# Patient Record
Sex: Male | Born: 1970
Health system: Southern US, Community
[De-identification: ages and names within clinical notes are randomized; demographics above are authoritative.]

## PROBLEM LIST (undated history)

## (undated) DIAGNOSIS — E119 Type 2 diabetes mellitus without complications: Secondary | ICD-10-CM

## (undated) DIAGNOSIS — I2510 Atherosclerotic heart disease of native coronary artery without angina pectoris: Secondary | ICD-10-CM

## (undated) DIAGNOSIS — F32A Depression, unspecified: Secondary | ICD-10-CM

## (undated) DIAGNOSIS — R945 Abnormal results of liver function studies: Secondary | ICD-10-CM

## (undated) DIAGNOSIS — Z8679 Personal history of other diseases of the circulatory system: Secondary | ICD-10-CM

## (undated) DIAGNOSIS — Z7901 Long term (current) use of anticoagulants: Secondary | ICD-10-CM

## (undated) DIAGNOSIS — I5020 Unspecified systolic (congestive) heart failure: Secondary | ICD-10-CM

## (undated) DIAGNOSIS — Z7982 Long term (current) use of aspirin: Secondary | ICD-10-CM

## (undated) DIAGNOSIS — I480 Paroxysmal atrial fibrillation: Secondary | ICD-10-CM

## (undated) DIAGNOSIS — D649 Anemia, unspecified: Secondary | ICD-10-CM

## (undated) DIAGNOSIS — Z87891 Personal history of nicotine dependence: Secondary | ICD-10-CM

## (undated) DIAGNOSIS — F419 Anxiety disorder, unspecified: Secondary | ICD-10-CM

## (undated) DIAGNOSIS — E0500 Thyrotoxicosis with diffuse goiter without thyrotoxic crisis or storm: Secondary | ICD-10-CM

## (undated) DIAGNOSIS — I447 Left bundle-branch block, unspecified: Secondary | ICD-10-CM

## (undated) DIAGNOSIS — K219 Gastro-esophageal reflux disease without esophagitis: Secondary | ICD-10-CM

## (undated) DIAGNOSIS — M2041 Other hammer toe(s) (acquired), right foot: Secondary | ICD-10-CM

## (undated) DIAGNOSIS — R0600 Dyspnea, unspecified: Secondary | ICD-10-CM

## (undated) DIAGNOSIS — G4700 Insomnia, unspecified: Secondary | ICD-10-CM

## (undated) DIAGNOSIS — E538 Deficiency of other specified B group vitamins: Secondary | ICD-10-CM

## (undated) DIAGNOSIS — R011 Cardiac murmur, unspecified: Secondary | ICD-10-CM

## (undated) DIAGNOSIS — I509 Heart failure, unspecified: Secondary | ICD-10-CM

## (undated) DIAGNOSIS — E079 Disorder of thyroid, unspecified: Secondary | ICD-10-CM

## (undated) DIAGNOSIS — E291 Testicular hypofunction: Secondary | ICD-10-CM

## (undated) DIAGNOSIS — Z79899 Other long term (current) drug therapy: Secondary | ICD-10-CM

## (undated) DIAGNOSIS — I700 Atherosclerosis of aorta: Secondary | ICD-10-CM

## (undated) DIAGNOSIS — J302 Other seasonal allergic rhinitis: Secondary | ICD-10-CM

## (undated) DIAGNOSIS — E039 Hypothyroidism, unspecified: Secondary | ICD-10-CM

## (undated) DIAGNOSIS — N1831 Chronic kidney disease, stage 3a: Secondary | ICD-10-CM

## (undated) DIAGNOSIS — I219 Acute myocardial infarction, unspecified: Secondary | ICD-10-CM

## (undated) DIAGNOSIS — T7840XA Allergy, unspecified, initial encounter: Secondary | ICD-10-CM

## (undated) DIAGNOSIS — M6740 Ganglion, unspecified site: Secondary | ICD-10-CM

## (undated) DIAGNOSIS — R570 Cardiogenic shock: Secondary | ICD-10-CM

## (undated) DIAGNOSIS — N529 Male erectile dysfunction, unspecified: Secondary | ICD-10-CM

## (undated) DIAGNOSIS — I493 Ventricular premature depolarization: Secondary | ICD-10-CM

## (undated) DIAGNOSIS — G4733 Obstructive sleep apnea (adult) (pediatric): Secondary | ICD-10-CM

## (undated) DIAGNOSIS — E785 Hyperlipidemia, unspecified: Secondary | ICD-10-CM

## (undated) DIAGNOSIS — I10 Essential (primary) hypertension: Secondary | ICD-10-CM

## (undated) DIAGNOSIS — I255 Ischemic cardiomyopathy: Secondary | ICD-10-CM

## (undated) DIAGNOSIS — I2109 ST elevation (STEMI) myocardial infarction involving other coronary artery of anterior wall: Secondary | ICD-10-CM

## (undated) DIAGNOSIS — G4730 Sleep apnea, unspecified: Secondary | ICD-10-CM

## (undated) DIAGNOSIS — I499 Cardiac arrhythmia, unspecified: Secondary | ICD-10-CM

## (undated) HISTORY — DX: Sleep apnea, unspecified: G47.30

## (undated) HISTORY — DX: Atherosclerotic heart disease of native coronary artery without angina pectoris: I25.10

## (undated) HISTORY — DX: Cardiac murmur, unspecified: R01.1

## (undated) HISTORY — DX: Gastro-esophageal reflux disease without esophagitis: K21.9

## (undated) HISTORY — DX: ST elevation (STEMI) myocardial infarction involving other coronary artery of anterior wall: I21.09

## (undated) HISTORY — DX: Allergy, unspecified, initial encounter: T78.40XA

## (undated) HISTORY — PX: CORONARY ANGIOPLASTY WITH STENT PLACEMENT: SHX49

## (undated) HISTORY — DX: Anxiety disorder, unspecified: F41.9

## (undated) HISTORY — PX: SKIN GRAFT: SHX250

## (undated) HISTORY — DX: Depression, unspecified: F32.A

## (undated) HISTORY — DX: Cardiac arrhythmia, unspecified: I49.9

## (undated) HISTORY — DX: Cardiogenic shock: R57.0

---

## 2001-12-25 DIAGNOSIS — I2119 ST elevation (STEMI) myocardial infarction involving other coronary artery of inferior wall: Secondary | ICD-10-CM

## 2001-12-25 HISTORY — DX: ST elevation (STEMI) myocardial infarction involving other coronary artery of inferior wall: I21.19

## 2012-03-15 ENCOUNTER — Emergency Department (HOSPITAL_COMMUNITY): Payer: BC Managed Care – PPO

## 2012-03-15 ENCOUNTER — Observation Stay (HOSPITAL_COMMUNITY)
Admission: EM | Admit: 2012-03-15 | Discharge: 2012-03-16 | Disposition: A | Discharge status: Home / Self Care | Payer: BC Managed Care – PPO | Attending: Internal Medicine | Admitting: Internal Medicine

## 2012-03-15 ENCOUNTER — Other Ambulatory Visit: Payer: Self-pay

## 2012-03-15 ENCOUNTER — Encounter (HOSPITAL_COMMUNITY): Payer: Self-pay | Admitting: Emergency Medicine

## 2012-03-15 DIAGNOSIS — F419 Anxiety disorder, unspecified: Secondary | ICD-10-CM | POA: Diagnosis present

## 2012-03-15 DIAGNOSIS — R0602 Shortness of breath: Secondary | ICD-10-CM | POA: Insufficient documentation

## 2012-03-15 DIAGNOSIS — I10 Essential (primary) hypertension: Secondary | ICD-10-CM | POA: Diagnosis present

## 2012-03-15 DIAGNOSIS — I472 Ventricular tachycardia, unspecified: Secondary | ICD-10-CM | POA: Diagnosis present

## 2012-03-15 DIAGNOSIS — R000 Tachycardia, unspecified: Secondary | ICD-10-CM

## 2012-03-15 DIAGNOSIS — I2510 Atherosclerotic heart disease of native coronary artery without angina pectoris: Secondary | ICD-10-CM | POA: Diagnosis present

## 2012-03-15 DIAGNOSIS — E0590 Thyrotoxicosis, unspecified without thyrotoxic crisis or storm: Secondary | ICD-10-CM | POA: Diagnosis present

## 2012-03-15 DIAGNOSIS — I252 Old myocardial infarction: Secondary | ICD-10-CM | POA: Insufficient documentation

## 2012-03-15 DIAGNOSIS — F411 Generalized anxiety disorder: Secondary | ICD-10-CM | POA: Insufficient documentation

## 2012-03-15 DIAGNOSIS — F39 Unspecified mood [affective] disorder: Secondary | ICD-10-CM | POA: Insufficient documentation

## 2012-03-15 DIAGNOSIS — I25119 Atherosclerotic heart disease of native coronary artery with unspecified angina pectoris: Secondary | ICD-10-CM | POA: Diagnosis present

## 2012-03-15 DIAGNOSIS — F29 Unspecified psychosis not due to a substance or known physiological condition: Secondary | ICD-10-CM | POA: Insufficient documentation

## 2012-03-15 HISTORY — DX: Tachycardia, unspecified: R00.0

## 2012-03-15 HISTORY — DX: Essential (primary) hypertension: I10

## 2012-03-15 HISTORY — DX: Acute myocardial infarction, unspecified: I21.9

## 2012-03-15 LAB — POCT I-STAT, CHEM 8
Creatinine, Ser: 0.8 mg/dL (ref 0.50–1.35)
Glucose, Bld: 162 mg/dL — ABNORMAL HIGH (ref 70–99)
HCT: 45 % (ref 39.0–52.0)
Hemoglobin: 15.3 g/dL (ref 13.0–17.0)
TCO2: 27 mmol/L (ref 0–100)

## 2012-03-15 LAB — CBC
Hemoglobin: 15.1 g/dL (ref 13.0–17.0)
MCH: 29.4 pg (ref 26.0–34.0)
MCHC: 35.4 g/dL (ref 30.0–36.0)
MCV: 82.9 fL (ref 78.0–100.0)
Platelets: 168 10*3/uL (ref 150–400)
RBC: 5.14 MIL/uL (ref 4.22–5.81)

## 2012-03-15 LAB — DIFFERENTIAL
Basophils Relative: 0 % (ref 0–1)
Eosinophils Absolute: 0.1 10*3/uL (ref 0.0–0.7)
Eosinophils Relative: 2 % (ref 0–5)
Lymphs Abs: 2.5 10*3/uL (ref 0.7–4.0)
Monocytes Relative: 10 % (ref 3–12)

## 2012-03-15 LAB — HEPATIC FUNCTION PANEL
Bilirubin, Direct: 0.1 mg/dL (ref 0.0–0.3)
Indirect Bilirubin: 0.3 mg/dL (ref 0.3–0.9)
Total Bilirubin: 0.4 mg/dL (ref 0.3–1.2)

## 2012-03-15 MED ORDER — SODIUM CHLORIDE 0.9 % IV SOLN: INTRAVENOUS | Status: DC

## 2012-03-15 MED ORDER — SODIUM CHLORIDE 0.9 % IV SOLN
Freq: Once | INTRAVENOUS | Status: AC
  Administered 2012-03-15: 20:00:00 via INTRAVENOUS

## 2012-03-15 MED ORDER — METOPROLOL TARTRATE 1 MG/ML IV SOLN
5.0000 mg | Freq: Once | INTRAVENOUS | Status: AC
  Administered 2012-03-16: 5 mg via INTRAVENOUS
  Filled 2012-03-15: qty 5

## 2012-03-15 NOTE — ED Notes (Signed)
Pt to xray with tech

## 2012-03-15 NOTE — ED Notes (Signed)
States onset of tremors worsening overtime with anxiety sweating and nausea. Seen Doctor abnormal thyroid levels sent to ED for evaluation.  Ax4.

## 2012-03-15 NOTE — ED Provider Notes (Signed)
6:33 PM   Patient had been having tremors, tachycardia, palpitations. He was initially seen by a primary doctor, who thought he was having anxiety. He was then seen at [facility] and had thyroid function studies done. This showed a very low TSH. Also, showed a significantly elevated free T3. Patient was apparently referred to an endocrinologist.  He has been having worsening anxiety, nausea, increased thyroid size.  Into new palpitations, shortness of breath. Was sent in for further evaluation.  Merritt Mccravy A. Patrica Duel, MD 03/15/12 3086

## 2012-03-15 NOTE — ED Provider Notes (Signed)
History     CSN: [id number]  Arrival date & time 03/15/12  1653   First MD Initiated Contact with Patient 03/15/12 1824      Chief Complaint  Patient presents with  . Tremors    (Consider location/radiation/quality/duration/timing/severity/associated sxs/prior treatment) The history is provided by the patient and the spouse.   Recent thyroid studies form PCM consistent with hyperthyroid. His symptoms for the past year and worse this month also consistent with same. Patient with tremors, SOB, palpitations, sweats, decreased sleep, and confusion all worse this month. Also with fullness to neck. Referred to Endocinology but they are not able to see him for 1-2 months and they referred him here.   Past Medical History  Diagnosis Date  . MI (myocardial infarction)   . Hypertension     History reviewed. No pertinent past surgical history.  History reviewed. No pertinent family history.  History  Substance Use Topics  . Smoking status: Never Smoker   . Smokeless tobacco: Never Used  . Alcohol Use: No      Review of Systems  Constitutional: Positive for fever, diaphoresis and appetite change.  HENT: Positive for trouble swallowing and neck pain. Negative for congestion, facial swelling and voice change.   Eyes: Negative for visual disturbance.  Respiratory: Positive for shortness of breath. Negative for cough.   Cardiovascular: Positive for palpitations. Negative for chest pain.  Gastrointestinal: Positive for abdominal pain. Negative for nausea and vomiting.  Genitourinary: Negative for dysuria and hematuria.  Musculoskeletal: Negative for back pain and joint swelling.  Skin: Negative for rash.  Neurological: Positive for tremors.  Hematological: Does not bruise/bleed easily.  Psychiatric/Behavioral: Positive for confusion and sleep disturbance.    Allergies  Review of patient's allergies indicates no known allergies.  Home Medications   No current outpatient  prescriptions on file.  BP 116/67  Pulse 96  Temp(Src) 97.8 F (36.6 C) (Oral)  Resp 20  Ht 6' (1.829 m)  Wt 200 lb (90.719 kg)  BMI 27.12 kg/m2  SpO2 96%  Physical Exam  Nursing note and vitals reviewed. Constitutional: He is oriented to person, place, and time. He appears well-developed and well-nourished.  HENT:  Head: Normocephalic and atraumatic.  Mouth/Throat: Oropharynx is clear and moist.       Thyroid fullness, no adenopathy  Eyes: Conjunctivae and EOM are normal. Pupils are equal, round, and reactive to light. Right eye exhibits no discharge.  Neck: Normal range of motion. Neck supple. No tracheal deviation present. Thyromegaly present.  Cardiovascular: Normal rate, regular rhythm, normal heart sounds and intact distal pulses.   No murmur heard.      tachy  Pulmonary/Chest: Effort normal and breath sounds normal. No stridor. No respiratory distress. He has no wheezes. He has no rales.  Abdominal: Soft. Bowel sounds are normal. There is no tenderness.  Musculoskeletal: Normal range of motion. He exhibits no edema.  Lymphadenopathy:    He has no cervical adenopathy.  Neurological: He is alert and oriented to person, place, and time. No cranial nerve deficit. He exhibits normal muscle tone. Coordination normal.       Tremor agitated  Skin: Skin is warm. No rash noted. He is not diaphoretic. No erythema.    ED Course  Procedures (including critical care time)  Labs Reviewed  T3, FREE - Abnormal; Notable for the following:    T3, Free >20.0 (*)    All other components within normal limits  T4, FREE - Abnormal; Notable for the following:  Free T4 5.34 (*)    All other components within normal limits  TSH - Abnormal; Notable for the following:    TSH <0.008 (*)    All other components within normal limits  DIFFERENTIAL - Abnormal; Notable for the following:    Neutrophils Relative 42 (*)    Lymphocytes Relative 47 (*)    All other components within normal limits   POCT I-STAT, CHEM 8 - Abnormal; Notable for the following:    Glucose, Bld 162 (*)    All other components within normal limits  HEPATIC FUNCTION PANEL - Abnormal; Notable for the following:    ALT 79 (*)    Alkaline Phosphatase 171 (*)    All other components within normal limits  BASIC METABOLIC PANEL - Abnormal; Notable for the following:    Glucose, Bld 107 (*)    All other components within normal limits  CBC - Abnormal; Notable for the following:    HCT 38.9 (*)    All other components within normal limits  CBC  CARDIAC PANEL(CRET KIN+CKTOT+MB+TROPI)  CARDIAC PANEL(CRET KIN+CKTOT+MB+TROPI)  CARDIAC PANEL(CRET KIN+CKTOT+MB+TROPI)   Dg Chest 2 View  03/15/2012  *RADIOLOGY REPORT*  Clinical Data: Tremors, weakness, history hypertension  CHEST - 2 VIEW  Comparison: None.  Findings: Normal heart size, mediastinal contours, and pulmonary vascularity. Lungs clear. Bones unremarkable. No pneumothorax.  IMPRESSION: No acute abnormalities.  Original Report Authenticated By: Lollie Marrow, M.D.   US Soft Tissue Head/neck  03/16/2012  *RADIOLOGY REPORT*  Clinical Data: Hyperthyroidism, evaluate for nodule  THYROID ULTRASOUND  Technique: Ultrasound examination of the thyroid gland and adjacent soft tissues was performed.  Comparison:  None.  Findings:  Right thyroid lobe:  Enlarged, measuring 5.9 x 2.9 x 2.9 cm. Hypervascular. Left thyroid lobe:  Measures 4.4 x 2.2 x 2.1 cm.  Hypervascular. Isthmus:  Measures 6 mm in thickness.  Focal nodules:  2.3 x 1.4 x 1.9 cm solid nodule in the right isthmus.  Two smaller subcentimeter nodules in the left isthmus.  Lymphadenopathy:  None visualized.  IMPRESSION: Enlarged, hypervascular thyroid gland.  2.3 cm solid nodule in the right isthmus.  Percutaneous sampling is suggested.  This recommendation follows the consensus statement:  Management of Thyroid Nodules Detected as [place]:  Society of Radiologists in Ultrasound Consensus Conference Statement.  Radiology  2005; 237:794- 800.  Original Report Authenticated By: Charline Bills, M.D.   Results for orders placed during the hospital encounter of 03/15/12  T3, FREE      Component Value Range   T3, Free >20.0 (*) 2.3 - 4.2 (pg/mL)  T4, FREE      Component Value Range   Free T4 5.34 (*) 0.80 - 1.80 (ng/dL)  TSH      Component Value Range   TSH <0.008 (*) 0.350 - 4.500 (uIU/mL)  CBC      Component Value Range   WBC 5.4  4.0 - 10.5 (K/uL)   RBC 5.14  4.22 - 5.81 (MIL/uL)   Hemoglobin 15.1  13.0 - 17.0 (g/dL)   HCT 16.1  09.6 - 04.5 (%)   MCV 82.9  78.0 - 100.0 (fL)   MCH 29.4  26.0 - 34.0 (pg)   MCHC 35.4  30.0 - 36.0 (g/dL)   RDW 40.9  81.1 - 91.4 (%)   Platelets 168  150 - 400 (K/uL)  DIFFERENTIAL      Component Value Range   Neutrophils Relative 42 (*) 43 - 77 (%)   Neutro Abs 2.2  1.7 -  7.7 (K/uL)   Lymphocytes Relative 47 (*) 12 - 46 (%)   Lymphs Abs 2.5  0.7 - 4.0 (K/uL)   Monocytes Relative 10  3 - 12 (%)   Monocytes Absolute 0.5  0.1 - 1.0 (K/uL)   Eosinophils Relative 2  0 - 5 (%)   Eosinophils Absolute 0.1  0.0 - 0.7 (K/uL)   Basophils Relative 0  0 - 1 (%)   Basophils Absolute 0.0  0.0 - 0.1 (K/uL)  POCT I-STAT, CHEM 8      Component Value Range   Sodium 141  135 - 145 (mEq/L)   Potassium 4.2  3.5 - 5.1 (mEq/L)   Chloride 103  96 - 112 (mEq/L)   BUN 15  6 - 23 (mg/dL)   Creatinine, Ser 1.61  0.50 - 1.35 (mg/dL)   Glucose, Bld 096 (*) 70 - 99 (mg/dL)   Calcium, Ion 0.45  4.09 - 1.32 (mmol/L)   TCO2 27  0 - 100 (mmol/L)   Hemoglobin 15.3  13.0 - 17.0 (g/dL)   HCT 81.1  91.4 - 78.2 (%)  HEPATIC FUNCTION PANEL      Component Value Range   Total Protein 6.7  6.0 - 8.3 (g/dL)   Albumin 3.5  3.5 - 5.2 (g/dL)   AST 36  0 - 37 (U/L)   ALT 79 (*) 0 - 53 (U/L)   Alkaline Phosphatase 171 (*) 39 - 117 (U/L)   Total Bilirubin 0.4  0.3 - 1.2 (mg/dL)   Bilirubin, Direct 0.1  0.0 - 0.3 (mg/dL)   Indirect Bilirubin 0.3  0.3 - 0.9 (mg/dL)  CARDIAC PANEL(CRET KIN+CKTOT+MB+TROPI)       Component Value Range   Total CK 55  7 - 232 (U/L)   CK, MB 2.0  0.3 - 4.0 (ng/mL)   Troponin I <0.30  <0.30 (ng/mL)   Relative Index RELATIVE INDEX IS INVALID  0.0 - 2.5   CARDIAC PANEL(CRET KIN+CKTOT+MB+TROPI)      Component Value Range   Total CK 49  7 - 232 (U/L)   CK, MB 1.9  0.3 - 4.0 (ng/mL)   Troponin I <0.30  <0.30 (ng/mL)   Relative Index RELATIVE INDEX IS INVALID  0.0 - 2.5   CARDIAC PANEL(CRET KIN+CKTOT+MB+TROPI)      Component Value Range   Total CK 51  7 - 232 (U/L)   CK, MB 1.9  0.3 - 4.0 (ng/mL)   Troponin I <0.30  <0.30 (ng/mL)   Relative Index RELATIVE INDEX IS INVALID  0.0 - 2.5   BASIC METABOLIC PANEL      Component Value Range   Sodium 139  135 - 145 (mEq/L)   Potassium 4.3  3.5 - 5.1 (mEq/L)   Chloride 104  96 - 112 (mEq/L)   CO2 26  19 - 32 (mEq/L)   Glucose, Bld 107 (*) 70 - 99 (mg/dL)   BUN 19  6 - 23 (mg/dL)   Creatinine, Ser 9.56  0.50 - 1.35 (mg/dL)   Calcium 9.5  8.4 - 21.3 (mg/dL)   GFR calc non Af Amer >90  >90 (mL/min)   GFR calc Af Amer >90  >90 (mL/min)  CBC      Component Value Range   WBC 5.8  4.0 - 10.5 (K/uL)   RBC 4.66  4.22 - 5.81 (MIL/uL)   Hemoglobin 13.6  13.0 - 17.0 (g/dL)   HCT 08.6 (*) 57.8 - 52.0 (%)   MCV 83.5  78.0 - 100.0 (fL)  MCH 29.2  26.0 - 34.0 (pg)   MCHC 35.0  30.0 - 36.0 (g/dL)   RDW 01.0  27.2 - 53.6 (%)   Platelets 165  150 - 400 (K/uL)    Date: 03/15/2012  Rate: 112  Rhythm: sinus tachycardia  QRS Axis: normal  Intervals: normal  ST/T Wave abnormalities: normal  Conduction Disutrbances:none  Narrative Interpretation:   Old EKG Reviewed: none available    1. Hyperthyroidism       MDM   Thyroid studies form 3/20 consistent with hyperthyroidism and by hx gradually developing for one year and worse this month. Currently not in tyroid storm but definitely showing signs of some toxicity. Not febrile, mild tachycardia, tremors, borderline hypertnesion. But he notes heart rates as high as 180 and  initial BP was 171/107. Not able to get into Endocrinology so was sent here. [facility] will admit. Since improving beta blocker not started in ED.         Shelda Jakes, MD 03/16/12 [phone]

## 2012-03-15 NOTE — ED Notes (Signed)
Pt reports palpitations on and off for the last several years. Two weeks ago while patient was exerting himself he felt his heart racing up to 170bpm. Seen by PMD and dx with anxiety. Pt with neck swelling and constant tremors since approx 2 weeks. Pt pale with generalized tremors in hands.

## 2012-03-15 NOTE — H&P (Signed)
History and Physical       Hospital Admission Note Date: 03/15/2012  Patient name: Bobby [name] Medical record number: [id number] Date of birth: 05-02-1971 Age: 41 y.o. Gender: male  PCP: [facility], Dr. Ludwig Clarks  Attending physician: Antonieta Pert, MD   Chief Complaint:  Tachycardia with heart rate in the 120s to 170s, shaking, for last 6 months worse today  HPI: 41 year old Caucasian male with history of CAD with stent placement, hypertension presented to [name] ED sent by his primary care physician's office. Patient states that he started having tremors and shakiness for last 6 months with anxiety, tachycardia, losing weight, he had been eating significantly. Initially his PCP thought he was having anxiety disorder hence was started on Lexapro and some anxiety panel which did not improve his symptoms. Patient states that he went to the gym where his heart rate would sometimes go up to 200. He has lost 10 pounds in the last 2 weeks. Patient stated that he was unable to function because of palpitations and tremors. His PCP checked his thyroid function test and the labs came back today, his TSH was less than 0.01, free T4 of 4.13, T3 22.69. Patient was told that he needed an endocrinologist referral asap but he was unable to get an appointment until April. Patient was then advised to go to the ED. In the ED, patient's heart rate was noted to be 125, BP of 171/107, hospitalist service was requested for admission.  Review of Systems:  Constitutional: Denies fever, chills, patient admits to having diaphoresis and fatigue.  HEENT: Denies photophobia, eye pain, redness, hearing loss, ear pain, congestion, sore throat, rhinorrhea, sneezing, mouth sores, trouble swallowing, neck pain, neck stiffness and tinnitus.   Respiratory: Denies SOB, DOE, cough, chest tightness,  and wheezing.   Cardiovascular: Denies chest pain and leg  swelling.  he endorses having palpitations Gastrointestinal: Denies nausea, vomiting, abdominal pain, diarrhea, constipation, blood in stool and abdominal distention.  patient states that he has been eating a lot, until he falls asleep, however still losing weight and has lost 10 pounds in the last 2 weeks. Genitourinary: Denies dysuria, urgency, frequency, hematuria, flank pain and difficulty urinating.  Musculoskeletal: Denies myalgias, back pain, joint swelling, arthralgias and gait problem.  Skin: Denies pallor, rash and wound.  Neurological: Denies dizziness, seizures, syncope, weakness, light-headedness, numbness and headaches.  appears tremulous Hematological: Denies adenopathy. Easy bruising, personal or family bleeding history  Psychiatric/Behavioral: Denies suicidal ideation, mood changes, confusion, but he admits to having nervousness and anxiety  Past Medical History: Past Medical History  Diagnosis Date  . MI (myocardial infarction)   . Hypertension    History reviewed. No pertinent past surgical history.  Medications: Prior to Admission medications   Medication Sig Start Date End Date Taking? Authorizing Provider  cetirizine (ZYRTEC) 10 MG tablet Take 10 mg by mouth daily.   Yes Historical Provider, MD  lisinopril (PRINIVIL,ZESTRIL) 10 MG tablet Take 10 mg by mouth daily.   Yes Historical Provider, MD  metoprolol succinate (TOPROL-XL) 100 MG 24 hr tablet Take 100 mg by mouth daily. Take with or immediately following a meal.   Yes Historical Provider, MD  montelukast (SINGULAIR) 10 MG tablet Take 10 mg by mouth at bedtime.   Yes Historical Provider, MD  pantoprazole (PROTONIX) 40 MG tablet Take 40 mg by mouth daily.   Yes Historical Provider, MD  saccharomyces boulardii (FLORASTOR) 250 MG capsule Take 250 mg by mouth 2 (two) times daily.  Yes Historical Provider, MD    Allergies:  No Known Allergies  Social History:  reports that he has never smoked. He does not have any  smokeless tobacco history on file. He reports that he does not drink alcohol or use illicit drugs.  Family History: No family history on file.  Physical Exam: Blood pressure 145/81, pulse 112, temperature 98.1 F (36.7 C), resp. rate 19, SpO2 100.00%. General: Alert, awake, oriented x3, in no acute distress. Anxious HEENT: anicteric sclera, pink conjunctiva, pupils equal and reactive to light and accomodation Neck: supple, no masses or lymphadenopathy, no goiter, no bruits  Heart: Tachycardia Regular rate and rhythm, without murmurs, rubs or gallops. Lungs: Clear to auscultation bilaterally, no wheezing, rales or rhonchi. Abdomen: Soft, nontender, nondistended, positive bowel sounds, no masses. Extremities: No clubbing, cyanosis or edema with positive pedal pulses, tremors. Neuro: Grossly intact, no focal neurological deficits, strength 5/5 upper and lower extremities bilaterally Psych: alert and oriented x 3, anxious  Skin: no rashes or lesions, warm and dry   LABS on Admission:  Basic Metabolic Panel:  Lab 03/15/12 1610  NA 141  K 4.2  CL 103  CO2 --  GLUCOSE 162*  BUN 15  CREATININE 0.80  CALCIUM --  MG --  PHOS --   Liver Function Tests:  Lab 03/15/12 2217  AST 36  ALT 79*  ALKPHOS 171*  BILITOT 0.4  PROT 6.7  ALBUMIN 3.5   CBC:  Lab 03/15/12 1834 03/15/12 1813  WBC -- 5.4  NEUTROABS -- 2.2  HGB 15.3 15.1  HCT 45.0 42.6  MCV -- 82.9  PLT -- 168    Radiological Exams on Admission: Dg Chest 2 View  03/15/2012  *RADIOLOGY REPORT*  Clinical Data: Tremors, weakness, history hypertension  CHEST - 2 VIEW  Comparison: None.  Findings: Normal heart size, mediastinal contours, and pulmonary vascularity. Lungs clear. Bones unremarkable. No pneumothorax.  IMPRESSION: No acute abnormalities.  Original Report Authenticated By: Lollie Marrow, M.D.    Assessment/Plan Present on Admission:  .Hyperthyroidism: - Clearly patient's symptoms are consistent with  hyperthyroidism, although he does not appear to be in thyroid storm.  - He needs thyroid ultrasound, given the the thyroid results are consistent with hyperthyroidism, I will start him on Tapazole until he is able to see an endocrinologist. He will need repeat TSH, free T4, T3 in 4-6 weeks   .Tachycardia: Likely secondary to hyperthyroidism  - I will start him on atenolol, give him one dose of IV metoprolol, 2-D echo to rule out hyperthyroidism induced cardiomyopathy   .CAD (coronary artery disease) - Continue lisinopril, beta blocker   .HTN (hypertension): Continue lisinopril and beta blocker   .Anxiety: I have placed him on low-dose Xanax until his symptoms resolve  DVT prophylaxis: SCDs  CODE STATUS: Full code  Further plan will depend as patient's clinical course evolves and further radiologic and laboratory data become available.   @Time  Spent on Admission: 1 hour Bobby [name] M.D. [facility] 03/15/2012, 11:28 PM

## 2012-03-16 ENCOUNTER — Encounter (HOSPITAL_COMMUNITY): Payer: Self-pay | Admitting: *Deleted

## 2012-03-16 ENCOUNTER — Observation Stay (HOSPITAL_COMMUNITY): Payer: BC Managed Care – PPO

## 2012-03-16 LAB — CARDIAC PANEL(CRET KIN+CKTOT+MB+TROPI)
CK, MB: 1.9 ng/mL (ref 0.3–4.0)
Relative Index: INVALID (ref 0.0–2.5)
Troponin I: <0.3 ng/mL (ref <0.30)
Troponin I: <0.3 ng/mL (ref <0.30)
Troponin I: <0.3 ng/mL (ref <0.30)

## 2012-03-16 LAB — BASIC METABOLIC PANEL
BUN: 19 mg/dL (ref 6–23)
Chloride: 104 mEq/L (ref 96–112)
GFR calc Af Amer: >90 mL/min (ref >90)
Glucose, Bld: 107 mg/dL — ABNORMAL HIGH (ref 70–99)
Potassium: 4.3 mEq/L (ref 3.5–5.1)

## 2012-03-16 LAB — T4, FREE: Free T4: 5.34 ng/dL — ABNORMAL HIGH (ref 0.80–1.80)

## 2012-03-16 LAB — CBC
HCT: 38.9 % — ABNORMAL LOW (ref 39.0–52.0)
Hemoglobin: 13.6 g/dL (ref 13.0–17.0)
MCHC: 35 g/dL (ref 30.0–36.0)

## 2012-03-16 LAB — T3, FREE: T3, Free: >20 pg/mL — ABNORMAL HIGH (ref 2.3–4.2)

## 2012-03-16 MED ORDER — ONDANSETRON HCL 4 MG/2ML IJ SOLN: 4.0000 mg | Freq: Four times a day (QID) | INTRAMUSCULAR | Status: DC | PRN

## 2012-03-16 MED ORDER — METHIMAZOLE 10 MG PO TABS: 10.0000 mg | ORAL_TABLET | Freq: Every day | ORAL | Status: AC

## 2012-03-16 MED ORDER — ALUM & MAG HYDROXIDE-SIMETH 200-200-20 MG/5ML PO SUSP: 30.0000 mL | Freq: Four times a day (QID) | ORAL | Status: DC | PRN

## 2012-03-16 MED ORDER — ACETAMINOPHEN 325 MG PO TABS: 650.0000 mg | ORAL_TABLET | Freq: Four times a day (QID) | ORAL | Status: DC | PRN

## 2012-03-16 MED ORDER — ATENOLOL 100 MG PO TABS
100.0000 mg | ORAL_TABLET | Freq: Every day | ORAL | Status: DC
  Administered 2012-03-16: 100 mg via ORAL
  Filled 2012-03-16: qty 1

## 2012-03-16 MED ORDER — SODIUM CHLORIDE 0.9 % IV SOLN
INTRAVENOUS | Status: DC
  Administered 2012-03-16: 02:00:00 via INTRAVENOUS

## 2012-03-16 MED ORDER — ONDANSETRON HCL 4 MG PO TABS: 4.0000 mg | ORAL_TABLET | Freq: Four times a day (QID) | ORAL | Status: DC | PRN

## 2012-03-16 MED ORDER — ALPRAZOLAM 0.5 MG PO TABS
0.5000 mg | ORAL_TABLET | Freq: Every morning | ORAL | Status: DC
  Administered 2012-03-16: 0.5 mg via ORAL
  Filled 2012-03-16: qty 1

## 2012-03-16 MED ORDER — MONTELUKAST SODIUM 10 MG PO TABS
10.0000 mg | ORAL_TABLET | Freq: Every day | ORAL | Status: DC
  Administered 2012-03-16: 10 mg via ORAL
  Filled 2012-03-16 (×2): qty 1

## 2012-03-16 MED ORDER — ALPRAZOLAM 0.5 MG PO TABS: 0.5000 mg | ORAL_TABLET | Freq: Three times a day (TID) | ORAL | Status: AC | PRN

## 2012-03-16 MED ORDER — METHIMAZOLE 10 MG PO TABS
10.0000 mg | ORAL_TABLET | Freq: Every day | ORAL | Status: DC
  Administered 2012-03-16: 10 mg via ORAL
  Filled 2012-03-16: qty 1

## 2012-03-16 MED ORDER — HYDROCODONE-ACETAMINOPHEN 5-325 MG PO TABS: 1.0000 | ORAL_TABLET | ORAL | Status: DC | PRN

## 2012-03-16 MED ORDER — ATENOLOL 100 MG PO TABS: 100.0000 mg | ORAL_TABLET | Freq: Every day | ORAL | Status: AC

## 2012-03-16 MED ORDER — HYDROMORPHONE HCL PF 1 MG/ML IJ SOLN: 1.0000 mg | INTRAMUSCULAR | Status: DC | PRN

## 2012-03-16 MED ORDER — LISINOPRIL 10 MG PO TABS
10.0000 mg | ORAL_TABLET | Freq: Every day | ORAL | Status: DC
  Administered 2012-03-16 (×2): 10 mg via ORAL
  Filled 2012-03-16 (×2): qty 1

## 2012-03-16 MED ORDER — ACETAMINOPHEN 650 MG RE SUPP: 650.0000 mg | Freq: Four times a day (QID) | RECTAL | Status: DC | PRN

## 2012-03-16 MED ORDER — PANTOPRAZOLE SODIUM 40 MG PO TBEC
40.0000 mg | DELAYED_RELEASE_TABLET | Freq: Every day | ORAL | Status: DC
  Administered 2012-03-16: 40 mg via ORAL
  Filled 2012-03-16: qty 1

## 2012-03-16 MED ORDER — LORATADINE 10 MG PO TABS
10.0000 mg | ORAL_TABLET | Freq: Every day | ORAL | Status: DC
  Administered 2012-03-16: 10 mg via ORAL
  Filled 2012-03-16: qty 1

## 2012-03-16 MED ORDER — SACCHAROMYCES BOULARDII 250 MG PO CAPS
250.0000 mg | ORAL_CAPSULE | Freq: Two times a day (BID) | ORAL | Status: DC
  Administered 2012-03-16 (×2): 250 mg via ORAL
  Filled 2012-03-16 (×3): qty 1

## 2012-03-16 NOTE — Progress Notes (Signed)
  Echocardiogram 2D Echocardiogram has been performed.  Burris Matherne, Real Cons 03/16/2012, 2:20 PM

## 2012-03-16 NOTE — Discharge Summary (Signed)
Patient ID: Bobby [name] MRN: [id number] DOB/AGE: 09/02/1971 41 y.o. Primary Care Physician:KAPLAN,KRISTEN, PA, PA-C Admit date: 03/15/2012 Discharge date: 03/18/2012    Discharge Diagnoses:   Principal Problem:  *Tachycardia Active Problems:  Hyperthyroidism  CAD (coronary artery disease)  HTN (hypertension)  Anxiety Thyroid nodule - patient is scheduled for nuclear medicine thyroid iodine scan on 03/25/12  Medication List  As of 03/18/2012  6:10 PM   START taking these medications         ALPRAZolam 0.5 MG tablet   Commonly known as: XANAX   Take 1 tablet (0.5 mg total) by mouth 3 (three) times daily as needed for sleep or anxiety.      atenolol 100 MG tablet   Commonly known as: TENORMIN   Take 1 tablet (100 mg total) by mouth daily.      methimazole 10 MG tablet   Commonly known as: TAPAZOLE   Take 1 tablet (10 mg total) by mouth daily.         CONTINUE taking these medications         cetirizine 10 MG tablet   Commonly known as: ZYRTEC      lisinopril 10 MG tablet   Commonly known as: PRINIVIL,ZESTRIL      montelukast 10 MG tablet   Commonly known as: SINGULAIR      pantoprazole 40 MG tablet   Commonly known as: PROTONIX      saccharomyces boulardii 250 MG capsule   Commonly known as: FLORASTOR         STOP taking these medications         metoprolol succinate 100 MG 24 hr tablet          Where to get your medications    These are the prescriptions that you need to pick up.   You may get these medications from any pharmacy.         ALPRAZolam 0.5 MG tablet   atenolol 100 MG tablet   methimazole 10 MG tablet            Discharged Condition: Slightly improved    Consults: None  Significant Diagnostic Studies: Dg Chest 2 View  03/15/2012  *RADIOLOGY REPORT*  Clinical Data: Tremors, weakness, history hypertension  CHEST - 2 VIEW  Comparison: None.  Findings: Normal heart size, mediastinal contours, and pulmonary vascularity. Lungs clear.  Bones unremarkable. No pneumothorax.  IMPRESSION: No acute abnormalities.  Original Report Authenticated By: Lollie Marrow, M.D.   US Soft Tissue Head/neck  03/16/2012  *RADIOLOGY REPORT*  Clinical Data: Hyperthyroidism, evaluate for nodule  THYROID ULTRASOUND  Technique: Ultrasound examination of the thyroid gland and adjacent soft tissues was performed.  Comparison:  None.  Findings:  Right thyroid lobe:  Enlarged, measuring 5.9 x 2.9 x 2.9 cm. Hypervascular. Left thyroid lobe:  Measures 4.4 x 2.2 x 2.1 cm.  Hypervascular. Isthmus:  Measures 6 mm in thickness.  Focal nodules:  2.3 x 1.4 x 1.9 cm solid nodule in the right isthmus.  Two smaller subcentimeter nodules in the left isthmus.  Lymphadenopathy:  None visualized.  IMPRESSION: Enlarged, hypervascular thyroid gland.  2.3 cm solid nodule in the right isthmus.  Percutaneous sampling is suggested.  This recommendation follows the consensus statement:  Management of Thyroid Nodules Detected as [place]:  Society of Radiologists in Ultrasound Consensus Conference Statement.  Radiology 2005; 237:794- 800.  Original Report Authenticated By: Charline Bills, M.D.  Results for BRADY, SCHILLER (MRN [id number]) as of 03/18/2012 18:11  Ref. Range  03/15/2012 18:13  TSH Latest Range: 0.350-4.500 uIU/mL <0.008 (L)  Free T4 Latest Range: 0.80-1.80 ng/dL 9.60 (H)  T3, Free Latest Range: 2.3-4.2 pg/mL >20.0 (H)    Lab Results: Results for REFOEL, PALLADINO (MRN [id number]) as of 03/18/2012 18:11  Ref. Range 03/16/2012 06:50  Sodium Latest Range: 135-145 mEq/L 139  Potassium Latest Range: 3.5-5.1 mEq/L 4.3  Chloride Latest Range: 96-112 mEq/L 104  CO2 Latest Range: 19-32 mEq/L 26  BUN Latest Range: 6-23 mg/dL 19  Creat Latest Range: 0.50-1.35 mg/dL 1.47  Calcium Latest Range: 8.4-10.5 mg/dL 9.5  GFR calc non Af Amer Latest Range: >90 mL/min >90  GFR calc Af Amer Latest Range: >90 mL/min >90  Glucose Latest Range: 70-99 mg/dL 829 (H)  CK, MB Latest Range: 0.3-4.0 ng/mL 1.9    CK Total Latest Range: 7-232 U/L 49  Troponin I Latest Range: <0.30 ng/mL <0.30  WBC Latest Range: 4.0-10.5 K/uL 5.8  RBC Latest Range: 4.22-5.81 MIL/uL 4.66  HGB Latest Range: 13.0-17.0 g/dL 56.2  HCT Latest Range: 39.0-52.0 % 38.9 (L)  MCV Latest Range: 78.0-100.0 fL 83.5  MCH Latest Range: 26.0-34.0 pg 29.2  MCHC Latest Range: 30.0-36.0 g/dL 13.0  RDW Latest Range: 11.5-15.5 % 13.2  Platelets Latest Range: 150-400 K/uL 165   Hospital Course:  41 year old gentleman gentleman, admitted from the emergency room with tremors and tachycardia. He was found to have an extremely low TSH, elevated free T4 level suggestive of newly diagnosed hyperthyroidism. A thyroid ultrasound indicated presence of a nodule. The patient was very anxious to leave and go home and we started treatment with beta blockers and anxiolytics. The patient also received methimazole on March 22.  Followup will be happening with Dr. Lucianne Muss, who recommended holding the methimazole until the patient can get a thyroid scan.   Discharge Exam: Blood pressure 116/67, pulse 96, temperature 97.8 F (36.6 C), temperature source Oral, resp. rate 20, height 6' (1.829 m), weight 90.719 kg (200 lb), SpO2 96.00%. Alert and oriented x3 Cvs: rrr Rs: ctab  Abdomen : soft ,nt  Disposition: home   Discharge Orders    Future Appointments: Provider: Department: Dept Phone: Center:   03/25/2012 1:00 PM [facility] [phone] [place]   03/26/2012 1:00 PM [facility] [phone] [place]     Future Orders Please Complete By Expires   Diet general      Increase activity slowly       Follow up   Dr. Reather Littler with [facility] Endocrinology    Signed: Lonia Blood 03/18/2012, 6:10 PM

## 2012-03-16 NOTE — Progress Notes (Signed)
[name] to be D/C'd Home per MD order.  Discussed with the patient and all questions fully answered.   Ermon, Sagan  Home Medication Instructions ZOX:[id number]   Printed on:03/16/12 1504  Medication Information                    lisinopril (PRINIVIL,ZESTRIL) 10 MG tablet Take 10 mg by mouth daily.           saccharomyces boulardii (FLORASTOR) 250 MG capsule Take 250 mg by mouth 2 (two) times daily.           pantoprazole (PROTONIX) 40 MG tablet Take 40 mg by mouth daily.           montelukast (SINGULAIR) 10 MG tablet Take 10 mg by mouth at bedtime.           cetirizine (ZYRTEC) 10 MG tablet Take 10 mg by mouth daily.           ALPRAZolam (XANAX) 0.5 MG tablet Take 1 tablet (0.5 mg total) by mouth 3 (three) times daily as needed for sleep or anxiety.           atenolol (TENORMIN) 100 MG tablet Take 1 tablet (100 mg total) by mouth daily.           methimazole (TAPAZOLE) 10 MG tablet Take 1 tablet (10 mg total) by mouth daily.             VVS, Skin clean, dry and intact without evidence of skin break down, no evidence of skin tears noted. IV catheter discontinued intact. Site without signs and symptoms of complications. Dressing and pressure applied.  An After Visit Summary was printed and given to the patient. Follow up appointments , new prescriptions and medication administration times given Patient escorted via WC, and D/C home via private auto.  Cindra Eves, RN 03/16/2012 3:04 PM

## 2012-03-18 ENCOUNTER — Other Ambulatory Visit (HOSPITAL_COMMUNITY): Payer: Self-pay | Admitting: Internal Medicine

## 2012-03-18 ENCOUNTER — Telehealth: Payer: Self-pay | Admitting: Internal Medicine

## 2012-03-18 DIAGNOSIS — E041 Nontoxic single thyroid nodule: Secondary | ICD-10-CM

## 2012-03-18 NOTE — Progress Notes (Signed)
Utilization Review Completed.Bobby [name] T3/25/2013   

## 2012-03-19 NOTE — Telephone Encounter (Signed)
Patient was called and informed about his nuclear medicine Thyroid scan on 03/25/12 i have also arranged follow up with Dr. Lucianne Muss

## 2012-03-25 ENCOUNTER — Ambulatory Visit (HOSPITAL_COMMUNITY): Payer: BC Managed Care – PPO

## 2012-03-26 ENCOUNTER — Other Ambulatory Visit (HOSPITAL_COMMUNITY): Payer: BC Managed Care – PPO

## 2012-04-15 ENCOUNTER — Other Ambulatory Visit: Payer: Self-pay | Admitting: Endocrinology

## 2012-04-15 DIAGNOSIS — E0590 Thyrotoxicosis, unspecified without thyrotoxic crisis or storm: Secondary | ICD-10-CM

## 2012-04-18 ENCOUNTER — Encounter (HOSPITAL_COMMUNITY)
Admission: RE | Admit: 2012-04-18 | Discharge: 2012-04-18 | Disposition: A | Discharge status: Home / Self Care | Payer: BC Managed Care – PPO | Source: Ambulatory Visit | Attending: Endocrinology | Admitting: Endocrinology

## 2012-04-18 DIAGNOSIS — E0590 Thyrotoxicosis, unspecified without thyrotoxic crisis or storm: Secondary | ICD-10-CM | POA: Insufficient documentation

## 2012-04-19 ENCOUNTER — Encounter (HOSPITAL_COMMUNITY)
Admission: RE | Admit: 2012-04-19 | Discharge: 2012-04-19 | Disposition: A | Discharge status: Home / Self Care | Payer: BC Managed Care – PPO | Source: Ambulatory Visit | Attending: Endocrinology | Admitting: Endocrinology

## 2012-04-19 ENCOUNTER — Other Ambulatory Visit: Payer: Self-pay | Admitting: Endocrinology

## 2012-04-19 ENCOUNTER — Ambulatory Visit (HOSPITAL_COMMUNITY)
Admission: RE | Admit: 2012-04-19 | Discharge: 2012-04-19 | Disposition: A | Discharge status: Home / Self Care | Payer: BC Managed Care – PPO | Source: Ambulatory Visit | Attending: Endocrinology | Admitting: Endocrinology

## 2012-04-19 DIAGNOSIS — E0590 Thyrotoxicosis, unspecified without thyrotoxic crisis or storm: Secondary | ICD-10-CM | POA: Insufficient documentation

## 2012-04-19 DIAGNOSIS — E0500 Thyrotoxicosis with diffuse goiter without thyrotoxic crisis or storm: Secondary | ICD-10-CM | POA: Insufficient documentation

## 2012-04-19 MED ORDER — SODIUM IODIDE I 131 CAPSULE
25.0000 | Freq: Once | INTRAVENOUS | Status: AC | PRN
  Administered 2012-04-19: 27.4 via ORAL

## 2012-04-19 MED ORDER — SODIUM PERTECHNETATE TC 99M INJECTION
10.0000 | Freq: Once | INTRAVENOUS | Status: AC | PRN
  Administered 2012-04-19: 10 via INTRAVENOUS

## 2012-04-19 MED ORDER — SODIUM IODIDE I 131 CAPSULE
11.0000 | Freq: Once | INTRAVENOUS | Status: AC | PRN
  Administered 2012-04-19: 11 via ORAL

## 2013-08-21 ENCOUNTER — Other Ambulatory Visit: Payer: Self-pay | Admitting: *Deleted

## 2013-08-21 ENCOUNTER — Other Ambulatory Visit (INDEPENDENT_AMBULATORY_CARE_PROVIDER_SITE_OTHER): Payer: BC Managed Care – PPO

## 2013-08-21 DIAGNOSIS — E0590 Thyrotoxicosis, unspecified without thyrotoxic crisis or storm: Secondary | ICD-10-CM

## 2013-08-21 LAB — T4, FREE: Free T4: 1.05 ng/dL (ref 0.60–1.60)

## 2013-08-21 LAB — TSH: TSH: 0.89 u[IU]/mL (ref 0.35–5.50)

## 2013-08-27 ENCOUNTER — Encounter: Payer: Self-pay | Admitting: Endocrinology

## 2013-08-27 ENCOUNTER — Ambulatory Visit (INDEPENDENT_AMBULATORY_CARE_PROVIDER_SITE_OTHER): Payer: BC Managed Care – PPO | Admitting: Endocrinology

## 2013-08-27 VITALS — BP 122/86 | HR 71 | Temp 98.7°F | Resp 12 | Ht 73.0 in | Wt 231.6 lb

## 2013-08-27 DIAGNOSIS — E039 Hypothyroidism, unspecified: Secondary | ICD-10-CM | POA: Insufficient documentation

## 2013-08-27 NOTE — Progress Notes (Signed)
Patient ID: Bobby [name], male   DOB: 12-08-1971, 42 y.o.   MRN: [id number]  Reason for Appointment:  Hypothyroidism, followup visit    History of Present Illness:   The hypothyroidism was first diagnosed  after treatment of his hyperthyroidism with I-131 a couple of years ago He had required adjustment of the doses previously but subsequently has been stable. He came in earlier for a followup visit because of symptoms of shakiness, increased sweating, some palpitations with exertion, fatigue for the last month or so.  The treatments that the patient has taken include brand-name Synthroid.                    Compliance with the medical regimen has been as prescribed with taking the tablet in the morning before breakfast.  He also recently saw his primary care physician who felt that he may have had sinus infection causing the sweating and nonspecific symptoms and is on amoxicillin Also he has had more stress recently  Appointment on 08/21/2013  Component Date Value Range Status  . Free T4 08/21/2013 1.05  0.60 - 1.60 ng/dL Final  . TSH 28/41/3244 0.89  0.35 - 5.50 uIU/mL Final      Medication List       This list is accurate as of: 08/27/13  4:53 PM.  Always use your most recent med list.               ALPRAZolam 1 MG tablet  Commonly known as:  XANAX  1 mg.     amoxicillin 875 MG tablet  Commonly known as:  AMOXIL  875 mg.     cetirizine 10 MG tablet  Commonly known as:  ZYRTEC  Take 10 mg by mouth daily.     CRESTOR 10 MG tablet  Generic drug:  rosuvastatin  10 mg.     fluticasone 50 MCG/ACT nasal spray  Commonly known as:  FLONASE     lisinopril 10 MG tablet  Commonly known as:  PRINIVIL,ZESTRIL  Take 10 mg by mouth daily.     metoprolol succinate 50 MG 24 hr tablet  Commonly known as:  TOPROL-XL  50 mg 2 (two) times daily.     montelukast 10 MG tablet  Commonly known as:  SINGULAIR  Take 10 mg by mouth at bedtime.     NEXIUM 40 MG capsule  Generic  drug:  esomeprazole  40 mg.     pantoprazole 40 MG tablet  Commonly known as:  PROTONIX  Take 40 mg by mouth daily.     saccharomyces boulardii 250 MG capsule  Commonly known as:  FLORASTOR  Take 250 mg by mouth 2 (two) times daily.     SYNTHROID 150 MCG tablet  Generic drug:  levothyroxine  150 mcg.        Past Medical History  Diagnosis Date  . MI (myocardial infarction)   . Hypertension     No past surgical history on file.  No family history on file.  Social History:  reports that he has never smoked. He has never used smokeless tobacco. He reports that he does not drink alcohol or use illicit drugs.  Allergies: No Known Allergies   Examination:   BP 122/86  Pulse 71  Temp(Src) 98.7 F (37.1 C)  Resp 12  Ht 6\' 1"  (1.854 m)  Wt 231 lb 9.6 oz (105.053 kg)  BMI 30.56 kg/m2  SpO2 95%   GENERAL APPEARANCE: Alert And looks well.  FACE: No puffiness of face or eye signs        NECK: no thyromegaly.          NEUROLOGIC EXAM: DTRs 2+ bilaterally at biceps.    Assessments   Hypothyroidism, post ablative with adequate supplementation on brand-name Synthroid 150 mcg Nonspecific fatigue, anxiety and possible sinusitis    Treatment:   Continue same dosage before breakfast daily. Avoid taking any calcium or iron supplements with the thyroid supplement.  Followup in 6 months    Velda Wendt 08/27/2013, 4:53 PM

## 2013-09-23 ENCOUNTER — Other Ambulatory Visit: Payer: BC Managed Care – PPO

## 2013-09-25 ENCOUNTER — Ambulatory Visit: Payer: BC Managed Care – PPO | Admitting: Endocrinology

## 2013-12-24 IMAGING — CR DG CHEST 2V
2 series · 2 of 2 positions shown · non-contrast
Comparison: None.

CLINICAL DATA: Tremors, weakness, history hypertension

CHEST - 2 VIEW

[w chest pa]
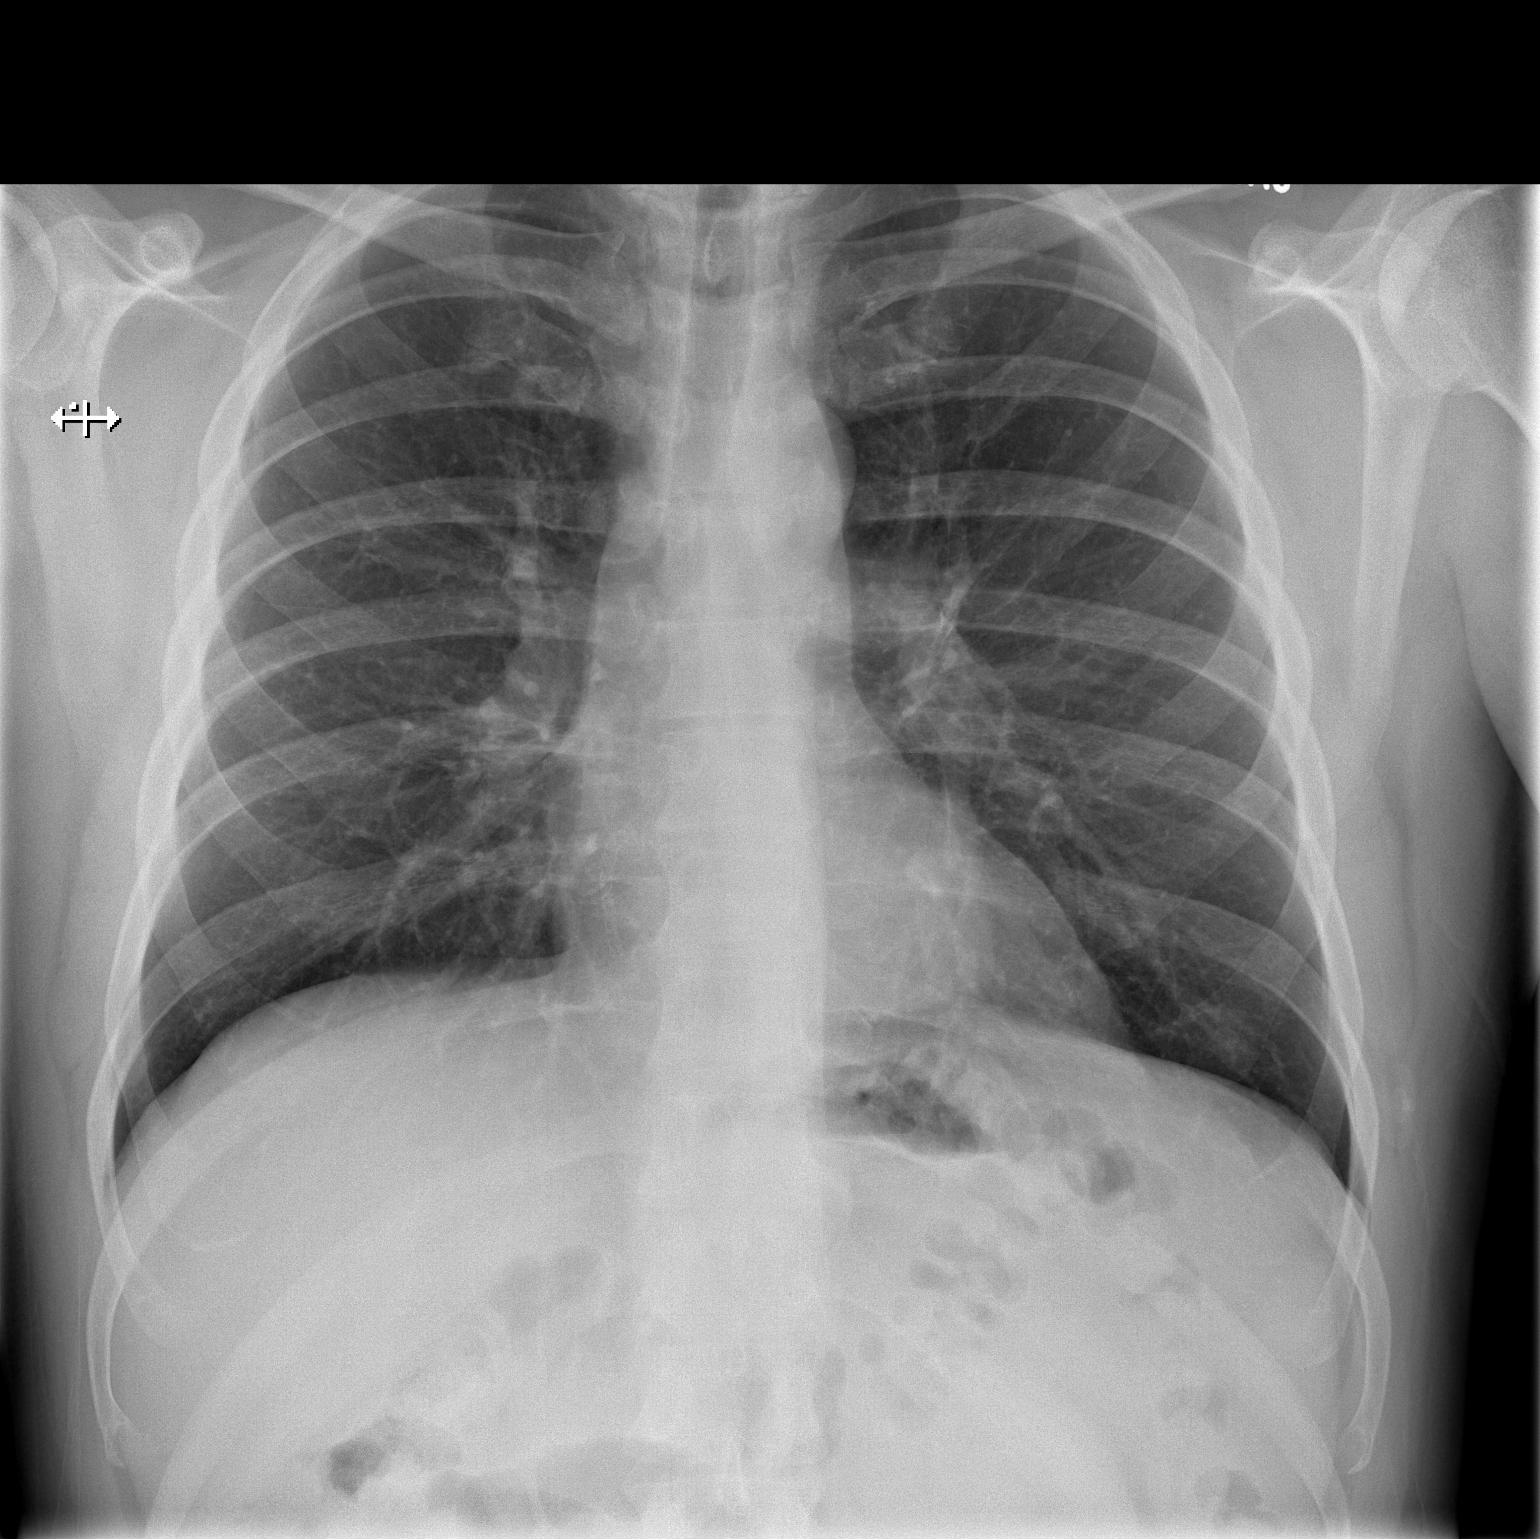

[w chest lat]
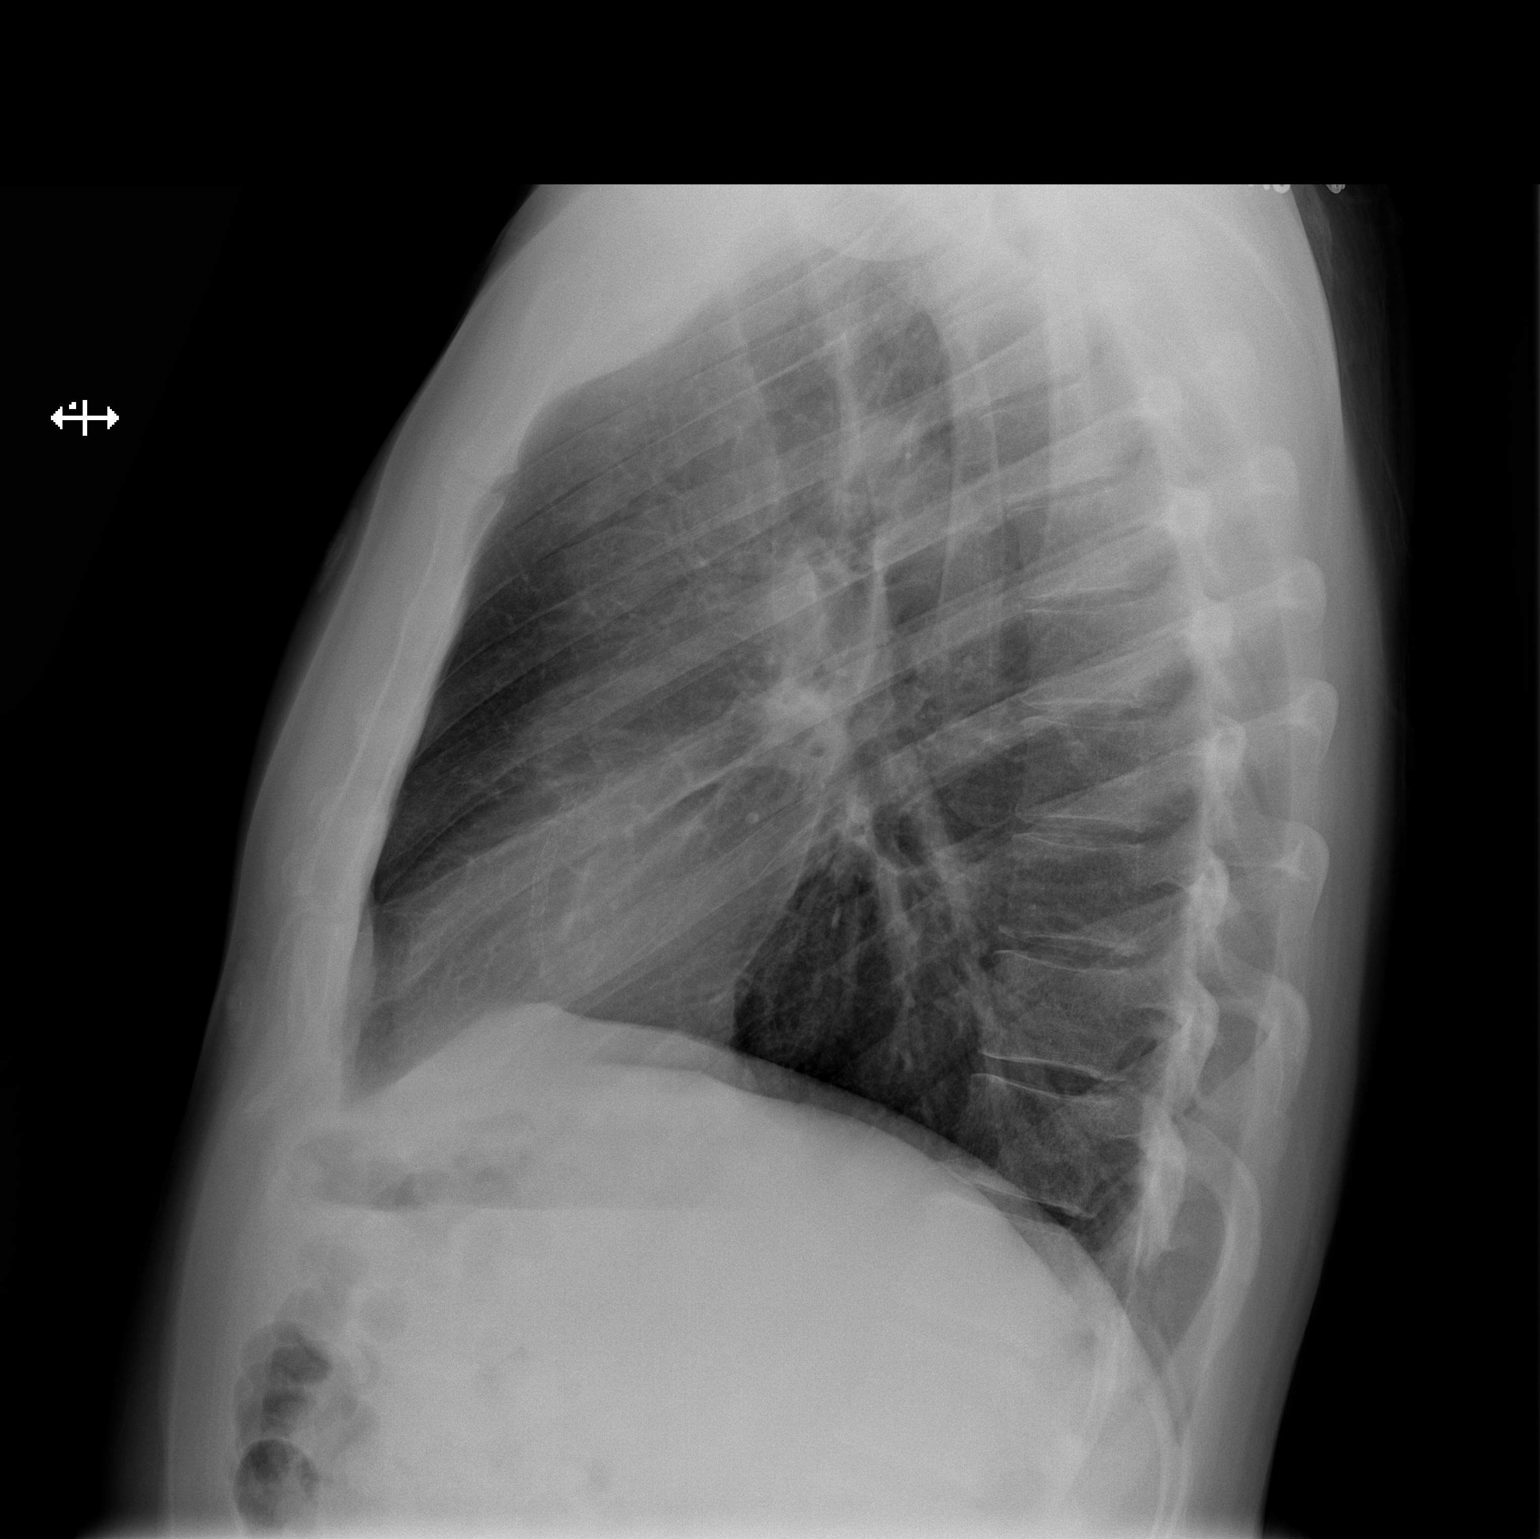

[2 of 2 positions shown; findings below may reference images not displayed]

FINDINGS: Normal heart size, mediastinal contours, and pulmonary vascularity.
Lungs clear.
Bones unremarkable.
No pneumothorax.
IMPRESSION: No acute abnormalities.

## 2013-12-25 IMAGING — US US SOFT TISSUE HEAD/NECK
1 series · 14 of 25 positions shown · non-contrast
Comparison: None.

CLINICAL DATA: Hyperthyroidism, evaluate for nodule

THYROID ULTRASOUND
TECHNIQUE: Ultrasound examination of the thyroid gland and adjacent
soft tissues was performed.

[Series 1: us soft tissue head/neck · 0.07mm/px · 14 of 42 slices shown]
[im 1/42]
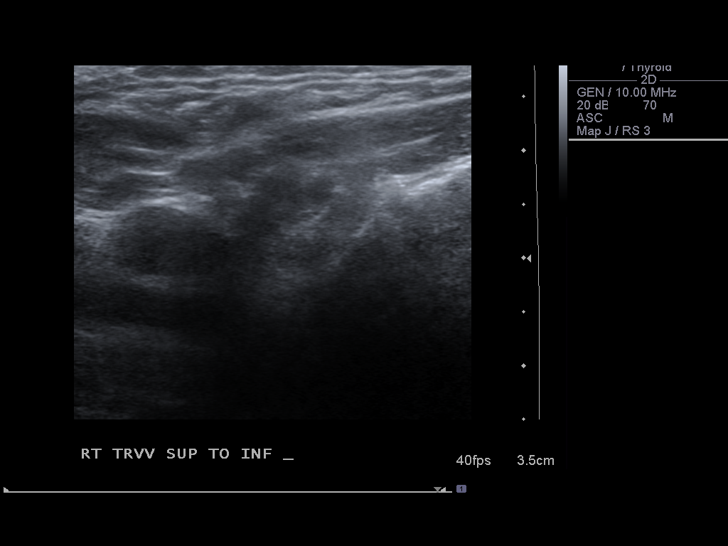
[im 4/42]
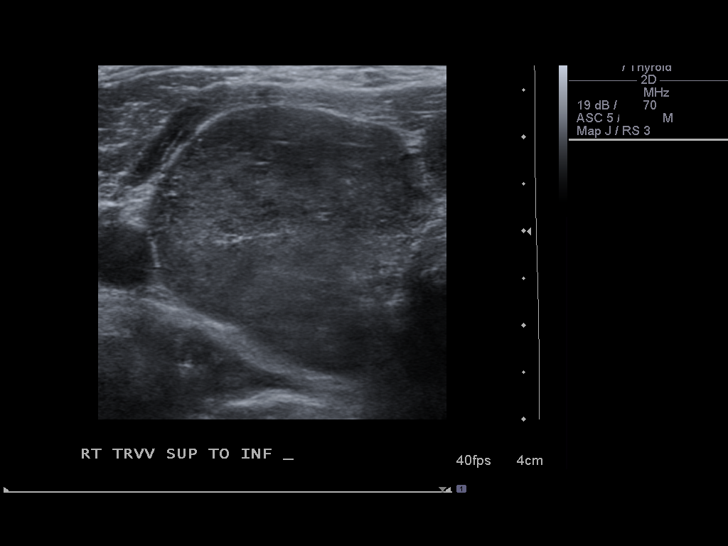
[im 7/42]
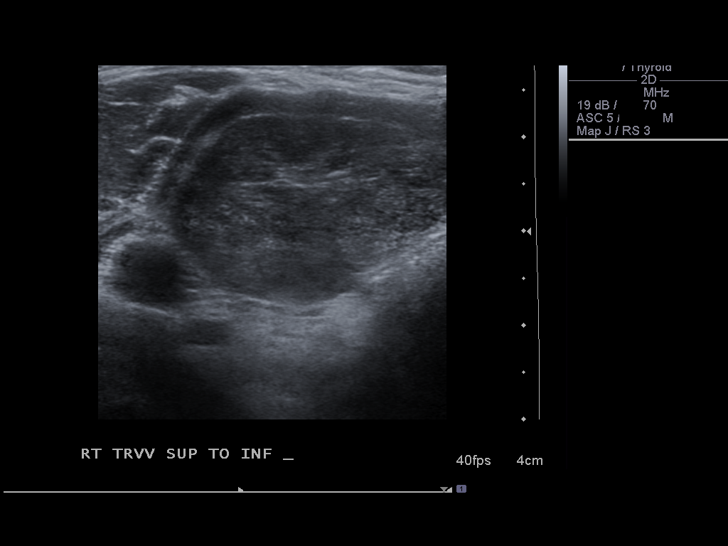
[im 11/42]
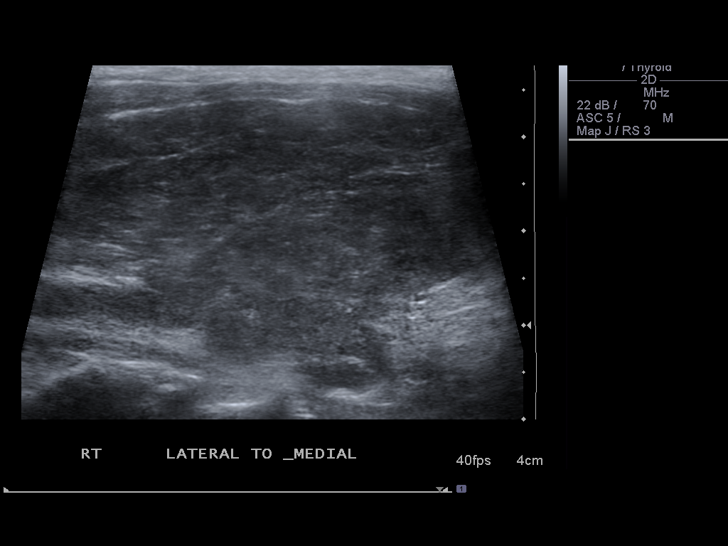
[im 14/42]
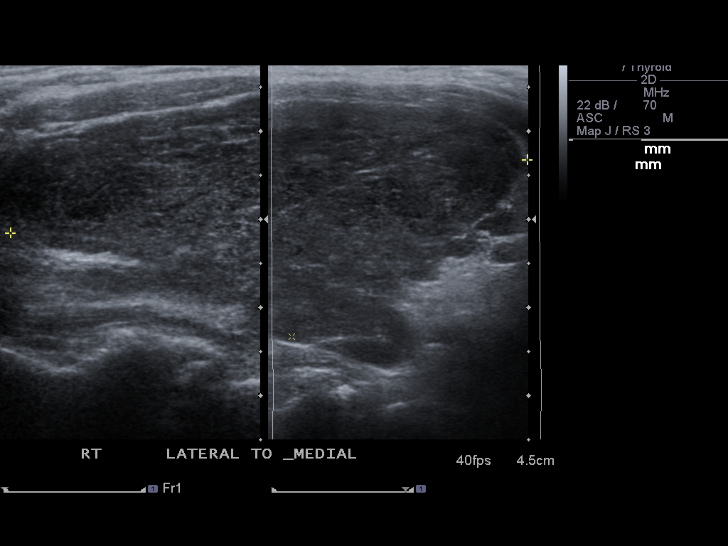
[im 16/42]
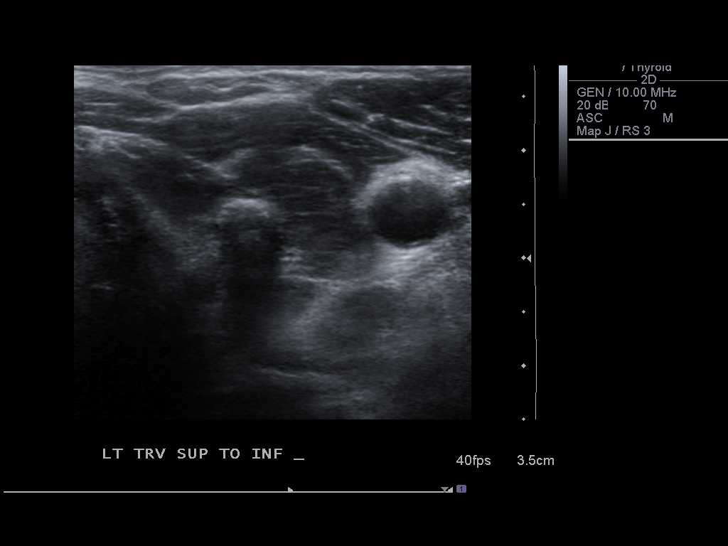
[im 19/42]
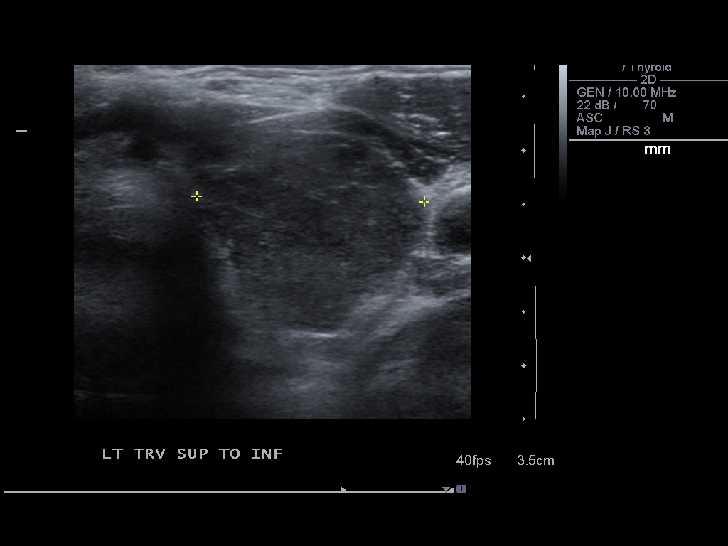
[im 23/42]
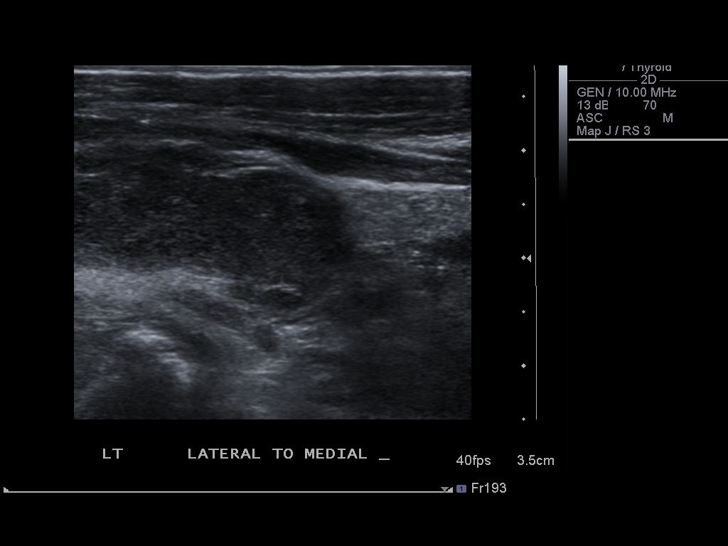
[im 26/42]
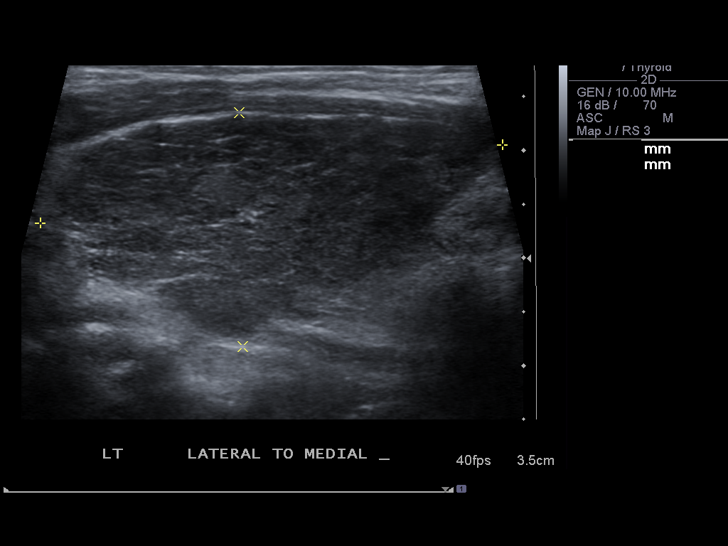
[im 28/42]
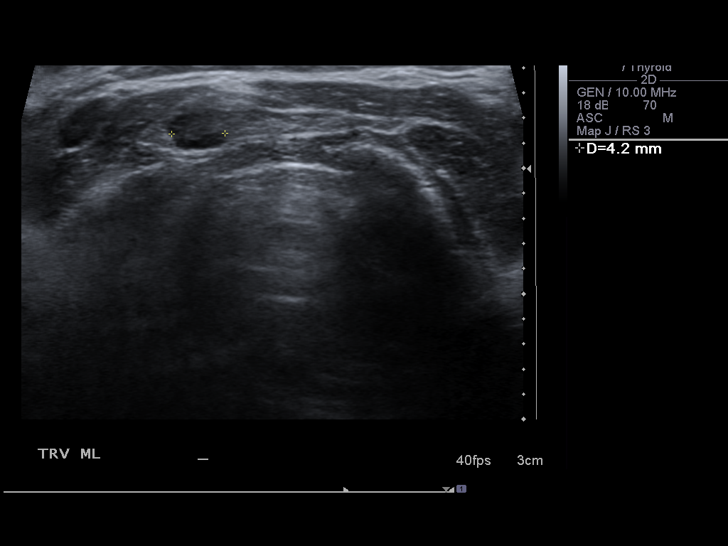
[im 31/42]
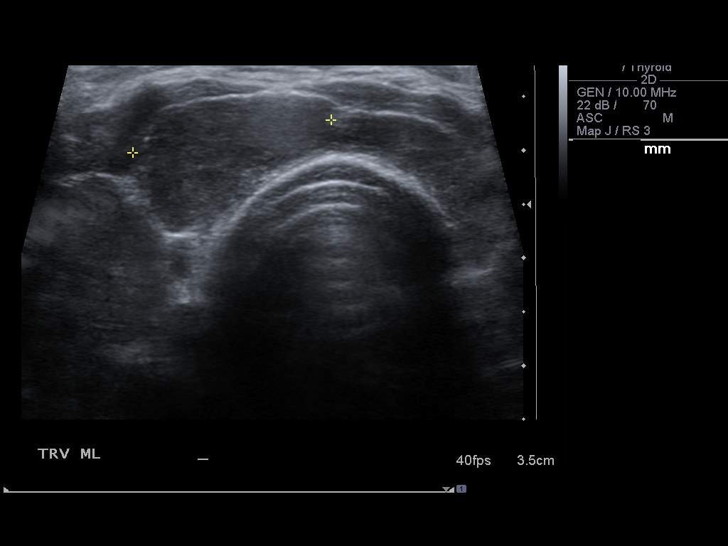
[im 35/42]
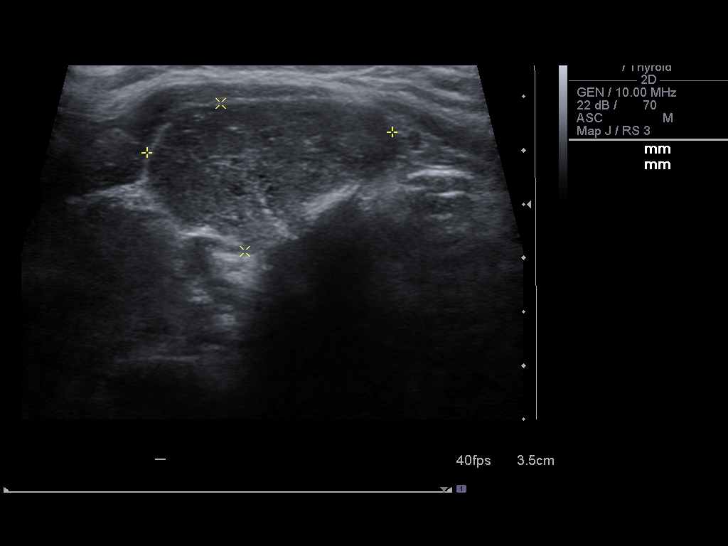
[im 38/42]
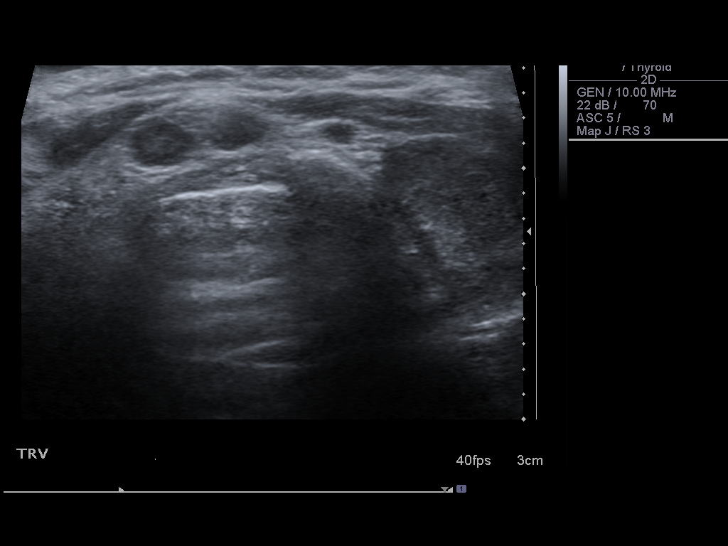
[im 42/42]
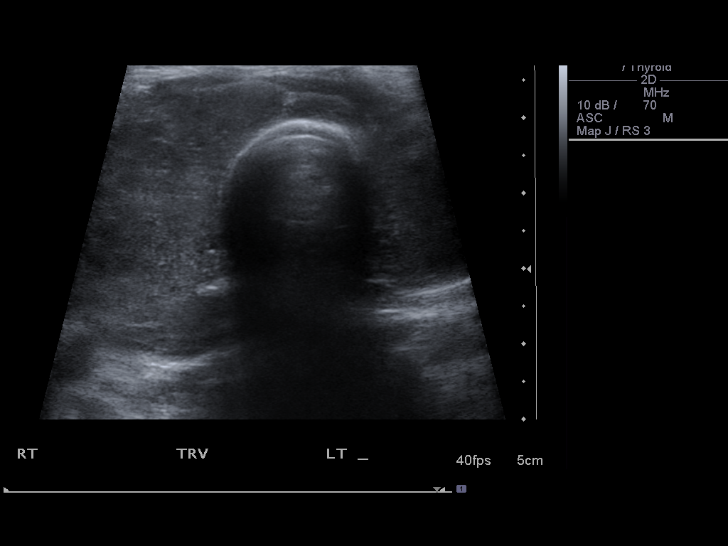

[14 of 25 positions shown; findings below may reference images not displayed]

FINDINGS: Right thyroid lobe:  Enlarged, measuring 5.9 x 2.9 x 2.9 cm.
Hypervascular.
Left thyroid lobe:  Measures 4.4 x 2.2 x 2.1 cm.  Hypervascular.
Isthmus:  Measures 6 mm in thickness.

Focal nodules:  2.3 x 1.4 x 1.9 cm solid nodule in the right
isthmus.  Two smaller subcentimeter nodules in the left isthmus.

Lymphadenopathy:  None visualized.
IMPRESSION: Enlarged, hypervascular thyroid gland.

2.3 cm solid nodule in the right isthmus.  Percutaneous sampling is
suggested.

This recommendation follows the consensus statement:  Management of
Thyroid Nodules Detected as US:  Society of Radiologists in
800.

## 2014-02-19 ENCOUNTER — Other Ambulatory Visit: Payer: BC Managed Care – PPO

## 2014-02-25 ENCOUNTER — Ambulatory Visit: Payer: BC Managed Care – PPO | Admitting: Endocrinology

## 2014-09-28 ENCOUNTER — Encounter: Payer: Self-pay | Admitting: Neurology

## 2014-09-28 ENCOUNTER — Encounter (INDEPENDENT_AMBULATORY_CARE_PROVIDER_SITE_OTHER): Payer: Self-pay

## 2014-09-28 ENCOUNTER — Ambulatory Visit (INDEPENDENT_AMBULATORY_CARE_PROVIDER_SITE_OTHER): Payer: BC Managed Care – PPO | Admitting: Neurology

## 2014-09-28 VITALS — BP 131/83 | HR 69 | Temp 98.2°F | Resp 14 | Ht 73.5 in | Wt 221.0 lb

## 2014-09-28 DIAGNOSIS — G478 Other sleep disorders: Secondary | ICD-10-CM

## 2014-09-28 DIAGNOSIS — G4719 Other hypersomnia: Secondary | ICD-10-CM

## 2014-09-28 DIAGNOSIS — I252 Old myocardial infarction: Secondary | ICD-10-CM

## 2014-09-28 DIAGNOSIS — G479 Sleep disorder, unspecified: Secondary | ICD-10-CM

## 2014-09-28 NOTE — Progress Notes (Signed)
Subjective:    Patient ID: Bobby [name] is a 43 y.o. male.  HPI    Star Age, MD, PhD [facility] [address], [address] [place], [place] [address]  Dear Ulice Dash,   I saw your patient, Bobby [name], upon your kind request in my neurologic clinic today for initial consultation of a sleep disturbance, in particular, concern for underlying obstructive sleep apnea. The patient is unaccompanied today. As you know, Bobby [name] is a 43 year old right-handed gentleman with an underlying medical history of hypothyroidism with Hx of Graves d/s, hyperlipidemia, allergic rhinitis, hypertension, reflux disease, obesity and CAD, s/p MI and 2 stents, who reports non-restorative sleep, and daytime somnolence. He reports difficulty with concentration and he has very restless sleep, tossing and turning, and sleep talking. He grinds his teeth. He has a FHx early cardiac. He has occasional nocturia. He has a bedtime of 9-10 PM, and while he goes to sleep quickly, he wakes multiple times at night. He has had night sweats. He has had hotflashes and feels shaky.  He drinks 2 sodas per day with caffeine. He drinks alcohol rarely. He quit smoking. He denies morning headaches.  He reports excessive daytime somnolence (EDS) and His Epworth Sleepiness Score (ESS) is 14/24 today. He has not fallen asleep while driving. The patient has not been taking a scheduled nap, but sometimes takes a nap, which may make him feel worse. He denies a sense of choking or strangling feeling. There is report of nighttime reflux, with rare nighttime cough experienced. The patient has not noted any RLS symptoms and is known to kick while asleep or before falling asleep. There is family history of RLS or OSA.  He is a restless sleeper and in the morning, the bed is quite disheveled.    He denies cataplexy, sleep paralysis, hypnagogic or hypnopompic hallucinations, or sleep attacks. He does report dream enactments, or  parasomnias, such as sleep talking or sleep walking. The patient has not had a sleep study or a home sleep test. He is not sure if he dreams.  His bedroom is usually dark and cool. There is a TV in the bedroom and usually it is not on at night.   His Past Medical History Is Significant For: Past Medical History  Diagnosis Date  . MI (myocardial infarction)   . Hypertension     His Past Surgical History Is Significant For: No past surgical history on file.  His Family History Is Significant For: Family History  Problem Relation Age of Onset  . Heart disease Father   . Heart disease Paternal Grandmother     His Social History Is Significant For: History   Social History  . Marital Status: Single    Spouse Name: N/A    Number of Children: N/A  . Years of Education: N/A   Social History Main Topics  . Smoking status: Former Research scientist (life sciences)  . Smokeless tobacco: Never Used     Comment: quit 2012  . Alcohol Use: Yes     Comment: occ  . Drug Use: No  . Sexual Activity: Yes   Other Topics Concern  . Not on file   Social History Narrative   Lives with fiance, working FT , disesel equip, right handed, 2 kids, college edu    His Allergies Are:  No Known Allergies:   His Current Medications Are:  Outpatient Encounter Prescriptions as of 09/28/2014  Medication Sig  . ALPRAZolam (XANAX) 1 MG tablet 1 mg.  Marland Kitchen  ANDROGEL PUMP 20.25 MG/ACT (1.62%) GEL   . aspirin 81 MG tablet Take 81 mg by mouth daily.  . cholecalciferol (VITAMIN D) 1000 UNITS tablet Take 2,000 Units by mouth daily.  . clopidogrel (PLAVIX) 75 MG tablet 75 mg.  . CRESTOR 10 MG tablet 20 mg.   . fluticasone (FLONASE) 50 MCG/ACT nasal spray   . lisinopril (PRINIVIL,ZESTRIL) 10 MG tablet Take 10 mg by mouth daily.  . metoprolol succinate (TOPROL-XL) 50 MG 24 hr tablet 50 mg daily.   . montelukast (SINGULAIR) 10 MG tablet Take 10 mg by mouth at bedtime.  . Multiple Vitamins-Minerals (CENTRUM SILVER PO) Take by mouth daily.   Marland Kitchen NEXIUM 40 MG capsule 40 mg.  . NITROSTAT 0.4 MG SL tablet   . PARoxetine (PAXIL) 20 MG tablet   . SYNTHROID 150 MCG tablet 137 mcg.   . [DISCONTINUED] amoxicillin (AMOXIL) 875 MG tablet 875 mg.  . [DISCONTINUED] cetirizine (ZYRTEC) 10 MG tablet Take 10 mg by mouth daily.  . [DISCONTINUED] saccharomyces boulardii (FLORASTOR) 250 MG capsule Take 250 mg by mouth 2 (two) times daily.  :  Review of Systems:  Out of a complete 14 point review of systems, all are reviewed and negative with the exception of these symptoms as listed below:   Review of Systems  Constitutional: Positive for fatigue.  HENT:       Tinnitus  Endocrine:       Feeling hot  Neurological:       Memory loss, weakness, slurred speech, sleepiness, tremor  Psychiatric/Behavioral:       Depression, anxiety, decresased energy, change in appetite, racing thoughts    Objective:  Neurologic Exam  Physical Exam Physical Examination:   Filed Vitals:   09/28/14 0935  BP: 131/83  Pulse: 69  Temp: 98.2 F (36.8 C)  Resp: 14    General Examination: The patient is a very pleasant 43 y.o. male in no acute distress. He appears well-developed and well-nourished and well groomed.   HEENT: Normocephalic, atraumatic, pupils are equal, round and reactive to light and accommodation. Funduscopic exam is normal with sharp disc margins noted. Extraocular tracking is good without limitation to gaze excursion or nystagmus noted. Normal smooth pursuit is noted. Hearing is grossly intact. Tympanic membranes are clear bilaterally. Face is symmetric with normal facial animation and normal facial sensation. Speech is clear with no dysarthria noted. There is no hypophonia. There is no lip, neck/head, jaw or voice tremor. Neck is supple with full range of passive and active motion. There are no carotid bruits on auscultation. Oropharynx exam reveals: mild mouth dryness, adequate dental hygiene and mild airway crowding, due to redundant soft  palate and wider uvula. Mallampati is class I. Tongue protrudes centrally and palate elevates symmetrically. Tonsils are absent. Neck size is 16 5/8 inches. He has a Mild overbite. Nasal inspection reveals no significant nasal mucosal bogginess or redness and no septal deviation.   Chest: Clear to auscultation without wheezing, rhonchi or crackles noted.  Heart: S1+S2+0, regular and normal without murmurs, rubs or gallops noted.   Abdomen: Soft, non-tender and non-distended with normal bowel sounds appreciated on auscultation.  Extremities: There is no pitting edema in the distal lower extremities bilaterally. Pedal pulses are intact.  Skin: Warm and dry without trophic changes noted. There are no varicose veins.  Musculoskeletal: exam reveals no obvious joint deformities, tenderness or joint swelling or erythema.   Neurologically:  Mental status: The patient is awake, alert and oriented in all 4 spheres.  His immediate and remote memory, attention, language skills and fund of knowledge are appropriate. There is no evidence of aphasia, agnosia, apraxia or anomia. Speech is clear with normal prosody and enunciation. Thought process is linear. Mood is normal and affect is normal.  Cranial nerves II - XII are as described above under HEENT exam. In addition: shoulder shrug is normal with equal shoulder height noted. Motor exam: Normal bulk, strength and tone is noted. There is no drift, tremor or rebound. Romberg is negative. Reflexes are 2+ throughout. Babinski: Toes are flexor bilaterally. Fine motor skills and coordination: intact with normal finger taps, normal hand movements, normal rapid alternating patting, normal foot taps and normal foot agility.  Cerebellar testing: No dysmetria or intention tremor on finger to nose testing. Heel to shin is unremarkable bilaterally. There is no truncal or gait ataxia.  Sensory exam: intact to light touch, pinprick, vibration, temperature sense in the upper  and lower extremities.  Gait, station and balance: He stands easily. No veering to one side is noted. No leaning to one side is noted. Posture is age-appropriate and stance is narrow based. Gait shows normal stride length and normal pace. No problems turning are noted. He turns en bloc. Tandem walk is unremarkable. Intact toe and heel stance is noted.               Assessment and Plan:   In summary, Bobby [name] is a very pleasant 43 y.o.-year old male with an underlying medical history of hypothyroidism with Hx of Graves d/s, hyperlipidemia, allergic rhinitis, hypertension, reflux disease, obesity and CAD, s/p MI and 2 stents, who reports non-restorative sleep, and daytime somnolence. His history and physical exam are indeed concerning for obstructive sleep apnea (OSA). I had a long chat with the patient about my findings and the diagnosis of OSA, its prognosis and treatment options. We talked about medical treatments, surgical interventions and non-pharmacological approaches. I explained in particular the risks and ramifications of untreated moderate to severe OSA, especially with respect to developing cardiovascular disease down the Road, including congestive heart failure, difficult to treat hypertension, cardiac arrhythmias, or stroke. Even type 2 diabetes has, in part, been linked to untreated OSA. Symptoms of untreated OSA include daytime sleepiness, memory problems, mood irritability and mood disorder such as depression and anxiety, lack of energy, as well as recurrent headaches, especially morning headaches. We talked about trying to maintain a healthy lifestyle in general, as well as the importance of weight control. I encouraged the patient to eat healthy, exercise daily and keep well hydrated, to keep a scheduled bedtime and wake time routine, to not skip any meals and eat healthy snacks in between meals. I advised the patient not to drive when feeling sleepy. I recommended the following at this  time: sleep study with potential positive airway pressure titration. (We will score hypopneas at 3% and split the sleep study into diagnostic and treatment portion, if the estimated. 2 hour AHI is >15/h).   I explained the sleep test procedure to the patient and also outlined possible surgical and non-surgical treatment options of OSA, including the use of a custom-made dental device (which would require a referral to a specialist dentist or oral surgeon), upper airway surgical options, such as pillar implants, radiofrequency surgery, tongue base surgery, and UPPP (which would involve a referral to an ENT surgeon). Rarely, jaw surgery such as mandibular advancement may be considered.  I also explained the CPAP treatment option to the patient, who indicated that  he would be willing to try CPAP if the need arises. I explained the importance of being compliant with PAP treatment, not only for insurance purposes but primarily to improve His symptoms, and for the patient's long term health benefit, including to reduce His cardiovascular risks. I answered all his questions today and the patient was in agreement. I would like to see him back after the sleep study is completed and encouraged him to call with any interim questions, concerns, problems or updates.   Thank you very much for allowing me to participate in the care of this nice patient. If I can be of any further assistance to you please do not hesitate to call me at [phone].  Sincerely,   Star Age, MD, PhD

## 2014-09-28 NOTE — Patient Instructions (Signed)

## 2014-11-03 ENCOUNTER — Ambulatory Visit (INDEPENDENT_AMBULATORY_CARE_PROVIDER_SITE_OTHER): Payer: BC Managed Care – PPO | Admitting: Neurology

## 2014-11-03 DIAGNOSIS — G4730 Sleep apnea, unspecified: Secondary | ICD-10-CM

## 2014-11-03 DIAGNOSIS — G479 Sleep disorder, unspecified: Secondary | ICD-10-CM

## 2014-11-03 DIAGNOSIS — G4733 Obstructive sleep apnea (adult) (pediatric): Secondary | ICD-10-CM

## 2014-11-03 DIAGNOSIS — G4761 Periodic limb movement disorder: Secondary | ICD-10-CM

## 2014-11-03 DIAGNOSIS — G4710 Hypersomnia, unspecified: Secondary | ICD-10-CM

## 2014-11-03 DIAGNOSIS — G4719 Other hypersomnia: Secondary | ICD-10-CM

## 2014-11-03 NOTE — Sleep Study (Signed)
Please see the scanned sleep study interpretation located in the Procedure tab within the Chart Review section. 

## 2014-11-13 ENCOUNTER — Telehealth: Payer: Self-pay | Admitting: Neurology

## 2014-11-13 DIAGNOSIS — G4733 Obstructive sleep apnea (adult) (pediatric): Secondary | ICD-10-CM

## 2014-11-13 NOTE — Telephone Encounter (Signed)
Please call and notify the patient that the recent sleep study did confirm the diagnosis of obstructive sleep apnea and that I recommend treatment for this in the form of CPAP. This will require a repeat sleep study for proper titration and mask fitting. Please explain to patient and arrange for a CPAP titration study. I have placed an order in the chart. Thanks, Fabrice Dyal, MD, PhD [facility] ([facility]  

## 2014-11-18 ENCOUNTER — Encounter: Payer: Self-pay | Admitting: Neurology

## 2014-11-18 NOTE — Telephone Encounter (Signed)
Pt returning phone call to obtain sleep study results.  He was advised of the finding of mild obstructive sleep apnea.  He is aware of Dr. Guadelupe Sabin recommendation to return for a CPAP titration to treat his osa and other sleep related complaints.  The patient will have new benefits at the beginning of the year and will contact our office to schedule his sleep study when he receives his new insurance plan information.  A copy of his sleep study has been sent to Dr. Kela Millin and the patient has requested to receive his report via mail.

## 2016-02-11 ENCOUNTER — Other Ambulatory Visit: Payer: Self-pay | Admitting: *Deleted

## 2016-02-11 ENCOUNTER — Other Ambulatory Visit (INDEPENDENT_AMBULATORY_CARE_PROVIDER_SITE_OTHER): Payer: Self-pay

## 2016-02-11 DIAGNOSIS — E039 Hypothyroidism, unspecified: Secondary | ICD-10-CM

## 2016-02-11 LAB — TSH: TSH: 1.16 u[IU]/mL (ref 0.35–4.50)

## 2016-02-11 LAB — T4, FREE: Free T4: 0.87 ng/dL (ref 0.60–1.60)

## 2016-02-17 ENCOUNTER — Encounter: Payer: Self-pay | Admitting: Endocrinology

## 2016-02-17 ENCOUNTER — Ambulatory Visit (INDEPENDENT_AMBULATORY_CARE_PROVIDER_SITE_OTHER): Payer: 59 | Admitting: Endocrinology

## 2016-02-17 VITALS — BP 120/78 | HR 61 | Wt 215.0 lb

## 2016-02-17 DIAGNOSIS — E890 Postprocedural hypothyroidism: Secondary | ICD-10-CM | POA: Diagnosis not present

## 2016-02-17 NOTE — Progress Notes (Signed)
Patient ID: Bobby [name], male   DOB: 1971-08-27, 45 y.o.   MRN: [id number]   Reason for Appointment:  Hypothyroidism, followup visit    History of Present Illness:   The hypothyroidism was first diagnosed  after treatment of his hyperthyroidism with I-131 in 2012 He had required adjustment of the doses periodically  He has been followed by his PCP as he did not want to pay a higher specialist co-pay  He came in for follow-up today because of periodic symptoms of anxiety, sweating episodes and fatigue.  However symptoms are somewhat similar to what he had previously when he had been euthyroid Over the last year or so he has had a reduction in his Synthroid dose and had been on 125 g but more recently since about 5/16 has been on 137 g, his TSH at that time was 0.29 He has lost weight since his last visit He continues to take Synthroid brand name               Compliance with the medical regimen has been as prescribed with taking the tablet in the morning before breakfast.   Wt Readings from Last 3 Encounters:  02/17/16 215 lb (97.523 kg)  09/28/14 221 lb (100.245 kg)  08/27/13 231 lb 9.6 oz (105.053 kg)      Lab Results  Component Value Date   TSH 1.16 02/11/2016   TSH 0.89 08/21/2013   TSH <0.008* 03/15/2012   FREET4 0.87 02/11/2016   FREET4 1.05 08/21/2013   FREET4 5.34* 03/15/2012        Medication List       This list is accurate as of: 02/17/16 11:59 PM.  Always use your most recent med list.               ALPRAZolam 1 MG tablet  Commonly known as:  XANAX  1 mg.     ANDROGEL PUMP 20.25 MG/ACT (1.62%) Gel  Generic drug:  Testosterone     aspirin 81 MG tablet  Take 81 mg by mouth daily.     atenolol 25 MG tablet  Commonly known as:  TENORMIN  Take by mouth daily.     CENTRUM SILVER PO  Take by mouth daily.     cholecalciferol 1000 units tablet  Commonly known as:  VITAMIN D  Take 2,000 Units by mouth daily.     clopidogrel 75 MG  tablet  Commonly known as:  PLAVIX  75 mg.     CRESTOR 10 MG tablet  Generic drug:  rosuvastatin  20 mg.     fluticasone 50 MCG/ACT nasal spray  Commonly known as:  FLONASE     levothyroxine 137 MCG tablet  Commonly known as:  SYNTHROID, LEVOTHROID  Take 137 mcg by mouth daily before breakfast.     lisinopril 10 MG tablet  Commonly known as:  PRINIVIL,ZESTRIL  Take 10 mg by mouth daily.     montelukast 10 MG tablet  Commonly known as:  SINGULAIR  Take 10 mg by mouth at bedtime.     NEXIUM 40 MG capsule  Generic drug:  esomeprazole  40 mg.     NITROSTAT 0.4 MG SL tablet  Generic drug:  nitroGLYCERIN     PARoxetine 20 MG tablet  Commonly known as:  PAXIL        Past Medical History  Diagnosis Date  . MI (myocardial infarction) ([place])   . Hypertension     No past surgical history on file.  Family History  Problem Relation Age of Onset  . Heart disease Father   . Heart disease Paternal Grandmother     Social History:  reports that he has quit smoking. He has never used smokeless tobacco. He reports that he drinks alcohol. He reports that he does not use illicit drugs.  Allergies: No Known Allergies   Examination:     BP 120/78 mmHg  Pulse 61  Wt 215 lb (97.523 kg)  SpO2 96%  He  looks well, somewhat anxious.    NECK: no thyromegaly.          NEUROLOGIC EXAM: DTRs 2+ bilaterally at biceps, no tremor.  Skin is not unusually warm or moist    Assessments   Hypothyroidism, post ablative with adequate supplementation on brand-name Synthroid 137 mcg   Continued problems with anxiety and possibly bipolar disorder   Treatment:   Continue same dosage before breakfast daily.  He needs to discuss his current symptoms with his PCP and explained to him that with his TSH being quite normal he does not need to change Synthroid dose  However if he continues to have persistent anxiety may consider reducing the dose back to 125 and keeping the TSH at upper  normal level. He prefers to follow up with his PCP  [facility] 02/18/2016, 8:06 AM

## 2017-07-13 ENCOUNTER — Inpatient Hospital Stay (HOSPITAL_COMMUNITY): Payer: 59

## 2017-07-13 ENCOUNTER — Encounter (HOSPITAL_COMMUNITY): Payer: Self-pay | Admitting: Emergency Medicine

## 2017-07-13 ENCOUNTER — Emergency Department (HOSPITAL_COMMUNITY): Payer: 59

## 2017-07-13 ENCOUNTER — Inpatient Hospital Stay (HOSPITAL_COMMUNITY)
Admission: EM | Admit: 2017-07-13 | Discharge: 2017-07-17 | DRG: 215 | Disposition: A | Discharge status: Home / Self Care | Payer: 59 | Attending: Cardiology | Admitting: Cardiology

## 2017-07-13 ENCOUNTER — Other Ambulatory Visit: Payer: Self-pay

## 2017-07-13 ENCOUNTER — Other Ambulatory Visit (HOSPITAL_COMMUNITY): Payer: 59

## 2017-07-13 ENCOUNTER — Encounter (HOSPITAL_COMMUNITY)
Admission: EM | Disposition: A | Discharge status: Home / Self Care | Payer: Self-pay | Source: Home / Self Care | Attending: Cardiology

## 2017-07-13 DIAGNOSIS — Z7902 Long term (current) use of antithrombotics/antiplatelets: Secondary | ICD-10-CM | POA: Diagnosis not present

## 2017-07-13 DIAGNOSIS — I2510 Atherosclerotic heart disease of native coronary artery without angina pectoris: Secondary | ICD-10-CM | POA: Diagnosis present

## 2017-07-13 DIAGNOSIS — I509 Heart failure, unspecified: Secondary | ICD-10-CM | POA: Diagnosis present

## 2017-07-13 DIAGNOSIS — Z79890 Hormone replacement therapy (postmenopausal): Secondary | ICD-10-CM | POA: Diagnosis not present

## 2017-07-13 DIAGNOSIS — Z955 Presence of coronary angioplasty implant and graft: Secondary | ICD-10-CM | POA: Diagnosis not present

## 2017-07-13 DIAGNOSIS — Z79899 Other long term (current) drug therapy: Secondary | ICD-10-CM | POA: Diagnosis not present

## 2017-07-13 DIAGNOSIS — D696 Thrombocytopenia, unspecified: Secondary | ICD-10-CM | POA: Diagnosis present

## 2017-07-13 DIAGNOSIS — R570 Cardiogenic shock: Secondary | ICD-10-CM

## 2017-07-13 DIAGNOSIS — R739 Hyperglycemia, unspecified: Secondary | ICD-10-CM | POA: Diagnosis present

## 2017-07-13 DIAGNOSIS — Z8249 Family history of ischemic heart disease and other diseases of the circulatory system: Secondary | ICD-10-CM

## 2017-07-13 DIAGNOSIS — F17210 Nicotine dependence, cigarettes, uncomplicated: Secondary | ICD-10-CM | POA: Diagnosis present

## 2017-07-13 DIAGNOSIS — I959 Hypotension, unspecified: Secondary | ICD-10-CM | POA: Diagnosis present

## 2017-07-13 DIAGNOSIS — E0500 Thyrotoxicosis with diffuse goiter without thyrotoxic crisis or storm: Secondary | ICD-10-CM | POA: Diagnosis present

## 2017-07-13 DIAGNOSIS — I110 Hypertensive heart disease with heart failure: Secondary | ICD-10-CM | POA: Diagnosis present

## 2017-07-13 DIAGNOSIS — I2102 ST elevation (STEMI) myocardial infarction involving left anterior descending coronary artery: Secondary | ICD-10-CM

## 2017-07-13 DIAGNOSIS — E785 Hyperlipidemia, unspecified: Secondary | ICD-10-CM | POA: Diagnosis present

## 2017-07-13 DIAGNOSIS — Z7982 Long term (current) use of aspirin: Secondary | ICD-10-CM

## 2017-07-13 DIAGNOSIS — I2109 ST elevation (STEMI) myocardial infarction involving other coronary artery of anterior wall: Principal | ICD-10-CM | POA: Diagnosis present

## 2017-07-13 DIAGNOSIS — I252 Old myocardial infarction: Secondary | ICD-10-CM

## 2017-07-13 DIAGNOSIS — R079 Chest pain, unspecified: Secondary | ICD-10-CM

## 2017-07-13 DIAGNOSIS — Z9861 Coronary angioplasty status: Secondary | ICD-10-CM

## 2017-07-13 HISTORY — DX: Cardiogenic shock: R57.0

## 2017-07-13 HISTORY — PX: VENTRICULAR ASSIST DEVICE INSERTION: CATH118273

## 2017-07-13 HISTORY — DX: Thyrotoxicosis with diffuse goiter without thyrotoxic crisis or storm: E05.00

## 2017-07-13 HISTORY — DX: Disorder of thyroid, unspecified: E07.9

## 2017-07-13 HISTORY — DX: ST elevation (STEMI) myocardial infarction involving other coronary artery of anterior wall: I21.09

## 2017-07-13 HISTORY — PX: LEFT HEART CATH AND CORONARY ANGIOGRAPHY: CATH118249

## 2017-07-13 HISTORY — PX: CORONARY/GRAFT ACUTE MI REVASCULARIZATION: CATH118305

## 2017-07-13 LAB — HIV ANTIBODY (ROUTINE TESTING W REFLEX): HIV Screen 4th Generation wRfx: NONREACTIVE

## 2017-07-13 LAB — CBC
HEMATOCRIT: 44.2 % (ref 39.0–52.0)
HEMOGLOBIN: 15.5 g/dL (ref 13.0–17.0)
MCH: 31.6 pg (ref 26.0–34.0)
MCHC: 35.1 g/dL (ref 30.0–36.0)
MCV: 90 fL (ref 78.0–100.0)
PLATELETS: 193 10*3/uL (ref 150–400)
RBC: 4.91 MIL/uL (ref 4.22–5.81)
RDW: 13.1 % (ref 11.5–15.5)
WBC: 14.1 10*3/uL — AB (ref 4.0–10.5)

## 2017-07-13 LAB — BASIC METABOLIC PANEL
ANION GAP: 8 (ref 5–15)
BUN: 15 mg/dL (ref 6–20)
CHLORIDE: 106 mmol/L (ref 101–111)
CO2: 23 mmol/L (ref 22–32)
Calcium: 8.8 mg/dL — ABNORMAL LOW (ref 8.9–10.3)
Creatinine, Ser: 1.42 mg/dL — ABNORMAL HIGH (ref 0.61–1.24)
GFR calc non Af Amer: 58 mL/min — ABNORMAL LOW (ref >60)
Glucose, Bld: 158 mg/dL — ABNORMAL HIGH (ref 65–99)
POTASSIUM: 3.8 mmol/L (ref 3.5–5.1)
SODIUM: 137 mmol/L (ref 135–145)

## 2017-07-13 LAB — TROPONIN I
Troponin I: >65 ng/mL (ref <0.03)
Troponin I: >65 ng/mL (ref <0.03)

## 2017-07-13 LAB — POCT ACTIVATED CLOTTING TIME
ACTIVATED CLOTTING TIME: 213 s
Activated Clotting Time: 252 seconds

## 2017-07-13 LAB — HEPARIN LEVEL (UNFRACTIONATED): HEPARIN UNFRACTIONATED: 0.68 [IU]/mL (ref 0.30–0.70)

## 2017-07-13 LAB — I-STAT TROPONIN, ED: Troponin i, poc: 0.01 ng/mL (ref 0.00–0.08)

## 2017-07-13 LAB — COOXEMETRY PANEL
Carboxyhemoglobin: 1.8 % — ABNORMAL HIGH (ref 0.5–1.5)
Methemoglobin: 0.7 % (ref 0.0–1.5)
O2 SAT: 65.8 %
Total hemoglobin: 16.5 g/dL — ABNORMAL HIGH (ref 12.0–16.0)

## 2017-07-13 LAB — MRSA PCR SCREENING: MRSA by PCR: NEGATIVE

## 2017-07-13 LAB — TSH: TSH: 1.36 u[IU]/mL (ref 0.350–4.500)

## 2017-07-13 SURGERY — LEFT HEART CATH AND CORONARY ANGIOGRAPHY
Anesthesia: LOCAL

## 2017-07-13 SURGERY — Surgical Case
Anesthesia: *Unknown

## 2017-07-13 MED ORDER — NOREPINEPHRINE BITARTRATE 1 MG/ML IV SOLN
INTRAVENOUS | Status: AC
  Filled 2017-07-13: qty 4

## 2017-07-13 MED ORDER — SODIUM CHLORIDE 0.9% FLUSH
10.0000 mL | INTRAVENOUS | Status: DC | PRN
  Administered 2017-07-13: 10 mL
  Filled 2017-07-13: qty 40

## 2017-07-13 MED ORDER — ONDANSETRON HCL 4 MG/2ML IJ SOLN
INTRAMUSCULAR | Status: AC
  Filled 2017-07-13: qty 2

## 2017-07-13 MED ORDER — FENTANYL CITRATE (PF) 100 MCG/2ML IJ SOLN
INTRAMUSCULAR | Status: DC | PRN
  Administered 2017-07-13: 25 ug via INTRAVENOUS
  Administered 2017-07-13: 50 ug via INTRAVENOUS

## 2017-07-13 MED ORDER — ASPIRIN EC 81 MG PO TBEC
81.0000 mg | DELAYED_RELEASE_TABLET | Freq: Every day | ORAL | Status: DC
  Administered 2017-07-14 – 2017-07-17 (×4): 81 mg via ORAL
  Filled 2017-07-13 (×4): qty 1

## 2017-07-13 MED ORDER — DIAZEPAM 5 MG PO TABS
5.0000 mg | ORAL_TABLET | Freq: Four times a day (QID) | ORAL | Status: DC | PRN
  Administered 2017-07-13 – 2017-07-15 (×4): 5 mg via ORAL
  Filled 2017-07-13 (×4): qty 1

## 2017-07-13 MED ORDER — TIROFIBAN (AGGRASTAT) 50MCG/ML FOR INTRACORONARY USE
INTRACORONARY | Status: DC | PRN
  Administered 2017-07-13: 500 ug via INTRACORONARY

## 2017-07-13 MED ORDER — FLUTICASONE PROPIONATE 50 MCG/ACT NA SUSP: 1.0000 | Freq: Every day | NASAL | Status: DC

## 2017-07-13 MED ORDER — HEPARIN SODIUM (PORCINE) 5000 UNIT/ML IJ SOLN
INTRAMUSCULAR | Status: AC
  Filled 2017-07-13: qty 1

## 2017-07-13 MED ORDER — LEVOTHYROXINE SODIUM 25 MCG PO TABS
137.0000 ug | ORAL_TABLET | Freq: Every day | ORAL | Status: DC
  Administered 2017-07-13 – 2017-07-17 (×5): 137 ug via ORAL
  Filled 2017-07-13 (×5): qty 1

## 2017-07-13 MED ORDER — SODIUM CHLORIDE 0.9% FLUSH
10.0000 mL | Freq: Two times a day (BID) | INTRAVENOUS | Status: DC
  Administered 2017-07-13: 20 mL
  Administered 2017-07-14 – 2017-07-16 (×4): 10 mL

## 2017-07-13 MED ORDER — VITAMIN D 1000 UNITS PO TABS
2000.0000 [IU] | ORAL_TABLET | Freq: Every day | ORAL | Status: DC
  Administered 2017-07-15 – 2017-07-16 (×2): 2000 [IU] via ORAL
  Filled 2017-07-13 (×3): qty 2

## 2017-07-13 MED ORDER — HEPARIN SODIUM (PORCINE) 1000 UNIT/ML IJ SOLN
3000.0000 [IU] | Freq: Once | INTRAMUSCULAR | Status: AC
  Administered 2017-07-13: 3000 [IU] via INTRAVENOUS
  Filled 2017-07-13: qty 3

## 2017-07-13 MED ORDER — PANTOPRAZOLE SODIUM 40 MG PO TBEC
40.0000 mg | DELAYED_RELEASE_TABLET | Freq: Every day | ORAL | Status: DC
  Administered 2017-07-13 – 2017-07-17 (×5): 40 mg via ORAL
  Filled 2017-07-13 (×6): qty 1

## 2017-07-13 MED ORDER — CARVEDILOL 3.125 MG PO TABS
3.1250 mg | ORAL_TABLET | Freq: Two times a day (BID) | ORAL | Status: DC
  Administered 2017-07-15 – 2017-07-17 (×4): 3.125 mg via ORAL
  Filled 2017-07-13 (×4): qty 1

## 2017-07-13 MED ORDER — DOPAMINE-DEXTROSE 3.2-5 MG/ML-% IV SOLN
INTRAVENOUS | Status: DC | PRN
  Administered 2017-07-13: 10 ug/kg/min via INTRAVENOUS

## 2017-07-13 MED ORDER — SODIUM CHLORIDE 0.9 % IV SOLN: INTRAVENOUS | Status: DC | PRN

## 2017-07-13 MED ORDER — ONDANSETRON HCL 4 MG/2ML IJ SOLN
INTRAMUSCULAR | Status: DC | PRN
  Administered 2017-07-13: 4 mg via INTRAVENOUS

## 2017-07-13 MED ORDER — TIROFIBAN (AGGRASTAT) BOLUS VIA INFUSION
INTRAVENOUS | Status: DC | PRN
  Administered 2017-07-13: 2437.5 ug via INTRAVENOUS

## 2017-07-13 MED ORDER — TIROFIBAN HCL IN NACL 5-0.9 MG/100ML-% IV SOLN
INTRAVENOUS | Status: AC
  Filled 2017-07-13: qty 100

## 2017-07-13 MED ORDER — TICAGRELOR 90 MG PO TABS
ORAL_TABLET | ORAL | Status: DC | PRN
  Administered 2017-07-13 (×2): 180 mg via ORAL

## 2017-07-13 MED ORDER — ONDANSETRON HCL 4 MG/2ML IJ SOLN: 4.0000 mg | Freq: Four times a day (QID) | INTRAMUSCULAR | Status: DC | PRN

## 2017-07-13 MED ORDER — SODIUM CHLORIDE 0.9 % WEIGHT BASED INFUSION: 1.0000 mL/kg/h | INTRAVENOUS | Status: AC

## 2017-07-13 MED ORDER — VERAPAMIL HCL 2.5 MG/ML IV SOLN
INTRAVENOUS | Status: AC
  Filled 2017-07-13: qty 2

## 2017-07-13 MED ORDER — ADULT MULTIVITAMIN W/MINERALS CH
1.0000 | ORAL_TABLET | Freq: Every day | ORAL | Status: DC
  Administered 2017-07-15 – 2017-07-16 (×2): 1 via ORAL
  Filled 2017-07-13 (×3): qty 1

## 2017-07-13 MED ORDER — FENTANYL CITRATE (PF) 100 MCG/2ML IJ SOLN
50.0000 ug | INTRAMUSCULAR | Status: DC
  Administered 2017-07-13 – 2017-07-15 (×4): 50 ug via INTRAVENOUS
  Filled 2017-07-13 (×10): qty 2

## 2017-07-13 MED ORDER — PAROXETINE HCL 20 MG PO TABS: 20.0000 mg | ORAL_TABLET | Freq: Every day | ORAL | Status: DC

## 2017-07-13 MED ORDER — HEPARIN (PORCINE) IN NACL 100-0.45 UNIT/ML-% IJ SOLN
800.0000 [IU]/h | INTRAMUSCULAR | Status: DC
  Administered 2017-07-13: 500 [IU]/h via INTRAVENOUS
  Administered 2017-07-14: 1000 [IU]/h via INTRAVENOUS
  Filled 2017-07-13 (×2): qty 250

## 2017-07-13 MED ORDER — TICAGRELOR 90 MG PO TABS
ORAL_TABLET | ORAL | Status: AC
  Filled 2017-07-13: qty 2

## 2017-07-13 MED ORDER — IOPAMIDOL (ISOVUE-370) INJECTION 76%
INTRAVENOUS | Status: AC
  Filled 2017-07-13: qty 100

## 2017-07-13 MED ORDER — IOPAMIDOL (ISOVUE-370) INJECTION 76%
INTRAVENOUS | Status: AC
Start: 2017-07-13 — End: 2017-07-13
  Filled 2017-07-13: qty 125

## 2017-07-13 MED ORDER — NITROGLYCERIN 0.4 MG SL SUBL: 0.4000 mg | SUBLINGUAL_TABLET | SUBLINGUAL | Status: DC | PRN

## 2017-07-13 MED ORDER — IOPAMIDOL (ISOVUE-370) INJECTION 76%
INTRAVENOUS | Status: DC | PRN
  Administered 2017-07-13: 125 mL via INTRAVENOUS

## 2017-07-13 MED ORDER — HEPARIN SODIUM (PORCINE) 1000 UNIT/ML IJ SOLN
INTRAMUSCULAR | Status: DC | PRN
  Administered 2017-07-13: 3000 [IU] via INTRAVENOUS
  Administered 2017-07-13: 5000 [IU] via INTRAVENOUS
  Administered 2017-07-13: 6000 [IU] via INTRAVENOUS
  Administered 2017-07-13: 4000 [IU] via INTRAVENOUS

## 2017-07-13 MED ORDER — TIROFIBAN HCL IN NACL 5-0.9 MG/100ML-% IV SOLN
INTRAVENOUS | Status: AC | PRN
  Administered 2017-07-13: 0.15 ug/kg/min via INTRAVENOUS

## 2017-07-13 MED ORDER — DOPAMINE-DEXTROSE 3.2-5 MG/ML-% IV SOLN
INTRAVENOUS | Status: AC
  Filled 2017-07-13: qty 250

## 2017-07-13 MED ORDER — FENTANYL CITRATE (PF) 100 MCG/2ML IJ SOLN
50.0000 ug | INTRAMUSCULAR | Status: DC | PRN
  Administered 2017-07-13 – 2017-07-16 (×17): 50 ug via INTRAVENOUS
  Filled 2017-07-13 (×13): qty 2

## 2017-07-13 MED ORDER — VERAPAMIL HCL 2.5 MG/ML IV SOLN
INTRA_ARTERIAL | Status: DC | PRN
  Administered 2017-07-13: 3 mL via INTRA_ARTERIAL

## 2017-07-13 MED ORDER — HEPARIN (PORCINE) IN NACL 2-0.9 UNIT/ML-% IJ SOLN
INTRAMUSCULAR | Status: AC | PRN
  Administered 2017-07-13: 1500 mL

## 2017-07-13 MED ORDER — HEPARIN (PORCINE) IN NACL 2-0.9 UNIT/ML-% IJ SOLN
INTRAMUSCULAR | Status: AC
  Filled 2017-07-13: qty 1500

## 2017-07-13 MED ORDER — FENTANYL CITRATE (PF) 100 MCG/2ML IJ SOLN
INTRAMUSCULAR | Status: AC
  Filled 2017-07-13: qty 2

## 2017-07-13 MED ORDER — HEPARIN SODIUM (PORCINE) 1000 UNIT/ML IJ SOLN
INTRAMUSCULAR | Status: AC
  Filled 2017-07-13: qty 1

## 2017-07-13 MED ORDER — ONDANSETRON HCL 4 MG/2ML IJ SOLN
4.0000 mg | Freq: Once | INTRAMUSCULAR | Status: AC
  Administered 2017-07-13: 4 mg via INTRAVENOUS

## 2017-07-13 MED ORDER — LIDOCAINE HCL (PF) 1 % IJ SOLN
INTRAMUSCULAR | Status: AC
  Filled 2017-07-13: qty 30

## 2017-07-13 MED ORDER — ROSUVASTATIN CALCIUM 10 MG PO TABS
40.0000 mg | ORAL_TABLET | Freq: Every day | ORAL | Status: DC
Start: 2017-07-13 — End: 2017-07-17
  Administered 2017-07-13 – 2017-07-16 (×4): 40 mg via ORAL
  Filled 2017-07-13 (×4): qty 2
  Filled 2017-07-13: qty 4
  Filled 2017-07-13: qty 2

## 2017-07-13 MED ORDER — ALPRAZOLAM 0.5 MG PO TABS: 1.0000 mg | ORAL_TABLET | Freq: Every evening | ORAL | Status: DC | PRN

## 2017-07-13 MED ORDER — CHLORHEXIDINE GLUCONATE CLOTH 2 % EX PADS
6.0000 | MEDICATED_PAD | Freq: Every day | CUTANEOUS | Status: DC
  Administered 2017-07-14 – 2017-07-17 (×4): 6 via TOPICAL

## 2017-07-13 MED ORDER — NOREPINEPHRINE BITARTRATE 1 MG/ML IV SOLN
INTRAVENOUS | Status: DC | PRN
  Administered 2017-07-13: 10 ug/kg/min via INTRAVENOUS

## 2017-07-13 MED ORDER — ACETAMINOPHEN 325 MG PO TABS
650.0000 mg | ORAL_TABLET | ORAL | Status: DC | PRN
  Administered 2017-07-16: 650 mg via ORAL
  Filled 2017-07-13: qty 2

## 2017-07-13 MED ORDER — SODIUM CHLORIDE 0.9% FLUSH: 3.0000 mL | INTRAVENOUS | Status: DC | PRN

## 2017-07-13 MED ORDER — HEPARIN SODIUM (PORCINE) 5000 UNIT/ML IJ SOLN
4000.0000 [IU] | Freq: Once | INTRAMUSCULAR | Status: AC
  Administered 2017-07-13: 4000 [IU] via INTRAVENOUS

## 2017-07-13 MED ORDER — TICAGRELOR 90 MG PO TABS
ORAL_TABLET | ORAL | Status: AC
Start: 2017-07-13 — End: 2017-07-13
  Filled 2017-07-13: qty 2

## 2017-07-13 MED ORDER — HEPARIN SODIUM (PORCINE) 5000 UNIT/ML IJ SOLN
50000.0000 [IU] | INTRAVENOUS | Status: DC
  Filled 2017-07-13 (×2): qty 10

## 2017-07-13 MED ORDER — NITROGLYCERIN 1 MG/10 ML FOR IR/CATH LAB
INTRA_ARTERIAL | Status: AC
  Filled 2017-07-13: qty 10

## 2017-07-13 MED ORDER — TICAGRELOR 90 MG PO TABS
90.0000 mg | ORAL_TABLET | Freq: Two times a day (BID) | ORAL | Status: DC
  Administered 2017-07-13 – 2017-07-17 (×8): 90 mg via ORAL
  Filled 2017-07-13 (×8): qty 1

## 2017-07-13 MED ORDER — SODIUM CHLORIDE 0.9% FLUSH
3.0000 mL | Freq: Two times a day (BID) | INTRAVENOUS | Status: DC
  Administered 2017-07-13 – 2017-07-16 (×4): 3 mL via INTRAVENOUS

## 2017-07-13 MED ORDER — LIDOCAINE HCL (PF) 1 % IJ SOLN
INTRAMUSCULAR | Status: DC | PRN
  Administered 2017-07-13: 15 mL
  Administered 2017-07-13: 2 mL

## 2017-07-13 MED ORDER — SODIUM CHLORIDE 0.9 % IV SOLN: 250.0000 mL | INTRAVENOUS | Status: DC | PRN

## 2017-07-13 SURGICAL SUPPLY — 22 items
BALLN EUPHORA RX 2.5X15 (BALLOONS) ×2
BALLOON EUPHORA RX 2.5X15 (BALLOONS) ×1 IMPLANT
CATH EXTRAC PRONTO 5.5F 138CM (CATHETERS) ×2 IMPLANT
CATH HEARTRAIL 6F IL3.5 (CATHETERS) ×2 IMPLANT
CATH INFINITI 5FR ANG PIGTAIL (CATHETERS) ×2 IMPLANT
DEVICE RAD COMP TR BAND LRG (VASCULAR PRODUCTS) ×2 IMPLANT
GLIDESHEATH SLEND A-KIT 6F 20G (SHEATH) ×2 IMPLANT
GUIDEWIRE INQWIRE 1.5J.035X260 (WIRE) ×1 IMPLANT
INQWIRE 1.5J .035X260CM (WIRE) ×2
KIT ENCORE 26 ADVANTAGE (KITS) ×2 IMPLANT
KIT HEART LEFT (KITS) ×2 IMPLANT
KIT MICROINTRODUCER STIFF 5F (SHEATH) ×2 IMPLANT
PACK CARDIAC CATHETERIZATION (CUSTOM PROCEDURE TRAY) ×2 IMPLANT
SET IMPELLA CP PUMP (CATHETERS) ×2 IMPLANT
SHEATH PINNACLE 8F 10CM (SHEATH) ×2 IMPLANT
STENT RESOLUTE ONYX 3.5X26 (Permanent Stent) ×2 IMPLANT
TRANSDUCER W/STOPCOCK (MISCELLANEOUS) ×2 IMPLANT
TUBING CIL FLEX 10 FLL-RA (TUBING) ×2 IMPLANT
WIRE COUGAR XT STRL 190CM (WIRE) ×2 IMPLANT
WIRE EMERALD 3MM-J .035X150CM (WIRE) ×2 IMPLANT
WIRE HI TORQ BMW 190CM (WIRE) ×2 IMPLANT
WIRE PT2 MS 185 (WIRE) ×2 IMPLANT

## 2017-07-13 NOTE — Progress Notes (Signed)
    Called in to assist by Dr. Einar Gip for Impella placement in this patient with large anterior wall MI and cardiogenic shock.  He was on Dopamine for maintenance of BP and hemodynamic support was required.    Femoral angio showed large common right femoral artery.  Impella sheath was placed and pigtail catheter was placed in the LV.  0.018 wire was advanced to the LV through the pigtail catheter.  The catheter was removed.  The Impella was placed and then the repositioning sheath was placed.  Groin was stable.    Patient will be transported to the unit and echo will be done for further positioning of the sheath.  Patient tolerated the procedure well.   Jettie Booze, MD

## 2017-07-13 NOTE — ED Notes (Signed)
Pt ready for cath lab

## 2017-07-13 NOTE — Progress Notes (Signed)
Per Dr. Nadyne Coombes, okay to place PIV in right arm.

## 2017-07-13 NOTE — H&P (Signed)
Bobby [name] is an 46 y.o. male.   Chief Complaint: Chest pain HPI: Bobby [name]  is a 46 y.o. male  With known coronary artery disease, has had angioplasty to occluded right coronary artery when he presented with MI in 2003 with implantation of 2 drug-eluting stents. He been doing well until about 3-4 months ago started having chest discomfort with radiation to his jaw with exertional activity, but did not think much about this. Had severe chest pain 4 days ago with radiation to the back associated with nausea and radiation to jaw that lasted for 30 minutes and again was ignored, this morning woke up with severe crushing chest discomfort and associated nausea, diaphoresis. Initial EKG had revealed presence of early repolarization without reciprocal ST changes, however on presentation to the emergency room ST changes was much more prominent and with his clinical presentation of chest pain, nausea, diaphoresis STEMI was activated and brought to the cardiac catheterization lab on emergent basis.  Past Medical History:  Diagnosis Date  . Graves disease   . Hypertension   . MI (myocardial infarction) ([place])   . Thyroid disease    thyroid irradiated per pt    Past Surgical History:  Procedure Laterality Date  . CORONARY ANGIOPLASTY WITH STENT PLACEMENT    . SKIN GRAFT      Family History  Problem Relation Age of Onset  . Heart disease Father   . Heart disease Paternal Grandmother    Social History:  reports that he has been smoking Cigarettes.  He has been smoking about 0.50 packs per day. He has never used smokeless tobacco. He reports that he drinks alcohol. He reports that he does not use drugs.  Allergies: No Known Allergies  Review of Systems - Negative except Nausea, chest pain and diaphoresis, denies dyspnea. No GI bleed, no TIA or claudication.    Blood pressure 101/81, pulse 75, temperature 97.9 F (36.6 C), temperature source Oral, resp. rate 16, SpO2 100 %. There is no height or  weight on file to calculate BMI.  General appearance: alert, cooperative, appears stated age and mild distress Eyes: negative findings: lids and lashes normal Neck: no adenopathy, no carotid bruit, no JVD, supple, symmetrical, trachea midline and thyroid not enlarged, symmetric, no tenderness/mass/nodules Neck: JVP - normal, carotids 2+= without bruits Resp: clear to auscultation bilaterally Chest wall: no tenderness Cardio: regular rate and rhythm, S1, S2 normal, no murmur, click, rub or gallop GI: soft, non-tender; bowel sounds normal; no masses,  no organomegaly Extremities: extremities normal, atraumatic, no cyanosis or edema Pulses: feeble peripheral pulses Skin: Skin color, texture, turgor normal. No rashes or lesions or diaphoretic Neurologic: Grossly normal  Results for orders placed or performed during the hospital encounter of 07/13/17 (from the past 48 hour(s))  Basic metabolic panel     Status: Abnormal   Collection Time: 07/13/17  2:19 AM  Result Value Ref Range   Sodium 137 135 - 145 mmol/L   Potassium 3.8 3.5 - 5.1 mmol/L   Chloride 106 101 - 111 mmol/L   CO2 23 22 - 32 mmol/L   Glucose, Bld 158 (H) 65 - 99 mg/dL   BUN 15 6 - 20 mg/dL   Creatinine, Ser 1.42 (H) 0.61 - 1.24 mg/dL   Calcium 8.8 (L) 8.9 - 10.3 mg/dL   GFR calc non Af Amer 58 (L) >60 mL/min   GFR calc Af Amer >60 >60 mL/min    Comment: (NOTE) The eGFR has been calculated using the CKD  EPI equation. This calculation has not been validated in all clinical situations. eGFR's persistently <60 mL/min signify possible Chronic Kidney Disease.    Anion gap 8 5 - 15  CBC     Status: Abnormal   Collection Time: 07/13/17  2:19 AM  Result Value Ref Range   WBC 14.1 (H) 4.0 - 10.5 K/uL   RBC 4.91 4.22 - 5.81 MIL/uL   Hemoglobin 15.5 13.0 - 17.0 g/dL   HCT 44.2 39.0 - 52.0 %   MCV 90.0 78.0 - 100.0 fL   MCH 31.6 26.0 - 34.0 pg   MCHC 35.1 30.0 - 36.0 g/dL   RDW 13.1 11.5 - 15.5 %   Platelets 193 150 - 400  K/uL  I-stat troponin, ED     Status: None   Collection Time: 07/13/17  2:21 AM  Result Value Ref Range   Troponin i, poc 0.01 0.00 - 0.08 ng/mL   Comment 3            Comment: Due to the release kinetics of cTnI, a negative result within the first hours of the onset of symptoms does not rule out myocardial infarction with certainty. If myocardial infarction is still suspected, repeat the test at appropriate intervals.     Labs:   Lab Results  Component Value Date   WBC 14.1 (H) 07/13/2017   HGB 15.5 07/13/2017   HCT 44.2 07/13/2017   MCV 90.0 07/13/2017   PLT 193 07/13/2017    Recent Labs Lab 07/13/17 0219  NA 137  K 3.8  CL 106  CO2 23  BUN 15  CREATININE 1.42*  CALCIUM 8.8*  GLUCOSE 158*    Lipid Panel  No results found for: CHOL, TRIG, HDL, CHOLHDL, VLDL, LDLCALC  BNP (last 3 results) No results for input(s): BNP in the last 8760 hours.  HEMOGLOBIN A1C No results found for: HGBA1C, MPG  Cardiac Panel (last 3 results) No results for input(s): CKTOTAL, CKMB, TROPONINI, RELINDX in the last 8760 hours.  Lab Results  Component Value Date   CKTOTAL 51 03/16/2012   CKMB 1.9 03/16/2012   TROPONINI <0.30 03/16/2012     TSH No results for input(s): TSH in the last 8760 hours.   Medications Prior to Admission  Medication Sig Dispense Refill  . ALPRAZolam (XANAX) 1 MG tablet 1 mg.    . ANDROGEL PUMP 20.25 MG/ACT (1.62%) GEL     . aspirin 81 MG tablet Take 81 mg by mouth daily.    Marland Kitchen atenolol (TENORMIN) 25 MG tablet Take by mouth daily.    . cholecalciferol (VITAMIN D) 1000 UNITS tablet Take 2,000 Units by mouth daily.    . clopidogrel (PLAVIX) 75 MG tablet 75 mg.    . CRESTOR 10 MG tablet 20 mg.     . fluticasone (FLONASE) 50 MCG/ACT nasal spray     . levothyroxine (SYNTHROID, LEVOTHROID) 137 MCG tablet Take 137 mcg by mouth daily before breakfast.    . lisinopril (PRINIVIL,ZESTRIL) 10 MG tablet Take 10 mg by mouth daily.    . montelukast (SINGULAIR) 10  MG tablet Take 10 mg by mouth at bedtime.    . Multiple Vitamins-Minerals (CENTRUM SILVER PO) Take by mouth daily.    Marland Kitchen NEXIUM 40 MG capsule 40 mg.    . NITROSTAT 0.4 MG SL tablet     . PARoxetine (PAXIL) 20 MG tablet         Current Facility-Administered Medications:  .  DOPamine (INTROPIN) 800 mg in dextrose 5 %  250 mL (3.2 mg/mL) infusion, , , Continuous PRN, Adrian Prows, MD, Stopped at 07/13/17 [phone] .  fentaNYL (SUBLIMAZE) injection, , , PRN, Adrian Prows, MD, 25 mcg at 07/13/17 0402 .  heparin 5000 UNIT/ML injection, , , ,  .  heparin infusion 2 units/mL in 0.9 % sodium chloride, , , Continuous PRN, Adrian Prows, MD, 1,500 mL at 07/13/17 0304 .  heparin injection, , , PRN, Adrian Prows, MD, 4,000 Units at 07/13/17 0408 .  iopamidol (ISOVUE-370) 76 % injection, , , PRN, Adrian Prows, MD, 125 mL at 07/13/17 0439 .  lidocaine (PF) (XYLOCAINE) 1 % injection, , , PRN, Adrian Prows, MD, 15 mL at 07/13/17 0318 .  norepinephrine (LEVOPHED) 4 mg in dextrose 5 % 250 mL (0.016 mg/mL) infusion, , , Continuous PRN, Adrian Prows, MD, Stopped at 07/13/17 0348 .  ondansetron (ZOFRAN) 4 MG/2ML injection, , , ,  .  ondansetron (ZOFRAN) injection, , , PRN, Adrian Prows, MD, 4 mg at 07/13/17 0409 .  Radial Cocktail (Verapamil 2.5 mg, NTG, Lidocaine), , , PRN, Adrian Prows, MD, 3 mL at 07/13/17 0302 .  ticagrelor (BRILINTA) tablet, , , PRN, Adrian Prows, MD, 180 mg at 07/13/17 0328 .  TIROFIBAN (AGGRASTAT) 50MCG/ML FOR INTRACORONARY USE, , , PRN, Adrian Prows, MD, 500 mcg at 07/13/17 0329 .  tirofiban (AGGRASTAT) bolus via infusion, , , PRN, Adrian Prows, MD, 2,437.5 mcg at 07/13/17 0321 .  tirofiban (AGGRASTAT) infusion 50 mcg/mL 100 mL, , , Continuous PRN, Adrian Prows, MD, Last Rate: 17.6 mL/hr at 07/13/17 0325, 0.15 mcg/kg/min at 07/13/17 0325  CARDIAC STUDIES:  EKG 07/11/2017: Normal sinus rhythm, ST elevation in V1 through V6, nonspecific ST changes in the inferior leads.  Assessment/Plan 1. Acute anterior and lateral  ST elevation myocardial infarction with cardiogenic shock 2. History of coronary artery disease and myocardial infarction in 2003 with history of 3.0 x 33 mm 2 Cypher stents to the right coronary artery 3. Hypertension 4. Hyperlipidemia  Recommendation: Patient in pre-cardiogenic shock with systolic blood pressure around 90 mmHg, ongoing severe chest discomfort, we'll proceed directly with cardiac catheterization.  Adrian Prows, MD 07/13/2017, 4:41 AM [facility]. [place] Pager: [phone] Office: [phone] If no answer: Cell:  [phone]

## 2017-07-13 NOTE — ED Notes (Signed)
Cardiology at bedside.

## 2017-07-13 NOTE — ED Triage Notes (Signed)
Pt arrives via EMS from home after awaking with CP, diaphoretic. Got up and took 1SL nitro and 324 MG aspirin. Hyperventilating on EMS arrival. Code stemi called in the field but canceled by cards prior to pt's arrival. Pt's cardiology Dr. Einar Gip. Pt received 10MG  morphine PTA by EMS.

## 2017-07-13 NOTE — Progress Notes (Signed)
Aggrastat bottle completed at this time. Aggrastat turned off per Dr. Nadyne Coombes.

## 2017-07-13 NOTE — ED Provider Notes (Signed)
[place] DEPT Provider Note   CSN: [id number] Arrival date & time: 07/13/17  0205  By signing my name below, I, [name], attest that this documentation has been prepared under the direction and in the presence of [name], MD. Electronically Signed: [name], Scribe. 07/13/17. 2:25 AM. History   Chief Complaint No chief complaint on file.  The history is provided by the patient. No language interpreter was used.  Chest Pain   This is a chronic problem. The current episode started less than 1 hour ago. The problem occurs constantly. The problem has been gradually worsening. The pain is associated with breathing. The pain is at a severity of 5/10. The pain is severe. The quality of the pain is described as sharp. Associated symptoms include diaphoresis and shortness of breath. He has tried nitroglycerin for the symptoms. The treatment provided mild relief. Risk factors include male gender.  His past medical history is significant for congenital heart disease and MI.    HPI Comments: Bobby [name] is a 46 y.o. male who presents to the Emergency Department complaining of constant, gradually worsening CP that began prior to arrival. He states he woke up due to CP, felt cold, clammy, and sweaty, and took 1 ntg and 324 ASA. His pain worsened, so he called EMS. Per EMS, his BP was 85/43 and was SOB. Pt has had stents placed with 3 other blockages in the past as well. He states his current pain feels different from his previous MI. A code STEMI was called but was canceled per cardiology. Past Medical History:  Diagnosis Date  . Hypertension   . MI (myocardial infarction)     Patient Active Problem List   Diagnosis Date Noted  . Unspecified hypothyroidism 08/27/2013  . Tachycardia 03/15/2012  . CAD (coronary artery disease) 03/15/2012  . HTN (hypertension) 03/15/2012  . Anxiety 03/15/2012    No past surgical history on file.     Home Medications    Prior to Admission  medications   Medication Sig Start Date End Date Taking? Authorizing Provider  ALPRAZolam Duanne Moron) 1 MG tablet 1 mg. 08/04/13   [provider]  ANDROGEL PUMP 20.25 MG/ACT (1.62%) GEL  09/24/14   [provider]  aspirin 81 MG tablet Take 81 mg by mouth daily.    [provider]  atenolol (TENORMIN) 25 MG tablet Take by mouth daily.    [provider]  cholecalciferol (VITAMIN D) 1000 UNITS tablet Take 2,000 Units by mouth daily.    [provider]  clopidogrel (PLAVIX) 75 MG tablet 75 mg. 09/17/14   [provider]  CRESTOR 10 MG tablet 20 mg.  08/05/13   [provider]  fluticasone Asencion Islam) 50 MCG/ACT nasal spray  08/22/13   [provider]  levothyroxine (SYNTHROID, LEVOTHROID) 137 MCG tablet Take 137 mcg by mouth daily before breakfast.    [provider]  lisinopril (PRINIVIL,ZESTRIL) 10 MG tablet Take 10 mg by mouth daily.    [provider]  montelukast (SINGULAIR) 10 MG tablet Take 10 mg by mouth at bedtime.    [provider]  Multiple Vitamins-Minerals (CENTRUM SILVER PO) Take by mouth daily.    [provider]  NEXIUM 40 MG capsule 40 mg. 08/04/13   [provider]  NITROSTAT 0.4 MG SL tablet  06/23/14   [provider]  PARoxetine (PAXIL) 20 MG tablet  09/16/14   [provider]    Family History Family History  Problem Relation Age  of Onset  . Heart disease Father   . Heart disease Paternal Grandmother     Social History Social History  Substance Use Topics  . Smoking status: Former Research scientist (life sciences)  . Smokeless tobacco: Never Used     Comment: quit 2012  . Alcohol use Yes     Comment: occ     Allergies   Patient has no known allergies.   Review of Systems Review of Systems  Constitutional: Positive for diaphoresis.  Respiratory: Positive for shortness of breath.   Cardiovascular: Positive for chest pain.  All other systems reviewed and are  negative.    Physical Exam Updated Vital Signs BP 101/81 (BP Location: Right Arm)   Pulse 75   Temp 97.9 F (36.6 C) (Oral)   Resp 16   SpO2 100%   Physical Exam  Constitutional: He is oriented to person, place, and time. He appears well-developed and well-nourished.  HENT:  Head: Normocephalic and atraumatic.  Eyes: EOM are normal.  Neck: Normal range of motion.  Cardiovascular: Normal rate, regular rhythm, normal heart sounds and intact distal pulses.   Pulmonary/Chest: Effort normal and breath sounds normal. No respiratory distress.  Abdominal: Soft. He exhibits no distension. There is no tenderness.  Musculoskeletal: Normal range of motion.  Neurological: He is alert and oriented to person, place, and time.  Skin: Skin is warm and dry.  Psychiatric: He has a normal mood and affect. Judgment normal.  Nursing note and vitals reviewed.    ED Treatments / Results  DIAGNOSTIC STUDIES: Oxygen Saturation is 100% on RA, normal by my interpretation.    COORDINATION OF CARE: 2:25 AM Discussed treatment plan with pt at bedside and pt agreed to plan.  Labs (all labs ordered are listed, but only abnormal results are displayed) Labs Reviewed - No data to display  EKG  EKG Interpretation None       Radiology No results found.  Procedures Procedures (including critical care time)  Medications Ordered in ED Medications - No data to display   Initial Impression / Assessment and Plan / ED Course  I have reviewed the triage vital signs and the nursing notes.  Pertinent labs & imaging results that were available during my care of the patient were reviewed by me and considered in my medical decision making (see chart for details).  Patient was transported here by EMS after waking up with severe substernal chest pain with associated nausea and diaphoresis. He has a past medical history significant for coronary artery disease and had stents placed in his early 67s.  A  code STEMI was initially called due to the patient's symptoms and concerning EKG, however was canceled shortly thereafter by Dr. Einar Gip after he had reviewed the EKG.  I evaluated the patient immediately upon arrival to the emergency department. He was continuing to experience significant discomfort and the EKG obtained in the emergency department showed what I believe to be an acute anterior wall MI. He had 2 mm of ST elevation in V2, V3, and V4 with possible T-wave depressions in the inferior leads.  I then reinitiated the code STEMI and spoke personally with Dr. Einar Gip. He is on his way to the hospital take the patient to the Cath Lab. He took 325 mg of aspirin at home and was given heparin here.  CRITICAL CARE Performed by: [name] Total critical care time: 35 minutes Critical care time was exclusive of separately billable procedures and treating other patients. Critical care was necessary to treat  or prevent imminent or life-threatening deterioration. Critical care was time spent personally by me on the following activities: development of treatment plan with patient and/or surrogate as well as nursing, discussions with consultants, evaluation of patient's response to treatment, examination of patient, obtaining history from patient or surrogate, ordering and performing treatments and interventions, ordering and review of laboratory studies, ordering and review of radiographic studies, pulse oximetry and re-evaluation of patient's condition.   Final Clinical Impressions(s) / ED Diagnoses   Final diagnoses:  None    New Prescriptions New Prescriptions   No medications on file  I personally performed the services described in this documentation, which was scribed in my presence. The recorded information has been reviewed and is accurate.       [name], MD 07/13/17 [phone]

## 2017-07-13 NOTE — Progress Notes (Signed)
Peripherally Inserted Central Catheter/Midline Placement  The IV Nurse has discussed with the patient and/or persons authorized to consent for the patient, the purpose of this procedure and the potential benefits and risks involved with this procedure.  The benefits include less needle sticks, lab draws from the catheter, and the patient may be discharged home with the catheter. Risks include, but not limited to, infection, bleeding, blood clot (thrombus formation), and puncture of an artery; nerve damage and irregular heartbeat and possibility to perform a PICC exchange if needed/ordered by physician.  Alternatives to this procedure were also discussed.  Bard Power PICC patient education guide, fact sheet on infection prevention and patient information card has been provided to patient /or left at bedside.    PICC/Midline Placement Documentation        Bobby [name] 07/13/2017, 12:16 PM

## 2017-07-13 NOTE — Progress Notes (Signed)
Orthopedic Tech Progress Note Patient Details:  Bobby [name] July 03, 1971 [id number]  Ortho Devices Type of Ortho Device: Knee Immobilizer   Bobby [name] 07/13/2017, 8:58 AM

## 2017-07-13 NOTE — Progress Notes (Signed)
Dr. Nadyne Coombes notified of troponin >65.

## 2017-07-13 NOTE — Progress Notes (Signed)
ANTICOAGULATION CONSULT NOTE - Follow Up Consult  Pharmacy Consult for Heparin  Indication: Impella pump in place, s/p cath  No Known Allergies   Vital Signs: Temp: 98 F (36.7 C) (07/20 0757) Temp Source: Oral (07/20 0757) BP: 102/76 (07/20 1130) Pulse Rate: 80 (07/20 1130)  Labs:  Recent Labs  07/13/17 0219 07/13/17 0740  HGB 15.5  --   HCT 44.2  --   PLT 193  --   CREATININE 1.42*  --   TROPONINI  --  >65.00*   Medical History: Past Medical History:  Diagnosis Date  . Graves disease   . Hypertension   . MI (myocardial infarction) ([place])   . Thyroid disease    thyroid irradiated per pt    Assessment: 46 y/o M Hx CAD - DES in RCA about 4yr ago - CODE STEMI DES to LAD and D1  now s/p cath with Impella in place.  ACTs have been low about 130 with purge only running at 14.40ml/hr = 750unit/hr of heparin.  Plan to being heparin drip and recheck ACTs.  CBC stable , no bleeding.  Will also check HL to ensure no supratherapeutic  Goal of Therapy:  ACT 160-180  Monitor platelets by anticoagulation protocol: Yes   Plan:  -Systemic heparin 500 uts/hr  -RN will check ACT q1h until in target range for 3 hours, then check q4h -RN will adjust heparin rate as needed Check HL in 6hr and then daily  Bonnita Nasuti Pharm.D. CPP, BCPS Clinical Pharmacist [phone] 07/13/2017 2:19 PM

## 2017-07-13 NOTE — Progress Notes (Signed)
  Echocardiogram 2D Echocardiogram has been performed.  Bobby [name] 07/13/2017, 5:36 AM

## 2017-07-13 NOTE — Plan of Care (Signed)
Problem: Activity: Goal: Ability to tolerate increased activity will improve Outcome: Not Progressing Due to Impella device and immobility of right leg.   Problem: Pain Managment: Goal: General experience of comfort will improve Outcome: Progressing Current pain medication regimen controlling pain.

## 2017-07-13 NOTE — ED Notes (Signed)
No answer in cath lab, plan to go to cath lab per Dr. Einar Gip.

## 2017-07-13 NOTE — ED Notes (Signed)
Escorted to cath lab by this RN, bedside report given to cath lab team.

## 2017-07-13 NOTE — Progress Notes (Signed)
ANTICOAGULATION CONSULT NOTE - Initial Consult  Pharmacy Consult for Heparin  Indication: Impella pump in place, s/p cath  No Known Allergies   Vital Signs: Temp: 97.9 F (36.6 C) (07/20 0216) Temp Source: Oral (07/20 0216) BP: 96/66 (07/20 0517) Pulse Rate: 96 (07/20 0517)  Labs:  Recent Labs  07/13/17 0219  HGB 15.5  HCT 44.2  PLT 193  CREATININE 1.42*   Medical History: Past Medical History:  Diagnosis Date  . Graves disease   . Hypertension   . MI (myocardial infarction) ([place])   . Thyroid disease    thyroid irradiated per pt    Assessment: 46 y/o M CODE STEMI now s/p cath with Impella in place  Goal of Therapy:  ACT 160-180 Monitor platelets by anticoagulation protocol: Yes   Plan:  -Systemic heparin as needed per Impella protocol to keep ACT at goal range -RN will check ACT q1h until in target range for 3 hours, then check q4h -RN will adjust heparin rate as needed  Narda Bonds 07/13/2017,6:34 AM

## 2017-07-14 ENCOUNTER — Inpatient Hospital Stay (HOSPITAL_COMMUNITY): Payer: 59

## 2017-07-14 DIAGNOSIS — R570 Cardiogenic shock: Secondary | ICD-10-CM

## 2017-07-14 LAB — BASIC METABOLIC PANEL
ANION GAP: 5 (ref 5–15)
BUN: 13 mg/dL (ref 6–20)
CO2: 25 mmol/L (ref 22–32)
Calcium: 8.3 mg/dL — ABNORMAL LOW (ref 8.9–10.3)
Chloride: 109 mmol/L (ref 101–111)
Creatinine, Ser: 1.21 mg/dL (ref 0.61–1.24)
Glucose, Bld: 144 mg/dL — ABNORMAL HIGH (ref 65–99)
POTASSIUM: 3.9 mmol/L (ref 3.5–5.1)
SODIUM: 139 mmol/L (ref 135–145)

## 2017-07-14 LAB — CBC
HEMATOCRIT: 36.7 % — AB (ref 39.0–52.0)
HEMOGLOBIN: 12.4 g/dL — AB (ref 13.0–17.0)
MCH: 30.4 pg (ref 26.0–34.0)
MCHC: 33.8 g/dL (ref 30.0–36.0)
MCV: 90 fL (ref 78.0–100.0)
Platelets: 133 10*3/uL — ABNORMAL LOW (ref 150–400)
RBC: 4.08 MIL/uL — AB (ref 4.22–5.81)
RDW: 13.4 % (ref 11.5–15.5)
WBC: 10.7 10*3/uL — AB (ref 4.0–10.5)

## 2017-07-14 LAB — POCT ACTIVATED CLOTTING TIME
ACTIVATED CLOTTING TIME: 131 s
ACTIVATED CLOTTING TIME: 131 s
ACTIVATED CLOTTING TIME: 136 s
ACTIVATED CLOTTING TIME: 142 s
ACTIVATED CLOTTING TIME: 147 s
ACTIVATED CLOTTING TIME: 169 s
ACTIVATED CLOTTING TIME: 169 s
ACTIVATED CLOTTING TIME: 180 s
ACTIVATED CLOTTING TIME: 186 s
Activated Clotting Time: 136 seconds
Activated Clotting Time: 131 seconds
Activated Clotting Time: 142 seconds
Activated Clotting Time: 153 seconds
Activated Clotting Time: 164 seconds
Activated Clotting Time: 191 seconds
Activated Clotting Time: 197 seconds

## 2017-07-14 LAB — LIPID PANEL
CHOL/HDL RATIO: 3.5 ratio
CHOLESTEROL: 130 mg/dL (ref 0–200)
HDL: 37 mg/dL — AB (ref >40)
LDL Cholesterol: 67 mg/dL (ref 0–99)
Triglycerides: 129 mg/dL (ref <150)
VLDL: 26 mg/dL (ref 0–40)

## 2017-07-14 LAB — ECHOCARDIOGRAM LIMITED

## 2017-07-14 LAB — HEMOGLOBIN A1C
Hgb A1c MFr Bld: 6 % — ABNORMAL HIGH (ref 4.8–5.6)
MEAN PLASMA GLUCOSE: 126 mg/dL

## 2017-07-14 LAB — ECHOCARDIOGRAM COMPLETE
Height: 73 in
Weight: 3569.69 oz

## 2017-07-14 LAB — HEPARIN LEVEL (UNFRACTIONATED): Heparin Unfractionated: 0.93 IU/mL — ABNORMAL HIGH (ref 0.30–0.70)

## 2017-07-14 NOTE — Progress Notes (Addendum)
ANTICOAGULATION CONSULT NOTE - Follow Up Consult  Pharmacy Consult for Heparin  Indication: Impella pump in place, s/p cath  No Known Allergies   Vital Signs: Temp: 98.3 F (36.8 C) (07/21 0800) Temp Source: Oral (07/21 0800) BP: 100/73 (07/21 0800) Pulse Rate: 87 (07/21 0800)  Labs:  Recent Labs  07/13/17 0219 07/13/17 0740 07/13/17 1442 07/13/17 2035 07/14/17 0357 07/14/17 0358  HGB 15.5  --   --   --  12.4*  --   HCT 44.2  --   --   --  36.7*  --   PLT 193  --   --   --  133*  --   HEPARINUNFRC  --   --   --  0.68  --  0.93*  CREATININE 1.42*  --   --   --  1.21  --   TROPONINI  --  >65.00* >65.00* >65.00*  --   --    Medical History: Past Medical History:  Diagnosis Date  . Graves disease   . Hypertension   . MI (myocardial infarction) ([place])   . Thyroid disease    thyroid irradiated per pt    Assessment: 46 y/o M Hx CAD - DES in RCA about 4yr ago - CODE STEMI DES to LAD and D1  now s/p cath with Impella in place. Most recent ACT above goal range at 191.  Heparin level drawn this morning = 0.93.  Slightly above normal therapeutic range, but ACTs at goal.  Per RN has slight hematoma at Impella site.  Patient receiving heparin at total rate of 2080 units/hr (Impella purge + systemic heparin).  Goal of Therapy:  ACT 160-180  Monitor platelets by anticoagulation protocol: Yes   Plan:  -Decrease systemic heparin to 1200 units/hr.  -RN will check ACT q1h until in target range for 3 hours, then check q4h -RN will adjust heparin rate as needed  Uvaldo Rising, BCPS  Clinical Pharmacist Pager [phone]  07/14/2017 8:29 AM

## 2017-07-14 NOTE — Plan of Care (Signed)
Problem: Cardiac: Goal: Ability to achieve and maintain adequate cardiopulmonary perfusion will improve Outcome: Progressing Pt is tolerating lower Powers on the Impella machine.   Problem: Pain Managment: Goal: General experience of comfort will improve Outcome: Progressing Pt not experiencing any chest pain throughout the day. Patient experiencing right femoral pain from Impella placement, but the pain is controlled with PRN and scheduled pain medication.

## 2017-07-14 NOTE — Progress Notes (Signed)
Dr. Harrell Gave made aware of troponins elevated at greater than 65 post PCI and Impella placment. Pt has had no change in status. No new orders given.

## 2017-07-14 NOTE — Progress Notes (Signed)
Subjective:  Feels tired but otherwise no specific complaints. No further chest pain. No dyspnea, has mild back pain.  Objective:  Vital Signs in the last 24 hours: Temp:  [98.3 F (36.8 C)-99.3 F (37.4 C)] 98.7 F (37.1 C) (07/21 1213) Pulse Rate:  [80-105] 98 (07/21 1400) Resp:  [12-28] 21 (07/21 1400) BP: (85-115)/(59-91) 98/60 (07/21 1400) SpO2:  [92 %-99 %] 96 % (07/21 1400) Weight:  [101.2 kg (223 lb 1.7 oz)] 101.2 kg (223 lb 1.7 oz) (07/21 0400)  Intake/Output from previous day: 07/20 0701 - 07/21 0700 In: 2462 [P.O.:1320; I.V.:790.2] Out: 4395 [Urine:4395]  Physical Exam:   General appearance: alert, cooperative, appears stated age and no distress Eyes: negative findings: lids and lashes normal Neck: no adenopathy, no carotid bruit, no JVD, supple, symmetrical, trachea midline and thyroid not enlarged, symmetric, no tenderness/mass/nodules Neck: JVP - normal, carotids 2+= without bruits Resp: clear to auscultation bilaterally Chest wall: no tenderness Cardio: regular rate and rhythm, S1, S2 normal, no murmur, click, rub or gallop GI: soft, non-tender; bowel sounds normal; no masses,  no organomegaly Extremities: extremities normal, atraumatic, no cyanosis or edema and Right groin without hematoma. Bilateral lower Rosiland Oz is small with palpable pulses.  Lab Results: BMP  Recent Labs  07/13/17 0219 07/14/17 0357  NA 137 139  K 3.8 3.9  CL 106 109  CO2 23 25  GLUCOSE 158* 144*  BUN 15 13  CREATININE 1.42* 1.21  CALCIUM 8.8* 8.3*  GFRNONAA 58* >60  GFRAA >60 >60    CBC  Recent Labs Lab 07/14/17 0357  WBC 10.7*  RBC 4.08*  HGB 12.4*  HCT 36.7*  PLT 133*  MCV 90.0  MCH 30.4  MCHC 33.8  RDW 13.4    HEMOGLOBIN A1C Lab Results  Component Value Date   HGBA1C 6.0 (H) 07/13/2017   MPG 126 07/13/2017    Cardiac Panel (last 3 results)  Recent Labs  07/13/17 0740 07/13/17 1442 07/13/17 2035  TROPONINI >65.00* >65.00* >65.00*    BNP (last  3 results) No results for input(s): PROBNP in the last 8760 hours.  TSH  Recent Labs  07/13/17 0740  TSH 1.360   Imaging: Imaging results have been reviewed  Cardiac Studies:  EKG 07/14/2017: Normal sinus rhythm with rate of 90 bpm, leftward abnormality, poor progression, cannot exclude anterior infarct old. No evidence of ischemia.  ECHO: 07/14/17:  Left ventricle: The cavity size was normal. Wall thickness was  increased in a pattern of mild LVH. Systolic function was  moderately to severely reduced. The estimated ejection fraction  was in the range of 30% to 35%. Akinesis of the  mid-apicalanteroseptal, anterolateral, and apical myocardium.  Doppler parameters are consistent with abnormal left ventricular  relaxation (grade 1 diastolic dysfunction).  Assessment/Plan:  1. Acute anterolateral myocardial infarction, CAD native vessel:  Coronary angiogram 07/13/2017: Elevated LVEDP at 24 mmHg, systemic pressures 80/60 mmHg, patient in cardiogenic shock. Left main normal, LAD flush occluded. Ramus intermediate mid 70-80% % stenosis, mild disease in the circumflex. RCA occluded at the prior angioplasty site. Contralateral collaterals from the LAD.   Large LAD and diagonal, successful angioplasty and stenting following thrombectomy with implantation of 3.5 x 26 mm resolute Onyx DES deployed at 14 atmospheric pressure. Diagonal 1 ostial stenosis balloon angioplasty performed with 2.5 mm balloon at 10 atmospheric pressure. Stenosis reduced to 0%, mid flow improved from 0-3. 2. Cardiogenic shock needing Impella circulatory assist device day #2 3. Hypertension, presently hypotensive 4. Hyperlipidemia 5. Hyperglycemia 6.  Thrombocytopenia, will continue to follow closely.  Recommendation: Patient is hemodynamically stable with assist device, blood pressure still borderline and would like to continue support for additional 24 hours, will try to slowly weaned the patient. Right groin is stable, right  foot is warm, palpable pulses. EKG does not reveal any acute changes. I suspect his LV function will improve. Presently not on ACE inhibitor or beta blocker due to hypotension.    Adrian Prows, M.D. 07/14/2017, 3:31 PM [facility] Pager: [phone] Office: [phone] If no answer: [phone]

## 2017-07-14 NOTE — Progress Notes (Signed)
Spoke with Dr Einar Gip on the phone this AM. Md wants impella weaned down by 1 every hr until P4 is reached. Will cont to monitor and assess patient

## 2017-07-14 NOTE — Progress Notes (Signed)
Echocardiogram complete  

## 2017-07-15 LAB — CBC
HEMATOCRIT: 36.4 % — AB (ref 39.0–52.0)
HEMOGLOBIN: 12.4 g/dL — AB (ref 13.0–17.0)
MCH: 30.2 pg (ref 26.0–34.0)
MCHC: 34.1 g/dL (ref 30.0–36.0)
MCV: 88.6 fL (ref 78.0–100.0)
Platelets: 127 10*3/uL — ABNORMAL LOW (ref 150–400)
RBC: 4.11 MIL/uL — ABNORMAL LOW (ref 4.22–5.81)
RDW: 13.2 % (ref 11.5–15.5)
WBC: 11.3 10*3/uL — ABNORMAL HIGH (ref 4.0–10.5)

## 2017-07-15 LAB — POCT ACTIVATED CLOTTING TIME
ACTIVATED CLOTTING TIME: 158 s
ACTIVATED CLOTTING TIME: 164 s
ACTIVATED CLOTTING TIME: 169 s
ACTIVATED CLOTTING TIME: 175 s
Activated Clotting Time: 153 seconds
Activated Clotting Time: 153 seconds
Activated Clotting Time: 153 seconds
Activated Clotting Time: 164 seconds
Activated Clotting Time: 164 seconds
Activated Clotting Time: 169 seconds
Activated Clotting Time: 180 seconds

## 2017-07-15 LAB — BASIC METABOLIC PANEL
ANION GAP: 6 (ref 5–15)
Anion gap: 8 (ref 5–15)
BUN: 9 mg/dL (ref 6–20)
BUN: 12 mg/dL (ref 6–20)
CHLORIDE: 108 mmol/L (ref 101–111)
CHLORIDE: 106 mmol/L (ref 101–111)
CO2: 23 mmol/L (ref 22–32)
CO2: 23 mmol/L (ref 22–32)
CREATININE: 1.11 mg/dL (ref 0.61–1.24)
Calcium: 8.5 mg/dL — ABNORMAL LOW (ref 8.9–10.3)
Calcium: 8.6 mg/dL — ABNORMAL LOW (ref 8.9–10.3)
Creatinine, Ser: 1.04 mg/dL (ref 0.61–1.24)
GFR calc Af Amer: >60 mL/min (ref >60)
GFR calc non Af Amer: >60 mL/min (ref >60)
GFR calc non Af Amer: >60 mL/min (ref >60)
GLUCOSE: 129 mg/dL — AB (ref 65–99)
Glucose, Bld: 136 mg/dL — ABNORMAL HIGH (ref 65–99)
POTASSIUM: 3.9 mmol/L (ref 3.5–5.1)
Potassium: 3.8 mmol/L (ref 3.5–5.1)
SODIUM: 139 mmol/L (ref 135–145)
Sodium: 135 mmol/L (ref 135–145)

## 2017-07-15 LAB — TROPONIN I

## 2017-07-15 LAB — HEPARIN LEVEL (UNFRACTIONATED): Heparin Unfractionated: 0.73 IU/mL — ABNORMAL HIGH (ref 0.30–0.70)

## 2017-07-15 MED ORDER — ATROPINE SULFATE 1 MG/10ML IJ SOSY
PREFILLED_SYRINGE | INTRAMUSCULAR | Status: AC
  Filled 2017-07-15: qty 10

## 2017-07-15 NOTE — Progress Notes (Signed)
Impella support discontinued and catheter and sheath removed.  Pressure held x 30 minutes.  Vitals stable throughout sheath removal.  Good distal 2+ pulses in right DP.  Site looks good level 0 with no hematoma.  Slight bruising around site.  Dressed with 4x4 and tegaderm.  Instructions for bedrest given.  Bedrest to start at 1000.  RN will continue to monitor site.

## 2017-07-15 NOTE — Progress Notes (Signed)
Subjective:  Feels better this morning. No further chest pain. No dyspnea, has mild back pain.  Objective:  Vital Signs in the last 24 hours: Temp:  [98.4 F (36.9 C)-99 F (37.2 C)] 98.9 F (37.2 C) (07/22 0600) Pulse Rate:  [84-114] 91 (07/22 0950) Resp:  [10-28] 15 (07/22 0950) BP: (90-118)/(54-84) 98/62 (07/22 0950) SpO2:  [93 %-100 %] 93 % (07/22 0950) Weight:  [99.9 kg (220 lb 3.8 oz)] 99.9 kg (220 lb 3.8 oz) (07/22 0500)  Intake/Output from previous day: 07/21 0701 - 07/22 0700 In: 775.7 [I.V.:362.6] Out: 4610 [Urine:4610]  Physical Exam:   General appearance: alert, cooperative, appears stated age and no distress Eyes: negative findings: lids and lashes normal Neck: no adenopathy, no carotid bruit, no JVD, supple, symmetrical, trachea midline and thyroid not enlarged, symmetric, no tenderness/mass/nodules Neck: JVP - normal, carotids 2+= without bruits Resp: clear to auscultation bilaterally Chest wall: no tenderness Cardio: regular rate and rhythm, S1, S2 normal, no murmur, click, rub or gallop GI: soft, non-tender; bowel sounds normal; no masses,  no organomegaly Extremities: extremities normal, atraumatic, no cyanosis or edema and Right groin without hematoma. Bilateral lower  palpable pulses.  Lab Results: BMP  Recent Labs  07/13/17 0219 07/14/17 0357 07/15/17 0359  NA 137 139 139  K 3.8 3.9 3.9  CL 106 109 108  CO2 23 25 23   GLUCOSE 158* 144* 129*  BUN 15 13 9   CREATININE 1.42* 1.21 1.04  CALCIUM 8.8* 8.3* 8.5*  GFRNONAA 58* >60 >60  GFRAA >60 >60 >60    CBC  Recent Labs Lab 07/14/17 0357  WBC 10.7*  RBC 4.08*  HGB 12.4*  HCT 36.7*  PLT 133*  MCV 90.0  MCH 30.4  MCHC 33.8  RDW 13.4    HEMOGLOBIN A1C Lab Results  Component Value Date   HGBA1C 6.0 (H) 07/13/2017   MPG 126 07/13/2017    Cardiac Panel (last 3 results)  Recent Labs  07/13/17 1442 07/13/17 2035 07/15/17 0359  TROPONINI >65.00* >65.00* >65.00*    BNP (last 3  results) No results for input(s): PROBNP in the last 8760 hours.  TSH  Recent Labs  07/13/17 0740  TSH 1.360   Imaging: Imaging results have been reviewed  Cardiac Studies:  EKG 07/14/2017: Normal sinus rhythm with rate of 90 bpm, leftward abnormality, poor progression, cannot exclude anterior infarct old. No evidence of ischemia.  ECHO: 07/14/17:  Left ventricle: The cavity size was normal. Wall thickness was  increased in a pattern of mild LVH. Systolic function was  moderately to severely reduced. The estimated ejection fraction  was in the range of 30% to 35%. Akinesis of the  mid-apicalanteroseptal, anterolateral, and apical myocardium.  Doppler parameters are consistent with abnormal left ventricular  relaxation (grade 1 diastolic dysfunction).  Assessment/Plan:  1. Acute anterolateral myocardial infarction, CAD native vessel:  Coronary angiogram 07/13/2017: Elevated LVEDP at 24 mmHg, systemic pressures 80/60 mmHg, patient in cardiogenic shock. Left main normal, LAD flush occluded. Ramus intermediate mid 70-80% % stenosis, mild disease in the circumflex. RCA occluded at the prior angioplasty site. Contralateral collaterals from the LAD.   Large LAD and diagonal, successful angioplasty and stenting following thrombectomy with implantation of 3.5 x 26 mm resolute Onyx DES deployed at 14 atmospheric pressure. Diagonal 1 ostial stenosis balloon angioplasty performed with 2.5 mm balloon at 10 atmospheric pressure. Stenosis reduced to 0%, mid flow improved from 0-3. 2. Cardiogenic shock needing Impella circulatory assist device day #2 3. Hypertension, presently hypotensive  4. Hyperlipidemia 5. Hyperglycemia 6. Thrombocytopenia, stable, will continue to follow closely.  Recommendation: Patient is hemodynamically stable, will discontinue Impella support. Right groin no hematoma. labs are stable. EKG does not reveal any acute changes. I suspect his LV function will improve. Presently not on  ACE inhibitor or beta blocker due to hypotension. Will start Coreg tomorrow.  Addendum: Impella was discontinued and pulled under sterile precautions. Manual pressure held. Bedrest for 8 hours followed by head up today and up in chair tomorrow.  Adrian Prows, M.D. 07/15/2017, 9:53 AM [facility] Pager: [phone] Office: [phone] If no answer: [phone]

## 2017-07-15 NOTE — Progress Notes (Signed)
ANTICOAGULATION CONSULT NOTE - Follow Up Consult  Pharmacy Consult for Heparin  Indication: Impella pump in place, s/p cath  No Known Allergies   Vital Signs: Temp: 98.9 F (37.2 C) (07/22 0600) Temp Source: Oral (07/22 0600) BP: 98/62 (07/22 0950) Pulse Rate: 91 (07/22 0950)  Labs:  Recent Labs  07/13/17 0219  07/13/17 1442 07/13/17 2035 07/14/17 0357 07/14/17 0358 07/15/17 0359  HGB 15.5  --   --   --  12.4*  --   --   HCT 44.2  --   --   --  36.7*  --   --   PLT 193  --   --   --  133*  --   --   HEPARINUNFRC  --   --   --  0.68  --  0.93* 0.73*  CREATININE 1.42*  --   --   --  1.21  --  1.04  TROPONINI  --   < > >65.00* >65.00*  --   --  >65.00*  < > = values in this interval not displayed. Medical History: Past Medical History:  Diagnosis Date  . Graves disease   . Hypertension   . MI (myocardial infarction) ([place])   . Thyroid disease    thyroid irradiated per pt    Assessment: 46 y/o M Hx CAD - DES in RCA about 66yr ago - CODE STEMI DES to LAD and D1  now s/p cath with Impella in place. Most recent ACT above goal range at 191.  Heparin level drawn this morning = 0.73.  Slightly above normal therapeutic range, but ACTs at goal.  Per RN has slight hematoma at Impella site.  Patient receiving heparin at total rate of 1730 units/hr (Impella purge + systemic heparin).  Goal of Therapy:  ACT 160-180  Monitor platelets by anticoagulation protocol: Yes   Plan:  -Heparin turned off in anticipation of Impella removal - removed ~ 0930 AM -Will d/c heparin orders and labs.   Uvaldo Rising, BCPS  Clinical Pharmacist Pager [phone]  07/15/2017 10:14 AM

## 2017-07-15 NOTE — Progress Notes (Signed)
Per Dr. Einar Gip, if morning labs are satisfactory and UOP remains good, heparin can be turned of in the morning.

## 2017-07-15 NOTE — Progress Notes (Signed)
Bedrest for 8 hours before patient is able to sit up, Dr. Einar Gip would like patient to stay in bed today and up to chair 07/16/17.  Rowe Pavy, RN

## 2017-07-16 LAB — BASIC METABOLIC PANEL
Anion gap: 6 (ref 5–15)
BUN: 11 mg/dL (ref 6–20)
CHLORIDE: 107 mmol/L (ref 101–111)
CO2: 24 mmol/L (ref 22–32)
CREATININE: 1.17 mg/dL (ref 0.61–1.24)
Calcium: 8.6 mg/dL — ABNORMAL LOW (ref 8.9–10.3)
GFR calc Af Amer: >60 mL/min (ref >60)
GLUCOSE: 128 mg/dL — AB (ref 65–99)
Potassium: 4 mmol/L (ref 3.5–5.1)
SODIUM: 137 mmol/L (ref 135–145)

## 2017-07-16 LAB — CBC
HCT: 34.7 % — ABNORMAL LOW (ref 39.0–52.0)
Hemoglobin: 12.5 g/dL — ABNORMAL LOW (ref 13.0–17.0)
MCH: 32 pg (ref 26.0–34.0)
MCHC: 36 g/dL (ref 30.0–36.0)
MCV: 88.7 fL (ref 78.0–100.0)
PLATELETS: 117 10*3/uL — AB (ref 150–400)
RBC: 3.91 MIL/uL — ABNORMAL LOW (ref 4.22–5.81)
RDW: 13.3 % (ref 11.5–15.5)
WBC: 10 10*3/uL (ref 4.0–10.5)

## 2017-07-16 LAB — HEMOGLOBIN FREE, PLASMA: HGB PLASMA: 82.5 mg/dL — AB (ref 0.0–4.9)

## 2017-07-16 MED FILL — Tirofiban HCl in NaCl 0.9% IV Soln 5 MG/100ML (Base Equiv): INTRAVENOUS | Qty: 100 | Status: AC

## 2017-07-16 NOTE — Progress Notes (Signed)
CARDIAC REHAB PHASE I   PRE:  Rate/Rhythm: 116 ST    BP: sitting 118/74, recheck after standing 117/76    SaO2:   MODE:  Ambulation: 500 ft   POST:  Rate/Rhythm: 128 ST    BP: 119/87  Pt lightheaded with standing. Rechecked BP but stable. HR elevated up to 128 ST. Pt just feels tired in general and slight tightness in throat. Ed completed with pt and parents. He has been under considerable stress (recent breakup, trying to move out). He is determined to be done with smoking, gave him resources. Reinforced Brilinta and other meds. He needs to see CM. Will refer to [facility], he is very interested. Will f/u tomorrow. Encouraged more walking. [place], ACSM 07/16/2017 3:20 PM

## 2017-07-16 NOTE — Progress Notes (Signed)
Subjective:  Patient states that he feels better. No chest pain or dyspnea. Resting in bed comfortably, but did tolerate sitting in the chair.   Objective:  Vital Signs in the last 24 hours: Temp:  [98.6 F (37 C)-98.9 F (37.2 C)] 98.7 F (37.1 C) (07/23 0700) Pulse Rate:  [80-114] 107 (07/23 0700) Resp:  [10-28] 21 (07/23 0700) BP: (85-133)/(52-81) 108/75 (07/23 0700) SpO2:  [93 %-100 %] 96 % (07/23 0700) Weight:  [99.4 kg (219 lb 2.2 oz)] 99.4 kg (219 lb 2.2 oz) (07/23 0400)  Intake/Output from previous day: 07/22 0701 - 07/23 0700 In: 985.9 [P.O.:960; I.V.:11.3] Out: 2845 [Urine:2845]  Physical Exam:  General appearance: alert, cooperative, appears stated age and no distress Eyes: negative findings: lids and lashes normal Neck: no adenopathy, no carotid bruit, no JVD, supple, symmetrical, trachea midline and thyroid not enlarged, symmetric, no tenderness/mass/nodules Neck: JVP - normal, carotids 2+= without bruits Resp: clear to auscultation bilaterally Chest wall: no tenderness Cardio: regular rate and rhythm, S1, S2 normal, no murmur, click, rub or gallop GI: soft, non-tender; bowel sounds normal; no masses,  no organomegaly Extremities: extremities normal, atraumatic, no cyanosis or edema.  Right groin without hematoma, ecchymosis present, mild tenderness. Bilateral lower extremities with palpable pulses.   Lab Results: BMP  Recent Labs  07/15/17 0359 07/15/17 2315 07/16/17 0355  NA 139 135 137  K 3.9 3.8 4.0  CL 108 106 107  CO2 23 23 24   GLUCOSE 129* 136* 128*  BUN 9 12 11   CREATININE 1.04 1.11 1.17  CALCIUM 8.5* 8.6* 8.6*  GFRNONAA >60 >60 >60  GFRAA >60 >60 >60    CBC  Recent Labs Lab 07/16/17 0355  WBC 10.0  RBC 3.91*  HGB 12.5*  HCT 34.7*  PLT 117*  MCV 88.7  MCH 32.0  MCHC 36.0  RDW 13.3    HEMOGLOBIN A1C Lab Results  Component Value Date   HGBA1C 6.0 (H) 07/13/2017   MPG 126 07/13/2017    Cardiac Panel (last 3  results)  Recent Labs  07/13/17 1442 07/13/17 2035 07/15/17 0359  TROPONINI >65.00* >65.00* >65.00*    BNP (last 3 results) No results for input(s): PROBNP in the last 8760 hours.  TSH  Recent Labs  07/13/17 0740  TSH 1.360    CHOLESTEROL  Recent Labs  07/14/17 0356  CHOL 130    Hepatic Function Panel No results for input(s): PROT, ALBUMIN, AST, ALT, ALKPHOS, BILITOT, BILIDIR, IBILI in the last 8760 hours.  Imaging: Imagining results have been reviewed.   Cardiac Studies:  EKG 07/14/2017: Normal sinus rhythm with rate of 90 bpm, leftward abnormality, poor progression, cannot exclude anterior infarct old. No evidence of ischemia.  ECHO: 07/14/17:  Left ventricle: The cavity size was normal. Wall thickness wasincreased in a pattern of mild LVH. Systolic function wasmoderately to severely reduced. The estimated ejection fractionwas in the range of 30% to 35%. Akinesis of themid-apicalanteroseptal, anterolateral, and apical myocardium.Doppler parameters are consistent with abnormal left ventricularrelaxation (grade 1 diastolic dysfunction).   Assessment/Plan:  1. Acute anterolateral myocardial infarction, CAD native vessel:  Coronary angiogram 07/13/2017: Elevated LVEDP at 24 mmHg, systemic pressures 80/60 mmHg, patient in cardiogenic shock. Left main normal, LAD flush occluded. Ramus intermediate mid 70-80% % stenosis, mild disease in the circumflex. RCA occluded at the prior angioplasty site. Contralateral collaterals from the LAD.   Large LAD and diagonal, successful angioplasty and stenting following thrombectomy with implantation of 3.5 x 26 mm resolute Onyx DES deployed at 14  atmospheric pressure. Diagonal 1 ostial stenosis balloon angioplasty performed with 2.5 mm balloon at 10 atmospheric pressure. Stenosis reduced to 0%, mid flow improved from 0-3. 2. Cardiogenic shock needing Impella circulatory assist device discontinued on 7/22 3. Hypertension, was  hypotensive but improving 4. Hyperlipidemia 5. Hyperglycemia 6. Thrombocytopenia, improving, will continue to follow closely.  Recommendation: Patient is presently doing well, resting in bed without acute distress and has been up to chair without difficulty. Impella discontinued 7/22. Patient remains hemodynamically stable. Lung sounds clear. Right groin no hematoma. Labs are stable.  I suspect his LV function will improve. Presently not on ACE inhibitor or beta blocker due to hypotension. Hypotension is improving, will start Coreg today.       Miquel Dunn, FNP-C. 07/16/2017, 8:07 AM [facility] Pager: [phone] Office: [phone] If no answer: [phone]

## 2017-07-16 NOTE — Progress Notes (Signed)
6. S/W SABRENE @ [facility] # [phone]    1.BRILINTA 90 MG BID    COVER- YES  CO-PAY- $60.00  TIER- 3 DRUG  PRIOR APPROVAL- NO   PHARMACY : [facility]  AND [facility]

## 2017-07-16 NOTE — Care Management Note (Signed)
Case Management Note  Patient Details  Name: Zafir Schauer MRN: [id number] Date of Birth: 11-10-1971  Subjective/Objective:  From home alone, pta indep,  S/p successful angioplasty and stenting following thrombectomy,will be on brilinta, NCM gave patient $5 co pay card, [facility] will [facility] to [facility] [place] to get [facility] and they so have it in stock.                    Action/Plan: NCM  Will follow for dc needs.  Expected Discharge Date:                  Expected Discharge Plan:  Home/Self Care  In-House Referral:     Discharge planning Services  CM Consult  Post Acute Care Choice:    Choice offered to:     DME Arranged:    DME Agency:     HH Arranged:    [place] [place] Agency:     Status of Service:  Completed, signed off  If discussed at [employer] of Stay Meetings, dates discussed:    Additional Comments:  Zenon Mayo, RN 07/16/2017, 7:58 PM

## 2017-07-17 LAB — BASIC METABOLIC PANEL
ANION GAP: 9 (ref 5–15)
BUN: 15 mg/dL (ref 6–20)
CALCIUM: 9.2 mg/dL (ref 8.9–10.3)
CO2: 23 mmol/L (ref 22–32)
Chloride: 105 mmol/L (ref 101–111)
Creatinine, Ser: 1.22 mg/dL (ref 0.61–1.24)
GFR calc Af Amer: >60 mL/min (ref >60)
GLUCOSE: 131 mg/dL — AB (ref 65–99)
Potassium: 4.1 mmol/L (ref 3.5–5.1)
SODIUM: 137 mmol/L (ref 135–145)

## 2017-07-17 MED ORDER — CARVEDILOL 3.125 MG PO TABS
3.1250 mg | ORAL_TABLET | Freq: Three times a day (TID) | ORAL | 0 refills | Status: DC
Start: 2017-07-17 — End: 2017-08-21

## 2017-07-17 MED ORDER — TICAGRELOR 90 MG PO TABS: 90.0000 mg | ORAL_TABLET | Freq: Two times a day (BID) | ORAL | 0 refills | Status: DC

## 2017-07-17 MED ORDER — ROSUVASTATIN CALCIUM 20 MG PO TABS: 20.0000 mg | ORAL_TABLET | Freq: Every day | ORAL | 0 refills | Status: DC

## 2017-07-17 MED ORDER — PANTOPRAZOLE SODIUM 40 MG PO TBEC: 40.0000 mg | DELAYED_RELEASE_TABLET | Freq: Every day | ORAL | 3 refills | Status: DC

## 2017-07-17 MED ORDER — NITROGLYCERIN 0.4 MG SL SUBL: 0.4000 mg | SUBLINGUAL_TABLET | SUBLINGUAL | 3 refills | Status: DC | PRN

## 2017-07-17 NOTE — Discharge Summary (Signed)
Physician Discharge Summary  Patient ID: Bobby [name] MRN: [id number] DOB/AGE: [birth year] 46 y.o.  Admit date: 07/13/2017 Discharge date: 07/17/2017  Primary Discharge Diagnosis 1. Acute anterolateral myocardial infarction, CAD native vessel with cardiogenic shock   Secondary Discharge Diagnosis 2. Cardiogenic shock needing Impella circulatory assist device discontinued on 7/22 3. Hypertension, was hypotensive but improving 4. Hyperlipidemia 5. Hyperglycemia  Significant Diagnostic Studies:  Coronary angiogram 07/13/2017: Elevated LVEDP at 24 mmHg, systemic pressures 80/60 mmHg, patient in cardiogenic shock. Left main normal, LAD flush occluded. Ramus intermediate mid 70-80% % stenosis, mild disease in the circumflex. RCA occluded at the prior angioplasty site. Contralateral collaterals from the LAD.  Large LAD and diagonal, successful angioplasty and stenting following thrombectomy with implantation of 3.5 x 26 mm resolute Onyx DES deployed at 14 atmospheric pressure. Diagonal 1 ostial stenosis balloon angioplasty performed with 2.5 mm balloon at 10 atmospheric pressure. Stenosis reduced to 0%, mid flow improved from 0-3.  Echocardiogram 07/14/2017: - Left ventricle: The cavity size was normal. Wall thickness was    increased in a pattern of mild LVH. Systolic function was  moderately to severely reduced. The estimated ejection fraction  was in the range of 30% to 35%. Akinesis of the  mid-apicalanteroseptal, anterolateral, and apical myocardium.  Doppler parameters are consistent with abnormal left ventricular  relaxation (grade 1 diastolic dysfunction).  EKG 07/14/2017: Normal sinus rhythm at rate of 90 bpm, normal axis, septal infarct old.  Low-voltage complexes.  Hospital Course: Patient was admitted on 07/13/2017 with severe ongoing chest discomfort, EKG revealing hyperacute T waves and ST elevation in anterolateral leads without reciprocal ST depressions, with clinical diagnosis of  anterolateral MI, patient being hypotensive, was taken emergently to the cardiac catheterization lab and underwent successful revascularization to the LAD.  Because of persistent hypotension, ventricular assist device, Impella was inserted and left in place for approximately 36-48 hours and was successfully removed once patient's hemodynamics improved.  Patient also received inotropic support immediately following PCI.  After the ICU stay, he is transferred to telemetry 3rd day where he was able to ambulate.  On walking he did experience mild throat discomfort which was felt to be probably angina pectoris however he did not have any further chest pain.  He has residual ramus intermediate 70-80% stenosis which probably needs relook once patient is stable probably in 3-4 weeks.  Due to low blood pressure, patient was not started on ace inhibitors however very low dose of carvedilol was started.  He remained in sinus tachycardia probably related to low blood pressure and significant LV systolic dysfunction.  He had no arrhythmias on telemetry hence LifeVest was not considered.  I expect a good amount of anterolateral wall function to improve.  He will be seen in office back within the next one week to 10 days.   Recommendations on discharge: Patient advised not to do any heavy exertional activities, not to return to work for at least 4 weeks until cleared by me.  He was started on aspirin along with Brilinta and low-dose carvedilol.  Once blood pressure improves, will consider addition of ace inhibitors.  He will need cardiac rehabilitation.  Discharge Exam: Blood pressure 103/74, pulse 99, temperature 99.2 F (37.3 C), temperature source Oral, resp. rate 16, height 6\' 1"  (1.854 m), weight 97.5 kg (215 lb), SpO2 99 %.   General appearance: alert, cooperative, appears stated age and no distress Resp: clear to auscultation bilaterally Cardio: regular rate and rhythm, S1, S2 normal, no murmur, click, rub or  gallop GI: soft, non-tender; bowel sounds normal; no masses,  no organomegaly Extremities: extremities normal, atraumatic, no cyanosis or edema Pulses: 2+ and symmetric Neurologic: Grossly normal Labs:   Lab Results  Component Value Date   WBC 10.0 07/16/2017   HGB 12.5 (L) 07/16/2017   HCT 34.7 (L) 07/16/2017   MCV 88.7 07/16/2017   PLT 117 (L) 07/16/2017    Recent Labs Lab 07/17/17 0424  NA 137  K 4.1  CL 105  CO2 23  BUN 15  CREATININE 1.22  CALCIUM 9.2  GLUCOSE 131*    Lipid Panel     Component Value Date/Time   CHOL 130 07/14/2017 0356   TRIG 129 07/14/2017 0356   HDL 37 (L) 07/14/2017 0356   CHOLHDL 3.5 07/14/2017 0356   VLDL 26 07/14/2017 0356   LDLCALC 67 07/14/2017 0356    BNP (last 3 results) No results for input(s): BNP in the last 8760 hours.  HEMOGLOBIN A1C Lab Results  Component Value Date   HGBA1C 6.0 (H) 07/13/2017   MPG 126 07/13/2017    Cardiac Panel (last 3 results)  Recent Labs  07/13/17 1442 07/13/17 2035 07/15/17 0359  TROPONINI >65.00* >65.00* >65.00*    Lab Results  Component Value Date   CKTOTAL 51 03/16/2012   CKMB 1.9 03/16/2012   TROPONINI >65.00 (HH) 07/15/2017     TSH  Recent Labs  07/13/17 0740  TSH 1.360   Radiology: Dg Chest Port 1 View  Result Date: 07/13/2017 CLINICAL DATA:  Chest pain EXAM: PORTABLE CHEST 1 VIEW COMPARISON:  07/13/2017 FINDINGS: Cardiac shadow is mildly enlarged but accentuated by the portable technique. An Impella catheter is now seen in satisfactory position. The lungs are clear bilaterally. No bony abnormality is noted. IMPRESSION: Ventricular assist device in satisfactory position. No acute abnormality is noted. Electronically Signed   By: Inez Catalina M.D.   On: 07/13/2017 07:13   Dg Chest Portable 1 View  Result Date: 07/13/2017 CLINICAL DATA:  Chest pain and diaphoretic. EXAM: PORTABLE CHEST 1 VIEW COMPARISON:  CXR 03/15/2012 FINDINGS: The heart size and mediastinal contours are  within normal limits. Stable mild hilar prominence believed to be vascular in etiology since 2013. Both lungs are clear. The visualized skeletal structures are unremarkable. IMPRESSION: No active disease. Electronically Signed   By: Ashley Royalty M.D.   On: 07/13/2017 02:39      FOLLOW UP PLANS AND APPOINTMENTS Discharge Instructions    Amb Referral to Cardiac Rehabilitation    Complete by:  As directed    Diagnosis:   STEMI PTCA Coronary Stents       Allergies as of 07/17/2017   No Known Allergies     Medication List    STOP taking these medications   atenolol 25 MG tablet Commonly known as:  TENORMIN   lisinopril 10 MG tablet Commonly known as:  PRINIVIL,ZESTRIL   NEXIUM 40 MG capsule Generic drug:  esomeprazole Replaced by:  pantoprazole 40 MG tablet     TAKE these medications   aspirin 81 MG tablet Take 81 mg by mouth daily.   carvedilol 3.125 MG tablet Commonly known as:  COREG Take 1 tablet (3.125 mg total) by mouth 3 (three) times daily.   cholecalciferol 1000 units tablet Commonly known as:  VITAMIN D Take 2,000 Units by mouth daily.   levothyroxine 137 MCG tablet Commonly known as:  SYNTHROID, LEVOTHROID Take 137 mcg by mouth daily before breakfast.   NITROSTAT 0.4 MG SL tablet Generic drug:  nitroGLYCERIN Place 0.4 mg under the tongue every 5 (five) minutes as needed. What changed:  Another medication with the same name was added. Make sure you understand how and when to take each.   nitroGLYCERIN 0.4 MG SL tablet Commonly known as:  NITROSTAT Place 1 tablet (0.4 mg total) under the tongue every 5 (five) minutes as needed for chest pain. What changed:  You were already taking a medication with the same name, and this prescription was added. Make sure you understand how and when to take each.   pantoprazole 40 MG tablet Commonly known as:  PROTONIX Take 1 tablet (40 mg total) by mouth daily before lunch. Replaces:  NEXIUM 40 MG capsule    rosuvastatin 20 MG tablet Commonly known as:  CRESTOR Take 1 tablet (20 mg total) by mouth daily. What changed:  medication strength  how much to take   ticagrelor 90 MG Tabs tablet Commonly known as:  BRILINTA Take 1 tablet (90 mg total) by mouth 2 (two) times daily.      Follow-up Information    Adrian Prows, MD Follow up on 07/23/2017.   Specialty:  Cardiology Why:  3:15 pm appointment. Bring all medications Contact information: [address] [place] [id number] [phone]            Adrian Prows, MD 07/17/2017, 9:56 AM  Pager: [phone] Office: [phone] If no answer: [phone]

## 2017-07-17 NOTE — Plan of Care (Signed)
Problem: Pain Managment: Goal: General experience of comfort will improve Outcome: Progressing Patient has minimal complaints of pain. 1-2 / 10 in R groin area, relieved with Tylenol.

## 2017-07-17 NOTE — Progress Notes (Signed)
CARDIAC REHAB PHASE I   PRE:  Rate/Rhythm: 111 ST  BP:  Supine:   Sitting: 118/78  Standing:    SaO2:   MODE:  Ambulation: 700 ft   POST:  Rate/Rhythm: 128 ST  BP:  Supine:   Sitting: 111/76  Standing:    SaO2: 100%RA 1002-1022 Pt walked 700 ft with steady gait. His heart rate to 128 without palpitations. He did c/o jaw tightness 2 on scale that immediately went away with sitting. Notified Dr Einar Gip of jaw discomfort and that pt needs a new NTG prescription. Discussed with pt that A1c of 6 and the need to watch carbs. He stated that he had been having sugars a little high for 3 to 4 years.  No questions re ed done yesterday.   Graylon Good, RN BSN  07/17/2017 10:20 AM

## 2017-07-17 NOTE — Discharge Instructions (Signed)
Cath Site Care Refer to this sheet in the next few weeks. These instructions provide you with information about caring for yourself after your procedure. Your health care provider may also give you more specific instructions. Your treatment has been planned according to current medical practices, but problems sometimes occur. Call your health care provider if you have any problems or questions after your procedure. What can I expect after the procedure? After your procedure, it is typical to have the following:  Bruising at the site that usually fades within 1-2 weeks.  Blood collecting in the tissue (hematoma) that may be painful to the touch. It should usually decrease in size and tenderness within 1-2 weeks.  Follow these instructions at home:  Take medicines only as directed by your health care provider.  You may shower 24-48 hours after the procedure or as directed by your health care provider. Remove the bandage (dressing) and gently wash the site with plain soap and water. Pat the area dry with a clean towel. Do not rub the site, because this may cause bleeding.  Do not take baths, swim, or use a hot tub until your health care provider approves.  Check your insertion site every day for redness, swelling, or drainage.  Do not apply powder or lotion to the site.  Do not push or pull heavy objects for 24 hours or as directed by your health care provider.  Do not lift over 10 lb (4.5 kg) for 5 days after your procedure or as directed by your health care provider.  Ask your health care provider when it is okay to: ? Return to work or school. ? Resume usual physical activities or sports. ? Resume sexual activity.  Do not drive home if you are discharged the same day as the procedure. Have someone else drive you.  You may drive 24 hours after the procedure unless otherwise instructed by your health care provider.  Do not operate machinery or power tools for 24 hours after the  procedure.  If your procedure was done as an outpatient procedure, which means that you went home the same day as your procedure, a responsible adult should be with you for the first 24 hours after you arrive home.  Keep all follow-up visits as directed by your health care provider. This is important. Contact a health care provider if:  You have a fever.  You have chills.  You have increased bleeding from the site. Hold pressure on the site. Get help right away if:  You have unusual pain at the radial site.  You have redness, warmth, or swelling at the radial site.  You have drainage (other than a small amount of blood on the dressing) from the site.  The site is bleeding, and the bleeding does not stop after 30 minutes of holding steady pressure on the site.  Your arm/leg becomes pale, cool, tingly, or numb. This information is not intended to replace advice given to you by your health care provider. Make sure you discuss any questions you have with your health care provider. Document Released: 01/13/2011 Document Revised: 05/18/2016 Document Reviewed: 06/29/2014 Elsevier Interactive Patient Education  2018 [employer].

## 2017-07-19 ENCOUNTER — Telehealth (HOSPITAL_COMMUNITY): Payer: Self-pay

## 2017-07-19 NOTE — Telephone Encounter (Signed)
Patient insurance is active and benefits verified. Patient insurance is [employer] - $30.00 co-payment, deductible $5000/$23.26 has been met, out of pocket $6300/$565.25 has been met, no co-insurance, no pre-authorization and no limit on visit. Passport/reference [phone].

## 2017-08-21 ENCOUNTER — Encounter (HOSPITAL_COMMUNITY)
Admission: RE | Disposition: A | Discharge status: Home / Self Care | Payer: Self-pay | Source: Ambulatory Visit | Attending: Cardiology

## 2017-08-21 ENCOUNTER — Ambulatory Visit (HOSPITAL_COMMUNITY)
Admission: RE | Admit: 2017-08-21 | Discharge: 2017-08-21 | Disposition: A | Discharge status: Home / Self Care | Payer: 59 | Source: Ambulatory Visit | Attending: Cardiology | Admitting: Cardiology

## 2017-08-21 DIAGNOSIS — I2510 Atherosclerotic heart disease of native coronary artery without angina pectoris: Secondary | ICD-10-CM | POA: Diagnosis present

## 2017-08-21 DIAGNOSIS — Z79899 Other long term (current) drug therapy: Secondary | ICD-10-CM | POA: Diagnosis not present

## 2017-08-21 DIAGNOSIS — Z955 Presence of coronary angioplasty implant and graft: Secondary | ICD-10-CM | POA: Insufficient documentation

## 2017-08-21 DIAGNOSIS — I10 Essential (primary) hypertension: Secondary | ICD-10-CM | POA: Insufficient documentation

## 2017-08-21 DIAGNOSIS — I252 Old myocardial infarction: Secondary | ICD-10-CM | POA: Insufficient documentation

## 2017-08-21 DIAGNOSIS — I2582 Chronic total occlusion of coronary artery: Secondary | ICD-10-CM | POA: Diagnosis not present

## 2017-08-21 DIAGNOSIS — Z8249 Family history of ischemic heart disease and other diseases of the circulatory system: Secondary | ICD-10-CM | POA: Diagnosis not present

## 2017-08-21 DIAGNOSIS — Z87891 Personal history of nicotine dependence: Secondary | ICD-10-CM | POA: Insufficient documentation

## 2017-08-21 DIAGNOSIS — E782 Mixed hyperlipidemia: Secondary | ICD-10-CM | POA: Diagnosis not present

## 2017-08-21 DIAGNOSIS — Z7982 Long term (current) use of aspirin: Secondary | ICD-10-CM | POA: Diagnosis not present

## 2017-08-21 DIAGNOSIS — I255 Ischemic cardiomyopathy: Secondary | ICD-10-CM | POA: Insufficient documentation

## 2017-08-21 DIAGNOSIS — I25119 Atherosclerotic heart disease of native coronary artery with unspecified angina pectoris: Secondary | ICD-10-CM | POA: Diagnosis present

## 2017-08-21 HISTORY — PX: LEFT HEART CATH AND CORONARY ANGIOGRAPHY: CATH118249

## 2017-08-21 SURGERY — LEFT HEART CATH AND CORONARY ANGIOGRAPHY
Anesthesia: LOCAL

## 2017-08-21 MED ORDER — IOPAMIDOL (ISOVUE-370) INJECTION 76%
INTRAVENOUS | Status: DC | PRN
  Administered 2017-08-21: 30 mL via INTRA_ARTERIAL

## 2017-08-21 MED ORDER — CARVEDILOL 3.125 MG PO TABS: 3.1250 mg | ORAL_TABLET | Freq: Two times a day (BID) | ORAL | 0 refills | Status: DC

## 2017-08-21 MED ORDER — ATROPINE SULFATE 1 MG/10ML IJ SOSY
PREFILLED_SYRINGE | INTRAMUSCULAR | Status: AC
  Filled 2017-08-21: qty 10

## 2017-08-21 MED ORDER — ASPIRIN 81 MG PO CHEW: 81.0000 mg | CHEWABLE_TABLET | ORAL | Status: DC

## 2017-08-21 MED ORDER — SODIUM CHLORIDE 0.9 % IV SOLN: 250.0000 mL | INTRAVENOUS | Status: DC | PRN

## 2017-08-21 MED ORDER — FENTANYL CITRATE (PF) 100 MCG/2ML IJ SOLN
INTRAMUSCULAR | Status: AC
  Filled 2017-08-21: qty 2

## 2017-08-21 MED ORDER — NITROGLYCERIN 1 MG/10 ML FOR IR/CATH LAB
INTRA_ARTERIAL | Status: AC
  Filled 2017-08-21: qty 10

## 2017-08-21 MED ORDER — VERAPAMIL HCL 2.5 MG/ML IV SOLN
INTRAVENOUS | Status: AC
  Filled 2017-08-21: qty 2

## 2017-08-21 MED ORDER — HEPARIN (PORCINE) IN NACL 2-0.9 UNIT/ML-% IJ SOLN
INTRAMUSCULAR | Status: AC
  Filled 2017-08-21: qty 1000

## 2017-08-21 MED ORDER — IOPAMIDOL (ISOVUE-370) INJECTION 76%
INTRAVENOUS | Status: AC
  Filled 2017-08-21: qty 125

## 2017-08-21 MED ORDER — ONDANSETRON HCL 4 MG/2ML IJ SOLN: 4.0000 mg | Freq: Four times a day (QID) | INTRAMUSCULAR | Status: DC | PRN

## 2017-08-21 MED ORDER — LIDOCAINE HCL 2 % IJ SOLN
INTRAMUSCULAR | Status: AC
  Filled 2017-08-21: qty 10

## 2017-08-21 MED ORDER — SODIUM CHLORIDE 0.9 % WEIGHT BASED INFUSION
3.0000 mL/kg/h | INTRAVENOUS | Status: AC
  Administered 2017-08-21: 3 mL/kg/h via INTRAVENOUS

## 2017-08-21 MED ORDER — DOPAMINE-DEXTROSE 3.2-5 MG/ML-% IV SOLN
INTRAVENOUS | Status: DC | PRN
  Administered 2017-08-21: 5 ug/kg/min via INTRAVENOUS

## 2017-08-21 MED ORDER — HEPARIN (PORCINE) IN NACL 2-0.9 UNIT/ML-% IJ SOLN
INTRAMUSCULAR | Status: AC | PRN
  Administered 2017-08-21: 1000 mL

## 2017-08-21 MED ORDER — HEPARIN SODIUM (PORCINE) 1000 UNIT/ML IJ SOLN
INTRAMUSCULAR | Status: AC
  Filled 2017-08-21: qty 1

## 2017-08-21 MED ORDER — SODIUM CHLORIDE 0.9% FLUSH: 3.0000 mL | Freq: Two times a day (BID) | INTRAVENOUS | Status: DC

## 2017-08-21 MED ORDER — HEPARIN SODIUM (PORCINE) 1000 UNIT/ML IJ SOLN
INTRAMUSCULAR | Status: DC | PRN
  Administered 2017-08-21: 5000 [IU] via INTRAVENOUS

## 2017-08-21 MED ORDER — ATROPINE SULFATE 1 MG/10ML IJ SOSY
PREFILLED_SYRINGE | INTRAMUSCULAR | Status: DC | PRN
  Administered 2017-08-21: 0.5 mg via INTRAVENOUS

## 2017-08-21 MED ORDER — LIDOCAINE HCL (PF) 1 % IJ SOLN
INTRAMUSCULAR | Status: DC | PRN
  Administered 2017-08-21: 3 mL via INTRA_ARTERIAL

## 2017-08-21 MED ORDER — LIDOCAINE HCL (PF) 1 % IJ SOLN
INTRAMUSCULAR | Status: DC | PRN
  Administered 2017-08-21: 2 mL via INTRADERMAL

## 2017-08-21 MED ORDER — ACETAMINOPHEN 325 MG PO TABS: 650.0000 mg | ORAL_TABLET | ORAL | Status: DC | PRN

## 2017-08-21 MED ORDER — SODIUM CHLORIDE 0.9 % WEIGHT BASED INFUSION: 1.0000 mL/kg/h | INTRAVENOUS | Status: DC

## 2017-08-21 MED ORDER — MIDAZOLAM HCL 2 MG/2ML IJ SOLN
INTRAMUSCULAR | Status: AC
  Filled 2017-08-21: qty 2

## 2017-08-21 MED ORDER — SODIUM CHLORIDE 0.9% FLUSH: 3.0000 mL | INTRAVENOUS | Status: DC | PRN

## 2017-08-21 MED ORDER — FENTANYL CITRATE (PF) 100 MCG/2ML IJ SOLN
INTRAMUSCULAR | Status: DC | PRN
  Administered 2017-08-21: 25 ug via INTRAVENOUS

## 2017-08-21 MED ORDER — SODIUM CHLORIDE 0.9 % IV SOLN
250.0000 mL | INTRAVENOUS | Status: DC | PRN
  Administered 2017-08-21 (×2): 100 mL via INTRAVENOUS

## 2017-08-21 MED ORDER — MIDAZOLAM HCL 2 MG/2ML IJ SOLN
INTRAMUSCULAR | Status: DC | PRN
  Administered 2017-08-21: 1 mg via INTRAVENOUS

## 2017-08-21 SURGICAL SUPPLY — 11 items
CATH EXPO 5F FL3.5 (CATHETERS) ×4 IMPLANT
CATH INFINITI 5FR ANG PIGTAIL (CATHETERS) ×2 IMPLANT
DEVICE RAD COMP TR BAND LRG (VASCULAR PRODUCTS) ×2 IMPLANT
GLIDESHEATH SLEND A-KIT 6F 20G (SHEATH) ×2 IMPLANT
GUIDEWIRE INQWIRE 1.5J.035X260 (WIRE) ×1 IMPLANT
INQWIRE 1.5J .035X260CM (WIRE) ×2
KIT HEART LEFT (KITS) ×2 IMPLANT
PACK CARDIAC CATHETERIZATION (CUSTOM PROCEDURE TRAY) ×2 IMPLANT
SYR MEDRAD MARK V 150ML (SYRINGE) ×2 IMPLANT
TRANSDUCER W/STOPCOCK (MISCELLANEOUS) ×2 IMPLANT
TUBING CIL FLEX 10 FLL-RA (TUBING) ×2 IMPLANT

## 2017-08-21 NOTE — Interval H&P Note (Signed)
History and Physical Interval Note:  08/21/2017 12:40 PM  Bobby [name]  has presented today for surgery, with the diagnosis of cad  The various methods of treatment have been discussed with the patient and family. After consideration of risks, benefits and other options for treatment, the patient has consented to  Procedure(s): LEFT HEART CATH AND CORONARY ANGIOGRAPHY (N/A) as a surgical intervention .  The patient's history has been reviewed, patient examined, no change in status, stable for surgery.  I have reviewed the patient's chart and labs.  Questions were answered to the patient's satisfaction.    Patient Information:   1-2V CAD, no prox LAD A (7) Indication: 20; Score 7 1180 Patient Information:   1-2V-CAD with DS 50-60%  With No FFR, No IVUS I (3) Indication: 21; Score 3 1183 Patient Information:   1-2V-CAD with DS 50-60%  With FFR<=0.8, and/or IVUS signficant reduction in CSA A (7) Indication: 22; Score 7 1184 Patient Information:   1-2V-CAD with DS 50-60%  With FFR>0.8, IVUS not significant I (2) Indication: 23; Score 2 1185 Patient Information:   3V-CAD without LMCA  With Abnormal LV systolic function A (9) Indication: 48; Score 9 1186 Patient Information:   LMCA-CAD A (9) Indication: 49; Score 9 1191 Patient Information:   2V-CAD with prox LAD  PCI A (7) Indication: 62; Score 7 1182 Patient Information:   2V-CAD with prox LAD  CABG A (8) Indication: 62; Score 8 1181 Patient Information:   3V-CAD without LMCA  With Low CAD burden(i.e., 3 focal stenoses, low SYNTAX score)  PCI A (7) Indication: 63; Score 7 1188 Patient Information:   3V-CAD without LMCA  With Low CAD burden(i.e., 3 focal stenoses, low SYNTAX score)  CABG A (9) Indication: 63; Score 9 1187 Patient Information:   3V-CAD without LMCA  E06c - Intermediate-high CAD burden (i.e., multiple diffuse lesions, presence of CTO, or high SYNTAX score)  PCI U  (4) Indication: 64; Score 4 1190 Patient Information:   3V-CAD without LMCA  E06c - Intermediate-high CAD burden (i.e., multiple diffuse lesions, presence of CTO, or high SYNTAX score)  CABG A (9) Indication: 64; Score 9 1189 Patient Information:   LMCA-CAD  With Isolated LMCA stenosis  PCI U (6) Indication: 65; Score 6 1193 Patient Information:   LMCA-CAD  With Isolated LMCA stenosis  CABG A (9) Indication: 65; Score 9 1192 Patient Information:   LMCA-CAD  Additional CAD, low CAD burden (i.e., 1- to 2-vessel additional involvement, low SYNTAX score)  PCI U (5) Indication: 66; Score 5 1195 Patient Information:   LMCA-CAD  Additional CAD, low CAD burden (i.e., 1- to 2-vessel additional involvement, low SYNTAX score)  CABG A (9) Indication: 66; Score 9 1194 Patient Information:   LMCA-CAD  Additional CAD, intermediate-high CAD burden (i.e., 3-vessel involvement, presence of CTO, or high SYNTAX score)  PCI I (3) Indication: 67; Score 3 1197 Patient Information:   LMCA-CAD  Additional CAD, intermediate-high CAD burden (i.e., 3-vessel involvement, presence of CTO, or high SYNTAX score)  CABG A (9) Indication: 67; Score 9       Rema Lievanos J Josselyne Onofrio
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
## 2017-08-21 NOTE — Discharge Instructions (Signed)

## 2017-08-22 ENCOUNTER — Encounter (HOSPITAL_COMMUNITY): Payer: Self-pay | Admitting: Cardiology

## 2017-08-31 ENCOUNTER — Telehealth (HOSPITAL_COMMUNITY): Payer: Self-pay

## 2017-08-31 NOTE — Telephone Encounter (Signed)
I called patient to discuss scheduling for cardiac rehab. Patient declined cardiac rehab due to financial. Referral closed.

## 2017-09-03 ENCOUNTER — Other Ambulatory Visit: Payer: Self-pay | Admitting: *Deleted

## 2017-09-10 ENCOUNTER — Encounter: Payer: Self-pay | Admitting: *Deleted

## 2017-09-10 ENCOUNTER — Encounter: Payer: 59 | Attending: Cardiology | Admitting: *Deleted

## 2017-09-10 VITALS — Ht 73.25 in | Wt 218.2 lb

## 2017-09-10 DIAGNOSIS — Z955 Presence of coronary angioplasty implant and graft: Secondary | ICD-10-CM | POA: Insufficient documentation

## 2017-09-10 DIAGNOSIS — I2102 ST elevation (STEMI) myocardial infarction involving left anterior descending coronary artery: Secondary | ICD-10-CM

## 2017-09-10 DIAGNOSIS — I213 ST elevation (STEMI) myocardial infarction of unspecified site: Secondary | ICD-10-CM | POA: Diagnosis present

## 2017-09-10 DIAGNOSIS — Z48812 Encounter for surgical aftercare following surgery on the circulatory system: Secondary | ICD-10-CM | POA: Insufficient documentation

## 2017-09-10 NOTE — Progress Notes (Signed)
Daily Session Note  Patient Details  Name: Bobby [name] MRN: [id number] Date of Birth: 11-13-1971 Referring Provider:    Encounter Date: 09/10/2017  Check In:     Session Check In - 09/10/17 1253      Check-In   Location [facility]   Staff Present Gerlene Burdock, RN, BSN;Susanne Bice, RN, BSN, CCRP;Jessica Luan Pulling, MA, ACSM RCEP, Exercise Physiologist   Supervising physician immediately available to respond to emergencies See telemetry face sheet for immediately available ER MD   Medication changes reported     No   Fall or balance concerns reported    No   Tobacco Cessation No Change   Warm-up and Cool-down Performed as group-led instruction   Resistance Training Performed Yes   VAD Patient? No     Pain Assessment   Currently in Pain? No/denies         History  Smoking Status  . Former Smoker  . Packs/day: 0.50  . Types: Cigarettes  . Quit date: 07/13/2017  Smokeless Tobacco  . Never Used    Comment: quit 2012    Goals Met:  Proper associated with RPD/PD & O2 Sat Exercise tolerated well No report of cardiac concerns or symptoms Strength training completed today  Goals Unmet:  Not Applicable  Comments:     Dr. Emily Filbert is Medical Director for [facility] and [facility].

## 2017-09-10 NOTE — Patient Instructions (Signed)
Patient Instructions  Patient Details  Name: Bobby [name] MRN: [id number] Date of Birth: May 06, 1971 Referring Provider:  Adrian Prows, MD  Below are the personal goals you chose as well as exercise and nutrition goals. Our goal is to help you keep on track towards obtaining and maintaining your goals. We will be discussing your progress on these goals with you throughout the program.  Initial Exercise Prescription:     Initial Exercise Prescription - 09/10/17 1400      Date of Initial Exercise RX and Referring Provider   Date 09/10/17   Referring Provider Adrian Prows MD     Treadmill   MPH 3   Grade 2.5   Minutes 15   METs 4.33     Recumbant Elliptical   Level 3   RPM 50   Minutes 15   METs 3.5     Elliptical   Level 2   Speed 4.3   Minutes 15     Prescription Details   Frequency (times per week) 3   Duration Progress to 45 minutes of aerobic exercise without signs/symptoms of physical distress     Intensity   THRR 40-80% of Max Heartrate 123-156   Ratings of Perceived Exertion 11-13   Perceived Dyspnea 0-4     Progression   Progression Continue to progress workloads to maintain intensity without signs/symptoms of physical distress.     Resistance Training   Training Prescription Yes   Weight 5 lbs      Exercise Goals: Frequency: Be able to perform aerobic exercise three times per week working toward 3-5 days per week.  Intensity: Work with a perceived exertion of 11 (fairly light) - 15 (hard) as tolerated. Follow your new exercise prescription and watch for changes in prescription as you progress with the program. Changes will be reviewed with you when they are made.  Duration: You should be able to do 30 minutes of continuous aerobic exercise in addition to a 5 minute warm-up and a 5 minute cool-down routine.  Nutrition Goals: Your personal nutrition goals will be established when you do your nutrition analysis with the dietician.  The following are  nutrition guidelines to follow: Cholesterol < 200mg /day Sodium < 1500mg /day Fiber: Men under 50 yrs - 38 grams per day  Personal Goals:     Personal Goals and Risk Factors at Admission - 09/10/17 1257      Core Components/Risk Factors/Patient Goals on Admission    Weight Management Yes;Weight Loss   Intervention Weight Management: Develop a combined nutrition and exercise program designed to reach desired caloric intake, while maintaining appropriate intake of nutrient and fiber, sodium and fats, and appropriate energy expenditure required for the weight goal.;Weight Management: Provide education and appropriate resources to help participant work on and attain dietary goals.;Weight Management/Obesity: Establish reasonable short term and long term weight goals.;Obesity: Provide education and appropriate resources to help participant work on and attain dietary goals.   Admit Weight 218 lb 3.2 oz (99 kg)   Goal Weight: Short Term 213 lb (96.6 kg)   Goal Weight: Long Term 200 lb (90.7 kg)   Expected Outcomes Short Term: Continue to assess and modify interventions until short term weight is achieved;Weight Loss: Understanding of general recommendations for a balanced deficit meal plan, which promotes 1-2 lb weight loss per week and includes a negative energy balance of [phone] kcal/d;Understanding recommendations for meals to include 15-35% energy as protein, 25-35% energy from fat, 35-60% energy from carbohydrates, less than 200mg  of dietary  cholesterol, 20-35 gm of total fiber daily;Understanding of distribution of calorie intake throughout the day with the consumption of 4-5 meals/snacks;Long Term: Adherence to nutrition and physical activity/exercise program aimed toward attainment of established weight goal   Hypertension Yes   Intervention Provide education on lifestyle modifcations including regular physical activity/exercise, weight management, moderate sodium restriction and increased  consumption of fresh fruit, vegetables, and low fat dairy, alcohol moderation, and smoking cessation.;Monitor prescription use compliance.   Expected Outcomes Short Term: Continued assessment and intervention until BP is < 140/62mm HG in hypertensive participants. < 130/64mm HG in hypertensive participants with diabetes, heart failure or chronic kidney disease.;Long Term: Maintenance of blood pressure at goal levels.   Lipids Yes   Intervention Provide education and support for participant on nutrition & aerobic/resistive exercise along with prescribed medications to achieve LDL 70mg , HDL >40mg .   Expected Outcomes Short Term: Participant states understanding of desired cholesterol values and is compliant with medications prescribed. Participant is following exercise prescription and nutrition guidelines.;Long Term: Cholesterol controlled with medications as prescribed, with individualized exercise RX and with personalized nutrition plan. Value goals: LDL < 70mg , HDL > 40 mg.   Stress Yes   Intervention Offer individual and/or small group education and counseling on adjustment to heart disease, stress management and health-related lifestyle change. Teach and support self-help strategies.;Refer participants experiencing significant psychosocial distress to appropriate mental health specialists for further evaluation and treatment. When possible, include family members and significant others in education/counseling sessions.   Expected Outcomes Short Term: Participant demonstrates changes in health-related behavior, relaxation and other stress management skills, ability to obtain effective social support, and compliance with psychotropic medications if prescribed.;Long Term: Emotional wellbeing is indicated by absence of clinically significant psychosocial distress or social isolation.      Tobacco Use Initial Evaluation: History  Smoking Status  . Former Smoker  . Packs/day: 0.50  . Types: Cigarettes   . Quit date: 07/13/2017  Smokeless Tobacco  . Never Used    Comment: quit 2012    Exercise Goals and Review:     Exercise Goals    Row Name 09/10/17 1453             Exercise Goals   Increase Physical Activity Yes       Intervention Provide advice, education, support and counseling about physical activity/exercise needs.;Develop an individualized exercise prescription for aerobic and resistive training based on initial evaluation findings, risk stratification, comorbidities and participant's personal goals.       Expected Outcomes Achievement of increased cardiorespiratory fitness and enhanced flexibility, muscular endurance and strength shown through measurements of functional capacity and personal statement of participant.       Increase Strength and Stamina Yes       Intervention Provide advice, education, support and counseling about physical activity/exercise needs.;Develop an individualized exercise prescription for aerobic and resistive training based on initial evaluation findings, risk stratification, comorbidities and participant's personal goals.       Expected Outcomes Achievement of increased cardiorespiratory fitness and enhanced flexibility, muscular endurance and strength shown through measurements of functional capacity and personal statement of participant.       Able to understand and use rate of perceived exertion (RPE) scale Yes       Intervention Provide education and explanation on how to use RPE scale       Expected Outcomes Short Term: Able to use RPE daily in rehab to express subjective intensity level;Long Term:  Able to use RPE to guide intensity  level when exercising independently       Knowledge and understanding of Target Heart Rate Range (THRR) Yes       Intervention Provide education and explanation of THRR including how the numbers were predicted and where they are located for reference       Expected Outcomes Short Term: Able to state/look up THRR;Long  Term: Able to use THRR to govern intensity when exercising independently;Short Term: Able to use daily as guideline for intensity in rehab       Able to check pulse independently Yes       Intervention Provide education and demonstration on how to check pulse in carotid and radial arteries.;Review the importance of being able to check your own pulse for safety during independent exercise       Expected Outcomes Short Term: Able to explain why pulse checking is important during independent exercise;Long Term: Able to check pulse independently and accurately       Understanding of Exercise Prescription Yes       Intervention Provide education, explanation, and written materials on patient's individual exercise prescription       Expected Outcomes Long Term: Able to explain home exercise prescription to exercise independently;Short Term: Able to explain program exercise prescription          Copy of goals given to participant.

## 2017-09-10 NOTE — Progress Notes (Signed)
Cardiac Individual Treatment Plan  Patient Details  Name: Bobby [name] MRN: [id number] Date of Birth: 25-Mar-1971 Referring Provider:     Cardiac Rehab from 09/10/2017 in [facility] Cardiac and Pulmonary Rehab  Referring Provider  Adrian Prows MD       Initial Encounter Date:    Cardiac Rehab from 09/10/2017 in [facility] Cardiac and Pulmonary Rehab  Date  09/10/17  Referring Provider  Adrian Prows MD      Visit Diagnosis: ST elevation myocardial infarction involving left anterior descending (LAD) coronary artery ([place])  Patient's Home Medications on Admission:  Current Outpatient Prescriptions:  .  aspirin 81 MG tablet, Take 81 mg by mouth daily., Disp: , Rfl:  .  carvedilol (COREG) 3.125 MG tablet, Take 1 tablet (3.125 mg total) by mouth 2 (two) times daily with a meal., Disp: 90 tablet, Rfl: 0 .  Cholecalciferol (VITAMIN D) 2000 units CAPS, Take 2,000 Units by mouth 3 (three) times a week., Disp: , Rfl:  .  levothyroxine (SYNTHROID, LEVOTHROID) 137 MCG tablet, Take 137 mcg by mouth daily before breakfast., Disp: , Rfl:  .  Multiple Vitamin (MULTIVITAMIN WITH MINERALS) TABS tablet, Take 1 tablet by mouth daily., Disp: , Rfl:  .  nitroGLYCERIN (NITROSTAT) 0.4 MG SL tablet, Place 1 tablet (0.4 mg total) under the tongue every 5 (five) minutes as needed for chest pain., Disp: 25 tablet, Rfl: 3 .  rosuvastatin (CRESTOR) 20 MG tablet, Take 1 tablet (20 mg total) by mouth daily. (Patient taking differently: Take 20 mg by mouth every evening. ), Disp: 90 tablet, Rfl: 0 .  sacubitril-valsartan (ENTRESTO) 24-26 MG, Take 1 tablet by mouth 2 (two) times daily., Disp: , Rfl:  .  ticagrelor (BRILINTA) 90 MG TABS tablet, Take 1 tablet (90 mg total) by mouth 2 (two) times daily., Disp: 60 tablet, Rfl: 0  Past Medical History: Past Medical History:  Diagnosis Date  . Graves disease   . Hypertension   . MI (myocardial infarction) ([place])   . Thyroid disease    thyroid irradiated per pt    Tobacco Use: History   Smoking Status  . Former Smoker  . Packs/day: 0.50  . Types: Cigarettes  . Quit date: 07/13/2017  Smokeless Tobacco  . Never Used    Comment: quit 2012    Labs: Recent Review Flowsheet Data    Labs for ITP Cardiac and Pulmonary Rehab Latest Ref Rng & Units 03/15/2012 07/13/2017 07/14/2017   Cholestrol 0 - 200 mg/dL - - 130   LDLCALC 0 - 99 mg/dL - - 67   HDL >40 mg/dL - - 37(L)   Trlycerides <150 mg/dL - - 129   Hemoglobin A1c 4.8 - 5.6 % - 6.0(H) -   TCO2 0 - 100 mmol/L 27 - -   O2SAT % - 65.8 -       Exercise Target Goals: Date: 09/10/17  Exercise Program Goal: Individual exercise prescription set with THRR, safety & activity barriers. Participant demonstrates ability to understand and report RPE using BORG scale, to self-measure pulse accurately, and to acknowledge the importance of the exercise prescription.  Exercise Prescription Goal: Starting with aerobic activity 30 plus minutes a day, 3 days per week for initial exercise prescription. Provide home exercise prescription and guidelines that participant acknowledges understanding prior to discharge.  Activity Barriers & Risk Stratification:     Activity Barriers & Cardiac Risk Stratification - 09/10/17 1255      Activity Barriers & Cardiac Risk Stratification   Activity Barriers Chest Pain/Angina  Hx of throat tightness   Cardiac Risk Stratification High      6 Minute Walk:     6 Minute Walk    Row Name 09/10/17 1445         6 Minute Walk   Phase Initial     Distance 1333 feet     Walk Time 6 minutes     # of Rest Breaks 0     MPH 2.52     METS 4.32     RPE 7     VO2 Peak 15.11     Symptoms No     Resting HR 89 bpm     Resting BP 146/74     Resting Oxygen Saturation  99 %     Exercise Oxygen Saturation  during 6 min walk 98 %     Max Ex. HR 115 bpm     Max Ex. BP 124/70     2 Minute Post BP 116/62        Oxygen Initial Assessment:     Oxygen Initial Assessment - 09/10/17 1255       Initial 6 min Walk   Oxygen Used None      Oxygen Re-Evaluation:   Oxygen Discharge (Final Oxygen Re-Evaluation):   Initial Exercise Prescription:     Initial Exercise Prescription - 09/10/17 1400      Date of Initial Exercise RX and Referring Provider   Date 09/10/17   Referring Provider Adrian Prows MD     Treadmill   MPH 3   Grade 2.5   Minutes 15   METs 4.33     Recumbant Elliptical   Level 3   RPM 50   Minutes 15   METs 3.5     Elliptical   Level 2   Speed 4.3   Minutes 15     Prescription Details   Frequency (times per week) 3   Duration Progress to 45 minutes of aerobic exercise without signs/symptoms of physical distress     Intensity   THRR 40-80% of Max Heartrate 123-156   Ratings of Perceived Exertion 11-13   Perceived Dyspnea 0-4     Progression   Progression Continue to progress workloads to maintain intensity without signs/symptoms of physical distress.     Resistance Training   Training Prescription Yes   Weight 5 lbs      Perform Capillary Blood Glucose checks as needed.  Exercise Prescription Changes:      Exercise Prescription Changes    Row Name 09/10/17 1400             Response to Exercise   Blood Pressure (Admit) 146/74       Blood Pressure (Exercise) 124/70       Blood Pressure (Exit) 116/62       Heart Rate (Admit) 89 bpm       Heart Rate (Exercise) 115 bpm       Heart Rate (Exit) 83 bpm       Oxygen Saturation (Admit) 99 %       Oxygen Saturation (Exercise) 98 %       Rating of Perceived Exertion (Exercise) 7       Symptoms none       Comments walk test results       Duration Progress to 45 minutes of aerobic exercise without signs/symptoms of physical distress       Intensity THRR unchanged          Exercise Comments:  Exercise Goals and Review:      Exercise Goals    Row Name 09/10/17 1453             Exercise Goals   Increase Physical Activity Yes       Intervention Provide advice,  education, support and counseling about physical activity/exercise needs.;Develop an individualized exercise prescription for aerobic and resistive training based on initial evaluation findings, risk stratification, comorbidities and participant's personal goals.       Expected Outcomes Achievement of increased cardiorespiratory fitness and enhanced flexibility, muscular endurance and strength shown through measurements of functional capacity and personal statement of participant.       Increase Strength and Stamina Yes       Intervention Provide advice, education, support and counseling about physical activity/exercise needs.;Develop an individualized exercise prescription for aerobic and resistive training based on initial evaluation findings, risk stratification, comorbidities and participant's personal goals.       Expected Outcomes Achievement of increased cardiorespiratory fitness and enhanced flexibility, muscular endurance and strength shown through measurements of functional capacity and personal statement of participant.       Able to understand and use rate of perceived exertion (RPE) scale Yes       Intervention Provide education and explanation on how to use RPE scale       Expected Outcomes Short Term: Able to use RPE daily in rehab to express subjective intensity level;Long Term:  Able to use RPE to guide intensity level when exercising independently       Knowledge and understanding of Target Heart Rate Range (THRR) Yes       Intervention Provide education and explanation of THRR including how the numbers were predicted and where they are located for reference       Expected Outcomes Short Term: Able to state/look up THRR;Long Term: Able to use THRR to govern intensity when exercising independently;Short Term: Able to use daily as guideline for intensity in rehab       Able to check pulse independently Yes       Intervention Provide education and demonstration on how to check pulse in  carotid and radial arteries.;Review the importance of being able to check your own pulse for safety during independent exercise       Expected Outcomes Short Term: Able to explain why pulse checking is important during independent exercise;Long Term: Able to check pulse independently and accurately       Understanding of Exercise Prescription Yes       Intervention Provide education, explanation, and written materials on patient's individual exercise prescription       Expected Outcomes Long Term: Able to explain home exercise prescription to exercise independently;Short Term: Able to explain program exercise prescription          Exercise Goals Re-Evaluation :   Discharge Exercise Prescription (Final Exercise Prescription Changes):     Exercise Prescription Changes - 09/10/17 1400      Response to Exercise   Blood Pressure (Admit) 146/74   Blood Pressure (Exercise) 124/70   Blood Pressure (Exit) 116/62   Heart Rate (Admit) 89 bpm   Heart Rate (Exercise) 115 bpm   Heart Rate (Exit) 83 bpm   Oxygen Saturation (Admit) 99 %   Oxygen Saturation (Exercise) 98 %   Rating of Perceived Exertion (Exercise) 7   Symptoms none   Comments walk test results   Duration Progress to 45 minutes of aerobic exercise without signs/symptoms of physical distress   Intensity THRR  unchanged      Nutrition:  Target Goals: Understanding of nutrition guidelines, daily intake of sodium 1500mg , cholesterol 200mg , calories 30% from fat and 7% or less from saturated fats, daily to have 5 or more servings of fruits and vegetables.  Biometrics:     Pre Biometrics - 09/10/17 1454      Pre Biometrics   Height 6' 1.25" (1.861 m)   Weight 218 lb 3.2 oz (99 kg)   Waist Circumference 40 inches   Hip Circumference 39.75 inches   Waist to Hip Ratio 1.01 %   BMI (Calculated) 28.58   Single Leg Stand 30 seconds       Nutrition Therapy Plan and Nutrition Goals:     Nutrition Therapy & Goals - 09/10/17  1303      Nutrition Therapy   RD appointment defered Yes     Personal Nutrition Goals   Comments Bobby [name] says he follows a 1500mg  Low Sodium diet.       Nutrition Discharge: Rate Your Plate Scores:     Nutrition Assessments - 09/10/17 1247      MEDFICTS Scores   Pre Score 32      Nutrition Goals Re-Evaluation:   Nutrition Goals Discharge (Final Nutrition Goals Re-Evaluation):   Psychosocial: Target Goals: Acknowledge presence or absence of significant depression and/or stress, maximize coping skills, provide positive support system. Participant is able to verbalize types and ability to use techniques and skills needed for reducing stress and depression.   Initial Review & Psychosocial Screening:     Initial Psych Review & Screening - 09/10/17 1259      Initial Review   Current issues with History of Depression;Current Anxiety/Panic;Current Stress Concerns   Source of Stress Concerns Family   Comments Bobby [name] said he did not have plans to hurt himself. His PHQ9 score was 14.     Family Dynamics   Good Support System? Yes     Barriers   Psychosocial barriers to participate in program The patient should benefit from training in stress management and relaxation.     Screening Interventions   Interventions Encouraged to exercise;Program counselor consult;Yes   Expected Outcomes Short Term goal: Utilizing psychosocial counselor, staff and physician to assist with identification of specific Stressors or current issues interfering with healing process. Setting desired goal for each stressor or current issue identified.;Long Term Goal: Stressors or current issues are controlled or eliminated.;Short Term goal: Identification and review with participant of any Quality of Life or Depression concerns found by scoring the questionnaire.;Long Term goal: The participant improves quality of Life and PHQ9 Scores as seen by post scores and/or verbalization of changes      Quality of Life  Scores:      Quality of Life - 09/10/17 1248      Quality of Life Scores   Health/Function Pre 12 %   Socioeconomic Pre 19.57 %   Psych/Spiritual Pre 10.14 %   Family Pre 24 %   GLOBAL Pre 14.94 %      PHQ-9: Recent Review Flowsheet Data    Depression screen [facility] 2/9 09/10/2017 09/10/2017   Decreased Interest - 2   Down, Depressed, Hopeless - 2   PHQ - 2 Score - 4   Altered sleeping - 2   Tired, decreased energy - 3   Change in appetite - 0   Feeling bad or failure about yourself  - 2   Trouble concentrating - 2   Moving slowly or fidgety/restless - 0  Suicidal thoughts (No Data)  1   PHQ-9 Score - 14   Difficult doing work/chores - Somewhat difficult     Interpretation of Total Score  Total Score Depression Severity:  1-4 = Minimal depression, 5-9 = Mild depression, 10-14 = Moderate depression, 15-19 = Moderately severe depression, 20-27 = Severe depression   Psychosocial Evaluation and Intervention:   Psychosocial Re-Evaluation:   Psychosocial Discharge (Final Psychosocial Re-Evaluation):   Vocational Rehabilitation: Provide vocational rehab assistance to qualifying candidates.   Vocational Rehab Evaluation & Intervention:     Vocational Rehab - 09/10/17 1249      Initial Vocational Rehab Evaluation & Intervention   Assessment shows need for Vocational Rehabilitation No      Education: Education Goals: Education classes will be provided on a variety of topics geared toward better understanding of heart health and risk factor modification. Participant will state understanding/return demonstration of topics presented as noted by education test scores.  Learning Barriers/Preferences:     Learning Barriers/Preferences - 09/10/17 1252      Learning Barriers/Preferences   Learning Barriers None   Learning Preferences Individual Instruction      Education Topics: General Nutrition Guidelines/Fats and Fiber: -Group instruction provided by verbal,  written material, models and posters to present the general guidelines for heart healthy nutrition. Gives an explanation and review of dietary fats and fiber.   Controlling Sodium/Reading Food Labels: -Group verbal and written material supporting the discussion of sodium use in heart healthy nutrition. Review and explanation with models, verbal and written materials for utilization of the food label.   Exercise Physiology & Risk Factors: - Group verbal and written instruction with models to review the exercise physiology of the cardiovascular system and associated critical values. Details cardiovascular disease risk factors and the goals associated with each risk factor.   Aerobic Exercise & Resistance Training: - Gives group verbal and written discussion on the health impact of inactivity. On the components of aerobic and resistive training programs and the benefits of this training and how to safely progress through these programs.   Flexibility, Balance, General Exercise Guidelines: - Provides group verbal and written instruction on the benefits of flexibility and balance training programs. Provides general exercise guidelines with specific guidelines to those with heart or lung disease. Demonstration and skill practice provided.   Stress Management: - Provides group verbal and written instruction about the health risks of elevated stress, cause of high stress, and healthy ways to reduce stress.   Depression: - Provides group verbal and written instruction on the correlation between heart/lung disease and depressed mood, treatment options, and the stigmas associated with seeking treatment.   Anatomy & Physiology of the Heart: - Group verbal and written instruction and models provide basic cardiac anatomy and physiology, with the coronary electrical and arterial systems. Review of: AMI, Angina, Valve disease, Heart Failure, Cardiac Arrhythmia, Pacemakers, and the ICD.   Cardiac  Procedures: - Group verbal and written instruction to review commonly prescribed medications for heart disease. Reviews the medication, class of the drug, and side effects. Includes the steps to properly store meds and maintain the prescription regimen. (beta blockers and nitrates)   Cardiac Medications I: - Group verbal and written instruction to review commonly prescribed medications for heart disease. Reviews the medication, class of the drug, and side effects. Includes the steps to properly store meds and maintain the prescription regimen.   Cardiac Medications II: -Group verbal and written instruction to review commonly prescribed medications for heart disease. Reviews  the medication, class of the drug, and side effects. (all other drug classes)    Go Sex-Intimacy & Heart Disease, Get SMART - Goal Setting: - Group verbal and written instruction through game format to discuss heart disease and the return to sexual intimacy. Provides group verbal and written material to discuss and apply goal setting through the application of the S.M.A.R.T. Method.   Other Matters of the Heart: - Provides group verbal, written materials and models to describe Heart Failure, Angina, Valve Disease, Peripheral Artery Disease, and Diabetes in the realm of heart disease. Includes description of the disease process and treatment options available to the cardiac patient.   Exercise & Equipment Safety: - Individual verbal instruction and demonstration of equipment use and safety with use of the equipment.   Cardiac Rehab from 09/10/2017 in [facility] Cardiac and Pulmonary Rehab  Date  09/10/17  Educator  C. EnterkinRN  Instruction Review Code  3- Needs Reinforcement      Infection Prevention: - Provides verbal and written material to individual with discussion of infection control including proper hand washing and proper equipment cleaning during exercise session.   Cardiac Rehab from 09/10/2017 in [facility] Cardiac  and Pulmonary Rehab  Date  09/10/17  Educator  C. EnterkinRN  Instruction Review Code  1- Verbalizes Understanding      Falls Prevention: - Provides verbal and written material to individual with discussion of falls prevention and safety.   Cardiac Rehab from 09/10/2017 in [facility] Cardiac and Pulmonary Rehab  Date  09/10/17  Educator  C. [place]  Instruction Review Code  1- Verbalizes Understanding      Diabetes: - Individual verbal and written instruction to review signs/symptoms of diabetes, desired ranges of glucose level fasting, after meals and with exercise. Acknowledge that pre and post exercise glucose checks will be done for 3 sessions at entry of program.   Other: -Provides group and verbal instruction on various topics (see comments)    Knowledge Questionnaire Score:     Knowledge Questionnaire Score - 09/10/17 1249      Knowledge Questionnaire Score   Pre Score 27      Core Components/Risk Factors/Patient Goals at Admission:     Personal Goals and Risk Factors at Admission - 09/10/17 1257      Core Components/Risk Factors/Patient Goals on Admission    Weight Management Yes;Weight Loss   Intervention Weight Management: Develop a combined nutrition and exercise program designed to reach desired caloric intake, while maintaining appropriate intake of nutrient and fiber, sodium and fats, and appropriate energy expenditure required for the weight goal.;Weight Management: Provide education and appropriate resources to help participant work on and attain dietary goals.;Weight Management/Obesity: Establish reasonable short term and long term weight goals.;Obesity: Provide education and appropriate resources to help participant work on and attain dietary goals.   Admit Weight 218 lb 3.2 oz (99 kg)   Goal Weight: Short Term 213 lb (96.6 kg)   Goal Weight: Long Term 200 lb (90.7 kg)   Expected Outcomes Short Term: Continue to assess and modify interventions until short  term weight is achieved;Weight Loss: Understanding of general recommendations for a balanced deficit meal plan, which promotes 1-2 lb weight loss per week and includes a negative energy balance of [phone] kcal/d;Understanding recommendations for meals to include 15-35% energy as protein, 25-35% energy from fat, 35-60% energy from carbohydrates, less than 200mg  of dietary cholesterol, 20-35 gm of total fiber daily;Understanding of distribution of calorie intake throughout the day with the consumption of  4-5 meals/snacks;Long Term: Adherence to nutrition and physical activity/exercise program aimed toward attainment of established weight goal   Hypertension Yes   Intervention Provide education on lifestyle modifcations including regular physical activity/exercise, weight management, moderate sodium restriction and increased consumption of fresh fruit, vegetables, and low fat dairy, alcohol moderation, and smoking cessation.;Monitor prescription use compliance.   Expected Outcomes Short Term: Continued assessment and intervention until BP is < 140/61mm HG in hypertensive participants. < 130/6mm HG in hypertensive participants with diabetes, heart failure or chronic kidney disease.;Long Term: Maintenance of blood pressure at goal levels.   Lipids Yes   Intervention Provide education and support for participant on nutrition & aerobic/resistive exercise along with prescribed medications to achieve LDL 70mg , HDL >40mg .   Expected Outcomes Short Term: Participant states understanding of desired cholesterol values and is compliant with medications prescribed. Participant is following exercise prescription and nutrition guidelines.;Long Term: Cholesterol controlled with medications as prescribed, with individualized exercise RX and with personalized nutrition plan. Value goals: LDL < 70mg , HDL > 40 mg.   Stress Yes   Intervention Offer individual and/or small group education and counseling on adjustment to heart  disease, stress management and health-related lifestyle change. Teach and support self-help strategies.;Refer participants experiencing significant psychosocial distress to appropriate mental health specialists for further evaluation and treatment. When possible, include family members and significant others in education/counseling sessions.   Expected Outcomes Short Term: Participant demonstrates changes in health-related behavior, relaxation and other stress management skills, ability to obtain effective social support, and compliance with psychotropic medications if prescribed.;Long Term: Emotional wellbeing is indicated by absence of clinically significant psychosocial distress or social isolation.      Core Components/Risk Factors/Patient Goals Review:    Core Components/Risk Factors/Patient Goals at Discharge (Final Review):    ITP Comments:     ITP Comments    Row Name 09/10/17 1254 09/10/17 1330         ITP Comments ITP Created during Cardiac Rehab Medical Review after Cardiac Rehab informed consent was signed.  1.smokes for stress but I am having no problems giving it up after my last heart attack. I talked with him about counseling and he said I haven't done that in the past "why tell other people my problems".  He checked Current Anxiety/Panic on our [place]. Totat PHQ9 14. Age 46.           Comments:

## 2017-09-19 ENCOUNTER — Encounter: Payer: 59 | Admitting: *Deleted

## 2017-09-19 DIAGNOSIS — I2102 ST elevation (STEMI) myocardial infarction involving left anterior descending coronary artery: Secondary | ICD-10-CM

## 2017-09-19 DIAGNOSIS — Z48812 Encounter for surgical aftercare following surgery on the circulatory system: Secondary | ICD-10-CM | POA: Diagnosis not present

## 2017-09-19 NOTE — Progress Notes (Signed)
Daily Session Note  Patient Details  Name: Bobby [name] MRN: [id number] Date of Birth: June 28, 1971 Referring Provider:     Cardiac Rehab from 09/10/2017 in [facility] Cardiac and Pulmonary Rehab  Referring Provider  Adrian Prows MD      Encounter Date: 09/19/2017  Check In:     Session Check In - 09/19/17 1701      Check-In   Location [facility]-Cardiac & Pulmonary Rehab   Staff Present Renita Papa, RN BSN;Carroll Enterkin, RN, Vickki Hearing, BA, ACSM CEP, Exercise Physiologist   Supervising physician immediately available to respond to emergencies See telemetry face sheet for immediately available ER MD   Medication changes reported     No   Fall or balance concerns reported    No   Warm-up and Cool-down Performed on first and last piece of equipment   Resistance Training Performed Yes   VAD Patient? No     Pain Assessment   Currently in Pain? No/denies         History  Smoking Status  . Former Smoker  . Packs/day: 0.50  . Types: Cigarettes  . Quit date: 07/13/2017  Smokeless Tobacco  . Never Used    Comment: quit 2012    Goals Met:  Proper associated with RPD/PD & O2 Sat Independence with exercise equipment Exercise tolerated well No report of cardiac concerns or symptoms Strength training completed today  Goals Unmet:  Not Applicable  Comments: First full day of exercise!  Patient was oriented to gym and equipment including functions, settings, policies, and procedures.  Patient's individual exercise prescription and treatment plan were reviewed.  All starting workloads were established based on the results of the 6 minute walk test done at initial orientation visit.  The plan for exercise progression was also introduced and progression will be customized based on patient's performance and goals.    Dr. Emily Filbert is Medical Director for [place] and [facility].

## 2017-09-20 ENCOUNTER — Encounter: Payer: 59 | Admitting: *Deleted

## 2017-09-20 DIAGNOSIS — I2102 ST elevation (STEMI) myocardial infarction involving left anterior descending coronary artery: Secondary | ICD-10-CM

## 2017-09-20 DIAGNOSIS — Z48812 Encounter for surgical aftercare following surgery on the circulatory system: Secondary | ICD-10-CM | POA: Diagnosis not present

## 2017-09-20 NOTE — Progress Notes (Signed)
Daily Session Note  Patient Details  Name: Delsin Copen MRN: [id number] Date of Birth: 02/05/1971 Referring Provider:     Cardiac Rehab from 09/10/2017 in [facility] Cardiac and Pulmonary Rehab  Referring Provider  Adrian Prows MD      Encounter Date: 09/20/2017  Check In:     Session Check In - 09/20/17 1726      Check-In   Location [facility]-Cardiac & Pulmonary Rehab   Staff Present Earlean Shawl, BS, ACSM CEP, Exercise Physiologist;Joseph Tessie Fass RCP,RRT,BSRT;Carroll Enterkin, RN, BSN   Supervising physician immediately available to respond to emergencies See telemetry face sheet for immediately available ER MD   Medication changes reported     No   Fall or balance concerns reported    No   Warm-up and Cool-down Performed on first and last piece of equipment   Resistance Training Performed Yes   VAD Patient? No     Pain Assessment   Currently in Pain? No/denies   Multiple Pain Sites No         History  Smoking Status  . Former Smoker  . Packs/day: 0.50  . Types: Cigarettes  . Quit date: 07/13/2017  Smokeless Tobacco  . Never Used    Comment: quit 2012    Goals Met:  Independence with exercise equipment Exercise tolerated well No report of cardiac concerns or symptoms Strength training completed today  Goals Unmet:  Not Applicable  Comments: Pt able to follow exercise prescription today without complaint.  Will continue to monitor for progression.    Dr. Emily Filbert is Medical Director for [place] and [facility].

## 2017-09-24 ENCOUNTER — Encounter: Payer: 59 | Attending: Cardiology

## 2017-09-24 DIAGNOSIS — I213 ST elevation (STEMI) myocardial infarction of unspecified site: Secondary | ICD-10-CM | POA: Insufficient documentation

## 2017-09-24 DIAGNOSIS — Z48812 Encounter for surgical aftercare following surgery on the circulatory system: Secondary | ICD-10-CM | POA: Insufficient documentation

## 2017-09-24 DIAGNOSIS — Z955 Presence of coronary angioplasty implant and graft: Secondary | ICD-10-CM | POA: Insufficient documentation

## 2017-09-26 DIAGNOSIS — I213 ST elevation (STEMI) myocardial infarction of unspecified site: Secondary | ICD-10-CM | POA: Diagnosis present

## 2017-09-26 DIAGNOSIS — Z955 Presence of coronary angioplasty implant and graft: Secondary | ICD-10-CM | POA: Diagnosis present

## 2017-09-26 DIAGNOSIS — I2102 ST elevation (STEMI) myocardial infarction involving left anterior descending coronary artery: Secondary | ICD-10-CM

## 2017-09-26 DIAGNOSIS — Z48812 Encounter for surgical aftercare following surgery on the circulatory system: Secondary | ICD-10-CM | POA: Diagnosis not present

## 2017-09-26 NOTE — Progress Notes (Signed)
Daily Session Note  Patient Details  Name: Vansh Reckart MRN: [id number] Date of Birth: [birth year] Referring Provider:     Cardiac Rehab from 09/10/2017 in [facility] Cardiac and Pulmonary Rehab  Referring Provider  Adrian Prows MD      Encounter Date: 09/26/2017  Check In:     Session Check In - 09/26/17 1706      Check-In   Location [facility]-Cardiac & Pulmonary Rehab   Staff Present Renita Papa, RN Vickki Hearing, BA, ACSM CEP, Exercise Physiologist;Carroll Enterkin, RN, BSN   Supervising physician immediately available to respond to emergencies See telemetry face sheet for immediately available ER MD   Medication changes reported     No   Fall or balance concerns reported    No   Warm-up and Cool-down Performed on first and last piece of equipment   Resistance Training Performed Yes   VAD Patient? No     Pain Assessment   Currently in Pain? No/denies         History  Smoking Status  . Former Smoker  . Packs/day: 0.50  . Types: Cigarettes  . Quit date: 07/13/2017  Smokeless Tobacco  . Never Used    Comment: quit 2012    Goals Met:  Independence with exercise equipment Exercise tolerated well No report of cardiac concerns or symptoms Strength training completed today  Goals Unmet:  Not Applicable  Comments: Pt able to follow exercise prescription today without complaint.  Will continue to monitor for progression.    Dr. Emily Filbert is Medical Director for [place] and [facility].

## 2017-09-27 DIAGNOSIS — Z48812 Encounter for surgical aftercare following surgery on the circulatory system: Secondary | ICD-10-CM | POA: Diagnosis not present

## 2017-09-27 DIAGNOSIS — I2102 ST elevation (STEMI) myocardial infarction involving left anterior descending coronary artery: Secondary | ICD-10-CM

## 2017-09-27 NOTE — Progress Notes (Signed)
Daily Session Note  Patient Details  Name: Bobby [name] MRN: [id number] Date of Birth: Jul 04, 1971 Referring Provider:     Cardiac Rehab from 09/10/2017 in [facility] Cardiac and Pulmonary Rehab  Referring Provider  Adrian Prows MD      Encounter Date: 09/27/2017  Check In:     Session Check In - 09/27/17 1628      Check-In   Location [facility]-Cardiac & Pulmonary Rehab   Staff Present Gerlene Burdock, RN, Moises Blood, BS, ACSM CEP, Exercise Physiologist;Cheria Sadiq Flavia Shipper   Supervising physician immediately available to respond to emergencies See telemetry face sheet for immediately available ER MD   Medication changes reported     No   Fall or balance concerns reported    No   Warm-up and Cool-down Performed on first and last piece of equipment   Resistance Training Performed Yes   VAD Patient? No     Pain Assessment   Currently in Pain? No/denies         History  Smoking Status  . Former Smoker  . Packs/day: 0.50  . Types: Cigarettes  . Quit date: 07/13/2017  Smokeless Tobacco  . Never Used    Comment: quit 2012    Goals Met:  Independence with exercise equipment Exercise tolerated well Strength training completed today  Goals Unmet:  Not Applicable  Comments: Pt able to follow exercise prescription today without complaint.  Will continue to monitor for progression.   Dr. Emily Filbert is Medical Director for [place] and [facility].

## 2017-10-03 ENCOUNTER — Encounter: Payer: Self-pay | Admitting: *Deleted

## 2017-10-03 DIAGNOSIS — I2102 ST elevation (STEMI) myocardial infarction involving left anterior descending coronary artery: Secondary | ICD-10-CM

## 2017-10-03 NOTE — Progress Notes (Signed)
Cardiac Individual Treatment Plan  Patient Details  Name: Bobby [name] MRN: [id number] Date of Birth: September 29, 1971 Referring Provider:     Cardiac Rehab from 09/10/2017 in [facility] Cardiac and Pulmonary Rehab  Referring Provider  Adrian Prows MD      Initial Encounter Date:    Cardiac Rehab from 09/10/2017 in [facility] Cardiac and Pulmonary Rehab  Date  09/10/17  Referring Provider  Adrian Prows MD      Visit Diagnosis: No diagnosis found.  Patient's Home Medications on Admission:  Current Outpatient Prescriptions:  .  aspirin 81 MG tablet, Take 81 mg by mouth daily., Disp: , Rfl:  .  carvedilol (COREG) 3.125 MG tablet, Take 1 tablet (3.125 mg total) by mouth 2 (two) times daily with a meal., Disp: 90 tablet, Rfl: 0 .  Cholecalciferol (VITAMIN D) 2000 units CAPS, Take 2,000 Units by mouth 3 (three) times a week., Disp: , Rfl:  .  levothyroxine (SYNTHROID, LEVOTHROID) 137 MCG tablet, Take 137 mcg by mouth daily before breakfast., Disp: , Rfl:  .  Multiple Vitamin (MULTIVITAMIN WITH MINERALS) TABS tablet, Take 1 tablet by mouth daily., Disp: , Rfl:  .  nitroGLYCERIN (NITROSTAT) 0.4 MG SL tablet, Place 1 tablet (0.4 mg total) under the tongue every 5 (five) minutes as needed for chest pain., Disp: 25 tablet, Rfl: 3 .  rosuvastatin (CRESTOR) 20 MG tablet, Take 1 tablet (20 mg total) by mouth daily. (Patient taking differently: Take 20 mg by mouth every evening. ), Disp: 90 tablet, Rfl: 0 .  sacubitril-valsartan (ENTRESTO) 24-26 MG, Take 1 tablet by mouth 2 (two) times daily., Disp: , Rfl:  .  ticagrelor (BRILINTA) 90 MG TABS tablet, Take 1 tablet (90 mg total) by mouth 2 (two) times daily., Disp: 60 tablet, Rfl: 0  Past Medical History: Past Medical History:  Diagnosis Date  . Graves disease   . Hypertension   . MI (myocardial infarction) ([place])   . Thyroid disease    thyroid irradiated per pt    Tobacco Use: History  Smoking Status  . Former Smoker  . Packs/day: 0.50  . Types: Cigarettes   . Quit date: 07/13/2017  Smokeless Tobacco  . Never Used    Comment: quit 2012    Labs: Recent Review Flowsheet Data    Labs for ITP Cardiac and Pulmonary Rehab Latest Ref Rng & Units 03/15/2012 07/13/2017 07/14/2017   Cholestrol 0 - 200 mg/dL - - 130   LDLCALC 0 - 99 mg/dL - - 67   HDL >40 mg/dL - - 37(L)   Trlycerides <150 mg/dL - - 129   Hemoglobin A1c 4.8 - 5.6 % - 6.0(H) -   TCO2 0 - 100 mmol/L 27 - -   O2SAT % - 65.8 -       Exercise Target Goals:    Exercise Program Goal: Individual exercise prescription set with THRR, safety & activity barriers. Participant demonstrates ability to understand and report RPE using BORG scale, to self-measure pulse accurately, and to acknowledge the importance of the exercise prescription.  Exercise Prescription Goal: Starting with aerobic activity 30 plus minutes a day, 3 days per week for initial exercise prescription. Provide home exercise prescription and guidelines that participant acknowledges understanding prior to discharge.  Activity Barriers & Risk Stratification:     Activity Barriers & Cardiac Risk Stratification - 09/10/17 1255      Activity Barriers & Cardiac Risk Stratification   Activity Barriers Chest Pain/Angina  Hx of throat tightness   Cardiac Risk Stratification  High      6 Minute Walk:     6 Minute Walk    Row Name 09/10/17 1445         6 Minute Walk   Phase Initial     Distance 1333 feet     Walk Time 6 minutes     # of Rest Breaks 0     MPH 2.52     METS 4.32     RPE 7     VO2 Peak 15.11     Symptoms No     Resting HR 89 bpm     Resting BP 146/74     Resting Oxygen Saturation  99 %     Exercise Oxygen Saturation  during 6 min walk 98 %     Max Ex. HR 115 bpm     Max Ex. BP 124/70     2 Minute Post BP 116/62        Oxygen Initial Assessment:     Oxygen Initial Assessment - 09/10/17 1255      Initial 6 min Walk   Oxygen Used None      Oxygen Re-Evaluation:   Oxygen Discharge  (Final Oxygen Re-Evaluation):   Initial Exercise Prescription:     Initial Exercise Prescription - 09/10/17 1400      Date of Initial Exercise RX and Referring Provider   Date 09/10/17   Referring Provider Adrian Prows MD     Treadmill   MPH 3   Grade 2.5   Minutes 15   METs 4.33     Recumbant Elliptical   Level 3   RPM 50   Minutes 15   METs 3.5     Elliptical   Level 2   Speed 4.3   Minutes 15     Prescription Details   Frequency (times per week) 3   Duration Progress to 45 minutes of aerobic exercise without signs/symptoms of physical distress     Intensity   THRR 40-80% of Max Heartrate 123-156   Ratings of Perceived Exertion 11-13   Perceived Dyspnea 0-4     Progression   Progression Continue to progress workloads to maintain intensity without signs/symptoms of physical distress.     Resistance Training   Training Prescription Yes   Weight 5 lbs      Perform Capillary Blood Glucose checks as needed.  Exercise Prescription Changes:     Exercise Prescription Changes    Row Name 09/10/17 1400 09/28/17 0900           Response to Exercise   Blood Pressure (Admit) 146/74 112/82      Blood Pressure (Exercise) 124/70 142/66      Blood Pressure (Exit) 116/62 120/76      Heart Rate (Admit) 89 bpm 93 bpm      Heart Rate (Exercise) 115 bpm 150 bpm      Heart Rate (Exit) 83 bpm 122 bpm      Oxygen Saturation (Admit) 99 %  -      Oxygen Saturation (Exercise) 98 %  -      Rating of Perceived Exertion (Exercise) 7 13      Symptoms none none      Comments walk test results  -      Duration Progress to 45 minutes of aerobic exercise without signs/symptoms of physical distress Progress to 45 minutes of aerobic exercise without signs/symptoms of physical distress      Intensity THRR unchanged THRR unchanged  Resistance Training   Training Prescription  - Yes      Weight  - 5 lb      Reps  - 10-15        Interval Training   Interval Training  - No         Treadmill   MPH  - 3      Grade  - 2.5      Minutes  - 15      METs  - 4.33        Recumbant Elliptical   Level  - 3      RPM  - 50      Minutes  - 15      METs  - 4        Elliptical   Level  - 2      Speed  - 4.3      Minutes  - 15         Exercise Comments:     Exercise Comments    Row Name 09/19/17 1703           Exercise Comments First full day of exercise!  Patient was oriented to gym and equipment including functions, settings, policies, and procedures.  Patient's individual exercise prescription and treatment plan were reviewed.  All starting workloads were established based on the results of the 6 minute walk test done at initial orientation visit.  The plan for exercise progression was also introduced and progression will be customized based on patient's performance and goals.          Exercise Goals and Review:     Exercise Goals    Row Name 09/10/17 1453             Exercise Goals   Increase Physical Activity Yes       Intervention Provide advice, education, support and counseling about physical activity/exercise needs.;Develop an individualized exercise prescription for aerobic and resistive training based on initial evaluation findings, risk stratification, comorbidities and participant's personal goals.       Expected Outcomes Achievement of increased cardiorespiratory fitness and enhanced flexibility, muscular endurance and strength shown through measurements of functional capacity and personal statement of participant.       Increase Strength and Stamina Yes       Intervention Provide advice, education, support and counseling about physical activity/exercise needs.;Develop an individualized exercise prescription for aerobic and resistive training based on initial evaluation findings, risk stratification, comorbidities and participant's personal goals.       Expected Outcomes Achievement of increased cardiorespiratory fitness and enhanced  flexibility, muscular endurance and strength shown through measurements of functional capacity and personal statement of participant.       Able to understand and use rate of perceived exertion (RPE) scale Yes       Intervention Provide education and explanation on how to use RPE scale       Expected Outcomes Short Term: Able to use RPE daily in rehab to express subjective intensity level;Long Term:  Able to use RPE to guide intensity level when exercising independently       Knowledge and understanding of Target Heart Rate Range (THRR) Yes       Intervention Provide education and explanation of THRR including how the numbers were predicted and where they are located for reference       Expected Outcomes Short Term: Able to state/look up THRR;Long Term: Able to use THRR to govern intensity when exercising independently;Short Term:  Able to use daily as guideline for intensity in rehab       Able to check pulse independently Yes       Intervention Provide education and demonstration on how to check pulse in carotid and radial arteries.;Review the importance of being able to check your own pulse for safety during independent exercise       Expected Outcomes Short Term: Able to explain why pulse checking is important during independent exercise;Long Term: Able to check pulse independently and accurately       Understanding of Exercise Prescription Yes       Intervention Provide education, explanation, and written materials on patient's individual exercise prescription       Expected Outcomes Long Term: Able to explain home exercise prescription to exercise independently;Short Term: Able to explain program exercise prescription          Exercise Goals Re-Evaluation :     Exercise Goals Re-Evaluation    Row Name 09/28/17 0951             Exercise Goal Re-Evaluation   Exercise Goals Review Increase Physical Activity;Increase Strength and Stamina;Able to understand and use rate of perceived  exertion (RPE) scale       Comments Thomson is tolerating exercise well.  Staff will contiue to monitor for progress.       Expected Outcomes Short - Ephram will attend HT regularly.  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          Discharge Exercise Prescription (Final Exercise Prescription Changes):     Exercise Prescription Changes - 09/28/17 0900      Response to Exercise   Blood Pressure (Admit) 112/82   Blood Pressure (Exercise) 142/66   Blood Pressure (Exit) 120/76   Heart Rate (Admit) 93 bpm   Heart Rate (Exercise) 150 bpm   Heart Rate (Exit) 122 bpm   Rating of Perceived Exertion (Exercise) 13   Symptoms none   Duration Progress to 45 minutes of aerobic exercise without signs/symptoms of physical distress   Intensity THRR unchanged     Resistance Training   Training Prescription Yes   Weight 5 lb   Reps 10-15     Interval Training   Interval Training No     Treadmill   MPH 3   Grade 2.5   Minutes 15   METs 4.33     Recumbant Elliptical   Level 3   RPM 50   Minutes 15   METs 4     Elliptical   Level 2   Speed 4.3   Minutes 15      Nutrition:  Target Goals: Understanding of nutrition guidelines, daily intake of sodium <1571m, cholesterol <2029m calories 30% from fat and 7% or less from saturated fats, daily to have 5 or more servings of fruits and vegetables.  Biometrics:     Pre Biometrics - 09/10/17 1454      Pre Biometrics   Height 6' 1.25" (1.861 m)   Weight 218 lb 3.2 oz (99 kg)   Waist Circumference 40 inches   Hip Circumference 39.75 inches   Waist to Hip Ratio 1.01 %   BMI (Calculated) 28.58   Single Leg Stand 30 seconds       Nutrition Therapy Plan and Nutrition Goals:     Nutrition Therapy & Goals - 09/10/17 1303      Nutrition Therapy   RD appointment defered Yes     Personal Nutrition Goals   Comments  Mase says he follows a 1567m Low Sodium diet.       Nutrition Discharge: Rate Your Plate Scores:      Nutrition Assessments - 09/10/17 1247      MEDFICTS Scores   Pre Score 32      Nutrition Goals Re-Evaluation:   Nutrition Goals Discharge (Final Nutrition Goals Re-Evaluation):   Psychosocial: Target Goals: Acknowledge presence or absence of significant depression and/or stress, maximize coping skills, provide positive support system. Participant is able to verbalize types and ability to use techniques and skills needed for reducing stress and depression.   Initial Review & Psychosocial Screening:     Initial Psych Review & Screening - 09/10/17 1259      Initial Review   Current issues with History of Depression;Current Anxiety/Panic;Current Stress Concerns   Source of Stress Concerns Family   Comments [email or web address] he did not have plans to hurt himself. His PHQ9 score was 14.     Family Dynamics   Good Support System? Yes     Barriers   Psychosocial barriers to participate in program The patient should benefit from training in stress management and relaxation.     Screening Interventions   Interventions Encouraged to exercise;Program counselor consult;Yes   Expected Outcomes Short Term goal: Utilizing psychosocial counselor, staff and physician to assist with identification of specific Stressors or current issues interfering with healing process. Setting desired goal for each stressor or current issue identified.;Long Term Goal: Stressors or current issues are controlled or eliminated.;Short Term goal: Identification and review with participant of any Quality of Life or Depression concerns found by scoring the questionnaire.;Long Term goal: The participant improves quality of Life and PHQ9 Scores as seen by post scores and/or verbalization of changes      Quality of Life Scores:      Quality of Life - 09/10/17 1248      Quality of Life Scores   Health/Function Pre 12 %   Socioeconomic Pre 19.57 %   Psych/Spiritual Pre 10.14 %   Family Pre 24 %   GLOBAL Pre 14.94 %       PHQ-9: Recent Review Flowsheet Data    [facility] 09/10/2017 09/10/2017   Decreased Interest - 2   Down, Depressed, Hopeless - 2   PHQ - 2 Score - 4   Altered sleeping - 2   Tired, decreased energy - 3   Change in appetite - 0   Feeling bad or failure about yourself  - 2   Trouble concentrating - 2   Moving slowly or fidgety/restless - 0   Suicidal thoughts (No Data)  1   PHQ-9 Score - 14   Difficult doing work/chores - Somewhat difficult     Interpretation of Total Score  Total Score Depression Severity:  1-4 = Minimal depression, 5-9 = Mild depression, 10-14 = Moderate depression, 15-19 = Moderately severe depression, 20-27 = Severe depression   Psychosocial Evaluation and Intervention:     Psychosocial Evaluation - 09/19/17 1711      Psychosocial Evaluation & Interventions   Interventions Encouraged to exercise with the program and follow exercise prescription;Relaxation education;Stress management education   Comments Counselor met with Mr. MMarinostoday (Hilliard Clark for initial psychosocial evaluation.  He is a 46year old who had his 2nd heart attack this past July and had a stent inserted.  He has a limited support system with his family living in [place] but he lives with a significant other that is a positive  person for him at this time.  Damein has had Graves disease for 6-8 years and reports along with his heart condition it all impacts his energy levels and he reports sleeping too much.  He states his appetite is stable.  Dencil admits to a history of depression and anxiety and has taken various medications for this over time.  However he is no longer on any of these.  He has "given up" drinking and smoking as well and is on a strict diet due to his health; so he declares all of his "comforts" are gone.  Finbar states his mood is stable at this time although his PHQ-9 scores indicate he has moderate depression with a score of "14."  He has returned to work; and has given up  his negative coping strategies.  He is looking for ways to increase his support system and mentioned going back to church in the near future.  Aarib has goals to increase his energy; stamina and strength while in this program.  He hopes to "be better mentally and physically" upon completion.  Counselor will follow with Hilliard Clark re: his mood/ sleep and depressive symptoms over the next few weeks.  Counselor mentioned him possibly seeing a therapist or talking to his Dr. about medication - which he was not in favor of at this time.     Expected Outcomes Judea will benefit from consistent exercise to achieve his stated goals.  The educational and psychoeducational components will be helpful in [place] learning how to manage his disease and learn positive coping strategies.     Continue Psychosocial Services  Follow up required by counselor      Psychosocial Re-Evaluation:   Psychosocial Discharge (Final Psychosocial Re-Evaluation):   Vocational Rehabilitation: Provide vocational rehab assistance to qualifying candidates.   Vocational Rehab Evaluation & Intervention:     Vocational Rehab - 09/10/17 1249      Initial Vocational Rehab Evaluation & Intervention   Assessment shows need for Vocational Rehabilitation No      Education: Education Goals: Education classes will be provided on a variety of topics geared toward better understanding of heart health and risk factor modification. Participant will state understanding/return demonstration of topics presented as noted by education test scores.  Learning Barriers/Preferences:     Learning Barriers/Preferences - 09/10/17 1252      Learning Barriers/Preferences   Learning Barriers None   Learning Preferences Individual Instruction      Education Topics: General Nutrition Guidelines/Fats and Fiber: -Group instruction provided by verbal, written material, models and posters to present the general guidelines for heart healthy nutrition. Gives  an explanation and review of dietary fats and fiber.   Controlling Sodium/Reading Food Labels: -Group verbal and written material supporting the discussion of sodium use in heart healthy nutrition. Review and explanation with models, verbal and written materials for utilization of the food label.   Exercise Physiology & Risk Factors: - Group verbal and written instruction with models to review the exercise physiology of the cardiovascular system and associated critical values. Details cardiovascular disease risk factors and the goals associated with each risk factor.   Aerobic Exercise & Resistance Training: - Gives group verbal and written discussion on the health impact of inactivity. On the components of aerobic and resistive training programs and the benefits of this training and how to safely progress through these programs.   Flexibility, Balance, General Exercise Guidelines: - Provides group verbal and written instruction on the benefits of flexibility and balance training programs.  Provides general exercise guidelines with specific guidelines to those with heart or lung disease. Demonstration and skill practice provided.   Stress Management: - Provides group verbal and written instruction about the health risks of elevated stress, cause of high stress, and healthy ways to reduce stress.   Depression: - Provides group verbal and written instruction on the correlation between heart/lung disease and depressed mood, treatment options, and the stigmas associated with seeking treatment.   Anatomy & Physiology of the Heart: - Group verbal and written instruction and models provide basic cardiac anatomy and physiology, with the coronary electrical and arterial systems. Review of: AMI, Angina, Valve disease, Heart Failure, Cardiac Arrhythmia, Pacemakers, and the ICD.   Cardiac Procedures: - Group verbal and written instruction to review commonly prescribed medications for heart  disease. Reviews the medication, class of the drug, and side effects. Includes the steps to properly store meds and maintain the prescription regimen. (beta blockers and nitrates)   Cardiac Medications I: - Group verbal and written instruction to review commonly prescribed medications for heart disease. Reviews the medication, class of the drug, and side effects. Includes the steps to properly store meds and maintain the prescription regimen.   Cardiac Medications II: -Group verbal and written instruction to review commonly prescribed medications for heart disease. Reviews the medication, class of the drug, and side effects. (all other drug classes)    Go Sex-Intimacy & Heart Disease, Get SMART - Goal Setting: - Group verbal and written instruction through game format to discuss heart disease and the return to sexual intimacy. Provides group verbal and written material to discuss and apply goal setting through the application of the S.M.A.R.T. Method.   Other Matters of the Heart: - Provides group verbal, written materials and models to describe Heart Failure, Angina, Valve Disease, Peripheral Artery Disease, and Diabetes in the realm of heart disease. Includes description of the disease process and treatment options available to the cardiac patient.   Exercise & Equipment Safety: - Individual verbal instruction and demonstration of equipment use and safety with use of the equipment.   Cardiac Rehab from 09/26/2017 in [facility] Cardiac and Pulmonary Rehab  Date  09/10/17  Educator  C. EnterkinRN  Instruction Review Code  3- Needs Reinforcement      Infection Prevention: - Provides verbal and written material to individual with discussion of infection control including proper hand washing and proper equipment cleaning during exercise session.   Cardiac Rehab from 09/26/2017 in [facility] Cardiac and Pulmonary Rehab  Date  09/10/17  Educator  C. EnterkinRN  Instruction Review Code  1- Verbalizes  Understanding      Falls Prevention: - Provides verbal and written material to individual with discussion of falls prevention and safety.   Cardiac Rehab from 09/26/2017 in [facility] Cardiac and Pulmonary Rehab  Date  09/10/17  Educator  C. [place]  Instruction Review Code  1- Verbalizes Understanding      Diabetes: - Individual verbal and written instruction to review signs/symptoms of diabetes, desired ranges of glucose level fasting, after meals and with exercise. Acknowledge that pre and post exercise glucose checks will be done for 3 sessions at entry of program.   Other: -Provides group and verbal instruction on various topics (see comments)    Knowledge Questionnaire Score:     Knowledge Questionnaire Score - 09/10/17 1249      Knowledge Questionnaire Score   Pre Score 27      Core Components/Risk Factors/Patient Goals at Admission:  Personal Goals and Risk Factors at Admission - 09/10/17 1257      Core Components/Risk Factors/Patient Goals on Admission    Weight Management Yes;Weight Loss   Intervention Weight Management: Develop a combined nutrition and exercise program designed to reach desired caloric intake, while maintaining appropriate intake of nutrient and fiber, sodium and fats, and appropriate energy expenditure required for the weight goal.;Weight Management: Provide education and appropriate resources to help participant work on and attain dietary goals.;Weight Management/Obesity: Establish reasonable short term and long term weight goals.;Obesity: Provide education and appropriate resources to help participant work on and attain dietary goals.   Admit Weight 218 lb 3.2 oz (99 kg)   Goal Weight: Short Term 213 lb (96.6 kg)   Goal Weight: Long Term 200 lb (90.7 kg)   Expected Outcomes Short Term: Continue to assess and modify interventions until short term weight is achieved;Weight Loss: Understanding of general recommendations for a balanced deficit meal  plan, which promotes 1-2 lb weight loss per week and includes a negative energy balance of [id number] kcal/d;Understanding recommendations for meals to include 15-35% energy as protein, 25-35% energy from fat, 35-60% energy from carbohydrates, less than 264m of dietary cholesterol, 20-35 gm of total fiber daily;Understanding of distribution of calorie intake throughout the day with the consumption of 4-5 meals/snacks;Long Term: Adherence to nutrition and physical activity/exercise program aimed toward attainment of established weight goal   Hypertension Yes   Intervention Provide education on lifestyle modifcations including regular physical activity/exercise, weight management, moderate sodium restriction and increased consumption of fresh fruit, vegetables, and low fat dairy, alcohol moderation, and smoking cessation.;Monitor prescription use compliance.   Expected Outcomes Short Term: Continued assessment and intervention until BP is < 140/920mHG in hypertensive participants. < 130/8069mG in hypertensive participants with diabetes, heart failure or chronic kidney disease.;Long Term: Maintenance of blood pressure at goal levels.   Lipids Yes   Intervention Provide education and support for participant on nutrition & aerobic/resistive exercise along with prescribed medications to achieve LDL <49m69mDL >40mg37mExpected Outcomes Short Term: Participant states understanding of desired cholesterol values and is compliant with medications prescribed. Participant is following exercise prescription and nutrition guidelines.;Long Term: Cholesterol controlled with medications as prescribed, with individualized exercise RX and with personalized nutrition plan. Value goals: LDL < 49mg,6m > 40 mg.   Stress Yes   Intervention Offer individual and/or small group education and counseling on adjustment to heart disease, stress management and health-related lifestyle change. Teach and support self-help  strategies.;Refer participants experiencing significant psychosocial distress to appropriate mental health specialists for further evaluation and treatment. When possible, include family members and significant others in education/counseling sessions.   Expected Outcomes Short Term: Participant demonstrates changes in health-related behavior, relaxation and other stress management skills, ability to obtain effective social support, and compliance with psychotropic medications if prescribed.;Long Term: Emotional wellbeing is indicated by absence of clinically significant psychosocial distress or social isolation.      Core Components/Risk Factors/Patient Goals Review:    Core Components/Risk Factors/Patient Goals at Discharge (Final Review):    ITP Comments:     ITP Comments    Row Name 09/10/17 1254 09/10/17 1330 10/03/17 0815 10/03/17 0819     ITP Comments ITP Created during Cardiac Rehab Medical Review after Cardiac Rehab informed consent was signed.  1.smokes for stress but I am having no problems giving it up after my last heart attack. I talked with him about counseling and he said I haven't done  that in the past "why tell other people my problems".  He checked Current Anxiety/Panic on our [place]. Totat PHQ9 14. Age 1.   - 30 day review. Continue with ITP unless directed changes per Medical Director Review.        Comments:

## 2017-10-03 NOTE — Progress Notes (Signed)
Erroneous encounter -has not met with the dietician before.

## 2017-10-04 DIAGNOSIS — I2102 ST elevation (STEMI) myocardial infarction involving left anterior descending coronary artery: Secondary | ICD-10-CM

## 2017-10-04 DIAGNOSIS — Z48812 Encounter for surgical aftercare following surgery on the circulatory system: Secondary | ICD-10-CM | POA: Diagnosis not present

## 2017-10-04 NOTE — Progress Notes (Signed)
Daily Session Note  Patient Details  Name: Bobby [name] MRN: [id number] Date of Birth: March 06, 1971 Referring Provider:     Cardiac Rehab from 09/10/2017 in [facility] Cardiac and Pulmonary Rehab  Referring Provider  Adrian Prows MD      Encounter Date: 10/04/2017  Check In:     Session Check In - 10/04/17 1627      Check-In   Location [facility]-Cardiac & Pulmonary Rehab   Staff Present Gerlene Burdock, RN, Vickki Hearing, BA, ACSM CEP, Exercise Physiologist;Amariyana Heacox Flavia Shipper   Supervising physician immediately available to respond to emergencies See telemetry face sheet for immediately available ER MD   Medication changes reported     No   Fall or balance concerns reported    No   Warm-up and Cool-down Performed on first and last piece of equipment   Resistance Training Performed Yes   VAD Patient? No     Pain Assessment   Currently in Pain? No/denies   Multiple Pain Sites No         History  Smoking Status  . Former Smoker  . Packs/day: 0.50  . Types: Cigarettes  . Quit date: 07/13/2017  Smokeless Tobacco  . Never Used    Comment: quit 2012    Goals Met:  Independence with exercise equipment Exercise tolerated well No report of cardiac concerns or symptoms Strength training completed today  Goals Unmet:  Not Applicable  Comments: Pt able to follow exercise prescription today without complaint.  Will continue to monitor for progression.   Dr. Emily Filbert is Medical Director for [place] and [facility].

## 2017-10-08 ENCOUNTER — Encounter: Payer: 59 | Admitting: *Deleted

## 2017-10-08 DIAGNOSIS — I2102 ST elevation (STEMI) myocardial infarction involving left anterior descending coronary artery: Secondary | ICD-10-CM

## 2017-10-08 DIAGNOSIS — Z48812 Encounter for surgical aftercare following surgery on the circulatory system: Secondary | ICD-10-CM | POA: Diagnosis not present

## 2017-10-08 NOTE — Progress Notes (Signed)
Daily Session Note  Patient Details  Name: Angela Platner MRN: [id number] Date of Birth: [birth year] Referring Provider:     Cardiac Rehab from 09/10/2017 in [facility] Cardiac and Pulmonary Rehab  Referring Provider  Adrian Prows MD      Encounter Date: 10/08/2017  Check In:     Session Check In - 10/08/17 1638      Check-In   Location [facility]-Cardiac & Pulmonary Rehab   Staff Present Nada Maclachlan, BA, ACSM CEP, Exercise Physiologist;Seanpatrick Maisano Amedeo Plenty, BS, ACSM CEP, Exercise Physiologist;Meredith Sherryll Burger, RN BSN   Supervising physician immediately available to respond to emergencies See telemetry face sheet for immediately available ER MD   Medication changes reported     No   Fall or balance concerns reported    No   Warm-up and Cool-down Performed on first and last piece of equipment   Resistance Training Performed Yes   VAD Patient? No     Pain Assessment   Currently in Pain? No/denies   Multiple Pain Sites No         History  Smoking Status  . Former Smoker  . Packs/day: 0.50  . Types: Cigarettes  . Quit date: 07/13/2017  Smokeless Tobacco  . Never Used    Comment: quit 2012    Goals Met:  Independence with exercise equipment Exercise tolerated well No report of cardiac concerns or symptoms Strength training completed today  Goals Unmet:  Not Applicable  Comments: Pt able to follow exercise prescription today without complaint.  Will continue to monitor for progression.    Dr. Emily Filbert is Medical Director for [place] and [facility].

## 2017-10-10 DIAGNOSIS — I2102 ST elevation (STEMI) myocardial infarction involving left anterior descending coronary artery: Secondary | ICD-10-CM

## 2017-10-10 DIAGNOSIS — Z48812 Encounter for surgical aftercare following surgery on the circulatory system: Secondary | ICD-10-CM | POA: Diagnosis not present

## 2017-10-10 NOTE — Progress Notes (Signed)
Daily Session Note  Patient Details  Name: Bobby [name] MRN: [id number] Date of Birth: [birth year] Referring Provider:     Cardiac Rehab from 09/10/2017 in [facility] Cardiac and Pulmonary Rehab  Referring Provider  Adrian Prows MD      Encounter Date: 10/10/2017  Check In:      History  Smoking Status  . Former Smoker  . Packs/day: 0.50  . Types: Cigarettes  . Quit date: 07/13/2017  Smokeless Tobacco  . Never Used    Comment: quit 2012    Goals Met:  Independence with exercise equipment Exercise tolerated well No report of cardiac concerns or symptoms Strength training completed today  Goals Unmet:  Not Applicable  Comments: Pt able to follow exercise prescription today without complaint.  Will continue to monitor for progression.    Dr. Emily Filbert is Medical Director for [place] and [facility].

## 2017-10-11 DIAGNOSIS — Z48812 Encounter for surgical aftercare following surgery on the circulatory system: Secondary | ICD-10-CM | POA: Diagnosis not present

## 2017-10-11 DIAGNOSIS — I2102 ST elevation (STEMI) myocardial infarction involving left anterior descending coronary artery: Secondary | ICD-10-CM

## 2017-10-11 NOTE — Progress Notes (Deleted)
Daily Session Note  Patient Details  Name: Bobby [name] MRN: [id number] Date of Birth: 1971/09/14 Referring Provider:     Cardiac Rehab from 09/10/2017 in [facility] Cardiac and Pulmonary Rehab  Referring Provider  Adrian Prows MD      Encounter Date: 10/11/2017  Check In:     Session Check In - 10/11/17 1636      Check-In   Location [facility]-Cardiac & Pulmonary Rehab   Staff Present Gerlene Burdock, RN, Moises Blood, BS, ACSM CEP, Exercise Physiologist;Gusta Marksberry Flavia Shipper   Supervising physician immediately available to respond to emergencies See telemetry face sheet for immediately available ER MD   Medication changes reported     No   Fall or balance concerns reported    No   Warm-up and Cool-down Performed on first and last piece of equipment   Resistance Training Performed Yes   VAD Patient? No     Pain Assessment   Currently in Pain? No/denies   Multiple Pain Sites No           Exercise Prescription Changes - 10/11/17 1600      Home Exercise Plan   Plans to continue exercise at Home (comment)  walk, bike (mountain bike)   Frequency Add 2 additional days to program exercise sessions.   Initial Home Exercises Provided 10/11/17      History  Smoking Status  . Former Smoker  . Packs/day: 0.50  . Types: Cigarettes  . Quit date: 07/13/2017  Smokeless Tobacco  . Never Used    Comment: quit 2012    Goals Met:  Independence with exercise equipment Exercise tolerated well No report of cardiac concerns or symptoms Strength training completed today  Goals Unmet:  Not Applicable  Comments: Pt able to follow exercise prescription today without complaint.  Will continue to monitor for progression.   Dr. Emily Filbert is Medical Director for [place] and [facility].

## 2017-10-11 NOTE — Progress Notes (Signed)
Daily Session Note  Patient Details  Name: Bobby [name] MRN: [id number] Date of Birth: 1971/01/30 Referring Provider:     Cardiac Rehab from 09/10/2017 in [facility] Cardiac and Pulmonary Rehab  Referring Provider  Adrian Prows MD      Encounter Date: 10/11/2017  Check In:        Exercise Prescription Changes - 10/11/17 1600      Home Exercise Plan   Plans to continue exercise at Home (comment)  walk, bike (mountain bike)   Frequency Add 2 additional days to program exercise sessions.   Initial Home Exercises Provided 10/11/17      History  Smoking Status  . Former Smoker  . Packs/day: 0.50  . Types: Cigarettes  . Quit date: 07/13/2017  Smokeless Tobacco  . Never Used    Comment: quit 2012    Goals Met:  Independence with exercise equipment Exercise tolerated well No report of cardiac concerns or symptoms Strength training completed today  Goals Unmet:  Not Applicable  Comments: Reviewed home exercise with pt today.  Pt plans to walk and bike for exercise.  Reviewed THR, pulse, RPE, sign and symptoms, NTG use, and when to call 911 or MD.  Also discussed weather considerations and indoor options.  Pt voiced understanding.   Dr. Emily Filbert is Medical Director for [place] and [facility].

## 2017-10-15 DIAGNOSIS — I2102 ST elevation (STEMI) myocardial infarction involving left anterior descending coronary artery: Secondary | ICD-10-CM

## 2017-10-15 DIAGNOSIS — Z48812 Encounter for surgical aftercare following surgery on the circulatory system: Secondary | ICD-10-CM | POA: Diagnosis not present

## 2017-10-15 NOTE — Progress Notes (Signed)
Daily Session Note  Patient Details  Name: Bobby [name] MRN: [id number] Date of Birth: [birth year] Referring Provider:     Cardiac Rehab from 09/10/2017 in [facility] Cardiac and Pulmonary Rehab  Referring Provider  Adrian Prows MD      Encounter Date: 10/15/2017  Check In:     Session Check In - 10/15/17 1640      Check-In   Location [facility]-Cardiac & Pulmonary Rehab   Staff Present Nada Maclachlan, BA, ACSM CEP, Exercise Physiologist;Kelly Amedeo Plenty, BS, ACSM CEP, Exercise Physiologist;Meredith Sherryll Burger, RN BSN   Supervising physician immediately available to respond to emergencies See telemetry face sheet for immediately available ER MD   Medication changes reported     No   Fall or balance concerns reported    No   Warm-up and Cool-down Performed on first and last piece of equipment   Resistance Training Performed Yes   VAD Patient? No     Pain Assessment   Currently in Pain? No/denies         History  Smoking Status  . Former Smoker  . Packs/day: 0.50  . Types: Cigarettes  . Quit date: 07/13/2017  Smokeless Tobacco  . Never Used    Comment: quit 2012    Goals Met:  Independence with exercise equipment Exercise tolerated well No report of cardiac concerns or symptoms Strength training completed today  Goals Unmet:  Not Applicable  Comments: Pt able to follow exercise prescription today without complaint.  Will continue to monitor for progression.    Dr. Emily Filbert is Medical Director for [place] and [facility].

## 2017-10-17 ENCOUNTER — Encounter: Payer: 59 | Admitting: *Deleted

## 2017-10-17 DIAGNOSIS — I2102 ST elevation (STEMI) myocardial infarction involving left anterior descending coronary artery: Secondary | ICD-10-CM

## 2017-10-17 DIAGNOSIS — Z48812 Encounter for surgical aftercare following surgery on the circulatory system: Secondary | ICD-10-CM | POA: Diagnosis not present

## 2017-10-17 NOTE — Progress Notes (Signed)
Daily Session Note  Patient Details  Name: Bobby [name] MRN: [id number] Date of Birth: 06-18-1971 Referring Provider:     Cardiac Rehab from 09/10/2017 in [facility] Cardiac and Pulmonary Rehab  Referring Provider  Adrian Prows MD      Encounter Date: 10/17/2017  Check In:     Session Check In - 10/17/17 1633      Check-In   Location [facility]-Cardiac & Pulmonary Rehab   Staff Present Renita Papa, RN Vickki Hearing, BA, ACSM CEP, Exercise Physiologist;Carroll Enterkin, RN, BSN   Supervising physician immediately available to respond to emergencies See telemetry face sheet for immediately available ER MD   Medication changes reported     No   Fall or balance concerns reported    No   Warm-up and Cool-down Performed on first and last piece of equipment   Resistance Training Performed Yes   VAD Patient? No     Pain Assessment   Currently in Pain? No/denies         History  Smoking Status  . Former Smoker  . Packs/day: 0.50  . Types: Cigarettes  . Quit date: 07/13/2017  Smokeless Tobacco  . Never Used    Comment: quit 2012    Goals Met:  Independence with exercise equipment Exercise tolerated well No report of cardiac concerns or symptoms Strength training completed today  Goals Unmet:  Not Applicable  Comments: Pt able to follow exercise prescription today without complaint.  Will continue to monitor for progression.    Dr. Emily Filbert is Medical Director for [place] and [facility].

## 2017-10-18 ENCOUNTER — Encounter: Payer: 59 | Admitting: *Deleted

## 2017-10-18 DIAGNOSIS — I2102 ST elevation (STEMI) myocardial infarction involving left anterior descending coronary artery: Secondary | ICD-10-CM

## 2017-10-18 DIAGNOSIS — Z48812 Encounter for surgical aftercare following surgery on the circulatory system: Secondary | ICD-10-CM | POA: Diagnosis not present

## 2017-10-18 NOTE — Progress Notes (Signed)
Daily Session Note  Patient Details  Name: Bobby [name] MRN: [id number] Date of Birth: Aug 11, 1971 Referring Provider:     Cardiac Rehab from 09/10/2017 in [facility] Cardiac and Pulmonary Rehab  Referring Provider  Adrian Prows MD      Encounter Date: 10/18/2017  Check In:     Session Check In - 10/18/17 1655      Check-In   Location [facility]-Cardiac & Pulmonary Rehab   Staff Present Earlean Shawl, BS, ACSM CEP, Exercise Physiologist;Carroll Enterkin, RN, BSN;Joseph [employer] physician immediately available to respond to emergencies See telemetry face sheet for immediately available ER MD   Medication changes reported     No   Fall or balance concerns reported    No   Warm-up and Cool-down Performed on first and last piece of equipment   Resistance Training Performed Yes   VAD Patient? No     Pain Assessment   Currently in Pain? No/denies   Multiple Pain Sites No         History  Smoking Status  . Former Smoker  . Packs/day: 0.50  . Types: Cigarettes  . Quit date: 07/13/2017  Smokeless Tobacco  . Never Used    Comment: quit 2012    Goals Met:  Independence with exercise equipment Exercise tolerated well No report of cardiac concerns or symptoms Strength training completed today  Goals Unmet:  Not Applicable  Comments: Pt able to follow exercise prescription today without complaint.  Will continue to monitor for progression.    Dr. Emily Filbert is Medical Director for [place] and [facility].

## 2017-10-22 ENCOUNTER — Encounter: Payer: 59 | Admitting: *Deleted

## 2017-10-22 DIAGNOSIS — I2102 ST elevation (STEMI) myocardial infarction involving left anterior descending coronary artery: Secondary | ICD-10-CM

## 2017-10-22 DIAGNOSIS — Z48812 Encounter for surgical aftercare following surgery on the circulatory system: Secondary | ICD-10-CM | POA: Diagnosis not present

## 2017-10-22 NOTE — Progress Notes (Signed)
Daily Session Note  Patient Details  Name: [name] MRN: [id number] Date of Birth: 1971/10/31 Referring Provider:     Cardiac Rehab from 09/10/2017 in [facility] Cardiac and Pulmonary Rehab  Referring Provider  Adrian Prows MD      Encounter Date: 10/22/2017  Check In:     Session Check In - 10/22/17 1714      Check-In   Location [facility]-Cardiac & Pulmonary Rehab   Staff Present Nada Maclachlan, BA, ACSM CEP, Exercise Physiologist;Harmonii Karle Amedeo Plenty, BS, ACSM CEP, Exercise Physiologist;Meredith Sherryll Burger, RN BSN   Supervising physician immediately available to respond to emergencies See telemetry face sheet for immediately available ER MD   Medication changes reported     No   Fall or balance concerns reported    No   Warm-up and Cool-down Performed on first and last piece of equipment   Resistance Training Performed Yes   VAD Patient? No     Pain Assessment   Currently in Pain? No/denies   Multiple Pain Sites No         History  Smoking Status  . Former Smoker  . Packs/day: 0.50  . Types: Cigarettes  . Quit date: 07/13/2017  Smokeless Tobacco  . Never Used    Comment: quit 2012    Goals Met:  Independence with exercise equipment Exercise tolerated well No report of cardiac concerns or symptoms Strength training completed today  Goals Unmet:  Not Applicable  Comments: Pt able to follow exercise prescription today without complaint.  Will continue to monitor for progression.    Dr. Emily Filbert is Medical Director for [place] and [facility].

## 2017-10-25 ENCOUNTER — Encounter: Payer: 59 | Attending: Cardiology

## 2017-10-25 DIAGNOSIS — Z48812 Encounter for surgical aftercare following surgery on the circulatory system: Secondary | ICD-10-CM | POA: Diagnosis not present

## 2017-10-25 DIAGNOSIS — Z955 Presence of coronary angioplasty implant and graft: Secondary | ICD-10-CM | POA: Diagnosis present

## 2017-10-25 DIAGNOSIS — I2102 ST elevation (STEMI) myocardial infarction involving left anterior descending coronary artery: Secondary | ICD-10-CM

## 2017-10-25 DIAGNOSIS — I213 ST elevation (STEMI) myocardial infarction of unspecified site: Secondary | ICD-10-CM | POA: Insufficient documentation

## 2017-10-25 NOTE — Progress Notes (Signed)
Daily Session Note  Patient Details  Name: Bobby [name] MRN: [id number] Date of Birth: 1971/12/08 Referring Provider:     Cardiac Rehab from 09/10/2017 in [facility] Cardiac and Pulmonary Rehab  Referring Provider  Adrian Prows MD      Encounter Date: 10/25/2017  Check In:     Session Check In - 10/25/17 1613      Check-In   Location [facility]-Cardiac & Pulmonary Rehab   Staff Present Gerlene Burdock, RN, Moises Blood, BS, ACSM CEP, Exercise Physiologist;Chelbie Jarnagin Flavia Shipper   Supervising physician immediately available to respond to emergencies See telemetry face sheet for immediately available ER MD   Medication changes reported     No   Fall or balance concerns reported    No   Warm-up and Cool-down Performed on first and last piece of equipment   Resistance Training Performed Yes   VAD Patient? No     Pain Assessment   Currently in Pain? No/denies         History  Smoking Status  . Former Smoker  . Packs/day: 0.50  . Types: Cigarettes  . Quit date: 07/13/2017  Smokeless Tobacco  . Never Used    Comment: quit 2012    Goals Met:  Independence with exercise equipment Exercise tolerated well No report of cardiac concerns or symptoms Strength training completed today  Goals Unmet:  Not Applicable  Comments: Pt able to follow exercise prescription today without complaint.  Will continue to monitor for progression.   Dr. Emily Filbert is Medical Director for [place] and [facility].

## 2017-10-29 ENCOUNTER — Telehealth: Payer: Self-pay

## 2017-10-29 NOTE — Telephone Encounter (Signed)
Bobby [name] will not be at heart track today.

## 2017-10-30 ENCOUNTER — Other Ambulatory Visit (HOSPITAL_COMMUNITY): Payer: Self-pay | Admitting: Cardiology

## 2017-10-30 DIAGNOSIS — I2510 Atherosclerotic heart disease of native coronary artery without angina pectoris: Secondary | ICD-10-CM

## 2017-10-31 ENCOUNTER — Encounter: Payer: Self-pay | Admitting: *Deleted

## 2017-10-31 ENCOUNTER — Encounter: Payer: 59 | Admitting: *Deleted

## 2017-10-31 DIAGNOSIS — I2102 ST elevation (STEMI) myocardial infarction involving left anterior descending coronary artery: Secondary | ICD-10-CM

## 2017-10-31 DIAGNOSIS — Z48812 Encounter for surgical aftercare following surgery on the circulatory system: Secondary | ICD-10-CM | POA: Diagnosis not present

## 2017-10-31 NOTE — Progress Notes (Signed)
Cardiac Individual Treatment Plan  Patient Details  Name: Bobby [name] MRN: [id number] Date of Birth: 03/27/1971 Referring Provider:     Cardiac Rehab from 09/10/2017 in [facility] Cardiac and Pulmonary Rehab  Referring Provider  Adrian Prows MD      Initial Encounter Date:    Cardiac Rehab from 09/10/2017 in [facility] Cardiac and Pulmonary Rehab  Date  09/10/17  Referring Provider  Adrian Prows MD      Visit Diagnosis: ST elevation myocardial infarction involving left anterior descending (LAD) coronary artery ([place])  Patient's Home Medications on Admission:  Current Outpatient Medications:  .  aspirin 81 MG tablet, Take 81 mg by mouth daily., Disp: , Rfl:  .  carvedilol (COREG) 3.125 MG tablet, Take 1 tablet (3.125 mg total) by mouth 2 (two) times daily with a meal., Disp: 90 tablet, Rfl: 0 .  Cholecalciferol (VITAMIN D) 2000 units CAPS, Take 2,000 Units by mouth 3 (three) times a week., Disp: , Rfl:  .  levothyroxine (SYNTHROID, LEVOTHROID) 137 MCG tablet, Take 137 mcg by mouth daily before breakfast., Disp: , Rfl:  .  Multiple Vitamin (MULTIVITAMIN WITH MINERALS) TABS tablet, Take 1 tablet by mouth daily., Disp: , Rfl:  .  nitroGLYCERIN (NITROSTAT) 0.4 MG SL tablet, Place 1 tablet (0.4 mg total) under the tongue every 5 (five) minutes as needed for chest pain., Disp: 25 tablet, Rfl: 3 .  rosuvastatin (CRESTOR) 20 MG tablet, Take 1 tablet (20 mg total) by mouth daily. (Patient taking differently: Take 20 mg by mouth every evening. ), Disp: 90 tablet, Rfl: 0 .  sacubitril-valsartan (ENTRESTO) 24-26 MG, Take 1 tablet by mouth 2 (two) times daily., Disp: , Rfl:  .  ticagrelor (BRILINTA) 90 MG TABS tablet, Take 1 tablet (90 mg total) by mouth 2 (two) times daily., Disp: 60 tablet, Rfl: 0  Past Medical History: Past Medical History:  Diagnosis Date  . Graves disease   . Hypertension   . MI (myocardial infarction) ([place])   . Thyroid disease    thyroid irradiated per pt    Tobacco Use: Social  History   Tobacco Use  Smoking Status Former Smoker  . Packs/day: 0.50  . Types: Cigarettes  . Last attempt to quit: 07/13/2017  . Years since quitting: 0.3  Smokeless Tobacco Never Used  Tobacco Comment   quit 2012    Labs: Recent Review Flowsheet Data    Labs for ITP Cardiac and Pulmonary Rehab Latest Ref Rng & Units 03/15/2012 07/13/2017 07/14/2017   Cholestrol 0 - 200 mg/dL - - 130   LDLCALC 0 - 99 mg/dL - - 67   HDL >40 mg/dL - - 37(L)   Trlycerides <150 mg/dL - - 129   Hemoglobin A1c 4.8 - 5.6 % - 6.0(H) -   TCO2 0 - 100 mmol/L 27 - -   O2SAT % - 65.8 -       Exercise Target Goals:    Exercise Program Goal: Individual exercise prescription set with THRR, safety & activity barriers. Participant demonstrates ability to understand and report RPE using BORG scale, to self-measure pulse accurately, and to acknowledge the importance of the exercise prescription.  Exercise Prescription Goal: Starting with aerobic activity 30 plus minutes a day, 3 days per week for initial exercise prescription. Provide home exercise prescription and guidelines that participant acknowledges understanding prior to discharge.  Activity Barriers & Risk Stratification: Activity Barriers & Cardiac Risk Stratification - 09/10/17 1255      Activity Barriers & Cardiac Risk Stratification  Activity Barriers  Chest Pain/Angina Hx of throat tightness   Hx of throat tightness   Cardiac Risk Stratification  High       6 Minute Walk: 6 Minute Walk    Row Name 09/10/17 1445         6 Minute Walk   Phase  Initial     Distance  1333 feet     Walk Time  6 minutes     # of Rest Breaks  0     MPH  2.52     METS  4.32     RPE  7     VO2 Peak  15.11     Symptoms  No     Resting HR  89 bpm     Resting BP  146/74     Resting Oxygen Saturation   99 %     Exercise Oxygen Saturation  during 6 min walk  98 %     Max Ex. HR  115 bpm     Max Ex. BP  124/70     2 Minute Post BP  116/62         Oxygen Initial Assessment: Oxygen Initial Assessment - 10/05/17 0925      Home Oxygen   Home Oxygen Device  None    Home Exercise Oxygen Prescription  None    Home at Rest Exercise Oxygen Prescription  None      Initial 6 min Walk   Oxygen Used  None      Program Oxygen Prescription   Program Oxygen Prescription  None       Oxygen Re-Evaluation:   Oxygen Discharge (Final Oxygen Re-Evaluation):   Initial Exercise Prescription: Initial Exercise Prescription - 09/10/17 1400      Date of Initial Exercise RX and Referring Provider   Date  09/10/17    Referring Provider  Adrian Prows MD      Treadmill   MPH  3    Grade  2.5    Minutes  15    METs  4.33      Recumbant Elliptical   Level  3    RPM  50    Minutes  15    METs  3.5      Elliptical   Level  2    Speed  4.3    Minutes  15      Prescription Details   Frequency (times per week)  3    Duration  Progress to 45 minutes of aerobic exercise without signs/symptoms of physical distress      Intensity   THRR 40-80% of Max Heartrate  123-156    Ratings of Perceived Exertion  11-13    Perceived Dyspnea  0-4      Progression   Progression  Continue to progress workloads to maintain intensity without signs/symptoms of physical distress.      Resistance Training   Training Prescription  Yes    Weight  5 lbs       Perform Capillary Blood Glucose checks as needed.  Exercise Prescription Changes: Exercise Prescription Changes    Row Name 09/10/17 1400 09/28/17 0900 10/11/17 1600 10/12/17 1200 10/24/17 1200     Response to Exercise   Blood Pressure (Admit)  146/74  112/82  -  112/64  98/68   Blood Pressure (Exercise)  124/70  142/66  -  140/68  162/70   Blood Pressure (Exit)  116/62  120/76  -  96/56  110/68  Heart Rate (Admit)  89 bpm  93 bpm  -  79 bpm  76 bpm   Heart Rate (Exercise)  115 bpm  150 bpm  -  150 bpm  151 bpm   Heart Rate (Exit)  83 bpm  122 bpm  -  101 bpm  91 bpm   Oxygen Saturation  (Admit)  99 %  -  -  -  -   Oxygen Saturation (Exercise)  98 %  -  -  -  -   Rating of Perceived Exertion (Exercise)  7  13  -  14  14   Symptoms  none  none  -  none  none   Comments  walk test results  -  -  -  -   Duration  Progress to 45 minutes of aerobic exercise without signs/symptoms of physical distress  Progress to 45 minutes of aerobic exercise without signs/symptoms of physical distress  -  Continue with 45 min of aerobic exercise without signs/symptoms of physical distress.  Continue with 45 min of aerobic exercise without signs/symptoms of physical distress.   Intensity  THRR unchanged  THRR unchanged  -  THRR unchanged  THRR unchanged     Progression   Progression  -  -  -  Continue to progress workloads to maintain intensity without signs/symptoms of physical distress.  Continue to progress workloads to maintain intensity without signs/symptoms of physical distress.   Average METs  -  -  -  5.88  7.3     Resistance Training   Training Prescription  -  Yes  -  Yes  Yes   Weight  -  5 lb  -  5 lb  5 lb   Reps  -  10-15  -  10-15  10-15     Interval Training   Interval Training  -  No  -  -  No     Treadmill   MPH  -  3  -  3  -   Grade  -  2.5  -  2.5  -   Minutes  -  15  -  15  -   METs  -  4.33  -  4.54  -     Recumbant Elliptical   Level  -  3  -  3  5   RPM  -  50  -  50  50   Minutes  -  15  -  15  15   METs  -  4  -  7.2  7.3     Elliptical   Level  -  2  -  2  2   Speed  -  4.3  -  4.3  4.3   Minutes  -  15  -  15  15     Home Exercise Plan   Plans to continue exercise at  -  -  Home (comment) walk, bike (mountain bike)  -  Home (comment) walk, bike (mountain bike)   Frequency  -  -  Add 2 additional days to program exercise sessions.  -  Add 2 additional days to program exercise sessions.   Initial Home Exercises Provided  -  -  10/11/17  -  10/11/17      Exercise Comments: Exercise Comments    Row Name 09/19/17 1703 10/11/17 1628          Exercise Comments  First full day of  exercise!  Patient was oriented to gym and equipment including functions, settings, policies, and procedures.  Patient's individual exercise prescription and treatment plan were reviewed.  All starting workloads were established based on the results of the 6 minute walk test done at initial orientation visit.  The plan for exercise progression was also introduced and progression will be customized based on patient's performance and goals.   Reviewed home exercise with pt today.  Pt plans to walk and bike for exercise.  Reviewed THR, pulse, RPE, sign and symptoms, NTG use, and when to call 911 or MD.  Also discussed weather considerations and indoor options.  Pt voiced understanding.         Exercise Goals and Review: Exercise Goals    Row Name 09/10/17 1453             Exercise Goals   Increase Physical Activity  Yes       Intervention  Provide advice, education, support and counseling about physical activity/exercise needs.;Develop an individualized exercise prescription for aerobic and resistive training based on initial evaluation findings, risk stratification, comorbidities and participant's personal goals.       Expected Outcomes  Achievement of increased cardiorespiratory fitness and enhanced flexibility, muscular endurance and strength shown through measurements of functional capacity and personal statement of participant.       Increase Strength and Stamina  Yes       Intervention  Provide advice, education, support and counseling about physical activity/exercise needs.;Develop an individualized exercise prescription for aerobic and resistive training based on initial evaluation findings, risk stratification, comorbidities and participant's personal goals.       Expected Outcomes  Achievement of increased cardiorespiratory fitness and enhanced flexibility, muscular endurance and strength shown through measurements of functional capacity and personal  statement of participant.       Able to understand and use rate of perceived exertion (RPE) scale  Yes       Intervention  Provide education and explanation on how to use RPE scale       Expected Outcomes  Short Term: Able to use RPE daily in rehab to express subjective intensity level;Long Term:  Able to use RPE to guide intensity level when exercising independently       Knowledge and understanding of Target Heart Rate Range (THRR)  Yes       Intervention  Provide education and explanation of THRR including how the numbers were predicted and where they are located for reference       Expected Outcomes  Short Term: Able to state/look up THRR;Long Term: Able to use THRR to govern intensity when exercising independently;Short Term: Able to use daily as guideline for intensity in rehab       Able to check pulse independently  Yes       Intervention  Provide education and demonstration on how to check pulse in carotid and radial arteries.;Review the importance of being able to check your own pulse for safety during independent exercise       Expected Outcomes  Short Term: Able to explain why pulse checking is important during independent exercise;Long Term: Able to check pulse independently and accurately       Understanding of Exercise Prescription  Yes       Intervention  Provide education, explanation, and written materials on patient's individual exercise prescription       Expected Outcomes  Long Term: Able to explain home exercise prescription to exercise independently;Short Term: Able to explain program  exercise prescription          Exercise Goals Re-Evaluation : Exercise Goals Re-Evaluation    Row Name 09/28/17 0951 10/24/17 1249           Exercise Goal Re-Evaluation   Exercise Goals Review  Increase Physical Activity;Increase Strength and Stamina;Able to understand and use rate of perceived exertion (RPE) scale  Increase Physical Activity;Increase Strength and Stamina      Comments   Schylar is tolerating exercise well.  Staff will contiue to monitor for progress.  Hughes continues to progress well with exercise.      Expected Outcomes  Short - Yosmar will attend HT regularly.  Long - Filiberto will develop healhier lifestyle habits.  Short - add interval training to program.  Long - Maintain fitness gains         Discharge Exercise Prescription (Final Exercise Prescription Changes): Exercise Prescription Changes - 10/24/17 1200      Response to Exercise   Blood Pressure (Admit)  98/68    Blood Pressure (Exercise)  162/70    Blood Pressure (Exit)  110/68    Heart Rate (Admit)  76 bpm    Heart Rate (Exercise)  151 bpm    Heart Rate (Exit)  91 bpm    Rating of Perceived Exertion (Exercise)  14    Symptoms  none    Duration  Continue with 45 min of aerobic exercise without signs/symptoms of physical distress.    Intensity  THRR unchanged      Progression   Progression  Continue to progress workloads to maintain intensity without signs/symptoms of physical distress.    Average METs  7.3      Resistance Training   Training Prescription  Yes    Weight  5 lb    Reps  10-15      Interval Training   Interval Training  No      Recumbant Elliptical   Level  5    RPM  50    Minutes  15    METs  7.3      Elliptical   Level  2    Speed  4.3    Minutes  15      Home Exercise Plan   Plans to continue exercise at  Home (comment) walk, bike (mountain bike)   walk, bike (mountain bike)   Frequency  Add 2 additional days to program exercise sessions.    Initial Home Exercises Provided  10/11/17       Nutrition:  Target Goals: Understanding of nutrition guidelines, daily intake of sodium <1530m, cholesterol <206m calories 30% from fat and 7% or less from saturated fats, daily to have 5 or more servings of fruits and vegetables.  Biometrics: Pre Biometrics - 09/10/17 1454      Pre Biometrics   Height  6' 1.25" (1.861 m)    Weight  218 lb 3.2 oz (99 kg)    Waist  Circumference  40 inches    Hip Circumference  39.75 inches    Waist to Hip Ratio  1.01 %    BMI (Calculated)  28.58    Single Leg Stand  30 seconds        Nutrition Therapy Plan and Nutrition Goals: Nutrition Therapy & Goals - 10/11/17 1629      Nutrition Therapy   RD appointment defered  Yes does not want to meet with RD   does not want to meet with RD      Nutrition  Discharge: Rate Your Plate Scores: Nutrition Assessments - 09/10/17 1247      MEDFICTS Scores   Pre Score  32       Nutrition Goals Re-Evaluation:   Nutrition Goals Discharge (Final Nutrition Goals Re-Evaluation):   Psychosocial: Target Goals: Acknowledge presence or absence of significant depression and/or stress, maximize coping skills, provide positive support system. Participant is able to verbalize types and ability to use techniques and skills needed for reducing stress and depression.   Initial Review & Psychosocial Screening: Initial Psych Review & Screening - 09/10/17 1259      Initial Review   Current issues with  History of Depression;Current Anxiety/Panic;Current Stress Concerns    Source of Stress Concerns  Family    Comments  Keaton said he did not have plans to hurt himself. His PHQ9 score was 14.      Family Dynamics   Good Support System?  Yes      Barriers   Psychosocial barriers to participate in program  The patient should benefit from training in stress management and relaxation.      Screening Interventions   Interventions  Encouraged to exercise;Program counselor consult;Yes    Expected Outcomes  Short Term goal: Utilizing psychosocial counselor, staff and physician to assist with identification of specific Stressors or current issues interfering with healing process. Setting desired goal for each stressor or current issue identified.;Long Term Goal: Stressors or current issues are controlled or eliminated.;Short Term goal: Identification and review with participant of any Quality  of Life or Depression concerns found by scoring the questionnaire.;Long Term goal: The participant improves quality of Life and PHQ9 Scores as seen by post scores and/or verbalization of changes       Quality of Life Scores:  Quality of Life - 09/10/17 1248      Quality of Life Scores   Health/Function Pre  12 %    Socioeconomic Pre  19.57 %    Psych/Spiritual Pre  10.14 %    Family Pre  24 %    GLOBAL Pre  14.94 %       PHQ-9: Recent Review Flowsheet Data    Depression screen [facility] 2/9 10/15/2017 09/10/2017 09/10/2017   Decreased Interest 1  - 2   Down, Depressed, Hopeless 0 - 2   PHQ - 2 Score 1 - 4   Altered sleeping 0 - 2   Tired, decreased energy 1 - 3   Change in appetite 0 - 0   Feeling bad or failure about yourself  0 - 2   Trouble concentrating 1 - 2   Moving slowly or fidgety/restless 0 - 0   Suicidal thoughts 0 (No Data)  1   PHQ-9 Score 3 - 14   Difficult doing work/chores - - Somewhat difficult     Interpretation of Total Score  Total Score Depression Severity:  1-4 = Minimal depression, 5-9 = Mild depression, 10-14 = Moderate depression, 15-19 = Moderately severe depression, 20-27 = Severe depression   Psychosocial Evaluation and Intervention: Psychosocial Evaluation - 09/19/17 1711      Psychosocial Evaluation & Interventions   Interventions  Encouraged to exercise with the program and follow exercise prescription;Relaxation education;Stress management education    Comments  Counselor met with Mr. Star today Hilliard Clark) for initial psychosocial evaluation.  He is a 46 year old who had his 2nd heart attack this past July and had a stent inserted.  He has a limited support system with his family living in [place], but  he lives with a significant other that is a positive person for him at this time.  Izaya has had Graves disease for 6-8 years and reports along with his heart condition it all impacts his energy levels and he reports sleeping too much.  He states his appetite  is stable.  Stewart admits to a history of depression and anxiety and has taken various medications for this over time.  However he is no longer on any of these.  He has "given up" drinking and smoking as well and is on a strict diet due to his health; so he declares all of his "comforts" are gone.  Lazlo states his mood is stable at this time although his PHQ-9 scores indicate he has moderate depression with a score of "14."  He has returned to work; and has given up his negative coping strategies.  He is looking for ways to increase his support system and mentioned going back to church in the near future.  Vahan has goals to increase his energy; stamina and strength while in this program.  He hopes to "be better mentally and physically" upon completion.  Counselor will follow with Hilliard Clark re: his mood/ sleep and depressive symptoms over the next few weeks.  Counselor mentioned him possibly seeing a therapist or talking to his Dr. about medication - which he was not in favor of at this time.      Expected Outcomes  Mare will benefit from consistent exercise to achieve his stated goals.  The educational and psychoeducational components will be helpful in [place] learning how to manage his disease and learn positive coping strategies.      Continue Psychosocial Services   Follow up required by counselor       Psychosocial Re-Evaluation: Psychosocial Re-Evaluation    [place] Name 10/11/17 1631             Psychosocial Re-Evaluation   Comments  Avante still has stress at work.  He states exercise has helped some.  Staff reviewed deep breathing to assisst with stress management.       Expected Outcomes  Short - Yasuo will continue to exercise and will add breathing techniques to manage stress.  Long - Brack will be able to manage stress with exercise and breathing techniques.          Psychosocial Discharge (Final Psychosocial Re-Evaluation): Psychosocial Re-Evaluation - 10/11/17 1631      Psychosocial Re-Evaluation    Comments  Bastion still has stress at work.  He states exercise has helped some.  Staff reviewed deep breathing to assisst with stress management.    Expected Outcomes  Short - Kaidan will continue to exercise and will add breathing techniques to manage stress.  Long - Kiree will be able to manage stress with exercise and breathing techniques.       Vocational Rehabilitation: Provide vocational rehab assistance to qualifying candidates.   Vocational Rehab Evaluation & Intervention: Vocational Rehab - 09/10/17 1249      Initial Vocational Rehab Evaluation & Intervention   Assessment shows need for Vocational Rehabilitation  No       Education: Education Goals: Education classes will be provided on a variety of topics geared toward better understanding of heart health and risk factor modification. Participant will state understanding/return demonstration of topics presented as noted by education test scores.  Learning Barriers/Preferences: Learning Barriers/Preferences - 09/10/17 1252      Learning Barriers/Preferences   Learning Barriers  None    Learning Preferences  Individual Instruction       Education Topics: General Nutrition Guidelines/Fats and Fiber: -Group instruction provided by verbal, written material, models and posters to present the general guidelines for heart healthy nutrition. Gives an explanation and review of dietary fats and fiber.   Cardiac Rehab from 10/22/2017 in [facility] Cardiac and Pulmonary Rehab  Date  10/08/17  Educator  PI  Instruction Review Code  1- Verbalizes Understanding      Controlling Sodium/Reading Food Labels: -Group verbal and written material supporting the discussion of sodium use in heart healthy nutrition. Review and explanation with models, verbal and written materials for utilization of the food label.   Cardiac Rehab from 10/22/2017 in [facility] Cardiac and Pulmonary Rehab  Date  10/15/17  Educator  PI  Instruction Review Code  1-  Verbalizes Understanding      Exercise Physiology & Risk Factors: - Group verbal and written instruction with models to review the exercise physiology of the cardiovascular system and associated critical values. Details cardiovascular disease risk factors and the goals associated with each risk factor.   Cardiac Rehab from 10/22/2017 in [facility] Cardiac and Pulmonary Rehab  Date  10/22/17  Educator  [facility]  Instruction Review Code  1- Verbalizes Understanding      Aerobic Exercise & Resistance Training: - Gives group verbal and written discussion on the health impact of inactivity. On the components of aerobic and resistive training programs and the benefits of this training and how to safely progress through these programs.   Flexibility, Balance, General Exercise Guidelines: - Provides group verbal and written instruction on the benefits of flexibility and balance training programs. Provides general exercise guidelines with specific guidelines to those with heart or lung disease. Demonstration and skill practice provided.   Stress Management: - Provides group verbal and written instruction about the health risks of elevated stress, cause of high stress, and healthy ways to reduce stress.   Depression: - Provides group verbal and written instruction on the correlation between heart/lung disease and depressed mood, treatment options, and the stigmas associated with seeking treatment.   Anatomy & Physiology of the Heart: - Group verbal and written instruction and models provide basic cardiac anatomy and physiology, with the coronary electrical and arterial systems. Review of: AMI, Angina, Valve disease, Heart Failure, Cardiac Arrhythmia, Pacemakers, and the ICD.   Cardiac Procedures: - Group verbal and written instruction to review commonly prescribed medications for heart disease. Reviews the medication, class of the drug, and side effects. Includes the steps to properly store meds and  maintain the prescription regimen. (beta blockers and nitrates)   Cardiac Medications I: - Group verbal and written instruction to review commonly prescribed medications for heart disease. Reviews the medication, class of the drug, and side effects. Includes the steps to properly store meds and maintain the prescription regimen.   Cardiac Medications II: -Group verbal and written instruction to review commonly prescribed medications for heart disease. Reviews the medication, class of the drug, and side effects. (all other drug classes)    Go Sex-Intimacy & Heart Disease, Get SMART - Goal Setting: - Group verbal and written instruction through game format to discuss heart disease and the return to sexual intimacy. Provides group verbal and written material to discuss and apply goal setting through the application of the S.M.A.R.T. Method.   Other Matters of the Heart: - Provides group verbal, written materials and models to describe Heart Failure, Angina, Valve Disease, Peripheral Artery Disease, and Diabetes in the realm of heart  disease. Includes description of the disease process and treatment options available to the cardiac patient.   Exercise & Equipment Safety: - Individual verbal instruction and demonstration of equipment use and safety with use of the equipment.   Cardiac Rehab from 10/22/2017 in [facility] Cardiac and Pulmonary Rehab  Date  09/10/17  Educator  C. EnterkinRN  Instruction Review Code  3- Needs Reinforcement      Infection Prevention: - Provides verbal and written material to individual with discussion of infection control including proper hand washing and proper equipment cleaning during exercise session.   Cardiac Rehab from 10/22/2017 in [facility] Cardiac and Pulmonary Rehab  Date  09/10/17  Educator  C. EnterkinRN  Instruction Review Code  1- Verbalizes Understanding      Falls Prevention: - Provides verbal and written material to individual with discussion of  falls prevention and safety.   Cardiac Rehab from 10/22/2017 in [facility] Cardiac and Pulmonary Rehab  Date  09/10/17  Educator  C. [place] [facility]  Instruction Review Code  1- Verbalizes Understanding      Diabetes: - Individual verbal and written instruction to review signs/symptoms of diabetes, desired ranges of glucose level fasting, after meals and with exercise. Acknowledge that pre and post exercise glucose checks will be done for 3 sessions at entry of program.   Other: -Provides group and verbal instruction on various topics (see comments)    Knowledge Questionnaire Score: Knowledge Questionnaire Score - 09/10/17 1249      Knowledge Questionnaire Score   Pre Score  27       Core Components/Risk Factors/Patient Goals at Admission: Personal Goals and Risk Factors at Admission - 09/10/17 1257      Core Components/Risk Factors/Patient Goals on Admission    Weight Management  Yes;Weight Loss    Intervention  Weight Management: Develop a combined nutrition and exercise program designed to reach desired caloric intake, while maintaining appropriate intake of nutrient and fiber, sodium and fats, and appropriate energy expenditure required for the weight goal.;Weight Management: Provide education and appropriate resources to help participant work on and attain dietary goals.;Weight Management/Obesity: Establish reasonable short term and long term weight goals.;Obesity: Provide education and appropriate resources to help participant work on and attain dietary goals.    Admit Weight  218 lb 3.2 oz (99 kg)    Goal Weight: Short Term  213 lb (96.6 kg)    Goal Weight: Long Term  200 lb (90.7 kg)    Expected Outcomes  Short Term: Continue to assess and modify interventions until short term weight is achieved;Weight Loss: Understanding of general recommendations for a balanced deficit meal plan, which promotes 1-2 lb weight loss per week and includes a negative energy balance of [phone]  kcal/d;Understanding recommendations for meals to include 15-35% energy as protein, 25-35% energy from fat, 35-60% energy from carbohydrates, less than 237m of dietary cholesterol, 20-35 gm of total fiber daily;Understanding of distribution of calorie intake throughout the day with the consumption of 4-5 meals/snacks;Long Term: Adherence to nutrition and physical activity/exercise program aimed toward attainment of established weight goal    Hypertension  Yes    Intervention  Provide education on lifestyle modifcations including regular physical activity/exercise, weight management, moderate sodium restriction and increased consumption of fresh fruit, vegetables, and low fat dairy, alcohol moderation, and smoking cessation.;Monitor prescription use compliance.    Expected Outcomes  Short Term: Continued assessment and intervention until BP is < 140/934mHG in hypertensive participants. < 130/8015mG in hypertensive participants with diabetes, heart  failure or chronic kidney disease.;Long Term: Maintenance of blood pressure at goal levels.    Lipids  Yes    Intervention  Provide education and support for participant on nutrition & aerobic/resistive exercise along with prescribed medications to achieve LDL <62m, HDL >42m    Expected Outcomes  Short Term: Participant states understanding of desired cholesterol values and is compliant with medications prescribed. Participant is following exercise prescription and nutrition guidelines.;Long Term: Cholesterol controlled with medications as prescribed, with individualized exercise RX and with personalized nutrition plan. Value goals: LDL < 7055mHDL > 40 mg.    Stress  Yes    Intervention  Offer individual and/or small group education and counseling on adjustment to heart disease, stress management and health-related lifestyle change. Teach and support self-help strategies.;Refer participants experiencing significant psychosocial distress to appropriate mental  health specialists for further evaluation and treatment. When possible, include family members and significant others in education/counseling sessions.    Expected Outcomes  Short Term: Participant demonstrates changes in health-related behavior, relaxation and other stress management skills, ability to obtain effective social support, and compliance with psychotropic medications if prescribed.;Long Term: Emotional wellbeing is indicated by absence of clinically significant psychosocial distress or social isolation.       Core Components/Risk Factors/Patient Goals Review:  Goals and Risk Factor Review    Row Name 10/11/17 1629             Core Components/Risk Factors/Patient Goals Review   Personal Goals Review  Stress       Review  SeaAllieates his stress is still up at work.  He is using exrecise to help.  Staff reviewed realxation breathing as another tool to help.       Expected Outcomes  Short - SeaDaironll continue to exercise and will add deep breathing to help with stress.  Long - SeaMakotoll manage his stress with exercise and breathing techniques.          Core Components/Risk Factors/Patient Goals at Discharge (Final Review):  Goals and Risk Factor Review - 10/11/17 1629      Core Components/Risk Factors/Patient Goals Review   Personal Goals Review  Stress    Review  SeaRyleates his stress is still up at work.  He is using exrecise to help.  Staff reviewed realxation breathing as another tool to help.    Expected Outcomes  Short - SeaCalistroll continue to exercise and will add deep breathing to help with stress.  Long - SeaBartowll manage his stress with exercise and breathing techniques.       ITP Comments: ITP Comments    Row Name 09/10/17 1254 09/10/17 1330 10/03/17 0815 10/03/17 0819 10/31/17 0607   ITP Comments  ITP Created during Cardiac Rehab Medical Review after Cardiac Rehab informed consent was signed.   1.smokes for stress but I am having no problems giving it up after my  last heart attack. I talked with him about counseling and he said I haven't done that in the past "why tell other people my problems".  He checked Current Anxiety/Panic on our [place] PHQ9 14. Age [age]  -  30 day review. Continue with ITP unless directed changes per Medical Director Review.   30 day review. Continue with ITP unless directed changes per Medical Director review.       Comments:

## 2017-10-31 NOTE — Progress Notes (Signed)
Daily Session Note  Patient Details  Name: Bobby [name] MRN: [id number] Date of Birth: [birth year] Referring Provider:     Cardiac Rehab from 09/10/2017 in [facility] Cardiac and Pulmonary Rehab  Referring Provider  Adrian Prows MD      Encounter Date: 10/31/2017  Check In: Session Check In - 10/31/17 1716      Check-In   Location  [facility]-Cardiac & Pulmonary Rehab    Staff Present  Renita Papa, RN Vickki Hearing, BA, ACSM CEP, Exercise Physiologist;Carroll Enterkin, RN, BSN    Supervising physician immediately available to respond to emergencies  See telemetry face sheet for immediately available ER MD    Medication changes reported      No    Fall or balance concerns reported     No    Warm-up and Cool-down  Performed on first and last piece of equipment    Resistance Training Performed  Yes      Pain Assessment   Currently in Pain?  No/denies    Multiple Pain Sites  No          Social History   Tobacco Use  Smoking Status Former Smoker  . Packs/day: 0.50  . Types: Cigarettes  . Last attempt to quit: 07/13/2017  . Years since quitting: 0.3  Smokeless Tobacco Never Used  Tobacco Comment   quit 2012    Goals Met:  Independence with exercise equipment Exercise tolerated well Strength training completed today  Goals Unmet:  Not Applicable  Comments: Felt dizzy and lightheaded during resistance work.  Rested and had water and felt better.   Dr. Emily Filbert is Medical Director for [place] and [facility].

## 2017-11-01 DIAGNOSIS — Z48812 Encounter for surgical aftercare following surgery on the circulatory system: Secondary | ICD-10-CM | POA: Diagnosis not present

## 2017-11-01 DIAGNOSIS — I2102 ST elevation (STEMI) myocardial infarction involving left anterior descending coronary artery: Secondary | ICD-10-CM

## 2017-11-01 NOTE — Progress Notes (Signed)
Daily Session Note  Patient Details  Name: Bobby [name] MRN: [id number] Date of Birth: 07-31-1971 Referring Provider:     Cardiac Rehab from 09/10/2017 in [facility] Cardiac and Pulmonary Rehab  Referring Provider  Adrian Prows MD      Encounter Date: 11/01/2017  Check In: Session Check In - 11/01/17 1625      Check-In   Location  [facility]-Cardiac & Pulmonary Rehab    Staff Present  Justin Mend RCP,RRT,BSRT;Carroll Enterkin, RN, BSN    Supervising physician immediately available to respond to emergencies  See telemetry face sheet for immediately available ER MD    Medication changes reported      No    Fall or balance concerns reported     No    Warm-up and Cool-down  Performed on first and last piece of equipment    Resistance Training Performed  Yes    VAD Patient?  No      Pain Assessment   Currently in Pain?  No/denies          Social History   Tobacco Use  Smoking Status Former Smoker  . Packs/day: 0.50  . Types: Cigarettes  . Last attempt to quit: 07/13/2017  . Years since quitting: 0.3  Smokeless Tobacco Never Used  Tobacco Comment   quit 2012    Goals Met:  Independence with exercise equipment Exercise tolerated well No report of cardiac concerns or symptoms Strength training completed today  Goals Unmet:  Not Applicable  Comments: Pt able to follow exercise prescription today without complaint.  Will continue to monitor for progression.   Dr. Emily Filbert is Medical Director for [place] and [facility].

## 2017-11-05 ENCOUNTER — Encounter: Payer: 59 | Admitting: *Deleted

## 2017-11-05 DIAGNOSIS — Z48812 Encounter for surgical aftercare following surgery on the circulatory system: Secondary | ICD-10-CM | POA: Diagnosis not present

## 2017-11-05 DIAGNOSIS — I2102 ST elevation (STEMI) myocardial infarction involving left anterior descending coronary artery: Secondary | ICD-10-CM

## 2017-11-05 NOTE — Progress Notes (Signed)
Daily Session Note  Patient Details  Name: Bobby [name] MRN: [id number] Date of Birth: 10/12/1971 Referring Provider:     Cardiac Rehab from 09/10/2017 in [facility] Cardiac and Pulmonary Rehab  Referring Provider  Ganji, Jay MD      Encounter Date: 11/05/2017  Check In: Session Check In - 11/05/17 1714      Check-In   Location  [facility]-Cardiac & Pulmonary Rehab    Staff Present  Meredith Craven, RN BSN; , BS, ACSM CEP, Exercise Physiologist;Amanda Sommer, BA, ACSM CEP, Exercise Physiologist    Supervising physician immediately available to respond to emergencies  See telemetry face sheet for immediately available ER MD    Medication changes reported      No    Fall or balance concerns reported     No    Warm-up and Cool-down  Performed on first and last piece of equipment    Resistance Training Performed  Yes    VAD Patient?  No      Pain Assessment   Currently in Pain?  No/denies    Multiple Pain Sites  No          Social History   Tobacco Use  Smoking Status Former Smoker  . Packs/day: 0.50  . Types: Cigarettes  . Last attempt to quit: 07/13/2017  . Years since quitting: 0.3  Smokeless Tobacco Never Used  Tobacco Comment   quit 2012    Goals Met:  Independence with exercise equipment Exercise tolerated well No report of cardiac concerns or symptoms Strength training completed today  Goals Unmet:  Not Applicable  Comments: Pt able to follow exercise prescription today without complaint.  Will continue to monitor for progression.    Dr. Mark Desrosiers is Medical Director for [facility] and [facility]. 

## 2017-11-07 ENCOUNTER — Ambulatory Visit (HOSPITAL_COMMUNITY)
Admission: RE | Admit: 2017-11-07 | Discharge: 2017-11-07 | Disposition: A | Discharge status: Home / Self Care | Payer: 59 | Source: Ambulatory Visit | Attending: Cardiology | Admitting: Cardiology

## 2017-11-07 DIAGNOSIS — I081 Rheumatic disorders of both mitral and tricuspid valves: Secondary | ICD-10-CM | POA: Diagnosis not present

## 2017-11-07 DIAGNOSIS — I255 Ischemic cardiomyopathy: Secondary | ICD-10-CM | POA: Diagnosis not present

## 2017-11-07 DIAGNOSIS — I2510 Atherosclerotic heart disease of native coronary artery without angina pectoris: Secondary | ICD-10-CM

## 2017-11-07 LAB — POCT I-STAT CREATININE: Creatinine, Ser: 1.1 mg/dL (ref 0.61–1.24)

## 2017-11-07 MED ORDER — REGADENOSON 0.4 MG/5ML IV SOLN
INTRAVENOUS | Status: AC
  Filled 2017-11-07: qty 5

## 2017-11-07 MED ORDER — GADOBENATE DIMEGLUMINE 529 MG/ML IV SOLN: 40.0000 mL | Freq: Once | INTRAVENOUS | Status: DC

## 2017-11-07 MED ORDER — GADOBENATE DIMEGLUMINE 529 MG/ML IV SOLN
32.0000 mL | Freq: Once | INTRAVENOUS | Status: AC | PRN
  Administered 2017-11-07: 32 mL via INTRAVENOUS

## 2017-11-08 ENCOUNTER — Encounter: Payer: 59 | Admitting: *Deleted

## 2017-11-08 DIAGNOSIS — I2102 ST elevation (STEMI) myocardial infarction involving left anterior descending coronary artery: Secondary | ICD-10-CM

## 2017-11-08 DIAGNOSIS — Z48812 Encounter for surgical aftercare following surgery on the circulatory system: Secondary | ICD-10-CM | POA: Diagnosis not present

## 2017-11-08 NOTE — Progress Notes (Signed)
Daily Session Note  Patient Details  Name: Bobby [name] MRN: [id number] Date of Birth: [birth year] Referring Provider:     Cardiac Rehab from 09/10/2017 in [facility] Cardiac and Pulmonary Rehab  Referring Provider  Adrian Prows MD      Encounter Date: 11/08/2017  Check In: Session Check In - 11/08/17 1715      Check-In   Location  [facility]-Cardiac & Pulmonary Rehab    Staff Present  Earlean Shawl, BS, ACSM CEP, Exercise Physiologist;Carroll Enterkin, RN, BSN;Other    Supervising physician immediately available to respond to emergencies  See telemetry face sheet for immediately available ER MD    Medication changes reported      No    Fall or balance concerns reported     No    Warm-up and Cool-down  Performed on first and last piece of equipment    Resistance Training Performed  Yes    VAD Patient?  No      Pain Assessment   Currently in Pain?  No/denies    Multiple Pain Sites  No          Social History   Tobacco Use  Smoking Status Former Smoker  . Packs/day: 0.50  . Types: Cigarettes  . Last attempt to quit: 07/13/2017  . Years since quitting: 0.3  Smokeless Tobacco Never Used  Tobacco Comment   quit 2012    Goals Met:  Independence with exercise equipment Exercise tolerated well No report of cardiac concerns or symptoms Strength training completed today  Goals Unmet:  Not Applicable  Comments: Pt able to follow exercise prescription today without complaint.  Will continue to monitor for progression.    Dr. Emily Filbert is Medical Director for [place] and [facility].

## 2017-11-12 DIAGNOSIS — I2102 ST elevation (STEMI) myocardial infarction involving left anterior descending coronary artery: Secondary | ICD-10-CM

## 2017-11-12 DIAGNOSIS — Z48812 Encounter for surgical aftercare following surgery on the circulatory system: Secondary | ICD-10-CM | POA: Diagnosis not present

## 2017-11-12 NOTE — Progress Notes (Signed)
Daily Session Note  Patient Details  Name: Domnique Vantine MRN: [id number] Date of Birth: 1971-12-13 Referring Provider:     Cardiac Rehab from 09/10/2017 in [facility] Cardiac and Pulmonary Rehab  Referring Provider  Adrian Prows MD      Encounter Date: 11/12/2017  Check In: Session Check In - 11/12/17 1732      Check-In   Location  [facility]-Cardiac & Pulmonary Rehab    Staff Present  Renita Papa, RN Moises Blood, BS, ACSM CEP, Exercise Physiologist;Amanda Oletta Darter, [place], ACSM CEP, Exercise Physiologist    Supervising physician immediately available to respond to emergencies  See telemetry face sheet for immediately available ER MD    Medication changes reported      No    Fall or balance concerns reported     No    Warm-up and Cool-down  Performed on first and last piece of equipment    Resistance Training Performed  Yes    VAD Patient?  No      Pain Assessment   Currently in Pain?  No/denies          Social History   Tobacco Use  Smoking Status Former Smoker  . Packs/day: 0.50  . Types: Cigarettes  . Last attempt to quit: 07/13/2017  . Years since quitting: 0.3  Smokeless Tobacco Never Used  Tobacco Comment   quit 2012    Goals Met:  Independence with exercise equipment Exercise tolerated well No report of cardiac concerns or symptoms Strength training completed today  Goals Unmet:  Not Applicable  Comments: Pt able to follow exercise prescription today without complaint.  Will continue to monitor for progression.    Dr. Emily Filbert is Medical Director for [place] and [facility].

## 2017-11-14 ENCOUNTER — Telehealth: Payer: Self-pay | Admitting: *Deleted

## 2017-11-14 NOTE — Telephone Encounter (Signed)
Called to tell us that he will not be attending class today 11/21. He should be back on Monday.

## 2017-11-23 ENCOUNTER — Telehealth: Payer: Self-pay

## 2017-11-23 NOTE — Progress Notes (Signed)
Cardiac Individual Treatment Plan  Patient Details  Name: Bobby [name] MRN: [id number] Date of Birth: [birth year] Referring Provider:     Cardiac Rehab from 09/10/2017 in [facility] Cardiac and Pulmonary Rehab  Referring Provider  Adrian Prows MD      Initial Encounter Date:    Cardiac Rehab from 09/10/2017 in [facility] Cardiac and Pulmonary Rehab  Date  09/10/17  Referring Provider  Adrian Prows MD      Visit Diagnosis: No diagnosis found.  Patient's Home Medications on Admission:  Current Outpatient Medications:  .  aspirin 81 MG tablet, Take 81 mg by mouth daily., Disp: , Rfl:  .  carvedilol (COREG) 3.125 MG tablet, Take 1 tablet (3.125 mg total) by mouth 2 (two) times daily with a meal., Disp: 90 tablet, Rfl: 0 .  Cholecalciferol (VITAMIN D) 2000 units CAPS, Take 2,000 Units by mouth 3 (three) times a week., Disp: , Rfl:  .  levothyroxine (SYNTHROID, LEVOTHROID) 137 MCG tablet, Take 137 mcg by mouth daily before breakfast., Disp: , Rfl:  .  Multiple Vitamin (MULTIVITAMIN WITH MINERALS) TABS tablet, Take 1 tablet by mouth daily., Disp: , Rfl:  .  nitroGLYCERIN (NITROSTAT) 0.4 MG SL tablet, Place 1 tablet (0.4 mg total) under the tongue every 5 (five) minutes as needed for chest pain., Disp: 25 tablet, Rfl: 3 .  rosuvastatin (CRESTOR) 20 MG tablet, Take 1 tablet (20 mg total) by mouth daily. (Patient taking differently: Take 20 mg by mouth every evening. ), Disp: 90 tablet, Rfl: 0 .  sacubitril-valsartan (ENTRESTO) 24-26 MG, Take 1 tablet by mouth 2 (two) times daily., Disp: , Rfl:  .  ticagrelor (BRILINTA) 90 MG TABS tablet, Take 1 tablet (90 mg total) by mouth 2 (two) times daily., Disp: 60 tablet, Rfl: 0  Past Medical History: Past Medical History:  Diagnosis Date  . Graves disease   . Hypertension   . MI (myocardial infarction) ([place])   . Thyroid disease    thyroid irradiated per pt    Tobacco Use: Social History   Tobacco Use  Smoking Status Former Smoker  . Packs/day: 0.50  .  Types: Cigarettes  . Last attempt to quit: 07/13/2017  . Years since quitting: 0.3  Smokeless Tobacco Never Used  Tobacco Comment   quit 2012    Labs: Recent Review Flowsheet Data    Labs for ITP Cardiac and Pulmonary Rehab Latest Ref Rng & Units 03/15/2012 07/13/2017 07/14/2017   Cholestrol 0 - 200 mg/dL - - 130   LDLCALC 0 - 99 mg/dL - - 67   HDL >40 mg/dL - - 37(L)   Trlycerides <150 mg/dL - - 129   Hemoglobin A1c 4.8 - 5.6 % - 6.0(H) -   TCO2 0 - 100 mmol/L 27 - -   O2SAT % - 65.8 -       Exercise Target Goals:    Exercise Program Goal: Individual exercise prescription set with THRR, safety & activity barriers. Participant demonstrates ability to understand and report RPE using BORG scale, to self-measure pulse accurately, and to acknowledge the importance of the exercise prescription.  Exercise Prescription Goal: Starting with aerobic activity 30 plus minutes a day, 3 days per week for initial exercise prescription. Provide home exercise prescription and guidelines that participant acknowledges understanding prior to discharge.  Activity Barriers & Risk Stratification: Activity Barriers & Cardiac Risk Stratification - 09/10/17 1255      Activity Barriers & Cardiac Risk Stratification   Activity Barriers  Chest Pain/Angina Hx of throat  tightness    Cardiac Risk Stratification  High       6 Minute Walk: 6 Minute Walk    Row Name 09/10/17 1445         6 Minute Walk   Phase  Initial     Distance  1333 feet     Walk Time  6 minutes     # of Rest Breaks  0     MPH  2.52     METS  4.32     RPE  7     VO2 Peak  15.11     Symptoms  No     Resting HR  89 bpm     Resting BP  146/74     Resting Oxygen Saturation   99 %     Exercise Oxygen Saturation  during 6 min walk  98 %     Max Ex. HR  115 bpm     Max Ex. BP  124/70     2 Minute Post BP  116/62        Oxygen Initial Assessment: Oxygen Initial Assessment - 10/05/17 0925      Home Oxygen   Home Oxygen  Device  None    Home Exercise Oxygen Prescription  None    Home at Rest Exercise Oxygen Prescription  None      Initial 6 min Walk   Oxygen Used  None      Program Oxygen Prescription   Program Oxygen Prescription  None       Oxygen Re-Evaluation:   Oxygen Discharge (Final Oxygen Re-Evaluation):   Initial Exercise Prescription: Initial Exercise Prescription - 09/10/17 1400      Date of Initial Exercise RX and Referring Provider   Date  09/10/17    Referring Provider  Adrian Prows MD      Treadmill   MPH  3    Grade  2.5    Minutes  15    METs  4.33      Recumbant Elliptical   Level  3    RPM  50    Minutes  15    METs  3.5      Elliptical   Level  2    Speed  4.3    Minutes  15      Prescription Details   Frequency (times per week)  3    Duration  Progress to 45 minutes of aerobic exercise without signs/symptoms of physical distress      Intensity   THRR 40-80% of Max Heartrate  123-156    Ratings of Perceived Exertion  11-13    Perceived Dyspnea  0-4      Progression   Progression  Continue to progress workloads to maintain intensity without signs/symptoms of physical distress.      Resistance Training   Training Prescription  Yes    Weight  5 lbs       Perform Capillary Blood Glucose checks as needed.  Exercise Prescription Changes: Exercise Prescription Changes    Row Name 09/10/17 1400 09/28/17 0900 10/11/17 1600 10/12/17 1200 10/24/17 1200     Response to Exercise   Blood Pressure (Admit)  146/74  112/82  -  112/64  98/68   Blood Pressure (Exercise)  124/70  142/66  -  140/68  162/70   Blood Pressure (Exit)  116/62  120/76  -  96/56  110/68   Heart Rate (Admit)  89 bpm  93 bpm  -  79 bpm  76 bpm   Heart Rate (Exercise)  115 bpm  150 bpm  -  150 bpm  151 bpm   Heart Rate (Exit)  83 bpm  122 bpm  -  101 bpm  91 bpm   Oxygen Saturation (Admit)  99 %  -  -  -  -   Oxygen Saturation (Exercise)  98 %  -  -  -  -   Rating of Perceived Exertion  (Exercise)  7  13  -  14  14   Symptoms  none  none  -  none  none   Comments  walk test results  -  -  -  -   Duration  Progress to 45 minutes of aerobic exercise without signs/symptoms of physical distress  Progress to 45 minutes of aerobic exercise without signs/symptoms of physical distress  -  Continue with 45 min of aerobic exercise without signs/symptoms of physical distress.  Continue with 45 min of aerobic exercise without signs/symptoms of physical distress.   Intensity  THRR unchanged  THRR unchanged  -  THRR unchanged  THRR unchanged     Progression   Progression  -  -  -  Continue to progress workloads to maintain intensity without signs/symptoms of physical distress.  Continue to progress workloads to maintain intensity without signs/symptoms of physical distress.   Average METs  -  -  -  5.88  7.3     Resistance Training   Training Prescription  -  Yes  -  Yes  Yes   Weight  -  5 lb  -  5 lb  5 lb   Reps  -  10-15  -  10-15  10-15     Interval Training   Interval Training  -  No  -  -  No     Treadmill   MPH  -  3  -  3  -   Grade  -  2.5  -  2.5  -   Minutes  -  15  -  15  -   METs  -  4.33  -  4.54  -     Recumbant Elliptical   Level  -  3  -  3  5   RPM  -  50  -  50  50   Minutes  -  15  -  15  15   METs  -  4  -  7.2  7.3     Elliptical   Level  -  2  -  2  2   Speed  -  4.3  -  4.3  4.3   Minutes  -  15  -  15  15     Home Exercise Plan   Plans to continue exercise at  -  -  Home (comment) walk, bike (mountain bike)  -  Home (comment) walk, bike (mountain bike)   Frequency  -  -  Add 2 additional days to program exercise sessions.  -  Add 2 additional days to program exercise sessions.   Initial Home Exercises Provided  -  -  10/11/17  -  10/11/17   Row Name 11/07/17 1300             Response to Exercise   Blood Pressure (Admit)  94/62       Blood Pressure (Exercise)  160/82       Blood  Pressure (Exit)  98/66       Heart Rate (Admit)  83 bpm         Heart Rate (Exercise)  153 bpm       Heart Rate (Exit)  88 bpm       Rating of Perceived Exertion (Exercise)  15       Symptoms  none       Duration  Continue with 45 min of aerobic exercise without signs/symptoms of physical distress.       Intensity  THRR unchanged         Progression   Progression  Continue to progress workloads to maintain intensity without signs/symptoms of physical distress.         Resistance Training   Training Prescription  Yes       Weight  10 lbs       Reps  10-15         Interval Training   Interval Training  Yes       Equipment  Elliptical;REL-XR         Treadmill   MPH  3.3       Grade  2.5       Minutes  15       METs  4.66         Elliptical   Level  2       Speed  4.3       Minutes  15         Home Exercise Plan   Plans to continue exercise at  Home (comment) walk, bike (mountain bike)       Frequency  Add 2 additional days to program exercise sessions.       Initial Home Exercises Provided  10/11/17          Exercise Comments: Exercise Comments    Row Name 09/19/17 1703 10/11/17 1628 10/31/17 1718       Exercise Comments  First full day of exercise!  Patient was oriented to gym and equipment including functions, settings, policies, and procedures.  Patient's individual exercise prescription and treatment plan were reviewed.  All starting workloads were established based on the results of the 6 minute walk test done at initial orientation visit.  The plan for exercise progression was also introduced and progression will be customized based on patient's performance and goals.   Reviewed home exercise with pt today.  Pt plans to walk and bike for exercise.  Reviewed THR, pulse, RPE, sign and symptoms, NTG use, and when to call 911 or MD.  Also discussed weather considerations and indoor options.  Pt voiced understanding.  Felt dizzy and lightheaded during resistance work.  Rested and had water and felt better        Exercise Goals and  Review: Exercise Goals    Row Name 09/10/17 1453             Exercise Goals   Increase Physical Activity  Yes       Intervention  Provide advice, education, support and counseling about physical activity/exercise needs.;Develop an individualized exercise prescription for aerobic and resistive training based on initial evaluation findings, risk stratification, comorbidities and participant's personal goals.       Expected Outcomes  Achievement of increased cardiorespiratory fitness and enhanced flexibility, muscular endurance and strength shown through measurements of functional capacity and personal statement of participant.       Increase Strength and Stamina  Yes  Intervention  Provide advice, education, support and counseling about physical activity/exercise needs.;Develop an individualized exercise prescription for aerobic and resistive training based on initial evaluation findings, risk stratification, comorbidities and participant's personal goals.       Expected Outcomes  Achievement of increased cardiorespiratory fitness and enhanced flexibility, muscular endurance and strength shown through measurements of functional capacity and personal statement of participant.       Able to understand and use rate of perceived exertion (RPE) scale  Yes       Intervention  Provide education and explanation on how to use RPE scale       Expected Outcomes  Short Term: Able to use RPE daily in rehab to express subjective intensity level;Long Term:  Able to use RPE to guide intensity level when exercising independently       Knowledge and understanding of Target Heart Rate Range (THRR)  Yes       Intervention  Provide education and explanation of THRR including how the numbers were predicted and where they are located for reference       Expected Outcomes  Short Term: Able to state/look up THRR;Long Term: Able to use THRR to govern intensity when exercising independently;Short Term: Able to use daily  as guideline for intensity in rehab       Able to check pulse independently  Yes       Intervention  Provide education and demonstration on how to check pulse in carotid and radial arteries.;Review the importance of being able to check your own pulse for safety during independent exercise       Expected Outcomes  Short Term: Able to explain why pulse checking is important during independent exercise;Long Term: Able to check pulse independently and accurately       Understanding of Exercise Prescription  Yes       Intervention  Provide education, explanation, and written materials on patient's individual exercise prescription       Expected Outcomes  Long Term: Able to explain home exercise prescription to exercise independently;Short Term: Able to explain program exercise prescription          Exercise Goals Re-Evaluation : Exercise Goals Re-Evaluation    Row Name 09/28/17 0951 10/24/17 1249 11/07/17 1306         Exercise Goal Re-Evaluation   Exercise Goals Review  Increase Physical Activity;Increase Strength and Stamina;Able to understand and use rate of perceived exertion (RPE) scale  Increase Physical Activity;Increase Strength and Stamina  Increase Physical Activity;Increase Strength and Stamina;Able to understand and use rate of perceived exertion (RPE) scale;Knowledge and understanding of Target Heart Rate Range (THRR)     Comments  Bobby [name] is tolerating exercise well.  Staff will contiue to monitor for progress.  Bobby [name] continues to progress well with exercise.  Bobby [name] states he is tolerating interval training well.  Staff reviewed the importance of cool down with the higher intensity work.     Expected Outcomes  Short - Bobby [name] will attend HT regularly.  Long - Bobby [name] will develop healhier lifestyle habits.  Short - add interval training to program.  Long - Maintain fitness gains  Short - Bobby [name] will continue HIIT.  Long - Bobby [name] will improve overall fitness level.        Discharge Exercise  Prescription (Final Exercise Prescription Changes): Exercise Prescription Changes - 11/07/17 1300      Response to Exercise   Blood Pressure (Admit)  94/62    Blood Pressure (Exercise)  160/82  Blood Pressure (Exit)  98/66    Heart Rate (Admit)  83 bpm    Heart Rate (Exercise)  153 bpm    Heart Rate (Exit)  88 bpm    Rating of Perceived Exertion (Exercise)  15    Symptoms  none    Duration  Continue with 45 min of aerobic exercise without signs/symptoms of physical distress.    Intensity  THRR unchanged      Progression   Progression  Continue to progress workloads to maintain intensity without signs/symptoms of physical distress.      Resistance Training   Training Prescription  Yes    Weight  10 lbs    Reps  10-15      Interval Training   Interval Training  Yes    Equipment  Elliptical;REL-XR      Treadmill   MPH  3.3    Grade  2.5    Minutes  15    METs  4.66      Elliptical   Level  2    Speed  4.3    Minutes  15      Home Exercise Plan   Plans to continue exercise at  Home (comment) walk, bike (mountain bike)    Frequency  Add 2 additional days to program exercise sessions.    Initial Home Exercises Provided  10/11/17       Nutrition:  Target Goals: Understanding of nutrition guidelines, daily intake of sodium <1552m, cholesterol <2064m calories 30% from fat and 7% or less from saturated fats, daily to have 5 or more servings of fruits and vegetables.  Biometrics: Pre Biometrics - 09/10/17 1454      Pre Biometrics   Height  6' 1.25" (1.861 m)    Weight  218 lb 3.2 oz (99 kg)    Waist Circumference  40 inches    Hip Circumference  39.75 inches    Waist to Hip Ratio  1.01 %    BMI (Calculated)  28.58    Single Leg Stand  30 seconds        Nutrition Therapy Plan and Nutrition Goals: Nutrition Therapy & Goals - 11/08/17 1708      Nutrition Therapy   RD appointment defered  Yes       Nutrition Discharge: Rate Your Plate Scores: Nutrition  Assessments - 09/10/17 1247      MEDFICTS Scores   Pre Score  32       Nutrition Goals Re-Evaluation:   Nutrition Goals Discharge (Final Nutrition Goals Re-Evaluation):   Psychosocial: Target Goals: Acknowledge presence or absence of significant depression and/or stress, maximize coping skills, provide positive support system. Participant is able to verbalize types and ability to use techniques and skills needed for reducing stress and depression.   Initial Review & Psychosocial Screening: Initial Psych Review & Screening - 09/10/17 1259      Initial Review   Current issues with  History of Depression;Current Anxiety/Panic;Current Stress Concerns    Source of Stress Concerns  Family    Comments  Bobby [name] he did not have plans to hurt himself. His PHQ9 score was 14.      Family Dynamics   Good Support System?  Yes      Barriers   Psychosocial barriers to participate in program  The patient should benefit from training in stress management and relaxation.      Screening Interventions   Interventions  Encouraged to exercise;Program counselor consult;Yes    Expected Outcomes  Short Term goal: Utilizing psychosocial counselor, staff and physician to assist with identification of specific Stressors or current issues interfering with healing process. Setting desired goal for each stressor or current issue identified.;Long Term Goal: Stressors or current issues are controlled or eliminated.;Short Term goal: Identification and review with participant of any Quality of Life or Depression concerns found by scoring the questionnaire.;Long Term goal: The participant improves quality of Life and PHQ9 Scores as seen by post scores and/or verbalization of changes       Quality of Life Scores:  Quality of Life - 09/10/17 1248      Quality of Life Scores   Health/Function Pre  12 %    Socioeconomic Pre  19.57 %    Psych/Spiritual Pre  10.14 %    Family Pre  24 %    GLOBAL Pre  14.94 %         PHQ-9: Recent Review Flowsheet Data    Depression screen [facility] 2/9 10/15/2017 09/10/2017 09/10/2017   Decreased Interest 1  - 2   Down, Depressed, Hopeless 0 - 2   PHQ - 2 Score 1 - 4   Altered sleeping 0 - 2   Tired, decreased energy 1 - 3   Change in appetite 0 - 0   Feeling bad or failure about yourself  0 - 2   Trouble concentrating 1 - 2   Moving slowly or fidgety/restless 0 - 0   Suicidal thoughts 0 (No Data)  1   PHQ-9 Score 3 - 14   Difficult doing work/chores - - Somewhat difficult     Interpretation of Total Score  Total Score Depression Severity:  1-4 = Minimal depression, 5-9 = Mild depression, 10-14 = Moderate depression, 15-19 = Moderately severe depression, 20-27 = Severe depression   Psychosocial Evaluation and Intervention: Psychosocial Evaluation - 09/19/17 1711      Psychosocial Evaluation & Interventions   Interventions  Encouraged to exercise with the program and follow exercise prescription;Relaxation education;Stress management education    Comments  Counselor met with Bobby [name] today Bobby [name]) for initial psychosocial evaluation.  He is a 46 year old who had his 2nd heart attack this past July and had a stent inserted.  He has a limited support system with his family living in [place], but he lives with a significant other that is a positive person for him at this time.  Bobby [name] has had Graves disease for 6-8 years and reports along with his heart condition it all impacts his energy levels and he reports sleeping too much.  He states his appetite is stable.  Bobby [name] admits to a history of depression and anxiety and has taken various medications for this over time.  However he is no longer on any of these.  He has "given up" drinking and smoking as well and is on a strict diet due to his health; so he declares all of his "comforts" are gone.  Bobby [name] states his mood is stable at this time although his PHQ-9 scores indicate he has moderate depression with a score of "14."  He has  returned to work; and has given up his negative coping strategies.  He is looking for ways to increase his support system and mentioned going back to church in the near future.  Javoris has goals to increase his energy; stamina and strength while in this program.  He hopes to "be better mentally and physically" upon completion.  Counselor will follow with Bobby [name] re: his mood/ sleep  and depressive symptoms over the next few weeks.  Counselor mentioned him possibly seeing a therapist or talking to his Dr. about medication - which he was not in favor of at this time.      Expected Outcomes  Bobby [name] will benefit from consistent exercise to achieve his stated goals.  The educational and psychoeducational components will be helpful in [name] learning how to manage his disease and learn positive coping strategies.      Continue Psychosocial Services   Follow up required by counselor       Psychosocial Re-Evaluation: Psychosocial Re-Evaluation    [place] Name 10/11/17 0254 11/05/17 1716           Psychosocial Re-Evaluation   Comments  Bobby [name] still has stress at work.  He states exercise has helped some.  Staff reviewed deep breathing to assisst with stress management.  Counselor Follow up with Bobby [name] today reporting experiencing a great deal of progress while in this program - primarily emotionally and mentally.  He reports his mood; energy levels; sleep; outlook on life and feeling better about himself have all improved since he began working out consistently.  He continues to have stress in his life but is exercising and practicing deep breathing techniques to cope with this in a more positive way.  Counselor commended Pharmacologist for all of his hard work and progress made with his positive commitment to consistent exercise.        Expected Outcomes  Short - Bobby [name] will continue to exercise and will add breathing techniques to manage stress.  Long - Bobby [name] will be able to manage stress with exercise and breathing techniques.  -       Interventions  -  Stress management education         Psychosocial Discharge (Final Psychosocial Re-Evaluation): Psychosocial Re-Evaluation - 11/05/17 1716      Psychosocial Re-Evaluation   Comments  Counselor Follow up with Bobby [name] today reporting experiencing a great deal of progress while in this program - primarily emotionally and mentally.  He reports his mood; energy levels; sleep; outlook on life and feeling better about himself have all improved since he began working out consistently.  He continues to have stress in his life but is exercising and practicing deep breathing techniques to cope with this in a more positive way.  Counselor commended Pharmacologist for all of his hard work and progress made with his positive commitment to consistent exercise.      Interventions  Stress management education       Vocational Rehabilitation: Provide vocational rehab assistance to qualifying candidates.   Vocational Rehab Evaluation & Intervention: Vocational Rehab - 09/10/17 1249      Initial Vocational Rehab Evaluation & Intervention   Assessment shows need for Vocational Rehabilitation  No       Education: Education Goals: Education classes will be provided on a variety of topics geared toward better understanding of heart health and risk factor modification. Participant will state understanding/return demonstration of topics presented as noted by education test scores.  Learning Barriers/Preferences: Learning Barriers/Preferences - 09/10/17 1252      Learning Barriers/Preferences   Learning Barriers  None    Learning Preferences  Individual Instruction       Education Topics: General Nutrition Guidelines/Fats and Fiber: -Group instruction provided by verbal, written material, models and posters to present the general guidelines for heart healthy nutrition. Gives an explanation and review of dietary fats and fiber.   Cardiac Rehab from 11/12/2017 in [facility]  Cardiac and Pulmonary Rehab  Date   10/08/17  Educator  PI  Instruction Review Code  1- Verbalizes Understanding      Controlling Sodium/Reading Food Labels: -Group verbal and written material supporting the discussion of sodium use in heart healthy nutrition. Review and explanation with models, verbal and written materials for utilization of the food label.   Cardiac Rehab from 11/12/2017 in [facility] Cardiac and Pulmonary Rehab  Date  10/15/17  Educator  PI  Instruction Review Code  1- Verbalizes Understanding      Exercise Physiology & Risk Factors: - Group verbal and written instruction with models to review the exercise physiology of the cardiovascular system and associated critical values. Details cardiovascular disease risk factors and the goals associated with each risk factor.   Cardiac Rehab from 11/12/2017 in [facility] Cardiac and Pulmonary Rehab  Date  10/22/17  Educator  [facility]  Instruction Review Code  1- Verbalizes Understanding      Aerobic Exercise & Resistance Training: - Gives group verbal and written discussion on the health impact of inactivity. On the components of aerobic and resistive training programs and the benefits of this training and how to safely progress through these programs.   Flexibility, Balance, General Exercise Guidelines: - Provides group verbal and written instruction on the benefits of flexibility and balance training programs. Provides general exercise guidelines with specific guidelines to those with heart or lung disease. Demonstration and skill practice provided.   Stress Management: - Provides group verbal and written instruction about the health risks of elevated stress, cause of high stress, and healthy ways to reduce stress.   Depression: - Provides group verbal and written instruction on the correlation between heart/lung disease and depressed mood, treatment options, and the stigmas associated with seeking treatment.   Anatomy & Physiology of the Heart: - Group verbal and  written instruction and models provide basic cardiac anatomy and physiology, with the coronary electrical and arterial systems. Review of: AMI, Angina, Valve disease, Heart Failure, Cardiac Arrhythmia, Pacemakers, and the ICD.   Cardiac Rehab from 11/12/2017 in [facility] Cardiac and Pulmonary Rehab  Date  11/05/17  Educator  [facility]  Instruction Review Code  1- Verbalizes Understanding      Cardiac Procedures: - Group verbal and written instruction to review commonly prescribed medications for heart disease. Reviews the medication, class of the drug, and side effects. Includes the steps to properly store meds and maintain the prescription regimen. (beta blockers and nitrates)   Cardiac Rehab from 11/12/2017 in [facility] Cardiac and Pulmonary Rehab  Date  11/12/17  Educator  [facility]  Instruction Review Code  1- Verbalizes Understanding      Cardiac Medications I: - Group verbal and written instruction to review commonly prescribed medications for heart disease. Reviews the medication, class of the drug, and side effects. Includes the steps to properly store meds and maintain the prescription regimen.   Cardiac Medications II: -Group verbal and written instruction to review commonly prescribed medications for heart disease. Reviews the medication, class of the drug, and side effects. (all other drug classes)    Go Sex-Intimacy & Heart Disease, Get SMART - Goal Setting: - Group verbal and written instruction through game format to discuss heart disease and the return to sexual intimacy. Provides group verbal and written material to discuss and apply goal setting through the application of the S.M.A.R.T. Method.   Cardiac Rehab from 11/12/2017 in [facility] Cardiac and Pulmonary Rehab  Date  11/12/17  Educator  [facility]  Instruction  Review Code  1- Verbalizes Understanding      Other Matters of the Heart: - Provides group verbal, written materials and models to describe Heart Failure, Angina, Valve Disease,  Peripheral Artery Disease, and Diabetes in the realm of heart disease. Includes description of the disease process and treatment options available to the cardiac patient.   Cardiac Rehab from 11/12/2017 in [facility] Cardiac and Pulmonary Rehab  Date  11/05/17  Educator  [facility]  Instruction Review Code  1- Verbalizes Understanding      Exercise & Equipment Safety: - Individual verbal instruction and demonstration of equipment use and safety with use of the equipment.   Cardiac Rehab from 11/12/2017 in [facility] Cardiac and Pulmonary Rehab  Date  09/10/17  Educator  C. EnterkinRN  Instruction Review Code  3- Needs Reinforcement      Infection Prevention: - Provides verbal and written material to individual with discussion of infection control including proper hand washing and proper equipment cleaning during exercise session.   Cardiac Rehab from 11/12/2017 in [facility] Cardiac and Pulmonary Rehab  Date  09/10/17  Educator  C. EnterkinRN  Instruction Review Code  1- Verbalizes Understanding      Falls Prevention: - Provides verbal and written material to individual with discussion of falls prevention and safety.   Cardiac Rehab from 11/12/2017 in [facility] Cardiac and Pulmonary Rehab  Date  09/10/17  Educator  [facility] [place]  Instruction Review Code  1- Verbalizes Understanding      Diabetes: - Individual verbal and written instruction to review signs/symptoms of diabetes, desired ranges of glucose level fasting, after meals and with exercise. Acknowledge that pre and post exercise glucose checks will be done for 3 sessions at entry of program.   Other: -Provides group and verbal instruction on various topics (see comments)   Cardiac Rehab from 11/12/2017 in [facility] Cardiac and Pulmonary Rehab  Date  10/31/17 [Risk Factors and Know Your Numbers]  Educator  [facility]  Instruction Review Code  1- Verbalizes Understanding       Knowledge Questionnaire Score: Knowledge Questionnaire Score - 09/10/17 1249        Knowledge Questionnaire Score   Pre Score  27       Core Components/Risk Factors/Patient Goals at Admission: Personal Goals and Risk Factors at Admission - 09/10/17 1257      Core Components/Risk Factors/Patient Goals on Admission    Weight Management  Yes;Weight Loss    Intervention  Weight Management: Develop a combined nutrition and exercise program designed to reach desired caloric intake, while maintaining appropriate intake of nutrient and fiber, sodium and fats, and appropriate energy expenditure required for the weight goal.;Weight Management: Provide education and appropriate resources to help participant work on and attain dietary goals.;Weight Management/Obesity: Establish reasonable short term and long term weight goals.;Obesity: Provide education and appropriate resources to help participant work on and attain dietary goals.    Admit Weight  218 lb 3.2 oz (99 kg)    Goal Weight: Short Term  213 lb (96.6 kg)    Goal Weight: Long Term  200 lb (90.7 kg)    Expected Outcomes  Short Term: Continue to assess and modify interventions until short term weight is achieved;Weight Loss: Understanding of general recommendations for a balanced deficit meal plan, which promotes 1-2 lb weight loss per week and includes a negative energy balance of [phone] kcal/d;Understanding recommendations for meals to include 15-35% energy as protein, 25-35% energy from fat, 35-60% energy from carbohydrates, less than 271m of dietary  cholesterol, 20-35 gm of total fiber daily;Understanding of distribution of calorie intake throughout the day with the consumption of 4-5 meals/snacks;Long Term: Adherence to nutrition and physical activity/exercise program aimed toward attainment of established weight goal    Hypertension  Yes    Intervention  Provide education on lifestyle modifcations including regular physical activity/exercise, weight management, moderate sodium restriction and increased consumption of fresh  fruit, vegetables, and low fat dairy, alcohol moderation, and smoking cessation.;Monitor prescription use compliance.    Expected Outcomes  Short Term: Continued assessment and intervention until BP is < 140/39m HG in hypertensive participants. < 130/85mHG in hypertensive participants with diabetes, heart failure or chronic kidney disease.;Long Term: Maintenance of blood pressure at goal levels.    Lipids  Yes    Intervention  Provide education and support for participant on nutrition & aerobic/resistive exercise along with prescribed medications to achieve LDL <7093mHDL >68m19m  Expected Outcomes  Short Term: Participant states understanding of desired cholesterol values and is compliant with medications prescribed. Participant is following exercise prescription and nutrition guidelines.;Long Term: Cholesterol controlled with medications as prescribed, with individualized exercise RX and with personalized nutrition plan. Value goals: LDL < 70mg15mL > 40 mg.    Stress  Yes    Intervention  Offer individual and/or small group education and counseling on adjustment to heart disease, stress management and health-related lifestyle change. Teach and support self-help strategies.;Refer participants experiencing significant psychosocial distress to appropriate mental health specialists for further evaluation and treatment. When possible, include family members and significant others in education/counseling sessions.    Expected Outcomes  Short Term: Participant demonstrates changes in health-related behavior, relaxation and other stress management skills, ability to obtain effective social support, and compliance with psychotropic medications if prescribed.;Long Term: Emotional wellbeing is indicated by absence of clinically significant psychosocial distress or social isolation.       Core Components/Risk Factors/Patient Goals Review:  Goals and Risk Factor Review    Row Name 10/11/17 1629 11/08/17 1706             Core Components/Risk Factors/Patient Goals Review   Personal Goals Review  Stress  Stress      Review  Bobby [name] his stress is still up at work.  He is using exrecise to help.  Staff reviewed realxation breathing as another tool to help.  Bobby [name] stress MRI yesterday and said the injection was uncomfortable. Bobby [name] since he has started Cardiac REhab his throat pain (his angina equilivant) has gone from an average of 8/10 to 3/10 and some days does not have it when he exercises.       Expected Outcomes  Short - Meilech Jacquelyn continue to exercise and will add deep breathing to help with stress.  Long - Esteven Candon manage his stress with exercise and breathing techniques.  Stress management with exercise and talking with our mental health counselor and taking an antidepressant.          Core Components/Risk Factors/Patient Goals at Discharge (Final Review):  Goals and Risk Factor Review - 11/08/17 1706      Core Components/Risk Factors/Patient Goals Review   Personal Goals Review  Stress    Review  Martyn Akulhis stress MRI yesterday and said the injection was uncomfortable. Dandrea Lenard since he has started Cardiac REhab his throat pain (his angina equilivant) has gone from an average of 8/10 to 3/10 and some days does not have it when he exercises.  Expected Outcomes  Stress management with exercise and talking with our mental health counselor and taking an antidepressant.        ITP Comments: ITP Comments    Row Name 09/10/17 1254 09/10/17 1330 10/03/17 0815 10/03/17 0819 10/31/17 0607   ITP Comments  ITP Created during Cardiac Rehab Medical Review after Cardiac Rehab informed consent was signed.   1.smokes for stress but I am having no problems giving it up after my last heart attack. I talked with him about counseling and he said I haven't done that in the past "why tell other people my problems".  He checked Current Anxiety/Panic on our [place]. Totat PHQ9 14. Age 46.     -  30 day review. Continue with ITP unless directed changes per Medical Director Review.   30 day review. Continue with ITP unless directed changes per Medical Director review.       Comments: discharge ITP

## 2017-11-23 NOTE — Progress Notes (Signed)
Discharge Progress Report  Patient Details  Name: Bobby [name] MRN: [id number] Date of Birth: 1971-01-02 Referring Provider:     Cardiac Rehab from 09/10/2017 in [facility] Cardiac and Pulmonary Rehab  Referring Provider  Adrian Prows MD       Number of Visits: 27  Reason for Discharge:  Early Exit:  Insurance  Smoking History:  Social History   Tobacco Use  Smoking Status Former Smoker  . Packs/day: 0.50  . Types: Cigarettes  . Last attempt to quit: 07/13/2017  . Years since quitting: 0.3  Smokeless Tobacco Never Used  Tobacco Comment   quit 2012    Diagnosis:  No diagnosis found.  ADL UCSD:   Initial Exercise Prescription: Initial Exercise Prescription - 09/10/17 1400      Date of Initial Exercise RX and Referring Provider   Date  09/10/17    Referring Provider  Adrian Prows MD      Treadmill   MPH  3    Grade  2.5    Minutes  15    METs  4.33      Recumbant Elliptical   Level  3    RPM  50    Minutes  15    METs  3.5      Elliptical   Level  2    Speed  4.3    Minutes  15      Prescription Details   Frequency (times per week)  3    Duration  Progress to 45 minutes of aerobic exercise without signs/symptoms of physical distress      Intensity   THRR 40-80% of Max Heartrate  123-156    Ratings of Perceived Exertion  11-13    Perceived Dyspnea  0-4      Progression   Progression  Continue to progress workloads to maintain intensity without signs/symptoms of physical distress.      Resistance Training   Training Prescription  Yes    Weight  5 lbs       Discharge Exercise Prescription (Final Exercise Prescription Changes): Exercise Prescription Changes - 11/07/17 1300      Response to Exercise   Blood Pressure (Admit)  94/62    Blood Pressure (Exercise)  160/82    Blood Pressure (Exit)  98/66    Heart Rate (Admit)  83 bpm    Heart Rate (Exercise)  153 bpm    Heart Rate (Exit)  88 bpm    Rating of Perceived Exertion (Exercise)  15    Symptoms   none    Duration  Continue with 45 min of aerobic exercise without signs/symptoms of physical distress.    Intensity  THRR unchanged      Progression   Progression  Continue to progress workloads to maintain intensity without signs/symptoms of physical distress.      Resistance Training   Training Prescription  Yes    Weight  10 lbs    Reps  10-15      Interval Training   Interval Training  Yes    Equipment  Elliptical;REL-XR      Treadmill   MPH  3.3    Grade  2.5    Minutes  15    METs  4.66      Elliptical   Level  2    Speed  4.3    Minutes  15      Home Exercise Plan   Plans to continue exercise at  Home (comment) walk, bike (mountain  bike)    Frequency  Add 2 additional days to program exercise sessions.    Initial Home Exercises Provided  10/11/17       Functional Capacity: 6 Minute Walk    Row Name 09/10/17 1445         6 Minute Walk   Phase  Initial     Distance  1333 feet     Walk Time  6 minutes     # of Rest Breaks  0     MPH  2.52     METS  4.32     RPE  7     VO2 Peak  15.11     Symptoms  No     Resting HR  89 bpm     Resting BP  146/74     Resting Oxygen Saturation   99 %     Exercise Oxygen Saturation  during 6 min walk  98 %     Max Ex. HR  115 bpm     Max Ex. BP  124/70     2 Minute Post BP  116/62        Psychological, QOL, Others - Outcomes: PHQ 2/9: Depression screen [facility] 2/9 10/15/2017 09/10/2017 09/10/2017  Decreased Interest 1 - 2  Down, Depressed, Hopeless 0 - 2  PHQ - 2 Score 1 - 4  Altered sleeping 0 - 2  Tired, decreased energy 1 - 3  Change in appetite 0 - 0  Feeling bad or failure about yourself  0 - 2  Trouble concentrating 1 - 2  Moving slowly or fidgety/restless 0 - 0  Suicidal thoughts 0 (No Data) 1  PHQ-9 Score 3 - 14  Difficult doing work/chores - - Somewhat difficult    Quality of Life: Quality of Life - 09/10/17 1248      Quality of Life Scores   Health/Function Pre  12 %    Socioeconomic Pre  19.57 %     Psych/Spiritual Pre  10.14 %    Family Pre  24 %    GLOBAL Pre  14.94 %       Personal Goals: Goals established at orientation with interventions provided to work toward goal. Personal Goals and Risk Factors at Admission - 09/10/17 1257      Core Components/Risk Factors/Patient Goals on Admission    Weight Management  Yes;Weight Loss    Intervention  Weight Management: Develop a combined nutrition and exercise program designed to reach desired caloric intake, while maintaining appropriate intake of nutrient and fiber, sodium and fats, and appropriate energy expenditure required for the weight goal.;Weight Management: Provide education and appropriate resources to help participant work on and attain dietary goals.;Weight Management/Obesity: Establish reasonable short term and long term weight goals.;Obesity: Provide education and appropriate resources to help participant work on and attain dietary goals.    Admit Weight  218 lb 3.2 oz (99 kg)    Goal Weight: Short Term  213 lb (96.6 kg)    Goal Weight: Long Term  200 lb (90.7 kg)    Expected Outcomes  Short Term: Continue to assess and modify interventions until short term weight is achieved;Weight Loss: Understanding of general recommendations for a balanced deficit meal plan, which promotes 1-2 lb weight loss per week and includes a negative energy balance of [phone] kcal/d;Understanding recommendations for meals to include 15-35% energy as protein, 25-35% energy from fat, 35-60% energy from carbohydrates, less than 200mg  of dietary cholesterol, 20-35 gm of total fiber  daily;Understanding of distribution of calorie intake throughout the day with the consumption of 4-5 meals/snacks;Long Term: Adherence to nutrition and physical activity/exercise program aimed toward attainment of established weight goal    Hypertension  Yes    Intervention  Provide education on lifestyle modifcations including regular physical activity/exercise, weight  management, moderate sodium restriction and increased consumption of fresh fruit, vegetables, and low fat dairy, alcohol moderation, and smoking cessation.;Monitor prescription use compliance.    Expected Outcomes  Short Term: Continued assessment and intervention until BP is < 140/80mm HG in hypertensive participants. < 130/78mm HG in hypertensive participants with diabetes, heart failure or chronic kidney disease.;Long Term: Maintenance of blood pressure at goal levels.    Lipids  Yes    Intervention  Provide education and support for participant on nutrition & aerobic/resistive exercise along with prescribed medications to achieve LDL 70mg , HDL >40mg .    Expected Outcomes  Short Term: Participant states understanding of desired cholesterol values and is compliant with medications prescribed. Participant is following exercise prescription and nutrition guidelines.;Long Term: Cholesterol controlled with medications as prescribed, with individualized exercise RX and with personalized nutrition plan. Value goals: LDL < 70mg , HDL > 40 mg.    Stress  Yes    Intervention  Offer individual and/or small group education and counseling on adjustment to heart disease, stress management and health-related lifestyle change. Teach and support self-help strategies.;Refer participants experiencing significant psychosocial distress to appropriate mental health specialists for further evaluation and treatment. When possible, include family members and significant others in education/counseling sessions.    Expected Outcomes  Short Term: Participant demonstrates changes in health-related behavior, relaxation and other stress management skills, ability to obtain effective social support, and compliance with psychotropic medications if prescribed.;Long Term: Emotional wellbeing is indicated by absence of clinically significant psychosocial distress or social isolation.        Personal Goals Discharge: Goals and Risk  Factor Review    Row Name 10/11/17 1629 11/08/17 1706           Core Components/Risk Factors/Patient Goals Review   Personal Goals Review  Stress  Stress      Review  Daysen states his stress is still up at work.  He is using exrecise to help.  Staff reviewed realxation breathing as another tool to help.  Mariusz had his stress MRI yesterday and said the injection was uncomfortable. Stephanos said since he has started Cardiac REhab his throat pain (his angina equilivant) has gone from an average of 8/10 to 3/10 and some days does not have it when he exercises.       Expected Outcomes  Short - Dimitris will continue to exercise and will add deep breathing to help with stress.  Long - Vail will manage his stress with exercise and breathing techniques.  Stress management with exercise and talking with our mental health counselor and taking an antidepressant.          Exercise Goals and Review: Exercise Goals    Row Name 09/10/17 1453             Exercise Goals   Increase Physical Activity  Yes       Intervention  Provide advice, education, support and counseling about physical activity/exercise needs.;Develop an individualized exercise prescription for aerobic and resistive training based on initial evaluation findings, risk stratification, comorbidities and participant's personal goals.       Expected Outcomes  Achievement of increased cardiorespiratory fitness and enhanced flexibility, muscular endurance and strength shown through  measurements of functional capacity and personal statement of participant.       Increase Strength and Stamina  Yes       Intervention  Provide advice, education, support and counseling about physical activity/exercise needs.;Develop an individualized exercise prescription for aerobic and resistive training based on initial evaluation findings, risk stratification, comorbidities and participant's personal goals.       Expected Outcomes  Achievement of increased  cardiorespiratory fitness and enhanced flexibility, muscular endurance and strength shown through measurements of functional capacity and personal statement of participant.       Able to understand and use rate of perceived exertion (RPE) scale  Yes       Intervention  Provide education and explanation on how to use RPE scale       Expected Outcomes  Short Term: Able to use RPE daily in rehab to express subjective intensity level;Long Term:  Able to use RPE to guide intensity level when exercising independently       Knowledge and understanding of Target Heart Rate Range (THRR)  Yes       Intervention  Provide education and explanation of THRR including how the numbers were predicted and where they are located for reference       Expected Outcomes  Short Term: Able to state/look up THRR;Long Term: Able to use THRR to govern intensity when exercising independently;Short Term: Able to use daily as guideline for intensity in rehab       Able to check pulse independently  Yes       Intervention  Provide education and demonstration on how to check pulse in carotid and radial arteries.;Review the importance of being able to check your own pulse for safety during independent exercise       Expected Outcomes  Short Term: Able to explain why pulse checking is important during independent exercise;Long Term: Able to check pulse independently and accurately       Understanding of Exercise Prescription  Yes       Intervention  Provide education, explanation, and written materials on patient's individual exercise prescription       Expected Outcomes  Long Term: Able to explain home exercise prescription to exercise independently;Short Term: Able to explain program exercise prescription          Nutrition & Weight - Outcomes: Pre Biometrics - 09/10/17 1454      Pre Biometrics   Height  6' 1.25" (1.861 m)    Weight  218 lb 3.2 oz (99 kg)    Waist Circumference  40 inches    Hip Circumference  39.75 inches     Waist to Hip Ratio  1.01 %    BMI (Calculated)  28.58    Single Leg Stand  30 seconds        Nutrition: Nutrition Therapy & Goals - 11/08/17 1708      Nutrition Therapy   RD appointment defered  Yes       Nutrition Discharge: Nutrition Assessments - 09/10/17 1247      MEDFICTS Scores   Pre Score  32       Education Questionnaire Score: Knowledge Questionnaire Score - 09/10/17 1249      Knowledge Questionnaire Score   Pre Score  27       Goals reviewed with patient; copy given to patient.

## 2017-11-23 NOTE — Telephone Encounter (Signed)
Bobby [name] stated his insurance changes tomorrow and will no longer cover HT.  He will stop by to pick up education book.

## 2017-11-26 ENCOUNTER — Encounter: Payer: 59 | Attending: Cardiology

## 2017-11-26 DIAGNOSIS — Z955 Presence of coronary angioplasty implant and graft: Secondary | ICD-10-CM | POA: Insufficient documentation

## 2017-11-26 DIAGNOSIS — I213 ST elevation (STEMI) myocardial infarction of unspecified site: Secondary | ICD-10-CM | POA: Insufficient documentation

## 2017-11-26 DIAGNOSIS — Z48812 Encounter for surgical aftercare following surgery on the circulatory system: Secondary | ICD-10-CM | POA: Insufficient documentation

## 2018-05-05 DIAGNOSIS — I208 Other forms of angina pectoris: Secondary | ICD-10-CM | POA: Insufficient documentation

## 2018-05-05 DIAGNOSIS — I2089 Other forms of angina pectoris: Secondary | ICD-10-CM | POA: Insufficient documentation

## 2018-05-14 ENCOUNTER — Ambulatory Visit: Payer: Self-pay | Admitting: Podiatry

## 2018-05-21 ENCOUNTER — Encounter: Payer: Self-pay | Admitting: Podiatry

## 2018-05-21 ENCOUNTER — Ambulatory Visit: Payer: 59 | Admitting: Podiatry

## 2018-05-21 DIAGNOSIS — M6740 Ganglion, unspecified site: Secondary | ICD-10-CM

## 2018-05-21 NOTE — Patient Instructions (Signed)
Pre-Operative Instructions  Congratulations, you have decided to take an important step towards improving your quality of life.  You can be assured that the doctors and staff at [facility] will be with you every step of the way.  Here are some important things you should know:  1. Plan to be at the surgery center/hospital at least 1 (one) hour prior to your scheduled time, unless otherwise directed by the surgical center/hospital staff.  You must have a responsible adult accompany you, remain during the surgery and drive you home.  Make sure you have directions to the surgical center/hospital to ensure you arrive on time. 2. If you are having surgery at [facility] or [facility] hospitals, you will need a copy of your medical history and physical form from your family physician within one month prior to the date of surgery. We will give you a form for your primary physician to complete.  3. We make every effort to accommodate the date you request for surgery.  However, there are times where surgery dates or times have to be moved.  We will contact you as soon as possible if a change in schedule is required.   4. No aspirin/ibuprofen for one week before surgery.  If you are on aspirin, any non-steroidal anti-inflammatory medications (Mobic, Aleve, Ibuprofen) should not be taken seven (7) days prior to your surgery.  You make take Tylenol for pain prior to surgery.  5. Medications - If you are taking daily heart and blood pressure medications, seizure, reflux, allergy, asthma, anxiety, pain or diabetes medications, make sure you notify the surgery center/hospital before the day of surgery so they can tell you which medications you should take or avoid the day of surgery. 6. No food or drink after midnight the night before surgery unless directed otherwise by surgical center/hospital staff. 7. No alcoholic beverages 24-hours prior to surgery.  No smoking 24-hours prior or 24-hours after  surgery. 8. Wear loose pants or shorts. They should be loose enough to fit over bandages, boots, and casts. 9. Don't wear slip-on shoes. Sneakers are preferred. 10. Bring your boot with you to the surgery center/hospital.  Also bring crutches or a walker if your physician has prescribed it for you.  If you do not have this equipment, it will be provided for you after surgery. 11. If you have not been contacted by the surgery center/hospital by the day before your surgery, call to confirm the date and time of your surgery. 12. Leave-time from work may vary depending on the type of surgery you have.  Appropriate arrangements should be made prior to surgery with your employer. 13. Prescriptions will be provided immediately following surgery by your doctor.  Fill these as soon as possible after surgery and take the medication as directed. Pain medications will not be refilled on weekends and must be approved by the doctor. 14. Remove nail polish on the operative foot and avoid getting pedicures prior to surgery. 15. Wash the night before surgery.  The night before surgery wash the foot and leg well with water and the antibacterial soap provided. Be sure to pay special attention to beneath the toenails and in between the toes.  Wash for at least three (3) minutes. Rinse thoroughly with water and dry well with a towel.  Perform this wash unless told not to do so by your physician.  Enclosed: 1 Ice pack (please put in freezer the night before surgery)   1 Hibiclens skin cleaner     Pre-op instructions  If you have any questions regarding the instructions, please do not hesitate to call our office.  [place]: [address], [place], [place] [address] -- [phone]  [place]: [address] [place], [place] [address] -- [phone]  [place]: [address]  , [place] [address] -- [phone]  [place]: [address], [address], [place],  [address] -- [phone]  Website:  [email or web address] 

## 2018-05-22 NOTE — Progress Notes (Signed)
   HPI: 47 year old male presenting today as a new patient with a chief complaint of a cyst on the left 2nd toe that has been present for the past 2-3 years. He states he had the cyst removed in the past but it continues to come back. He reports some pain to the area. Patient is here for further evaluation and treatment.   Past Medical History:  Diagnosis Date  . Graves disease   . Hypertension   . MI (myocardial infarction) ([place])   . Thyroid disease    thyroid irradiated per pt     Physical Exam: General: The patient is alert and oriented x3 in no acute distress.  Dermatology: Cyst noted to the DIPJ of the left 2nd toe. Skin is warm, dry and supple bilateral lower extremities. Negative for open lesions or macerations.  Vascular: Palpable pedal pulses bilaterally. No edema or erythema noted. Capillary refill within normal limits.  Neurological: Epicritic and protective threshold grossly intact bilaterally.   Musculoskeletal Exam: Range of motion within normal limits to all pedal and ankle joints bilateral. Muscle strength 5/5 in all groups bilateral.    Assessment: 1. Mucoid cyst DIPJ 2nd left   Plan of Care:  1. Patient evaluated.   2. Today we discussed the conservative versus surgical management of the presenting pathology. The patient opts for surgical management. All possible complications and details of the procedure were explained. All patient questions were answered. No guarantees were expressed or implied. 3. Authorization for surgery was initiated today. Surgery will consist of DIPJ arthroplasty 2nd left. 4. Return to clinic one week post op.      Edrick Kins, DPM [facility]  Dr. Edrick Kins, DPM    [address],  [address]                Office [phone]  Fax [phone]

## 2019-02-20 ENCOUNTER — Ambulatory Visit (INDEPENDENT_AMBULATORY_CARE_PROVIDER_SITE_OTHER): Payer: No Typology Code available for payment source | Admitting: Cardiology

## 2019-02-20 ENCOUNTER — Encounter: Payer: Self-pay | Admitting: Cardiology

## 2019-02-20 VITALS — BP 93/76 | HR 66 | Ht 72.0 in | Wt 262.7 lb

## 2019-02-20 DIAGNOSIS — N529 Male erectile dysfunction, unspecified: Secondary | ICD-10-CM

## 2019-02-20 DIAGNOSIS — I255 Ischemic cardiomyopathy: Secondary | ICD-10-CM

## 2019-02-20 DIAGNOSIS — I2510 Atherosclerotic heart disease of native coronary artery without angina pectoris: Secondary | ICD-10-CM

## 2019-02-20 DIAGNOSIS — Z0189 Encounter for other specified special examinations: Secondary | ICD-10-CM | POA: Insufficient documentation

## 2019-02-20 DIAGNOSIS — R0989 Other specified symptoms and signs involving the circulatory and respiratory systems: Secondary | ICD-10-CM | POA: Insufficient documentation

## 2019-02-20 DIAGNOSIS — I5042 Chronic combined systolic (congestive) and diastolic (congestive) heart failure: Secondary | ICD-10-CM | POA: Insufficient documentation

## 2019-02-20 MED ORDER — IVABRADINE HCL 5 MG PO TABS: 2.5000 mg | ORAL_TABLET | Freq: Two times a day (BID) | ORAL | 3 refills | Status: DC

## 2019-02-20 MED ORDER — ASPIRIN 81 MG PO TABS: 81.0000 mg | ORAL_TABLET | Freq: Every day | ORAL | 3 refills | Status: DC

## 2019-02-20 MED ORDER — SACUBITRIL-VALSARTAN 24-26 MG PO TABS: 1.0000 | ORAL_TABLET | Freq: Two times a day (BID) | ORAL | 3 refills | Status: DC

## 2019-02-20 MED ORDER — SILDENAFIL CITRATE 100 MG PO TABS: ORAL_TABLET | ORAL | 3 refills | Status: DC

## 2019-02-20 MED ORDER — CARVEDILOL 3.125 MG PO TABS: 3.1250 mg | ORAL_TABLET | Freq: Two times a day (BID) | ORAL | 3 refills | Status: DC

## 2019-02-20 MED ORDER — EZETIMIBE 10 MG PO TABS: 10.0000 mg | ORAL_TABLET | Freq: Every day | ORAL | 3 refills | Status: DC

## 2019-02-20 MED ORDER — ROSUVASTATIN CALCIUM 20 MG PO TABS: 20.0000 mg | ORAL_TABLET | Freq: Every evening | ORAL | 3 refills | Status: DC

## 2019-02-20 NOTE — Progress Notes (Signed)
Patient is here for follow up visit.  Subjective:   Bobby [name], male    DOB: 12-07-1971, 48 y.o.   MRN: [id number]   Chief Complaint  Patient presents with  . Congestive Heart Failure  . Follow-up    41mth, Last EKG 08/21/18     HPI  48 year old Caucasian male with coronary artery disease status post anterior wall MI 06/2017 s/p successful LAD revascularization, prior RCA stent now with CTO, EF 43% on MRI in 10/2017 with no reversible ischemia.  At last visit in 07/2018, I had recommended long term (up to 3 years) DAPT with low dose Brilinta 60 mg bid. Patient had doing well without any angina symptoms.  He is here for 6 month follow up.  Since his last visit with me, he went to [place] for snowboarding trip. While he did not have any chest pain, he did have exertional dyspnea. He did notice desaturation while climbing at high altitude. He also had some injuries.  He feels that since his trip to [place], he has had a setback in his overall conditioning.  He has had some episodes of nonexertional dyspnea.  Again, continues to deny any anginal symptoms.  He is slowly starting his physical activity back.  He does not think he will benefit from repeat cardiac rehabilitation, but would like to increase his activity.     Past Medical History:  Diagnosis Date  . Graves disease   . Hypertension   . MI (myocardial infarction) ([place])   . Thyroid disease    thyroid irradiated per pt     Past Surgical History:  Procedure Laterality Date  . CORONARY ANGIOPLASTY WITH STENT PLACEMENT    . CORONARY/GRAFT ACUTE MI REVASCULARIZATION N/A 07/13/2017   Procedure: Coronary/Graft Acute MI Revascularization;  Surgeon: Adrian Prows, MD;  Location: [facility];  Service: Cardiovascular;  Laterality: N/A;  . LEFT HEART CATH AND CORONARY ANGIOGRAPHY N/A 07/13/2017   Procedure: Left Heart Cath and Coronary Angiography;  Surgeon: Adrian Prows, MD;  Location: [facility];  Service:  Cardiovascular;  Laterality: N/A;  . LEFT HEART CATH AND CORONARY ANGIOGRAPHY N/A 08/21/2017   Procedure: LEFT HEART CATH AND CORONARY ANGIOGRAPHY;  Surgeon: Nigel Mormon, MD;  Location: [facility];  Service: Cardiovascular;  Laterality: N/A;  . SKIN GRAFT    . VENTRICULAR ASSIST DEVICE INSERTION N/A 07/13/2017   Procedure: Ventricular Assist Device Insertion;  Surgeon: Adrian Prows, MD;  Location: [facility];  Service: Cardiovascular;  Laterality: N/A;     Social History   Socioeconomic History  . Marital status: Single    Spouse name: Not on file  . Number of children: 2  . Years of education: Not on file  . Highest education level: Not on file  Occupational History  . Not on file  Social Needs  . Financial resource strain: Not on file  . Food insecurity:    Worry: Not on file    Inability: Not on file  . Transportation needs:    Medical: Not on file    Non-medical: Not on file  Tobacco Use  . Smoking status: Former Smoker    Packs/day: 0.50    Types: Cigarettes    Last attempt to quit: 07/13/2017    Years since quitting: 1.6  . Smokeless tobacco: Never Used  . Tobacco comment: quit 2012  Substance and Sexual Activity  . Alcohol use: Yes    Comment: occ  . Drug use: No  .  Sexual activity: Yes  Lifestyle  . Physical activity:    Days per week: Not on file    Minutes per session: Not on file  . Stress: Not on file  Relationships  . Social connections:    Talks on phone: Not on file    Gets together: Not on file    Attends religious service: Not on file    Active member of club or organization: Not on file    Attends meetings of clubs or organizations: Not on file    Relationship status: Not on file  . Intimate partner violence:    Fear of current or ex partner: Not on file    Emotionally abused: Not on file    Physically abused: Not on file    Forced sexual activity: Not on file  Other Topics Concern  . Not on file  Social History  Narrative   Lives with fiance, working FT , disesel equip, right handed, 2 kids, college edu     Current Outpatient Medications on File Prior to Visit  Medication Sig Dispense Refill  . Cholecalciferol (VITAMIN D) 2000 units CAPS Take 2,000 Units by mouth daily.     . Coenzyme Q10 (COQ10 PO) Take by mouth daily.    Marland Kitchen FLUoxetine (PROZAC) 10 MG capsule 30 mg daily.     Marland Kitchen levothyroxine (SYNTHROID, LEVOTHROID) 137 MCG tablet Take 137 mcg by mouth daily before breakfast.    . Multiple Vitamin (MULTIVITAMIN WITH MINERALS) TABS tablet Take 1 tablet by mouth daily.    . nitroGLYCERIN (NITROSTAT) 0.4 MG SL tablet PLACE 1 TABLET (0.4 MG TOTAL) UNDER THE TONGUE EVERY 5 (FIVE) MINUTES AS NEEDED FOR CHEST PAIN.    Marland Kitchen pantoprazole (PROTONIX) 40 MG tablet TAKE 1 TABLET BY MOUTH EVERY DAY    . Vitamins/Minerals TABS Take by mouth.    . levothyroxine (SYNTHROID, LEVOTHROID) 137 MCG tablet TAKE 1 TABLET BY MOUTH EVERY DAY     No current facility-administered medications on file prior to visit.     Cardiovascular studies:   EKG 02/20/2019: Sinus rhythm 64 bpm. Old anteroseptal infarct.   Stress perfusion MRI 11/07/2017: 1. Moderately dilated left ventricle with normal wall thickness and moderately decreased systolic function (LVEF = 43%). There is hypokinesis of the basal anteroseptal, anterior walls and akinesis of the mid inferoseptal, anteroseptal, anterior, all apical walls including true apex.   A large infarct in the basal and mid inferior, inferoseptal, anteroseptal, anterior walls and in all the apical walls with no ischemia.   2. Normal right ventricular size, thickness and systolic function (LVEF = 56%). There are no regional wall motion abnormalities.   3.  Mildly dilated left atrium.   4. Mild mitral and tricuspid regurgitation.   Collectively, these findings are consistent with an ischemic cardiomyopathy with a large infarct in the LAD and RCA territory with no peri-infarct  ischemia. There is poor chance of recovery if Revascularized.  Cath 08/21/2017: LM: Normal LAD: Patent ostial LAD stent. 20% ostial diagonal stenosis Ramus: 50% proximal stenosis RCA: Not engaged today. Known RCA CTO   Elevated LVEDP LVEF 45%   Continue aggressive medical therapy  Coronary angiogram 07/13/2017: Elevated LVEDP at 24 mmHg, systemic pressures 80/60 mmHg, patient in cardiogenic shock. Left main normal, LAD flush occluded. Ramus intermediate mid 70-80% % stenosis, mild disease in the circumflex. RCA occluded at the prior angioplasty site. Contralateral collaterals from the LAD.    Large LAD and diagonal, successful angioplasty and stenting following thrombectomy with implantation  of 3.5 x 26 mm resolute Onyx DES deployed at 14 atmospheric pressure. Diagonal 1 ostial stenosis balloon angioplasty performed with 2.5 mm balloon at 10 atmospheric pressure. Stenosis reduced to 0%, mid flow improved from 0-3.  Carotid artery duplex 10/05/2017: No hemodynamically significant arterial disease in the internal carotid artery bilaterally. Normal study.  Antegrade right vertebral artery flow. Antegrade left vertebral artery flow.  Review of Systems  Constitution: Positive for malaise/fatigue. Negative for decreased appetite, weight gain and weight loss.  HENT: Negative for congestion.   Eyes: Negative for visual disturbance.  Cardiovascular: Negative for chest pain, claudication, dyspnea on exertion, leg swelling, palpitations and syncope.  Respiratory: Negative for shortness of breath (Non exertional).   Endocrine: Negative for cold intolerance.  Hematologic/Lymphatic: Does not bruise/bleed easily.  Skin: Negative for itching and rash.  Musculoskeletal: Negative for myalgias.  Gastrointestinal: Negative for abdominal pain, nausea and vomiting.  Genitourinary: Negative for dysuria.  Neurological: Negative for dizziness and weakness.  Psychiatric/Behavioral: The patient is not  nervous/anxious.   All other systems reviewed and are negative.      Objective:    Vitals:   02/20/19 1410  BP: 93/76  Pulse: 66  SpO2: 98%     Physical Exam  Constitutional: He is oriented to person, place, and time. He appears well-developed and well-nourished. No distress.  HENT:  Head: Normocephalic and atraumatic.  Eyes: Pupils are equal, round, and reactive to light. Conjunctivae are normal.  Neck: No JVD present.  Cardiovascular: Normal rate, regular rhythm and intact distal pulses.  No murmur heard. Pulmonary/Chest: Effort normal and breath sounds normal. He has no wheezes. He has no rales.  Abdominal: Soft. Bowel sounds are normal. There is no rebound.  Musculoskeletal:        General: No edema.  Lymphadenopathy:    He has no cervical adenopathy.  Neurological: He is alert and oriented to person, place, and time. No cranial nerve deficit.  Skin: Skin is warm and dry.  Psychiatric: He has a normal mood and affect.  Nursing note and vitals reviewed.       Assessment & Recommendations:   48 year old Caucasian male with coronary artery disease status post anterior wall MI 06/2017 s/p successful LAD revascularization, prior RCA stent now with CTO, EF 43% on MRI in 10/2017 with no reversible ischemia.  1. Ischemic cardiomyopathy Clinically stable.  NYHA class I-2 symptoms.  Continue current guideline directed medical therapy.  Up titration limited due to asymptomatic but historically low blood pressure. Continue carvedilol 3.125 mg twice daily, Entresto 24-26 mg twice daily, ivabradine 2.5 mg twice daily.  Refilled all his heart failure medications.  I do not think his shortness of breath is related to congestive heart failure.  If symptoms do not improve after stopping Brilinta and slowly increasing physical activity, may repeat echocardiogram in a few months.  2. Coronary artery disease involving native coronary artery of native heart without angina pectoris No  anginal symptoms.  Continue aspirin 81 mg daily.  He has completed 1 year of dual antiplatelet therapy after his ACS.  While he had previously continued Brilinta 60 mg twice daily, given his long exertional shortness of breath, it is reasonable to stop Brilinta and see if his symptoms improve.  3. Erectile dysfunction, unspecified erectile dysfunction type I have cautiously prescribed him sildenafil.  Patient is aware not to use sildenafil concurrently with any nitrate medications.  I will see him back in 3 months.   Nigel Mormon, MD [facility]  Pager: [phone] Office: [phone] If no answer Cell [phone]   

## 2019-02-22 ENCOUNTER — Encounter: Payer: Self-pay | Admitting: Cardiology

## 2019-04-23 IMAGING — DX DG CHEST 1V PORT
1 series · 1 of 1 positions shown · non-contrast
Comparison: CXR 03/15/2012

CLINICAL DATA: Chest pain and diaphoretic.

EXAM:
PORTABLE CHEST 1 VIEW

[chest]
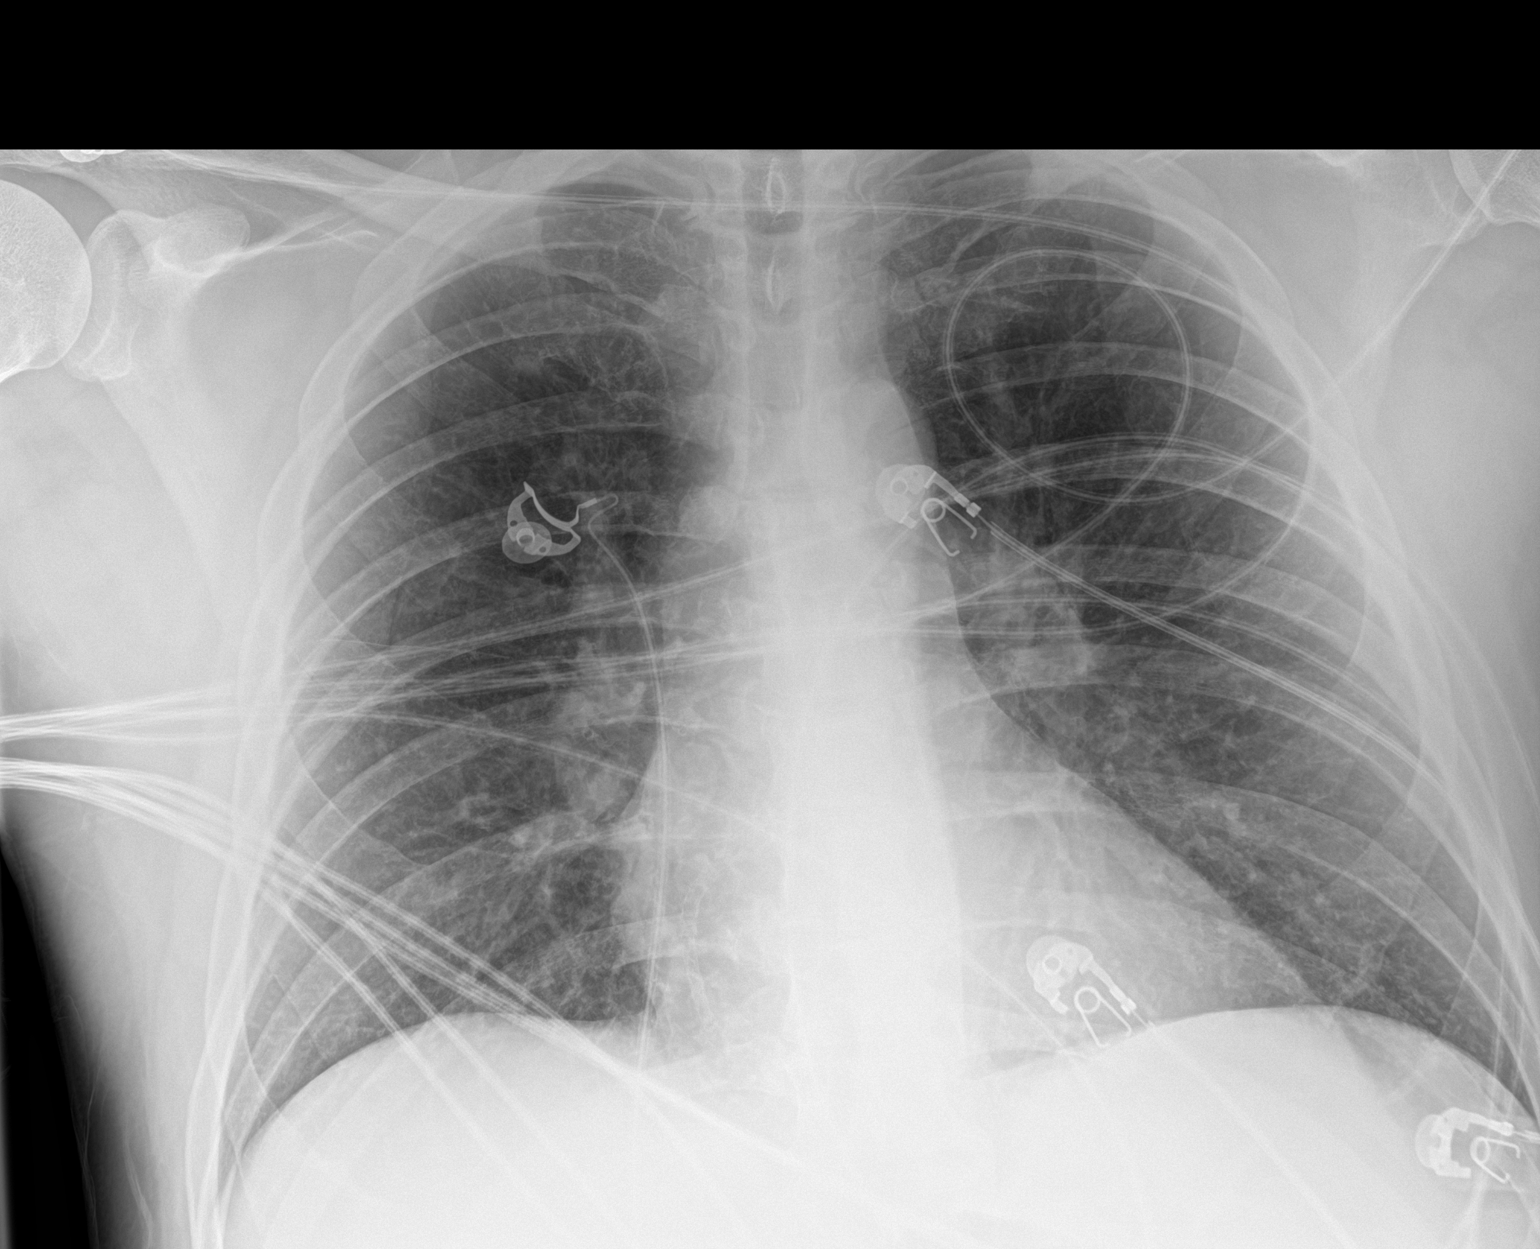

[1 of 1 positions shown; findings below may reference images not displayed]

FINDINGS: The heart size and mediastinal contours are within normal limits.
Stable mild hilar prominence believed to be vascular in etiology
since 5723. Both lungs are clear. The visualized skeletal structures
are unremarkable.
IMPRESSION: No active disease.

## 2019-05-21 ENCOUNTER — Other Ambulatory Visit: Payer: Self-pay

## 2019-05-21 ENCOUNTER — Encounter: Payer: Self-pay | Admitting: Cardiology

## 2019-05-21 ENCOUNTER — Ambulatory Visit (INDEPENDENT_AMBULATORY_CARE_PROVIDER_SITE_OTHER): Payer: No Typology Code available for payment source | Admitting: Cardiology

## 2019-05-21 VITALS — BP 138/88 | HR 71 | Ht 73.0 in | Wt 230.0 lb

## 2019-05-21 DIAGNOSIS — I2510 Atherosclerotic heart disease of native coronary artery without angina pectoris: Secondary | ICD-10-CM | POA: Diagnosis not present

## 2019-05-21 DIAGNOSIS — M791 Myalgia, unspecified site: Secondary | ICD-10-CM | POA: Diagnosis not present

## 2019-05-21 HISTORY — DX: Myalgia, unspecified site: M79.10

## 2019-05-21 MED ORDER — ROSUVASTATIN CALCIUM 20 MG PO TABS: 20.0000 mg | ORAL_TABLET | Freq: Every evening | ORAL | 3 refills | Status: DC

## 2019-05-21 MED ORDER — ATORVASTATIN CALCIUM 20 MG PO TABS: 20.0000 mg | ORAL_TABLET | Freq: Every day | ORAL | 2 refills | Status: DC

## 2019-05-21 NOTE — Progress Notes (Signed)
Patient is here for follow up visit.  Subjective:   Bobby [name], male    DOB: [birth year], 47 y.o.   MRN: [id number]   No chief complaint on file.  I connected withthe patient on 05/27/20by a video enabled telemedicine application and verified that I am speaking with the correct person using two identifiers.    I discussed the limitations of evaluation and management by telemedicine and the availability of in person appointments. The patient expressed understanding and agreed to proceed.   This visit type was conducted due to national recommendations for restrictions regarding the COVID-19 Pandemic (e.g. social distancing). This format is felt to be most appropriate for this patient at this time. All issues noted in this document were discussed and addressed. No physical exam was performed (except for noted visual exam findings with Tele health visits). The patient has consented to conduct a Tele health visit and understands insurance will be billed.   HPI  48 year old Caucasian male with coronary artery disease status post anterior wall MI 06/2017 s/p successful LAD revascularization, prior RCA stent now with CTO, EF 43% on MRI in 10/2017 with no reversible ischemia.  He has been having complaints of myalgias and fatigue. Incidentally, he was out of his crestor for a few days. During this period, his symptoms completely resolved. Otherwise, he denies any chest pain, shortness of breath symptoms.   His BP is unusually high for him today. He denies any complaints.   Past Medical History:  Diagnosis Date  . Acute MI anterior wall first episode care [facility]) 07/13/2017   Occluded ostial LAD S/P 3.5x26 Onyx DES. RI 80%. RCA Occluded at prior stent site with collaterals   . Cardiogenic shock ([place]) 07/13/2017  . Graves disease   . Hypertension   . MI (myocardial infarction) ([place])   . Thyroid disease    thyroid irradiated per pt     Past Surgical History:  Procedure Laterality  Date  . CORONARY ANGIOPLASTY WITH STENT PLACEMENT    . CORONARY/GRAFT ACUTE MI REVASCULARIZATION N/A 07/13/2017   Procedure: Coronary/Graft Acute MI Revascularization;  Surgeon: Adrian Prows, MD;  Location: [place] [facility];  Service: Cardiovascular;  Laterality: N/A;  . LEFT HEART CATH AND CORONARY ANGIOGRAPHY N/A 07/13/2017   Procedure: Left Heart Cath and Coronary Angiography;  Surgeon: Adrian Prows, MD;  Location: [place] [facility];  Service: Cardiovascular;  Laterality: N/A;  . LEFT HEART CATH AND CORONARY ANGIOGRAPHY N/A 08/21/2017   Procedure: LEFT HEART CATH AND CORONARY ANGIOGRAPHY;  Surgeon: Nigel Mormon, MD;  Location: [place] [facility];  Service: Cardiovascular;  Laterality: N/A;  . SKIN GRAFT    . VENTRICULAR ASSIST DEVICE INSERTION N/A 07/13/2017   Procedure: Ventricular Assist Device Insertion;  Surgeon: Adrian Prows, MD;  Location: [place] [facility];  Service: Cardiovascular;  Laterality: N/A;     Social History   Socioeconomic History  . Marital status: Single    Spouse name: Not on file  . Number of children: 2  . Years of education: Not on file  . Highest education level: Not on file  Occupational History  . Not on file  Social Needs  . Financial resource strain: Not on file  . Food insecurity:    Worry: Not on file    Inability: Not on file  . Transportation needs:    Medical: Not on file    Non-medical: Not on file  Tobacco Use  . Smoking status: Former Smoker    Packs/day:  0.50    Types: Cigarettes    Last attempt to quit: 07/13/2017    Years since quitting: 1.8  . Smokeless tobacco: Never Used  . Tobacco comment: quit 2012  Substance and Sexual Activity  . Alcohol use: Yes    Comment: occ  . Drug use: No  . Sexual activity: Yes  Lifestyle  . Physical activity:    Days per week: Not on file    Minutes per session: Not on file  . Stress: Not on file  Relationships  . Social connections:    Talks on phone: Not on file    Gets together: Not on  file    Attends religious service: Not on file    Active member of club or organization: Not on file    Attends meetings of clubs or organizations: Not on file    Relationship status: Not on file  . Intimate partner violence:    Fear of current or ex partner: Not on file    Emotionally abused: Not on file    Physically abused: Not on file    Forced sexual activity: Not on file  Other Topics Concern  . Not on file  Social History Narrative   Lives with fiance, working FT , disesel equip, right handed, 2 kids, college edu     Current Outpatient Medications on File Prior to Visit  Medication Sig Dispense Refill  . aspirin 81 MG tablet Take 1 tablet (81 mg total) by mouth daily. 90 tablet 3  . carvedilol (COREG) 3.125 MG tablet Take 1 tablet (3.125 mg total) by mouth 2 (two) times daily. 90 tablet 3  . Cholecalciferol (VITAMIN D) 2000 units CAPS Take 2,000 Units by mouth daily.     . Coenzyme Q10 (COQ10 PO) Take by mouth daily.    Marland Kitchen ezetimibe (ZETIA) 10 MG tablet Take 1 tablet (10 mg total) by mouth daily. 90 tablet 3  . FLUoxetine (PROZAC) 10 MG capsule 30 mg daily.     . ivabradine (CORLANOR) 5 MG TABS tablet Take 0.5 tablets (2.5 mg total) by mouth 2 (two) times daily. 90 tablet 3  . levothyroxine (SYNTHROID, LEVOTHROID) 137 MCG tablet Take 137 mcg by mouth daily before breakfast.    . levothyroxine (SYNTHROID, LEVOTHROID) 137 MCG tablet TAKE 1 TABLET BY MOUTH EVERY DAY    . Multiple Vitamin (MULTIVITAMIN WITH MINERALS) TABS tablet Take 1 tablet by mouth daily.    . nitroGLYCERIN (NITROSTAT) 0.4 MG SL tablet PLACE 1 TABLET (0.4 MG TOTAL) UNDER THE TONGUE EVERY 5 (FIVE) MINUTES AS NEEDED FOR CHEST PAIN.    Marland Kitchen pantoprazole (PROTONIX) 40 MG tablet TAKE 1 TABLET BY MOUTH EVERY DAY    . sacubitril-valsartan (ENTRESTO) 24-26 MG Take 1 tablet by mouth 2 (two) times daily. 180 tablet 3  . sildenafil (VIAGRA) 100 MG tablet TAKE 1 (ONE) TABLET AS NEEDED. DO NOT USE AT SAME TIME AS NITRO 30 tablet  3  . Vitamins/Minerals TABS Take by mouth.     No current facility-administered medications on file prior to visit.     Cardiovascular studies:   EKG 02/20/2019: Sinus rhythm 64 bpm. Old anteroseptal infarct.   Stress perfusion MRI 11/07/2017: 1. Moderately dilated left ventricle with normal wall thickness and moderately decreased systolic function (LVEF = 43%). There is hypokinesis of the basal anteroseptal, anterior walls and akinesis of the mid inferoseptal, anteroseptal, anterior, all apical walls including true apex.   A large infarct in the basal and mid inferior, inferoseptal, anteroseptal,  anterior walls and in all the apical walls with no ischemia.   2. Normal right ventricular size, thickness and systolic function (LVEF = 56%). There are no regional wall motion abnormalities.   3.  Mildly dilated left atrium.   4. Mild mitral and tricuspid regurgitation.   Collectively, these findings are consistent with an ischemic cardiomyopathy with a large infarct in the LAD and RCA territory with no peri-infarct ischemia. There is poor chance of recovery if Revascularized.  Cath 08/21/2017: LM: Normal LAD: Patent ostial LAD stent. 20% ostial diagonal stenosis Ramus: 50% proximal stenosis RCA: Not engaged today. Known RCA CTO   Elevated LVEDP LVEF 45%   Continue aggressive medical therapy  Coronary angiogram 07/13/2017: Elevated LVEDP at 24 mmHg, systemic pressures 80/60 mmHg, patient in cardiogenic shock. Left main normal, LAD flush occluded. Ramus intermediate mid 70-80% % stenosis, mild disease in the circumflex. RCA occluded at the prior angioplasty site. Contralateral collaterals from the LAD.    Large LAD and diagonal, successful angioplasty and stenting following thrombectomy with implantation of 3.5 x 26 mm resolute Onyx DES deployed at 14 atmospheric pressure. Diagonal 1 ostial stenosis balloon angioplasty performed with 2.5 mm balloon at 10 atmospheric  pressure. Stenosis reduced to 0%, mid flow improved from 0-3.  Carotid artery duplex 10/05/2017: No hemodynamically significant arterial disease in the internal carotid artery bilaterally. Normal study.  Antegrade right vertebral artery flow. Antegrade left vertebral artery flow.  Review of Systems  Constitution: Positive for malaise/fatigue. Negative for decreased appetite, weight gain and weight loss.  HENT: Negative for congestion.   Eyes: Negative for visual disturbance.  Cardiovascular: Negative for chest pain, claudication, dyspnea on exertion, leg swelling, palpitations and syncope.  Respiratory: Negative for shortness of breath (Non exertional).   Endocrine: Negative for cold intolerance.  Hematologic/Lymphatic: Does not bruise/bleed easily.  Skin: Negative for itching and rash.  Musculoskeletal: Negative for myalgias.  Gastrointestinal: Negative for abdominal pain, nausea and vomiting.  Genitourinary: Negative for dysuria.  Neurological: Negative for dizziness and weakness.  Psychiatric/Behavioral: The patient is not nervous/anxious.   All other systems reviewed and are negative.      Objective:    Vitals:   05/21/19 1437  BP: 138/88  Pulse: 71     Physical Exam  Constitutional: He is oriented to person, place, and time. He appears well-developed and well-nourished. No distress.  HENT:  Head: Normocephalic and atraumatic.  Eyes: Pupils are equal, round, and reactive to light. Conjunctivae are normal.  Neck: No JVD present.  Cardiovascular: Normal rate, regular rhythm and intact distal pulses.  No murmur heard. Pulmonary/Chest: Effort normal and breath sounds normal. He has no wheezes. He has no rales.  Abdominal: Soft. Bowel sounds are normal. There is no rebound.  Musculoskeletal:        General: No edema.  Lymphadenopathy:    He has no cervical adenopathy.  Neurological: He is alert and oriented to person, place, and time. No cranial nerve deficit.  Skin:  Skin is warm and dry.  Psychiatric: He has a normal mood and affect.  Nursing note and vitals reviewed.       Assessment & Recommendations:   48 year old Caucasian male with coronary artery disease status post anterior wall MI 06/2017 s/p successful LAD revascularization, prior RCA stent now with CTO, EF 43% on MRI in 10/2017 with no reversible ischemia.  1. Ischemic cardiomyopathy Clinically stable.  NYHA class I-2 symptoms.  Continue current guideline directed medical therapy.  Up titration historically limited due to  asymptomatic but low blood pressure. That said, BP is high today. This may be an outlier., I have asked him to monitor his BP. IF stays >130/80 mmHg, we could uptitrate therapy in the future. For now, continue carvedilol 3.125 mg twice daily, Entresto 24-26 mg twice daily, ivabradine 2.5 mg twice daily.    2. Coronary artery disease involving native coronary artery of native heart without angina pectoris No anginal symptoms.  Continue aspirin 81 mg daily. See below re: Statin therapy  3. Myalgias: Likely related to Crestor. Switch to lipitor. Recheck lipid panel in 3 months.    I will see him back in 3 months.   Nigel Mormon, MD [facility] Pager: [phone] Office: [phone] If no answer Cell [phone]

## 2019-07-30 ENCOUNTER — Telehealth: Payer: Self-pay

## 2019-08-22 ENCOUNTER — Other Ambulatory Visit: Payer: Self-pay

## 2019-08-22 DIAGNOSIS — I2510 Atherosclerotic heart disease of native coronary artery without angina pectoris: Secondary | ICD-10-CM

## 2019-08-22 MED ORDER — ATORVASTATIN CALCIUM 20 MG PO TABS: 20.0000 mg | ORAL_TABLET | Freq: Every day | ORAL | 0 refills | Status: DC

## 2019-09-11 ENCOUNTER — Other Ambulatory Visit: Payer: Self-pay

## 2019-09-11 DIAGNOSIS — Z20822 Contact with and (suspected) exposure to covid-19: Secondary | ICD-10-CM

## 2019-09-13 LAB — NOVEL CORONAVIRUS, NAA: SARS-CoV-2, NAA: NOT DETECTED

## 2019-09-16 ENCOUNTER — Other Ambulatory Visit: Payer: Self-pay

## 2019-09-16 DIAGNOSIS — Z20822 Contact with and (suspected) exposure to covid-19: Secondary | ICD-10-CM

## 2019-09-17 LAB — NOVEL CORONAVIRUS, NAA: SARS-CoV-2, NAA: NOT DETECTED

## 2019-12-13 ENCOUNTER — Other Ambulatory Visit: Payer: Self-pay | Admitting: Cardiology

## 2019-12-13 DIAGNOSIS — I2510 Atherosclerotic heart disease of native coronary artery without angina pectoris: Secondary | ICD-10-CM

## 2020-01-22 ENCOUNTER — Other Ambulatory Visit: Payer: Self-pay | Admitting: Cardiology

## 2020-01-22 DIAGNOSIS — I255 Ischemic cardiomyopathy: Secondary | ICD-10-CM

## 2020-02-07 DIAGNOSIS — Z03818 Encounter for observation for suspected exposure to other biological agents ruled out: Secondary | ICD-10-CM | POA: Diagnosis not present

## 2020-02-07 DIAGNOSIS — Z20828 Contact with and (suspected) exposure to other viral communicable diseases: Secondary | ICD-10-CM | POA: Diagnosis not present

## 2020-02-10 DIAGNOSIS — Z20828 Contact with and (suspected) exposure to other viral communicable diseases: Secondary | ICD-10-CM | POA: Diagnosis not present

## 2020-02-10 DIAGNOSIS — Z03818 Encounter for observation for suspected exposure to other biological agents ruled out: Secondary | ICD-10-CM | POA: Diagnosis not present

## 2020-02-23 ENCOUNTER — Telehealth: Payer: Self-pay

## 2020-02-23 ENCOUNTER — Other Ambulatory Visit: Payer: Self-pay

## 2020-02-23 DIAGNOSIS — I2510 Atherosclerotic heart disease of native coronary artery without angina pectoris: Secondary | ICD-10-CM

## 2020-02-23 NOTE — Telephone Encounter (Signed)
Patient's wife called re: labs. She wanted to know if he needed other labs done, besides the lipid panel.

## 2020-02-23 NOTE — Addendum Note (Signed)
Addended by: Nigel Mormon on: 02/23/2020 01:55 PM   Modules accepted: Orders

## 2020-02-23 NOTE — Telephone Encounter (Signed)
Yes. Lesley/Tara, please place CBC, BMP orders for CAD. He has not had any recent labs.  Thanks MJP

## 2020-02-23 NOTE — Telephone Encounter (Signed)
Done

## 2020-02-24 ENCOUNTER — Other Ambulatory Visit: Payer: Self-pay | Admitting: Cardiology

## 2020-02-24 DIAGNOSIS — I2510 Atherosclerotic heart disease of native coronary artery without angina pectoris: Secondary | ICD-10-CM

## 2020-02-25 ENCOUNTER — Other Ambulatory Visit: Payer: Self-pay | Admitting: Cardiology

## 2020-02-25 ENCOUNTER — Other Ambulatory Visit: Payer: Self-pay

## 2020-02-25 DIAGNOSIS — I255 Ischemic cardiomyopathy: Secondary | ICD-10-CM

## 2020-02-25 DIAGNOSIS — I2510 Atherosclerotic heart disease of native coronary artery without angina pectoris: Secondary | ICD-10-CM

## 2020-02-25 MED ORDER — IVABRADINE HCL 5 MG PO TABS: 2.5000 mg | ORAL_TABLET | Freq: Two times a day (BID) | ORAL | 3 refills | Status: DC

## 2020-02-28 ENCOUNTER — Other Ambulatory Visit: Payer: Self-pay | Admitting: Cardiology

## 2020-02-28 DIAGNOSIS — I2510 Atherosclerotic heart disease of native coronary artery without angina pectoris: Secondary | ICD-10-CM

## 2020-02-29 ENCOUNTER — Other Ambulatory Visit: Payer: Self-pay | Admitting: Cardiology

## 2020-02-29 DIAGNOSIS — N529 Male erectile dysfunction, unspecified: Secondary | ICD-10-CM

## 2020-02-29 DIAGNOSIS — I255 Ischemic cardiomyopathy: Secondary | ICD-10-CM

## 2020-03-04 ENCOUNTER — Telehealth: Payer: Self-pay

## 2020-03-16 DIAGNOSIS — I2510 Atherosclerotic heart disease of native coronary artery without angina pectoris: Secondary | ICD-10-CM | POA: Diagnosis not present

## 2020-03-17 LAB — BASIC METABOLIC PANEL
BUN/Creatinine Ratio: 15 (ref 9–20)
BUN: 15 mg/dL (ref 6–24)
CO2: 21 mmol/L (ref 20–29)
Calcium: 9.4 mg/dL (ref 8.7–10.2)
Chloride: 105 mmol/L (ref 96–106)
Creatinine, Ser: 1.01 mg/dL (ref 0.76–1.27)
GFR calc Af Amer: 100 mL/min/{1.73_m2} (ref >59)
GFR calc non Af Amer: 86 mL/min/{1.73_m2} (ref >59)
Glucose: 121 mg/dL — ABNORMAL HIGH (ref 65–99)
Potassium: 4.2 mmol/L (ref 3.5–5.2)
Sodium: 141 mmol/L (ref 134–144)

## 2020-03-17 LAB — CBC WITH DIFFERENTIAL/PLATELET
Basophils Absolute: 0.1 10*3/uL (ref 0.0–0.2)
Basos: 1 %
EOS (ABSOLUTE): 0.2 10*3/uL (ref 0.0–0.4)
Eos: 3 %
Hematocrit: 45.4 % (ref 37.5–51.0)
Hemoglobin: 15.3 g/dL (ref 13.0–17.7)
Immature Grans (Abs): 0 10*3/uL (ref 0.0–0.1)
Immature Granulocytes: 0 %
Lymphocytes Absolute: 2.1 10*3/uL (ref 0.7–3.1)
Lymphs: 27 %
MCH: 31 pg (ref 26.6–33.0)
MCHC: 33.7 g/dL (ref 31.5–35.7)
MCV: 92 fL (ref 79–97)
Monocytes Absolute: 0.6 10*3/uL (ref 0.1–0.9)
Monocytes: 8 %
Neutrophils Absolute: 4.8 10*3/uL (ref 1.4–7.0)
Neutrophils: 61 %
Platelets: 206 10*3/uL (ref 150–450)
RBC: 4.94 x10E6/uL (ref 4.14–5.80)
RDW: 13.4 % (ref 11.6–15.4)
WBC: 7.8 10*3/uL (ref 3.4–10.8)

## 2020-03-17 LAB — LIPID PANEL W/O CHOL/HDL RATIO
Cholesterol, Total: 165 mg/dL (ref 100–199)
HDL: 39 mg/dL — ABNORMAL LOW (ref >39)
LDL Chol Calc (NIH): 74 mg/dL (ref 0–99)
Triglycerides: 325 mg/dL — ABNORMAL HIGH (ref 0–149)
VLDL Cholesterol Cal: 52 mg/dL — ABNORMAL HIGH (ref 5–40)

## 2020-03-18 ENCOUNTER — Ambulatory Visit: Payer: BC Managed Care – PPO | Admitting: Cardiology

## 2020-03-18 ENCOUNTER — Encounter: Payer: Self-pay | Admitting: Cardiology

## 2020-03-18 ENCOUNTER — Other Ambulatory Visit: Payer: Self-pay

## 2020-03-18 VITALS — BP 111/73 | HR 68 | Temp 98.7°F | Resp 18 | Ht 73.0 in | Wt 234.0 lb

## 2020-03-18 DIAGNOSIS — R0683 Snoring: Secondary | ICD-10-CM | POA: Diagnosis not present

## 2020-03-18 DIAGNOSIS — I4891 Unspecified atrial fibrillation: Secondary | ICD-10-CM | POA: Diagnosis not present

## 2020-03-18 DIAGNOSIS — I255 Ischemic cardiomyopathy: Secondary | ICD-10-CM | POA: Diagnosis not present

## 2020-03-18 DIAGNOSIS — I2510 Atherosclerotic heart disease of native coronary artery without angina pectoris: Secondary | ICD-10-CM | POA: Diagnosis not present

## 2020-03-18 MED ORDER — REPATHA 140 MG/ML ~~LOC~~ SOSY: 140.0000 mg | PREFILLED_SYRINGE | SUBCUTANEOUS | 6 refills | Status: DC

## 2020-03-18 NOTE — Progress Notes (Signed)
Patient is here for follow up visit.  Subjective:   Bobby [name], male    DOB: 1971-08-22, 49 y.o.   MRN: [id number]   Chief Complaint  Patient presents with  . Coronary Artery Disease  . Follow-up  . Results    HPI  49 year old Caucasian male with coronary artery disease status post anterior wall MI 06/2017 s/p successful LAD revascularization, prior RCA stent now with CTO, EF 43% on MRI in 10/2017 with no reversible ischemia.  He has not had any significant chest pain, shortness of breath symptoms.  His physical activity has been limited due to back pain. Recent lipid panel showed LDL 74. He has noticed at least two episodes of irregular heart beat, as noted on his BP monitor. HR was around 120. HE endorses regular snoring.    Current Outpatient Medications on File Prior to Visit  Medication Sig Dispense Refill  . aspirin 81 MG chewable tablet TAKE 1 TABLET BY MOUTH EVERY DAY 30 tablet 11  . atorvastatin (LIPITOR) 20 MG tablet TAKE 1 TABLET BY MOUTH EVERY DAY 90 tablet 0  . carvedilol (COREG) 3.125 MG tablet TAKE 1 TABLET (3.125 MG TOTAL) BY MOUTH 2 (TWO) TIMES DAILY. 90 tablet 3  . Cholecalciferol (VITAMIN D) 2000 units CAPS Take 2,000 Units by mouth daily.     . Coenzyme Q10 (COQ10 PO) Take by mouth daily.    Marland Kitchen ENTRESTO 24-26 MG TAKE 1 TABLET BY MOUTH TWICE A DAY 60 tablet 11  . ezetimibe (ZETIA) 10 MG tablet TAKE 1 TABLET BY MOUTH EVERY DAY 30 tablet 11  . FLUoxetine (PROZAC) 10 MG capsule 40 mg daily.     . ivabradine (CORLANOR) 5 MG TABS tablet Take 0.5 tablets (2.5 mg total) by mouth 2 (two) times daily. 90 tablet 3  . levothyroxine (SYNTHROID, LEVOTHROID) 137 MCG tablet Take 137 mcg by mouth daily before breakfast.    . Multiple Vitamin (MULTIVITAMIN WITH MINERALS) TABS tablet Take 1 tablet by mouth daily.    . nitroGLYCERIN (NITROSTAT) 0.4 MG SL tablet PLACE 1 TABLET (0.4 MG TOTAL) UNDER THE TONGUE EVERY 5 (FIVE) MINUTES AS NEEDED FOR CHEST PAIN.    Marland Kitchen pantoprazole  (PROTONIX) 40 MG tablet TAKE 1 TABLET BY MOUTH EVERY DAY    . sildenafil (VIAGRA) 100 MG tablet TAKE 1 (ONE) TABLET AS NEEDED. DO NOT USE AT SAME TIME AS NITRO 30 tablet 3  . Vitamins/Minerals TABS Take by mouth.     No current facility-administered medications on file prior to visit.    Cardiovascular studies:   EKG 02/20/2019: Sinus rhythm 64 bpm. Old anteroseptal infarct.   Stress perfusion MRI 11/07/2017: 1. Moderately dilated left ventricle with normal wall thickness and moderately decreased systolic function (LVEF = 43%). There is hypokinesis of the basal anteroseptal, anterior walls and akinesis of the mid inferoseptal, anteroseptal, anterior, all apical walls including true apex.   A large infarct in the basal and mid inferior, inferoseptal, anteroseptal, anterior walls and in all the apical walls with no ischemia.   2. Normal right ventricular size, thickness and systolic function (LVEF = 56%). There are no regional wall motion abnormalities.   3.  Mildly dilated left atrium.   4. Mild mitral and tricuspid regurgitation.   Collectively, these findings are consistent with an ischemic cardiomyopathy with a large infarct in the LAD and RCA territory with no peri-infarct ischemia. There is poor chance of recovery if Revascularized.  Cath 08/21/2017: LM: Normal LAD: Patent ostial LAD stent. 20%  ostial diagonal stenosis Ramus: 50% proximal stenosis RCA: Not engaged today. Known RCA CTO   Elevated LVEDP LVEF 45%   Continue aggressive medical therapy  Coronary angiogram 07/13/2017: Elevated LVEDP at 24 mmHg, systemic pressures 80/60 mmHg, patient in cardiogenic shock. Left main normal, LAD flush occluded. Ramus intermediate mid 70-80% % stenosis, mild disease in the circumflex. RCA occluded at the prior angioplasty site. Contralateral collaterals from the LAD.    Large LAD and diagonal, successful angioplasty and stenting following thrombectomy with implantation of  3.5 x 26 mm resolute Onyx DES deployed at 14 atmospheric pressure. Diagonal 1 ostial stenosis balloon angioplasty performed with 2.5 mm balloon at 10 atmospheric pressure. Stenosis reduced to 0%, mid flow improved from 0-3.  Carotid artery duplex 10/05/2017: No hemodynamically significant arterial disease in the internal carotid artery bilaterally. Normal study.  Antegrade right vertebral artery flow. Antegrade left vertebral artery flow.  Recent labs: 03/16/2020: Glucose 121, BUN/Cr 15/1.0. EGFR 86. Na/K 141/4.2. Rest of the CMP normal H/H 15/45. MCV 92. Platelets 206 Chol 165, TG 325, HDL 39, LDL 74  06/2017: HbA1C 6.Marland Kitchen0% TSH 1.0 normal   Review of Systems  Constitution: Positive for malaise/fatigue.  Cardiovascular: Negative for chest pain, dyspnea on exertion, leg swelling, palpitations and syncope.       Objective:    Vitals:   03/18/20 1605  BP: 111/73  Pulse: 68  Resp: 18  Temp: 98.7 F (37.1 C)  SpO2: 97%     Physical Exam  Constitutional: He appears well-developed and well-nourished.  Neck: No JVD present.  Cardiovascular: Normal rate, regular rhythm, normal heart sounds and intact distal pulses.  No murmur heard. Pulmonary/Chest: Effort normal and breath sounds normal. He has no wheezes. He has no rales.  Musculoskeletal:        General: No edema.  Nursing note and vitals reviewed.       Assessment & Recommendations:   49 year old Caucasian male with coronary artery disease status post anterior wall MI 06/2017 s/p successful LAD revascularization, prior RCA stent now with CTO, EF 43% on MRI in 10/2017 with no reversible ischemia.  Ischemic cardiomyopathy Clinically stable.  NYHA class I-2 symptoms.  Continue current guideline directed medical therapy.  Up titration historically limited due to asymptomatic but low blood pressure.  Continue carvedilol 3.125 mg twice daily, Entresto 24-26 mg twice daily, ivabradine 2.5 mg twice daily.    Coronary artery  disease involving native coronary artery of native heart without angina pectoris No anginal symptoms.  Continue aspirin 81 mg daily. LDL remains >70 in spite of maximally tolerated statin therapy. He has tried Crestor and Lipitor, both caused myalgia. Recommend starting Repatha.  Irregular heart beat: Suspected Afib. Recommend ROCT monitoring. Also suspect OSA given his snoring. Will check sleep study.   Repeat lipid panel and f/u in 3 months   [place], MD [facility] Pager: [phone] Office: [phone] If no answer Cell [phone]

## 2020-03-19 ENCOUNTER — Encounter: Payer: Self-pay | Admitting: Cardiology

## 2020-03-29 ENCOUNTER — Other Ambulatory Visit: Payer: Self-pay

## 2020-03-29 MED ORDER — NITROGLYCERIN 0.4 MG SL SUBL: 0.4000 mg | SUBLINGUAL_TABLET | SUBLINGUAL | 2 refills | Status: DC | PRN

## 2020-04-01 ENCOUNTER — Ambulatory Visit: Payer: BC Managed Care – PPO

## 2020-04-01 ENCOUNTER — Other Ambulatory Visit: Payer: Self-pay

## 2020-04-01 ENCOUNTER — Encounter: Payer: Self-pay | Admitting: Neurology

## 2020-04-01 ENCOUNTER — Ambulatory Visit: Payer: BC Managed Care – PPO | Admitting: Neurology

## 2020-04-01 VITALS — BP 108/71 | HR 64 | Ht 73.0 in | Wt 230.0 lb

## 2020-04-01 DIAGNOSIS — R351 Nocturia: Secondary | ICD-10-CM

## 2020-04-01 DIAGNOSIS — I4891 Unspecified atrial fibrillation: Secondary | ICD-10-CM

## 2020-04-01 DIAGNOSIS — E669 Obesity, unspecified: Secondary | ICD-10-CM

## 2020-04-01 DIAGNOSIS — G4719 Other hypersomnia: Secondary | ICD-10-CM

## 2020-04-01 DIAGNOSIS — G4761 Periodic limb movement disorder: Secondary | ICD-10-CM

## 2020-04-01 DIAGNOSIS — Z955 Presence of coronary angioplasty implant and graft: Secondary | ICD-10-CM

## 2020-04-01 DIAGNOSIS — Z820 Family history of epilepsy and other diseases of the nervous system: Secondary | ICD-10-CM

## 2020-04-01 DIAGNOSIS — G4733 Obstructive sleep apnea (adult) (pediatric): Secondary | ICD-10-CM

## 2020-04-01 DIAGNOSIS — I252 Old myocardial infarction: Secondary | ICD-10-CM

## 2020-04-01 NOTE — Progress Notes (Signed)
Subjective:    Patient ID: Bobby [name] is a 49 y.o. male.  HPI     Star Age, MD, PhD [facility] [address] [place], [place] [address]  Dear Dr. Virgina Jock,     I saw your patient, Bobby [name], upon your kind request in my neurologic clinic today for initial consultation of a sleep disturbance, in particular, re-evaluation of obstructive sleep apnea. The patient is unaccompanied today. As you know, Bobby [name] is a 49 year old right-handed gentleman with an underlying medical history of allergic rhinitis, hypertension, hyperlipidemia, thyroid disease, coronary artery disease, status post MI, status post stent placement, suspected A. fib, and mild obesity, who reports significant daytime somnolence and witnessed apneas per wife's report.  He had a heart attack in 2018.  He sometimes wakes up with a sense of gasping for air.  He has nocturia about twice or once per average night, denies any recurrent morning headaches but has a tendency to move his legs or twitch while asleep as noted by himself and his wife.  His father has sleep apnea and uses a CPAP machine.  Patient would be willing to get retested and consider CPAP therapy if the need arises.  He has a bedtime of around 10 and rise time between 5 and 6.  He lives with his wife, he has grown children, 1 dog in the household, dog does not sleep on the bed with them.  They do not watch TV in the bedroom.  He drinks caffeine in the form of half caffeinated coffee, 1 large tumbler per day, otherwise water.  He quit smoking in 2018.  He drinks alcohol occasionally, on the weekends.  I reviewed your office note from 03/18/2020.  His Epworth sleepiness score is 16/24.  I had evaluated him several years ago for suspected sleep apnea, he had mild sleep apnea at the time, he had a sleep study on 11/03/2014 which showed a total AHI of 10.8/h, supine AHI and REM AHI in the moderate range at 17.8 and 18.5,  respectively, O2 nadir of 80%.  He had been advised to return for a CPAP titration study.  He did not return for follow-up and was lost to follow-up since then.  Previously:  09/28/14: 49 year old right-handed gentleman with an underlying medical history of hypothyroidism with Hx of Graves d/s, hyperlipidemia, allergic rhinitis, hypertension, reflux disease, obesity and CAD, s/p MI and 2 stents, who reports non-restorative sleep, and daytime somnolence. He reports difficulty with concentration and he has very restless sleep, tossing and turning, and sleep talking. He grinds his teeth. He has a FHx early cardiac. He has occasional nocturia. He has a bedtime of 9-10 PM, and while he goes to sleep quickly, he wakes multiple times at night. He has had night sweats. He has had hotflashes and feels shaky.  He drinks 2 sodas per day with caffeine. He drinks alcohol rarely. He quit smoking. He denies morning headaches.  He reports excessive daytime somnolence (EDS) and His Epworth Sleepiness Score (ESS) is 14/24 today. He has not fallen asleep while driving. The patient has not been taking a scheduled nap, but sometimes takes a nap, which may make him feel worse. He denies a sense of choking or strangling feeling. There is report of nighttime reflux, with rare nighttime cough experienced. The patient has not noted any RLS symptoms and is known to kick while asleep or before falling asleep. There is family history of RLS or OSA.  He is  a restless sleeper and in the morning, the bed is quite disheveled.    He denies cataplexy, sleep paralysis, hypnagogic or hypnopompic hallucinations, or sleep attacks. He does report dream enactments, or parasomnias, such as sleep talking or sleep walking. The patient has not had a sleep study or a home sleep test. He is not sure if he dreams.  His bedroom is usually dark and cool. There is a TV in the bedroom and usually it is not on at night.  His Past Medical History Is  Significant For: Past Medical History:  Diagnosis Date  . Acute MI anterior wall first episode care [facility]) 07/13/2017   Occluded ostial LAD S/P 3.5x26 Onyx DES. RI 80%. RCA Occluded at prior stent site with collaterals   . Cardiogenic shock ([place]) 07/13/2017  . Graves disease   . Hypertension   . MI (myocardial infarction) ([place])   . Thyroid disease    thyroid irradiated per pt    His Past Surgical History Is Significant For: Past Surgical History:  Procedure Laterality Date  . CORONARY ANGIOPLASTY WITH STENT PLACEMENT    . CORONARY/GRAFT ACUTE MI REVASCULARIZATION N/A 07/13/2017   Procedure: Coronary/Graft Acute MI Revascularization;  Surgeon: Adrian Prows, MD;  Location: [facility];  Service: Cardiovascular;  Laterality: N/A;  . LEFT HEART CATH AND CORONARY ANGIOGRAPHY N/A 07/13/2017   Procedure: Left Heart Cath and Coronary Angiography;  Surgeon: Adrian Prows, MD;  Location: [facility];  Service: Cardiovascular;  Laterality: N/A;  . LEFT HEART CATH AND CORONARY ANGIOGRAPHY N/A 08/21/2017   Procedure: LEFT HEART CATH AND CORONARY ANGIOGRAPHY;  Surgeon: Nigel Mormon, MD;  Location: [facility];  Service: Cardiovascular;  Laterality: N/A;  . SKIN GRAFT    . VENTRICULAR ASSIST DEVICE INSERTION N/A 07/13/2017   Procedure: Ventricular Assist Device Insertion;  Surgeon: Adrian Prows, MD;  Location: [facility];  Service: Cardiovascular;  Laterality: N/A;    His Family History Is Significant For: Family History  Problem Relation Age of Onset  . Heart disease Father   . Hypertension Father   . Heart disease Paternal Grandmother     His Social History Is Significant For: Social History   Socioeconomic History  . Marital status: Single    Spouse name: Not on file  . Number of children: 2  . Years of education: Not on file  . Highest education level: Not on file  Occupational History  . Not on file  Tobacco Use  . Smoking status: Former Smoker     Packs/day: 0.50    Types: Cigarettes    Quit date: 07/13/2017    Years since quitting: 2.7  . Smokeless tobacco: Never Used  . Tobacco comment: quit 2012  Substance and Sexual Activity  . Alcohol use: Yes    Comment: occ  . Drug use: No  . Sexual activity: Yes  Other Topics Concern  . Not on file  Social History Narrative   Lives with fiance, working FT , disesel equip, right handed, 2 kids, college edu   Social Determinants of Health   Financial Resource Strain:   . Difficulty of Paying Living Expenses:   Food Insecurity:   . Worried About Charity fundraiser in the Last Year:   . Arboriculturist in the Last Year:   Transportation Needs:   . Film/video editor (Medical):   [name] Kitchen Lack of Transportation (Non-Medical):   Physical Activity:   . Days of Exercise per  Week:   . Minutes of Exercise per Session:   Stress:   . Feeling of Stress :   Social Connections:   . Frequency of Communication with Friends and Family:   . Frequency of Social Gatherings with Friends and Family:   . Attends Religious Services:   . Active Member of Clubs or Organizations:   . Attends Archivist Meetings:   [name] Kitchen Marital Status:     His Allergies Are:  No Known Allergies:   His Current Medications Are:  Outpatient Encounter Medications as of 04/01/2020  Medication Sig  . aspirin 81 MG chewable tablet TAKE 1 TABLET BY MOUTH EVERY DAY  . atorvastatin (LIPITOR) 20 MG tablet TAKE 1 TABLET BY MOUTH EVERY DAY  . carvedilol (COREG) 3.125 MG tablet TAKE 1 TABLET (3.125 MG TOTAL) BY MOUTH 2 (TWO) TIMES DAILY.  [name] Kitchen Cholecalciferol (VITAMIN D) 2000 units CAPS Take 2,000 Units by mouth daily.   . Coenzyme Q10 (COQ10 PO) Take by mouth daily.  [name] Kitchen ENTRESTO 24-26 MG TAKE 1 TABLET BY MOUTH TWICE A DAY  . Evolocumab (REPATHA) 140 MG/ML SOSY Inject 140 mg into the skin every 14 (fourteen) days.  [name] Kitchen ezetimibe (ZETIA) 10 MG tablet TAKE 1 TABLET BY MOUTH EVERY DAY  . FLUoxetine (PROZAC) 10 MG capsule 30 mg  daily.   . ivabradine (CORLANOR) 5 MG TABS tablet Take 0.5 tablets (2.5 mg total) by mouth 2 (two) times daily.  [name] Kitchen levothyroxine (SYNTHROID, LEVOTHROID) 137 MCG tablet Take 137 mcg by mouth daily before breakfast.  . Multiple Vitamin (MULTIVITAMIN WITH MINERALS) TABS tablet Take 1 tablet by mouth daily.  . nitroGLYCERIN (NITROSTAT) 0.4 MG SL tablet Place 1 tablet (0.4 mg total) under the tongue every 5 (five) minutes as needed for chest pain.  . pantoprazole (PROTONIX) 40 MG tablet TAKE 1 TABLET BY MOUTH EVERY DAY  . sildenafil (VIAGRA) 100 MG tablet TAKE 1 (ONE) TABLET AS NEEDED. DO NOT USE AT SAME TIME AS NITRO  . Vitamins/Minerals TABS Take by mouth.   No facility-administered encounter medications on file as of 04/01/2020.  :   Review of Systems:  Out of a complete 14 point review of systems, all are reviewed and negative with the exception of these symptoms as listed below:  Review of Systems  Neurological:       Pt presents today to discuss his sleep. Pt did not complete his titration study from 2015. Pt reports that he does not really snore but he does stop breathing while he is sleeping.  Epworth Sleepiness Scale 0= would never doze 1= slight chance of dozing 2= moderate chance of dozing 3= high chance of dozing  Sitting and reading: 2 Watching TV: 1 Sitting inactive in a public place (ex. Theater or meeting): 3 As a passenger in a car for an hour without a break: 3 Lying down to rest in the afternoon: 3 Sitting and talking to someone: 1 Sitting quietly after lunch (no alcohol): 3 In a car, while stopped in traffic: 0 Total: 16     Objective:  Neurological Exam  Physical Exam Physical Examination:   Vitals:   04/01/20 0845  BP: 108/71  Pulse: 64    General Examination: The patient is a very pleasant 49 y.o. male in no acute distress. He appears well-developed and well-nourished and well groomed.   HEENT: Normocephalic, atraumatic, pupils are equal, round and  reactive to light, extraocular tracking is good without limitation to gaze excursion or nystagmus noted. Hearing is grossly  intact. Face is symmetric with normal facial animation. Speech is clear with no dysarthria noted. There is no hypophonia. There is no lip, neck/head, jaw or voice tremor. Neck is supple with full range of passive and active motion. There are no carotid bruits on auscultation. Oropharynx exam reveals: moderate mouth dryness, adequate dental hygiene and mild airway crowding. Mallampati is class I. Tongue protrudes centrally and palate elevates symmetrically. Tonsils are absent. Neck size is 17 3/8 inches. He has a Mild overbite.    Chest: Clear to auscultation without wheezing, rhonchi or crackles noted.  Heart: S1+S2+0, regular and normal without murmurs, rubs or gallops noted.   Abdomen: Soft, non-tender and non-distended.  Extremities: There is no edema in the distal lower extremities.  Skin: Warm and dry without trophic changes noted.   Musculoskeletal: exam reveals no obvious joint deformities, tenderness or joint swelling or erythema.   Neurologically:  Mental status: The patient is awake, alert and oriented in all 4 spheres. His immediate and remote memory, attention, language skills and fund of knowledge are appropriate. There is no evidence of aphasia, agnosia, apraxia or anomia. Speech is clear with normal prosody and enunciation. Thought process is linear. Mood is normal and affect is normal.  Cranial nerves II - XII are as described above under HEENT exam.  Motor exam: Normal bulk, strength and tone is noted. There is no tremor, Romberg is negative. Fine motor skills and coordination: grossly intact.  Cerebellar testing: No dysmetria or intention tremor. There is no truncal or gait ataxia.  Sensory exam: intact to light touch in the upper and lower extremities.  Gait, station and balance: He stands easily. No veering to one side is noted. No leaning to one side is  noted. Posture is age-appropriate and stance is narrow based. Gait shows normal stride length and normal pace. No problems turning are noted. Tandem walk is unremarkable.                Assessment and plan:   In summary, Quinnten Biter is a very pleasant 49 y.o.-year old male with an underlying medical history of allergic rhinitis, hypertension, hyperlipidemia, thyroid disease, coronary artery disease, status post MI, status post stent placement, suspected A. fib, and mild obesity, who presents for reevaluation of his obstructive sleep apnea.  He was diagnosed with mild to moderate obstructive sleep apnea in 2015.  He has had an interim heart attack in 2018.  He has had mild weight gain in the interim as well.  He has significant daytime somnolence.  He is advised to proceed with a diagnostic sleep study and consider AutoPap or CPAP therapy.  Depending on the test results we may be able to initiate AutoPap therapy to expedite treatment.  He is supposed to pick up a heart monitor, he is encouraged to go ahead and get his heart monitor going.   I had a long chat with the patient about my findings and the diagnosis of OSA, its prognosis and treatment options. We talked about medical treatments, surgical interventions and non-pharmacological approaches. I explained in particular the risks and ramifications of untreated moderate to severe OSA, especially with respect to developing cardiovascular disease down the Road, including congestive heart failure, difficult to treat hypertension, cardiac arrhythmias, or stroke. Even type 2 diabetes has, in part, been linked to untreated OSA. Symptoms of untreated OSA include daytime sleepiness, memory problems, mood irritability and mood disorder such as depression and anxiety, lack of energy, as well as recurrent headaches, especially morning  headaches. We talked about trying to maintain a healthy lifestyle in general, as well as the importance of weight control. We also talked  about the importance of good sleep hygiene. I recommended the following at this time: sleep study.  I explained the sleep test procedure to the patient and also outlined possible surgical and non-surgical treatment options of OSA, including the use of a custom-made dental device (which would require a referral to a specialist dentist or oral surgeon), upper airway surgical options (which would involve a referral to an ENT surgeon). I also explained the CPAP treatment option to the patient, who indicated that he would be willing to try CPAP if the need arises. I answered all his questions today and the patient was in agreement. I plan to see him back after the sleep study is completed and encouraged him to call with any interim questions, concerns, problems or updates.   Thank you very much for allowing me to participate in the care of this nice patient. If I can be of any further assistance to you please do not hesitate to call me at [phone].  Sincerely,   Star Age, MD, PhD

## 2020-04-01 NOTE — Patient Instructions (Signed)
It was good to see you again today.   Based on your symptoms and your exam I believe you are at risk for obstructive sleep apnea (aka OSA), and I think we should proceed with a sleep study to determine whether you do or do not have OSA and how severe it is. Even, if you have mild OSA, I may want you to consider treatment with CPAP, as treatment of even borderline or mild sleep apnea can result and improvement of symptoms such as sleep disruption, daytime sleepiness, nighttime bathroom breaks, restless leg symptoms, improvement of headache syndromes, even improved mood disorder.   Please remember, the long-term risks and ramifications of untreated moderate to severe obstructive sleep apnea are: increased Cardiovascular disease, including congestive heart failure, stroke, difficult to control hypertension, treatment resistant obesity, arrhythmias, especially irregular heartbeat commonly known as A. Fib. (atrial fibrillation); even type 2 diabetes has been linked to untreated OSA.   Sleep apnea can cause disruption of sleep and sleep deprivation in most cases, which, in turn, can cause recurrent headaches, problems with memory, mood, concentration, focus, and vigilance. Most people with untreated sleep apnea report excessive daytime sleepiness, which can affect their ability to drive. Please do not drive if you feel sleepy. Patients with sleep apnea developed difficulty initiating and maintaining sleep (aka insomnia).   Having sleep apnea may increase your risk for other sleep disorders, including involuntary behaviors sleep such as sleep terrors, sleep talking, sleepwalking.    Having sleep apnea can also increase your risk for restless leg syndrome and leg movements at night.   Please note that untreated obstructive sleep apnea may carry additional perioperative morbidity. Patients with significant obstructive sleep apnea (typically, in the moderate to severe degree) should receive, if possible,  perioperative PAP (positive airway pressure) therapy and the surgeons and particularly the anesthesiologists should be informed of the diagnosis and the severity of the sleep disordered breathing.   I will likely see you back after your sleep study to go over the test results and where to go from there. We will call you after your sleep study to advise about the results (most likely, you will hear from [place], my nurse) and to set up an appointment at the time, as necessary.    Our sleep lab administrative assistant will call you to schedule your sleep study and give you further instructions, regarding the check in process for the sleep study, arrival time, what to bring, when you can expect to leave after the study, etc., and to answer any other logistical questions you may have. If you don't hear back from her by about 2 weeks from now, please feel free to call her direct line at [phone] or you can call our general clinic number, or email [place] through My Chart.

## 2020-04-15 DIAGNOSIS — I4891 Unspecified atrial fibrillation: Secondary | ICD-10-CM | POA: Diagnosis not present

## 2020-04-26 ENCOUNTER — Other Ambulatory Visit: Payer: Self-pay

## 2020-04-26 ENCOUNTER — Ambulatory Visit: Payer: BC Managed Care – PPO | Admitting: Neurology

## 2020-04-26 DIAGNOSIS — R351 Nocturia: Secondary | ICD-10-CM

## 2020-04-26 DIAGNOSIS — G4761 Periodic limb movement disorder: Secondary | ICD-10-CM

## 2020-04-26 DIAGNOSIS — Z820 Family history of epilepsy and other diseases of the nervous system: Secondary | ICD-10-CM

## 2020-04-26 DIAGNOSIS — Z955 Presence of coronary angioplasty implant and graft: Secondary | ICD-10-CM

## 2020-04-26 DIAGNOSIS — I252 Old myocardial infarction: Secondary | ICD-10-CM

## 2020-04-26 DIAGNOSIS — E669 Obesity, unspecified: Secondary | ICD-10-CM

## 2020-04-26 DIAGNOSIS — G4733 Obstructive sleep apnea (adult) (pediatric): Secondary | ICD-10-CM

## 2020-04-26 DIAGNOSIS — G4719 Other hypersomnia: Secondary | ICD-10-CM

## 2020-04-29 ENCOUNTER — Telehealth: Payer: Self-pay

## 2020-04-29 NOTE — Telephone Encounter (Signed)
lvm to schedule new hst

## 2020-05-14 ENCOUNTER — Other Ambulatory Visit: Payer: Self-pay | Admitting: Cardiology

## 2020-05-14 DIAGNOSIS — I2510 Atherosclerotic heart disease of native coronary artery without angina pectoris: Secondary | ICD-10-CM

## 2020-06-07 ENCOUNTER — Ambulatory Visit (INDEPENDENT_AMBULATORY_CARE_PROVIDER_SITE_OTHER): Payer: BC Managed Care – PPO | Admitting: Neurology

## 2020-06-07 DIAGNOSIS — E669 Obesity, unspecified: Secondary | ICD-10-CM

## 2020-06-07 DIAGNOSIS — Z955 Presence of coronary angioplasty implant and graft: Secondary | ICD-10-CM

## 2020-06-07 DIAGNOSIS — G4733 Obstructive sleep apnea (adult) (pediatric): Secondary | ICD-10-CM

## 2020-06-07 DIAGNOSIS — G4719 Other hypersomnia: Secondary | ICD-10-CM

## 2020-06-07 DIAGNOSIS — I252 Old myocardial infarction: Secondary | ICD-10-CM

## 2020-06-07 DIAGNOSIS — Z820 Family history of epilepsy and other diseases of the nervous system: Secondary | ICD-10-CM

## 2020-06-07 DIAGNOSIS — G4761 Periodic limb movement disorder: Secondary | ICD-10-CM

## 2020-06-07 DIAGNOSIS — R351 Nocturia: Secondary | ICD-10-CM

## 2020-06-10 ENCOUNTER — Other Ambulatory Visit: Payer: Self-pay | Admitting: Neurology

## 2020-06-10 DIAGNOSIS — G4733 Obstructive sleep apnea (adult) (pediatric): Secondary | ICD-10-CM

## 2020-06-10 NOTE — Progress Notes (Signed)
Patient referred by Dr. Virgina Jock, seen by me on 04/01/20, HST on 06/07/20.    Please call and notify the patient that the recent home sleep test showed obstructive sleep apnea in the moderate range. While I recommend treatment for this in the form CPAP, his insurance will not approve a sleep study for this. They will likely only approve a trial of autoPAP, which means, that we don't have to bring him in for a sleep study with CPAP, but will let him start using a so called autoPAP machine at home, through a DME company (of his choice, or as per insurance requirement). The DME representative will educate him on how to use the machine, how to put the mask on, etc. I have placed an order in the chart. Please send referral, talk to patient, send report to referring MD. We will need a FU in sleep clinic for 10 weeks post-PAP set up, please arrange that with me or one of our NPs. Thanks,   Star Age, MD, PhD [facility])

## 2020-06-10 NOTE — Procedures (Signed)
Patient Information     First Name: Bobby Last Name: [name] ID: [id number]  Birth Date: [birth year] Age: 49 Gender: Male  Referring Provider: Aletha Halim., PA-C BMI: 30.4 (W=229 lb, H=6' 1'')  Neck Circ.:  43 '' Epworth:  16/24   Sleep Study Information    Study Date: 06/07/20 S/H/A Version: [id number] / 4.1.1528 / 66  History:    49 year old man with a history of allergic rhinitis, hypertension, hyperlipidemia, thyroid disease, coronary artery disease, status post MI, status post stent placement, suspected A. fib, and mild obesity, who reports significant daytime somnolence and witnessed apneas per wife's report.   Summary & Diagnosis:     OSA Recommendations:     This home sleep test demonstrates moderate obstructive sleep apnea with a total AHI of 26.0/hour and O2 nadir of 83%. Treatment with positive airway pressure (in the form of CPAP) is recommended. This will require a full night CPAP titration study for proper treatment settings, O2 monitoring and mask fitting. Based on the severity of the sleep disordered breathing an attended titration study is indicated. However, patient's insurance has denied an attended sleep study; therefore, the patient will be advised to proceed with an autoPAP titration/trial at home for now. Please note that untreated obstructive sleep apnea may carry additional perioperative morbidity. Patients with significant obstructive sleep apnea should receive perioperative PAP therapy and the surgeons and particularly the anesthesiologist should be informed of the diagnosis and the severity of the sleep disordered breathing. The patient should be cautioned not to drive, work at heights, or operate dangerous or heavy equipment when tired or sleepy. Review and reiteration of good sleep hygiene measures should be pursued with any patient. Other causes of the patient's symptoms, including circadian rhythm disturbances, an underlying mood disorder, medication effect and/or  an underlying medical problem cannot be ruled out based on this test. Clinical correlation is recommended. The patient and his referring provider will be notified of the test results. The patient will be seen in follow up in sleep clinic at [facility].  I certify that I have reviewed the raw data recording prior to the issuance of this report in accordance with the standards of the American Academy of Sleep Medicine (AASM).  Star Age, MD, PhD [facility]) Diplomat, ABPN (Neurology and Sleep)             Sleep Summary  Oxygen Saturation Statistics   Start Study Time: End Study Time: Total Recording Time:        10:04:49 PM      6:19:52 AM 8 h, 15 min  Total Sleep Time % REM of Sleep Time:  5 h, 48 min  24.4    Mean: 92 Minimum: 83 Maximum: 96  Mean of Desaturations Nadirs (%):   89  Oxygen Desaturation. %:   4-9 10-20 >20 Total  Events Number Total    80  3 96.4 3.6  0 0.0  83 100.0  Oxygen Saturation: <90 <=88 <85 <80 <70  Duration (minutes): Sleep % 12.0 3.4  4.9 0.2  1.4 0.1 0.0 0.0 0.0 0.0     Respiratory Indices      Total Events REM NREM All Night  pRDI:  160  pAHI:  148 ODI:  83  pAHIc:  58  % CSR: 11.0 13.5 8.5 2.8 3.1 33.0 31.8 18.5 13.2 28.1 26.0 14.6 11.3       Pulse Rate Statistics during Sleep (BPM)      Mean: 61  Minimum: 41 Maximum: 95    Indices are calculated using technically valid sleep time of 5 h, 41 min. Central-Indices are calculated using technically valid sleep time of 5 h, 6 min. pRDI/pAHI are calculated using oxi desaturations ? 3%  Body Position Statistics  Position Supine Prone Right Left Non-Supine  Sleep (min) 70.0 50.5 180.5 47.5 278.5  Sleep % 20.1 14.5 51.8 13.6 79.9  pRDI 58.9 19.1 17.5 31.9 20.3  pAHI 58.9 17.9 15.1 26.8 17.7  ODI 48.5 7.2 3.8 12.8 6.0     Snoring Statistics Snoring Level (dB) >40 >50 >60 >70 >80 >Threshold (45)  Sleep (min) 114.0 11.6 5.0 0.0 0.0 31.4  Sleep %  32.7 3.3 1.4 0.0 0.0 9.0    Mean: 41 dB Sleep Stages Chart                                        pAHI=26.0                                                    Mild              Moderate                    Severe                                                 5              15                    30

## 2020-06-15 ENCOUNTER — Telehealth: Payer: Self-pay

## 2020-06-15 NOTE — Telephone Encounter (Signed)
-----   Message from Star Age, MD sent at 06/10/2020 10:20 AM EDT ----- Patient referred by Dr. Virgina Jock, seen by me on 04/01/20, HST on 06/07/20.    Please call and notify the patient that the recent home sleep test showed obstructive sleep apnea in the moderate range. While I recommend treatment for this in the form CPAP, his insurance will not approve a sleep study for this. They will likely only approve a trial of autoPAP, which means, that we don't have to bring him in for a sleep study with CPAP, but will let him start using a so called autoPAP machine at home, through a DME company (of his choice, or as per insurance requirement). The DME representative will educate him on how to use the machine, how to put the mask on, etc. I have placed an order in the chart. Please send referral, talk to patient, send report to referring MD. We will need a FU in sleep clinic for 10 weeks post-PAP set up, please arrange that with me or one of our NPs. Thanks,   Star Age, MD, PhD [facility])

## 2020-06-15 NOTE — Telephone Encounter (Signed)
Patient calling you back . He states you can leave a detailed message if he misses you again .

## 2020-06-15 NOTE — Telephone Encounter (Signed)
I called pt. No answer, left a message asking pt to call me back.   

## 2020-06-16 NOTE — Telephone Encounter (Signed)
I called pt. I advised pt that Dr. Rexene Alberts reviewed their sleep study results and found that pt had moderate osa. Dr. Rexene Alberts recommends that pt start on auto pap therapy at home. I reviewed PAP compliance expectations with the pt. Pt is agreeable to starting an auto-PAP. I advised pt that an order will be sent to a DME, [facility], and [facility] will call the pt within about one week after they file with the pt's insurance. [facility] will show the pt how to use the machine, fit for masks, and troubleshoot the auto-PAP if needed. A follow up appt was made for insurance purposes with Dr. Rexene Alberts on 08/12/2020 at 1 pm. Pt verbalized understanding to arrive 15 minutes early and bring their auto-PAP. A letter with all of this information in it will be mailed to the pt as a reminder. I verified with the pt that the address we have on file is correct. Pt verbalized understanding of results. Pt had no questions at this time but was encouraged to call back if questions arise. I have sent the order to [facility] and have received confirmation that they have received the order.

## 2020-06-29 ENCOUNTER — Observation Stay
Admission: EM | Admit: 2020-06-29 | Discharge: 2020-06-30 | Disposition: A | Discharge status: Home / Self Care | Payer: BC Managed Care – PPO | Attending: Internal Medicine | Admitting: Internal Medicine

## 2020-06-29 ENCOUNTER — Emergency Department: Payer: BC Managed Care – PPO

## 2020-06-29 ENCOUNTER — Other Ambulatory Visit: Payer: Self-pay

## 2020-06-29 DIAGNOSIS — F39 Unspecified mood [affective] disorder: Secondary | ICD-10-CM | POA: Diagnosis present

## 2020-06-29 DIAGNOSIS — I2510 Atherosclerotic heart disease of native coronary artery without angina pectoris: Secondary | ICD-10-CM | POA: Diagnosis present

## 2020-06-29 DIAGNOSIS — Z87891 Personal history of nicotine dependence: Secondary | ICD-10-CM | POA: Insufficient documentation

## 2020-06-29 DIAGNOSIS — F418 Other specified anxiety disorders: Secondary | ICD-10-CM | POA: Diagnosis not present

## 2020-06-29 DIAGNOSIS — Z955 Presence of coronary angioplasty implant and graft: Secondary | ICD-10-CM | POA: Insufficient documentation

## 2020-06-29 DIAGNOSIS — E890 Postprocedural hypothyroidism: Secondary | ICD-10-CM | POA: Insufficient documentation

## 2020-06-29 DIAGNOSIS — E785 Hyperlipidemia, unspecified: Secondary | ICD-10-CM | POA: Diagnosis not present

## 2020-06-29 DIAGNOSIS — R778 Other specified abnormalities of plasma proteins: Secondary | ICD-10-CM | POA: Diagnosis not present

## 2020-06-29 DIAGNOSIS — I5022 Chronic systolic (congestive) heart failure: Secondary | ICD-10-CM | POA: Insufficient documentation

## 2020-06-29 DIAGNOSIS — K2970 Gastritis, unspecified, without bleeding: Secondary | ICD-10-CM | POA: Diagnosis not present

## 2020-06-29 DIAGNOSIS — Z7982 Long term (current) use of aspirin: Secondary | ICD-10-CM | POA: Insufficient documentation

## 2020-06-29 DIAGNOSIS — K228 Other specified diseases of esophagus: Secondary | ICD-10-CM | POA: Insufficient documentation

## 2020-06-29 DIAGNOSIS — Z20822 Contact with and (suspected) exposure to covid-19: Secondary | ICD-10-CM | POA: Insufficient documentation

## 2020-06-29 DIAGNOSIS — R1010 Upper abdominal pain, unspecified: Secondary | ICD-10-CM | POA: Diagnosis not present

## 2020-06-29 DIAGNOSIS — I255 Ischemic cardiomyopathy: Secondary | ICD-10-CM | POA: Insufficient documentation

## 2020-06-29 DIAGNOSIS — R1013 Epigastric pain: Secondary | ICD-10-CM | POA: Diagnosis not present

## 2020-06-29 DIAGNOSIS — R140 Abdominal distension (gaseous): Secondary | ICD-10-CM | POA: Diagnosis present

## 2020-06-29 DIAGNOSIS — I5023 Acute on chronic systolic (congestive) heart failure: Secondary | ICD-10-CM | POA: Diagnosis present

## 2020-06-29 DIAGNOSIS — I110 Hypertensive heart disease with heart failure: Secondary | ICD-10-CM | POA: Insufficient documentation

## 2020-06-29 DIAGNOSIS — I10 Essential (primary) hypertension: Secondary | ICD-10-CM | POA: Diagnosis present

## 2020-06-29 DIAGNOSIS — R109 Unspecified abdominal pain: Secondary | ICD-10-CM | POA: Diagnosis present

## 2020-06-29 DIAGNOSIS — R112 Nausea with vomiting, unspecified: Secondary | ICD-10-CM | POA: Diagnosis not present

## 2020-06-29 DIAGNOSIS — R7989 Other specified abnormal findings of blood chemistry: Secondary | ICD-10-CM | POA: Diagnosis present

## 2020-06-29 DIAGNOSIS — K219 Gastro-esophageal reflux disease without esophagitis: Secondary | ICD-10-CM | POA: Diagnosis present

## 2020-06-29 DIAGNOSIS — I252 Old myocardial infarction: Secondary | ICD-10-CM | POA: Diagnosis not present

## 2020-06-29 DIAGNOSIS — R197 Diarrhea, unspecified: Secondary | ICD-10-CM | POA: Insufficient documentation

## 2020-06-29 DIAGNOSIS — K3189 Other diseases of stomach and duodenum: Secondary | ICD-10-CM | POA: Diagnosis not present

## 2020-06-29 DIAGNOSIS — R1011 Right upper quadrant pain: Secondary | ICD-10-CM | POA: Diagnosis not present

## 2020-06-29 DIAGNOSIS — E039 Hypothyroidism, unspecified: Secondary | ICD-10-CM | POA: Diagnosis present

## 2020-06-29 DIAGNOSIS — Z79899 Other long term (current) drug therapy: Secondary | ICD-10-CM | POA: Insufficient documentation

## 2020-06-29 HISTORY — DX: Heart failure, unspecified: I50.9

## 2020-06-29 HISTORY — DX: Other specified abnormal findings of blood chemistry: R79.89

## 2020-06-29 HISTORY — DX: Nausea with vomiting, unspecified: R11.2

## 2020-06-29 LAB — URINALYSIS, COMPLETE (UACMP) WITH MICROSCOPIC
Bacteria, UA: NONE SEEN
Bilirubin Urine: NEGATIVE
Glucose, UA: NEGATIVE mg/dL
Hgb urine dipstick: NEGATIVE
Ketones, ur: NEGATIVE mg/dL
Leukocytes,Ua: NEGATIVE
Nitrite: NEGATIVE
Protein, ur: NEGATIVE mg/dL
Specific Gravity, Urine: 1.018 (ref 1.005–1.030)
Squamous Epithelial / HPF: NONE SEEN (ref 0–5)
pH: 5 (ref 5.0–8.0)

## 2020-06-29 LAB — GASTROINTESTINAL PANEL BY PCR, STOOL (REPLACES STOOL CULTURE)

## 2020-06-29 LAB — COMPREHENSIVE METABOLIC PANEL
ALT: 12 U/L (ref 0–44)
AST: 16 U/L (ref 15–41)
Albumin: 3.6 g/dL (ref 3.5–5.0)
Alkaline Phosphatase: 83 U/L (ref 38–126)
Anion gap: 11 (ref 5–15)
BUN: 10 mg/dL (ref 6–20)
CO2: 22 mmol/L (ref 22–32)
Calcium: 8.6 mg/dL — ABNORMAL LOW (ref 8.9–10.3)
Chloride: 108 mmol/L (ref 98–111)
Creatinine, Ser: 1.17 mg/dL (ref 0.61–1.24)
GFR calc Af Amer: >60 mL/min (ref >60)
GFR calc non Af Amer: >60 mL/min (ref >60)
Glucose, Bld: 130 mg/dL — ABNORMAL HIGH (ref 70–99)
Potassium: 3.9 mmol/L (ref 3.5–5.1)
Sodium: 141 mmol/L (ref 135–145)
Total Bilirubin: 0.9 mg/dL (ref 0.3–1.2)
Total Protein: 6.4 g/dL — ABNORMAL LOW (ref 6.5–8.1)

## 2020-06-29 LAB — CBC
HCT: 38.9 % — ABNORMAL LOW (ref 39.0–52.0)
Hemoglobin: 13.9 g/dL (ref 13.0–17.0)
MCH: 32 pg (ref 26.0–34.0)
MCHC: 35.7 g/dL (ref 30.0–36.0)
MCV: 89.6 fL (ref 80.0–100.0)
Platelets: 239 10*3/uL (ref 150–400)
RBC: 4.34 MIL/uL (ref 4.22–5.81)
RDW: 13.9 % (ref 11.5–15.5)
WBC: 8.4 10*3/uL (ref 4.0–10.5)
nRBC: 0 % (ref 0.0–0.2)

## 2020-06-29 LAB — C DIFFICILE QUICK SCREEN W PCR REFLEX
C Diff antigen: NEGATIVE
C Diff interpretation: NOT DETECTED
C Diff toxin: NEGATIVE

## 2020-06-29 LAB — LIPASE, BLOOD: Lipase: 28 U/L (ref 11–51)

## 2020-06-29 LAB — URINE DRUG SCREEN, QUALITATIVE (ARMC ONLY)
Amphetamines, Ur Screen: NOT DETECTED
Barbiturates, Ur Screen: NOT DETECTED
Benzodiazepine, Ur Scrn: POSITIVE — AB
Cannabinoid 50 Ng, Ur ~~LOC~~: POSITIVE — AB
Cocaine Metabolite,Ur ~~LOC~~: NOT DETECTED
MDMA (Ecstasy)Ur Screen: NOT DETECTED
Methadone Scn, Ur: NOT DETECTED
Opiate, Ur Screen: NOT DETECTED
Phencyclidine (PCP) Ur S: NOT DETECTED
Tricyclic, Ur Screen: NOT DETECTED

## 2020-06-29 LAB — TROPONIN I (HIGH SENSITIVITY)
Troponin I (High Sensitivity): 35 ng/L — ABNORMAL HIGH (ref <18)
Troponin I (High Sensitivity): 39 ng/L — ABNORMAL HIGH (ref <18)
Troponin I (High Sensitivity): 28 ng/L — ABNORMAL HIGH (ref <18)
Troponin I (High Sensitivity): 29 ng/L — ABNORMAL HIGH (ref <18)

## 2020-06-29 LAB — BRAIN NATRIURETIC PEPTIDE: B Natriuretic Peptide: 457.8 pg/mL — ABNORMAL HIGH (ref 0.0–100.0)

## 2020-06-29 LAB — SARS CORONAVIRUS 2 BY RT PCR (HOSPITAL ORDER, PERFORMED IN ~~LOC~~ HOSPITAL LAB): SARS Coronavirus 2: NEGATIVE

## 2020-06-29 MED ORDER — MORPHINE SULFATE (PF) 2 MG/ML IV SOLN: 2.0000 mg | INTRAVENOUS | Status: DC | PRN

## 2020-06-29 MED ORDER — EZETIMIBE 10 MG PO TABS
10.0000 mg | ORAL_TABLET | Freq: Every day | ORAL | Status: DC
  Administered 2020-06-29: 10 mg via ORAL
  Filled 2020-06-29 (×4): qty 1

## 2020-06-29 MED ORDER — SACUBITRIL-VALSARTAN 24-26 MG PO TABS
1.0000 | ORAL_TABLET | Freq: Two times a day (BID) | ORAL | Status: DC
  Administered 2020-06-29: 1 via ORAL
  Filled 2020-06-29 (×5): qty 1

## 2020-06-29 MED ORDER — ASPIRIN 81 MG PO CHEW
81.0000 mg | CHEWABLE_TABLET | Freq: Every day | ORAL | Status: DC
  Administered 2020-06-29: 81 mg via ORAL
  Filled 2020-06-29 (×2): qty 1

## 2020-06-29 MED ORDER — NITROGLYCERIN 0.4 MG SL SUBL: 0.4000 mg | SUBLINGUAL_TABLET | SUBLINGUAL | Status: DC | PRN

## 2020-06-29 MED ORDER — ENOXAPARIN SODIUM 40 MG/0.4ML ~~LOC~~ SOLN
40.0000 mg | SUBCUTANEOUS | Status: DC
  Administered 2020-06-29: 40 mg via SUBCUTANEOUS
  Filled 2020-06-29: qty 0.4

## 2020-06-29 MED ORDER — IVABRADINE HCL 5 MG PO TABS
2.5000 mg | ORAL_TABLET | Freq: Two times a day (BID) | ORAL | Status: DC
  Administered 2020-06-29: 2.5 mg via ORAL
  Filled 2020-06-29 (×5): qty 1

## 2020-06-29 MED ORDER — ACETAMINOPHEN 325 MG PO TABS
650.0000 mg | ORAL_TABLET | Freq: Four times a day (QID) | ORAL | Status: DC | PRN
  Administered 2020-06-29: 650 mg via ORAL
  Filled 2020-06-29: qty 2

## 2020-06-29 MED ORDER — SODIUM CHLORIDE 0.9 % IV BOLUS
1000.0000 mL | Freq: Once | INTRAVENOUS | Status: AC
  Administered 2020-06-29: 1000 mL via INTRAVENOUS

## 2020-06-29 MED ORDER — ONDANSETRON HCL 4 MG/2ML IJ SOLN
4.0000 mg | Freq: Three times a day (TID) | INTRAMUSCULAR | Status: DC | PRN
  Administered 2020-06-30: 4 mg via INTRAVENOUS
  Filled 2020-06-29: qty 2

## 2020-06-29 MED ORDER — ADULT MULTIVITAMIN W/MINERALS CH
1.0000 | ORAL_TABLET | Freq: Every day | ORAL | Status: DC
  Administered 2020-06-29 – 2020-06-30 (×2): 1 via ORAL
  Filled 2020-06-29 (×2): qty 1

## 2020-06-29 MED ORDER — LEVOTHYROXINE SODIUM 137 MCG PO TABS
137.0000 ug | ORAL_TABLET | Freq: Every day | ORAL | Status: DC
  Administered 2020-06-30: 06:00:00 137 ug via ORAL
  Filled 2020-06-29 (×3): qty 1

## 2020-06-29 MED ORDER — LIDOCAINE VISCOUS HCL 2 % MT SOLN
15.0000 mL | Freq: Once | OROMUCOSAL | Status: AC
  Administered 2020-06-29: 15 mL via ORAL
  Filled 2020-06-29: qty 15

## 2020-06-29 MED ORDER — MORPHINE SULFATE (PF) 2 MG/ML IV SOLN
1.0000 mg | INTRAVENOUS | Status: DC | PRN
  Administered 2020-06-29: 1 mg via INTRAVENOUS
  Filled 2020-06-29: qty 1

## 2020-06-29 MED ORDER — ATORVASTATIN CALCIUM 20 MG PO TABS
20.0000 mg | ORAL_TABLET | Freq: Every day | ORAL | Status: DC
  Administered 2020-06-29: 20 mg via ORAL
  Filled 2020-06-29: qty 1

## 2020-06-29 MED ORDER — ALUM & MAG HYDROXIDE-SIMETH 200-200-20 MG/5ML PO SUSP
30.0000 mL | Freq: Once | ORAL | Status: AC
  Administered 2020-06-29: 30 mL via ORAL
  Filled 2020-06-29: qty 30

## 2020-06-29 MED ORDER — CARVEDILOL 3.125 MG PO TABS
3.1250 mg | ORAL_TABLET | Freq: Two times a day (BID) | ORAL | Status: DC
  Administered 2020-06-30: 3.125 mg via ORAL
  Filled 2020-06-29: qty 1

## 2020-06-29 MED ORDER — FLUOXETINE HCL 20 MG PO CAPS
40.0000 mg | ORAL_CAPSULE | Freq: Every day | ORAL | Status: DC
  Administered 2020-06-30: 11:00:00 40 mg via ORAL
  Filled 2020-06-29 (×2): qty 2

## 2020-06-29 MED ORDER — PANTOPRAZOLE SODIUM 40 MG PO TBEC
40.0000 mg | DELAYED_RELEASE_TABLET | Freq: Every day | ORAL | Status: DC
  Administered 2020-06-29: 40 mg via ORAL
  Filled 2020-06-29: qty 1

## 2020-06-29 MED ORDER — HYOSCYAMINE SULFATE 0.125 MG SL SUBL
0.2500 mg | SUBLINGUAL_TABLET | Freq: Once | SUBLINGUAL | Status: AC
  Administered 2020-06-29: 0.25 mg via SUBLINGUAL
  Filled 2020-06-29: qty 2

## 2020-06-29 NOTE — ED Triage Notes (Signed)
Patient c/o upper abdominal pain, N/V X 1 week.

## 2020-06-29 NOTE — ED Provider Notes (Signed)
[facility] Emergency Department Provider Note   ____________________________________________   First MD Initiated Contact with Patient 06/29/20 1048     (approximate)  I have reviewed the triage vital signs and the nursing notes.   HISTORY  Chief Complaint Abdominal Pain    HPI Bobby [name] is a 49 y.o. male who comes in with his wife.  Reportedly he felt very bad on Sunday was kind of gray looking and some discomfort in his chest and epigastrium.  Patient later said he thinks that this may have been a heart attack.  He felt something like that when he had his last heart attack and several days later after the last heart attack he felt like he feels now.  He reports having epigastric pain going up into the chest he is nauseated and vomiting and has some diarrhea.  He did not have diarrhea in the emergency room.  Patient was unable to keep down fluids or food.  His blood pressure is low but he reports it often is low.  He cannot keep down his cardiac medication.  He reports the pain feels like someone is reaching up under his ribs and squeezing under his ribs with their hand.  It comes and goes the pain does nothing seems to bring it on or make it better or worse.  He was not short of breath with it.  It did not radiate anywhere else.  It was moderately severe when it occurred.         Past Medical History:  Diagnosis Date  . Acute MI anterior wall first episode care [facility]) 07/13/2017   Occluded ostial LAD S/P 3.5x26 Onyx DES. RI 80%. RCA Occluded at prior stent site with collaterals   . Cardiogenic shock ([place]) 07/13/2017  . CHF (congestive heart failure) ([place])   . Graves disease   . Hypertension   . MI (myocardial infarction) ([place])   . Thyroid disease    thyroid irradiated per pt    Patient Active Problem List   Diagnosis Date Noted  . Nausea vomiting and diarrhea 06/29/2020  . Abdominal pain 06/29/2020  . Elevated troponin 06/29/2020  . HLD  (hyperlipidemia) 06/29/2020  . GERD (gastroesophageal reflux disease) 06/29/2020  . Hypothyroidism 06/29/2020  . CAD (coronary artery disease) 06/29/2020  . Chronic systolic CHF (congestive heart failure) ([place]) 06/29/2020  . Myalgia 05/21/2019  . CHF (congestive heart failure), NYHA class II, chronic, combined ([place]) 02/20/2019  . Ischemic cardiomyopathy 02/20/2019  . Bruit of right carotid artery 02/20/2019  . Laboratory examination 02/20/2019  . Post PTCA 07/13/2017  . Unspecified hypothyroidism 08/27/2013  . Tachycardia 03/15/2012  . Coronary artery disease involving native coronary artery of native heart without angina pectoris 03/15/2012  . HTN (hypertension) 03/15/2012  . Depression with anxiety 03/15/2012    Past Surgical History:  Procedure Laterality Date  . CORONARY ANGIOPLASTY WITH STENT PLACEMENT    . CORONARY/GRAFT ACUTE MI REVASCULARIZATION N/A 07/13/2017   Procedure: Coronary/Graft Acute MI Revascularization;  Surgeon: Adrian Prows, MD;  Location: [place] [facility];  Service: Cardiovascular;  Laterality: N/A;  . LEFT HEART CATH AND CORONARY ANGIOGRAPHY N/A 07/13/2017   Procedure: Left Heart Cath and Coronary Angiography;  Surgeon: Adrian Prows, MD;  Location: [place] [facility];  Service: Cardiovascular;  Laterality: N/A;  . LEFT HEART CATH AND CORONARY ANGIOGRAPHY N/A 08/21/2017   Procedure: LEFT HEART CATH AND CORONARY ANGIOGRAPHY;  Surgeon: Nigel Mormon, MD;  Location: [place] [facility];  Service: Cardiovascular;  Laterality: N/A;  . SKIN GRAFT    . VENTRICULAR ASSIST DEVICE INSERTION N/A 07/13/2017   Procedure: Ventricular Assist Device Insertion;  Surgeon: Adrian Prows, MD;  Location: [place] [facility];  Service: Cardiovascular;  Laterality: N/A;    Prior to Admission medications   Medication Sig Start Date End Date Taking? Authorizing Provider  aspirin 81 MG chewable tablet TAKE 1 TABLET BY MOUTH EVERY DAY Patient taking differently: Chew 81 mg by mouth daily.   03/01/20  Yes Patwardhan, Manish J, MD  atorvastatin (LIPITOR) 20 MG tablet TAKE 1 TABLET BY MOUTH EVERY DAY Patient taking differently: Take 20 mg by mouth at bedtime.  05/14/20  Yes Patwardhan, Manish J, MD  carvedilol (COREG) 3.125 MG tablet TAKE 1 TABLET (3.125 MG TOTAL) BY MOUTH 2 (TWO) TIMES DAILY. 01/22/20  Yes Patwardhan, Manish J, MD  ENTRESTO 24-26 MG TAKE 1 TABLET BY MOUTH TWICE A DAY 03/04/20  Yes Patwardhan, Manish J, MD  Evolocumab (REPATHA) 140 MG/ML SOSY Inject 140 mg into the skin every 14 (fourteen) days. 03/18/20  Yes Patwardhan, Manish J, MD  ezetimibe (ZETIA) 10 MG tablet TAKE 1 TABLET BY MOUTH EVERY DAY Patient taking differently: Take 10 mg by mouth daily.  02/25/20  Yes Patwardhan, Manish J, MD  FLUoxetine (PROZAC) 40 MG capsule Take 40 mg by mouth daily.  05/02/18  Yes [provider]  ivabradine (CORLANOR) 5 MG TABS tablet Take 0.5 tablets (2.5 mg total) by mouth 2 (two) times daily. 02/25/20  Yes Patwardhan, Manish J, MD  levothyroxine (SYNTHROID, LEVOTHROID) 137 MCG tablet Take 137 mcg by mouth daily before breakfast.   Yes [provider]  Multiple Vitamin (MULTIVITAMIN WITH MINERALS) TABS tablet Take 1 tablet by mouth daily.   Yes [provider]  pantoprazole (PROTONIX) 40 MG tablet Take 40 mg by mouth daily.  05/02/18  Yes [provider]  Cholecalciferol (VITAMIN D) 2000 units CAPS Take 2,000 Units by mouth daily.  Patient not taking: Reported on 06/29/2020    [provider]  Coenzyme Q10 (COQ10 PO) Take by mouth daily. Patient not taking: Reported on 06/29/2020    [provider]  nitroGLYCERIN (NITROSTAT) 0.4 MG SL tablet Place 1 tablet (0.4 mg total) under the tongue every 5 (five) minutes as needed for chest pain. 03/29/20   Patwardhan, Reynold Bowen, MD  sildenafil (VIAGRA) 100 MG tablet TAKE 1 (ONE) TABLET AS NEEDED. DO NOT USE AT SAME TIME AS NITRO Patient taking differently: Take 100 mg by mouth as needed. TAKE 1 (ONE) TABLET  AS NEEDED. DO NOT USE AT SAME TIME AS NITRO 03/04/20   Patwardhan, Reynold Bowen, MD  Vitamins/Minerals TABS Take by mouth. Patient not taking: Reported on 06/29/2020    [provider]    Allergies Patient has no known allergies.  Family History  Problem Relation Age of Onset  . Heart disease Father   . Hypertension Father   . Heart disease Paternal Grandmother     Social History Social History   Tobacco Use  . Smoking status: Former Smoker    Packs/day: 0.50    Types: Cigarettes    Quit date: 07/13/2017    Years since quitting: 2.9  . Smokeless tobacco: Never Used  . Tobacco comment: quit 2012  Substance Use Topics  . Alcohol use: Yes    Comment: occ  . Drug use: No    Review of Systems  Constitutional: No fever/chills Eyes: No visual changes. ENT: No sore throat. Cardiovascular: Denies chest pain.  Respiratory: Denies shortness of breath. Gastrointestinal: abdominal pain. nausea, vomiting. diarrhea.  No constipation. Genitourinary: Negative for dysuria. Musculoskeletal: Negative for back pain. Skin: Negative for rash. Neurological: Negative for headaches, focal weakness   ____________________________________________   PHYSICAL EXAM:  VITAL SIGNS: ED Triage Vitals  Enc Vitals Group     BP 06/29/20 0514 (!) 124/94     Pulse Rate 06/29/20 0514 79     Resp 06/29/20 0514 20     Temp 06/29/20 0514 98.6 F (37 C)     Temp Source 06/29/20 0514 Oral     SpO2 06/29/20 0514 95 %     Weight 06/29/20 0511 229 lb 15 oz (104.3 kg)     Height 06/29/20 0511 6\' 1"  (1.854 m)     Head Circumference --      Peak Flow --      Pain Score 06/29/20 0511 4     Pain Loc --      Pain Edu? --      Excl. in [place]? --     Constitutional: Alert and oriented.  Uncomfortable appearing Eyes: Conjunctivae are normal.  Head: Atraumatic. Nose: No congestion/rhinnorhea. Mouth/Throat: Mucous membranes are moist.  Oropharynx non-erythematous. Neck: No stridor.  Cardiovascular:  Normal rate, regular rhythm. Grossly normal heart sounds.  Good peripheral circulation. Respiratory: Normal respiratory effort.  No retractions. Lungs CTAB. Gastrointestinal: Soft and nontender. No distention. No abdominal bruits. No CVA tenderness. Musculoskeletal: No lower extremity tenderness nor edema.  Neurologic:  Normal speech and language. No gross focal neurologic deficits are appreciated.  Skin:  Skin is warm, dry and intact. No rash noted.  ____________________________________________   LABS (all labs ordered are listed, but only abnormal results are displayed)  Labs Reviewed  COMPREHENSIVE METABOLIC PANEL - Abnormal; Notable for the following components:      Result Value   Glucose, Bld 130 (*)    Calcium 8.6 (*)    Total Protein 6.4 (*)    All other components within normal limits  CBC - Abnormal; Notable for the following components:   HCT 38.9 (*)    All other components within normal limits  URINALYSIS, COMPLETE (UACMP) WITH MICROSCOPIC - Abnormal; Notable for the following components:   Color, Urine YELLOW (*)    APPearance HAZY (*)    All other components within normal limits  BRAIN NATRIURETIC PEPTIDE - Abnormal; Notable for the following components:   B Natriuretic Peptide 457.8 (*)    All other components within normal limits  TROPONIN I (HIGH SENSITIVITY) - Abnormal; Notable for the following components:   Troponin I (High Sensitivity) 39 (*)    All other components within normal limits  TROPONIN I (HIGH SENSITIVITY) - Abnormal; Notable for the following components:   Troponin I (High Sensitivity) 35 (*)    All other components within normal limits  TROPONIN I (HIGH SENSITIVITY) - Abnormal; Notable for the following components:   Troponin I (High Sensitivity) 28 (*)    All other components within normal limits  GASTROINTESTINAL PANEL BY PCR, STOOL (REPLACES STOOL CULTURE)  C DIFFICILE QUICK SCREEN W PCR REFLEX  SARS CORONAVIRUS 2 BY RT PCR (HOSPITAL  ORDER, [place] [facility])  LIPASE, BLOOD  TROPONIN I (HIGH SENSITIVITY)   ____________________________________________  EKG  EKG read interpreted by me shows normal sinus rhythm at 71 normal axis nonspecific ST-T changes ____________________________________________  RADIOLOGY  ED MD interpretation: Chest x-ray read by radiology reviewed by me shows some cardiomegaly only.  Ultrasound read  by radiology is negative except for small right-sided pleural effusion only.  Official radiology report(s): DG Chest Portable 1 View  Result Date: 06/29/2020 CLINICAL DATA:  Upper abdominal/epigastric region pain. Hypertension. EXAM: PORTABLE CHEST 1 VIEW COMPARISON:  July 13, 2017 FINDINGS: No edema or airspace opacity. Heart is borderline enlarged with pulmonary vascularity normal. No adenopathy. No bone lesions. No pneumothorax. IMPRESSION: Borderline cardiac enlargement.  No edema or airspace opacity. Electronically Signed   By: Lowella Grip III M.D.   On: 06/29/2020 11:25   US Abdomen Limited RUQ  Result Date: 06/29/2020 CLINICAL DATA:  Right upper quadrant and epigastric pain with nausea and vomiting on and off for weeks EXAM: ULTRASOUND ABDOMEN LIMITED RIGHT UPPER QUADRANT COMPARISON:  None. FINDINGS: Gallbladder: No gallstones or wall thickening visualized. No sonographic Murphy sign noted by sonographer. Common bile duct: Diameter: 3 mm Liver: No focal lesion identified. Within normal limits in parenchymal echogenicity. Portal vein is patent on color Doppler imaging with normal direction of blood flow towards the liver. Other: Right pleural effusion that is small volume by contemporaneous radiography IMPRESSION: 1. Small right pleural effusion. 2. Negative gallbladder. Electronically Signed   By: Monte Fantasia M.D.   On: 06/29/2020 12:00    ____________________________________________   PROCEDURES  Procedure(s) performed (including Critical  Care):  Procedures   ____________________________________________   INITIAL IMPRESSION / ASSESSMENT AND PLAN / ED COURSE  Patient has elevated BNP and somewhat elevated troponin.  Unable to keep down food or fluid.  Says he feels like he felt after his last heart attack.  His oxygen saturation is slightly lower than normal at 92.  His blood pressure is lower than normal although reportedly normal for him.  Still since he cannot keep down fluids or food will get him in the hospital and evaluate him more thoroughly.  We will make sure that this is nothing to do with his heart.              ____________________________________________   FINAL CLINICAL IMPRESSION(S) / ED DIAGNOSES  Final diagnoses:  Non-intractable vomiting with nausea, unspecified vomiting type  Diarrhea, unspecified type  Epigastric pain     ED Discharge Orders    None       Note:  This document was prepared using Dragon voice recognition software and may include unintentional dictation errors.    Nena Polio, MD 06/29/20 [phone]

## 2020-06-29 NOTE — Progress Notes (Signed)
Report called to Neoma Laming on 1C.

## 2020-06-29 NOTE — H&P (Signed)
History and Physical    Bobby [name] FMB:[id number] DOB: 02/26/1971 DOA: 06/29/2020  Referring MD/NP/PA:   PCP: Aletha Halim., PA-C   Patient coming from:  The patient is coming from home.  At baseline, pt is independent for most of ADL.        Chief Complaint: Nausea, vomiting, diarrhea, abdominal pain  HPI: Bobby [name] is a 49 y.o. male with medical history significant of hypertension, hyperlipidemia, GERD, hypothyroidism, depression with anxiety, CAD, myocardial infarction, stent placement, cardiogenic shock, hypothyroidism, sCHF with EF 30-35%, who presents with nausea vomiting, diarrhea and abdominal pain.  Patient states that his symptoms have been going on for about 1 week, including nausea, vomiting, diarrhea and abdominal pain.  Patient has vomited 3-5 times with nonbilious nonbloody vomiting today.  He has at least 3 times of watery diarrhea each day.  His abdominal pain is located in the upper abdomen, constant, 5 out of 10 severity, pressure-like, nonradiating.  It is aggravated by laying on left side per patient.  No fever or chills.  No recent antibiotics use.  Patient has mild cough, mild shortness of breath.  Denies chest pain.  No symptoms of UTI or unilateral weakness.  ED Course: pt was found to have negative C. difficile, pending COVID-19 PCR, WBC 8.4, BMP 457, troponin level 35, 39, 28, negative urinalysis, GFR> 60, temperature normal, blood pressure 98/66, heart rate 74, RR 20, oxygen saturation 95% on room air, chest x-ray showed borderline cardiomegaly without infiltration or pulmonary edema. US-RUQ is negative gallbladder and with small right pleural effusion.  Patient is placed on MedSurg bed for observation  Review of Systems:   General: no fevers, chills, no body weight gain, has poor appetite, has fatigue HEENT: no blurry vision, hearing changes or sore throat Respiratory: no dyspnea, coughing, wheezing CV: no chest pain, no palpitations GI: has nausea,  vomiting, abdominal pain, diarrhea, no constipation GU: no dysuria, burning on urination, increased urinary frequency, hematuria  Ext: trace leg edema Neuro: no unilateral weakness, numbness, or tingling, no vision change or hearing loss Skin: no rash, no skin tear. MSK: No muscle spasm, no deformity, no limitation of range of movement in spin Heme: No easy bruising.  Travel history: No recent long distant travel.  Allergy: No Known Allergies  Past Medical History:  Diagnosis Date  . Acute MI anterior wall first episode care [facility]) 07/13/2017   Occluded ostial LAD S/P 3.5x26 Onyx DES. RI 80%. RCA Occluded at prior stent site with collaterals   . Cardiogenic shock ([place]) 07/13/2017  . CHF (congestive heart failure) ([place])   . Graves disease   . Hypertension   . MI (myocardial infarction) ([place])   . Thyroid disease    thyroid irradiated per pt    Past Surgical History:  Procedure Laterality Date  . CORONARY ANGIOPLASTY WITH STENT PLACEMENT    . CORONARY/GRAFT ACUTE MI REVASCULARIZATION N/A 07/13/2017   Procedure: Coronary/Graft Acute MI Revascularization;  Surgeon: Adrian Prows, MD;  Location: [place];  Service: Cardiovascular;  Laterality: N/A;  . LEFT HEART CATH AND CORONARY ANGIOGRAPHY N/A 07/13/2017   Procedure: Left Heart Cath and Coronary Angiography;  Surgeon: Adrian Prows, MD;  Location: [place] [facility];  Service: Cardiovascular;  Laterality: N/A;  . LEFT HEART CATH AND CORONARY ANGIOGRAPHY N/A 08/21/2017   Procedure: LEFT HEART CATH AND CORONARY ANGIOGRAPHY;  Surgeon: Nigel Mormon, MD;  Location: [facility];  Service: Cardiovascular;  Laterality: N/A;  . SKIN GRAFT    .  VENTRICULAR ASSIST DEVICE INSERTION N/A 07/13/2017   Procedure: Ventricular Assist Device Insertion;  Surgeon: Adrian Prows, MD;  Location: [facility];  Service: Cardiovascular;  Laterality: N/A;    Social History:  reports that he quit smoking about 2 years ago. His smoking use  included cigarettes. He smoked 0.50 packs per day. He has never used smokeless tobacco. He reports current alcohol use. He reports that he does not use drugs.  Family History:  Family History  Problem Relation Age of Onset  . Heart disease Father   . Hypertension Father   . Heart disease Paternal Grandmother      Prior to Admission medications   Medication Sig Start Date End Date Taking? Authorizing Provider  aspirin 81 MG chewable tablet TAKE 1 TABLET BY MOUTH EVERY DAY 03/01/20   Patwardhan, Manish J, MD  atorvastatin (LIPITOR) 20 MG tablet TAKE 1 TABLET BY MOUTH EVERY DAY 05/14/20   Patwardhan, Manish J, MD  carvedilol (COREG) 3.125 MG tablet TAKE 1 TABLET (3.125 MG TOTAL) BY MOUTH 2 (TWO) TIMES DAILY. 01/22/20   Patwardhan, Reynold Bowen, MD  Cholecalciferol (VITAMIN D) 2000 units CAPS Take 2,000 Units by mouth daily.     [provider]  Coenzyme Q10 (COQ10 PO) Take by mouth daily.    [provider]  ENTRESTO 24-26 MG TAKE 1 TABLET BY MOUTH TWICE A DAY 03/04/20   Patwardhan, Manish J, MD  Evolocumab (REPATHA) 140 MG/ML SOSY Inject 140 mg into the skin every 14 (fourteen) days. 03/18/20   Patwardhan, Reynold Bowen, MD  ezetimibe (ZETIA) 10 MG tablet TAKE 1 TABLET BY MOUTH EVERY DAY 02/25/20   Patwardhan, Manish J, MD  FLUoxetine (PROZAC) 10 MG capsule 30 mg daily.  05/02/18   [provider]  ivabradine (CORLANOR) 5 MG TABS tablet Take 0.5 tablets (2.5 mg total) by mouth 2 (two) times daily. 02/25/20   Patwardhan, Reynold Bowen, MD  levothyroxine (SYNTHROID, LEVOTHROID) 137 MCG tablet Take 137 mcg by mouth daily before breakfast.    [provider]  Multiple Vitamin (MULTIVITAMIN WITH MINERALS) TABS tablet Take 1 tablet by mouth daily.    [provider]  nitroGLYCERIN (NITROSTAT) 0.4 MG SL tablet Place 1 tablet (0.4 mg total) under the tongue every 5 (five) minutes as needed for chest pain. 03/29/20   Patwardhan, Manish J, MD  pantoprazole (PROTONIX) 40 MG tablet TAKE 1  TABLET BY MOUTH EVERY DAY 05/02/18   [provider]  sildenafil (VIAGRA) 100 MG tablet TAKE 1 (ONE) TABLET AS NEEDED. DO NOT USE AT SAME TIME AS NITRO 03/04/20   Patwardhan, Reynold Bowen, MD  Vitamins/Minerals TABS Take by mouth.    [provider]    Physical Exam: Vitals:   06/29/20 0511 06/29/20 0514 06/29/20 1105 06/29/20 1300  BP:  (!) 124/94 120/84 98/66  Pulse:  79 70 74  Resp:  20    Temp:  98.6 F (37 C)    TempSrc:  Oral    SpO2:  95% 98% 92%  Weight: 104.3 kg     Height: 6\' 1"  (1.854 m)      General: Not in acute distress HEENT:       Eyes: PERRL, EOMI, no scleral icterus.       ENT: No discharge from the ears and nose, no pharynx injection, no tonsillar enlargement.        Neck: No JVD, no bruit, no mass felt. Heme: No neck lymph node enlargement. Cardiac: S1/S2, RRR, No murmurs, No  gallops or rubs. Respiratory:  No rales, wheezing, rhonchi or rubs. GI: Soft, nondistended, has tenderness in upper abdomen, no rebound pain, no organomegaly, BS present. GU: No hematuria Ext: trace leg edema bilaterally. 1+DP/PT pulse bilaterally. Musculoskeletal: No joint deformities, No joint redness or warmth, no limitation of ROM in spin. Skin: No rashes.  Neuro: Alert, oriented X3, cranial nerves II-XII grossly intact, moves all extremities normally. Psych: Patient is not psychotic, no suicidal or hemocidal ideation.  Labs on Admission: I have personally reviewed following labs and imaging studies  CBC: Recent Labs  Lab 06/29/20 0514  WBC 8.4  HGB 13.9  HCT 38.9*  MCV 89.6  PLT 767   Basic Metabolic Panel: Recent Labs  Lab 06/29/20 0514  NA 141  K 3.9  CL 108  CO2 22  GLUCOSE 130*  BUN 10  CREATININE 1.17  CALCIUM 8.6*   GFR: Estimated Creatinine Clearance: 95.8 mL/min (by C-G formula based on SCr of 1.17 mg/dL). Liver Function Tests: Recent Labs  Lab 06/29/20 0514  AST 16  ALT 12  ALKPHOS 83  BILITOT 0.9  PROT 6.4*  ALBUMIN 3.6   Recent  Labs  Lab 06/29/20 0514  LIPASE 28   No results for input(s): AMMONIA in the last 168 hours. Coagulation Profile: No results for input(s): INR, PROTIME in the last 168 hours. Cardiac Enzymes: No results for input(s): CKTOTAL, CKMB, CKMBINDEX, TROPONINI in the last 168 hours. BNP (last 3 results) No results for input(s): PROBNP in the last 8760 hours. HbA1C: No results for input(s): HGBA1C in the last 72 hours. CBG: No results for input(s): GLUCAP in the last 168 hours. Lipid Profile: No results for input(s): CHOL, HDL, LDLCALC, TRIG, CHOLHDL, LDLDIRECT in the last 72 hours. Thyroid Function Tests: No results for input(s): TSH, T4TOTAL, FREET4, T3FREE, THYROIDAB in the last 72 hours. Anemia Panel: No results for input(s): VITAMINB12, FOLATE, FERRITIN, TIBC, IRON, RETICCTPCT in the last 72 hours. Urine analysis:    Component Value Date/Time   COLORURINE YELLOW (A) 06/29/2020 0519   APPEARANCEUR HAZY (A) 06/29/2020 0519   LABSPEC 1.018 06/29/2020 0519   PHURINE 5.0 06/29/2020 0519   GLUCOSEU NEGATIVE 06/29/2020 0519   HGBUR NEGATIVE 06/29/2020 0519   BILIRUBINUR NEGATIVE 06/29/2020 0519   KETONESUR NEGATIVE 06/29/2020 0519   PROTEINUR NEGATIVE 06/29/2020 0519   NITRITE NEGATIVE 06/29/2020 0519   LEUKOCYTESUR NEGATIVE 06/29/2020 0519   Sepsis Labs: @LABRCNTIP (procalcitonin:4,lacticidven:4) )No results found for this or any previous visit (from the past 240 hour(s)).   Radiological Exams on Admission: DG Chest Portable 1 View  Result Date: 06/29/2020 CLINICAL DATA:  Upper abdominal/epigastric region pain. Hypertension. EXAM: PORTABLE CHEST 1 VIEW COMPARISON:  July 13, 2017 FINDINGS: No edema or airspace opacity. Heart is borderline enlarged with pulmonary vascularity normal. No adenopathy. No bone lesions. No pneumothorax. IMPRESSION: Borderline cardiac enlargement.  No edema or airspace opacity. Electronically Signed   By: Lowella Grip III M.D.   On: 06/29/2020 11:25    US Abdomen Limited RUQ  Result Date: 06/29/2020 CLINICAL DATA:  Right upper quadrant and epigastric pain with nausea and vomiting on and off for weeks EXAM: ULTRASOUND ABDOMEN LIMITED RIGHT UPPER QUADRANT COMPARISON:  None. FINDINGS: Gallbladder: No gallstones or wall thickening visualized. No sonographic Murphy sign noted by sonographer. Common bile duct: Diameter: 3 mm Liver: No focal lesion identified. Within normal limits in parenchymal echogenicity. Portal vein is patent on color Doppler imaging with normal direction of blood flow towards the liver. Other: Right pleural effusion that  is small volume by contemporaneous radiography IMPRESSION: 1. Small right pleural effusion. 2. Negative gallbladder. Electronically Signed   By: Monte Fantasia M.D.   On: 06/29/2020 12:00     EKG: Independently reviewed.  Sinus rhythm, QTC 443, low voltage, LAE, poor R wave progression, nonspecific T wave change  Assessment/Plan Principal Problem:   Nausea vomiting and diarrhea Active Problems:   HTN (hypertension)   Depression with anxiety   Abdominal pain   Elevated troponin   HLD (hyperlipidemia)   GERD (gastroesophageal reflux disease)   Hypothyroidism   CAD (coronary artery disease)   Chronic systolic CHF (congestive heart failure) (HCC)   Nausea vomiting and diarrhea and abdominal pain: Etiology is not clear.  Possibly due to viral gastroenteritis.  C. difficile negative.  -Placed on MedSurg [place] for observation -Follow-up GI pathogen panel -As needed Zofran for nausea and vomiting -As needed morphine for pain -IV fluid: Patient received 1 L normal saline in ED  HTN (hypertension): -Coreg, Entresto  Depression with anxiety -Continue home medications  Elevated troponin and CAD: S/p of stent placement.  Troponin 35, 39, 28.  No chest pain.  Most likely due to demand ischemia -Aspirin, Zetia, Lipitor -Trend troponin -As needed nitroglycerin -Check A1c, FLP, UDS -Repeat EKG in the  morning  HLD (hyperlipidemia) -Zetia, Lipitor  GERD (gastroesophageal reflux disease) -Protonix  Hypothyroidism -Synthroid  Chronic systolic CHF (congestive heart failure) [facility]): Patient has trace leg edema, no pulmonary edema on chest x-ray.  BNP 457, but patient does not seem to have CHF exacerbation -Patient is on aspirin, Coreg        DVT ppx: SQ Lovenox Code Status: Full code Family Communication:  Yes, patient's wife at bed side Disposition Plan:  Anticipate discharge back to previous environment Consults called:  none Admission status: Med-surg bed for obs    Status is: Observation  The patient remains OBS appropriate and will d/c before 2 midnights.  Dispo: The patient is from: Home              Anticipated d/c is to: Home              Anticipated d/c date is: 1 day              Patient currently is not medically stable to d/c.           Date of Service 06/29/2020    Ivor Costa [facility]   If 7PM-7AM, please contact night-coverage [email or web address] 06/29/2020, 3:26 PM

## 2020-06-29 NOTE — Progress Notes (Signed)
Received report from [place], [place] in ED

## 2020-06-30 ENCOUNTER — Inpatient Hospital Stay: Payer: BC Managed Care – PPO | Admitting: Anesthesiology

## 2020-06-30 ENCOUNTER — Encounter
Admission: EM | Disposition: A | Discharge status: Home / Self Care | Payer: Self-pay | Source: Home / Self Care | Attending: Internal Medicine

## 2020-06-30 ENCOUNTER — Other Ambulatory Visit: Payer: Self-pay

## 2020-06-30 ENCOUNTER — Encounter: Payer: Self-pay | Admitting: Internal Medicine

## 2020-06-30 DIAGNOSIS — R197 Diarrhea, unspecified: Secondary | ICD-10-CM | POA: Diagnosis not present

## 2020-06-30 DIAGNOSIS — R112 Nausea with vomiting, unspecified: Secondary | ICD-10-CM | POA: Diagnosis not present

## 2020-06-30 DIAGNOSIS — K2960 Other gastritis without bleeding: Secondary | ICD-10-CM | POA: Diagnosis not present

## 2020-06-30 DIAGNOSIS — R1010 Upper abdominal pain, unspecified: Secondary | ICD-10-CM

## 2020-06-30 DIAGNOSIS — K228 Other specified diseases of esophagus: Secondary | ICD-10-CM | POA: Diagnosis not present

## 2020-06-30 DIAGNOSIS — K2980 Duodenitis without bleeding: Secondary | ICD-10-CM | POA: Diagnosis not present

## 2020-06-30 DIAGNOSIS — K2100 Gastro-esophageal reflux disease with esophagitis, without bleeding: Secondary | ICD-10-CM | POA: Diagnosis not present

## 2020-06-30 DIAGNOSIS — I252 Old myocardial infarction: Secondary | ICD-10-CM | POA: Diagnosis not present

## 2020-06-30 DIAGNOSIS — K3189 Other diseases of stomach and duodenum: Secondary | ICD-10-CM | POA: Diagnosis not present

## 2020-06-30 DIAGNOSIS — I5022 Chronic systolic (congestive) heart failure: Secondary | ICD-10-CM

## 2020-06-30 DIAGNOSIS — R1013 Epigastric pain: Secondary | ICD-10-CM | POA: Diagnosis not present

## 2020-06-30 DIAGNOSIS — K219 Gastro-esophageal reflux disease without esophagitis: Secondary | ICD-10-CM

## 2020-06-30 HISTORY — PX: ESOPHAGOGASTRODUODENOSCOPY: SHX5428

## 2020-06-30 LAB — CBC
HCT: 37 % — ABNORMAL LOW (ref 39.0–52.0)
Hemoglobin: 12.6 g/dL — ABNORMAL LOW (ref 13.0–17.0)
MCH: 31.5 pg (ref 26.0–34.0)
MCHC: 34.1 g/dL (ref 30.0–36.0)
MCV: 92.5 fL (ref 80.0–100.0)
Platelets: 210 10*3/uL (ref 150–400)
RBC: 4 MIL/uL — ABNORMAL LOW (ref 4.22–5.81)
RDW: 13.7 % (ref 11.5–15.5)
WBC: 8.6 10*3/uL (ref 4.0–10.5)
nRBC: 0 % (ref 0.0–0.2)

## 2020-06-30 LAB — LIPID PANEL
Cholesterol: 97 mg/dL (ref 0–200)
HDL: 32 mg/dL — ABNORMAL LOW (ref >40)
LDL Cholesterol: 46 mg/dL (ref 0–99)
Total CHOL/HDL Ratio: 3 RATIO
Triglycerides: 96 mg/dL (ref <150)
VLDL: 19 mg/dL (ref 0–40)

## 2020-06-30 LAB — TROPONIN I (HIGH SENSITIVITY): Troponin I (High Sensitivity): 25 ng/L — ABNORMAL HIGH (ref <18)

## 2020-06-30 LAB — BASIC METABOLIC PANEL
Anion gap: 9 (ref 5–15)
BUN: 9 mg/dL (ref 6–20)
CO2: 23 mmol/L (ref 22–32)
Calcium: 8.5 mg/dL — ABNORMAL LOW (ref 8.9–10.3)
Chloride: 110 mmol/L (ref 98–111)
Creatinine, Ser: 1.11 mg/dL (ref 0.61–1.24)
GFR calc Af Amer: >60 mL/min (ref >60)
GFR calc non Af Amer: >60 mL/min (ref >60)
Glucose, Bld: 107 mg/dL — ABNORMAL HIGH (ref 70–99)
Potassium: 3.7 mmol/L (ref 3.5–5.1)
Sodium: 142 mmol/L (ref 135–145)

## 2020-06-30 LAB — MAGNESIUM: Magnesium: 2.1 mg/dL (ref 1.7–2.4)

## 2020-06-30 LAB — HIV ANTIBODY (ROUTINE TESTING W REFLEX): HIV Screen 4th Generation wRfx: NONREACTIVE

## 2020-06-30 LAB — HEMOGLOBIN A1C
Hgb A1c MFr Bld: 6.1 % — ABNORMAL HIGH (ref 4.8–5.6)
Mean Plasma Glucose: 128.37 mg/dL

## 2020-06-30 SURGERY — EGD (ESOPHAGOGASTRODUODENOSCOPY)
Anesthesia: General

## 2020-06-30 MED ORDER — PANTOPRAZOLE SODIUM 40 MG IV SOLR
40.0000 mg | Freq: Two times a day (BID) | INTRAVENOUS | Status: DC
  Administered 2020-06-30: 40 mg via INTRAVENOUS
  Filled 2020-06-30: qty 40

## 2020-06-30 MED ORDER — PROPOFOL 500 MG/50ML IV EMUL
INTRAVENOUS | Status: AC
  Filled 2020-06-30: qty 50

## 2020-06-30 MED ORDER — ONDANSETRON 8 MG PO TBDP
8.0000 mg | ORAL_TABLET | Freq: Three times a day (TID) | ORAL | 0 refills | Status: DC | PRN
Start: 2020-06-30 — End: 2020-10-07

## 2020-06-30 MED ORDER — LIDOCAINE HCL (CARDIAC) PF 100 MG/5ML IV SOSY
PREFILLED_SYRINGE | INTRAVENOUS | Status: DC | PRN
  Administered 2020-06-30: 25 mg via INTRAVENOUS

## 2020-06-30 MED ORDER — SODIUM CHLORIDE 0.9 % IV SOLN: INTRAVENOUS | Status: DC

## 2020-06-30 MED ORDER — PROPOFOL 10 MG/ML IV BOLUS
INTRAVENOUS | Status: DC | PRN
  Administered 2020-06-30 (×2): 100 mg via INTRAVENOUS
  Administered 2020-06-30: 20 mg via INTRAVENOUS

## 2020-06-30 MED ORDER — PROPOFOL 500 MG/50ML IV EMUL
INTRAVENOUS | Status: DC | PRN
  Administered 2020-06-30: 125 ug/kg/min via INTRAVENOUS

## 2020-06-30 MED ORDER — LIDOCAINE HCL (PF) 2 % IJ SOLN
INTRAMUSCULAR | Status: AC
  Filled 2020-06-30: qty 5

## 2020-06-30 NOTE — Progress Notes (Signed)
PROGRESS NOTE    Bobby [name]   VOJ:[id number]  DOB: 06/18/1971  PCP: Bobby Halim., PA-C    DOA: 06/29/2020 LOS: 0   Brief Narrative   [name] is a 49 y.o. male with medical history significant of hypertension, hyperlipidemia, GERD, hypothyroidism, depression with anxiety, CAD, myocardial infarction, stent placement, cardiogenic shock, hypothyroidism, sCHF with EF 30-35%, who presented to the ED on 06/29/20 with nausea vomiting, diarrhea and abdominal pain.  He reported daily episodes in the morning on sudden onset nausea/vomiting, non-bloody and non-bilious, in addition to at least 3 episodes of water diarrhea daily that seem to consistently occur very shortly after eating or drinking.  Reports associated epigastric pain which was non-radiating by last night had radiation to the back.  Reports chills but no known fevers.  The morning nausea started 1-2 months ago, but was mild and never lasted long.  However, this has become progressively worse, with recent episodes lasting for 6 hours or longer.  Reports about 20 lbs weight loss since this started.  He denies any history of stomach or digestive troubles.    In the ED, soft BP at 98/66, but otherwise vitals stable. C diff and GI pathogen panels both negative.  No leukocytosis.  Mildly elevated troponin down-trended.  RUQ ultrasound was negative.  Admitted to hospitalist service for further evaluation and management.  GI was consulted, and plan for EGD.     Assessment & Plan   Principal Problem:   Nausea vomiting and diarrhea Active Problems:   HTN (hypertension)   Depression with anxiety   Abdominal pain   Elevated troponin   HLD (hyperlipidemia)   GERD (gastroesophageal reflux disease)   Hypothyroidism   CAD (coronary artery disease)   Chronic systolic CHF (congestive heart failure) (HCC)   Nausea vomiting and diarrhea / Epigastric Abdominal Pain - POA with daily recurring symptoms, progressive over 1-2 months, more  severe and constant/daily over past 1-2 weeks.  C diff negative.  GI panel negative.  Lipase was normal.  RUQ ultrasound unremarkable.  Suspect gastritis / PUD / H pylori possibly.  GI is consulted and plan for EGD later today.  Protonix IV BID.  PRN morphine for pain.  PRN Zofran for N/V.  NPO except sips with meds.  Very gently maintenance fluids (EF 30-35%), monitor.  Elevated troponin - POA, trend was flat, no chest pain.  Suspect demand ischemia due to hypotension.    GERD - will use Protonix 40 mg IV BID for now.  Takes Protonix 40 mg PO daily at home.  Hypothyroidism - continue levothyroxine  Coronary Artery Disease - stable, no chest pain.  Continue ASA, Lipitor, Zetia, PRN SL nitro.  Chronic systolic CHF - EF 99-37%.  Continue Coreg.  Not on diuretics.  Euvolemic / compensated.  Hypertension - presented with soft BP, now improved.  Takes Coreg and Herman, continue.  Hyperlipidemia - continue home Lipitor and Zetia  Depression with anxiety - continue home Wellbutrin   Patient BMI: Body mass index is 30.34 kg/m.   DVT prophylaxis: enoxaparin (LOVENOX) injection 40 mg Start: 06/29/20 2200   Diet:  Diet Orders (From admission, onward)    Start     Ordered   06/30/20 1310  Diet NPO time specified  Diet effective now        06/30/20 1310            Code Status: Full Code    Subjective 06/30/20    Patient seen with wife at bedside  this morning.  States he felt okay when he woke up, then became nauseous shortly after.  This is the daily pattern he's been having at home.  Not having epigastric pain today, but last night he said it was radiating to the back which is new.  No fevers, but some chills.  Says he's lost about 20 lbs since this started 1-2 months ago.  Has had N/V episodes last up to 6+ hours.    Disposition Plan & Communication   Status is: Inpatient  Remains inpatient appropriate because:Ongoing diagnostic testing needed not appropriate for outpatient work  up , not tolerating PO intake.  Dispo: The patient is from: Home              Anticipated d/c is to: Home              Anticipated d/c date is: 1 day              Patient currently is not medically stable to d/c.   Family Communication: wife at bedside during encounter    Consults, Procedures, Significant Events   Consultants:   Gastroenterology  Procedures:   EGD pending  Antimicrobials:   None    Objective   Vitals:   06/29/20 2118 06/30/20 0020 06/30/20 0736 06/30/20 1159  BP: 112/79 123/89 118/78 111/76  Pulse:  72 66 71  Resp: 18 16 16 16   Temp: 98.1 F (36.7 C) 98.3 F (36.8 C) 98.2 F (36.8 C) 98.4 F (36.9 C)  TempSrc: Oral Oral Oral Oral  SpO2:  98% 98% 97%  Weight:      Height:        Intake/Output Summary (Last 24 hours) at 06/30/2020 1311 Last data filed at 06/30/2020 0730 Gross per 24 hour  Intake --  Output 400 ml  Net -400 ml   Filed Weights   06/29/20 0511  Weight: 104.3 kg    Physical Exam:  General exam: awake, alert, no acute distress HEENT: atraumatic, clear conjunctiva, anicteric sclera, moist mucus membranes, hearing grossly normal  Respiratory system: CTAB, no wheezes, rales or rhonchi, normal respiratory effort. Cardiovascular system: normal S1/S2, RRR, no JVD, murmurs, rubs, gallops, no pedal edema.   Gastrointestinal system: soft, NT, ND, no HSM felt, hyperactive bowel sounds. Central nervous system: A&O x4. no gross focal neurologic deficits, normal speech Extremities: moves all, no cyanosis, normal tone Skin: dry, intact, normal temperature, normal color Psychiatry: normal mood, congruent affect, judgement and insight appear normal  Labs   Data Reviewed: I have personally reviewed following labs and imaging studies  CBC: Recent Labs  Lab 06/29/20 0514 06/30/20 0000  WBC 8.4 8.6  HGB 13.9 12.6*  HCT 38.9* 37.0*  MCV 89.6 92.5  PLT 239 841   Basic Metabolic Panel: Recent Labs  Lab 06/29/20 0514 06/30/20 0000   NA 141 142  K 3.9 3.7  CL 108 110  CO2 22 23  GLUCOSE 130* 107*  BUN 10 9  CREATININE 1.17 1.11  CALCIUM 8.6* 8.5*   GFR: Estimated Creatinine Clearance: 101 mL/min (by C-G formula based on SCr of 1.11 mg/dL). Liver Function Tests: Recent Labs  Lab 06/29/20 0514  AST 16  ALT 12  ALKPHOS 83  BILITOT 0.9  PROT 6.4*  ALBUMIN 3.6   Recent Labs  Lab 06/29/20 0514  LIPASE 28   No results for input(s): AMMONIA in the last 168 hours. Coagulation Profile: No results for input(s): INR, PROTIME in the last 168 hours. Cardiac Enzymes: No  results for input(s): CKTOTAL, CKMB, CKMBINDEX, TROPONINI in the last 168 hours. BNP (last 3 results) No results for input(s): PROBNP in the last 8760 hours. HbA1C: Recent Labs    06/30/20 0000  HGBA1C 6.1*   CBG: No results for input(s): GLUCAP in the last 168 hours. Lipid Profile: Recent Labs    06/30/20 0000  CHOL 97  HDL 32*  LDLCALC 46  TRIG 96  CHOLHDL 3.0   Thyroid Function Tests: No results for input(s): TSH, T4TOTAL, FREET4, T3FREE, THYROIDAB in the last 72 hours. Anemia Panel: No results for input(s): VITAMINB12, FOLATE, FERRITIN, TIBC, IRON, RETICCTPCT in the last 72 hours. Sepsis Labs: No results for input(s): PROCALCITON, LATICACIDVEN in the last 168 hours.  Recent Results (from the past 240 hour(s))  SARS Coronavirus 2 by RT PCR (hospital order, performed in [facility] hospital lab) Nasopharyngeal Nasopharyngeal Swab     Status: None   Collection Time: 06/29/20  5:09 AM   Specimen: Nasopharyngeal Swab  Result Value Ref Range Status   SARS Coronavirus 2 NEGATIVE NEGATIVE Final    Comment: (NOTE) SARS-CoV-2 target nucleic acids are NOT DETECTED.  The SARS-CoV-2 RNA is generally detectable in upper and lower respiratory specimens during the acute phase of infection. The lowest concentration of SARS-CoV-2 viral copies this assay can detect is 250 copies / mL. A negative result does not preclude SARS-CoV-2  infection and should not be used as the sole basis for treatment or other patient management decisions.  A negative result may occur with improper specimen collection / handling, submission of specimen other than nasopharyngeal swab, presence of viral mutation(s) within the areas targeted by this assay, and inadequate number of viral copies (<250 copies / mL). A negative result must be combined with clinical observations, patient history, and epidemiological information.  Fact Sheet for Patients:   StrictlyIdeas.no  Fact Sheet for Healthcare Providers: BankingDealers.co.za  This test is not yet approved or  cleared by the [place] FDA and has been authorized for detection and/or diagnosis of SARS-CoV-2 by FDA under an Emergency Use Authorization (EUA).  This EUA will remain in effect (meaning this test can be used) for the duration of the COVID-19 declaration under Section 564(b)(1) of the Act, 21 U.S.C. section 360bbb-3(b)(1), unless the authorization is terminated or revoked sooner.  Performed at [facility], [address] [place], [place] [address]   Gastrointestinal Panel by PCR , Stool     Status: None   Collection Time: 06/29/20  3:45 PM   Specimen: Stool  Result Value Ref Range Status   Campylobacter species NOT DETECTED NOT DETECTED Final   Plesimonas shigelloides NOT DETECTED NOT DETECTED Final   Salmonella species NOT DETECTED NOT DETECTED Final   Yersinia enterocolitica NOT DETECTED NOT DETECTED Final   Vibrio species NOT DETECTED NOT DETECTED Final   Vibrio cholerae NOT DETECTED NOT DETECTED Final   Enteroaggregative E coli (EAEC) NOT DETECTED NOT DETECTED Final   Enteropathogenic E coli (EPEC) NOT DETECTED NOT DETECTED Final   Enterotoxigenic E coli (ETEC) NOT DETECTED NOT DETECTED Final   Shiga like toxin producing E coli (STEC) NOT DETECTED NOT DETECTED Final   Shigella/Enteroinvasive E coli (EIEC)  NOT DETECTED NOT DETECTED Final   Cryptosporidium NOT DETECTED NOT DETECTED Final   Cyclospora cayetanensis NOT DETECTED NOT DETECTED Final   Entamoeba histolytica NOT DETECTED NOT DETECTED Final   Giardia lamblia NOT DETECTED NOT DETECTED Final   Adenovirus F40/41 NOT DETECTED NOT DETECTED Final   Astrovirus NOT  DETECTED NOT DETECTED Final   Norovirus GI/GII NOT DETECTED NOT DETECTED Final   Rotavirus A NOT DETECTED NOT DETECTED Final   Sapovirus (I, II, IV, and V) NOT DETECTED NOT DETECTED Final    Comment: Performed at [facility], [place], [place] [address]  C Difficile Quick Screen w PCR reflex     Status: None   Collection Time: 06/29/20  3:45 PM   Specimen: Stool  Result Value Ref Range Status   C Diff antigen NEGATIVE NEGATIVE Final   C Diff toxin NEGATIVE NEGATIVE Final   C Diff interpretation No C. difficile detected.  Final    Comment: Performed at [facility], [address] [place], [place] [address]      Imaging Studies   DG Chest Portable 1 View  Result Date: 06/29/2020 CLINICAL DATA:  Upper abdominal/epigastric region pain. Hypertension. EXAM: PORTABLE CHEST 1 VIEW COMPARISON:  July 13, 2017 FINDINGS: No edema or airspace opacity. Heart is borderline enlarged with pulmonary vascularity normal. No adenopathy. No bone lesions. No pneumothorax. IMPRESSION: Borderline cardiac enlargement.  No edema or airspace opacity. Electronically Signed   By: Lowella Grip III M.D.   On: 06/29/2020 11:25   US Abdomen Limited RUQ  Result Date: 06/29/2020 CLINICAL DATA:  Right upper quadrant and epigastric pain with nausea and vomiting on and off for weeks EXAM: ULTRASOUND ABDOMEN LIMITED RIGHT UPPER QUADRANT COMPARISON:  None. FINDINGS: Gallbladder: No gallstones or wall thickening visualized. No sonographic Murphy sign noted by sonographer. Common bile duct: Diameter: 3 mm Liver: No focal lesion identified. Within normal limits in parenchymal  echogenicity. Portal vein is patent on color Doppler imaging with normal direction of blood flow towards the liver. Other: Right pleural effusion that is small volume by contemporaneous radiography IMPRESSION: 1. Small right pleural effusion. 2. Negative gallbladder. Electronically Signed   By: Monte Fantasia M.D.   On: 06/29/2020 12:00     Medications   Scheduled Meds: . aspirin  81 mg Oral Daily  . atorvastatin  20 mg Oral QHS  . carvedilol  3.125 mg Oral BID  . enoxaparin (LOVENOX) injection  40 mg Subcutaneous Q24H  . ezetimibe  10 mg Oral Daily  . FLUoxetine  40 mg Oral Daily  . ivabradine  2.5 mg Oral BID  . levothyroxine  137 mcg Oral QAC breakfast  . multivitamin with minerals  1 tablet Oral Daily  . pantoprazole (PROTONIX) IV  40 mg Intravenous Q12H  . sacubitril-valsartan  1 tablet Oral BID   Continuous Infusions: . sodium chloride 50 mL/hr at 06/30/20 1054       LOS: 0 days    Time spent: 30 minutes    Ezekiel Slocumb, DO [facility]  06/30/2020, 1:11 PM    If 7PM-7AM, please contact night-coverage. How to contact the [facility] Attending or Consulting provider [place] or covering provider during after hours [place], for this patient?    1. Check the care team in [facility] and look for a) attending/consulting [facility] provider listed and b) the [facility] team listed 2. Log into [email or web address] and use [place]'s universal password to access. If you do not have the password, please contact the hospital operator. 3. Locate the [facility] provider you are looking for under [facility] and page to a number that you can be directly reached. 4. If you still have difficulty reaching the provider, please page the [facility] (Director on Call) for the Hospitalists listed on amion for assistance.

## 2020-06-30 NOTE — Op Note (Signed)
[facility] Patient Name: Bobby [name] Procedure Date: 06/30/2020 2:08 PM MRN: [id number] Account #: [id number] Date of Birth: 1971/09/11 Admit Type: Outpatient Age: 49 Room: [facility] ENDO ROOM 3 Gender: Male Note Status: Finalized Procedure:             Upper GI endoscopy Indications:           Epigastric abdominal pain, Suspected esophageal                         reflux, Nausea with vomiting Providers:             Benay Pike. Zimere Dunlevy MD, MD Medicines:             Propofol per Anesthesia Complications:         No immediate complications. Estimated blood loss: None. Procedure:             Pre-Anesthesia Assessment:                        - The risks and benefits of the procedure and the                         sedation options and risks were discussed with the                         patient. All questions were answered and informed                         consent was obtained.                        - Patient identification and proposed procedure were                         verified prior to the procedure by the nurse. The                         procedure was verified in the procedure room.                        - ASA Grade Assessment: II - A patient with mild                         systemic disease.                        - After reviewing the risks and benefits, the patient                         was deemed in satisfactory condition to undergo the                         procedure.                        After obtaining informed consent, the endoscope was                         passed under direct vision. Throughout the procedure,  the patient's blood pressure, pulse, and oxygen                         saturations were monitored continuously. The Endoscope                         was introduced through the mouth, and advanced to the                         third part of duodenum. The upper GI endoscopy was                          accomplished without difficulty. The patient tolerated                         the procedure well. Findings:      The Z-line was irregular and was found 35 to 37 cm from the incisors.       Mucosa was biopsied with a cold forceps for histology. One specimen       bottle was sent to pathology.      Localized mildly erythematous mucosa without bleeding was found in the       gastric antrum. Biopsies were taken with a cold forceps for Helicobacter       pylori testing.      The cardia and gastric fundus were normal on retroflexion.      Patchy mildly erythematous mucosa without active bleeding and with no       stigmata of bleeding was found in the duodenal bulb.      The second portion of the duodenum and third portion of the duodenum       were normal.      The exam was otherwise without abnormality. Impression:            - Z-line irregular, 35 to 37 cm from the incisors.                         Biopsied.                        - Erythematous mucosa in the antrum. Biopsied.                        - Erythematous duodenopathy.                        - Normal second portion of the duodenum and third                         portion of the duodenum.                        - The examination was otherwise normal. Recommendation:        - Return patient to hospital ward for possible                         discharge same day.                        - Advance diet as tolerated.                        -  Use Protonix (pantoprazole) 40 mg PO daily.                        - Follow up with first available GI provider at                         [place].                        - Await pathology results.                        - The findings and recommendations were discussed with                         the patient. Procedure Code(s):     --- Professional ---                        [phone], Esophagogastroduodenoscopy, flexible,                         transoral; with biopsy, single  or multiple Diagnosis Code(s):     --- Professional ---                        R11.2, Nausea with vomiting, unspecified                        R10.13, Epigastric pain                        K31.89, Other diseases of stomach and duodenum                        K22.8, Other specified diseases of esophagus CPT copyright 2019 American Medical Association. All rights reserved. The codes documented in this report are preliminary and upon coder review may  be revised to meet current compliance requirements. Efrain Sella MD, MD 06/30/2020 2:45:12 PM This report has been signed electronically. Number of Addenda: 0 Note Initiated On: 06/30/2020 2:08 PM Estimated Blood Loss:  Estimated blood loss: none.      [facility]

## 2020-06-30 NOTE — Discharge Summary (Signed)
Physician Discharge Summary  Bobby [name] DGU:[id number] DOB: 09/25/1971 DOA: 06/29/2020  PCP: Aletha Halim., PA-C  Admit date: 06/29/2020 Discharge date: 06/30/2020  Admitted From: home Disposition:  home  Recommendations for Outpatient Follow-up:  1. Follow up with PCP in 1-2 weeks 2. Please obtain BMP/CBC in one week 3. Please follow up with Gastroenterology at [facility]: No  Equipment/Devices: None   Discharge Condition: Stable  CODE STATUS: Full  Diet recommendation: Heart Healthy    Discharge Diagnoses: Principal Problem:   Nausea vomiting and diarrhea Active Problems:   HTN (hypertension)   Depression with anxiety   Abdominal pain   Elevated troponin   HLD (hyperlipidemia)   GERD (gastroesophageal reflux disease)   Hypothyroidism   CAD (coronary artery disease)   Chronic systolic CHF (congestive heart failure) (HCC)    Summary of HPI and Hospital Course:  Bobby [name] is a 49 y.o. male with medical history significant of hypertension, hyperlipidemia, GERD, hypothyroidism, depression with anxiety, CAD, myocardial infarction, stent placement, cardiogenic shock, hypothyroidism, sCHF with EF 30-35%, who presented to the ED on 06/29/20 with nausea vomiting, diarrhea and abdominal pain.  He reported daily episodes in the morning on sudden onset nausea/vomiting, non-bloody and non-bilious, in addition to at least 3 episodes of water diarrhea daily that seem to consistently occur very shortly after eating or drinking.  Reports associated epigastric pain which was non-radiating by last night had radiation to the back.  Reports chills but no known fevers.  The morning nausea started 1-2 months ago, but was mild and never lasted long.  However, this has become progressively worse, with recent episodes lasting for 6 hours or longer.  Reports about 20 lbs weight loss since this started.  He denies any history of stomach or digestive troubles.    In the ED, soft BP at  98/66, but otherwise vitals stable. C diff and GI pathogen panels both negative.  No leukocytosis.  Mildly elevated troponin down-trended.  RUQ ultrasound was negative.  Admitted to hospitalist service for further evaluation and management.  GI was consulted, and plan for EGD.    Nausea vomiting and diarrhea / Epigastric Abdominal Pain - POA with daily recurring symptoms, progressive over 1-2 months, more severe and constant/daily over past 1-2 weeks.  C diff negative.  GI panel negative.  Lipase was normal.  RUQ ultrasound unremarkable.  Suspect gastritis / PUD / H pylori.  GI was consulted. EGD on 7/7 showed   - Z-line irregular, 35 to 37 cm from the incisors. Biopsied. - Erythematous mucosa in the antrum. Biopsied. - Erythematous duodenopathy.  Treated with Protonix IV BID, N morphine for pain, PRN Zofran for N/V, maintenance fluids.  Diet was advanced and tolerated.  Patient clinical improved and stable for discharge home with outpatient GI follow up, first available.  Discharged on PO Protonix 40 mg daily.  Elevated troponin - POA, trend was flat, no chest pain.  Suspect demand ischemia due to hypotension.    GERD - will use Protonix 40 mg IV BID for now.  Takes Protonix 40 mg PO daily at home.  Hypothyroidism - continue levothyroxine  Coronary Artery Disease - stable, no chest pain.  Continue ASA, Lipitor, Zetia, PRN SL nitro.  Chronic systolic CHF - EF 95-63%.  Continue Coreg.  Not on diuretics.  Euvolemic / compensated.  Hypertension - presented with soft BP, now improved.  Takes Coreg and [place], continue.  Hyperlipidemia - continue home Lipitor and Zetia  Depression with anxiety -  continue home Wellbutrin   Discharge Instructions   Discharge Instructions    Call MD for:  extreme fatigue   Complete by: As directed    Call MD for:  persistant dizziness or light-headedness   Complete by: As directed    Call MD for:  persistant nausea and vomiting   Complete by:  As directed    Especially if unable to stay hydrated at home   Call MD for:  severe uncontrolled pain   Complete by: As directed    Call MD for:  temperature >100.4   Complete by: As directed    Diet - low sodium heart healthy   Complete by: As directed    Increase activity slowly   Complete by: As directed      Allergies as of 06/30/2020   No Known Allergies     Medication List    STOP taking these medications   Vitamins/Minerals Tabs     TAKE these medications   aspirin 81 MG chewable tablet TAKE 1 TABLET BY MOUTH EVERY DAY   atorvastatin 20 MG tablet Commonly known as: LIPITOR TAKE 1 TABLET BY MOUTH EVERY DAY What changed: when to take this   carvedilol 3.125 MG tablet Commonly known as: COREG TAKE 1 TABLET (3.125 MG TOTAL) BY MOUTH 2 (TWO) TIMES DAILY.   COQ10 PO Take by mouth daily.   Entresto 24-26 MG Generic drug: sacubitril-valsartan TAKE 1 TABLET BY MOUTH TWICE A DAY   ezetimibe 10 MG tablet Commonly known as: ZETIA TAKE 1 TABLET BY MOUTH EVERY DAY   FLUoxetine 40 MG capsule Commonly known as: PROZAC Take 40 mg by mouth daily.   ivabradine 5 MG Tabs tablet Commonly known as: Corlanor Take 0.5 tablets (2.5 mg total) by mouth 2 (two) times daily.   levothyroxine 137 MCG tablet Commonly known as: SYNTHROID Take 137 mcg by mouth daily before breakfast.   multivitamin with minerals Tabs tablet Take 1 tablet by mouth daily.   nitroGLYCERIN 0.4 MG SL tablet Commonly known as: NITROSTAT Place 1 tablet (0.4 mg total) under the tongue every 5 (five) minutes as needed for chest pain.   ondansetron 8 MG disintegrating tablet Commonly known as: Zofran ODT Take 1 tablet (8 mg total) by mouth every 8 (eight) hours as needed for nausea or vomiting.   pantoprazole 40 MG tablet Commonly known as: PROTONIX Take 40 mg by mouth daily.   Repatha 140 MG/ML Sosy Generic drug: Evolocumab Inject 140 mg into the skin every 14 (fourteen) days.   sildenafil 100  MG tablet Commonly known as: VIAGRA TAKE 1 (ONE) TABLET AS NEEDED. DO NOT USE AT SAME TIME AS NITRO What changed: See the new instructions.   Vitamin D 50 MCG (2000 UT) Caps Take 2,000 Units by mouth daily.       No Known Allergies  Consultations:  Gastroenterology    Procedures/Studies: DG Chest Portable 1 View  Result Date: 06/29/2020 CLINICAL DATA:  Upper abdominal/epigastric region pain. Hypertension. EXAM: PORTABLE CHEST 1 VIEW COMPARISON:  July 13, 2017 FINDINGS: No edema or airspace opacity. Heart is borderline enlarged with pulmonary vascularity normal. No adenopathy. No bone lesions. No pneumothorax. IMPRESSION: Borderline cardiac enlargement.  No edema or airspace opacity. Electronically Signed   By: Lowella Grip III M.D.   On: 06/29/2020 11:25   Home sleep test  Result Date: 06/07/2020 Star Age, MD     06/10/2020 10:18 AM Patient Information    First Name: Bobby Last Name: [name] ID: [id number] Birth  Date: 2071/06/16 Age: 49 Gender: Male Referring Provider: Aletha Halim., PA-C BMI: 30.4 (W=229 lb, H=6' 1'') Neck Circ.:  42 '' Epworth:  16/24  Sleep Study Information   Study Date: 06/07/20 S/H/A Version: [id number] / 4.1.1528 / 45 History:   49 year old man with a history of allergic rhinitis, hypertension, hyperlipidemia, thyroid disease, coronary artery disease, status post MI, status post stent placement, suspected A. fib, and mild obesity, who reports significant daytime somnolence and witnessed apneas per wife's report.  Summary & Diagnosis:    OSA Recommendations:    This home sleep test demonstrates moderate obstructive sleep apnea with a total AHI of 26.0/hour and O2 nadir of 83%. Treatment with positive airway pressure (in the form of CPAP) is recommended. This will require a full night CPAP titration study for proper treatment settings, O2 monitoring and mask fitting. Based on the severity of the sleep disordered breathing an attended titration study is indicated.  However, patient's insurance has denied an attended sleep study; therefore, the patient will be advised to proceed with an autoPAP titration/trial at home for now. Please note that untreated obstructive sleep apnea may carry additional perioperative morbidity. Patients with significant obstructive sleep apnea should receive perioperative PAP therapy and the surgeons and particularly the anesthesiologist should be informed of the diagnosis and the severity of the sleep disordered breathing. The patient should be cautioned not to drive, work at heights, or operate dangerous or heavy equipment when tired or sleepy. Review and reiteration of good sleep hygiene measures should be pursued with any patient. Other causes of the patient's symptoms, including circadian rhythm disturbances, an underlying mood disorder, medication effect and/or an underlying medical problem cannot be ruled out based on this test. Clinical correlation is recommended. The patient and his referring provider will be notified of the test results. The patient will be seen in follow up in sleep clinic at [facility]. I certify that I have reviewed the raw data recording prior to the issuance of this report in accordance with the standards of the American Academy of Sleep Medicine (AASM). Star Age, MD, PhD [facility]) Diplomat, ABPN (Neurology and Sleep) Sleep Summary  Oxygen Saturation Statistics Start Study Time: End Study Time: Total Recording Time:        10:04:49 PM      6:19:52 AM 8 h, 15 min Total Sleep Time % REM of Sleep Time:  5 h, 48 min 24.4  Mean: 92 Minimum: 83 Maximum: 96 Mean of Desaturations Nadirs (%):   89 Oxygen Desaturation. %:   4-9 10-20 >20 Total Events Number Total    80  3 96.4 3.6  0 0.0  83 100.0 Oxygen Saturation: <90 <=88 <85 <80 <70 Duration (minutes): Sleep % 12.0 3.4  4.9 0.2  1.4 0.1 0.0 0.0 0.0 0.0  Respiratory Indices   Total Events REM NREM All Night pRDI:  160 pAHI:  148 ODI:  83 pAHIc:  58 %  CSR: 11.0 13.5 8.5 2.8 3.1 33.0 31.8 18.5 13.2 28.1 26.0 14.6 11.3    Pulse Rate Statistics during Sleep (BPM)   Mean: 61 Minimum: 41 Maximum: 95  Indices are calculated using technically valid sleep time of 5 h, 41 min. Central-Indices are calculated using technically valid sleep time of 5 h, 6 min. pRDI/pAHI are calculated using oxi desaturations ? 3% Body Position Statistics Position Supine Prone Right Left Non-Supine Sleep (min) 70.0 50.5 180.5 47.5 278.5 Sleep % 20.1 14.5 51.8 13.6 79.9 pRDI 58.9 19.1 17.5 31.9  20.3 pAHI 58.9 17.9 15.1 26.8 17.7 ODI 48.5 7.2 3.8 12.8 6.0   Snoring Statistics Snoring Level (dB) >40 >50 >60 >70 >80 >Threshold (45) Sleep (min) 114.0 11.6 5.0 0.0 0.0 31.4 Sleep % 32.7 3.3 1.4 0.0 0.0 9.0  Mean: 41 dB Sleep Stages Chart                                       pAHI=26.0                                                    Mild              Moderate                    Severe                                                 5              15                    30   US Abdomen Limited RUQ  Result Date: 06/29/2020 CLINICAL DATA:  Right upper quadrant and epigastric pain with nausea and vomiting on and off for weeks EXAM: ULTRASOUND ABDOMEN LIMITED RIGHT UPPER QUADRANT COMPARISON:  None. FINDINGS: Gallbladder: No gallstones or wall thickening visualized. No sonographic Murphy sign noted by sonographer. Common bile duct: Diameter: 3 mm Liver: No focal lesion identified. Within normal limits in parenchymal echogenicity. Portal vein is patent on color Doppler imaging with normal direction of blood flow towards the liver. Other: Right pleural effusion that is small volume by contemporaneous radiography IMPRESSION: 1. Small right pleural effusion. 2. Negative gallbladder. Electronically Signed   By: Monte Fantasia M.D.   On: 06/29/2020 12:00      EGD on 06/30/20 - Impression: - Z-line irregular, 35 to 37 cm from the incisors. Biopsied. - Erythematous mucosa in the antrum. Biopsied. -  Erythematous duodenopathy. - Normal second portion of the duodenum and third portion of the duodenum. - The examination was otherwise normal.   Subjective: Patient seen at bedside with wife present this AM prior to EGD.  Continues to have some nausea.  Anxious to return home later if possible.  Discussed trying diet after EGD.  Patient tolerated and requested discharge home.  He denies fever/chills, pain or other complaints.   Discharge Exam: Vitals:   06/30/20 1452 06/30/20 1534  BP: 100/72 122/84  Pulse:  70  Resp:  17  Temp:  98.3 F (36.8 C)  SpO2:  98%   Vitals:   06/30/20 1409 06/30/20 1442 06/30/20 1452 06/30/20 1534  BP: 120/81 112/72 100/72 122/84  Pulse: 77   70  Resp: 18 16  17   Temp: (!) 97.5 F (36.4 C) (!) 96.8 F (36 C)  98.3 F (36.8 C)  TempSrc: Temporal Temporal  Oral  SpO2: 97%   98%  Weight:      Height:        General: Pt is alert, awake, not in acute distress Cardiovascular: RRR, S1/S2 +,  no rubs, no gallops Respiratory: CTA bilaterally, no wheezing, no rhonchi Abdominal: Soft, NT, ND, bowel sounds + Extremities: no edema, no cyanosis    The results of significant diagnostics from this hospitalization (including imaging, microbiology, ancillary and laboratory) are listed below for reference.     Microbiology: Recent Results (from the past 240 hour(s))  SARS Coronavirus 2 by RT PCR (hospital order, performed in [facility] hospital lab) Nasopharyngeal Nasopharyngeal Swab     Status: None   Collection Time: 06/29/20  5:09 AM   Specimen: Nasopharyngeal Swab  Result Value Ref Range Status   SARS Coronavirus 2 NEGATIVE NEGATIVE Final    Comment: (NOTE) SARS-CoV-2 target nucleic acids are NOT DETECTED.  The SARS-CoV-2 RNA is generally detectable in upper and lower respiratory specimens during the acute phase of infection. The lowest concentration of SARS-CoV-2 viral copies this assay can detect is 250 copies / mL. A negative result does not  preclude SARS-CoV-2 infection and should not be used as the sole basis for treatment or other patient management decisions.  A negative result may occur with improper specimen collection / handling, submission of specimen other than nasopharyngeal swab, presence of viral mutation(s) within the areas targeted by this assay, and inadequate number of viral copies (<250 copies / mL). A negative result must be combined with clinical observations, patient history, and epidemiological information.  Fact Sheet for Patients:   StrictlyIdeas.no  Fact Sheet for Healthcare Providers: BankingDealers.co.za  This test is not yet approved or  cleared by the [place] FDA and has been authorized for detection and/or diagnosis of SARS-CoV-2 by FDA under an Emergency Use Authorization (EUA).  This EUA will remain in effect (meaning this test can be used) for the duration of the COVID-19 declaration under Section 564(b)(1) of the Act, 21 U.S.C. section 360bbb-3(b)(1), unless the authorization is terminated or revoked sooner.  Performed at [facility], [address] [place], [place] [address]   Gastrointestinal Panel by PCR , Stool     Status: None   Collection Time: 06/29/20  3:45 PM   Specimen: Stool  Result Value Ref Range Status   Campylobacter species NOT DETECTED NOT DETECTED Final   Plesimonas shigelloides NOT DETECTED NOT DETECTED Final   Salmonella species NOT DETECTED NOT DETECTED Final   Yersinia enterocolitica NOT DETECTED NOT DETECTED Final   Vibrio species NOT DETECTED NOT DETECTED Final   Vibrio cholerae NOT DETECTED NOT DETECTED Final   Enteroaggregative E coli (EAEC) NOT DETECTED NOT DETECTED Final   Enteropathogenic E coli (EPEC) NOT DETECTED NOT DETECTED Final   Enterotoxigenic E coli (ETEC) NOT DETECTED NOT DETECTED Final   Shiga like toxin producing E coli (STEC) NOT DETECTED NOT DETECTED Final    Shigella/Enteroinvasive E coli (EIEC) NOT DETECTED NOT DETECTED Final   Cryptosporidium NOT DETECTED NOT DETECTED Final   Cyclospora cayetanensis NOT DETECTED NOT DETECTED Final   Entamoeba histolytica NOT DETECTED NOT DETECTED Final   Giardia lamblia NOT DETECTED NOT DETECTED Final   Adenovirus F40/41 NOT DETECTED NOT DETECTED Final   Astrovirus NOT DETECTED NOT DETECTED Final   Norovirus GI/GII NOT DETECTED NOT DETECTED Final   Rotavirus A NOT DETECTED NOT DETECTED Final   Sapovirus (I, II, IV, and V) NOT DETECTED NOT DETECTED Final    Comment: Performed at [facility], [address] [place], [place] [address]  C Difficile Quick Screen w PCR reflex     Status: None   Collection Time: 06/29/20  3:45 PM  Specimen: Stool  Result Value Ref Range Status   C Diff antigen NEGATIVE NEGATIVE Final   C Diff toxin NEGATIVE NEGATIVE Final   C Diff interpretation No C. difficile detected.  Final    Comment: Performed at [facility], [address] [place], [place] [address]     Labs: BNP (last 3 results) Recent Labs    06/29/20 0519  BNP 269.4*   Basic Metabolic Panel: Recent Labs  Lab 06/29/20 0514 06/30/20 0000  NA 141 142  K 3.9 3.7  CL 108 110  CO2 22 23  GLUCOSE 130* 107*  BUN 10 9  CREATININE 1.17 1.11  CALCIUM 8.6* 8.5*  MG  --  2.1   Liver Function Tests: Recent Labs  Lab 06/29/20 0514  AST 16  ALT 12  ALKPHOS 83  BILITOT 0.9  PROT 6.4*  ALBUMIN 3.6   Recent Labs  Lab 06/29/20 0514  LIPASE 28   No results for input(s): AMMONIA in the last 168 hours. CBC: Recent Labs  Lab 06/29/20 0514 06/30/20 0000  WBC 8.4 8.6  HGB 13.9 12.6*  HCT 38.9* 37.0*  MCV 89.6 92.5  PLT 239 210   Cardiac Enzymes: No results for input(s): CKTOTAL, CKMB, CKMBINDEX, TROPONINI in the last 168 hours. BNP: Invalid input(s): POCBNP CBG: No results for input(s): GLUCAP in the last 168 hours. D-Dimer No results for input(s): DDIMER in the last 72  hours. Hgb A1c Recent Labs    06/30/20 0000  HGBA1C 6.1*   Lipid Profile Recent Labs    06/30/20 0000  CHOL 97  HDL 32*  LDLCALC 46  TRIG 96  CHOLHDL 3.0   Thyroid function studies No results for input(s): TSH, T4TOTAL, T3FREE, THYROIDAB in the last 72 hours.  Invalid input(s): FREET3 Anemia work up No results for input(s): VITAMINB12, FOLATE, FERRITIN, TIBC, IRON, RETICCTPCT in the last 72 hours. Urinalysis    Component Value Date/Time   COLORURINE YELLOW (A) 06/29/2020 0519   APPEARANCEUR HAZY (A) 06/29/2020 0519   LABSPEC 1.018 06/29/2020 0519   PHURINE 5.0 06/29/2020 0519   GLUCOSEU NEGATIVE 06/29/2020 0519   HGBUR NEGATIVE 06/29/2020 0519   BILIRUBINUR NEGATIVE 06/29/2020 0519   KETONESUR NEGATIVE 06/29/2020 0519   PROTEINUR NEGATIVE 06/29/2020 0519   NITRITE NEGATIVE 06/29/2020 0519   LEUKOCYTESUR NEGATIVE 06/29/2020 0519   Sepsis Labs Invalid input(s): PROCALCITONIN,  WBC,  LACTICIDVEN Microbiology Recent Results (from the past 240 hour(s))  SARS Coronavirus 2 by RT PCR (hospital order, performed in [place] hospital lab) Nasopharyngeal Nasopharyngeal Swab     Status: None   Collection Time: 06/29/20  5:09 AM   Specimen: Nasopharyngeal Swab  Result Value Ref Range Status   SARS Coronavirus 2 NEGATIVE NEGATIVE Final    Comment: (NOTE) SARS-CoV-2 target nucleic acids are NOT DETECTED.  The SARS-CoV-2 RNA is generally detectable in upper and lower respiratory specimens during the acute phase of infection. The lowest concentration of SARS-CoV-2 viral copies this assay can detect is 250 copies / mL. A negative result does not preclude SARS-CoV-2 infection and should not be used as the sole basis for treatment or other patient management decisions.  A negative result may occur with improper specimen collection / handling, submission of specimen other than nasopharyngeal swab, presence of viral mutation(s) within the areas targeted by this assay, and  inadequate number of viral copies (<250 copies / mL). A negative result must be combined with clinical observations, patient history, and epidemiological information.  Fact Sheet for Patients:  StrictlyIdeas.no  Fact Sheet for Healthcare Providers: BankingDealers.co.za  This test is not yet approved or  cleared by the [place] FDA and has been authorized for detection and/or diagnosis of SARS-CoV-2 by FDA under an Emergency Use Authorization (EUA).  This EUA will remain in effect (meaning this test can be used) for the duration of the COVID-19 declaration under Section 564(b)(1) of the Act, 21 U.S.C. section 360bbb-3(b)(1), unless the authorization is terminated or revoked sooner.  Performed at [facility], [address] [place], [place] [address]   Gastrointestinal Panel by PCR , Stool     Status: None   Collection Time: 06/29/20  3:45 PM   Specimen: Stool  Result Value Ref Range Status   Campylobacter species NOT DETECTED NOT DETECTED Final   Plesimonas shigelloides NOT DETECTED NOT DETECTED Final   Salmonella species NOT DETECTED NOT DETECTED Final   Yersinia enterocolitica NOT DETECTED NOT DETECTED Final   Vibrio species NOT DETECTED NOT DETECTED Final   Vibrio cholerae NOT DETECTED NOT DETECTED Final   Enteroaggregative E coli (EAEC) NOT DETECTED NOT DETECTED Final   Enteropathogenic E coli (EPEC) NOT DETECTED NOT DETECTED Final   Enterotoxigenic E coli (ETEC) NOT DETECTED NOT DETECTED Final   Shiga like toxin producing E coli (STEC) NOT DETECTED NOT DETECTED Final   Shigella/Enteroinvasive E coli (EIEC) NOT DETECTED NOT DETECTED Final   Cryptosporidium NOT DETECTED NOT DETECTED Final   Cyclospora cayetanensis NOT DETECTED NOT DETECTED Final   Entamoeba histolytica NOT DETECTED NOT DETECTED Final   Giardia lamblia NOT DETECTED NOT DETECTED Final   Adenovirus F40/41 NOT DETECTED NOT DETECTED Final    Astrovirus NOT DETECTED NOT DETECTED Final   Norovirus GI/GII NOT DETECTED NOT DETECTED Final   Rotavirus A NOT DETECTED NOT DETECTED Final   Sapovirus (I, II, IV, and V) NOT DETECTED NOT DETECTED Final    Comment: Performed at [facility], [address] [place], [place] [address]  C Difficile Quick Screen w PCR reflex     Status: None   Collection Time: 06/29/20  3:45 PM   Specimen: Stool  Result Value Ref Range Status   C Diff antigen NEGATIVE NEGATIVE Final   C Diff toxin NEGATIVE NEGATIVE Final   C Diff interpretation No C. difficile detected.  Final    Comment: Performed at [facility], [address] [place], [place] [address]     Time coordinating discharge: Over 30 minutes  SIGNED:   Ezekiel Slocumb, DO [facility] 06/30/2020, 6:37 PM   If 7PM-7AM, please contact night-coverage [email or web address]

## 2020-06-30 NOTE — Consult Note (Signed)
GI Inpatient Consult Note  Reason for Consult: Epigastric abdominal pain, nausea/vomiting    Attending Requesting Consult: Dr. Nicole Kindred, DO  History of Present Illness: Bobby [name] is a 49 y.o. male seen for evaluation of epigastric abdominal pain and non-intractable nausea and vomiting at the request of Dr. Arbutus Ped. Pt has a PMH of HTN, HLD, GERD, hypothyroidism, depression, CAD, Hx of MI s/p stent placement 41/6606, systolic CHF with EF 30-16%. Patient presented to the ED yesterday morning for a 10-day history of progressive epigastric abdominal pain with associated symptoms of non-intractable nausea and vomiting, abdominal bloating, and diarrhea. He reports over the past 10 days he has been experiencing severe nausea, vomiting, and epigastric abdominal pain usually starting in morning about one hour after taking his thyroid medication. He can have sudden onset nausea and NBNB emesis about 3-4 times in the morning with appx 3 episodes of watery diarrhea that comes on within thirty minutes after breakfast. He describes epigastric abdominal pain like an aching sensation located in his epigastric region which is non-radiating, but did radiate to his back last night. He has never had pain like this before. Pain can come on and last for 5-6 hours at a time. Pain seems to be constant over the past few days. He denies any dysphagia, odynophagia, retrosternal chest pain, hematochezia, or melena. No sick contacts or new changes to medications or supplements. No frequent NSAID use or stressors. He reports prior to symptom onset he did feel like he had a 2-day period of worsening reflux and pyrosis, but this resolved and hasn't occurred since. He takes Protonix 40 mg daily at home for GERD and has been on this regimen for many years. He denies fevers, but has noticed severe chills. He reports his appetite has been severely reduced and reports subjective weight loss of 15-20 lbs. No prior history of stomach  ulcers or gastritis. In the ED, he had soft BP of 98/66 with otherwise vital signs within normal limits. He had GI PCR and c diff return negative for infectious agents. Labs showed no evidence of leukocytosis, anemia, mildly elevated troponins which were down-trended. RUQ US showed no evidence of cholelithiasis, gallbladder wall thickening, or pericholecystic fluid. GI was consulted due to these symptoms for consideration of EGD. No prior endoscopy.    Past Medical History:  Past Medical History:  Diagnosis Date  . Acute MI anterior wall first episode care [facility]) 07/13/2017   Occluded ostial LAD S/P 3.5x26 Onyx DES. RI 80%. RCA Occluded at prior stent site with collaterals   . Cardiogenic shock ([place]) 07/13/2017  . CHF (congestive heart failure) ([place])   . Graves disease   . Hypertension   . MI (myocardial infarction) ([place])   . Thyroid disease    thyroid irradiated per pt    Problem List: Patient Active Problem List   Diagnosis Date Noted  . Nausea vomiting and diarrhea 06/29/2020  . Abdominal pain 06/29/2020  . Elevated troponin 06/29/2020  . HLD (hyperlipidemia) 06/29/2020  . GERD (gastroesophageal reflux disease) 06/29/2020  . Hypothyroidism 06/29/2020  . CAD (coronary artery disease) 06/29/2020  . Chronic systolic CHF (congestive heart failure) ([place]) 06/29/2020  . Myalgia 05/21/2019  . CHF (congestive heart failure), NYHA class II, chronic, combined ([place]) 02/20/2019  . Ischemic cardiomyopathy 02/20/2019  . Bruit of right carotid artery 02/20/2019  . Laboratory examination 02/20/2019  . Post PTCA 07/13/2017  . Unspecified hypothyroidism 08/27/2013  . Tachycardia 03/15/2012  . Coronary artery disease involving native coronary artery of  native heart without angina pectoris 03/15/2012  . HTN (hypertension) 03/15/2012  . Depression with anxiety 03/15/2012    Past Surgical History: Past Surgical History:  Procedure Laterality Date  . CORONARY ANGIOPLASTY WITH STENT PLACEMENT    .  CORONARY/GRAFT ACUTE MI REVASCULARIZATION N/A 07/13/2017   Procedure: Coronary/Graft Acute MI Revascularization;  Surgeon: Adrian Prows, MD;  Location: [place] [facility];  Service: Cardiovascular;  Laterality: N/A;  . LEFT HEART CATH AND CORONARY ANGIOGRAPHY N/A 07/13/2017   Procedure: Left Heart Cath and Coronary Angiography;  Surgeon: Adrian Prows, MD;  Location: [place] [facility];  Service: Cardiovascular;  Laterality: N/A;  . LEFT HEART CATH AND CORONARY ANGIOGRAPHY N/A 08/21/2017   Procedure: LEFT HEART CATH AND CORONARY ANGIOGRAPHY;  Surgeon: Nigel Mormon, MD;  Location: [place] [facility];  Service: Cardiovascular;  Laterality: N/A;  . SKIN GRAFT    . VENTRICULAR ASSIST DEVICE INSERTION N/A 07/13/2017   Procedure: Ventricular Assist Device Insertion;  Surgeon: Adrian Prows, MD;  Location: [place] [facility];  Service: Cardiovascular;  Laterality: N/A;    Allergies: No Known Allergies  Home Medications: Medications Prior to Admission  Medication Sig Dispense Refill Last Dose  . aspirin 81 MG chewable tablet TAKE 1 TABLET BY MOUTH EVERY DAY (Patient taking differently: Chew 81 mg by mouth daily. ) 30 tablet 11 06/28/2020 at 2200  . atorvastatin (LIPITOR) 20 MG tablet TAKE 1 TABLET BY MOUTH EVERY DAY (Patient taking differently: Take 20 mg by mouth at bedtime. ) 90 tablet 0 06/28/2020 at 2200  . carvedilol (COREG) 3.125 MG tablet TAKE 1 TABLET (3.125 MG TOTAL) BY MOUTH 2 (TWO) TIMES DAILY. 90 tablet 3 06/29/2020 at 1200  . ENTRESTO 24-26 MG TAKE 1 TABLET BY MOUTH TWICE A DAY 60 tablet 11 06/29/2020 at 1200  . Evolocumab (REPATHA) 140 MG/ML SOSY Inject 140 mg into the skin every 14 (fourteen) days. 2.1 mL 6 Past Month at Unknown time  . ezetimibe (ZETIA) 10 MG tablet TAKE 1 TABLET BY MOUTH EVERY DAY (Patient taking differently: Take 10 mg by mouth daily. ) 30 tablet 11 06/28/2020 at 2200  . FLUoxetine (PROZAC) 40 MG capsule Take 40 mg by mouth daily.    06/29/2020 at 1200  . ivabradine (CORLANOR) 5 MG  TABS tablet Take 0.5 tablets (2.5 mg total) by mouth 2 (two) times daily. 90 tablet 3 06/29/2020 at 1200  . levothyroxine (SYNTHROID, LEVOTHROID) 137 MCG tablet Take 137 mcg by mouth daily before breakfast.   06/28/2020 at 0700  . Multiple Vitamin (MULTIVITAMIN WITH MINERALS) TABS tablet Take 1 tablet by mouth daily.   06/28/2020 at 0800  . pantoprazole (PROTONIX) 40 MG tablet Take 40 mg by mouth daily.    06/28/2020 at 2200  . Cholecalciferol (VITAMIN D) 2000 units CAPS Take 2,000 Units by mouth daily.  (Patient not taking: Reported on 06/29/2020)   Not Taking at Unknown time  . Coenzyme Q10 (COQ10 PO) Take by mouth daily. (Patient not taking: Reported on 06/29/2020)   Not Taking at Unknown time  . nitroGLYCERIN (NITROSTAT) 0.4 MG SL tablet Place 1 tablet (0.4 mg total) under the tongue every 5 (five) minutes as needed for chest pain. 30 tablet 2 prn at prn  . sildenafil (VIAGRA) 100 MG tablet TAKE 1 (ONE) TABLET AS NEEDED. DO NOT USE AT SAME TIME AS NITRO (Patient taking differently: Take 100 mg by mouth as needed. TAKE 1 (ONE) TABLET AS NEEDED. DO NOT USE AT SAME TIME AS NITRO) 30 tablet  3 prn at prn  . Vitamins/Minerals TABS Take by mouth. (Patient not taking: Reported on 06/29/2020)   Not Taking   Home medication reconciliation was completed with the patient.   Scheduled Inpatient Medications:   . aspirin  81 mg Oral Daily  . atorvastatin  20 mg Oral QHS  . carvedilol  3.125 mg Oral BID  . enoxaparin (LOVENOX) injection  40 mg Subcutaneous Q24H  . ezetimibe  10 mg Oral Daily  . FLUoxetine  40 mg Oral Daily  . ivabradine  2.5 mg Oral BID  . levothyroxine  137 mcg Oral QAC breakfast  . multivitamin with minerals  1 tablet Oral Daily  . pantoprazole (PROTONIX) IV  40 mg Intravenous Q12H  . sacubitril-valsartan  1 tablet Oral BID    Continuous Inpatient Infusions:   . sodium chloride 50 mL/hr at 06/30/20 1054    PRN Inpatient Medications:  acetaminophen, morphine injection, nitroGLYCERIN,  ondansetron (ZOFRAN) IV  Family History: family history includes Heart disease in his father and paternal grandmother; Hypertension in his father.  The patient's family history is negative for inflammatory bowel disorders, GI malignancy, or solid organ transplantation.  Social History:   reports that he quit smoking about 2 years ago. His smoking use included cigarettes. He smoked 0.50 packs per day. He has never used smokeless tobacco. He reports current alcohol use. He reports that he does not use drugs. The patient denies ETOH, tobacco, or drug use.   Review of Systems: Constitutional: Weight is stable.  Eyes: No changes in vision. ENT: No oral lesions, sore throat.  GI: see HPI.  Heme/Lymph: No easy bruising.  CV: No chest pain.  GU: No hematuria.  Integumentary: No rashes.  Neuro: No headaches.  Psych: No depression/anxiety.  Endocrine: No heat/cold intolerance.  Allergic/Immunologic: No urticaria.  Resp: No cough, SOB.  Musculoskeletal: No joint swelling.    Physical Examination: BP 111/76 (BP Location: Left Arm)   Pulse 71   Temp 98.4 F (36.9 C) (Oral)   Resp 16   Ht 6\' 1"  (1.854 m)   Wt 104.3 kg   SpO2 97%   BMI 30.34 kg/m   Mildly-ill appearing male in hospital bed. Wife present. Pt answers all questions appropriately.  Gen: NAD, alert and oriented x 4 HEENT: PEERLA, EOMI, Neck: supple, no JVD or thyromegaly Chest: CTA bilaterally, no wheezes, crackles, or other adventitious sounds CV: RRR, no m/g/c/r Abd: soft, ND, +BS in all four quadrants; mild tenderness to deep palpation in epigastric region, no HSM, guarding, ridigity, or rebound tenderness, negative Murphy's sign Ext: no edema, well perfused with 2+ pulses, Skin: no rash or lesions noted Lymph: no LAD  Data: Lab Results  Component Value Date   WBC 8.6 06/30/2020   HGB 12.6 (L) 06/30/2020   HCT 37.0 (L) 06/30/2020   MCV 92.5 06/30/2020   PLT 210 06/30/2020   Recent Labs  Lab 06/29/20 0514  06/30/20 0000  HGB 13.9 12.6*   Lab Results  Component Value Date   NA 142 06/30/2020   K 3.7 06/30/2020   CL 110 06/30/2020   CO2 23 06/30/2020   BUN 9 06/30/2020   CREATININE 1.11 06/30/2020   Lab Results  Component Value Date   ALT 12 06/29/2020   AST 16 06/29/2020   ALKPHOS 83 06/29/2020   BILITOT 0.9 06/29/2020   No results for input(s): APTT, INR, PTT in the last 168 hours. Assessment/Plan:  49 y/o Caucasian male with a PMH of HTN, HLD, GERD,  hypothyroidism, depression, CAD, Hx of MI s/p stent placement 50/1586, systolic CHF with EF 82-57% presented to the ED yesterday for 10-day history of progressive epigastric abd pain with nausea and vomiting  1. Epigastric abdominal pain 2. Non-intractable nausea and vomiting 3. GERD - on Protonix 40 mg daily at home 4. Hx of myocardial infarction s/p stent placement 06/2017 - on ASA 81  -DDx includes viral/bacterial gastroenteritis, gastritis +/- H pylori, peptic ulcer disease, esophagitis, duodenitis, functional dyspepsia, biliary dyskinesia, other functional GI disorder -Agree with acid suppression therapy -Advise endoscopy for luminal evaluation and biopsies of esophagus, stomach, or duodenum for further evaluation. -Plan for EGD this afternoon with Dr. Alice Reichert -If EGD is negative, consider further work-up with HIDA scan -See procedure note for findings and further recommendations -NPO  I reviewed the risks (including bleeding, perforation, infection, anesthesia complications, cardiac/respiratory complications), benefits and alternatives of EGD. Patient consents to proceed.    Thank you for the consult. Please call with questions or concerns.  Re[facility] For[facility] [phone] (Cell)

## 2020-06-30 NOTE — Transfer of Care (Signed)
Immediate Anesthesia Transfer of Care Note  Patient: Devine Klingel  Procedure(s) Performed: ESOPHAGOGASTRODUODENOSCOPY (EGD) (N/A )  Patient Location: PACU and Endoscopy Unit  Anesthesia Type:General  Level of Consciousness: awake  Airway & Oxygen Therapy: Patient Spontanous Breathing  Post-op Assessment: Report given to RN  Post vital signs: stable  Last Vitals:  Vitals Value Taken Time  BP 112/72 06/30/20 1442  Temp 36 C 06/30/20 1442  Pulse 77 06/30/20 1445  Resp 17 06/30/20 1445  SpO2 93 % 06/30/20 1445  Vitals shown include unvalidated device data.  Last Pain:  Vitals:   06/30/20 1442  TempSrc: Temporal  PainSc: Asleep         Complications: No complications documented.

## 2020-06-30 NOTE — Hospital Course (Signed)
Bobby [name] is a 49 y.o. male with medical history significant of hypertension, hyperlipidemia, GERD, hypothyroidism, depression with anxiety, CAD, myocardial infarction, stent placement, cardiogenic shock, hypothyroidism, sCHF with EF 30-35%, who presented to the ED on 06/29/20 with nausea vomiting, diarrhea and abdominal pain.  He reported daily episodes in the morning on sudden onset nausea/vomiting, non-bloody and non-bilious, in addition to at least 3 episodes of water diarrhea daily that seem to consistently occur very shortly after eating or drinking.  Reports associated epigastric pain which was non-radiating by last night had radiation to the back.  Reports chills but no known fevers.  The morning nausea started 1-2 months ago, but was mild and never lasted long.  However, this has become progressively worse, with recent episodes lasting for 6 hours or longer.  Reports about 20 lbs weight loss since this started.  He denies any history of stomach or digestive troubles.    In the ED, soft BP at 98/66, but otherwise vitals stable. C diff and GI pathogen panels both negative.  No leukocytosis.  Mildly elevated troponin down-trended.  RUQ ultrasound was negative.  Admitted to hospitalist service for further evaluation and management.  GI was consulted, and plan for EGD.

## 2020-06-30 NOTE — Anesthesia Preprocedure Evaluation (Signed)
Anesthesia Evaluation  Patient identified by MRN, date of birth, ID band Patient awake    Reviewed: Allergy & Precautions, NPO status , Patient's Chart, lab work & pertinent test results  Airway Mallampati: II  TM Distance: >3 FB     Dental  (+) Missing, Chipped, Poor Dentition   Pulmonary former smoker,    Pulmonary exam normal        Cardiovascular hypertension, + CAD, + Past MI and +CHF  Normal cardiovascular exam     Neuro/Psych PSYCHIATRIC DISORDERS Anxiety Depression    GI/Hepatic Neg liver ROS, GERD  ,  Endo/Other  Hypothyroidism Hyperthyroidism   Renal/GU negative Renal ROS  negative genitourinary   Musculoskeletal negative musculoskeletal ROS (+)   Abdominal Normal abdominal exam  (+)   Peds negative pediatric ROS (+)  Hematology negative hematology ROS (+)   Anesthesia Other Findings Past Medical History: 07/13/2017: Acute MI anterior wall first episode care [facility])     Comment:  Occluded ostial LAD S/P 3.5x26 Onyx DES. RI 80%. RCA               Occluded at prior stent site with collaterals  07/13/2017: Cardiogenic shock ([facility]) No date: CHF (congestive heart failure) ([facility]) No date: Graves disease No date: Hypertension No date: MI (myocardial infarction) ([place]) No date: Thyroid disease     Comment:  thyroid irradiated per pt  Reproductive/Obstetrics                             Anesthesia Physical Anesthesia Plan  ASA: III  Anesthesia Plan: General   Post-op Pain Management:    Induction: Intravenous  PONV Risk Score and Plan:   Airway Management Planned: Nasal Cannula  Additional Equipment:   Intra-op Plan:   Post-operative Plan:   Informed Consent: I have reviewed the patients History and Physical, chart, labs and discussed the procedure including the risks, benefits and alternatives for the proposed anesthesia with the patient or authorized representative who has  indicated his/her understanding and acceptance.     Dental advisory given  Plan Discussed with: CRNA and Surgeon  Anesthesia Plan Comments:         Anesthesia Quick Evaluation

## 2020-06-30 NOTE — H&P (View-Only) (Signed)
GI Inpatient Consult Note  Reason for Consult: Epigastric abdominal pain, nausea/vomiting    Attending Requesting Consult: Dr. Nicole Kindred, DO  History of Present Illness: Bobby [name] is a 49 y.o. male seen for evaluation of epigastric abdominal pain and non-intractable nausea and vomiting at the request of Dr. Arbutus Ped. Pt has a PMH of HTN, HLD, GERD, hypothyroidism, depression, CAD, Hx of MI s/p stent placement 36/4680, systolic CHF with EF 32-12%. Patient presented to the ED yesterday morning for a 10-day history of progressive epigastric abdominal pain with associated symptoms of non-intractable nausea and vomiting, abdominal bloating, and diarrhea. He reports over the past 10 days he has been experiencing severe nausea, vomiting, and epigastric abdominal pain usually starting in morning about one hour after taking his thyroid medication. He can have sudden onset nausea and NBNB emesis about 3-4 times in the morning with appx 3 episodes of watery diarrhea that comes on within thirty minutes after breakfast. He describes epigastric abdominal pain like an aching sensation located in his epigastric region which is non-radiating, but did radiate to his back last night. He has never had pain like this before. Pain can come on and last for 5-6 hours at a time. Pain seems to be constant over the past few days. He denies any dysphagia, odynophagia, retrosternal chest pain, hematochezia, or melena. No sick contacts or new changes to medications or supplements. No frequent NSAID use or stressors. He reports prior to symptom onset he did feel like he had a 2-day period of worsening reflux and pyrosis, but this resolved and hasn't occurred since. He takes Protonix 40 mg daily at home for GERD and has been on this regimen for many years. He denies fevers, but has noticed severe chills. He reports his appetite has been severely reduced and reports subjective weight loss of 15-20 lbs. No prior history of stomach  ulcers or gastritis. In the ED, he had soft BP of 98/66 with otherwise vital signs within normal limits. He had GI PCR and c diff return negative for infectious agents. Labs showed no evidence of leukocytosis, anemia, mildly elevated troponins which were down-trended. RUQ US showed no evidence of cholelithiasis, gallbladder wall thickening, or pericholecystic fluid. GI was consulted due to these symptoms for consideration of EGD. No prior endoscopy.    Past Medical History:  Past Medical History:  Diagnosis Date  . Acute MI anterior wall first episode care [facility]) 07/13/2017   Occluded ostial LAD S/P 3.5x26 Onyx DES. RI 80%. RCA Occluded at prior stent site with collaterals   . Cardiogenic shock ([place]) 07/13/2017  . CHF (congestive heart failure) ([place])   . Graves disease   . Hypertension   . MI (myocardial infarction) ([place])   . Thyroid disease    thyroid irradiated per pt    Problem List: Patient Active Problem List   Diagnosis Date Noted  . Nausea vomiting and diarrhea 06/29/2020  . Abdominal pain 06/29/2020  . Elevated troponin 06/29/2020  . HLD (hyperlipidemia) 06/29/2020  . GERD (gastroesophageal reflux disease) 06/29/2020  . Hypothyroidism 06/29/2020  . CAD (coronary artery disease) 06/29/2020  . Chronic systolic CHF (congestive heart failure) ([place]) 06/29/2020  . Myalgia 05/21/2019  . CHF (congestive heart failure), NYHA class II, chronic, combined ([place]) 02/20/2019  . Ischemic cardiomyopathy 02/20/2019  . Bruit of right carotid artery 02/20/2019  . Laboratory examination 02/20/2019  . Post PTCA 07/13/2017  . Unspecified hypothyroidism 08/27/2013  . Tachycardia 03/15/2012  . Coronary artery disease involving native coronary artery of  native heart without angina pectoris 03/15/2012  . HTN (hypertension) 03/15/2012  . Depression with anxiety 03/15/2012    Past Surgical History: Past Surgical History:  Procedure Laterality Date  . CORONARY ANGIOPLASTY WITH STENT PLACEMENT    .  CORONARY/GRAFT ACUTE MI REVASCULARIZATION N/A 07/13/2017   Procedure: Coronary/Graft Acute MI Revascularization;  Surgeon: Adrian Prows, MD;  Location: [place] [facility];  Service: Cardiovascular;  Laterality: N/A;  . LEFT HEART CATH AND CORONARY ANGIOGRAPHY N/A 07/13/2017   Procedure: Left Heart Cath and Coronary Angiography;  Surgeon: Adrian Prows, MD;  Location: [place] [facility];  Service: Cardiovascular;  Laterality: N/A;  . LEFT HEART CATH AND CORONARY ANGIOGRAPHY N/A 08/21/2017   Procedure: LEFT HEART CATH AND CORONARY ANGIOGRAPHY;  Surgeon: Nigel Mormon, MD;  Location: [place] [facility];  Service: Cardiovascular;  Laterality: N/A;  . SKIN GRAFT    . VENTRICULAR ASSIST DEVICE INSERTION N/A 07/13/2017   Procedure: Ventricular Assist Device Insertion;  Surgeon: Adrian Prows, MD;  Location: [place] [facility];  Service: Cardiovascular;  Laterality: N/A;    Allergies: No Known Allergies  Home Medications: Medications Prior to Admission  Medication Sig Dispense Refill Last Dose  . aspirin 81 MG chewable tablet TAKE 1 TABLET BY MOUTH EVERY DAY (Patient taking differently: Chew 81 mg by mouth daily. ) 30 tablet 11 06/28/2020 at 2200  . atorvastatin (LIPITOR) 20 MG tablet TAKE 1 TABLET BY MOUTH EVERY DAY (Patient taking differently: Take 20 mg by mouth at bedtime. ) 90 tablet 0 06/28/2020 at 2200  . carvedilol (COREG) 3.125 MG tablet TAKE 1 TABLET (3.125 MG TOTAL) BY MOUTH 2 (TWO) TIMES DAILY. 90 tablet 3 06/29/2020 at 1200  . ENTRESTO 24-26 MG TAKE 1 TABLET BY MOUTH TWICE A DAY 60 tablet 11 06/29/2020 at 1200  . Evolocumab (REPATHA) 140 MG/ML SOSY Inject 140 mg into the skin every 14 (fourteen) days. 2.1 mL 6 Past Month at Unknown time  . ezetimibe (ZETIA) 10 MG tablet TAKE 1 TABLET BY MOUTH EVERY DAY (Patient taking differently: Take 10 mg by mouth daily. ) 30 tablet 11 06/28/2020 at 2200  . FLUoxetine (PROZAC) 40 MG capsule Take 40 mg by mouth daily.    06/29/2020 at 1200  . ivabradine (CORLANOR) 5 MG  TABS tablet Take 0.5 tablets (2.5 mg total) by mouth 2 (two) times daily. 90 tablet 3 06/29/2020 at 1200  . levothyroxine (SYNTHROID, LEVOTHROID) 137 MCG tablet Take 137 mcg by mouth daily before breakfast.   06/28/2020 at 0700  . Multiple Vitamin (MULTIVITAMIN WITH MINERALS) TABS tablet Take 1 tablet by mouth daily.   06/28/2020 at 0800  . pantoprazole (PROTONIX) 40 MG tablet Take 40 mg by mouth daily.    06/28/2020 at 2200  . Cholecalciferol (VITAMIN D) 2000 units CAPS Take 2,000 Units by mouth daily.  (Patient not taking: Reported on 06/29/2020)   Not Taking at Unknown time  . Coenzyme Q10 (COQ10 PO) Take by mouth daily. (Patient not taking: Reported on 06/29/2020)   Not Taking at Unknown time  . nitroGLYCERIN (NITROSTAT) 0.4 MG SL tablet Place 1 tablet (0.4 mg total) under the tongue every 5 (five) minutes as needed for chest pain. 30 tablet 2 prn at prn  . sildenafil (VIAGRA) 100 MG tablet TAKE 1 (ONE) TABLET AS NEEDED. DO NOT USE AT SAME TIME AS NITRO (Patient taking differently: Take 100 mg by mouth as needed. TAKE 1 (ONE) TABLET AS NEEDED. DO NOT USE AT SAME TIME AS NITRO) 30 tablet  3 prn at prn  . Vitamins/Minerals TABS Take by mouth. (Patient not taking: Reported on 06/29/2020)   Not Taking   Home medication reconciliation was completed with the patient.   Scheduled Inpatient Medications:   . aspirin  81 mg Oral Daily  . atorvastatin  20 mg Oral QHS  . carvedilol  3.125 mg Oral BID  . enoxaparin (LOVENOX) injection  40 mg Subcutaneous Q24H  . ezetimibe  10 mg Oral Daily  . FLUoxetine  40 mg Oral Daily  . ivabradine  2.5 mg Oral BID  . levothyroxine  137 mcg Oral QAC breakfast  . multivitamin with minerals  1 tablet Oral Daily  . pantoprazole (PROTONIX) IV  40 mg Intravenous Q12H  . sacubitril-valsartan  1 tablet Oral BID    Continuous Inpatient Infusions:   . sodium chloride 50 mL/hr at 06/30/20 1054    PRN Inpatient Medications:  acetaminophen, morphine injection, nitroGLYCERIN,  ondansetron (ZOFRAN) IV  Family History: family history includes Heart disease in his father and paternal grandmother; Hypertension in his father.  The patient's family history is negative for inflammatory bowel disorders, GI malignancy, or solid organ transplantation.  Social History:   reports that he quit smoking about 2 years ago. His smoking use included cigarettes. He smoked 0.50 packs per day. He has never used smokeless tobacco. He reports current alcohol use. He reports that he does not use drugs. The patient denies ETOH, tobacco, or drug use.   Review of Systems: Constitutional: Weight is stable.  Eyes: No changes in vision. ENT: No oral lesions, sore throat.  GI: see HPI.  Heme/Lymph: No easy bruising.  CV: No chest pain.  GU: No hematuria.  Integumentary: No rashes.  Neuro: No headaches.  Psych: No depression/anxiety.  Endocrine: No heat/cold intolerance.  Allergic/Immunologic: No urticaria.  Resp: No cough, SOB.  Musculoskeletal: No joint swelling.    Physical Examination: BP 111/76 (BP Location: Left Arm)   Pulse 71   Temp 98.4 F (36.9 C) (Oral)   Resp 16   Ht 6\' 1"  (1.854 m)   Wt 104.3 kg   SpO2 97%   BMI 30.34 kg/m   Mildly-ill appearing male in hospital bed. Wife present. Pt answers all questions appropriately.  Gen: NAD, alert and oriented x 4 HEENT: PEERLA, EOMI, Neck: supple, no JVD or thyromegaly Chest: CTA bilaterally, no wheezes, crackles, or other adventitious sounds CV: RRR, no m/g/c/r Abd: soft, ND, +BS in all four quadrants; mild tenderness to deep palpation in epigastric region, no HSM, guarding, ridigity, or rebound tenderness, negative Murphy's sign Ext: no edema, well perfused with 2+ pulses, Skin: no rash or lesions noted Lymph: no LAD  Data: Lab Results  Component Value Date   WBC 8.6 06/30/2020   HGB 12.6 (L) 06/30/2020   HCT 37.0 (L) 06/30/2020   MCV 92.5 06/30/2020   PLT 210 06/30/2020   Recent Labs  Lab 06/29/20 0514  06/30/20 0000  HGB 13.9 12.6*   Lab Results  Component Value Date   NA 142 06/30/2020   K 3.7 06/30/2020   CL 110 06/30/2020   CO2 23 06/30/2020   BUN 9 06/30/2020   CREATININE 1.11 06/30/2020   Lab Results  Component Value Date   ALT 12 06/29/2020   AST 16 06/29/2020   ALKPHOS 83 06/29/2020   BILITOT 0.9 06/29/2020   No results for input(s): APTT, INR, PTT in the last 168 hours. Assessment/Plan:  49 y/o Caucasian male with a PMH of HTN, HLD, GERD,  hypothyroidism, depression, CAD, Hx of MI s/p stent placement 87/6811, systolic CHF with EF 57-26% presented to the ED yesterday for 10-day history of progressive epigastric abd pain with nausea and vomiting  1. Epigastric abdominal pain 2. Non-intractable nausea and vomiting 3. GERD - on Protonix 40 mg daily at home 4. Hx of myocardial infarction s/p stent placement 06/2017 - on ASA 81  -DDx includes viral/bacterial gastroenteritis, gastritis +/- H pylori, peptic ulcer disease, esophagitis, duodenitis, functional dyspepsia, biliary dyskinesia, other functional GI disorder -Agree with acid suppression therapy -Advise endoscopy for luminal evaluation and biopsies of esophagus, stomach, or duodenum for further evaluation. -Plan for EGD this afternoon with Dr. Alice Reichert -If EGD is negative, consider further work-up with HIDA scan -See procedure note for findings and further recommendations -NPO  I reviewed the risks (including bleeding, perforation, infection, anesthesia complications, cardiac/respiratory complications), benefits and alternatives of EGD. Patient consents to proceed.    Thank you for the consult. Please call with questions or concerns.  Reeves Forth [facility] [phone] (Cell)

## 2020-06-30 NOTE — Interval H&P Note (Signed)
History and Physical Interval Note:  06/30/2020 2:27 PM  Bobby [name]  has presented today for surgery, with the diagnosis of Non-intractable nausea and vomiting, epigastric abdominal pain, GERD.  The various methods of treatment have been discussed with the patient and family. After consideration of risks, benefits and other options for treatment, the patient has consented to  Procedure(s): ESOPHAGOGASTRODUODENOSCOPY (EGD) (N/A) as a surgical intervention.  The patient's history has been reviewed, patient examined, no change in status, stable for surgery.  I have reviewed the patient's chart and labs.  Questions were answered to the patient's satisfaction.     [place], [place]

## 2020-06-30 NOTE — Plan of Care (Signed)

## 2020-07-01 ENCOUNTER — Telehealth: Payer: Self-pay

## 2020-07-01 ENCOUNTER — Encounter: Payer: Self-pay | Admitting: Internal Medicine

## 2020-07-01 NOTE — Telephone Encounter (Signed)
Patient's wife called and stated she would like for you to review recent lab work collected at most recent hospital visit. Patient has a f/u with you on 7/19. Wife would like to know if pt should come earlier. Please advise! Thanks

## 2020-07-01 NOTE — Anesthesia Postprocedure Evaluation (Signed)
Anesthesia Post Note  Patient: Bobby [name]  Procedure(s) Performed: ESOPHAGOGASTRODUODENOSCOPY (EGD) (N/A )  Patient location during evaluation: Endoscopy Anesthesia Type: General Level of consciousness: awake and alert and oriented Pain management: pain level controlled Vital Signs Assessment: post-procedure vital signs reviewed and stable Respiratory status: spontaneous breathing Cardiovascular status: blood pressure returned to baseline Anesthetic complications: no   No complications documented.   Last Vitals:  Vitals:   06/30/20 1452 06/30/20 1534  BP: 100/72 122/84  Pulse:  70  Resp:  17  Temp:  36.8 C  SpO2:  98%    Last Pain:  Vitals:   06/30/20 1534  TempSrc: Oral  PainSc:                  Chelsae Zanella

## 2020-07-01 NOTE — Telephone Encounter (Signed)
Hospital chart reviewed. Primary issue seems to be nausea, vomiting. Follow GI recommendations. Troponin is mildly elevated, but is nonspecific. EKG shows no significant abnormalities. Okay to keep appt on 7/19.  Thanks MJP

## 2020-07-02 ENCOUNTER — Telehealth: Payer: Self-pay

## 2020-07-02 LAB — SURGICAL PATHOLOGY

## 2020-07-02 NOTE — Telephone Encounter (Signed)
Relayed information to spouse, spouse voiced understanding.

## 2020-07-08 DIAGNOSIS — G4733 Obstructive sleep apnea (adult) (pediatric): Secondary | ICD-10-CM | POA: Diagnosis not present

## 2020-07-12 ENCOUNTER — Other Ambulatory Visit: Payer: Self-pay | Admitting: Cardiology

## 2020-07-12 ENCOUNTER — Other Ambulatory Visit: Payer: Self-pay

## 2020-07-12 ENCOUNTER — Encounter: Payer: Self-pay | Admitting: Cardiology

## 2020-07-12 ENCOUNTER — Ambulatory Visit: Payer: 59 | Admitting: Cardiology

## 2020-07-12 VITALS — BP 117/76 | HR 61 | Resp 17 | Ht 73.0 in | Wt 210.0 lb

## 2020-07-12 DIAGNOSIS — I2510 Atherosclerotic heart disease of native coronary artery without angina pectoris: Secondary | ICD-10-CM

## 2020-07-12 DIAGNOSIS — I5032 Chronic diastolic (congestive) heart failure: Secondary | ICD-10-CM | POA: Diagnosis not present

## 2020-07-12 DIAGNOSIS — I5023 Acute on chronic systolic (congestive) heart failure: Secondary | ICD-10-CM

## 2020-07-12 DIAGNOSIS — I25110 Atherosclerotic heart disease of native coronary artery with unstable angina pectoris: Secondary | ICD-10-CM

## 2020-07-12 MED ORDER — SPIRONOLACTONE 25 MG PO TABS: 12.5000 mg | ORAL_TABLET | Freq: Every day | ORAL | 3 refills | Status: DC

## 2020-07-12 NOTE — Progress Notes (Signed)
Patient is here for follow up visit.  Subjective:   Bobby Bobby [name], male    DOB: 10-25-1971, 49 y.o.   MRN: [id number]   Chief Complaint  Patient presents with  . Coronary Artery Disease  . Follow-up    3 month  . Results    HPI   49 year old Caucasian male with coronary artery disease status post anterior wall MI 06/2017 s/p successful LAD revascularization, prior RCA stent now with CTO, EF 43% on MRI in 10/2017 with no reversible ischemia.  Patient was hospitalized in early July 2021 with nausea, vomiting, and diarrhea. Patient underwent EGD that showed erythematous mucosa in stomach antrum, and duodenum. Biopsy was taken. Prontonix and outpatient f/u were recommended. Pathology showed no malignancy.   On further questioning, it appears that patient had actually developed symptoms of severe fatigue, shortness of breath and throat pain similar to his MI, a week prior to his hospitalization.  His BNP was elevated at 450, and high-sensitivity troponin mildly elevated and flat in the low 20s during his hospitalization.  Since discharge, he has not done any significant physical activity.   Current Outpatient Medications on File Prior to Visit  Medication Sig Dispense Refill  . aspirin 81 MG chewable tablet TAKE 1 TABLET BY MOUTH EVERY DAY (Patient taking differently: Chew 81 mg by mouth daily. ) 30 tablet 11  . atorvastatin (LIPITOR) 20 MG tablet TAKE 1 TABLET BY MOUTH EVERY DAY (Patient taking differently: Take 20 mg by mouth at bedtime. ) 90 tablet 0  . carvedilol (COREG) 3.125 MG tablet TAKE 1 TABLET (3.125 MG TOTAL) BY MOUTH 2 (TWO) TIMES DAILY. 90 tablet 3  . Cholecalciferol (VITAMIN D) 2000 units CAPS Take 2,000 Units by mouth daily.     . Coenzyme Q10 (COQ10 PO) Take by mouth daily.     Marland Kitchen ENTRESTO 24-26 MG TAKE 1 TABLET BY MOUTH TWICE A DAY 60 tablet 11  . Evolocumab (REPATHA) 140 MG/ML SOSY Inject 140 mg into the skin every 14 (fourteen) days. 2.1 mL 6  . ezetimibe (ZETIA) 10  MG tablet TAKE 1 TABLET BY MOUTH EVERY DAY (Patient taking differently: Take 10 mg by mouth daily. ) 30 tablet 11  . FLUoxetine (PROZAC) 40 MG capsule Take 40 mg by mouth daily.     . ivabradine (CORLANOR) 5 MG TABS tablet Take 0.5 tablets (2.5 mg total) by mouth 2 (two) times daily. 90 tablet 3  . levothyroxine (SYNTHROID, LEVOTHROID) 137 MCG tablet Take 137 mcg by mouth daily before breakfast.    . Multiple Vitamin (MULTIVITAMIN WITH MINERALS) TABS tablet Take 1 tablet by mouth daily.    . pantoprazole (PROTONIX) 40 MG tablet Take 40 mg by mouth daily.     . nitroGLYCERIN (NITROSTAT) 0.4 MG SL tablet Place 1 tablet (0.4 mg total) under the tongue every 5 (five) minutes as needed for chest pain. (Patient not taking: Reported on 07/12/2020) 30 tablet 2  . ondansetron (ZOFRAN ODT) 8 MG disintegrating tablet Take 1 tablet (8 mg total) by mouth every 8 (eight) hours as needed for nausea or vomiting. (Patient not taking: Reported on 07/12/2020) 20 tablet 0  . sildenafil (VIAGRA) 100 MG tablet TAKE 1 (ONE) TABLET AS NEEDED. DO NOT USE AT SAME TIME AS NITRO (Patient not taking: Reported on 07/12/2020) 30 tablet 3   No current facility-administered medications on file prior to visit.    Cardiovascular studies:  Real-time Outpatient Cardiac Telemetry 04/01/2020 - 04/15/2020: Dominant rhythm: Sinus. HR 44-150 bpm. Avg  HR 72 bpm. Supraventricular ectopy, PAC/upto 6 beat run. SVE burden <1% Ventricular ectopy, PVC, NSVT upto 5 beats. VE burden <1% No atrial fibrillation/atrial flutter/SVT/high grade AV block, sinus pause >3sec noted. 12 patient activated events do not correlate with arrhthymias.    EKG 02/20/2019: Sinus rhythm 64 bpm. Old anteroseptal infarct.   Stress perfusion MRI 11/07/2017: 1. Moderately dilated left ventricle with normal wall thickness and moderately decreased systolic function (LVEF = 43%). There is hypokinesis of the basal anteroseptal, anterior walls and akinesis of the mid  inferoseptal, anteroseptal, anterior, all apical walls including true apex. A large infarct in the basal and mid inferior, inferoseptal, anteroseptal, anterior walls and in all the apical walls with no ischemia. 2. Normal right ventricular size, thickness and systolic function (LVEF = 56%). There are no regional wall motion abnormalities. 3.  Mildly dilated left atrium. 4. Mild mitral and tricuspid regurgitation. Collectively, these findings are consistent with an ischemic cardiomyopathy with a large infarct in the LAD and RCA territory with no peri-infarct ischemia. There is poor chance of recovery if Revascularized.  Cath 08/21/2017: LM: Normal LAD: Patent ostial LAD stent. 20% ostial diagonal stenosis Ramus: 50% proximal stenosis RCA: Not engaged today. Known RCA CTO   Elevated LVEDP LVEF 45%   Continue aggressive medical therapy  Coronary angiogram 07/13/2017: Elevated LVEDP at 24 mmHg, systemic pressures 80/60 mmHg, patient in cardiogenic shock. Left main normal, LAD flush occluded. Ramus intermediate mid 70-80% % stenosis, mild disease in the circumflex. RCA occluded at the prior angioplasty site. Contralateral collaterals from the LAD.    Large LAD and diagonal, successful angioplasty and stenting following thrombectomy with implantation of 3.5 x 26 mm resolute Onyx DES deployed at 14 atmospheric pressure. Diagonal 1 ostial stenosis balloon angioplasty performed with 2.5 mm balloon at 10 atmospheric pressure. Stenosis reduced to 0%, mid flow improved from 0-3.  Carotid artery duplex 10/05/2017: No hemodynamically significant arterial disease in the internal carotid artery bilaterally. Normal study.  Antegrade right vertebral artery flow. Antegrade left vertebral artery flow.  Recent labs: 06/30/2020: Glucose 107, BUN/Cr 9/1.1. EGFR >60. Na/K 142/3.7.  H/H 12.6/37. MCV 92. Platelets 210 HbA1C 6.1% Chol 97, TG 96, HDL 32, LDL 46  Results for Bobby, Bobby [name] (MRN [id number]) as  of 07/12/2020 11:38  Ref. Range 06/29/2020 05:19 06/29/2020 11:08 06/29/2020 18:39 06/30/2020 00:00  B Natriuretic Peptide Latest Ref Range: 0.0 - 100.0 pg/mL 457.8 (H)     Troponin I (High Sensitivity) Latest Ref Range: <18 ng/L  28 (H) 29 (H) 25 (H)    03/16/2020: Glucose 121, BUN/Cr 15/1.0. EGFR 86. Na/K 141/4.2. Rest of the CMP normal H/H 15/45. MCV 92. Platelets 206 Chol 165, TG 325, HDL 39, LDL 74  06/2017: HbA1C 6.Marland Kitchen0% TSH 1.0 normal   Review of Systems  Constitutional: Positive for malaise/fatigue.  Cardiovascular: Negative for chest pain, dyspnea on exertion, leg swelling, palpitations and syncope.       Objective:    Vitals:   07/12/20 1552  BP: 117/76  Pulse: 61  Resp: 17  SpO2: 97%     Physical Exam Vitals and nursing note reviewed.  Constitutional:      Appearance: He is well-developed.  Neck:     Vascular: No JVD.  Cardiovascular:     Rate and Rhythm: Normal rate and regular rhythm.     Pulses: Intact distal pulses.     Heart sounds: Normal heart sounds. No murmur heard.   Pulmonary:     Effort: Pulmonary effort is normal.  Breath sounds: Normal breath sounds. No wheezing or rales.         Assessment & Recommendations:   49 year old Caucasian male with coronary artery disease status post anterior wall MI 06/2017 s/p successful LAD revascularization, prior RCA stent now with CTO, EF 43% on MRI in 10/2017 with no reversible ischemia.  Ischemic cardiomyopathy: Acute on chronic systolic heart failure.  While his physical exam is underwhelming, his elevated BNP, along with predominant GI symptoms possibly suggest intestinal edema. Started spironolactone 12.5 mg daily. Continue carvedilol 3.125 mg twice daily, Entresto 24-26 mg twice daily, ivabradine 2.5 mg twice daily. Check CMP, NT proBNP in 1 week.  CAD: It is possible that patient had an ACS event 1 week prior to his hospitalization.  Therefore, we were catching the tail end of his high-sensitivity  troponin.  I will obtain exercise nuclear stress test.  Okay to continue bisoprolol given his established diagnosis of CAD. Continue aspirin 81, Lipitor 20, Repatha.  LDL down to 46, triglycerides down to 96. We will check lipoprotein a.    Irregular heart beat: No A. fib noted on cardiac telemetry.  Follow-up in 2 weeks.  Bobby Mormon, MD [facility] Pager: [phone] Office: [phone] If no answer Cell [phone]

## 2020-07-16 ENCOUNTER — Other Ambulatory Visit: Payer: Self-pay

## 2020-07-16 ENCOUNTER — Other Ambulatory Visit: Payer: BC Managed Care – PPO

## 2020-07-16 ENCOUNTER — Ambulatory Visit: Payer: 59

## 2020-07-16 DIAGNOSIS — I5023 Acute on chronic systolic (congestive) heart failure: Secondary | ICD-10-CM | POA: Diagnosis not present

## 2020-07-18 ENCOUNTER — Other Ambulatory Visit: Payer: Self-pay | Admitting: Cardiology

## 2020-07-18 DIAGNOSIS — I255 Ischemic cardiomyopathy: Secondary | ICD-10-CM

## 2020-07-26 ENCOUNTER — Ambulatory Visit: Payer: 59

## 2020-07-26 ENCOUNTER — Other Ambulatory Visit: Payer: Self-pay

## 2020-07-26 DIAGNOSIS — I25110 Atherosclerotic heart disease of native coronary artery with unstable angina pectoris: Secondary | ICD-10-CM

## 2020-07-27 ENCOUNTER — Other Ambulatory Visit: Payer: Self-pay | Admitting: Cardiology

## 2020-07-27 DIAGNOSIS — I25110 Atherosclerotic heart disease of native coronary artery with unstable angina pectoris: Secondary | ICD-10-CM

## 2020-07-27 NOTE — Progress Notes (Signed)
Called patient to discuss stress test results. Left voicemail.

## 2020-07-28 ENCOUNTER — Other Ambulatory Visit: Payer: Self-pay | Admitting: Cardiology

## 2020-07-28 DIAGNOSIS — I2510 Atherosclerotic heart disease of native coronary artery without angina pectoris: Secondary | ICD-10-CM

## 2020-08-04 DIAGNOSIS — I25110 Atherosclerotic heart disease of native coronary artery with unstable angina pectoris: Secondary | ICD-10-CM | POA: Diagnosis not present

## 2020-08-04 DIAGNOSIS — I5023 Acute on chronic systolic (congestive) heart failure: Secondary | ICD-10-CM | POA: Diagnosis not present

## 2020-08-04 DIAGNOSIS — I2510 Atherosclerotic heart disease of native coronary artery without angina pectoris: Secondary | ICD-10-CM | POA: Diagnosis not present

## 2020-08-05 LAB — COMPREHENSIVE METABOLIC PANEL
ALT: 19 IU/L (ref 0–44)
AST: 15 IU/L (ref 0–40)
Albumin/Globulin Ratio: 2.3 — ABNORMAL HIGH (ref 1.2–2.2)
Albumin: 4.6 g/dL (ref 4.0–5.0)
Alkaline Phosphatase: 112 IU/L (ref 48–121)
BUN/Creatinine Ratio: 10 (ref 9–20)
BUN: 12 mg/dL (ref 6–24)
Bilirubin Total: 0.3 mg/dL (ref 0.0–1.2)
CO2: 24 mmol/L (ref 20–29)
Calcium: 10 mg/dL (ref 8.7–10.2)
Chloride: 105 mmol/L (ref 96–106)
Creatinine, Ser: 1.19 mg/dL (ref 0.76–1.27)
GFR calc Af Amer: 82 mL/min/{1.73_m2} (ref >59)
GFR calc non Af Amer: 71 mL/min/{1.73_m2} (ref >59)
Globulin, Total: 2 g/dL (ref 1.5–4.5)
Glucose: 131 mg/dL — ABNORMAL HIGH (ref 65–99)
Potassium: 5.5 mmol/L — ABNORMAL HIGH (ref 3.5–5.2)
Sodium: 143 mmol/L (ref 134–144)
Total Protein: 6.6 g/dL (ref 6.0–8.5)

## 2020-08-05 LAB — CBC
Hematocrit: 49.4 % (ref 37.5–51.0)
Hemoglobin: 16.1 g/dL (ref 13.0–17.7)
MCH: 30.6 pg (ref 26.6–33.0)
MCHC: 32.6 g/dL (ref 31.5–35.7)
MCV: 94 fL (ref 79–97)
Platelets: 193 10*3/uL (ref 150–450)
RBC: 5.27 x10E6/uL (ref 4.14–5.80)
RDW: 12.7 % (ref 11.6–15.4)
WBC: 6.3 10*3/uL (ref 3.4–10.8)

## 2020-08-05 LAB — LIPOPROTEIN A (LPA): Lipoprotein (a): <8.4 nmol/L (ref <75.0)

## 2020-08-05 NOTE — Progress Notes (Signed)
K 5.5 Avoid K risk foods such as banana, avocado.  Bobby [name], we should repeat K on the morning of his cath  Thanks MJP

## 2020-08-06 ENCOUNTER — Telehealth: Payer: Self-pay

## 2020-08-06 ENCOUNTER — Other Ambulatory Visit (HOSPITAL_COMMUNITY)
Admission: RE | Admit: 2020-08-06 | Discharge: 2020-08-06 | Disposition: A | Discharge status: Home / Self Care | Payer: BC Managed Care – PPO | Source: Ambulatory Visit | Attending: Cardiology | Admitting: Cardiology

## 2020-08-06 DIAGNOSIS — Z01812 Encounter for preprocedural laboratory examination: Secondary | ICD-10-CM | POA: Diagnosis not present

## 2020-08-06 DIAGNOSIS — Z20822 Contact with and (suspected) exposure to covid-19: Secondary | ICD-10-CM | POA: Diagnosis not present

## 2020-08-06 LAB — SARS CORONAVIRUS 2 (TAT 6-24 HRS): SARS Coronavirus 2: NEGATIVE

## 2020-08-06 NOTE — Progress Notes (Signed)
Called and spoke with patient, he showed understanding to avoid potassium rich foods and stated that he knows the cath is on Tuesday, but hasn't been told what time or been given any details on it.

## 2020-08-07 NOTE — Progress Notes (Signed)
Called patient to inform him of instructions for procedure on Tuesday.  No answer left voicemail, nothing to eat or drink after midnighton Monday, take all of AM meds of Tuesday.  Please arrive @ 9:30 Tuesday morning, procedure is scheduled for 11;30.

## 2020-08-08 DIAGNOSIS — G4733 Obstructive sleep apnea (adult) (pediatric): Secondary | ICD-10-CM | POA: Diagnosis not present

## 2020-08-09 ENCOUNTER — Ambulatory Visit: Payer: BC Managed Care – PPO | Admitting: Cardiology

## 2020-08-09 DIAGNOSIS — G4733 Obstructive sleep apnea (adult) (pediatric): Secondary | ICD-10-CM | POA: Diagnosis not present

## 2020-08-10 ENCOUNTER — Encounter (HOSPITAL_COMMUNITY)
Admission: RE | Disposition: A | Discharge status: Home / Self Care | Payer: Self-pay | Source: Home / Self Care | Attending: Cardiology

## 2020-08-10 ENCOUNTER — Other Ambulatory Visit: Payer: Self-pay

## 2020-08-10 ENCOUNTER — Ambulatory Visit (HOSPITAL_COMMUNITY)
Admission: RE | Admit: 2020-08-10 | Discharge: 2020-08-10 | Disposition: A | Discharge status: Home / Self Care | Payer: BC Managed Care – PPO | Attending: Cardiology | Admitting: Cardiology

## 2020-08-10 DIAGNOSIS — Z7982 Long term (current) use of aspirin: Secondary | ICD-10-CM | POA: Diagnosis not present

## 2020-08-10 DIAGNOSIS — I255 Ischemic cardiomyopathy: Secondary | ICD-10-CM | POA: Insufficient documentation

## 2020-08-10 DIAGNOSIS — I499 Cardiac arrhythmia, unspecified: Secondary | ICD-10-CM | POA: Diagnosis not present

## 2020-08-10 DIAGNOSIS — Z79899 Other long term (current) drug therapy: Secondary | ICD-10-CM | POA: Insufficient documentation

## 2020-08-10 DIAGNOSIS — I5023 Acute on chronic systolic (congestive) heart failure: Secondary | ICD-10-CM | POA: Insufficient documentation

## 2020-08-10 DIAGNOSIS — I252 Old myocardial infarction: Secondary | ICD-10-CM | POA: Insufficient documentation

## 2020-08-10 DIAGNOSIS — I2510 Atherosclerotic heart disease of native coronary artery without angina pectoris: Secondary | ICD-10-CM | POA: Diagnosis present

## 2020-08-10 DIAGNOSIS — Z79890 Hormone replacement therapy (postmenopausal): Secondary | ICD-10-CM | POA: Diagnosis not present

## 2020-08-10 DIAGNOSIS — I25119 Atherosclerotic heart disease of native coronary artery with unspecified angina pectoris: Secondary | ICD-10-CM | POA: Diagnosis present

## 2020-08-10 DIAGNOSIS — Z955 Presence of coronary angioplasty implant and graft: Secondary | ICD-10-CM | POA: Insufficient documentation

## 2020-08-10 DIAGNOSIS — I25118 Atherosclerotic heart disease of native coronary artery with other forms of angina pectoris: Secondary | ICD-10-CM | POA: Diagnosis not present

## 2020-08-10 HISTORY — PX: LEFT HEART CATH AND CORONARY ANGIOGRAPHY: CATH118249

## 2020-08-10 SURGERY — LEFT HEART CATH AND CORONARY ANGIOGRAPHY
Anesthesia: LOCAL

## 2020-08-10 MED ORDER — LABETALOL HCL 5 MG/ML IV SOLN: 10.0000 mg | INTRAVENOUS | Status: DC | PRN

## 2020-08-10 MED ORDER — MIDAZOLAM HCL 2 MG/2ML IJ SOLN
INTRAMUSCULAR | Status: AC
  Filled 2020-08-10: qty 2

## 2020-08-10 MED ORDER — ACETAMINOPHEN 325 MG PO TABS: 650.0000 mg | ORAL_TABLET | ORAL | Status: DC | PRN

## 2020-08-10 MED ORDER — SODIUM CHLORIDE 0.9 % IV SOLN: INTRAVENOUS | Status: DC

## 2020-08-10 MED ORDER — SODIUM CHLORIDE 0.9 % IV SOLN: 250.0000 mL | INTRAVENOUS | Status: DC | PRN

## 2020-08-10 MED ORDER — ONDANSETRON HCL 4 MG/2ML IJ SOLN: 4.0000 mg | Freq: Four times a day (QID) | INTRAMUSCULAR | Status: DC | PRN

## 2020-08-10 MED ORDER — ASPIRIN 81 MG PO CHEW: 81.0000 mg | CHEWABLE_TABLET | ORAL | Status: AC

## 2020-08-10 MED ORDER — VERAPAMIL HCL 2.5 MG/ML IV SOLN
INTRAVENOUS | Status: AC
  Filled 2020-08-10: qty 2

## 2020-08-10 MED ORDER — HEPARIN SODIUM (PORCINE) 1000 UNIT/ML IJ SOLN
INTRAMUSCULAR | Status: DC | PRN
  Administered 2020-08-10: 5000 [IU] via INTRAVENOUS

## 2020-08-10 MED ORDER — IOHEXOL 350 MG/ML SOLN
INTRAVENOUS | Status: DC | PRN
  Administered 2020-08-10: 45 mL

## 2020-08-10 MED ORDER — ASPIRIN 81 MG PO CHEW
CHEWABLE_TABLET | ORAL | Status: AC
  Administered 2020-08-10: 81 mg via ORAL
  Filled 2020-08-10: qty 1

## 2020-08-10 MED ORDER — SODIUM CHLORIDE 0.9% FLUSH: 3.0000 mL | INTRAVENOUS | Status: DC | PRN

## 2020-08-10 MED ORDER — LIDOCAINE HCL (PF) 1 % IJ SOLN
INTRAMUSCULAR | Status: AC
  Filled 2020-08-10: qty 30

## 2020-08-10 MED ORDER — SODIUM CHLORIDE 0.9% FLUSH: 3.0000 mL | Freq: Two times a day (BID) | INTRAVENOUS | Status: DC

## 2020-08-10 MED ORDER — HEPARIN (PORCINE) IN NACL 1000-0.9 UT/500ML-% IV SOLN
INTRAVENOUS | Status: AC
  Filled 2020-08-10: qty 1000

## 2020-08-10 MED ORDER — FENTANYL CITRATE (PF) 100 MCG/2ML IJ SOLN
INTRAMUSCULAR | Status: DC | PRN
  Administered 2020-08-10: 25 ug via INTRAVENOUS

## 2020-08-10 MED ORDER — MIDAZOLAM HCL 2 MG/2ML IJ SOLN
INTRAMUSCULAR | Status: DC | PRN
  Administered 2020-08-10: 1 mg via INTRAVENOUS

## 2020-08-10 MED ORDER — HEPARIN (PORCINE) IN NACL 1000-0.9 UT/500ML-% IV SOLN
INTRAVENOUS | Status: DC | PRN
  Administered 2020-08-10 (×2): 500 mL

## 2020-08-10 MED ORDER — VERAPAMIL HCL 2.5 MG/ML IV SOLN
INTRAVENOUS | Status: DC | PRN
  Administered 2020-08-10: 5 mL via INTRA_ARTERIAL

## 2020-08-10 MED ORDER — FENTANYL CITRATE (PF) 100 MCG/2ML IJ SOLN
INTRAMUSCULAR | Status: AC
  Filled 2020-08-10: qty 2

## 2020-08-10 MED ORDER — HYDRALAZINE HCL 20 MG/ML IJ SOLN: 10.0000 mg | INTRAMUSCULAR | Status: DC | PRN

## 2020-08-10 MED ORDER — SODIUM CHLORIDE 0.9 % IV SOLN
INTRAVENOUS | Status: AC | PRN
  Administered 2020-08-10: 250 mL via INTRAVENOUS

## 2020-08-10 MED ORDER — HEPARIN SODIUM (PORCINE) 1000 UNIT/ML IJ SOLN
INTRAMUSCULAR | Status: AC
  Filled 2020-08-10: qty 1

## 2020-08-10 MED ORDER — LIDOCAINE HCL (PF) 1 % IJ SOLN
INTRAMUSCULAR | Status: DC | PRN
  Administered 2020-08-10: 4 mL

## 2020-08-10 SURGICAL SUPPLY — 11 items
CATH INFINITI 5 FR JL3.5 (CATHETERS) ×2 IMPLANT
CATH OPTITORQUE TIG 4.0 5F (CATHETERS) ×2 IMPLANT
DEVICE RAD COMP TR BAND LRG (VASCULAR PRODUCTS) ×2 IMPLANT
GLIDESHEATH SLEND A-KIT 6F 22G (SHEATH) ×2 IMPLANT
GUIDEWIRE INQWIRE 1.5J.035X260 (WIRE) ×1 IMPLANT
INQWIRE 1.5J .035X260CM (WIRE) ×2
KIT HEART LEFT (KITS) ×2 IMPLANT
PACK CARDIAC CATHETERIZATION (CUSTOM PROCEDURE TRAY) ×2 IMPLANT
SHEATH PROBE COVER 6X72 (BAG) ×2 IMPLANT
TRANSDUCER W/STOPCOCK (MISCELLANEOUS) ×2 IMPLANT
TUBING CIL FLEX 10 FLL-RA (TUBING) ×2 IMPLANT

## 2020-08-10 NOTE — Interval H&P Note (Signed)
History and Physical Interval Note:  08/10/2020 11:12 AM  Margrett Rud  has presented today for surgery, with the diagnosis of CAD.  The various methods of treatment have been discussed with the patient and family. After consideration of risks, benefits and other options for treatment, the patient has consented to  Procedure(s): LEFT HEART CATH AND CORONARY ANGIOGRAPHY (N/A) as a surgical intervention.  The patient's history has been reviewed, patient examined, no change in status, stable for surgery.  I have reviewed the patient's chart and labs.  Questions were answered to the patient's satisfaction.    High risk stress test Angina On 2 anti anginal agents   Daequan Kozma J Macenzie Burford

## 2020-08-10 NOTE — H&P (Signed)
OV 7/19 copied for documentation     Patient is here for follow up visit.  Subjective:   Bobby [name], male    DOB: 03-Feb-1971, 49 y.o.   MRN: [id number]     HPI   49 year old Caucasian male with coronary artery disease status post anterior wall MI 06/2017 s/p successful LAD revascularization, prior RCA stent now with CTO, EF 43% on MRI in 10/2017 with no reversible ischemia.  Patient was hospitalized in early July 2021 with nausea, vomiting, and diarrhea. Patient underwent EGD that showed erythematous mucosa in stomach antrum, and duodenum. Biopsy was taken. Prontonix and outpatient f/u were recommended. Pathology showed no malignancy.   On further questioning, it appears that patient had actually developed symptoms of severe fatigue, shortness of breath and throat pain similar to his MI, a week prior to his hospitalization.  His BNP was elevated at 450, and high-sensitivity troponin mildly elevated and flat in the low 20s during his hospitalization.  Since discharge, he has not done any significant physical activity.   No current facility-administered medications on file prior to encounter.   Current Outpatient Medications on File Prior to Encounter  Medication Sig Dispense Refill  . aspirin 81 MG chewable tablet TAKE 1 TABLET BY MOUTH EVERY DAY (Patient taking differently: Chew 81 mg by mouth daily. ) 30 tablet 11  . atorvastatin (LIPITOR) 20 MG tablet TAKE 1 TABLET BY MOUTH EVERY DAY (Patient taking differently: Take 20 mg by mouth daily. ) 90 tablet 0  . carvedilol (COREG) 3.125 MG tablet TAKE 1 TABLET (3.125 MG TOTAL) BY MOUTH 2 (TWO) TIMES DAILY. 90 tablet 3  . Cholecalciferol (VITAMIN D) 2000 units CAPS Take 2,000 Units by mouth daily.     Marland Kitchen ENTRESTO 24-26 MG TAKE 1 TABLET BY MOUTH TWICE A DAY (Patient taking differently: Take 1 tablet by mouth 2 (two) times daily. ) 60 tablet 11  . ezetimibe (ZETIA) 10 MG tablet TAKE 1 TABLET BY MOUTH EVERY DAY (Patient taking differently: Take  10 mg by mouth daily. ) 30 tablet 11  . FLUoxetine (PROZAC) 40 MG capsule Take 40 mg by mouth daily.     . ivabradine (CORLANOR) 5 MG TABS tablet Take 0.5 tablets (2.5 mg total) by mouth 2 (two) times daily. 90 tablet 3  . levothyroxine (SYNTHROID, LEVOTHROID) 137 MCG tablet Take 137 mcg by mouth daily before breakfast.    . Multiple Vitamin (MULTIVITAMIN WITH MINERALS) TABS tablet Take 1 tablet by mouth daily.    . pantoprazole (PROTONIX) 40 MG tablet Take 40 mg by mouth daily.     Marland Kitchen REPATHA 140 MG/ML SOSY INJECT 140 MG INTO THE SKIN EVERY 14 (FOURTEEN) DAYS. 140 mL 6  . spironolactone (ALDACTONE) 25 MG tablet Take 0.5 tablets (12.5 mg total) by mouth daily. Please disregard duplicate prescription of 90 pills 30 tablet 3  . nitroGLYCERIN (NITROSTAT) 0.4 MG SL tablet Place 1 tablet (0.4 mg total) under the tongue every 5 (five) minutes as needed for chest pain. 30 tablet 2  . ondansetron (ZOFRAN ODT) 8 MG disintegrating tablet Take 1 tablet (8 mg total) by mouth every 8 (eight) hours as needed for nausea or vomiting. 20 tablet 0  . sildenafil (VIAGRA) 100 MG tablet TAKE 1 (ONE) TABLET AS NEEDED. DO NOT USE AT SAME TIME AS NITRO (Patient taking differently: Take 100 mg by mouth as needed for erectile dysfunction. TAKE 1 (ONE) TABLET AS NEEDED. DO NOT USE AT SAME TIME AS NITRO) 30 tablet 3  Cardiovascular studies:  Real-time Outpatient Cardiac Telemetry 04/01/2020 - 04/15/2020: Dominant rhythm: Sinus. HR 44-150 bpm. Avg HR 72 bpm. Supraventricular ectopy, PAC/upto 6 beat run. SVE burden <1% Ventricular ectopy, PVC, NSVT upto 5 beats. VE burden <1% No atrial fibrillation/atrial flutter/SVT/high grade AV block, sinus pause >3sec noted. 12 patient activated events do not correlate with arrhthymias.    EKG 02/20/2019: Sinus rhythm 64 bpm. Old anteroseptal infarct.   Stress perfusion MRI 11/07/2017: 1. Moderately dilated left ventricle with normal wall thickness and moderately decreased  systolic function (LVEF = 43%). There is hypokinesis of the basal anteroseptal, anterior walls and akinesis of the mid inferoseptal, anteroseptal, anterior, all apical walls including true apex. A large infarct in the basal and mid inferior, inferoseptal, anteroseptal, anterior walls and in all the apical walls with no ischemia. 2. Normal right ventricular size, thickness and systolic function (LVEF = 56%). There are no regional wall motion abnormalities. 3.  Mildly dilated left atrium. 4. Mild mitral and tricuspid regurgitation. Collectively, these findings are consistent with an ischemic cardiomyopathy with a large infarct in the LAD and RCA territory with no peri-infarct ischemia. There is poor chance of recovery if Revascularized.  Cath 08/21/2017: LM: Normal LAD: Patent ostial LAD stent. 20% ostial diagonal stenosis Ramus: 50% proximal stenosis RCA: Not engaged today. Known RCA CTO   Elevated LVEDP LVEF 45%   Continue aggressive medical therapy  Coronary angiogram 07/13/2017: Elevated LVEDP at 24 mmHg, systemic pressures 80/60 mmHg, patient in cardiogenic shock. Left main normal, LAD flush occluded. Ramus intermediate mid 70-80% % stenosis, mild disease in the circumflex. RCA occluded at the prior angioplasty site. Contralateral collaterals from the LAD.    Large LAD and diagonal, successful angioplasty and stenting following thrombectomy with implantation of 3.5 x 26 mm resolute Onyx DES deployed at 14 atmospheric pressure. Diagonal 1 ostial stenosis balloon angioplasty performed with 2.5 mm balloon at 10 atmospheric pressure. Stenosis reduced to 0%, mid flow improved from 0-3.  Carotid artery duplex 10/05/2017: No hemodynamically significant arterial disease in the internal carotid artery bilaterally. Normal study.  Antegrade right vertebral artery flow. Antegrade left vertebral artery flow.  Recent labs: 06/30/2020: Glucose 107, BUN/Cr 9/1.1. EGFR >60. Na/K 142/3.7.  H/H  12.6/37. MCV 92. Platelets 210 HbA1C 6.1% Chol 97, TG 96, HDL 32, LDL 46  Results for Bobby [name] (MRN [id number]) as of 07/12/2020 11:38  Ref. Range 06/29/2020 05:19 06/29/2020 11:08 06/29/2020 18:39 06/30/2020 00:00  B Natriuretic Peptide Latest Ref Range: 0.0 - 100.0 pg/mL 457.8 (H)     Troponin I (High Sensitivity) Latest Ref Range: <18 ng/L  28 (H) 29 (H) 25 (H)    03/16/2020: Glucose 121, BUN/Cr 15/1.0. EGFR 86. Na/K 141/4.2. Rest of the CMP normal H/H 15/45. MCV 92. Platelets 206 Chol 165, TG 325, HDL 39, LDL 74  06/2017: HbA1C 6.Marland Kitchen0% TSH 1.0 normal   Review of Systems  Constitutional: Positive for malaise/fatigue.  Cardiovascular: Negative for chest pain, dyspnea on exertion, leg swelling, palpitations and syncope.       Objective:    Vitals:   08/10/20 0935  BP: 114/89  Pulse: 62  Resp: 16  Temp: 98.3 F (36.8 C)  SpO2: 100%     Physical Exam Vitals and nursing note reviewed.  Constitutional:      Appearance: He is well-developed.  Neck:     Vascular: No JVD.  Cardiovascular:     Rate and Rhythm: Normal rate and regular rhythm.     Pulses: Intact distal pulses.  Heart sounds: Normal heart sounds. No murmur heard.   Pulmonary:     Effort: Pulmonary effort is normal.     Breath sounds: Normal breath sounds. No wheezing or rales.         Assessment & Recommendations:   49 year old Caucasian male with coronary artery disease status post anterior wall MI 06/2017 s/p successful LAD revascularization, prior RCA stent now with CTO, EF 43% on MRI in 10/2017 with no reversible ischemia.  Ischemic cardiomyopathy: Acute on chronic systolic heart failure.  While his physical exam is underwhelming, his elevated BNP, along with predominant GI symptoms possibly suggest intestinal edema. Started spironolactone 12.5 mg daily. Continue carvedilol 3.125 mg twice daily, Entresto 24-26 mg twice daily, ivabradine 2.5 mg twice daily. Check CMP, NT proBNP in 1  week.  CAD: It is possible that patient had an ACS event 1 week prior to his hospitalization.  Therefore, we were catching the tail end of his high-sensitivity troponin.  I will obtain exercise nuclear stress test.  Okay to continue bisoprolol given his established diagnosis of CAD. Continue aspirin 81, Lipitor 20, Repatha.  LDL down to 46, triglycerides down to 96. We will check lipoprotein a.    Irregular heart beat: No A. fib noted on cardiac telemetry.  Follow-up in 2 weeks.  Nigel Mormon, MD [facility] Pager: [phone] Office: [phone] If no answer Cell [phone]

## 2020-08-10 NOTE — Discharge Instructions (Signed)
Drink plenty of fluids for 48 hours and keep wrist elevated at heart level for 24 hours  Radial Site Care   This sheet gives you information about how to care for yourself after your procedure. Your health care provider may also give you more specific instructions. If you have problems or questions, contact your health care provider. What can I expect after the procedure? After the procedure, it is common to have:  Bruising and tenderness at the catheter insertion area. Follow these instructions at home: Medicines  Take over-the-counter and prescription medicines only as told by your health care provider. Insertion site care 1. Follow instructions from your health care provider about how to take care of your insertion site. Make sure you: ? Wash your hands with soap and water before you change your bandage (dressing). If soap and water are not available, use hand sanitizer. ? Remove your dressing as told by your health care provider. In 24 hours 2. Check your insertion site every day for signs of infection. Check for: ? Redness, swelling, or pain. ? Fluid or blood. ? Pus or a bad smell. ? Warmth. 3. Do not take baths, swim, or use a hot tub until your health care provider approves. 4. You may shower 24-48 hours after the procedure, or as directed by your health care provider. ? Remove the dressing and gently wash the site with plain soap and water. ? Pat the area dry with a clean towel. ? Do not rub the site. That could cause bleeding. 5. Do not apply powder or lotion to the site. Activity   1. For 24 hours after the procedure, or as directed by your health care provider: ? Do not flex or bend the affected arm. ? Do not push or pull heavy objects with the affected arm. ? Do not drive yourself home from the hospital or clinic. You may drive 24 hours after the procedure unless your health care provider tells you not to. ? Do not operate machinery or power tools. 2. Do not lift  anything that is heavier than 10 lb (4.5 kg), or the limit that you are told, until your health care provider says that it is safe.  For 4 days 3. Ask your health care provider when it is okay to: ? Return to work or school. ? Resume usual physical activities or sports. ? Resume sexual activity. General instructions  If the catheter site starts to bleed, raise your arm and put firm pressure on the site. If the bleeding does not stop, get help right away. This is a medical emergency.  If you went home on the same day as your procedure, a responsible adult should be with you for the first 24 hours after you arrive home.  Keep all follow-up visits as told by your health care provider. This is important. Contact a health care provider if:  You have a fever.  You have redness, swelling, or yellow drainage around your insertion site. Get help right away if:  You have unusual pain at the radial site.  The catheter insertion area swells very fast.  The insertion area is bleeding, and the bleeding does not stop when you hold steady pressure on the area.  Your arm or hand becomes pale, cool, tingly, or numb. These symptoms may represent a serious problem that is an emergency. Do not wait to see if the symptoms will go away. Get medical help right away. Call your local emergency services (911 in the [place] Do   not drive yourself to the hospital. Summary  After the procedure, it is common to have bruising and tenderness at the site.  Follow instructions from your health care provider about how to take care of your radial site wound. Check the wound every day for signs of infection.  Do not lift anything that is heavier than 10 lb (4.5 kg), or the limit that you are told, until your health care provider says that it is safe. This information is not intended to replace advice given to you by your health care provider. Make sure you discuss any questions you have with your health care  provider. Document Revised: 01/16/2018 Document Reviewed: 01/16/2018 Elsevier Patient Education  2020 [employer]  

## 2020-08-11 ENCOUNTER — Encounter (HOSPITAL_COMMUNITY): Payer: Self-pay | Admitting: Cardiology

## 2020-08-12 ENCOUNTER — Ambulatory Visit: Payer: Self-pay | Admitting: Neurology

## 2020-08-13 NOTE — Telephone Encounter (Signed)
error 

## 2020-08-19 ENCOUNTER — Encounter: Payer: Self-pay | Admitting: Cardiology

## 2020-08-19 ENCOUNTER — Ambulatory Visit: Payer: BC Managed Care – PPO | Admitting: Cardiology

## 2020-08-19 ENCOUNTER — Other Ambulatory Visit: Payer: Self-pay

## 2020-08-19 VITALS — BP 109/71 | HR 73 | Resp 17 | Ht 73.0 in | Wt 214.0 lb

## 2020-08-19 DIAGNOSIS — I255 Ischemic cardiomyopathy: Secondary | ICD-10-CM

## 2020-08-19 DIAGNOSIS — I5023 Acute on chronic systolic (congestive) heart failure: Secondary | ICD-10-CM

## 2020-08-19 DIAGNOSIS — I25118 Atherosclerotic heart disease of native coronary artery with other forms of angina pectoris: Secondary | ICD-10-CM | POA: Diagnosis not present

## 2020-08-19 DIAGNOSIS — I5020 Unspecified systolic (congestive) heart failure: Secondary | ICD-10-CM

## 2020-08-19 DIAGNOSIS — I2510 Atherosclerotic heart disease of native coronary artery without angina pectoris: Secondary | ICD-10-CM

## 2020-08-19 MED ORDER — REPATHA 140 MG/ML ~~LOC~~ SOSY: 140.0000 mg | PREFILLED_SYRINGE | SUBCUTANEOUS | 11 refills | Status: DC

## 2020-08-19 MED ORDER — SPIRONOLACTONE 25 MG PO TABS: 12.5000 mg | ORAL_TABLET | Freq: Every day | ORAL | 3 refills | Status: DC

## 2020-08-19 NOTE — Progress Notes (Signed)
Patient is here for follow up visit.  Subjective:   Bobby [name], male    DOB: 12-27-1970, 49 y.o.   MRN: [id number]   Chief Complaint  Patient presents with  . Coronary Artery Disease  . Follow-up  . Results    HPI   49 year old Caucasian male with coronary artery disease status post anterior wall MI 06/2017 s/p successful LAD revascularization, prior RCA stent now with CTO, EF 43% on MRI in 10/2017 with no reversible ischemia.  Recent cardiac catheterization showed no new severe abnormalities, details below. He is feeling better, denies any chest pain, shortness of breath symptoms.    Current Outpatient Medications on File Prior to Visit  Medication Sig Dispense Refill  . aspirin 81 MG chewable tablet TAKE 1 TABLET BY MOUTH EVERY DAY (Patient taking differently: Chew 81 mg by mouth daily. ) 30 tablet 11  . atorvastatin (LIPITOR) 20 MG tablet TAKE 1 TABLET BY MOUTH EVERY DAY (Patient taking differently: Take 20 mg by mouth daily. ) 90 tablet 0  . carvedilol (COREG) 3.125 MG tablet TAKE 1 TABLET (3.125 MG TOTAL) BY MOUTH 2 (TWO) TIMES DAILY. 90 tablet 3  . Cholecalciferol (VITAMIN D) 2000 units CAPS Take 2,000 Units by mouth daily.     Marland Kitchen ENTRESTO 24-26 MG TAKE 1 TABLET BY MOUTH TWICE A DAY (Patient taking differently: Take 1 tablet by mouth 2 (two) times daily. ) 60 tablet 11  . ezetimibe (ZETIA) 10 MG tablet TAKE 1 TABLET BY MOUTH EVERY DAY (Patient taking differently: Take 10 mg by mouth daily. ) 30 tablet 11  . FLUoxetine (PROZAC) 40 MG capsule Take 40 mg by mouth daily.     . ivabradine (CORLANOR) 5 MG TABS tablet Take 0.5 tablets (2.5 mg total) by mouth 2 (two) times daily. 90 tablet 3  . levothyroxine (SYNTHROID, LEVOTHROID) 137 MCG tablet Take 137 mcg by mouth daily before breakfast.    . Multiple Vitamin (MULTIVITAMIN WITH MINERALS) TABS tablet Take 1 tablet by mouth daily.    . nitroGLYCERIN (NITROSTAT) 0.4 MG SL tablet Place 1 tablet (0.4 mg total) under the tongue  every 5 (five) minutes as needed for chest pain. 30 tablet 2  . ondansetron (ZOFRAN ODT) 8 MG disintegrating tablet Take 1 tablet (8 mg total) by mouth every 8 (eight) hours as needed for nausea or vomiting. 20 tablet 0  . pantoprazole (PROTONIX) 40 MG tablet Take 40 mg by mouth daily.     Marland Kitchen REPATHA 140 MG/ML SOSY INJECT 140 MG INTO THE SKIN EVERY 14 (FOURTEEN) DAYS. 140 mL 6  . sildenafil (VIAGRA) 100 MG tablet TAKE 1 (ONE) TABLET AS NEEDED. DO NOT USE AT SAME TIME AS NITRO (Patient taking differently: Take 100 mg by mouth as needed for erectile dysfunction. TAKE 1 (ONE) TABLET AS NEEDED. DO NOT USE AT SAME TIME AS NITRO) 30 tablet 3  . spironolactone (ALDACTONE) 25 MG tablet Take 0.5 tablets (12.5 mg total) by mouth daily. Please disregard duplicate prescription of 90 pills 30 tablet 3   No current facility-administered medications on file prior to visit.    Cardiovascular studies:  Coronary angiography 08/10/2020: LM: Normal LAD: Patent ostial LAD stent. 20% ostial diagonal stenosis LCx: 20% OM1 stenosis RCA: Prox in-stent CTO. Left-to-left collaterals fill up to mid RCA Normal LVEDP    Real-time Outpatient Cardiac Telemetry 04/01/2020 - 04/15/2020: Dominant rhythm: Sinus. HR 44-150 bpm. Avg HR 72 bpm. Supraventricular ectopy, PAC/upto 6 beat run. SVE burden <1% Ventricular ectopy, PVC, NSVT  upto 5 beats. VE burden <1% No atrial fibrillation/atrial flutter/SVT/high grade AV block, sinus pause >3sec noted. 12 patient activated events do not correlate with arrhthymias.    EKG 02/20/2019: Sinus rhythm 64 bpm. Old anteroseptal infarct.   Stress perfusion MRI 11/07/2017: 1. Moderately dilated left ventricle with normal wall thickness and moderately decreased systolic function (LVEF = 43%). There is hypokinesis of the basal anteroseptal, anterior walls and akinesis of the mid inferoseptal, anteroseptal, anterior, all apical walls including true apex. A large infarct in the basal  and mid inferior, inferoseptal, anteroseptal, anterior walls and in all the apical walls with no ischemia. 2. Normal right ventricular size, thickness and systolic function (LVEF = 56%). There are no regional wall motion abnormalities. 3.  Mildly dilated left atrium. 4. Mild mitral and tricuspid regurgitation. Collectively, these findings are consistent with an ischemic cardiomyopathy with a large infarct in the LAD and RCA territory with no peri-infarct ischemia. There is poor chance of recovery if Revascularized.  Cath 08/21/2017: LM: Normal LAD: Patent ostial LAD stent. 20% ostial diagonal stenosis Ramus: 50% proximal stenosis RCA: Not engaged today. Known RCA CTO   Elevated LVEDP LVEF 45%   Continue aggressive medical therapy  Coronary angiogram 07/13/2017: Elevated LVEDP at 24 mmHg, systemic pressures 80/60 mmHg, patient in cardiogenic shock. Left main normal, LAD flush occluded. Ramus intermediate mid 70-80% % stenosis, mild disease in the circumflex. RCA occluded at the prior angioplasty site. Contralateral collaterals from the LAD.    Large LAD and diagonal, successful angioplasty and stenting following thrombectomy with implantation of 3.5 x 26 mm resolute Onyx DES deployed at 14 atmospheric pressure. Diagonal 1 ostial stenosis balloon angioplasty performed with 2.5 mm balloon at 10 atmospheric pressure. Stenosis reduced to 0%, mid flow improved from 0-3.  Carotid artery duplex 10/05/2017: No hemodynamically significant arterial disease in the internal carotid artery bilaterally. Normal study.  Antegrade right vertebral artery flow. Antegrade left vertebral artery flow.  Recent labs: 06/30/2020: Glucose 107, BUN/Cr 9/1.1. EGFR >60. Na/K 142/3.7.  H/H 12.6/37. MCV 92. Platelets 210 HbA1C 6.1% Chol 97, TG 96, HDL 32, LDL 46  Results for JAMIL, ARMWOOD (MRN [id number]) as of 07/12/2020 11:38  Ref. Range 06/29/2020 05:19 06/29/2020 11:08 06/29/2020 18:39 06/30/2020 00:00  B Natriuretic  Peptide Latest Ref Range: 0.0 - 100.0 pg/mL 457.8 (H)     Troponin I (High Sensitivity) Latest Ref Range: <18 ng/L  28 (H) 29 (H) 25 (H)    03/16/2020: Glucose 121, BUN/Cr 15/1.0. EGFR 86. Na/K 141/4.2. Rest of the CMP normal H/H 15/45. MCV 92. Platelets 206 Chol 165, TG 325, HDL 39, LDL 74  06/2017: HbA1C 6.Marland Kitchen0% TSH 1.0 normal   Review of Systems  Constitutional: Negative for malaise/fatigue.  Cardiovascular: Negative for chest pain, dyspnea on exertion, leg swelling, palpitations and syncope.       Objective:    Vitals:   08/19/20 0925  BP: 109/71  Pulse: 73  Resp: 17  SpO2: 99%     Physical Exam Vitals and nursing note reviewed.  Constitutional:      Appearance: He is well-developed.  Neck:     Vascular: No JVD.  Cardiovascular:     Rate and Rhythm: Normal rate and regular rhythm.     Pulses: Intact distal pulses.     Heart sounds: Normal heart sounds. No murmur heard.   Pulmonary:     Effort: Pulmonary effort is normal.     Breath sounds: Normal breath sounds. No wheezing or rales.  Assessment & Recommendations:   49 year old Caucasian male with coronary artery disease status post anterior wall MI 06/2017 s/p successful LAD revascularization, prior RCA stent now with CTO, EF 43% on MRI in 10/2017 with no reversible ischemia.  Ischemic cardiomyopathy: Chronic, stable. Acute decompensation has resolved. Resumed spironolactone 12.5 mg daily. Check BMP in 1 week Continue carvedilol 3.125 mg twice daily, Entresto 24-26 mg twice daily, ivabradine 2.5 mg twice daily.  CAD: Unchanged coronary anatomy. Known RCA CTO with no viability in RCA territory on MRI. Continue aspirin 81, Lipitor 20, bisoprololRepatha.  LDL down to 46, triglycerides down to 96. Lipoprotein a normal.   F/u in 6 months  Rachit Grim Esther Hardy, MD [facility] Pager: [phone] Office: [phone] If no answer Cell [phone]

## 2020-09-08 DIAGNOSIS — G4733 Obstructive sleep apnea (adult) (pediatric): Secondary | ICD-10-CM | POA: Diagnosis not present

## 2020-09-08 NOTE — Telephone Encounter (Signed)
Error

## 2020-09-27 ENCOUNTER — Telehealth: Payer: Self-pay | Admitting: Neurology

## 2020-09-27 NOTE — Telephone Encounter (Signed)
Pt's wife called wanting to make sure that the pt is ok for his initial cpap appt since he had to r/s past the 90 day insurance compliance time. Pt has been in and out of the hospital and was needing to push his date out. Please advise.

## 2020-09-27 NOTE — Telephone Encounter (Signed)
I returned the call to the patient's wife. He picked up his CPAP machine on September 2nd or 3rd. He has a pending appt on 10/07/20. He will be well within his 30-90 day time frame for follow up.

## 2020-09-29 ENCOUNTER — Ambulatory Visit: Payer: Self-pay | Admitting: Neurology

## 2020-10-07 ENCOUNTER — Encounter: Payer: Self-pay | Admitting: Neurology

## 2020-10-07 ENCOUNTER — Ambulatory Visit (INDEPENDENT_AMBULATORY_CARE_PROVIDER_SITE_OTHER): Payer: BC Managed Care – PPO | Admitting: Neurology

## 2020-10-07 VITALS — BP 106/70 | HR 61 | Ht 73.0 in | Wt 218.0 lb

## 2020-10-07 DIAGNOSIS — Z9989 Dependence on other enabling machines and devices: Secondary | ICD-10-CM

## 2020-10-07 DIAGNOSIS — G4733 Obstructive sleep apnea (adult) (pediatric): Secondary | ICD-10-CM | POA: Diagnosis not present

## 2020-10-07 NOTE — Progress Notes (Signed)
Subjective:    Patient ID: Bobby [name] is a 49 y.o. male.  HPI     Interim history:   Bobby [name] is a 48 year old right-handed gentleman with an underlying medical history of allergic rhinitis, hypertension, hyperlipidemia, thyroid disease, coronary artery disease, status post MI, status post stent placement, Ischemic cardiomyopathy, chronic systolic congestive heart failure, suspected A. fib, and mild obesity, who presents for follow-up consultation of his obstructive sleep apnea after interim sleep testing and starting AutoPap therapy.  He was referred by his cardiologist and I saw him on 04/01/2020 for concern for sleep apnea.  He reported witnessed apneas and daytime somnolence with an Epworth sleepiness score of 16 out of 24 at the time.  He was advised to proceed with sleep testing.  He had a home sleep test on 06/07/2020 which showed moderate obstructive sleep apnea with an AHI of 26/h, O2 nadir of 83%.  He was advised to start AutoPap therapy.  Set up date was 07/08/2020.  Today, 10/07/2020: I reviewed his AutoPap compliance data from 09/06/2020 through 10/05/2020, which is a total of 30 days, during which time he used his AutoPap every night with percent used days greater than 4 hours at 93%, indicating excellent compliance with an average usage of 6 hours and 22 minutes, residual AHI at goal but on the higher end at 4.1/h, 95th percentile of pressure at 9.1 cm with a range of 6-13 with EPR, leak acceptable with a 95th percentile at 16.9 L/min.  He reports doing better.  He had issues adjusting to AutoPap therapy in the first month and a half or even 2 months but eventually has been able to use it consistently.  He uses a nasal mask.  Sometimes he pulls off the mask in the middle of the night and does not realize it.  Overall, he feels that he is better rested, has better sleep consolidation, and less daytime somnolence.  He is highly motivated to continue with treatment.  He is quite pleased with  how he is doing thus far.  He is hopeful that sleep apnea treatment will also positively impact his heart disease.  He has changed his filter.  He is mindful of using distilled water in the machine but sometimes has difficulty finding distilled water.  He is mindful of cleanliness with the machine. Of note, he was hospitalized in July for today for nausea, vomiting and diarrhea.  Upper GI endoscopy revealed mucosal erythema and swelling.  He was treated aggressively for reflux disease.  He recently saw his cardiologist.  He was suspected to have had a recent acute coronary event.   Previously:   04/01/20: (He) reports significant daytime somnolence and witnessed apneas per wife's report. He had a heart attack in 2018.  He sometimes wakes up with a sense of gasping for air.  He has nocturia about twice or once per average night, denies any recurrent morning headaches but has a tendency to move his legs or twitch while asleep as noted by himself and his wife.  His father has sleep apnea and uses a CPAP machine.  Patient would be willing to get retested and consider CPAP therapy if the need arises.  He has a bedtime of around 10 and rise time between 5 and 6.  He lives with his wife, he has grown children, 1 dog in the household, dog does not sleep on the bed with them.  They do not watch TV in the bedroom.  He drinks caffeine in the  form of half caffeinated coffee, 1 large tumbler per day, otherwise water.  He quit smoking in 2018.  He drinks alcohol occasionally, on the weekends.  I reviewed your office note from 03/18/2020.  His Epworth sleepiness score is 16/24.  I had evaluated him several years ago for suspected sleep apnea, he had mild sleep apnea at the time, he had a sleep study on 11/03/2014 which showed a total AHI of 10.8/h, supine AHI and REM AHI in the moderate range at 17.8 and 18.5, respectively, O2 nadir of 80%.  He had been advised to return for a CPAP titration study.  He did not return for  follow-up and was lost to follow-up since then.    The patient's allergies, current medications, family history, past medical history, past social history, past surgical history and problem list were reviewed and updated as appropriate.   Previously:   09/28/14: 49 year old right-handed gentleman with an underlying medical history of hypothyroidism with Hx of Graves d/s, hyperlipidemia, allergic rhinitis, hypertension, reflux disease, obesity and CAD, s/p MI and 2 stents, who reports non-restorative sleep, and daytime somnolence. He reports difficulty with concentration and he has very restless sleep, tossing and turning, and sleep talking. He grinds his teeth. He has a FHx early cardiac. He has occasional nocturia. He has a bedtime of 9-10 PM, and while he goes to sleep quickly, he wakes multiple times at night. He has had night sweats. He has had hotflashes and feels shaky.  He drinks 2 sodas per day with caffeine. He drinks alcohol rarely. He quit smoking. He denies morning headaches.  He reports excessive daytime somnolence (EDS) and His Epworth Sleepiness Score (ESS) is 14/24 today. He has not fallen asleep while driving. The patient has not been taking a scheduled nap, but sometimes takes a nap, which may make him feel worse. He denies a sense of choking or strangling feeling. There is report of nighttime reflux, with rare nighttime cough experienced. The patient has not noted any RLS symptoms and is known to kick while asleep or before falling asleep. There is family history of RLS or OSA.  He is a restless sleeper and in the morning, the bed is quite disheveled.    He denies cataplexy, sleep paralysis, hypnagogic or hypnopompic hallucinations, or sleep attacks. He does report dream enactments, or parasomnias, such as sleep talking or sleep walking. The patient has not had a sleep study or a home sleep test. He is not sure if he dreams.  His bedroom is usually dark and cool. There is a TV in the  bedroom and usually it is not on at night.  His Past Medical History Is Significant For: Past Medical History:  Diagnosis Date  . Acute MI anterior wall first episode care [facility]) 07/13/2017   Occluded ostial LAD S/P 3.5x26 Onyx DES. RI 80%. RCA Occluded at prior stent site with collaterals   . Cardiogenic shock ([place]) 07/13/2017  . CHF (congestive heart failure) ([place])   . Graves disease   . Hypertension   . MI (myocardial infarction) ([place])   . Thyroid disease    thyroid irradiated per pt    His Past Surgical History Is Significant For: Past Surgical History:  Procedure Laterality Date  . CORONARY ANGIOPLASTY WITH STENT PLACEMENT    . CORONARY/GRAFT ACUTE MI REVASCULARIZATION N/A 07/13/2017   Procedure: Coronary/Graft Acute MI Revascularization;  Surgeon: Adrian Prows, MD;  Location: [place] [facility];  Service: Cardiovascular;  Laterality: N/A;  . ESOPHAGOGASTRODUODENOSCOPY N/A 06/30/2020  Procedure: ESOPHAGOGASTRODUODENOSCOPY (EGD);  Surgeon: Toledo, Benay Pike, MD;  Location: [facility] ENDOSCOPY;  Service: Gastroenterology;  Laterality: N/A;  . LEFT HEART CATH AND CORONARY ANGIOGRAPHY N/A 07/13/2017   Procedure: Left Heart Cath and Coronary Angiography;  Surgeon: Adrian Prows, MD;  Location: [place] [facility];  Service: Cardiovascular;  Laterality: N/A;  . LEFT HEART CATH AND CORONARY ANGIOGRAPHY N/A 08/21/2017   Procedure: LEFT HEART CATH AND CORONARY ANGIOGRAPHY;  Surgeon: Nigel Mormon, MD;  Location: [place] [facility];  Service: Cardiovascular;  Laterality: N/A;  . LEFT HEART CATH AND CORONARY ANGIOGRAPHY N/A 08/10/2020   Procedure: LEFT HEART CATH AND CORONARY ANGIOGRAPHY;  Surgeon: Nigel Mormon, MD;  Location: [place] [facility];  Service: Cardiovascular;  Laterality: N/A;  . SKIN GRAFT    . VENTRICULAR ASSIST DEVICE INSERTION N/A 07/13/2017   Procedure: Ventricular Assist Device Insertion;  Surgeon: Adrian Prows, MD;  Location: [place] [facility];  Service: Cardiovascular;   Laterality: N/A;    His Family History Is Significant For: Family History  Problem Relation Age of Onset  . Heart disease Father   . Hypertension Father   . Heart disease Paternal Grandmother     His Social History Is Significant For: Social History   Socioeconomic History  . Marital status: Married    Spouse name: Not on file  . Number of children: 2  . Years of education: Not on file  . Highest education level: Not on file  Occupational History  . Not on file  Tobacco Use  . Smoking status: Former Smoker    Packs/day: 0.50    Years: 20.00    Pack years: 10.00    Types: Cigarettes    Quit date: 07/13/2017    Years since quitting: 3.2  . Smokeless tobacco: Never Used  . Tobacco comment: quit 2012  Vaping Use  . Vaping Use: Never used  Substance and Sexual Activity  . Alcohol use: Yes    Comment: occ  . Drug use: No  . Sexual activity: Yes  Other Topics Concern  . Not on file  Social History Narrative   Lives with fiance, working FT , disesel equip, right handed, 2 kids, college edu   Social Determinants of Health   Financial Resource Strain:   . Difficulty of Paying Living Expenses: Not on file  Food Insecurity:   . Worried About Charity fundraiser in the Last Year: Not on file  . Ran Out of Food in the Last Year: Not on file  Transportation Needs:   . Lack of Transportation (Medical): Not on file  . Lack of Transportation (Non-Medical): Not on file  Physical Activity:   . Days of Exercise per Week: Not on file  . Minutes of Exercise per Session: Not on file  Stress:   . Feeling of Stress : Not on file  Social Connections:   . Frequency of Communication with Friends and Family: Not on file  . Frequency of Social Gatherings with Friends and Family: Not on file  . Attends Religious Services: Not on file  . Active Member of Clubs or Organizations: Not on file  . Attends Archivist Meetings: Not on file  . Marital Status: Not on file    His  Allergies Are:  No Known Allergies:   His Current Medications Are:  Outpatient Encounter Medications as of 10/07/2020  Medication Sig  . aspirin 81 MG chewable tablet TAKE 1 TABLET BY MOUTH EVERY DAY (Patient taking differently:  Chew 81 mg by mouth daily. )  . atorvastatin (LIPITOR) 20 MG tablet TAKE 1 TABLET BY MOUTH EVERY DAY (Patient taking differently: Take 20 mg by mouth daily. )  . carvedilol (COREG) 3.125 MG tablet TAKE 1 TABLET (3.125 MG TOTAL) BY MOUTH 2 (TWO) TIMES DAILY.  Marland Kitchen Cholecalciferol (VITAMIN D) 2000 units CAPS Take 2,000 Units by mouth daily.   Marland Kitchen ENTRESTO 24-26 MG TAKE 1 TABLET BY MOUTH TWICE A DAY (Patient taking differently: Take 1 tablet by mouth 2 (two) times daily. )  . Evolocumab (REPATHA) 140 MG/ML SOSY Inject 140 mg into the skin every 14 (fourteen) days.  Marland Kitchen ezetimibe (ZETIA) 10 MG tablet TAKE 1 TABLET BY MOUTH EVERY DAY (Patient taking differently: Take 10 mg by mouth daily. )  . FLUoxetine (PROZAC) 40 MG capsule Take 40 mg by mouth daily.   . ivabradine (CORLANOR) 5 MG TABS tablet Take 0.5 tablets (2.5 mg total) by mouth 2 (two) times daily.  Marland Kitchen levothyroxine (SYNTHROID, LEVOTHROID) 137 MCG tablet Take 137 mcg by mouth daily before breakfast.  . Multiple Vitamin (MULTIVITAMIN WITH MINERALS) TABS tablet Take 1 tablet by mouth daily.  . nitroGLYCERIN (NITROSTAT) 0.4 MG SL tablet Place 1 tablet (0.4 mg total) under the tongue every 5 (five) minutes as needed for chest pain.  . pantoprazole (PROTONIX) 40 MG tablet Take 40 mg by mouth daily.   . sildenafil (VIAGRA) 100 MG tablet TAKE 1 (ONE) TABLET AS NEEDED. DO NOT USE AT SAME TIME AS NITRO (Patient taking differently: Take 100 mg by mouth as needed for erectile dysfunction. TAKE 1 (ONE) TABLET AS NEEDED. DO NOT USE AT SAME TIME AS NITRO)  . spironolactone (ALDACTONE) 25 MG tablet Take 0.5 tablets (12.5 mg total) by mouth daily. Please disregard duplicate prescription of 90 pills  . [DISCONTINUED] ondansetron (ZOFRAN ODT)  8 MG disintegrating tablet Take 1 tablet (8 mg total) by mouth every 8 (eight) hours as needed for nausea or vomiting.   No facility-administered encounter medications on file as of 10/07/2020.  :  Review of Systems:  Out of a complete 14 point review of systems, all are reviewed and negative with the exception of these symptoms as listed below: Review of Systems  Neurological:       Here to f/u on cpap. Reports he had trouble adjusting in the beginning to his machine but reports he has been doing well as of recent.   Reports he has been feeling better and resting well.     Objective:  Neurological Exam  Physical Exam Physical Examination:   Vitals:   10/07/20 0936  BP: 106/70  Pulse: 61  SpO2: 96%    General Examination: The patient is a very pleasant 49 y.o. male in no acute distress. He appears well-developed and well-nourished and well groomed.   HEENT: Normocephalic, atraumatic, pupils are equal, round and reactive to light, extraocular tracking is good without limitation to gaze excursion or nystagmus noted. Hearing is grossly intact. Face is symmetric with normal facial animation. Speech is clear with no dysarthria noted. There is no hypophonia. There is no lip, neck/head, jaw or voice tremor. Neck is supple with full range of passive and active motion. There are no carotid bruits on auscultation. Oropharynx exam reveals: moderate mouth dryness, adequate dental hygiene and mild airway crowding. Tongue protrudes centrally and palate elevates symmetrically. Tonsils are absent.  Chest: Clear to auscultation without wheezing, rhonchi or crackles noted.  Heart: S1+S2+0, regular and normal without murmurs, rubs or gallops  noted.   Abdomen: Soft, non-tender and non-distended.  Extremities: There is obvious change.   Skin: Warm and dry without trophic changes noted.   Musculoskeletal: exam reveals no obvious joint deformities, tenderness or joint swelling or erythema.    Neurologically:  Mental status: The patient is awake, alert and oriented in all 4 spheres. His immediate and remote memory, attention, language skills and fund of knowledge are appropriate. There is no evidence of aphasia, agnosia, apraxia or anomia. Speech is clear with normal prosody and enunciation. Thought process is linear. Mood is normal and affect is normal.  Cranial nerves II - XII are as described above under HEENT exam.  Motor exam: Normal bulk, strength and tone is noted. There is no tremor, Romberg is negative. Fine motor skills and coordination: grossly intact.  Cerebellar testing: No dysmetria or intention tremor. There is no truncal or gait ataxia.  Sensory exam: intact to light touch in the upper and lower extremities.  Gait, station and balance: He stands easily. No veering to one side is noted. No leaning to one side is noted. Posture is age-appropriate and stance is narrow based. Gait shows normal stride length and normal pace. No problems turning are noted. Tandem walk is unremarkable.                Assessment and plan:   In summary, Bobby [name] is a very pleasant 49 year old male with an underlying medical history of allergic rhinitis, hypertension, hyperlipidemia, thyroid disease, coronary artery disease, status post MI, status post stent placement, suspected A. fib, and mild obesity, who presents for follow-up consultation of his obstructive sleep apnea problem which was determined to be in the moderate range by home sleep testing on 06/07/2020.  He has established treatment with AutoPap at home since mid July and is currently fully compliant with treatment.  He had just a couple of nights where he may have pulled off the mask and not realize it.  He feels improvement in his daytime energy and daytime somnolence as well as sleep quality.  He does report still tossing and turning a lot but overall feels better.  He is commended for his treatment adherence and I am pleased to  hear that he has noticed improvement.  He is motivated to continue with AutoPap therapy long-term.  He is advised to follow-up routinely to see one of our nurse practitioners in 6 months and after that, hopefully, if he continues to do well and is stable he can be seen on a yearly basis.  He is mindful of changing the mask and filters on a regular basis and being clinically with the equipment, using distilled water in the humidifier.  I answered all his questions today and he was in agreement with the plan.   I spent 30 minutes in total face-to-face time and in reviewing records during pre-charting, more than 50% of which was spent in counseling and coordination of care, reviewing test results, reviewing medications and treatment regimen and/or in discussing or reviewing the diagnosis of OSA, the prognosis and treatment options. Pertinent laboratory and imaging test results that were available during this visit with the patient were reviewed by me and considered in my medical decision making (see chart for details).

## 2020-10-07 NOTE — Patient Instructions (Signed)
It was good to see you again today.  I am glad to hear that you have adjusted well to AutoPap therapy.  You are currently fully compliant with it and are highly commended for it.  Please keep up the good work. Please continue using your autoPAP regularly. While your insurance requires that you use PAP at least 4 hours each night on 70% of the nights, I recommend, that you not skip any nights and use it throughout the night if you can. Getting used to PAP and staying with the treatment long term does take time and patience and discipline. Untreated obstructive sleep apnea when it is moderate to severe can have an adverse impact on cardiovascular health and raise her risk for heart disease, arrhythmias, hypertension, congestive heart failure, stroke and diabetes. Untreated obstructive sleep apnea causes sleep disruption, nonrestorative sleep, and sleep deprivation. This can have an impact on your day to day functioning and cause daytime sleepiness and impairment of cognitive function, memory loss, mood disturbance, and problems focussing. Using PAP regularly can improve these symptoms.  I am also glad to hear that you are feeling better.  I think AutoPap therapy will help you in the long run from the cardiovascular standpoint as well.  Please follow-up with one of our nurse practitioners in 6 months and after that, hopefully if you continue to do well and feels stable we can see you once a year.

## 2020-10-08 ENCOUNTER — Ambulatory Visit: Payer: BC Managed Care – PPO

## 2020-10-08 ENCOUNTER — Other Ambulatory Visit: Payer: Self-pay

## 2020-10-08 ENCOUNTER — Encounter: Payer: Self-pay | Admitting: Podiatry

## 2020-10-08 ENCOUNTER — Ambulatory Visit (INDEPENDENT_AMBULATORY_CARE_PROVIDER_SITE_OTHER): Payer: BC Managed Care – PPO | Admitting: Podiatry

## 2020-10-08 DIAGNOSIS — M6740 Ganglion, unspecified site: Secondary | ICD-10-CM

## 2020-10-08 DIAGNOSIS — M79673 Pain in unspecified foot: Secondary | ICD-10-CM | POA: Diagnosis not present

## 2020-10-08 DIAGNOSIS — G4733 Obstructive sleep apnea (adult) (pediatric): Secondary | ICD-10-CM | POA: Diagnosis not present

## 2020-10-08 NOTE — Progress Notes (Signed)
HPI: 49 year old male presenting today for follow-up evaluation of the mucoid cyst that has been present on the patient's foot for approximately 5 years now.  He was last seen in 2019 at which time we decided to perform a DIPJ arthroplasty of the left 2nd toe.  Unfortunately he was unable to have surgery but the cyst continues to irritate him.  He gets shooting bee sting sensations to the left 2nd toe.  Past Medical History:  Diagnosis Date  . Acute MI anterior wall first episode care [facility]) 07/13/2017   Occluded ostial LAD S/P 3.5x26 Onyx DES. RI 80%. RCA Occluded at prior stent site with collaterals   . Cardiogenic shock ([place]) 07/13/2017  . CHF (congestive heart failure) ([place])   . Graves disease   . Hypertension   . MI (myocardial infarction) ([place])   . Thyroid disease    thyroid irradiated per pt     Physical Exam: General: The patient is alert and oriented x3 in no acute distress.  Dermatology: Cyst noted to the DIPJ of the left 2nd toe. Skin is warm, dry and supple bilateral lower extremities. Negative for open lesions or macerations.  Vascular: Palpable pedal pulses bilaterally. No edema or erythema noted. Capillary refill within normal limits.  Neurological: Epicritic and protective threshold grossly intact bilaterally.   Musculoskeletal Exam: Range of motion within normal limits to all pedal and ankle joints bilateral. Muscle strength 5/5 in all groups bilateral.    Assessment: 1. Mucoid cyst DIPJ 2nd left  Plan of Care:  1. Patient evaluated.   2. Today we discussed the conservative versus surgical management of the presenting pathology. The patient opts for surgical management. All possible complications and details of the procedure were explained. All patient questions were answered. No guarantees were expressed or implied. 3. Authorization for surgery was initiated today.  Excision of mucoid cyst 2nd digit left.  Surgery will consist of DIPJ arthroplasty 2nd left. 4.  Return to clinic one week post op.   *Deductible met.  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 Edrick Kins, DPM    [address], [place] [address]                Office [phone]  Fax [phone]

## 2020-10-13 ENCOUNTER — Other Ambulatory Visit: Payer: Self-pay

## 2020-10-13 DIAGNOSIS — I255 Ischemic cardiomyopathy: Secondary | ICD-10-CM

## 2020-10-13 DIAGNOSIS — I2510 Atherosclerotic heart disease of native coronary artery without angina pectoris: Secondary | ICD-10-CM

## 2020-10-13 DIAGNOSIS — I5020 Unspecified systolic (congestive) heart failure: Secondary | ICD-10-CM

## 2020-10-28 ENCOUNTER — Other Ambulatory Visit: Payer: Self-pay | Admitting: Cardiology

## 2020-10-28 DIAGNOSIS — I2510 Atherosclerotic heart disease of native coronary artery without angina pectoris: Secondary | ICD-10-CM

## 2020-11-16 ENCOUNTER — Encounter: Payer: BC Managed Care – PPO | Admitting: Podiatry

## 2020-11-16 DIAGNOSIS — Z23 Encounter for immunization: Secondary | ICD-10-CM | POA: Diagnosis not present

## 2020-11-16 DIAGNOSIS — F329 Major depressive disorder, single episode, unspecified: Secondary | ICD-10-CM | POA: Diagnosis not present

## 2020-11-16 DIAGNOSIS — E039 Hypothyroidism, unspecified: Secondary | ICD-10-CM | POA: Diagnosis not present

## 2020-11-16 DIAGNOSIS — E785 Hyperlipidemia, unspecified: Secondary | ICD-10-CM | POA: Diagnosis not present

## 2020-11-16 DIAGNOSIS — R739 Hyperglycemia, unspecified: Secondary | ICD-10-CM | POA: Diagnosis not present

## 2020-11-22 ENCOUNTER — Ambulatory Visit: Payer: 59 | Admitting: Cardiology

## 2020-11-22 ENCOUNTER — Encounter: Payer: Self-pay | Admitting: Cardiology

## 2020-11-22 ENCOUNTER — Other Ambulatory Visit: Payer: Self-pay

## 2020-11-22 VITALS — BP 103/63 | HR 68 | Resp 16 | Ht 73.0 in | Wt 231.0 lb

## 2020-11-22 DIAGNOSIS — I255 Ischemic cardiomyopathy: Secondary | ICD-10-CM

## 2020-11-22 DIAGNOSIS — I5020 Unspecified systolic (congestive) heart failure: Secondary | ICD-10-CM | POA: Insufficient documentation

## 2020-11-22 DIAGNOSIS — I2510 Atherosclerotic heart disease of native coronary artery without angina pectoris: Secondary | ICD-10-CM | POA: Diagnosis not present

## 2020-11-22 MED ORDER — SPIRONOLACTONE 25 MG PO TABS: 25.0000 mg | ORAL_TABLET | Freq: Every day | ORAL | 2 refills | Status: DC

## 2020-11-22 NOTE — Progress Notes (Signed)
Patient is here for follow up visit.  Subjective:   Bobby [name], male    DOB: 1971-04-30, 49 y.o.   MRN: [id number]   Chief Complaint  Patient presents with  . Coronary Artery Disease  . Follow-up    3 month    HPI   49 year old Caucasian male with coronary artery disease status post anterior wall MI 06/2017 s/p successful LAD revascularization, prior RCA stent now with CTO, EF 43% on MRI in 10/2017 with no reversible ischemia.  Patient has recently had increased abdominal bloating sensation and 15 lb wt gain in 2 weeks. He denies chest pain, shortness of breath, palpitations, leg edema, orthopnea, PND, TIA/syncope.  Reviewed recent labs, details below.    Current Outpatient Medications on File Prior to Visit  Medication Sig Dispense Refill  . aspirin 81 MG chewable tablet TAKE 1 TABLET BY MOUTH EVERY DAY (Patient taking differently: Chew 81 mg by mouth daily. ) 30 tablet 11  . atorvastatin (LIPITOR) 20 MG tablet TAKE 1 TABLET BY MOUTH EVERY DAY 90 tablet 0  . carvedilol (COREG) 3.125 MG tablet TAKE 1 TABLET (3.125 MG TOTAL) BY MOUTH 2 (TWO) TIMES DAILY. 90 tablet 3  . Cholecalciferol (VITAMIN D) 2000 units CAPS Take 2,000 Units by mouth daily.     Marland Kitchen ENTRESTO 24-26 MG TAKE 1 TABLET BY MOUTH TWICE A DAY (Patient taking differently: Take 1 tablet by mouth 2 (two) times daily. ) 60 tablet 11  . Evolocumab (REPATHA) 140 MG/ML SOSY Inject 140 mg into the skin every 14 (fourteen) days. 6 mL 11  . ezetimibe (ZETIA) 10 MG tablet TAKE 1 TABLET BY MOUTH EVERY DAY (Patient taking differently: Take 10 mg by mouth daily. ) 30 tablet 11  . FLUoxetine (PROZAC) 40 MG capsule Take 40 mg by mouth daily.     . ivabradine (CORLANOR) 5 MG TABS tablet Take 0.5 tablets (2.5 mg total) by mouth 2 (two) times daily. 90 tablet 3  . levothyroxine (SYNTHROID, LEVOTHROID) 137 MCG tablet Take 137 mcg by mouth daily before breakfast.    . Multiple Vitamin (MULTIVITAMIN WITH MINERALS) TABS tablet Take 1  tablet by mouth daily.    . nitroGLYCERIN (NITROSTAT) 0.4 MG SL tablet Place 1 tablet (0.4 mg total) under the tongue every 5 (five) minutes as needed for chest pain. 30 tablet 2  . pantoprazole (PROTONIX) 40 MG tablet Take 40 mg by mouth daily.     . sildenafil (VIAGRA) 100 MG tablet TAKE 1 (ONE) TABLET AS NEEDED. DO NOT USE AT SAME TIME AS NITRO (Patient taking differently: Take 100 mg by mouth as needed for erectile dysfunction. TAKE 1 (ONE) TABLET AS NEEDED. DO NOT USE AT SAME TIME AS NITRO) 30 tablet 3  . spironolactone (ALDACTONE) 25 MG tablet Take 0.5 tablets (12.5 mg total) by mouth daily. Please disregard duplicate prescription of 90 pills 30 tablet 3   No current facility-administered medications on file prior to visit.    Cardiovascular studies:  Coronary angiography 08/10/2020: LM: Normal LAD: Patent ostial LAD stent. 20% ostial diagonal stenosis LCx: 20% OM1 stenosis RCA: Prox in-stent CTO. Left-to-left collaterals fill up to mid RCA Normal LVEDP    Real-time Outpatient Cardiac Telemetry 04/01/2020 - 04/15/2020: Dominant rhythm: Sinus. HR 44-150 bpm. Avg HR 72 bpm. Supraventricular ectopy, PAC/upto 6 beat run. SVE burden <1% Ventricular ectopy, PVC, NSVT upto 5 beats. VE burden <1% No atrial fibrillation/atrial flutter/SVT/high grade AV block, sinus pause >3sec noted. 12 patient activated events do not correlate  with arrhthymias.    EKG 02/20/2019: Sinus rhythm 64 bpm. Old anteroseptal infarct.   Stress perfusion MRI 11/07/2017: 1. Moderately dilated left ventricle with normal wall thickness and moderately decreased systolic function (LVEF = 43%). There is hypokinesis of the basal anteroseptal, anterior walls and akinesis of the mid inferoseptal, anteroseptal, anterior, all apical walls including true apex. A large infarct in the basal and mid inferior, inferoseptal, anteroseptal, anterior walls and in all the apical walls with no ischemia. 2. Normal right  ventricular size, thickness and systolic function (LVEF = 56%). There are no regional wall motion abnormalities. 3.  Mildly dilated left atrium. 4. Mild mitral and tricuspid regurgitation. Collectively, these findings are consistent with an ischemic cardiomyopathy with a large infarct in the LAD and RCA territory with no peri-infarct ischemia. There is poor chance of recovery if Revascularized.  Cath 08/21/2017: LM: Normal LAD: Patent ostial LAD stent. 20% ostial diagonal stenosis Ramus: 50% proximal stenosis RCA: Not engaged today. Known RCA CTO   Elevated LVEDP LVEF 45%   Continue aggressive medical therapy  Coronary angiogram 07/13/2017: Elevated LVEDP at 24 mmHg, systemic pressures 80/60 mmHg, patient in cardiogenic shock. Left main normal, LAD flush occluded. Ramus intermediate mid 70-80% % stenosis, mild disease in the circumflex. RCA occluded at the prior angioplasty site. Contralateral collaterals from the LAD.    Large LAD and diagonal, successful angioplasty and stenting following thrombectomy with implantation of 3.5 x 26 mm resolute Onyx DES deployed at 14 atmospheric pressure. Diagonal 1 ostial stenosis balloon angioplasty performed with 2.5 mm balloon at 10 atmospheric pressure. Stenosis reduced to 0%, mid flow improved from 0-3.  Carotid artery duplex 10/05/2017: No hemodynamically significant arterial disease in the internal carotid artery bilaterally. Normal study.  Antegrade right vertebral artery flow. Antegrade left vertebral artery flow.  Recent labs: 11/16/2020: Glucose 129, BUN/Cr 13/1.09. EGFR 79. Na/K 140/4.3. Rest of the CMP normal H/H 14/41. MCV 91. Platelets 176 HbA1C 6.1 % Chol 92, TG 77, HDL 61, LDL 17 TSH 0.8 normal   06/30/2020: Glucose 107, BUN/Cr 9/1.1. EGFR >60. Na/K 142/3.7.  H/H 12.6/37. MCV 92. Platelets 210 HbA1C 6.1% Chol 97, TG 96, HDL 32, LDL 46  Results for Bobby [name] (MRN [id number]) as of 07/12/2020 11:38  Ref. Range 06/29/2020  05:19 06/29/2020 11:08 06/29/2020 18:39 06/30/2020 00:00  B Natriuretic Peptide Latest Ref Range: 0.0 - 100.0 pg/mL 457.8 (H)     Troponin I (High Sensitivity) Latest Ref Range: <18 ng/L  28 (H) 29 (H) 25 (H)    03/16/2020: Glucose 121, BUN/Cr 15/1.0. EGFR 86. Na/K 141/4.2. Rest of the CMP normal H/H 15/45. MCV 92. Platelets 206 Chol 165, TG 325, HDL 39, LDL 74  06/2017: HbA1C 6.Marland Kitchen0% TSH 1.0 normal   Review of Systems  Constitutional: Negative for malaise/fatigue.  Cardiovascular: Negative for chest pain, dyspnea on exertion, leg swelling, palpitations and syncope.       Objective:    Vitals:   11/22/20 1425  BP: 103/63  Pulse: 68  Resp: 16  SpO2: 97%     Physical Exam Vitals and nursing note reviewed.  Constitutional:      Appearance: He is well-developed.  Neck:     Vascular: No JVD.  Cardiovascular:     Rate and Rhythm: Normal rate and regular rhythm.     Pulses: Intact distal pulses.     Heart sounds: Normal heart sounds. No murmur heard.   Pulmonary:     Effort: Pulmonary effort is normal.  Breath sounds: Normal breath sounds. No wheezing or rales.         Assessment & Recommendations:   49 year old Caucasian male with coronary artery disease status post anterior wall MI 06/2017 s/p successful LAD revascularization, prior RCA stent now with CTO, EF 43% on MRI in 10/2017 with no reversible ischemia.  Ischemic cardiomyopathy: Acute on chronic HFrEF, with predominant fluid retention being in the form of abdominal bloating.  Increase spironolactone- which he currently takes 12.5 mg every other day, to 25 mg daily. Recheck BMP in 4 days Continue carvedilol 3.125 mg twice daily, Entresto 24-26 mg twice daily, ivabradine 2.5 mg twice daily.  CAD: Unchanged coronary anatomy. Known RCA CTO with no viability in RCA territory on MRI. Continue aspirin 81, Lipitor 20, bisoprolol, Repatha.  Patient is at risk of hospitalization, thus requiring close follow up.    F/u on 12/3 after labs on 12/2.  Bobby Mormon, MD [facility] [place] Pager: [phone] Office: [phone] If no answer Cell [phone]

## 2020-11-23 ENCOUNTER — Encounter: Payer: BC Managed Care – PPO | Admitting: Podiatry

## 2020-11-25 ENCOUNTER — Telehealth: Payer: Self-pay | Admitting: Podiatry

## 2020-11-25 DIAGNOSIS — I5020 Unspecified systolic (congestive) heart failure: Secondary | ICD-10-CM | POA: Diagnosis not present

## 2020-11-25 NOTE — Telephone Encounter (Signed)
DOS: 12/09/2020  Procedures: Exc. Ganglion/Tumor 2nd Lt [phone]) & Hammert[employer] Repair 2nd Lt [id number])  BCBS Effective From 12/26/2019 - 12/24/2020  Deductible: $4,000 with $4,000 met and $0 remaining. Out of Pocket: $8,550 with $8,550 met and $0 remaining. CoInsurance: 40% Copay: $0  Per [employer] No Prior Authorization is required.

## 2020-11-25 NOTE — Progress Notes (Signed)
Patient is here for follow up visit.  Subjective:   Bobby [name], male    DOB: September 21, 1971, 49 y.o.   MRN: [id number]   Chief Complaint  Patient presents with  . Coronary artery disease of native artery of native heart wit  . Follow-up  . Results    HPI   49 year old Caucasian male with coronary artery disease status post anterior wall MI 06/2017 s/p successful LAD revascularization, prior RCA stent now with CTO, EF 43% on MRI in 10/2017 with no reversible ischemia.  Since increasing spironolactone dose, he has lost >10 lbs. Abdominal bloating has improved. Reviewed recent labs with the patient, details below.    Current Outpatient Medications on File Prior to Visit  Medication Sig Dispense Refill  . aspirin 81 MG chewable tablet TAKE 1 TABLET BY MOUTH EVERY DAY (Patient taking differently: Chew 81 mg by mouth daily. ) 30 tablet 11  . atorvastatin (LIPITOR) 20 MG tablet TAKE 1 TABLET BY MOUTH EVERY DAY 90 tablet 0  . carvedilol (COREG) 3.125 MG tablet TAKE 1 TABLET (3.125 MG TOTAL) BY MOUTH 2 (TWO) TIMES DAILY. 90 tablet 3  . Cholecalciferol (VITAMIN D) 2000 units CAPS Take 2,000 Units by mouth daily.     Marland Kitchen ENTRESTO 24-26 MG TAKE 1 TABLET BY MOUTH TWICE A DAY (Patient taking differently: Take 1 tablet by mouth 2 (two) times daily. ) 60 tablet 11  . Evolocumab (REPATHA) 140 MG/ML SOSY Inject 140 mg into the skin every 14 (fourteen) days. 6 mL 11  . ezetimibe (ZETIA) 10 MG tablet TAKE 1 TABLET BY MOUTH EVERY DAY (Patient taking differently: Take 10 mg by mouth daily. ) 30 tablet 11  . FLUoxetine (PROZAC) 40 MG capsule Take 40 mg by mouth daily.     . ivabradine (CORLANOR) 5 MG TABS tablet Take 0.5 tablets (2.5 mg total) by mouth 2 (two) times daily. 90 tablet 3  . levothyroxine (SYNTHROID, LEVOTHROID) 137 MCG tablet Take 137 mcg by mouth daily before breakfast.    . Multiple Vitamin (MULTIVITAMIN WITH MINERALS) TABS tablet Take 1 tablet by mouth daily.    . nitroGLYCERIN  (NITROSTAT) 0.4 MG SL tablet Place 1 tablet (0.4 mg total) under the tongue every 5 (five) minutes as needed for chest pain. 30 tablet 2  . pantoprazole (PROTONIX) 40 MG tablet Take 40 mg by mouth daily.     . sildenafil (VIAGRA) 100 MG tablet TAKE 1 (ONE) TABLET AS NEEDED. DO NOT USE AT SAME TIME AS NITRO (Patient taking differently: Take 100 mg by mouth as needed for erectile dysfunction. TAKE 1 (ONE) TABLET AS NEEDED. DO NOT USE AT SAME TIME AS NITRO) 30 tablet 3  . spironolactone (ALDACTONE) 25 MG tablet Take 1 tablet (25 mg total) by mouth daily. Please disregard duplicate prescription of 90 pills 90 tablet 2   No current facility-administered medications on file prior to visit.    Cardiovascular studies:  Coronary angiography 08/10/2020: LM: Normal LAD: Patent ostial LAD stent. 20% ostial diagonal stenosis LCx: 20% OM1 stenosis RCA: Prox in-stent CTO. Left-to-left collaterals fill up to mid RCA Normal LVEDP    Real-time Outpatient Cardiac Telemetry 04/01/2020 - 04/15/2020: Dominant rhythm: Sinus. HR 44-150 bpm. Avg HR 72 bpm. Supraventricular ectopy, PAC/upto 6 beat run. SVE burden <1% Ventricular ectopy, PVC, NSVT upto 5 beats. VE burden <1% No atrial fibrillation/atrial flutter/SVT/high grade AV block, sinus pause >3sec noted. 12 patient activated events do not correlate with arrhthymias.    EKG 02/20/2019: Sinus rhythm  64 bpm. Old anteroseptal infarct.   Stress perfusion MRI 11/07/2017: 1. Moderately dilated left ventricle with normal wall thickness and moderately decreased systolic function (LVEF = 43%). There is hypokinesis of the basal anteroseptal, anterior walls and akinesis of the mid inferoseptal, anteroseptal, anterior, all apical walls including true apex. A large infarct in the basal and mid inferior, inferoseptal, anteroseptal, anterior walls and in all the apical walls with no ischemia. 2. Normal right ventricular size, thickness and systolic  function (LVEF = 56%). There are no regional wall motion abnormalities. 3.  Mildly dilated left atrium. 4. Mild mitral and tricuspid regurgitation. Collectively, these findings are consistent with an ischemic cardiomyopathy with a large infarct in the LAD and RCA territory with no peri-infarct ischemia. There is poor chance of recovery if Revascularized.  Cath 08/21/2017: LM: Normal LAD: Patent ostial LAD stent. 20% ostial diagonal stenosis Ramus: 50% proximal stenosis RCA: Not engaged today. Known RCA CTO   Elevated LVEDP LVEF 45%   Continue aggressive medical therapy  Coronary angiogram 07/13/2017: Elevated LVEDP at 24 mmHg, systemic pressures 80/60 mmHg, patient in cardiogenic shock. Left main normal, LAD flush occluded. Ramus intermediate mid 70-80% % stenosis, mild disease in the circumflex. RCA occluded at the prior angioplasty site. Contralateral collaterals from the LAD.    Large LAD and diagonal, successful angioplasty and stenting following thrombectomy with implantation of 3.5 x 26 mm resolute Onyx DES deployed at 14 atmospheric pressure. Diagonal 1 ostial stenosis balloon angioplasty performed with 2.5 mm balloon at 10 atmospheric pressure. Stenosis reduced to 0%, mid flow improved from 0-3.  Carotid artery duplex 10/05/2017: No hemodynamically significant arterial disease in the internal carotid artery bilaterally. Normal study.  Antegrade right vertebral artery flow. Antegrade left vertebral artery flow.  Recent labs: 11/25/2020: Glucose 104, BUN/Cr 19/1.31. EGFR 63. Na/K 141/5.1.   11/16/2020: Glucose 129, BUN/Cr 13/1.09. EGFR 79. Na/K 140/4.3. Rest of the CMP normal H/H 14/41. MCV 91. Platelets 176 HbA1C 6.1 % Chol 92, TG 77, HDL 61, LDL 17 TSH 0.8 normal   06/30/2020: Glucose 107, BUN/Cr 9/1.1. EGFR >60. Na/K 142/3.7.  H/H 12.6/37. MCV 92. Platelets 210 HbA1C 6.1% Chol 97, TG 96, HDL 32, LDL 46  03/16/2020: Glucose 121, BUN/Cr 15/1.0. EGFR 86. Na/K 141/4.2.  Rest of the CMP normal H/H 15/45. MCV 92. Platelets 206 Chol 165, TG 325, HDL 39, LDL 74  06/2017: HbA1C 6.Marland Kitchen0% TSH 1.0 normal   Review of Systems  Constitutional: Negative for malaise/fatigue.  Cardiovascular: Negative for chest pain, dyspnea on exertion, leg swelling, palpitations and syncope.       Objective:    Vitals:   11/26/20 1030  BP: 107/75  Pulse: 77  Resp: 16  SpO2: 98%     Physical Exam Vitals and nursing note reviewed.  Constitutional:      Appearance: He is well-developed.  Neck:     Vascular: No JVD.  Cardiovascular:     Rate and Rhythm: Normal rate and regular rhythm.     Pulses: Intact distal pulses.     Heart sounds: Normal heart sounds. No murmur heard.   Pulmonary:     Effort: Pulmonary effort is normal.     Breath sounds: Normal breath sounds. No wheezing or rales.         Assessment & Recommendations:   49 year old Caucasian male with coronary artery disease status post anterior wall MI 06/2017 s/p successful LAD revascularization, prior RCA stent now with CTO, EF 43% on MRI in 10/2017 with no reversible ischemia.  Ischemic cardiomyopathy: Acute on chronic HFrEF, now improving Continue spironolactone 25 mg daily. Added lasix 40 mg daily. Repeat labs in 2 weeks. Continue carvedilol 3.125 mg twice daily, Entresto 24-26 mg twice daily, ivabradine 2.5 mg twice daily.  CAD: Unchanged coronary anatomy (Cath 2021). Known RCA CTO with no viability in RCA territory on MRI. Continue aspirin 81, Lipitor 20, bisoprolol, Repatha.  F/u in 3-4 weeks  Nigel Mormon, MD [facility] [place] Pager: [phone] Office: [phone] If no answer Cell [phone]

## 2020-11-26 ENCOUNTER — Ambulatory Visit: Payer: 59 | Admitting: Cardiology

## 2020-11-26 ENCOUNTER — Other Ambulatory Visit: Payer: Self-pay

## 2020-11-26 ENCOUNTER — Encounter: Payer: Self-pay | Admitting: Cardiology

## 2020-11-26 VITALS — BP 107/75 | HR 77 | Resp 16 | Ht 73.0 in | Wt 223.0 lb

## 2020-11-26 DIAGNOSIS — I255 Ischemic cardiomyopathy: Secondary | ICD-10-CM | POA: Diagnosis not present

## 2020-11-26 DIAGNOSIS — I2510 Atherosclerotic heart disease of native coronary artery without angina pectoris: Secondary | ICD-10-CM | POA: Diagnosis not present

## 2020-11-26 DIAGNOSIS — I5020 Unspecified systolic (congestive) heart failure: Secondary | ICD-10-CM

## 2020-11-26 LAB — BASIC METABOLIC PANEL
BUN/Creatinine Ratio: 15 (ref 9–20)
BUN: 19 mg/dL (ref 6–24)
CO2: 22 mmol/L (ref 20–29)
Calcium: 9.9 mg/dL (ref 8.7–10.2)
Chloride: 104 mmol/L (ref 96–106)
Creatinine, Ser: 1.31 mg/dL — ABNORMAL HIGH (ref 0.76–1.27)
GFR calc Af Amer: 72 mL/min/{1.73_m2} (ref >59)
GFR calc non Af Amer: 63 mL/min/{1.73_m2} (ref >59)
Glucose: 104 mg/dL — ABNORMAL HIGH (ref 65–99)
Potassium: 5.1 mmol/L (ref 3.5–5.2)
Sodium: 141 mmol/L (ref 134–144)

## 2020-11-26 MED ORDER — SPIRONOLACTONE 25 MG PO TABS: 25.0000 mg | ORAL_TABLET | Freq: Every day | ORAL | 2 refills | Status: DC

## 2020-11-26 MED ORDER — FUROSEMIDE 40 MG PO TABS: 40.0000 mg | ORAL_TABLET | Freq: Every day | ORAL | 2 refills | Status: DC

## 2020-12-02 DIAGNOSIS — G4733 Obstructive sleep apnea (adult) (pediatric): Secondary | ICD-10-CM | POA: Diagnosis not present

## 2020-12-07 ENCOUNTER — Encounter: Payer: BC Managed Care – PPO | Admitting: Podiatry

## 2020-12-09 ENCOUNTER — Other Ambulatory Visit: Payer: Self-pay | Admitting: Podiatry

## 2020-12-09 DIAGNOSIS — M2042 Other hammer toe(s) (acquired), left foot: Secondary | ICD-10-CM | POA: Diagnosis not present

## 2020-12-09 DIAGNOSIS — M71372 Other bursal cyst, left ankle and foot: Secondary | ICD-10-CM | POA: Diagnosis not present

## 2020-12-09 DIAGNOSIS — M67472 Ganglion, left ankle and foot: Secondary | ICD-10-CM | POA: Diagnosis not present

## 2020-12-09 MED ORDER — OXYCODONE-ACETAMINOPHEN 5-325 MG PO TABS: 1.0000 | ORAL_TABLET | ORAL | 0 refills | Status: DC | PRN

## 2020-12-09 MED ORDER — IBUPROFEN 800 MG PO TABS
800.0000 mg | ORAL_TABLET | Freq: Three times a day (TID) | ORAL | 1 refills | Status: DC
Start: 2020-12-09 — End: 2021-04-11

## 2020-12-09 NOTE — Progress Notes (Signed)
PRN postop 

## 2020-12-14 ENCOUNTER — Encounter: Payer: BC Managed Care – PPO | Admitting: Podiatry

## 2020-12-15 ENCOUNTER — Other Ambulatory Visit: Payer: Self-pay

## 2020-12-15 ENCOUNTER — Ambulatory Visit (INDEPENDENT_AMBULATORY_CARE_PROVIDER_SITE_OTHER): Payer: BC Managed Care – PPO

## 2020-12-15 ENCOUNTER — Ambulatory Visit (INDEPENDENT_AMBULATORY_CARE_PROVIDER_SITE_OTHER): Payer: BC Managed Care – PPO | Admitting: Podiatry

## 2020-12-15 DIAGNOSIS — Z9889 Other specified postprocedural states: Secondary | ICD-10-CM

## 2020-12-20 NOTE — Progress Notes (Signed)
   Subjective:  Patient presents today status post DIPJ arthroplasty second digit left foot. DOS: 12/09/2020.  Patient states he is doing very well.  He has minimal pain.  Aggravated by excessive walking.  He has been wearing the surgical shoe as instructed.  He presents for further treatment evaluation  Past Medical History:  Diagnosis Date  . Acute MI anterior wall first episode care [facility]) 07/13/2017   Occluded ostial LAD S/P 3.5x26 Onyx DES. RI 80%. RCA Occluded at prior stent site with collaterals   . Cardiogenic shock (HCC) 07/13/2017  . CHF (congestive heart failure) (HCC)   . Graves disease   . Hypertension   . MI (myocardial infarction) (HCC)   . Thyroid disease    thyroid irradiated per pt      Objective/Physical Exam Neurovascular status intact.  Skin incisions appear to be well coapted with sutures  intact. No sign of infectious process noted. No dehiscence. No active bleeding noted. Moderate edema noted to the surgical extremity.  Radiographic Exam:  Osteotomy of the base of the distal phalanx of the second toe appear to be stable with routine healing.  Assessment: 1. s/p DIPJ arthroplasty second digit left foot. DOS: 12/09/2020   Plan of Care:  1. Patient was evaluated. X-rays reviewed 2.  Patient may discontinue postsurgical shoe.  Recommend antibiotic ointment and a Band-Aid over the incision site 3.  Return to clinic in 2 weeks for suture removal   Felecia Shelling, DPM [facility]  Dr. Felecia Shelling, DPM    [address] [place], [place] [address]                Office [phone]  Fax [phone]

## 2020-12-21 ENCOUNTER — Encounter: Payer: BC Managed Care – PPO | Admitting: Podiatry

## 2020-12-23 ENCOUNTER — Other Ambulatory Visit: Payer: Self-pay | Admitting: Cardiology

## 2020-12-23 DIAGNOSIS — I255 Ischemic cardiomyopathy: Secondary | ICD-10-CM

## 2020-12-28 ENCOUNTER — Ambulatory Visit: Payer: BC Managed Care – PPO | Admitting: Cardiology

## 2020-12-29 ENCOUNTER — Other Ambulatory Visit: Payer: Self-pay

## 2020-12-29 ENCOUNTER — Other Ambulatory Visit: Payer: Self-pay | Admitting: Cardiology

## 2020-12-29 DIAGNOSIS — I5020 Unspecified systolic (congestive) heart failure: Secondary | ICD-10-CM

## 2020-12-29 DIAGNOSIS — I2510 Atherosclerotic heart disease of native coronary artery without angina pectoris: Secondary | ICD-10-CM

## 2020-12-29 DIAGNOSIS — I255 Ischemic cardiomyopathy: Secondary | ICD-10-CM

## 2020-12-29 MED ORDER — FUROSEMIDE 40 MG PO TABS: 40.0000 mg | ORAL_TABLET | Freq: Every day | ORAL | 0 refills | Status: DC

## 2020-12-29 MED ORDER — EZETIMIBE 10 MG PO TABS: 10.0000 mg | ORAL_TABLET | Freq: Every day | ORAL | 0 refills | Status: DC

## 2020-12-29 MED ORDER — SPIRONOLACTONE 25 MG PO TABS: 25.0000 mg | ORAL_TABLET | Freq: Every day | ORAL | 0 refills | Status: DC

## 2020-12-29 MED ORDER — REPATHA 140 MG/ML ~~LOC~~ SOSY: 140.0000 mg | PREFILLED_SYRINGE | SUBCUTANEOUS | 0 refills | Status: DC

## 2020-12-29 MED ORDER — IVABRADINE HCL 5 MG PO TABS: 2.5000 mg | ORAL_TABLET | Freq: Two times a day (BID) | ORAL | 0 refills | Status: DC

## 2020-12-29 MED ORDER — ATORVASTATIN CALCIUM 20 MG PO TABS: 20.0000 mg | ORAL_TABLET | Freq: Every day | ORAL | 0 refills | Status: DC

## 2020-12-29 MED ORDER — CARVEDILOL 3.125 MG PO TABS: 3.1250 mg | ORAL_TABLET | Freq: Two times a day (BID) | ORAL | 0 refills | Status: DC

## 2020-12-29 MED ORDER — NITROGLYCERIN 0.4 MG SL SUBL: 0.4000 mg | SUBLINGUAL_TABLET | SUBLINGUAL | 2 refills | Status: DC | PRN

## 2020-12-29 MED ORDER — ENTRESTO 24-26 MG PO TABS: 1.0000 | ORAL_TABLET | Freq: Two times a day (BID) | ORAL | 0 refills | Status: DC

## 2020-12-30 ENCOUNTER — Other Ambulatory Visit: Payer: Self-pay | Admitting: Family Medicine

## 2020-12-31 ENCOUNTER — Encounter: Payer: Self-pay | Admitting: Podiatry

## 2020-12-31 ENCOUNTER — Other Ambulatory Visit: Payer: Self-pay

## 2020-12-31 ENCOUNTER — Ambulatory Visit (INDEPENDENT_AMBULATORY_CARE_PROVIDER_SITE_OTHER): Payer: Self-pay | Admitting: Podiatry

## 2020-12-31 DIAGNOSIS — Z9889 Other specified postprocedural states: Secondary | ICD-10-CM

## 2020-12-31 DIAGNOSIS — M6740 Ganglion, unspecified site: Secondary | ICD-10-CM

## 2020-12-31 NOTE — Progress Notes (Signed)
   Subjective:  Patient presents today status post DIPJ arthroplasty second digit left foot. DOS: 12/09/2020.  Patient states that he is doing well.  He does have some minimal tenderness to the toe throughout the day.  Otherwise no new complaints at this time.  He is wearing good supportive shoes and performing his daily activities  Past Medical History:  Diagnosis Date  . Acute MI anterior wall first episode care [facility]) 07/13/2017   Occluded ostial LAD S/P 3.5x26 Onyx DES. RI 80%. RCA Occluded at prior stent site with collaterals   . Cardiogenic shock ([place]) 07/13/2017  . CHF (congestive heart failure) ([place])   . Graves disease   . Hypertension   . MI (myocardial infarction) ([place])   . Thyroid disease    thyroid irradiated per pt      Objective/Physical Exam Neurovascular status intact.  Skin incisions appear to be well coapted and healed. No sign of infectious process noted. No dehiscence. No active bleeding noted. Moderate edema noted to the surgical toe.  Assessment: 1. s/p DIPJ arthroplasty second digit left foot. DOS: 12/09/2020   Plan of Care:  1. Patient was evaluated. 2.  Patient may now resume full activity no restrictions 3.  Recommend good supportive shoe gear 4.  Return to clinic as needed   Edrick Kins, DPM [facility]  Dr. Edrick Kins, DPM    [address] [place], [place] [address]                Office [phone]  Fax [phone]

## 2021-01-04 ENCOUNTER — Encounter: Payer: BC Managed Care – PPO | Admitting: Podiatry

## 2021-01-06 DIAGNOSIS — I5020 Unspecified systolic (congestive) heart failure: Secondary | ICD-10-CM | POA: Diagnosis not present

## 2021-01-07 LAB — BASIC METABOLIC PANEL
BUN/Creatinine Ratio: 15 (ref 9–20)
BUN: 20 mg/dL (ref 6–24)
CO2: 25 mmol/L (ref 20–29)
Calcium: 9.8 mg/dL (ref 8.7–10.2)
Chloride: 101 mmol/L (ref 96–106)
Creatinine, Ser: 1.35 mg/dL — ABNORMAL HIGH (ref 0.76–1.27)
GFR calc Af Amer: 70 mL/min/{1.73_m2} (ref >59)
GFR calc non Af Amer: 60 mL/min/{1.73_m2} (ref >59)
Glucose: 102 mg/dL — ABNORMAL HIGH (ref 65–99)
Potassium: 5.1 mmol/L (ref 3.5–5.2)
Sodium: 140 mmol/L (ref 134–144)

## 2021-01-14 ENCOUNTER — Other Ambulatory Visit: Payer: Self-pay

## 2021-01-14 ENCOUNTER — Encounter: Payer: Self-pay | Admitting: Cardiology

## 2021-01-14 ENCOUNTER — Ambulatory Visit: Payer: 59 | Admitting: Cardiology

## 2021-01-14 VITALS — BP 116/84 | HR 65 | Temp 98.2°F | Resp 16 | Ht 73.0 in | Wt 227.0 lb

## 2021-01-14 DIAGNOSIS — I5020 Unspecified systolic (congestive) heart failure: Secondary | ICD-10-CM

## 2021-01-14 DIAGNOSIS — I2510 Atherosclerotic heart disease of native coronary artery without angina pectoris: Secondary | ICD-10-CM

## 2021-01-14 NOTE — Progress Notes (Signed)
Patient is here for follow up visit.  Subjective:   Bobby [name], male    DOB: 1971-07-01, 50 y.o.   MRN: [id number]   Chief Complaint  Patient presents with   HFrEF   Follow-up    3 week    HPI   50 year old Caucasian male with coronary artery disease status post anterior wall MI 06/2017 s/p successful LAD revascularization, prior RCA stent now with CTO, EF 43% on MRI in 10/2017 with no reversible ischemia.  Since increasing spironolactone dose, he has lost >10 lbs. Abdominal bloating has improved. Reviewed recent labs with the patient, details below.    Current Outpatient Medications on File Prior to Visit  Medication Sig Dispense Refill   aspirin 81 MG chewable tablet TAKE 1 TABLET BY MOUTH EVERY DAY (Patient taking differently: Chew 81 mg by mouth daily. ) 30 tablet 11   atorvastatin (LIPITOR) 20 MG tablet Take 1 tablet (20 mg total) by mouth daily. 90 tablet 0   carvedilol (COREG) 3.125 MG tablet Take 1 tablet (3.125 mg total) by mouth 2 (two) times daily. 90 tablet 0   Cholecalciferol (VITAMIN D) 2000 units CAPS Take 2,000 Units by mouth daily.      Evolocumab (REPATHA) 140 MG/ML SOSY Inject 140 mg into the skin every 14 (fourteen) days. 6 mL 0   ezetimibe (ZETIA) 10 MG tablet Take 1 tablet (10 mg total) by mouth daily. 90 tablet 0   FLUoxetine (PROZAC) 40 MG capsule Take 40 mg by mouth daily.      furosemide (LASIX) 40 MG tablet Take 1 tablet (40 mg total) by mouth daily. 90 tablet 0   ibuprofen (ADVIL) 800 MG tablet Take 1 tablet (800 mg total) by mouth 3 (three) times daily. 90 tablet 1   ivabradine (CORLANOR) 5 MG TABS tablet Take 0.5 tablets (2.5 mg total) by mouth 2 (two) times daily. 90 tablet 0   levothyroxine (SYNTHROID, LEVOTHROID) 137 MCG tablet Take 137 mcg by mouth daily before breakfast.     Multiple Vitamin (MULTIVITAMIN WITH MINERALS) TABS tablet Take 1 tablet by mouth daily.     nitroGLYCERIN (NITROSTAT) 0.4 MG SL tablet Place 1 tablet (0.4  mg total) under the tongue every 5 (five) minutes as needed for chest pain. 30 tablet 2   oxyCODONE-acetaminophen (PERCOCET) 5-325 MG tablet Take 1 tablet by mouth every 4 (four) hours as needed for severe pain. 30 tablet 0   pantoprazole (PROTONIX) 40 MG tablet Take 40 mg by mouth daily.      sacubitril-valsartan (ENTRESTO) 24-26 MG Take 1 tablet by mouth 2 (two) times daily. 180 tablet 0   sildenafil (VIAGRA) 100 MG tablet TAKE 1 (ONE) TABLET AS NEEDED. DO NOT USE AT SAME TIME AS NITRO (Patient taking differently: Take 100 mg by mouth as needed for erectile dysfunction. TAKE 1 (ONE) TABLET AS NEEDED. DO NOT USE AT SAME TIME AS NITRO) 30 tablet 3   spironolactone (ALDACTONE) 25 MG tablet Take 1 tablet (25 mg total) by mouth daily. Please disregard duplicate prescription of 90 pills 90 tablet 0   No current facility-administered medications on file prior to visit.    Cardiovascular studies:  EKG 01/14/2021: Sinus rhythm 63 bpm  Old anteroseptal infarct Poor R wave progression  Coronary angiography 08/10/2020: LM: Normal LAD: Patent ostial LAD stent. 20% ostial diagonal stenosis LCx: 20% OM1 stenosis RCA: Prox in-stent CTO. Left-to-left collaterals fill up to mid RCA Normal LVEDP    Real-time Outpatient Cardiac Telemetry 04/01/2020 -  04/15/2020: Dominant rhythm: Sinus. HR 44-150 bpm. Avg HR 72 bpm. Supraventricular ectopy, PAC/upto 6 beat run. SVE burden <1% Ventricular ectopy, PVC, NSVT upto 5 beats. VE burden <1% No atrial fibrillation/atrial flutter/SVT/high grade AV block, sinus pause >3sec noted. 12 patient activated events do not correlate with arrhthymias.   Stress perfusion MRI 11/07/2017: 1. Moderately dilated left ventricle with normal wall thickness and moderately decreased systolic function (LVEF = 43%). There is hypokinesis of the basal anteroseptal, anterior walls and akinesis of the mid inferoseptal, anteroseptal, anterior, all apical walls including true  apex. A large infarct in the basal and mid inferior, inferoseptal, anteroseptal, anterior walls and in all the apical walls with no ischemia. 2. Normal right ventricular size, thickness and systolic function (LVEF = 56%). There are no regional wall motion abnormalities. 3.  Mildly dilated left atrium. 4. Mild mitral and tricuspid regurgitation. Collectively, these findings are consistent with an ischemic cardiomyopathy with a large infarct in the LAD and RCA territory with no peri-infarct ischemia. There is poor chance of recovery if Revascularized.  Cath 08/21/2017: LM: Normal LAD: Patent ostial LAD stent. 20% ostial diagonal stenosis Ramus: 50% proximal stenosis RCA: Not engaged today. Known RCA CTO   Elevated LVEDP LVEF 45%   Continue aggressive medical therapy  Coronary angiogram 07/13/2017: Elevated LVEDP at 24 mmHg, systemic pressures 80/60 mmHg, patient in cardiogenic shock. Left main normal, LAD flush occluded. Ramus intermediate mid 70-80% % stenosis, mild disease in the circumflex. RCA occluded at the prior angioplasty site. Contralateral collaterals from the LAD.    Large LAD and diagonal, successful angioplasty and stenting following thrombectomy with implantation of 3.5 x 26 mm resolute Onyx DES deployed at 14 atmospheric pressure. Diagonal 1 ostial stenosis balloon angioplasty performed with 2.5 mm balloon at 10 atmospheric pressure. Stenosis reduced to 0%, mid flow improved from 0-3.  Carotid artery duplex 10/05/2017: No hemodynamically significant arterial disease in the internal carotid artery bilaterally. Normal study.  Antegrade right vertebral artery flow. Antegrade left vertebral artery flow.  Recent labs: 01/06/2021: Glucose 102, BUN/Cr 20/1.35. EGFR 60. Na/K 140/5.1.   11/25/2020: Glucose 104, BUN/Cr 19/1.31. EGFR 63. Na/K 141/5.1.   11/16/2020: Glucose 129, BUN/Cr 13/1.09. EGFR 79. Na/K 140/4.3. Rest of the CMP normal H/H 14/41. MCV 91. Platelets  176 HbA1C 6.1 % Chol 92, TG 77, HDL 61, LDL 17 TSH 0.8 normal  Review of Systems  Constitutional: Negative for malaise/fatigue.  Cardiovascular: Negative for chest pain, dyspnea on exertion, leg swelling, palpitations and syncope.       Objective:    Vitals:   01/14/21 1326  BP: 116/84  Pulse: 65  Resp: 16  Temp: 98.2 F (36.8 C)  SpO2: 97%     Physical Exam Vitals and nursing note reviewed.  Constitutional:      Appearance: He is well-developed.  Neck:     Vascular: No JVD.  Cardiovascular:     Rate and Rhythm: Normal rate and regular rhythm.     Pulses: Intact distal pulses.     Heart sounds: Normal heart sounds. No murmur heard.   Pulmonary:     Effort: Pulmonary effort is normal.     Breath sounds: Normal breath sounds. No wheezing or rales.         Assessment & Recommendations:   50 year old Caucasian male with coronary artery disease status post anterior wall MI 06/2017 s/p successful LAD revascularization, prior RCA stent now with CTO, EF 43% on MRI in 10/2017 with no reversible ischemia.  Ischemic cardiomyopathy:  HFrEF, stable Continue spironolactone 25 mg daily, lasix 40 mg daily prn. Continue carvedilol 3.125 mg twice daily, Entresto 24-26 mg twice daily, ivabradine 2.5 mg twice daily.  CAD: Unchanged coronary anatomy (Cath 2021). Known RCA CTO with no viability in RCA territory on MRI. Continue aspirin 81, Lipitor 20, bisoprolol, Repatha.  F/u in 6 months   Esther Hardy, MD [facility] Pager: [phone] Office: [phone] If no answer Cell [phone]

## 2021-02-04 ENCOUNTER — Other Ambulatory Visit: Payer: Self-pay | Admitting: Cardiology

## 2021-02-04 DIAGNOSIS — I2510 Atherosclerotic heart disease of native coronary artery without angina pectoris: Secondary | ICD-10-CM

## 2021-02-19 ENCOUNTER — Other Ambulatory Visit: Payer: Self-pay | Admitting: Cardiology

## 2021-02-19 DIAGNOSIS — I255 Ischemic cardiomyopathy: Secondary | ICD-10-CM

## 2021-03-07 ENCOUNTER — Other Ambulatory Visit: Payer: Self-pay | Admitting: Cardiology

## 2021-03-07 ENCOUNTER — Other Ambulatory Visit: Payer: Self-pay

## 2021-03-07 DIAGNOSIS — I2510 Atherosclerotic heart disease of native coronary artery without angina pectoris: Secondary | ICD-10-CM

## 2021-03-07 MED ORDER — REPATHA 140 MG/ML ~~LOC~~ SOSY: 140.0000 mg | PREFILLED_SYRINGE | SUBCUTANEOUS | 0 refills | Status: DC

## 2021-03-17 ENCOUNTER — Other Ambulatory Visit: Payer: Self-pay | Admitting: Cardiology

## 2021-03-17 DIAGNOSIS — I255 Ischemic cardiomyopathy: Secondary | ICD-10-CM

## 2021-04-04 NOTE — Telephone Encounter (Signed)
Please move to next available appt.  Thanks MJP

## 2021-04-05 NOTE — Telephone Encounter (Signed)
Please make sure he gets a closer f/u appt, wither with me or CC  Thanks MJP

## 2021-04-07 ENCOUNTER — Ambulatory Visit: Payer: BC Managed Care – PPO | Admitting: Adult Health

## 2021-04-07 NOTE — Telephone Encounter (Signed)
Noted  

## 2021-04-11 ENCOUNTER — Other Ambulatory Visit: Payer: Self-pay

## 2021-04-11 DIAGNOSIS — I255 Ischemic cardiomyopathy: Secondary | ICD-10-CM

## 2021-04-11 DIAGNOSIS — I5020 Unspecified systolic (congestive) heart failure: Secondary | ICD-10-CM

## 2021-04-11 DIAGNOSIS — I2510 Atherosclerotic heart disease of native coronary artery without angina pectoris: Secondary | ICD-10-CM

## 2021-04-11 DIAGNOSIS — N529 Male erectile dysfunction, unspecified: Secondary | ICD-10-CM

## 2021-04-11 MED ORDER — FUROSEMIDE 40 MG PO TABS
40.0000 mg | ORAL_TABLET | Freq: Every day | ORAL | 3 refills | Status: DC
Start: 2021-04-11 — End: 2021-04-20
  Filled 2021-04-11: qty 90, 90d supply, fill #0

## 2021-04-11 MED ORDER — EZETIMIBE 10 MG PO TABS
10.0000 mg | ORAL_TABLET | Freq: Every day | ORAL | 3 refills | Status: DC
  Filled 2021-04-11: qty 90, 90d supply, fill #0

## 2021-04-11 MED ORDER — LEVOTHYROXINE SODIUM 137 MCG PO TABS
137.0000 ug | ORAL_TABLET | Freq: Every day | ORAL | 3 refills | Status: DC
  Filled 2021-04-11: qty 90, 90d supply, fill #0

## 2021-04-11 MED ORDER — VITAMIN D 50 MCG (2000 UT) PO CAPS
2000.0000 [IU] | ORAL_CAPSULE | Freq: Every day | ORAL | 3 refills | Status: DC
  Filled 2021-04-11: qty 30, 30d supply, fill #0

## 2021-04-11 MED ORDER — ENTRESTO 24-26 MG PO TABS
1.0000 | ORAL_TABLET | Freq: Two times a day (BID) | ORAL | 3 refills | Status: DC
  Filled 2021-04-11: qty 180, 90d supply, fill #0
  Filled 2021-07-11: qty 180, 90d supply, fill #1
  Filled 2021-10-10: qty 180, 90d supply, fill #2
  Filled 2021-12-20: qty 180, 90d supply, fill #3

## 2021-04-11 MED ORDER — ATORVASTATIN CALCIUM 20 MG PO TABS
1.0000 | ORAL_TABLET | Freq: Every day | ORAL | 3 refills | Status: DC
  Filled 2021-04-11: qty 90, 90d supply, fill #0

## 2021-04-11 MED ORDER — ADULT MULTIVITAMIN W/MINERALS CH
1.0000 | ORAL_TABLET | Freq: Every day | ORAL | 3 refills | Status: DC
  Filled 2021-04-11: qty 90, 90d supply, fill #0

## 2021-04-11 MED ORDER — SILDENAFIL CITRATE 100 MG PO TABS
100.0000 mg | ORAL_TABLET | ORAL | 3 refills | Status: DC | PRN
  Filled 2021-04-11: qty 6, 30d supply, fill #0
  Filled 2021-07-11: qty 6, 30d supply, fill #1
  Filled 2021-12-20: qty 6, 30d supply, fill #2

## 2021-04-11 MED ORDER — IVABRADINE HCL 5 MG PO TABS
ORAL_TABLET | ORAL | 0 refills | Status: DC
Start: 2021-04-11 — End: 2021-04-20
  Filled 2021-04-11: qty 90, 90d supply, fill #0

## 2021-04-11 MED ORDER — NITROGLYCERIN 0.4 MG SL SUBL
0.4000 mg | SUBLINGUAL_TABLET | SUBLINGUAL | 2 refills | Status: DC | PRN
  Filled 2021-04-11: qty 25, 1d supply, fill #0

## 2021-04-11 MED ORDER — ASPIRIN 81 MG PO CHEW
81.0000 mg | CHEWABLE_TABLET | Freq: Every day | ORAL | 11 refills | Status: AC
  Filled 2021-04-11: qty 30, 30d supply, fill #0

## 2021-04-11 MED ORDER — REPATHA SURECLICK 140 MG/ML ~~LOC~~ SOAJ
SUBCUTANEOUS | 4 refills | Status: DC
  Filled 2021-04-11: qty 6, 84d supply, fill #0
  Filled 2021-08-08: qty 6, 84d supply, fill #1
  Filled 2021-10-24: qty 6, 84d supply, fill #2
  Filled 2022-02-01 – 2022-02-02 (×2): qty 6, 84d supply, fill #3

## 2021-04-11 MED ORDER — PANTOPRAZOLE SODIUM 40 MG PO TBEC
DELAYED_RELEASE_TABLET | Freq: Every day | ORAL | 3 refills | Status: DC
  Filled 2021-04-11: qty 90, 90d supply, fill #0
  Filled 2021-07-11: qty 90, 90d supply, fill #1
  Filled 2021-10-10: qty 90, 90d supply, fill #2
  Filled 2021-12-20: qty 90, 90d supply, fill #3

## 2021-04-11 MED ORDER — IBUPROFEN 800 MG PO TABS
800.0000 mg | ORAL_TABLET | Freq: Three times a day (TID) | ORAL | 2 refills | Status: DC
  Filled 2021-04-11: qty 90, 30d supply, fill #0

## 2021-04-11 MED ORDER — FLUOXETINE HCL 40 MG PO CAPS
40.0000 mg | ORAL_CAPSULE | Freq: Every day | ORAL | 3 refills | Status: DC
  Filled 2021-04-11: qty 90, 90d supply, fill #0

## 2021-04-11 MED ORDER — SPIRONOLACTONE 25 MG PO TABS
25.0000 mg | ORAL_TABLET | Freq: Every day | ORAL | 3 refills | Status: DC
  Filled 2021-04-11: qty 90, 90d supply, fill #0
  Filled 2021-10-31: qty 90, 90d supply, fill #1
  Filled 2022-02-01: qty 90, 90d supply, fill #2

## 2021-04-11 MED ORDER — CARVEDILOL 3.125 MG PO TABS
ORAL_TABLET | Freq: Two times a day (BID) | ORAL | 3 refills | Status: DC
  Filled 2021-04-11: qty 90, 90d supply, fill #0

## 2021-04-11 MED FILL — Evolocumab Subcutaneous Soln Prefilled Syringe 140 MG/ML: SUBCUTANEOUS | 28 days supply | Qty: 2 | Fill #0 | Status: CN

## 2021-04-12 ENCOUNTER — Other Ambulatory Visit: Payer: Self-pay

## 2021-04-13 ENCOUNTER — Other Ambulatory Visit: Payer: Self-pay

## 2021-04-13 ENCOUNTER — Ambulatory Visit: Payer: BC Managed Care – PPO | Admitting: Family Medicine

## 2021-04-13 NOTE — Patient Instructions (Incomplete)
Please continue using your CPAP regularly. While your insurance requires that you use CPAP at least 4 hours each night on 70% of the nights, I recommend, that you not skip any nights and use it throughout the night if you can. Getting used to CPAP and staying with the treatment long term does take time and patience and discipline. Untreated obstructive sleep apnea when it is moderate to severe can have an adverse impact on cardiovascular health and raise her risk for heart disease, arrhythmias, hypertension, congestive heart failure, stroke and diabetes. Untreated obstructive sleep apnea causes sleep disruption, nonrestorative sleep, and sleep deprivation. This can have an impact on your day to day functioning and cause daytime sleepiness and impairment of cognitive function, memory loss, mood disturbance, and problems focussing. Using CPAP regularly can improve these symptoms.   Follow up in 1 year   Sleep Apnea Sleep apnea affects breathing during sleep. It causes breathing to stop for a short time or to become shallow. It can also increase the risk of:  Heart attack.  Stroke.  Being very overweight (obese).  Diabetes.  Heart failure.  Irregular heartbeat. The goal of treatment is to help you breathe normally again. What are the causes? There are three kinds of sleep apnea:  Obstructive sleep apnea. This is caused by a blocked or collapsed airway.  Central sleep apnea. This happens when the brain does not send the right signals to the muscles that control breathing.  Mixed sleep apnea. This is a combination of obstructive and central sleep apnea. The most common cause of this condition is a collapsed or blocked airway. This can happen if:  Your throat muscles are too relaxed.  Your tongue and tonsils are too large.  You are overweight.  Your airway is too small.   What increases the risk?  Being overweight.  Smoking.  Having a small airway.  Being older.  Being  male.  Drinking alcohol.  Taking medicines to calm yourself (sedatives or tranquilizers).  Having family members with the condition. What are the signs or symptoms?  Trouble staying asleep.  Being sleepy or tired during the day.  Getting angry a lot.  Loud snoring.  Headaches in the morning.  Not being able to focus your mind (concentrate).  Forgetting things.  Less interest in sex.  Mood swings.  Personality changes.  Feelings of sadness (depression).  Waking up a lot during the night to pee (urinate).  Dry mouth.  Sore throat. How is this diagnosed?  Your medical history.  A physical exam.  A test that is done when you are sleeping (sleep study). The test is most often done in a sleep lab but may also be done at home. How is this treated?  Sleeping on your side.  Using a medicine to get rid of mucus in your nose (decongestant).  Avoiding the use of alcohol, medicines to help you relax, or certain pain medicines (narcotics).  Losing weight, if needed.  Changing your diet.  Not smoking.  Using a machine to open your airway while you sleep, such as: ? An oral appliance. This is a mouthpiece that shifts your lower jaw forward. ? A CPAP device. This device blows air through a mask when you breathe out (exhale). ? An EPAP device. This has valves that you put in each nostril. ? A BPAP device. This device blows air through a mask when you breathe in (inhale) and breathe out.  Having surgery if other treatments do not work. It   is important to get treatment for sleep apnea. Without treatment, it can lead to:  High blood pressure.  Coronary artery disease.  In men, not being able to have an erection (impotence).  Reduced thinking ability.   Follow these instructions at home: Lifestyle  Make changes that your doctor recommends.  Eat a healthy diet.  Lose weight if needed.  Avoid alcohol, medicines to help you relax, and some pain  medicines.  Do not use any products that contain nicotine or tobacco, such as cigarettes, e-cigarettes, and chewing tobacco. If you need help quitting, ask your doctor. General instructions  Take over-the-counter and prescription medicines only as told by your doctor.  If you were given a machine to use while you sleep, use it only as told by your doctor.  If you are having surgery, make sure to tell your doctor you have sleep apnea. You may need to bring your device with you.  Keep all follow-up visits as told by your doctor. This is important. Contact a doctor if:  The machine that you were given to use during sleep bothers you or does not seem to be working.  You do not get better.  You get worse. Get help right away if:  Your chest hurts.  You have trouble breathing in enough air.  You have an uncomfortable feeling in your back, arms, or stomach.  You have trouble talking.  One side of your body feels weak.  A part of your face is hanging down. These symptoms may be an emergency. Do not wait to see if the symptoms will go away. Get medical help right away. Call your local emergency services (911 in the [place] Do not drive yourself to the hospital. Summary  This condition affects breathing during sleep.  The most common cause is a collapsed or blocked airway.  The goal of treatment is to help you breathe normally while you sleep. This information is not intended to replace advice given to you by your health care provider. Make sure you discuss any questions you have with your health care provider. Document Revised: 09/27/2018 Document Reviewed: 08/06/2018 [employer] Patient Education  2021 [employer]  

## 2021-04-13 NOTE — Progress Notes (Deleted)
PATIENT: Armonte Tortorella DOB: Feb 13, 1971  REASON FOR VISIT: follow up HISTORY FROM: patient  No chief complaint on file.    HISTORY OF PRESENT ILLNESS: 04/13/21 ALL:  Dean Goldner is a 50 y.o. male here today for follow up for OSA on CPAP.       HISTORY: (copied from Dr Guadelupe Sabin previous note)  Mr. Alligood is a 50 year old right-handed gentleman with an underlying medical history of allergic rhinitis, hypertension, hyperlipidemia, thyroid disease, coronary artery disease, status post MI, status post stent placement, Ischemic cardiomyopathy, chronic systolic congestive heart failure, suspected A. fib, and mild obesity, who presents for follow-up consultation of his obstructive sleep apnea after interim sleep testing and starting AutoPap therapy.  He was referred by his cardiologist and I saw him on 04/01/2020 for concern for sleep apnea.  He reported witnessed apneas and daytime somnolence with an Epworth sleepiness score of 16 out of 24 at the time.  He was advised to proceed with sleep testing.  He had a home sleep test on 06/07/2020 which showed moderate obstructive sleep apnea with an AHI of 26/h, O2 nadir of 83%.  He was advised to start AutoPap therapy.  Set up date was 07/08/2020.  Today, 10/07/2020: I reviewed his AutoPap compliance data from 09/06/2020 through 10/05/2020, which is a total of 30 days, during which time he used his AutoPap every night with percent used days greater than 4 hours at 93%, indicating excellent compliance with an average usage of 6 hours and 22 minutes, residual AHI at goal but on the higher end at 4.1/h, 95th percentile of pressure at 9.1 cm with a range of 6-13 with EPR, leak acceptable with a 95th percentile at 16.9 L/min.  He reports doing better.  He had issues adjusting to AutoPap therapy in the first month and a half or even 2 months but eventually has been able to use it consistently.  He uses a nasal mask.  Sometimes he pulls off the mask in the middle of  the night and does not realize it.  Overall, he feels that he is better rested, has better sleep consolidation, and less daytime somnolence.  He is highly motivated to continue with treatment.  He is quite pleased with how he is doing thus far.  He is hopeful that sleep apnea treatment will also positively impact his heart disease.  He has changed his filter.  He is mindful of using distilled water in the machine but sometimes has difficulty finding distilled water.  He is mindful of cleanliness with the machine. Of note, he was hospitalized in July for today for nausea, vomiting and diarrhea.  Upper GI endoscopy revealed mucosal erythema and swelling.  He was treated aggressively for reflux disease.  He recently saw his cardiologist.  He was suspected to have had a recent acute coronary event.    REVIEW OF SYSTEMS: Out of a complete 14 system review of symptoms, the patient complains only of the following symptoms, and all other reviewed systems are negative.   ALLERGIES: No Known Allergies  HOME MEDICATIONS: Outpatient Medications Prior to Visit  Medication Sig Dispense Refill  . aspirin 81 MG chewable tablet Chew 1 tablet (81 mg total) by mouth daily. 30 tablet 11  . atorvastatin (LIPITOR) 20 MG tablet TAKE 1 TABLET BY MOUTH DAILY. 90 tablet 0  . atorvastatin (LIPITOR) 20 MG tablet Take 1 tablet (20 mg total) by mouth daily. 90 tablet 3  . carvedilol (COREG) 3.125 MG tablet TAKE 1 TABLET  BY MOUTH 2 TIMES DAILY. 90 tablet 0  . carvedilol (COREG) 3.125 MG tablet TAKE 1 TABLET BY MOUTH 2 TIMES DAILY. 90 tablet 0  . carvedilol (COREG) 3.125 MG tablet TAKE 1 TABLET BY MOUTH 2 TIMES DAILY. 90 tablet 3  . Cholecalciferol (VITAMIN D) 50 MCG (2000 UT) CAPS Take 1 capsule (2,000 Units total) by mouth daily. 30 capsule 3  . CORLANOR 5 MG TABS tablet TAKE 0.5 TABLETS (2.5 MG TOTAL) BY MOUTH 2 (TWO) TIMES DAILY. 90 tablet 3  . Evolocumab (REPATHA) 140 MG/ML SOSY Inject 140 mg into the skin every 14  (fourteen) days. Med available with out PA 6 mL 0  . Evolocumab 140 MG/ML SOSY INJECT 140 MG INTO THE SKIN EVERY 14 DAYS. 6 mL 0  . Evolocumab (REPATHA SURECLICK) 270 MG/ML SOAJ inject 140mg  every 14 days 6 mL 4  . ezetimibe (ZETIA) 10 MG tablet TAKE 1 TABLET (10 MG TOTAL) BY MOUTH DAILY. 90 tablet 0  . ezetimibe (ZETIA) 10 MG tablet Take 1 tablet (10 mg total) by mouth daily. 90 tablet 3  . FLUoxetine (PROZAC) 40 MG capsule TAKE 1 CAPSULE (40 MG TOTAL) BY MOUTH DAILY. 90 capsule 1  . FLUoxetine (PROZAC) 40 MG capsule Take 1 capsule (40 mg total) by mouth daily. 90 capsule 3  . furosemide (LASIX) 40 MG tablet Take 1 tablet (40 mg total) by mouth daily. 90 tablet 3  . ibuprofen (ADVIL) 800 MG tablet Take 1 tablet (800 mg total) by mouth 3 (three) times daily. 90 tablet 2  . ivabradine (CORLANOR) 5 MG TABS tablet TAKE 1/2 TABLET (2.5 MG) BY MOUTH 2 TIMES DAILY. 90 tablet 0  . levothyroxine (SYNTHROID) 137 MCG tablet TAKE 1 TABLET BY MOUTH ONCE DAILY 30 tablet 5  . levothyroxine (SYNTHROID) 137 MCG tablet Take 1 tablet (137 mcg total) by mouth daily before breakfast. 90 tablet 3  . Multiple Vitamin (MULTIVITAMIN WITH MINERALS) TABS tablet Take 1 tablet by mouth daily. 90 tablet 3  . nitroGLYCERIN (NITROSTAT) 0.4 MG SL tablet PLACE 1 TABLET UNDER THE TONGUE EVERY 5 MINUTES AS NEEDED FOR CHEST PAIN. 25 tablet 2  . nitroGLYCERIN (NITROSTAT) 0.4 MG SL tablet Place 1 tablet (0.4 mg total) under the tongue every 5 (five) minutes as needed for chest pain. 25 tablet 2  . pantoprazole (PROTONIX) 40 MG tablet TAKE 1 TABLET (40 MG TOTAL) BY MOUTH DAILY. 90 tablet 3  . sacubitril-valsartan (ENTRESTO) 24-26 MG Take 1 tablet by mouth 2 (two) times daily. 180 tablet 3  . sildenafil (VIAGRA) 100 MG tablet Take 1 tablet (100 mg total) by mouth as needed for erectile dysfunction. TAKE 1 (ONE) TABLET AS NEEDED. DO NOT USE AT SAME TIME AS NITRO 30 tablet 3  . spironolactone (ALDACTONE) 25 MG tablet Take 1 tablet (25 mg  total) by mouth daily. Please disregard duplicate prescription of 90 pills 90 tablet 3   No facility-administered medications prior to visit.    PAST MEDICAL HISTORY: Past Medical History:  Diagnosis Date  . Acute MI anterior wall first episode care [facility]) 07/13/2017   Occluded ostial LAD S/P 3.5x26 Onyx DES. RI 80%. RCA Occluded at prior stent site with collaterals   . Cardiogenic shock ([place]) 07/13/2017  . CHF (congestive heart failure) ([place])   . Graves disease   . Hypertension   . MI (myocardial infarction) ([place])   . Thyroid disease    thyroid irradiated per pt    PAST SURGICAL HISTORY: Past Surgical History:  Procedure  Laterality Date  . CORONARY ANGIOPLASTY WITH STENT PLACEMENT    . CORONARY/GRAFT ACUTE MI REVASCULARIZATION N/A 07/13/2017   Procedure: Coronary/Graft Acute MI Revascularization;  Surgeon: Adrian Prows, MD;  Location: [place] [facility];  Service: Cardiovascular;  Laterality: N/A;  . ESOPHAGOGASTRODUODENOSCOPY N/A 06/30/2020   Procedure: ESOPHAGOGASTRODUODENOSCOPY (EGD);  Surgeon: Toledo, Benay Pike, MD;  Location: [facility] ENDOSCOPY;  Service: Gastroenterology;  Laterality: N/A;  . LEFT HEART CATH AND CORONARY ANGIOGRAPHY N/A 07/13/2017   Procedure: Left Heart Cath and Coronary Angiography;  Surgeon: Adrian Prows, MD;  Location: [place] [facility];  Service: Cardiovascular;  Laterality: N/A;  . LEFT HEART CATH AND CORONARY ANGIOGRAPHY N/A 08/21/2017   Procedure: LEFT HEART CATH AND CORONARY ANGIOGRAPHY;  Surgeon: Nigel Mormon, MD;  Location: [place] [facility];  Service: Cardiovascular;  Laterality: N/A;  . LEFT HEART CATH AND CORONARY ANGIOGRAPHY N/A 08/10/2020   Procedure: LEFT HEART CATH AND CORONARY ANGIOGRAPHY;  Surgeon: Nigel Mormon, MD;  Location: [place] [facility];  Service: Cardiovascular;  Laterality: N/A;  . SKIN GRAFT    . VENTRICULAR ASSIST DEVICE INSERTION N/A 07/13/2017   Procedure: Ventricular Assist Device Insertion;  Surgeon: Adrian Prows, MD;   Location: [place] [facility];  Service: Cardiovascular;  Laterality: N/A;    FAMILY HISTORY: Family History  Problem Relation Age of Onset  . Heart disease Father   . Hypertension Father   . Heart disease Paternal Grandmother     SOCIAL HISTORY: Social History   Socioeconomic History  . Marital status: Married    Spouse name: Not on file  . Number of children: 2  . Years of education: Not on file  . Highest education level: Not on file  Occupational History  . Not on file  Tobacco Use  . Smoking status: Former Smoker    Packs/day: 0.50    Years: 20.00    Pack years: 10.00    Types: Cigarettes    Quit date: 07/13/2017    Years since quitting: 3.7  . Smokeless tobacco: Never Used  . Tobacco comment: quit 2012  Vaping Use  . Vaping Use: Never used  Substance and Sexual Activity  . Alcohol use: Yes    Comment: occ  . Drug use: No  . Sexual activity: Yes  Other Topics Concern  . Not on file  Social History Narrative   Lives with fiance, working FT , disesel equip, right handed, 2 kids, college edu   Social Determinants of Health   Financial Resource Strain: Not on file  Food Insecurity: Not on file  Transportation Needs: Not on file  Physical Activity: Not on file  Stress: Not on file  Social Connections: Not on file  Intimate Partner Violence: Not on file     PHYSICAL EXAM  There were no vitals filed for this visit. There is no height or weight on file to calculate BMI.  Generalized: Well developed, in no acute distress  Cardiology: normal rate and rhythm, no murmur noted Respiratory: clear to auscultation bilaterally  Neurological examination  Mentation: Alert oriented to time, place, history taking. Follows all commands speech and language fluent Cranial nerve II-XII: Pupils were equal round reactive to light. Extraocular movements were full, visual field were full  Motor: The motor testing reveals 5 over 5 strength of all 4 extremities. Good symmetric  motor tone is noted throughout.  Gait and station: Gait is normal.    DIAGNOSTIC DATA (LABS, IMAGING, TESTING) - I reviewed patient records, labs, notes,  testing and imaging myself where available.  No flowsheet data found.   Lab Results  Component Value Date   WBC 6.3 08/04/2020   HGB 16.1 08/04/2020   HCT 49.4 08/04/2020   MCV 94 08/04/2020   PLT 193 08/04/2020      Component Value Date/Time   NA 140 01/06/2021 1341   K 5.1 01/06/2021 1341   CL 101 01/06/2021 1341   CO2 25 01/06/2021 1341   GLUCOSE 102 (H) 01/06/2021 1341   GLUCOSE 107 (H) 06/30/2020 0000   BUN 20 01/06/2021 1341   CREATININE 1.35 (H) 01/06/2021 1341   CALCIUM 9.8 01/06/2021 1341   PROT 6.6 08/04/2020 0804   ALBUMIN 4.6 08/04/2020 0804   AST 15 08/04/2020 0804   ALT 19 08/04/2020 0804   ALKPHOS 112 08/04/2020 0804   BILITOT 0.3 08/04/2020 0804   GFRNONAA 60 01/06/2021 1341   GFRAA 70 01/06/2021 1341   Lab Results  Component Value Date   CHOL 97 06/30/2020   HDL 32 (L) 06/30/2020   LDLCALC 46 06/30/2020   TRIG 96 06/30/2020   CHOLHDL 3.0 06/30/2020   Lab Results  Component Value Date   HGBA1C 6.1 (H) 06/30/2020   No results found for: [id number] Lab Results  Component Value Date   TSH 1.360 07/13/2017     ASSESSMENT AND PLAN 50 y.o. year old male  has a past medical history of Acute MI anterior wall first episode care ([place]) (07/13/2017), Cardiogenic shock ([place]) (07/13/2017), CHF (congestive heart failure) ([place]), Graves disease, Hypertension, MI (myocardial infarction) ([place]), and Thyroid disease. here with ***  No diagnosis found.   Edie Darley is doing well on CPAP therapy. Compliance report reveals ***. *** was encouraged to continue using CPAP nightly and for greater than 4 hours each night. We will update supply orders as indicated. Risks of untreated sleep apnea review and education materials provided. Healthy lifestyle habits encouraged. *** will follow up in ***, sooner if needed. ***  verbalizes understanding and agreement with this plan.   No orders of the defined types were placed in this encounter.    No orders of the defined types were placed in this encounter.     I spent 15 minutes with the patient. 50% of this time was spent counseling and educating patient on plan of care and medications.    Debbora Presto, FNP-C 04/13/2021, 8:19 AM [facility] [address], [place], [place] [address] [phone]

## 2021-04-20 ENCOUNTER — Other Ambulatory Visit: Payer: Self-pay

## 2021-04-20 ENCOUNTER — Ambulatory Visit: Payer: BC Managed Care – PPO | Admitting: Cardiology

## 2021-04-20 ENCOUNTER — Ambulatory Visit: Payer: 59 | Admitting: Cardiology

## 2021-04-20 ENCOUNTER — Encounter: Payer: Self-pay | Admitting: Cardiology

## 2021-04-20 VITALS — BP 110/71 | HR 88 | Temp 97.8°F | Resp 16 | Ht 73.0 in | Wt 226.0 lb

## 2021-04-20 DIAGNOSIS — I2510 Atherosclerotic heart disease of native coronary artery without angina pectoris: Secondary | ICD-10-CM | POA: Diagnosis not present

## 2021-04-20 DIAGNOSIS — I5020 Unspecified systolic (congestive) heart failure: Secondary | ICD-10-CM

## 2021-04-20 DIAGNOSIS — I255 Ischemic cardiomyopathy: Secondary | ICD-10-CM

## 2021-04-20 MED ORDER — IVABRADINE HCL 5 MG PO TABS
5.0000 mg | ORAL_TABLET | Freq: Two times a day (BID) | ORAL | 1 refills | Status: DC
Start: 2021-04-20 — End: 2021-12-20
  Filled 2021-04-20 – 2021-06-21 (×2): qty 180, 90d supply, fill #0
  Filled 2021-09-26: qty 180, 90d supply, fill #1

## 2021-04-20 MED ORDER — TORSEMIDE 20 MG PO TABS
20.0000 mg | ORAL_TABLET | Freq: Two times a day (BID) | ORAL | 3 refills | Status: DC
  Filled 2021-04-20: qty 60, 30d supply, fill #0

## 2021-04-20 NOTE — Progress Notes (Signed)
Patient is here for follow up visit.  Subjective:   Bobby [name], male    DOB: 28-Jan-1971, 50 y.o.   MRN: [id number]   Chief Complaint  Patient presents with  . Shortness of Breath  . Follow-up    HPI   50 year old Caucasian male with coronary artery disease status post anterior wall MI 06/2017 s/p successful LAD revascularization, prior RCA stent now with CTO, EF 43% on MRI in 10/2017 with no reversible ischemia.  In last few days, patient has had worsening dyspnea, bendopnea symptoms. He also has tingling in hands and feet, occasional chest pain symptoms. Weight is stable. He has severe fatigue thereby limiting his quality of life.   Current Outpatient Medications on File Prior to Visit  Medication Sig Dispense Refill  . aspirin 81 MG chewable tablet Chew 1 tablet (81 mg total) by mouth daily. 30 tablet 11  . atorvastatin (LIPITOR) 20 MG tablet TAKE 1 TABLET BY MOUTH DAILY. 90 tablet 0  . Evolocumab (REPATHA SURECLICK) 734 MG/ML SOAJ inject 129m every 14 days 6 mL 4  . ezetimibe (ZETIA) 10 MG tablet TAKE 1 TABLET (10 MG TOTAL) BY MOUTH DAILY. 90 tablet 0  . FLUoxetine (PROZAC) 40 MG capsule TAKE 1 CAPSULE (40 MG TOTAL) BY MOUTH DAILY. 90 capsule 1  . furosemide (LASIX) 40 MG tablet Take 1 tablet (40 mg total) by mouth daily. 90 tablet 3  . levothyroxine (SYNTHROID) 137 MCG tablet TAKE 1 TABLET BY MOUTH ONCE DAILY 30 tablet 5  . Multiple Vitamin (MULTIVITAMIN WITH MINERALS) TABS tablet Take 1 tablet by mouth daily. 90 tablet 3  . nitroGLYCERIN (NITROSTAT) 0.4 MG SL tablet PLACE 1 TABLET UNDER THE TONGUE EVERY 5 MINUTES AS NEEDED FOR CHEST PAIN. 25 tablet 2  . pantoprazole (PROTONIX) 40 MG tablet TAKE 1 TABLET (40 MG TOTAL) BY MOUTH DAILY. 90 tablet 3  . sacubitril-valsartan (ENTRESTO) 24-26 MG Take 1 tablet by mouth 2 (two) times daily. 180 tablet 3  . sildenafil (VIAGRA) 100 MG tablet Take 1 tablet (100 mg total) by mouth as needed for erectile dysfunction. TAKE 1 (ONE) TABLET  AS NEEDED. DO NOT USE AT SAME TIME AS NITRO 30 tablet 3  . spironolactone (ALDACTONE) 25 MG tablet Take 1 tablet (25 mg total) by mouth daily. Please disregard duplicate prescription of 90 pills 90 tablet 3  . carvedilol (COREG) 3.125 MG tablet TAKE 1 TABLET BY MOUTH 2 TIMES DAILY. 90 tablet 0  . ivabradine (CORLANOR) 5 MG TABS tablet TAKE 1/2 TABLET (2.5 MG) BY MOUTH 2 TIMES DAILY. 90 tablet 0   No current facility-administered medications on file prior to visit.    Cardiovascular studies:  EKG 01/14/2021: Sinus rhythm 63 bpm  Old anteroseptal infarct Poor R wave progression  Coronary angiography 08/10/2020: LM: Normal LAD: Patent ostial LAD stent. 20% ostial diagonal stenosis LCx: 20% OM1 stenosis RCA: Prox in-stent CTO. Left-to-left collaterals fill up to mid RCA Normal LVEDP  Echocardiogram 07/16/2020:  Poor visualization of endocardial borders. Left ventricle cavity is  normal in size and thickness. Anterior/anteroseptal akinesis. LVEF  probably 40-45%. Indeterminate diastolic filling pattern.  No significant valvular abnormalities.  normal right atrial pressure.  No significant change compared to previous study in 2018.  Real-time Outpatient Cardiac Telemetry 04/01/2020 - 04/15/2020: Dominant rhythm: Sinus. HR 44-150 bpm. Avg HR 72 bpm. Supraventricular ectopy, PAC/upto 6 beat run. SVE burden <1% Ventricular ectopy, PVC, NSVT upto 5 beats. VE burden <1% No atrial fibrillation/atrial flutter/SVT/high grade AV block, sinus pause >  3sec noted. 12 patient activated events do not correlate with arrhthymias.   Stress perfusion MRI 11/07/2017: 1. Moderately dilated left ventricle with normal wall thickness and moderately decreased systolic function (LVEF = 43%). There is hypokinesis of the basal anteroseptal, anterior walls and akinesis of the mid inferoseptal, anteroseptal, anterior, all apical walls including true apex. A large infarct in the basal and mid inferior,  inferoseptal, anteroseptal, anterior walls and in all the apical walls with no ischemia. 2. Normal right ventricular size, thickness and systolic function (LVEF = 56%). There are no regional wall motion abnormalities. 3.  Mildly dilated left atrium. 4. Mild mitral and tricuspid regurgitation. Collectively, these findings are consistent with an ischemic cardiomyopathy with a large infarct in the LAD and RCA territory with no peri-infarct ischemia. There is poor chance of recovery if Revascularized.  Cath 08/21/2017: LM: Normal LAD: Patent ostial LAD stent. 20% ostial diagonal stenosis Ramus: 50% proximal stenosis RCA: Not engaged today. Known RCA CTO   Elevated LVEDP LVEF 45%   Continue aggressive medical therapy  Coronary angiogram 07/13/2017: Elevated LVEDP at 24 mmHg, systemic pressures 80/60 mmHg, patient in cardiogenic shock. Left main normal, LAD flush occluded. Ramus intermediate mid 70-80% % stenosis, mild disease in the circumflex. RCA occluded at the prior angioplasty site. Contralateral collaterals from the LAD.    Large LAD and diagonal, successful angioplasty and stenting following thrombectomy with implantation of 3.5 x 26 mm resolute Onyx DES deployed at 14 atmospheric pressure. Diagonal 1 ostial stenosis balloon angioplasty performed with 2.5 mm balloon at 10 atmospheric pressure. Stenosis reduced to 0%, mid flow improved from 0-3.  Carotid artery duplex 10/05/2017: No hemodynamically significant arterial disease in the internal carotid artery bilaterally. Normal study.  Antegrade right vertebral artery flow. Antegrade left vertebral artery flow.  Recent labs: 01/06/2021: Glucose 102, BUN/Cr 20/1.35. EGFR 60. Na/K 140/5.1.   11/25/2020: Glucose 104, BUN/Cr 19/1.31. EGFR 63. Na/K 141/5.1.   11/16/2020: Glucose 129, BUN/Cr 13/1.09. EGFR 79. Na/K 140/4.3. Rest of the CMP normal H/H 14/41. MCV 91. Platelets 176 HbA1C 6.1 % Chol 92, TG 77, HDL 61, LDL 17 TSH 0.8  normal  Review of Systems  Constitutional: Negative for malaise/fatigue.  Cardiovascular: Positive for dyspnea on exertion. Negative for chest pain, leg swelling, palpitations and syncope.  Gastrointestinal: Positive for bloating.       Objective:    Vitals:   04/20/21 0928  BP: 110/71  Pulse: 88  Resp: 16  Temp: 97.8 F (36.6 C)  SpO2: 98%     Physical Exam Vitals and nursing note reviewed.  Constitutional:      Appearance: He is well-developed.  Neck:     Vascular: No JVD.  Cardiovascular:     Rate and Rhythm: Normal rate and regular rhythm.     Pulses: Intact distal pulses.     Heart sounds: Normal heart sounds. No murmur heard.   Pulmonary:     Effort: Pulmonary effort is normal.     Breath sounds: Normal breath sounds. No wheezing or rales.  Abdominal:     Comments: Generalized abdominal tenderness  Musculoskeletal:     Right lower leg: No edema.     Left lower leg: No edema.         Assessment & Recommendations:   50 year old Caucasian male with coronary artery disease status post anterior wall MI 06/2017 s/p successful LAD revascularization, prior RCA stent now with CTO, EF 43% on MRI in 10/2017 with no reversible ischemia.  Ischemic cardiomyopathy: HFrEF, stable  Continue spironolactone 25 mg daily, carvedilol 3.125 mg twice daily, Entresto 24-26 mg twice daily,  Change lasix 40 mg bid to torsemide 40 mg bid for 5 days Increase ivabradine 5 mg twice daily.  CAD: Unchanged coronary anatomy (Cath 2021). Known RCA CTO with no viability in RCA territory on MRI. Continue aspirin 81, Lipitor 20, bisoprolol, Repatha.  BMP, pro BNP, followed by 1 week f/u If no improvement, will consider weaning off beta blocker if that would be a cause for his fatigue.  Nigel Mormon, MD [facility] Pager: [phone] Office: [phone] If no answer Cell [phone]

## 2021-04-25 DIAGNOSIS — I5020 Unspecified systolic (congestive) heart failure: Secondary | ICD-10-CM | POA: Diagnosis not present

## 2021-04-26 LAB — BASIC METABOLIC PANEL
BUN/Creatinine Ratio: 9 (ref 9–20)
BUN: 13 mg/dL (ref 6–24)
CO2: 25 mmol/L (ref 20–29)
Calcium: 9.8 mg/dL (ref 8.7–10.2)
Chloride: 99 mmol/L (ref 96–106)
Creatinine, Ser: 1.47 mg/dL — ABNORMAL HIGH (ref 0.76–1.27)
Glucose: 113 mg/dL — ABNORMAL HIGH (ref 65–99)
Potassium: 4.2 mmol/L (ref 3.5–5.2)
Sodium: 141 mmol/L (ref 134–144)
eGFR: 57 mL/min/{1.73_m2} — ABNORMAL LOW (ref >59)

## 2021-04-26 LAB — PRO B NATRIURETIC PEPTIDE: NT-Pro BNP: 347 pg/mL — ABNORMAL HIGH (ref 0–121)

## 2021-04-27 ENCOUNTER — Ambulatory Visit: Payer: 59 | Admitting: Cardiology

## 2021-05-01 NOTE — Progress Notes (Signed)
Needs f/u sometime in 1-2 weeks to follow up on symptoms and labs.  Thanks MJP

## 2021-05-05 ENCOUNTER — Other Ambulatory Visit: Payer: Self-pay

## 2021-05-05 ENCOUNTER — Other Ambulatory Visit: Payer: Self-pay | Admitting: Cardiology

## 2021-05-06 ENCOUNTER — Other Ambulatory Visit: Payer: Self-pay

## 2021-05-06 DIAGNOSIS — I255 Ischemic cardiomyopathy: Secondary | ICD-10-CM

## 2021-05-06 MED ORDER — CARVEDILOL 3.125 MG PO TABS
3.1250 mg | ORAL_TABLET | Freq: Two times a day (BID) | ORAL | 3 refills | Status: DC
  Filled 2021-05-06: qty 180, 90d supply, fill #0
  Filled 2021-07-27: qty 180, 90d supply, fill #1
  Filled 2021-10-31: qty 180, 90d supply, fill #2
  Filled 2021-12-20 – 2022-02-01 (×3): qty 180, 90d supply, fill #3

## 2021-05-13 ENCOUNTER — Ambulatory Visit: Payer: BC Managed Care – PPO | Admitting: Cardiology

## 2021-05-16 ENCOUNTER — Ambulatory Visit: Payer: 59 | Admitting: Cardiology

## 2021-05-16 ENCOUNTER — Other Ambulatory Visit: Payer: Self-pay

## 2021-05-16 ENCOUNTER — Encounter: Payer: Self-pay | Admitting: Cardiology

## 2021-05-16 VITALS — BP 101/64 | HR 68 | Temp 98.0°F | Resp 16 | Ht 72.0 in | Wt 228.0 lb

## 2021-05-16 DIAGNOSIS — I255 Ischemic cardiomyopathy: Secondary | ICD-10-CM | POA: Diagnosis not present

## 2021-05-16 DIAGNOSIS — I5020 Unspecified systolic (congestive) heart failure: Secondary | ICD-10-CM

## 2021-05-16 MED ORDER — DAPAGLIFLOZIN PROPANEDIOL 10 MG PO TABS
10.0000 mg | ORAL_TABLET | Freq: Every day | ORAL | 2 refills | Status: DC
  Filled 2021-05-16: qty 30, 30d supply, fill #0
  Filled 2021-05-31: qty 90, 90d supply, fill #0

## 2021-05-16 MED ORDER — TORSEMIDE 20 MG PO TABS: 20.0000 mg | ORAL_TABLET | ORAL | 0 refills | Status: DC | PRN

## 2021-05-16 NOTE — Progress Notes (Signed)
Patient is here for follow up visit.  Subjective:   Bobby [name], male    DOB: 10-18-1971, 50 y.o.   MRN: [id number]   Chief Complaint  Patient presents with  . HFrEF  . Coronary Artery Disease  . Follow-up  . Results    labs    HPI   50 year old Caucasian male with coronary artery disease status post anterior wall MI 06/2017 s/p successful LAD revascularization, prior RCA stent now with CTO, EF 43% on MRI in 10/2017 with no reversible ischemia.  Patient has had improvement in his dyspnea and abdominal distension symptoms since switching from furosemide to torsemide. He is currently on torsemide 20 mg daily.    Current Outpatient Medications on File Prior to Visit  Medication Sig Dispense Refill  . aspirin 81 MG chewable tablet Chew 1 tablet (81 mg total) by mouth daily. 30 tablet 11  . atorvastatin (LIPITOR) 20 MG tablet TAKE 1 TABLET BY MOUTH DAILY. 90 tablet 0  . carvedilol (COREG) 3.125 MG tablet Take 1 tablet (3.125 mg total) by mouth 2 (two) times daily. 180 tablet 3  . Evolocumab (REPATHA SURECLICK) 283 MG/ML SOAJ inject 125m every 14 days 6 mL 4  . ezetimibe (ZETIA) 10 MG tablet TAKE 1 TABLET (10 MG TOTAL) BY MOUTH DAILY. 90 tablet 0  . FLUoxetine (PROZAC) 40 MG capsule TAKE 1 CAPSULE (40 MG TOTAL) BY MOUTH DAILY. 90 capsule 1  . ivabradine (CORLANOR) 5 MG TABS tablet Take 1 tablet (5 mg total) by mouth 2 (two) times daily with a meal. 180 tablet 1  . levothyroxine (SYNTHROID) 137 MCG tablet TAKE 1 TABLET BY MOUTH ONCE DAILY 30 tablet 5  . nitroGLYCERIN (NITROSTAT) 0.4 MG SL tablet PLACE 1 TABLET UNDER THE TONGUE EVERY 5 MINUTES AS NEEDED FOR CHEST PAIN. 25 tablet 2  . pantoprazole (PROTONIX) 40 MG tablet TAKE 1 TABLET (40 MG TOTAL) BY MOUTH DAILY. 90 tablet 3  . sacubitril-valsartan (ENTRESTO) 24-26 MG Take 1 tablet by mouth 2 (two) times daily. 180 tablet 3  . sildenafil (VIAGRA) 100 MG tablet Take 1 tablet (100 mg total) by mouth as needed for erectile dysfunction.  TAKE 1 (ONE) TABLET AS NEEDED. DO NOT USE AT SAME TIME AS NITRO 30 tablet 3  . spironolactone (ALDACTONE) 25 MG tablet Take 1 tablet (25 mg total) by mouth daily. Please disregard duplicate prescription of 90 pills 90 tablet 3  . torsemide (DEMADEX) 20 MG tablet Take 1 tablet (20 mg total) by mouth 2 (two) times daily. 60 tablet 3   No current facility-administered medications on file prior to visit.    Cardiovascular studies:  EKG 01/14/2021: Sinus rhythm 63 bpm  Old anteroseptal infarct Poor R wave progression  Coronary angiography 08/10/2020: LM: Normal LAD: Patent ostial LAD stent. 20% ostial diagonal stenosis LCx: 20% OM1 stenosis RCA: Prox in-stent CTO. Left-to-left collaterals fill up to mid RCA Normal LVEDP  Echocardiogram 07/16/2020:  Poor visualization of endocardial borders. Left ventricle cavity is  normal in size and thickness. Anterior/anteroseptal akinesis. LVEF  probably 40-45%. Indeterminate diastolic filling pattern.  No significant valvular abnormalities.  normal right atrial pressure.  No significant change compared to previous study in 2018.  Real-time Outpatient Cardiac Telemetry 04/01/2020 - 04/15/2020: Dominant rhythm: Sinus. HR 44-150 bpm. Avg HR 72 bpm. Supraventricular ectopy, PAC/upto 6 beat run. SVE burden <1% Ventricular ectopy, PVC, NSVT upto 5 beats. VE burden <1% No atrial fibrillation/atrial flutter/SVT/high grade AV block, sinus pause >3sec noted. 12 patient activated events  do not correlate with arrhthymias.   Stress perfusion MRI 11/07/2017: 1. Moderately dilated left ventricle with normal wall thickness and moderately decreased systolic function (LVEF = 43%). There is hypokinesis of the basal anteroseptal, anterior walls and akinesis of the mid inferoseptal, anteroseptal, anterior, all apical walls including true apex. A large infarct in the basal and mid inferior, inferoseptal, anteroseptal, anterior walls and in all the apical walls  with no ischemia. 2. Normal right ventricular size, thickness and systolic function (LVEF = 56%). There are no regional wall motion abnormalities. 3.  Mildly dilated left atrium. 4. Mild mitral and tricuspid regurgitation. Collectively, these findings are consistent with an ischemic cardiomyopathy with a large infarct in the LAD and RCA territory with no peri-infarct ischemia. There is poor chance of recovery if Revascularized.  Cath 08/21/2017: LM: Normal LAD: Patent ostial LAD stent. 20% ostial diagonal stenosis Ramus: 50% proximal stenosis RCA: Not engaged today. Known RCA CTO   Elevated LVEDP LVEF 45%   Continue aggressive medical therapy  Coronary angiogram 07/13/2017: Elevated LVEDP at 24 mmHg, systemic pressures 80/60 mmHg, patient in cardiogenic shock. Left main normal, LAD flush occluded. Ramus intermediate mid 70-80% % stenosis, mild disease in the circumflex. RCA occluded at the prior angioplasty site. Contralateral collaterals from the LAD.    Large LAD and diagonal, successful angioplasty and stenting following thrombectomy with implantation of 3.5 x 26 mm resolute Onyx DES deployed at 14 atmospheric pressure. Diagonal 1 ostial stenosis balloon angioplasty performed with 2.5 mm balloon at 10 atmospheric pressure. Stenosis reduced to 0%, mid flow improved from 0-3.  Carotid artery duplex 10/05/2017: No hemodynamically significant arterial disease in the internal carotid artery bilaterally. Normal study.  Antegrade right vertebral artery flow. Antegrade left vertebral artery flow.  Recent labs: 04/25/2021: Glucose 113, BUN/Cr 13/1.47. EGFR 57. Na/K 141/4.2.  NT pro BNP 347  01/06/2021: Glucose 102, BUN/Cr 20/1.35. EGFR 60. Na/K 140/5.1.   11/25/2020: Glucose 104, BUN/Cr 19/1.31. EGFR 63. Na/K 141/5.1.   11/16/2020: Glucose 129, BUN/Cr 13/1.09. EGFR 79. Na/K 140/4.3. Rest of the CMP normal H/H 14/41. MCV 91. Platelets 176 HbA1C 6.1 % Chol 92, TG 77, HDL 61, LDL  17 TSH 0.8 normal  Review of Systems  Constitutional: Negative for malaise/fatigue.  Cardiovascular: Positive for dyspnea on exertion. Negative for chest pain, leg swelling, palpitations and syncope.  Gastrointestinal: Positive for bloating.       Objective:    Vitals:   05/16/21 1135  BP: 101/64  Pulse: 68  Resp: 16  Temp: 98 F (36.7 C)  SpO2: 98%     Physical Exam Vitals and nursing note reviewed.  Constitutional:      Appearance: He is well-developed.  Neck:     Vascular: No JVD.  Cardiovascular:     Rate and Rhythm: Normal rate and regular rhythm.     Pulses: Intact distal pulses.     Heart sounds: Normal heart sounds. No murmur heard.   Pulmonary:     Effort: Pulmonary effort is normal.     Breath sounds: Normal breath sounds. No wheezing or rales.  Abdominal:     Comments: Generalized abdominal tenderness  Musculoskeletal:     Right lower leg: No edema.     Left lower leg: No edema.         Assessment & Recommendations:   50 year old Caucasian male with coronary artery disease status post anterior wall MI 06/2017 s/p successful LAD revascularization, prior RCA stent now with CTO, EF 43% on MRI in 10/2017  with no reversible ischemia.  Ischemic cardiomyopathy: HFrEF, stable Continue spironolactone 25 mg daily, carvedilol 3.125 mg twice daily, Entresto 24-26 mg twice daily, ivabradine 5 mg twice daily. Added Farxiga 10 mg daily. May reduce torsemide to 20 mg as needed  CAD: Unchanged coronary anatomy (Cath 2021). Known RCA CTO with no viability in RCA territory on MRI. Continue aspirin 81, Lipitor 20, bisoprolol, Repatha.  F/u in 4 weeks  [place], MD [employer] [facility] Pager: [phone] Office: [phone] If no answer Cell [phone]

## 2021-05-30 ENCOUNTER — Other Ambulatory Visit: Payer: Self-pay

## 2021-05-31 ENCOUNTER — Other Ambulatory Visit: Payer: Self-pay

## 2021-06-21 ENCOUNTER — Other Ambulatory Visit: Payer: Self-pay

## 2021-06-22 ENCOUNTER — Other Ambulatory Visit: Payer: Self-pay

## 2021-07-11 MED FILL — Levothyroxine Sodium Tab 137 MCG: ORAL | 30 days supply | Qty: 30 | Fill #0 | Status: AC

## 2021-07-12 ENCOUNTER — Other Ambulatory Visit: Payer: Self-pay

## 2021-07-13 ENCOUNTER — Telehealth: Payer: Self-pay

## 2021-07-13 ENCOUNTER — Other Ambulatory Visit: Payer: Self-pay

## 2021-07-14 ENCOUNTER — Other Ambulatory Visit: Payer: Self-pay | Admitting: Cardiology

## 2021-07-14 ENCOUNTER — Other Ambulatory Visit: Payer: Self-pay

## 2021-07-14 DIAGNOSIS — I5020 Unspecified systolic (congestive) heart failure: Secondary | ICD-10-CM

## 2021-07-14 MED ORDER — TORSEMIDE 20 MG PO TABS
20.0000 mg | ORAL_TABLET | ORAL | 0 refills | Status: DC | PRN
  Filled 2021-07-14: qty 1, 1d supply, fill #0

## 2021-07-15 ENCOUNTER — Ambulatory Visit: Payer: 59 | Admitting: Cardiology

## 2021-07-18 ENCOUNTER — Other Ambulatory Visit: Payer: Self-pay

## 2021-07-18 DIAGNOSIS — I5020 Unspecified systolic (congestive) heart failure: Secondary | ICD-10-CM

## 2021-07-18 MED ORDER — TORSEMIDE 20 MG PO TABS: 20.0000 mg | ORAL_TABLET | ORAL | 0 refills | Status: DC | PRN

## 2021-07-24 ENCOUNTER — Other Ambulatory Visit: Payer: Self-pay | Admitting: Cardiology

## 2021-07-25 ENCOUNTER — Other Ambulatory Visit: Payer: Self-pay

## 2021-07-25 MED ORDER — EZETIMIBE 10 MG PO TABS
ORAL_TABLET | Freq: Every day | ORAL | 0 refills | Status: DC
  Filled 2021-07-25: qty 90, 90d supply, fill #0

## 2021-07-26 ENCOUNTER — Other Ambulatory Visit: Payer: Self-pay

## 2021-07-28 ENCOUNTER — Other Ambulatory Visit: Payer: Self-pay

## 2021-08-08 ENCOUNTER — Other Ambulatory Visit: Payer: Self-pay

## 2021-08-08 NOTE — Telephone Encounter (Signed)
From patient.

## 2021-08-09 ENCOUNTER — Other Ambulatory Visit: Payer: Self-pay

## 2021-08-17 ENCOUNTER — Other Ambulatory Visit: Payer: Self-pay

## 2021-08-17 MED FILL — Levothyroxine Sodium Tab 137 MCG: ORAL | 30 days supply | Qty: 30 | Fill #1 | Status: AC

## 2021-08-24 ENCOUNTER — Other Ambulatory Visit: Payer: Self-pay | Admitting: Cardiology

## 2021-08-24 ENCOUNTER — Other Ambulatory Visit: Payer: Self-pay

## 2021-08-24 DIAGNOSIS — I5020 Unspecified systolic (congestive) heart failure: Secondary | ICD-10-CM

## 2021-08-24 MED ORDER — DAPAGLIFLOZIN PROPANEDIOL 10 MG PO TABS
10.0000 mg | ORAL_TABLET | Freq: Every day | ORAL | 2 refills | Status: DC
  Filled 2021-08-24: qty 90, 90d supply, fill #0
  Filled 2021-11-27: qty 90, 90d supply, fill #1
  Filled 2022-02-25: qty 90, 90d supply, fill #2

## 2021-08-24 MED FILL — Fluoxetine HCl Cap 40 MG: ORAL | 90 days supply | Qty: 90 | Fill #0 | Status: AC

## 2021-08-27 DIAGNOSIS — Z20822 Contact with and (suspected) exposure to covid-19: Secondary | ICD-10-CM | POA: Diagnosis not present

## 2021-08-27 DIAGNOSIS — Z03818 Encounter for observation for suspected exposure to other biological agents ruled out: Secondary | ICD-10-CM | POA: Diagnosis not present

## 2021-08-30 ENCOUNTER — Other Ambulatory Visit: Payer: Self-pay

## 2021-08-30 NOTE — Telephone Encounter (Signed)
From pt

## 2021-08-31 NOTE — Telephone Encounter (Signed)
From pt

## 2021-09-10 ENCOUNTER — Other Ambulatory Visit: Payer: Self-pay | Admitting: Cardiology

## 2021-09-12 ENCOUNTER — Other Ambulatory Visit: Payer: Self-pay

## 2021-09-12 MED ORDER — ATORVASTATIN CALCIUM 20 MG PO TABS
ORAL_TABLET | Freq: Every day | ORAL | 0 refills | Status: DC
  Filled 2021-09-12: qty 90, 90d supply, fill #0

## 2021-09-12 MED FILL — Levothyroxine Sodium Tab 137 MCG: ORAL | 30 days supply | Qty: 30 | Fill #2 | Status: AC

## 2021-09-27 ENCOUNTER — Other Ambulatory Visit: Payer: Self-pay

## 2021-10-10 ENCOUNTER — Other Ambulatory Visit: Payer: Self-pay | Admitting: Cardiology

## 2021-10-11 ENCOUNTER — Other Ambulatory Visit: Payer: Self-pay

## 2021-10-11 MED ORDER — EZETIMIBE 10 MG PO TABS
ORAL_TABLET | Freq: Every day | ORAL | 0 refills | Status: DC
  Filled 2021-10-11: qty 90, 90d supply, fill #0

## 2021-10-12 MED FILL — Levothyroxine Sodium Tab 137 MCG: ORAL | 30 days supply | Qty: 30 | Fill #3 | Status: AC

## 2021-10-13 ENCOUNTER — Other Ambulatory Visit: Payer: Self-pay

## 2021-10-25 ENCOUNTER — Other Ambulatory Visit: Payer: Self-pay

## 2021-10-31 ENCOUNTER — Other Ambulatory Visit: Payer: Self-pay

## 2021-10-31 MED FILL — Nitroglycerin SL Tab 0.4 MG: SUBLINGUAL | 8 days supply | Qty: 25 | Fill #0 | Status: AC

## 2021-11-01 ENCOUNTER — Other Ambulatory Visit: Payer: Self-pay

## 2021-11-14 ENCOUNTER — Other Ambulatory Visit: Payer: Self-pay

## 2021-11-14 DIAGNOSIS — Z760 Encounter for issue of repeat prescription: Secondary | ICD-10-CM | POA: Diagnosis not present

## 2021-11-14 MED FILL — Levothyroxine Sodium Tab 137 MCG: ORAL | 30 days supply | Qty: 30 | Fill #4 | Status: AC

## 2021-11-23 ENCOUNTER — Other Ambulatory Visit: Payer: Self-pay

## 2021-11-23 DIAGNOSIS — I5042 Chronic combined systolic (congestive) and diastolic (congestive) heart failure: Secondary | ICD-10-CM | POA: Diagnosis not present

## 2021-11-23 DIAGNOSIS — K219 Gastro-esophageal reflux disease without esophagitis: Secondary | ICD-10-CM | POA: Diagnosis not present

## 2021-11-23 DIAGNOSIS — E785 Hyperlipidemia, unspecified: Secondary | ICD-10-CM | POA: Diagnosis not present

## 2021-11-23 DIAGNOSIS — J0140 Acute pansinusitis, unspecified: Secondary | ICD-10-CM | POA: Diagnosis not present

## 2021-11-23 DIAGNOSIS — E039 Hypothyroidism, unspecified: Secondary | ICD-10-CM | POA: Diagnosis not present

## 2021-11-23 DIAGNOSIS — R739 Hyperglycemia, unspecified: Secondary | ICD-10-CM | POA: Diagnosis not present

## 2021-11-23 DIAGNOSIS — Z23 Encounter for immunization: Secondary | ICD-10-CM | POA: Diagnosis not present

## 2021-11-23 DIAGNOSIS — F3341 Major depressive disorder, recurrent, in partial remission: Secondary | ICD-10-CM | POA: Diagnosis not present

## 2021-11-23 DIAGNOSIS — I110 Hypertensive heart disease with heart failure: Secondary | ICD-10-CM | POA: Diagnosis not present

## 2021-11-23 MED ORDER — DULOXETINE HCL 20 MG PO CPEP
ORAL_CAPSULE | ORAL | 3 refills | Status: DC
  Filled 2021-11-23: qty 90, 90d supply, fill #0

## 2021-11-23 MED ORDER — PANTOPRAZOLE SODIUM 40 MG PO TBEC
DELAYED_RELEASE_TABLET | ORAL | 3 refills | Status: DC
  Filled 2021-11-23 – 2022-04-08 (×2): qty 90, 90d supply, fill #0

## 2021-11-23 MED ORDER — LEVOTHYROXINE SODIUM 137 MCG PO TABS
ORAL_TABLET | ORAL | 5 refills | Status: DC
  Filled 2021-11-23 – 2021-12-13 (×2): qty 30, 30d supply, fill #0
  Filled 2022-01-11: qty 30, 30d supply, fill #1
  Filled 2022-02-10: qty 30, 30d supply, fill #2
  Filled 2022-03-15: qty 30, 30d supply, fill #3
  Filled 2022-04-12: qty 30, 30d supply, fill #4
  Filled 2022-05-14: qty 30, 30d supply, fill #5

## 2021-11-23 MED ORDER — AMOXICILLIN-POT CLAVULANATE 875-125 MG PO TABS
ORAL_TABLET | ORAL | 0 refills | Status: DC
  Filled 2021-11-23: qty 28, 14d supply, fill #0

## 2021-11-25 ENCOUNTER — Ambulatory Visit (INDEPENDENT_AMBULATORY_CARE_PROVIDER_SITE_OTHER): Payer: BC Managed Care – PPO

## 2021-11-25 ENCOUNTER — Other Ambulatory Visit: Payer: Self-pay

## 2021-11-25 ENCOUNTER — Ambulatory Visit
Admission: EM | Admit: 2021-11-25 | Discharge: 2021-11-25 | Disposition: A | Discharge status: Home / Self Care | Payer: BC Managed Care – PPO | Attending: Emergency Medicine | Admitting: Emergency Medicine

## 2021-11-25 DIAGNOSIS — N200 Calculus of kidney: Secondary | ICD-10-CM | POA: Insufficient documentation

## 2021-11-25 DIAGNOSIS — R1111 Vomiting without nausea: Secondary | ICD-10-CM | POA: Diagnosis not present

## 2021-11-25 DIAGNOSIS — R1031 Right lower quadrant pain: Secondary | ICD-10-CM | POA: Diagnosis not present

## 2021-11-25 LAB — URINALYSIS, COMPLETE (UACMP) WITH MICROSCOPIC
Bacteria, UA: NONE SEEN
Bilirubin Urine: NEGATIVE
Glucose, UA: >1000 mg/dL — AB
Hgb urine dipstick: NEGATIVE
Ketones, ur: NEGATIVE mg/dL
Leukocytes,Ua: NEGATIVE
Nitrite: NEGATIVE
Protein, ur: NEGATIVE mg/dL
Specific Gravity, Urine: 1.01 (ref 1.005–1.030)
Squamous Epithelial / HPF: NONE SEEN (ref 0–5)
pH: 6.5 (ref 5.0–8.0)

## 2021-11-25 LAB — GLUCOSE, CAPILLARY: Glucose-Capillary: 130 mg/dL — ABNORMAL HIGH (ref 70–99)

## 2021-11-25 MED ORDER — TAMSULOSIN HCL 0.4 MG PO CAPS
0.8000 mg | ORAL_CAPSULE | Freq: Every day | ORAL | 0 refills | Status: DC
  Filled 2021-11-25: qty 30, 15d supply, fill #0

## 2021-11-25 MED ORDER — TRAMADOL HCL 50 MG PO TABS
50.0000 mg | ORAL_TABLET | Freq: Four times a day (QID) | ORAL | 0 refills | Status: DC | PRN
  Filled 2021-11-25: qty 15, 4d supply, fill #0

## 2021-11-25 NOTE — ED Provider Notes (Signed)
[facility]    CSN: [id number] Arrival date & time: 11/25/21  0815      History   Chief Complaint Chief Complaint  Patient presents with   Abdominal Pain    HPI Bobby [name] is a 50 y.o. male.   Pt has been tx with abx for URI from pcp since wed. Pt states that he has been coughing and some emesis then began to have RLQ abd pain. Pt has had a normal BM this am. Denies in bloody or dark stools. Not eating much. Pt is a heart failure pt and is concerned. Pt also received shingles and flu shot on wed and wanted to know if this would cause some of the pain.    Past Medical History:  Diagnosis Date   Acute MI anterior wall first episode care ([place]) 07/13/2017   Occluded ostial LAD S/P 3.5x26 Onyx DES. RI 80%. RCA Occluded at prior stent site with collaterals    Cardiogenic shock ([place]) 07/13/2017   CHF (congestive heart failure) ([place])    Graves disease    Hypertension    MI (myocardial infarction) ([place])    Thyroid disease    thyroid irradiated per pt    Patient Active Problem List   Diagnosis Date Noted   HFrEF (heart failure with reduced ejection fraction) ([place]) 11/22/2020   Nausea vomiting and diarrhea 06/29/2020   Abdominal pain 06/29/2020   Elevated troponin 06/29/2020   HLD (hyperlipidemia) 06/29/2020   GERD (gastroesophageal reflux disease) 06/29/2020   Hypothyroidism 06/29/2020   CAD (coronary artery disease) 06/29/2020   Myalgia 05/21/2019   CHF (congestive heart failure), NYHA class II, chronic, combined ([place]) 02/20/2019   Ischemic cardiomyopathy 02/20/2019   Bruit of right carotid artery 02/20/2019   Laboratory examination 02/20/2019   Post PTCA 07/13/2017   Unspecified hypothyroidism 08/27/2013   Tachycardia 03/15/2012   Coronary artery disease involving native coronary artery of native heart without angina pectoris 03/15/2012   HTN (hypertension) 03/15/2012   Depression with anxiety 03/15/2012    Past Surgical History:  Procedure Laterality  Date   CORONARY ANGIOPLASTY WITH STENT PLACEMENT     CORONARY/GRAFT ACUTE MI REVASCULARIZATION N/A 07/13/2017   Procedure: Coronary/Graft Acute MI Revascularization;  Surgeon: Adrian Prows, MD;  Location: [place] [facility];  Service: Cardiovascular;  Laterality: N/A;   ESOPHAGOGASTRODUODENOSCOPY N/A 06/30/2020   Procedure: ESOPHAGOGASTRODUODENOSCOPY (EGD);  Surgeon: Toledo, Benay Pike, MD;  Location: [facility];  Service: Gastroenterology;  Laterality: N/A;   LEFT HEART CATH AND CORONARY ANGIOGRAPHY N/A 07/13/2017   Procedure: Left Heart Cath and Coronary Angiography;  Surgeon: Adrian Prows, MD;  Location: [place] [facility];  Service: Cardiovascular;  Laterality: N/A;   LEFT HEART CATH AND CORONARY ANGIOGRAPHY N/A 08/21/2017   Procedure: LEFT HEART CATH AND CORONARY ANGIOGRAPHY;  Surgeon: Nigel Mormon, MD;  Location: [place] [facility];  Service: Cardiovascular;  Laterality: N/A;   LEFT HEART CATH AND CORONARY ANGIOGRAPHY N/A 08/10/2020   Procedure: LEFT HEART CATH AND CORONARY ANGIOGRAPHY;  Surgeon: Nigel Mormon, MD;  Location: [place] [facility];  Service: Cardiovascular;  Laterality: N/A;   SKIN GRAFT     VENTRICULAR ASSIST DEVICE INSERTION N/A 07/13/2017   Procedure: Ventricular Assist Device Insertion;  Surgeon: Adrian Prows, MD;  Location: [place] [facility];  Service: Cardiovascular;  Laterality: N/A;       Home Medications    Prior to Admission medications   Medication Sig Start Date End Date Taking? Authorizing Provider  amoxicillin-clavulanate (AUGMENTIN) [phone]  MG tablet Take 1 tablet by mouth 2 times daily for 14 days. 11/23/21  Yes   aspirin 81 MG chewable tablet Chew 1 tablet (81 mg total) by mouth daily. 04/11/21  Yes Patwardhan, Manish J, MD  atorvastatin (LIPITOR) 20 MG tablet TAKE 1 TABLET BY MOUTH DAILY. 09/12/21 09/12/22 Yes Adrian Prows, MD  carvedilol (COREG) 3.125 MG tablet Take 1 tablet (3.125 mg total) by mouth 2 (two) times daily. 05/06/21  Yes Patwardhan,  Reynold Bowen, MD  dapagliflozin propanediol (FARXIGA) 10 MG TABS tablet Take 1 tablet (10 mg total) by mouth daily before breakfast. 08/24/21  Yes Patwardhan, Manish J, MD  DULoxetine (CYMBALTA) 20 MG capsule Take 1 capsule (20 mg total) by mouth 2 times daily. 11/23/21  Yes   Evolocumab (REPATHA SURECLICK) 542 MG/ML SOAJ inject 140mg  every 14 days 04/11/21 04/11/22 Yes Patwardhan, Manish J, MD  ezetimibe (ZETIA) 10 MG tablet TAKE 1 TABLET (10 MG TOTAL) BY MOUTH DAILY. 10/11/21 10/11/22 Yes Patwardhan, Manish J, MD  FLUoxetine (PROZAC) 40 MG capsule TAKE 1 CAPSULE (40 MG TOTAL) BY MOUTH DAILY. 12/30/20 12/30/21 Yes Aletha Halim., PA-C  ivabradine (CORLANOR) 5 MG TABS tablet Take 1 tablet (5 mg total) by mouth 2 (two) times daily with a meal. 04/20/21  Yes Patwardhan, Manish J, MD  levothyroxine (SYNTHROID) 137 MCG tablet Take 1 tablet (137 mcg total) by mouth daily. 11/23/21  Yes   nitroGLYCERIN (NITROSTAT) 0.4 MG SL tablet PLACE 1 TABLET UNDER THE TONGUE EVERY 5 MINUTES AS NEEDED FOR CHEST PAIN. 12/29/20 12/29/21 Yes Patwardhan, Manish J, MD  pantoprazole (PROTONIX) 40 MG tablet TAKE 1 TABLET (40 MG TOTAL) BY MOUTH DAILY. 04/11/21 04/11/22 Yes Patwardhan, Manish J, MD  pantoprazole (PROTONIX) 40 MG tablet Take 1 tablet by mouth daily 11/23/21  Yes   sacubitril-valsartan (ENTRESTO) 24-26 MG Take 1 tablet by mouth 2 (two) times daily. 04/11/21  Yes Patwardhan, Manish J, MD  sildenafil (VIAGRA) 100 MG tablet Take 1 tablet (100 mg total) by mouth as needed for erectile dysfunction. TAKE 1 (ONE) TABLET AS NEEDED. DO NOT USE AT SAME TIME AS NITRO 04/11/21  Yes Patwardhan, Manish J, MD  spironolactone (ALDACTONE) 25 MG tablet Take 1 tablet (25 mg total) by mouth daily. Please disregard duplicate prescription of 90 pills 04/11/21 01/30/22 Yes Patwardhan, Manish J, MD  tamsulosin (FLOMAX) 0.4 MG CAPS capsule Take 2 capsules (0.8 mg total) by mouth daily. 11/25/21  Yes Marney Setting, NP  traMADol (ULTRAM) 50 MG tablet Take  1 tablet (50 mg total) by mouth every 6 (six) hours as needed. 11/25/21  Yes Marney Setting, NP  torsemide (DEMADEX) 20 MG tablet Take 1 tablet (20 mg total) by mouth as needed. 07/18/21 10/16/21  Nigel Mormon, MD    Family History Family History  Problem Relation Age of Onset   Heart disease Father    Hypertension Father    Heart disease Paternal Grandmother     Social History Social History   Tobacco Use   Smoking status: Former    Packs/day: 0.50    Years: 20.00    Pack years: 10.00    Types: Cigarettes    Quit date: 07/13/2017    Years since quitting: 4.3   Smokeless tobacco: Never   Tobacco comments:    quit 2012  Vaping Use   Vaping Use: Never used  Substance Use Topics   Alcohol use: Yes    Comment: occ   Drug use: No     Allergies  Patient has no known allergies.   Review of Systems Review of Systems  Constitutional:  Positive for fatigue. Negative for fever.       Chronic fatigue from heart failure. Per pt no change   HENT:  Positive for congestion.   Respiratory:  Positive for cough and shortness of breath.   Cardiovascular: Negative.  Negative for palpitations and leg swelling.  Gastrointestinal:  Positive for abdominal pain, nausea and vomiting. Negative for abdominal distention, anal bleeding, blood in stool, constipation and diarrhea.  Genitourinary: Negative.   Musculoskeletal: Negative.   Skin: Negative.   Neurological: Negative.     Physical Exam Triage Vital Signs ED Triage Vitals  Enc Vitals Group     BP 11/25/21 0838 118/83     Pulse Rate 11/25/21 0838 66     Resp 11/25/21 0837 18     Temp 11/25/21 0838 98.4 F (36.9 C)     Temp Source 11/25/21 0837 Oral     SpO2 11/25/21 0838 98 %     Weight 11/25/21 0835 225 lb (102.1 kg)     Height 11/25/21 0835 6\' 1"  (1.854 m)     Head Circumference --      Peak Flow --      Pain Score 11/25/21 0835 3     Pain Loc --      Pain Edu? --      Excl. in [place]? --    No data  found.  Updated Vital Signs BP 118/83 (BP Location: Left Arm)   Pulse 66   Temp 98.4 F (36.9 C) (Oral)   Resp 18   Ht 6\' 1"  (1.854 m)   Wt 225 lb (102.1 kg)   SpO2 98%   BMI 29.69 kg/m   Visual Acuity Right Eye Distance:   Left Eye Distance:   Bilateral Distance:    Right Eye Near:   Left Eye Near:    Bilateral Near:     Physical Exam   UC Treatments / Results  Labs (all labs ordered are listed, but only abnormal results are displayed) Labs Reviewed  URINALYSIS, COMPLETE (UACMP) WITH MICROSCOPIC - Abnormal; Notable for the following components:      Result Value   Glucose, UA >1,000 (*)    All other components within normal limits  GLUCOSE, CAPILLARY - Abnormal; Notable for the following components:   Glucose-Capillary 130 (*)    All other components within normal limits  CBG MONITORING, ED    EKG   Radiology DG Abdomen 1 View  Result Date: 11/25/2021 CLINICAL DATA:  Right lower quadrant abdominal pain. EXAM: ABDOMEN - 1 VIEW COMPARISON:  None. FINDINGS: Nonobstructive bowel gas pattern. There are no radiopaque calculi overlying the kidneys. There is a 3 mm calcification overlying the right aspect of the bladder. There are other calcifications overlying the left aspect of the bladder. No acute osseous abnormality. IMPRESSION: 3 mm calcification overlying the right aspect of the bladder, could potentially represent a distal ureter stone given history of right-sided pain. Similar calcifications on the left side, thus these could also represent phleboliths. No radiopaque calculi overlying the kidneys. CT would be useful for further evaluation if obstructive uropathy or other acute abdominal process is of clinical concern. No evidence of bowel obstruction. Electronically Signed   By: Maurine Simmering M.D.   On: 11/25/2021 09:46    Procedures Procedures (including critical care time)  Medications Ordered in UC Medications - No data to display  Initial Impression /  Assessment and  Plan / UC Course  I have reviewed the triage vital signs and the nursing notes.  Pertinent labs & imaging results that were available during my care of the patient were reviewed by me and considered in my medical decision making (see chart for details).     U/a shown elevated glucose finger stick 130 reviewing previous charts a1c was 6 and they are monitoring this.  Expressed that pt would need to have a ct scan to rule out hernia, appendicitis, kidney stone or other causes of pain. Pt will need to go to er or call pcp for this. A ct scan is not available at this UC today.  Labs was completed at [facility] office and was normal  Discussed the x ray shown kidney stones pt will need to follow up with urology  Educated in details the causes. Pt will need to call cardiology to see if some of the medications if this is a cause and if pt should stay on the fluid pil BID.    Final Clinical Impressions(s) / UC Diagnoses   Final diagnoses:  Right lower quadrant abdominal pain  Vomiting without nausea, unspecified vomiting type  Kidney stone     Discharge Instructions      Your x ray shown a 3 mm stone  You will need to call cardiology about pushing fluids and cautious with this due to your heart failure  Call urology for follow up next week  You may pass the stone of this size if pain persist go to er  Take pain meds as needed or tyelnol       ED Prescriptions     Medication Sig Dispense Auth. Provider   tamsulosin (FLOMAX) 0.4 MG CAPS capsule Take 2 capsules (0.8 mg total) by mouth daily. 30 capsule Morley Kos L, NP   traMADol (ULTRAM) 50 MG tablet Take 1 tablet (50 mg total) by mouth every 6 (six) hours as needed. 15 tablet Marney Setting, NP      I have reviewed the PDMP during this encounter.   Marney Setting, NP 11/25/21 1003

## 2021-11-25 NOTE — ED Triage Notes (Signed)
Pt here with C/O went to PCP on Wednesday, was given antibiotic for URI, shingles shot, and flu shot. Wednesday night started having lower abdominal right pain that radiates into right lower back. Woke up Thursday morning vomiting with fever. C/o coughing and nausea now, denies fever.

## 2021-11-25 NOTE — Discharge Instructions (Addendum)
Your x ray shown a 3 mm stone  You will need to call cardiology about pushing fluids and cautious with this due to your heart failure  Call urology for follow up next week  You may pass the stone of this size if pain persist go to er  Take pain meds as needed or tyelnol

## 2021-11-28 ENCOUNTER — Other Ambulatory Visit: Payer: Self-pay

## 2021-11-29 ENCOUNTER — Other Ambulatory Visit: Payer: Self-pay | Admitting: *Deleted

## 2021-11-29 ENCOUNTER — Encounter: Payer: Self-pay | Admitting: Urology

## 2021-11-29 ENCOUNTER — Other Ambulatory Visit: Payer: Self-pay

## 2021-11-29 ENCOUNTER — Ambulatory Visit (INDEPENDENT_AMBULATORY_CARE_PROVIDER_SITE_OTHER): Payer: 59 | Admitting: Urology

## 2021-11-29 ENCOUNTER — Other Ambulatory Visit
Admission: RE | Admit: 2021-11-29 | Discharge: 2021-11-29 | Disposition: A | Discharge status: Home / Self Care | Payer: 59 | Source: Home / Self Care | Attending: Urology | Admitting: Urology

## 2021-11-29 ENCOUNTER — Telehealth: Payer: Self-pay

## 2021-11-29 ENCOUNTER — Ambulatory Visit
Admission: RE | Admit: 2021-11-29 | Discharge: 2021-11-29 | Disposition: A | Discharge status: Home / Self Care | Payer: 59 | Source: Ambulatory Visit | Attending: Urology | Admitting: Urology

## 2021-11-29 VITALS — BP 103/68 | HR 68 | Ht 73.0 in | Wt 225.0 lb

## 2021-11-29 DIAGNOSIS — I700 Atherosclerosis of aorta: Secondary | ICD-10-CM | POA: Diagnosis not present

## 2021-11-29 DIAGNOSIS — N200 Calculus of kidney: Secondary | ICD-10-CM

## 2021-11-29 DIAGNOSIS — R1031 Right lower quadrant pain: Secondary | ICD-10-CM | POA: Insufficient documentation

## 2021-11-29 LAB — URINALYSIS, COMPLETE (UACMP) WITH MICROSCOPIC
Bilirubin Urine: NEGATIVE
Glucose, UA: 500 mg/dL — AB
Hgb urine dipstick: NEGATIVE
Ketones, ur: NEGATIVE mg/dL
Leukocytes,Ua: NEGATIVE
Nitrite: NEGATIVE
Protein, ur: NEGATIVE mg/dL
Specific Gravity, Urine: 1.01 (ref 1.005–1.030)
pH: 5.5 (ref 5.0–8.0)

## 2021-11-29 MED ORDER — HYDROCODONE-ACETAMINOPHEN 5-325 MG PO TABS
1.0000 | ORAL_TABLET | Freq: Four times a day (QID) | ORAL | 0 refills | Status: AC | PRN
  Filled 2021-11-29: qty 8, 2d supply, fill #0

## 2021-11-29 NOTE — Progress Notes (Signed)
11/29/21 11:03 AM   Margrett Rud October 26, 1971 [id number]  CC: Right lower quadrant pain, possible right ureteral stone  HPI: 50 year old male with about a week of right lower quadrant pain.  He got the flu shot last week and had fever and malaise as well as right-sided lower groin pain and pressure.  He was seen in urgent care and urinalysis was benign, KUB suggested a possible 3 mm right distal ureteral stone, and he was discharged with medical expulsive therapy.  His pain is about a 2 out of 10, and seems to be worse with certain positions.  He has not had any recent fevers.  He denies any gross hematuria or urinary symptoms.  He denies any history of kidney stones.  He is interested in CT today for more definitive diagnosis.   PMH: Past Medical History:  Diagnosis Date   Acute MI anterior wall first episode care ([place]) 07/13/2017   Occluded ostial LAD S/P 3.5x26 Onyx DES. RI 80%. RCA Occluded at prior stent site with collaterals    Cardiogenic shock ([place]) 07/13/2017   CHF (congestive heart failure) ([facility])    Graves disease    Hypertension    MI (myocardial infarction) ([place])    Thyroid disease    thyroid irradiated per pt    Surgical History: Past Surgical History:  Procedure Laterality Date   CORONARY ANGIOPLASTY WITH STENT PLACEMENT     CORONARY/GRAFT ACUTE MI REVASCULARIZATION N/A 07/13/2017   Procedure: Coronary/Graft Acute MI Revascularization;  Surgeon: Adrian Prows, MD;  Location: [place] [facility];  Service: Cardiovascular;  Laterality: N/A;   ESOPHAGOGASTRODUODENOSCOPY N/A 06/30/2020   Procedure: ESOPHAGOGASTRODUODENOSCOPY (EGD);  Surgeon: Toledo, Benay Pike, MD;  Location: [facility];  Service: Gastroenterology;  Laterality: N/A;   LEFT HEART CATH AND CORONARY ANGIOGRAPHY N/A 07/13/2017   Procedure: Left Heart Cath and Coronary Angiography;  Surgeon: Adrian Prows, MD;  Location: [place] [facility];  Service: Cardiovascular;  Laterality: N/A;   LEFT HEART CATH AND CORONARY  ANGIOGRAPHY N/A 08/21/2017   Procedure: LEFT HEART CATH AND CORONARY ANGIOGRAPHY;  Surgeon: Nigel Mormon, MD;  Location: [place] [facility];  Service: Cardiovascular;  Laterality: N/A;   LEFT HEART CATH AND CORONARY ANGIOGRAPHY N/A 08/10/2020   Procedure: LEFT HEART CATH AND CORONARY ANGIOGRAPHY;  Surgeon: Nigel Mormon, MD;  Location: [place] [facility];  Service: Cardiovascular;  Laterality: N/A;   SKIN GRAFT     VENTRICULAR ASSIST DEVICE INSERTION N/A 07/13/2017   Procedure: Ventricular Assist Device Insertion;  Surgeon: Adrian Prows, MD;  Location: [place] [facility];  Service: Cardiovascular;  Laterality: N/A;   Family History: Family History  Problem Relation Age of Onset   Heart disease Father    Hypertension Father    Heart disease Paternal Grandmother     Social History:  reports that he quit smoking about 4 years ago. His smoking use included cigarettes. He has a 10.00 pack-year smoking history. He has never used smokeless tobacco. He reports current alcohol use. He reports that he does not use drugs.  Physical Exam: BP 103/68   Pulse 68   Ht 6\' 1"  (1.854 m)   Wt 225 lb (102.1 kg)   BMI 29.69 kg/m    Constitutional:  Alert and oriented, No acute distress. Cardiovascular: No clubbing, cyanosis, or edema. Respiratory: Normal respiratory effort, no increased work of breathing. GI: Abdomen is soft, nontender, nondistended, no abdominal masses, no rebound tenderness   Laboratory Data: Reviewed, see HPI  Pertinent Imaging: I  have personally viewed and interpreted the CT stone protocol from today that shows no evidence of stones or hydronephrosis, no other acute abnormality.  Assessment & Plan:   50 year old male with fevers and malaise after getting the flu shot last week and persistent right lower quadrant pressure and pain of unclear etiology.  Urinalysis benign and he opted for a CT scan today for further evaluation.  CT shows no evidence of hydronephrosis or  stone disease, nondistended bladder, no definite etiology of his pain.  Suspect more likely to be musculoskeletal in nature, and recommend PCP follow-up with no improvement over the next few days.  Follow-up with urology as needed Okay to discontinue Flomax 8 tabs Norco given for acute pain   Nickolas Madrid, MD 11/29/2021  [facility] [address] [place], [place] [address] [phone]

## 2021-11-29 NOTE — Telephone Encounter (Signed)
Called pt informed him of the information below. Pt gave verbal understanding.  

## 2021-11-29 NOTE — Telephone Encounter (Signed)
-----   Message from Billey Co, MD sent at 11/29/2021  2:21 PM EST ----- Good news, CT completely normal, no evidence of stones or blockage or other etiology of his pain.  He may have passed a small stone, or this is musculoskeletal in nature and should improve over the next few days.  Recommend PCP follow-up if no improvement within the next week  Nickolas Madrid, MD 11/29/2021

## 2021-11-30 ENCOUNTER — Other Ambulatory Visit: Payer: Self-pay

## 2021-11-30 MED ORDER — FLUOXETINE HCL 20 MG PO CAPS
20.0000 mg | ORAL_CAPSULE | Freq: Every day | ORAL | 0 refills | Status: DC
  Filled 2021-11-30: qty 30, 30d supply, fill #0

## 2021-12-13 ENCOUNTER — Other Ambulatory Visit: Payer: Self-pay | Admitting: Cardiology

## 2021-12-13 ENCOUNTER — Other Ambulatory Visit: Payer: Self-pay

## 2021-12-14 ENCOUNTER — Other Ambulatory Visit: Payer: Self-pay

## 2021-12-14 MED ORDER — ATORVASTATIN CALCIUM 20 MG PO TABS
ORAL_TABLET | Freq: Every day | ORAL | 0 refills | Status: DC
  Filled 2021-12-14: qty 90, 90d supply, fill #0

## 2021-12-20 ENCOUNTER — Other Ambulatory Visit: Payer: Self-pay | Admitting: Cardiology

## 2021-12-20 DIAGNOSIS — I5020 Unspecified systolic (congestive) heart failure: Secondary | ICD-10-CM

## 2021-12-21 ENCOUNTER — Other Ambulatory Visit: Payer: Self-pay

## 2021-12-21 MED ORDER — IVABRADINE HCL 5 MG PO TABS
5.0000 mg | ORAL_TABLET | Freq: Two times a day (BID) | ORAL | 1 refills | Status: DC
  Filled 2021-12-21: qty 180, 90d supply, fill #0
  Filled 2022-03-26: qty 180, 90d supply, fill #1

## 2021-12-21 MED ORDER — EZETIMIBE 10 MG PO TABS
ORAL_TABLET | Freq: Every day | ORAL | 0 refills | Status: DC
  Filled 2021-12-21: qty 90, 90d supply, fill #0

## 2021-12-22 ENCOUNTER — Other Ambulatory Visit: Payer: Self-pay

## 2022-01-11 ENCOUNTER — Other Ambulatory Visit: Payer: Self-pay

## 2022-01-24 ENCOUNTER — Other Ambulatory Visit: Payer: Self-pay

## 2022-01-24 DIAGNOSIS — F3341 Major depressive disorder, recurrent, in partial remission: Secondary | ICD-10-CM | POA: Diagnosis not present

## 2022-01-24 DIAGNOSIS — Z125 Encounter for screening for malignant neoplasm of prostate: Secondary | ICD-10-CM | POA: Diagnosis not present

## 2022-01-24 DIAGNOSIS — I5042 Chronic combined systolic (congestive) and diastolic (congestive) heart failure: Secondary | ICD-10-CM | POA: Diagnosis not present

## 2022-01-24 DIAGNOSIS — Z23 Encounter for immunization: Secondary | ICD-10-CM | POA: Diagnosis not present

## 2022-01-24 MED ORDER — FLUOXETINE HCL 20 MG PO CAPS
ORAL_CAPSULE | ORAL | 3 refills | Status: DC
  Filled 2022-01-24: qty 90, 90d supply, fill #0
  Filled 2022-04-30: qty 90, 90d supply, fill #1
  Filled 2022-07-30: qty 90, 90d supply, fill #2
  Filled 2022-10-31: qty 90, 90d supply, fill #3

## 2022-02-02 ENCOUNTER — Other Ambulatory Visit: Payer: Self-pay

## 2022-02-03 ENCOUNTER — Other Ambulatory Visit: Payer: Self-pay

## 2022-02-06 DIAGNOSIS — I2510 Atherosclerotic heart disease of native coronary artery without angina pectoris: Secondary | ICD-10-CM | POA: Diagnosis not present

## 2022-02-06 DIAGNOSIS — Z1211 Encounter for screening for malignant neoplasm of colon: Secondary | ICD-10-CM | POA: Diagnosis not present

## 2022-02-06 DIAGNOSIS — K219 Gastro-esophageal reflux disease without esophagitis: Secondary | ICD-10-CM | POA: Diagnosis not present

## 2022-02-07 ENCOUNTER — Other Ambulatory Visit: Payer: Self-pay

## 2022-02-07 MED ORDER — CLENPIQ 10-3.5-12 MG-GM -GM/160ML PO SOLN
ORAL | 0 refills | Status: DC
  Filled 2022-02-07: qty 320, 1d supply, fill #0

## 2022-02-10 ENCOUNTER — Other Ambulatory Visit: Payer: Self-pay

## 2022-02-13 ENCOUNTER — Ambulatory Visit: Payer: BC Managed Care – PPO | Admitting: Dermatology

## 2022-02-27 ENCOUNTER — Other Ambulatory Visit: Payer: Self-pay

## 2022-02-28 DIAGNOSIS — D123 Benign neoplasm of transverse colon: Secondary | ICD-10-CM | POA: Diagnosis not present

## 2022-02-28 DIAGNOSIS — D120 Benign neoplasm of cecum: Secondary | ICD-10-CM | POA: Diagnosis not present

## 2022-02-28 DIAGNOSIS — D125 Benign neoplasm of sigmoid colon: Secondary | ICD-10-CM | POA: Diagnosis not present

## 2022-02-28 DIAGNOSIS — K635 Polyp of colon: Secondary | ICD-10-CM | POA: Diagnosis not present

## 2022-02-28 DIAGNOSIS — Z1211 Encounter for screening for malignant neoplasm of colon: Secondary | ICD-10-CM | POA: Diagnosis not present

## 2022-03-13 ENCOUNTER — Ambulatory Visit: Payer: 59 | Admitting: Dermatology

## 2022-03-15 ENCOUNTER — Other Ambulatory Visit: Payer: Self-pay

## 2022-03-18 ENCOUNTER — Other Ambulatory Visit: Payer: Self-pay | Admitting: Cardiology

## 2022-03-20 ENCOUNTER — Other Ambulatory Visit: Payer: Self-pay

## 2022-03-20 MED ORDER — ATORVASTATIN CALCIUM 20 MG PO TABS
ORAL_TABLET | Freq: Every day | ORAL | 0 refills | Status: DC
  Filled 2022-03-20: qty 90, 90d supply, fill #0

## 2022-03-27 ENCOUNTER — Other Ambulatory Visit: Payer: Self-pay

## 2022-03-28 ENCOUNTER — Other Ambulatory Visit: Payer: Self-pay

## 2022-03-29 ENCOUNTER — Other Ambulatory Visit: Payer: Self-pay

## 2022-04-06 ENCOUNTER — Ambulatory Visit (INDEPENDENT_AMBULATORY_CARE_PROVIDER_SITE_OTHER): Payer: 59 | Admitting: Dermatology

## 2022-04-06 DIAGNOSIS — L573 Poikiloderma of Civatte: Secondary | ICD-10-CM | POA: Diagnosis not present

## 2022-04-06 DIAGNOSIS — L719 Rosacea, unspecified: Secondary | ICD-10-CM

## 2022-04-06 DIAGNOSIS — Z1283 Encounter for screening for malignant neoplasm of skin: Secondary | ICD-10-CM | POA: Diagnosis not present

## 2022-04-06 DIAGNOSIS — L578 Other skin changes due to chronic exposure to nonionizing radiation: Secondary | ICD-10-CM

## 2022-04-06 DIAGNOSIS — L821 Other seborrheic keratosis: Secondary | ICD-10-CM

## 2022-04-06 DIAGNOSIS — D224 Melanocytic nevi of scalp and neck: Secondary | ICD-10-CM | POA: Diagnosis not present

## 2022-04-06 DIAGNOSIS — D229 Melanocytic nevi, unspecified: Secondary | ICD-10-CM

## 2022-04-06 DIAGNOSIS — D492 Neoplasm of unspecified behavior of bone, soft tissue, and skin: Secondary | ICD-10-CM

## 2022-04-06 DIAGNOSIS — D485 Neoplasm of uncertain behavior of skin: Secondary | ICD-10-CM | POA: Diagnosis not present

## 2022-04-06 DIAGNOSIS — D1800 Hemangioma unspecified site: Secondary | ICD-10-CM | POA: Diagnosis not present

## 2022-04-06 DIAGNOSIS — L814 Other melanin hyperpigmentation: Secondary | ICD-10-CM | POA: Diagnosis not present

## 2022-04-06 NOTE — Patient Instructions (Addendum)
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
## 2022-04-08 ENCOUNTER — Other Ambulatory Visit: Payer: Self-pay | Admitting: Cardiology

## 2022-04-08 DIAGNOSIS — I255 Ischemic cardiomyopathy: Secondary | ICD-10-CM

## 2022-04-09 IMAGING — US US ABDOMEN LIMITED
1 series · 14 of 25 positions shown · non-contrast
Comparison: None.

CLINICAL DATA: Right upper quadrant and epigastric pain with nausea
and vomiting on and off for weeks

EXAM:
ULTRASOUND ABDOMEN LIMITED RIGHT UPPER QUADRANT

[Series 1: us abdomen limited · 0.26mm/px · 14 of 27 slices shown]
[im 1/27]
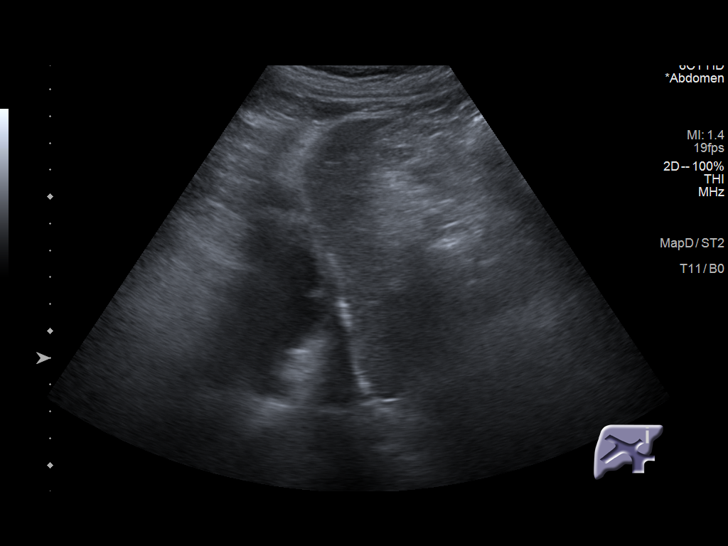
[im 3/27]
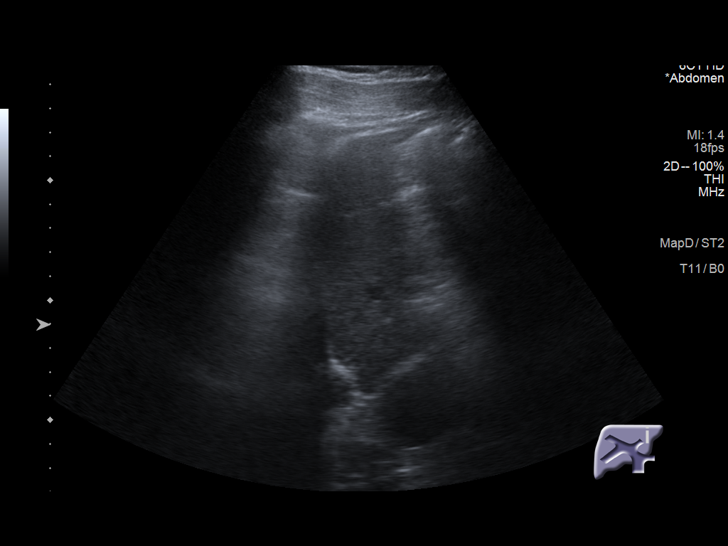
[im 5/27]
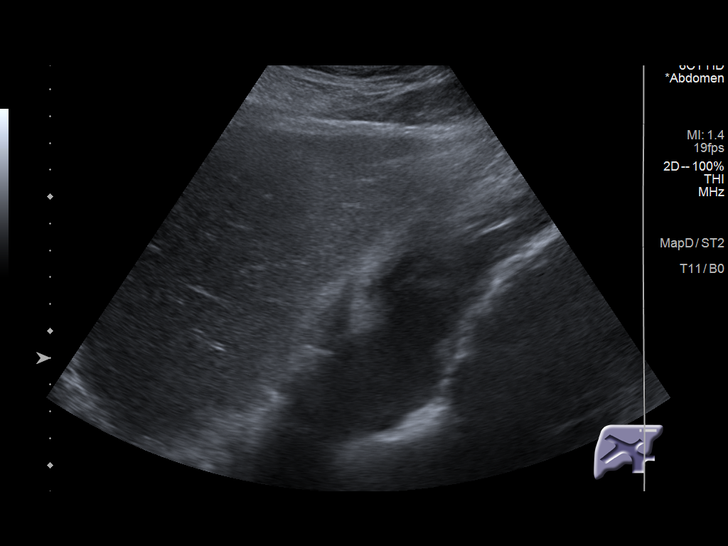
[im 7/27]
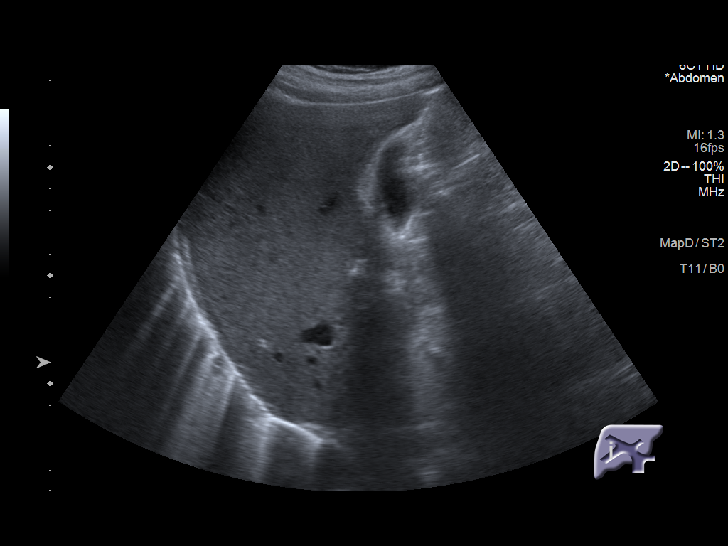
[im 9/27]
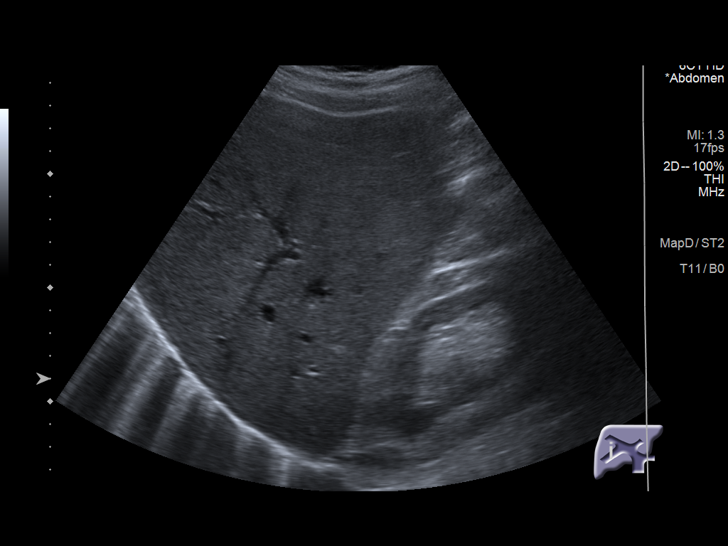
[im 10/27]
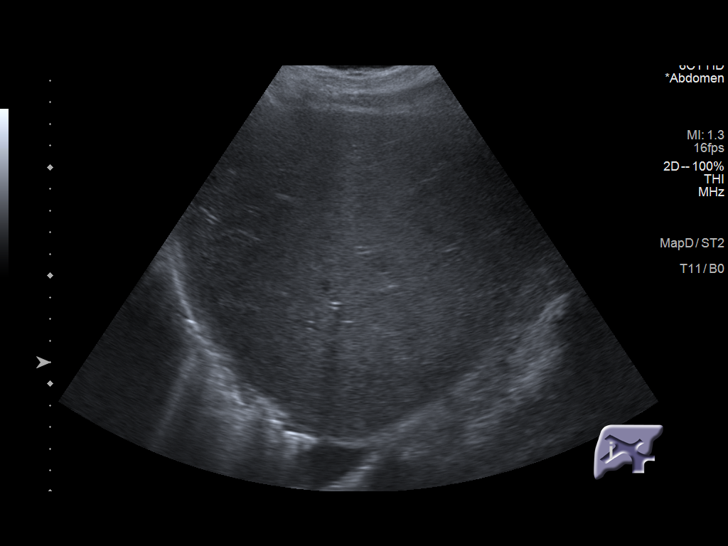
[im 12/27]
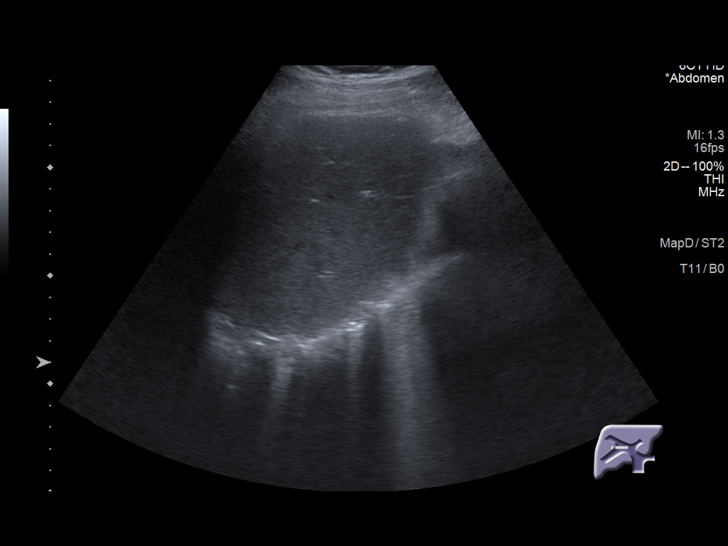
[im 15/27]
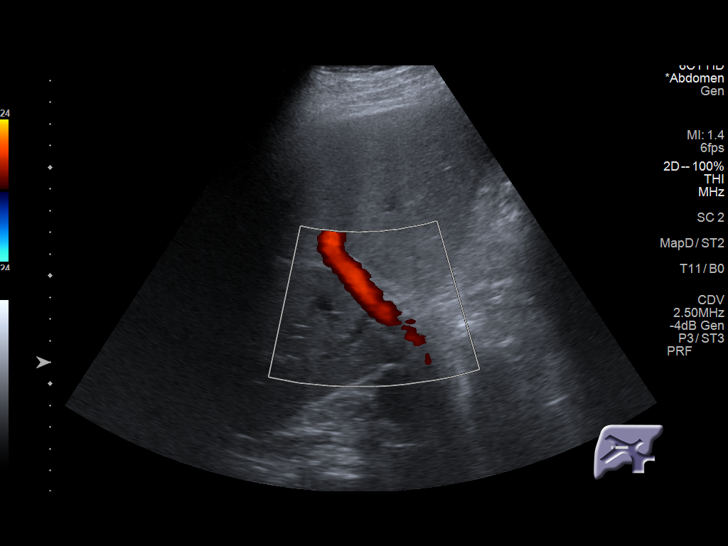
[im 17/27]
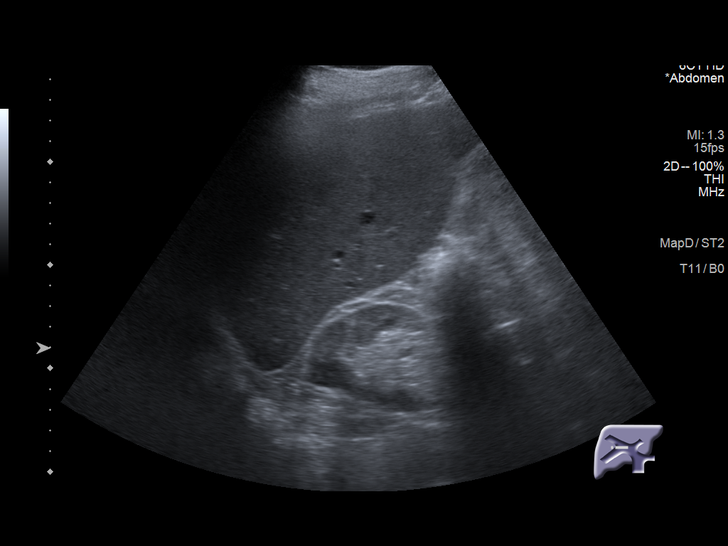
[im 18/27]
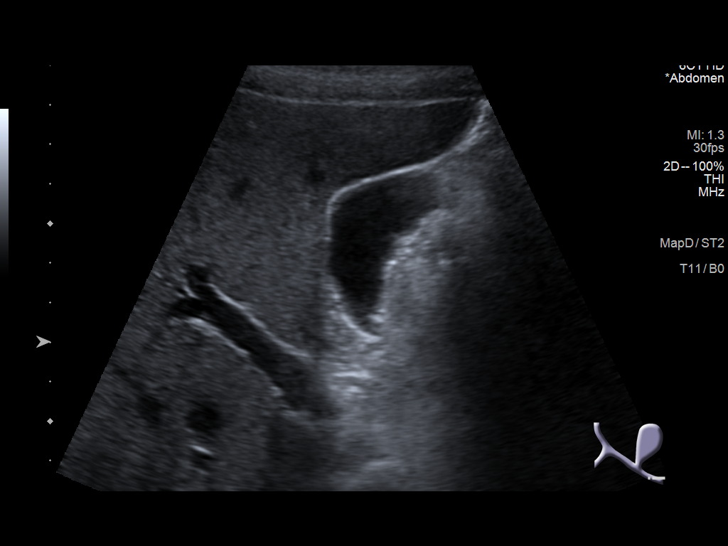
[im 20/27]
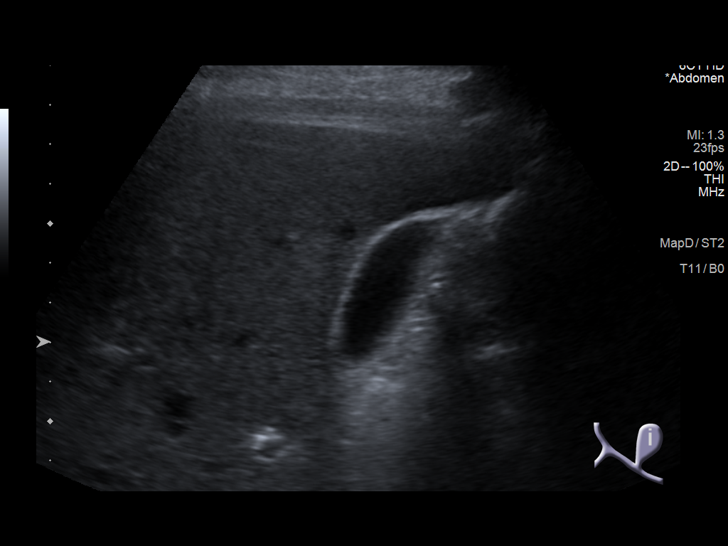
[im 22/27]
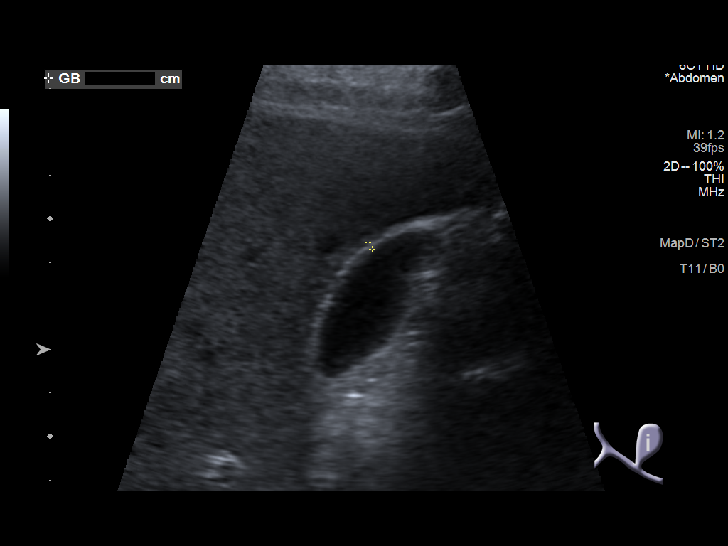
[im 24/27]
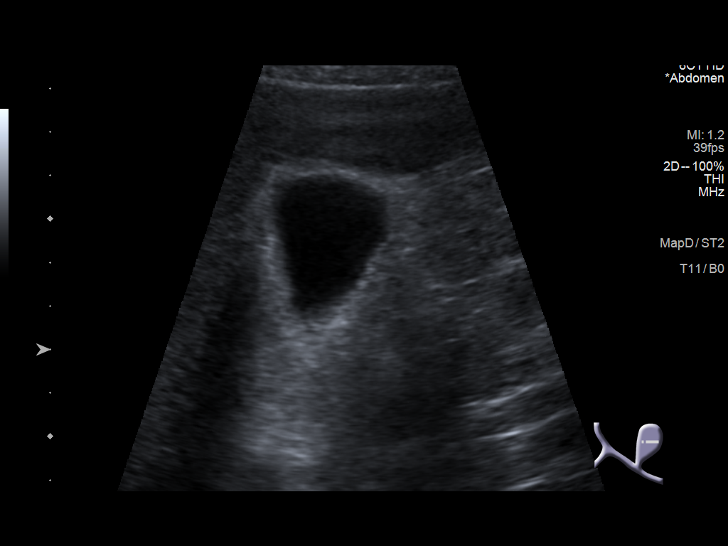
[im 27/27]
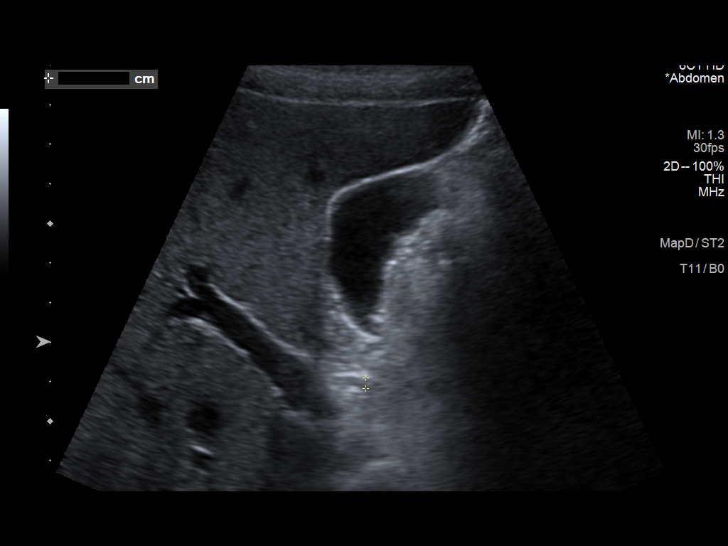

[14 of 25 positions shown; findings below may reference images not displayed]

FINDINGS: Gallbladder:

No gallstones or wall thickening visualized. No sonographic Murphy
sign noted by sonographer.

Common bile duct:

Diameter: 3 mm

Liver:

No focal lesion identified. Within normal limits in parenchymal
echogenicity. Portal vein is patent on color Doppler imaging with
normal direction of blood flow towards the liver.

Other: Right pleural effusion that is small volume by
contemporaneous radiography
IMPRESSION: 1. Small right pleural effusion.
2. Negative gallbladder.

## 2022-04-09 IMAGING — DX DG CHEST 1V PORT
1 series · 1 of 1 positions shown · non-contrast
Comparison: July 13, 2017

CLINICAL DATA: Upper abdominal/epigastric region pain.
Hypertension.

EXAM:
PORTABLE CHEST 1 VIEW

[chest ap]
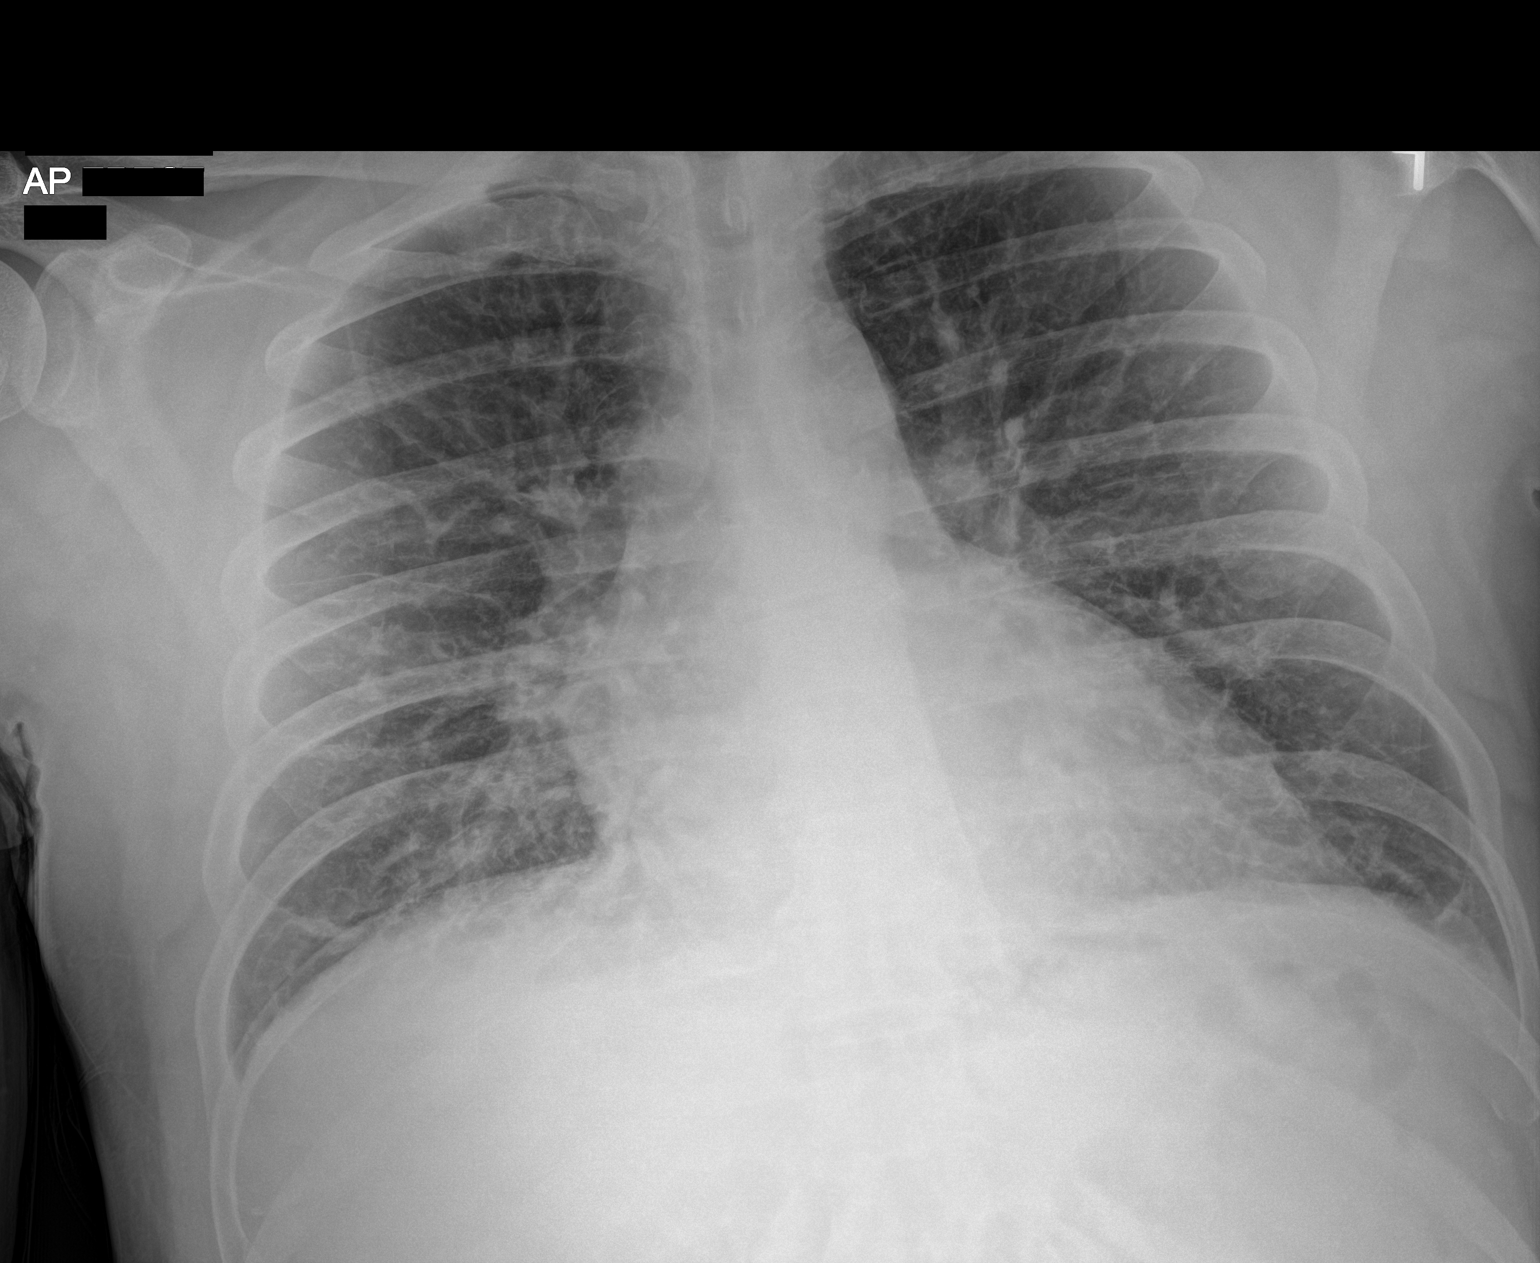

[1 of 1 positions shown; findings below may reference images not displayed]

FINDINGS: No edema or airspace opacity. Heart is borderline enlarged with
pulmonary vascularity normal. No adenopathy. No bone lesions. No
pneumothorax.
IMPRESSION: Borderline cardiac enlargement.  No edema or airspace opacity.

## 2022-04-10 ENCOUNTER — Other Ambulatory Visit: Payer: Self-pay

## 2022-04-10 MED ORDER — ENTRESTO 24-26 MG PO TABS
1.0000 | ORAL_TABLET | Freq: Two times a day (BID) | ORAL | 3 refills | Status: DC
  Filled 2022-04-10: qty 180, 90d supply, fill #0
  Filled 2022-07-12: qty 180, 90d supply, fill #1
  Filled 2022-10-08: qty 180, 90d supply, fill #2
  Filled 2023-01-15: qty 180, 90d supply, fill #3

## 2022-04-11 ENCOUNTER — Telehealth: Payer: Self-pay

## 2022-04-11 ENCOUNTER — Encounter: Payer: Self-pay | Admitting: Dermatology

## 2022-04-11 NOTE — Telephone Encounter (Signed)
Left pt message to call for bx result/sh 

## 2022-04-11 NOTE — Telephone Encounter (Signed)
-----   Message from Ralene Bathe, MD sent at 04/10/2022  1:29 PM EDT ----- ?Diagnosis ?1. Skin , left post lat neck at the post hairline ?LICHEN SIMPLEX CHRONICUS OVERLYING MELANOCYTIC NEVUS, INTRADERMAL TYPE, DEEP MARGIN INVOLVED ?2. Skin , right buttocks ?BENIGN EPIDERMAL HYPERPLASIA WITH HYPERKERATOSIS, SEE DESCRIPTION ? ?1- benign mole with irritation and thickened skin ?No further treatment needed ?2- Benign thickened skin  ?No further treatment needed ?

## 2022-04-12 ENCOUNTER — Other Ambulatory Visit: Payer: Self-pay

## 2022-04-30 ENCOUNTER — Other Ambulatory Visit: Payer: Self-pay | Admitting: Cardiology

## 2022-04-30 DIAGNOSIS — I255 Ischemic cardiomyopathy: Secondary | ICD-10-CM

## 2022-04-30 DIAGNOSIS — N529 Male erectile dysfunction, unspecified: Secondary | ICD-10-CM

## 2022-04-30 DIAGNOSIS — I5020 Unspecified systolic (congestive) heart failure: Secondary | ICD-10-CM

## 2022-05-01 ENCOUNTER — Other Ambulatory Visit: Payer: Self-pay

## 2022-05-01 MED ORDER — SILDENAFIL CITRATE 100 MG PO TABS
100.0000 mg | ORAL_TABLET | ORAL | 3 refills | Status: DC | PRN
  Filled 2022-05-01: qty 6, 30d supply, fill #0
  Filled 2022-07-30: qty 6, 30d supply, fill #1

## 2022-05-01 MED ORDER — CARVEDILOL 3.125 MG PO TABS
3.1250 mg | ORAL_TABLET | Freq: Two times a day (BID) | ORAL | 3 refills | Status: DC
  Filled 2022-05-01: qty 180, 90d supply, fill #0
  Filled 2022-07-30: qty 180, 90d supply, fill #1
  Filled 2022-10-31: qty 180, 90d supply, fill #2
  Filled 2023-01-30: qty 180, 90d supply, fill #3

## 2022-05-01 MED ORDER — EZETIMIBE 10 MG PO TABS
ORAL_TABLET | Freq: Every day | ORAL | 0 refills | Status: DC
  Filled 2022-05-01: qty 90, 90d supply, fill #0

## 2022-05-01 MED ORDER — SPIRONOLACTONE 25 MG PO TABS
25.0000 mg | ORAL_TABLET | Freq: Every day | ORAL | 3 refills | Status: DC
  Filled 2022-05-01: qty 90, 90d supply, fill #0
  Filled 2022-07-30: qty 70, 70d supply, fill #1
  Filled 2022-07-31: qty 20, 20d supply, fill #1
  Filled 2022-10-16: qty 90, 90d supply, fill #2
  Filled 2023-01-15: qty 90, 90d supply, fill #3

## 2022-05-02 ENCOUNTER — Other Ambulatory Visit: Payer: Self-pay

## 2022-05-02 ENCOUNTER — Other Ambulatory Visit: Payer: Self-pay | Admitting: Cardiology

## 2022-05-02 MED ORDER — REPATHA SURECLICK 140 MG/ML ~~LOC~~ SOAJ
SUBCUTANEOUS | 4 refills | Status: AC
Start: 2022-05-02 — End: 2023-07-13
  Filled 2022-05-02: qty 6, 84d supply, fill #0
  Filled 2022-07-30: qty 6, 84d supply, fill #1
  Filled 2022-10-31: qty 6, 84d supply, fill #2
  Filled 2023-01-22: qty 6, 84d supply, fill #3
  Filled 2023-04-16: qty 6, 84d supply, fill #4

## 2022-05-04 ENCOUNTER — Telehealth: Payer: Self-pay

## 2022-05-04 NOTE — Telephone Encounter (Signed)
Patient states that he reviewed pathology results on MyChart and has no further questions.  ?

## 2022-05-04 NOTE — Telephone Encounter (Signed)
Lft pt msg to call for bx result./sh 

## 2022-05-04 NOTE — Telephone Encounter (Signed)
-----   Message from Ralene Bathe, MD sent at 04/10/2022  1:29 PM EDT ----- ?Diagnosis ?1. Skin , left post lat neck at the post hairline ?LICHEN SIMPLEX CHRONICUS OVERLYING MELANOCYTIC NEVUS, INTRADERMAL TYPE, DEEP MARGIN INVOLVED ?2. Skin , right buttocks ?BENIGN EPIDERMAL HYPERPLASIA WITH HYPERKERATOSIS, SEE DESCRIPTION ? ?1- benign mole with irritation and thickened skin ?No further treatment needed ?2- Benign thickened skin  ?No further treatment needed ?

## 2022-05-15 ENCOUNTER — Other Ambulatory Visit: Payer: Self-pay

## 2022-05-25 ENCOUNTER — Other Ambulatory Visit: Payer: Self-pay | Admitting: Cardiology

## 2022-05-25 DIAGNOSIS — I5020 Unspecified systolic (congestive) heart failure: Secondary | ICD-10-CM

## 2022-05-26 ENCOUNTER — Other Ambulatory Visit: Payer: Self-pay

## 2022-05-29 ENCOUNTER — Other Ambulatory Visit: Payer: Self-pay

## 2022-05-30 ENCOUNTER — Other Ambulatory Visit: Payer: Self-pay

## 2022-05-30 ENCOUNTER — Telehealth: Payer: Self-pay | Admitting: Cardiology

## 2022-05-30 DIAGNOSIS — I5020 Unspecified systolic (congestive) heart failure: Secondary | ICD-10-CM

## 2022-05-30 MED ORDER — DAPAGLIFLOZIN PROPANEDIOL 10 MG PO TABS
10.0000 mg | ORAL_TABLET | Freq: Every day | ORAL | 2 refills | Status: DC
  Filled 2022-05-30: qty 90, 90d supply, fill #0
  Filled 2022-08-28: qty 90, 90d supply, fill #1
  Filled 2022-11-30: qty 90, 90d supply, fill #2

## 2022-05-30 NOTE — Telephone Encounter (Signed)
Patient made annual follow up appointment. Patient's wife requesting refill for farxiga prior to appointment if possible.

## 2022-06-01 ENCOUNTER — Other Ambulatory Visit: Payer: Self-pay

## 2022-06-01 DIAGNOSIS — I5020 Unspecified systolic (congestive) heart failure: Secondary | ICD-10-CM

## 2022-06-01 NOTE — Telephone Encounter (Signed)
Refill was sent in to pts pharmacy 05/30/2022.

## 2022-06-08 ENCOUNTER — Encounter: Payer: Self-pay | Admitting: Cardiology

## 2022-06-08 ENCOUNTER — Ambulatory Visit: Payer: Self-pay | Admitting: Cardiology

## 2022-06-08 VITALS — Resp 16 | Ht 73.0 in | Wt 225.0 lb

## 2022-06-08 DIAGNOSIS — I255 Ischemic cardiomyopathy: Secondary | ICD-10-CM

## 2022-06-08 DIAGNOSIS — I2510 Atherosclerotic heart disease of native coronary artery without angina pectoris: Secondary | ICD-10-CM | POA: Diagnosis not present

## 2022-06-08 DIAGNOSIS — I5020 Unspecified systolic (congestive) heart failure: Secondary | ICD-10-CM

## 2022-06-08 DIAGNOSIS — R002 Palpitations: Secondary | ICD-10-CM

## 2022-06-08 NOTE — Progress Notes (Signed)
Patient is here for follow up visit.  Subjective:   Bobby [name], male    DOB: 02-06-1971, 51 y.o.   MRN: [id number]   Chief Complaint  Patient presents with   heart failure with reduced ejection fraction    HPI   51 y/o Caucasian male with CAD, HFrEF  Patient reports episodes of palpitations particularly in the hours.  He denies any chest pain, shortness of breath symptoms.  He is compliant with medical therapy.  He currently wears an Apple Watch, but has not noticed any notifications about arrhythmia.   Current Outpatient Medications:    aspirin 81 MG chewable tablet, Chew 1 tablet (81 mg total) by mouth daily., Disp: 30 tablet, Rfl: 11   atorvastatin (LIPITOR) 20 MG tablet, TAKE 1 TABLET BY MOUTH DAILY., Disp: 90 tablet, Rfl: 0   carvedilol (COREG) 3.125 MG tablet, Take 1 tablet (3.125 mg total) by mouth 2 (two) times daily., Disp: 180 tablet, Rfl: 3   dapagliflozin propanediol (FARXIGA) 10 MG TABS tablet, Take 1 tablet (10 mg total) by mouth daily before breakfast., Disp: 90 tablet, Rfl: 2   Evolocumab (REPATHA SURECLICK) 031 MG/ML SOAJ, inject 137m every 14 days, Disp: 6 mL, Rfl: 4   ezetimibe (ZETIA) 10 MG tablet, TAKE 1 TABLET (10 MG TOTAL) BY MOUTH DAILY., Disp: 90 tablet, Rfl: 0   FLUoxetine (PROZAC) 20 MG capsule, Take one pill per day, Disp: 90 capsule, Rfl: 3   ivabradine (CORLANOR) 5 MG TABS tablet, Take 1 tablet (5 mg total) by mouth 2 (two) times daily with a meal., Disp: 180 tablet, Rfl: 1   levothyroxine (SYNTHROID) 137 MCG tablet, Take 1 tablet (137 mcg total) by mouth daily., Disp: 30 tablet, Rfl: 5   nitroGLYCERIN (NITROSTAT) 0.4 MG SL tablet, PLACE 1 TABLET UNDER THE TONGUE EVERY 5 MINUTES AS NEEDED FOR CHEST PAIN., Disp: 25 tablet, Rfl: 2   pantoprazole (PROTONIX) 40 MG tablet, TAKE 1 TABLET (40 MG TOTAL) BY MOUTH DAILY., Disp: 90 tablet, Rfl: 3   sacubitril-valsartan (ENTRESTO) 24-26 MG, Take 1 tablet by mouth 2 (two) times daily., Disp: 180 tablet, Rfl:  3   sildenafil (VIAGRA) 100 MG tablet, Take 1 tablet (100 mg total) by mouth as needed for erectile dysfunction. TAKE 1 (ONE) TABLET AS NEEDED. DO NOT USE AT SAME TIME AS NITRO, Disp: 30 tablet, Rfl: 3   spironolactone (ALDACTONE) 25 MG tablet, Take 1 tablet (25 mg total) by mouth daily. Please disregard duplicate prescription of 90 pills, Disp: 90 tablet, Rfl: 3  Cardiovascular studies:  EKG 06/08/2022: Sinus rhythm 64 bpm  Left anterior fascicular block Old anterior infarct  Coronary angiography 08/10/2020: LM: Normal LAD: Patent ostial LAD stent. 20% ostial diagonal stenosis LCx: 20% OM1 stenosis RCA: Prox in-stent CTO. Left-to-left collaterals fill up to mid RCA Normal LVEDP   Echocardiogram 07/16/2020:  Poor visualization of endocardial borders.  Left ventricle cavity is  normal in size and thickness. Anterior/anteroseptal akinesis. LVEF  probably 40-45%.  Indeterminate diastolic filling pattern.  No significant valvular abnormalities.  normal right atrial pressure.  No significant change compared to previous study in 2018.  Real-time Outpatient Cardiac Telemetry 04/01/2020 - 04/15/2020: Dominant rhythm: Sinus. HR 44-150 bpm. Avg HR 72 bpm. Supraventricular ectopy, PAC/upto 6 beat run. SVE burden <1% Ventricular ectopy, PVC, NSVT upto 5 beats. VE burden <1% No atrial fibrillation/atrial flutter/SVT/high grade AV block, sinus pause >3sec noted. 12 patient activated events do not correlate with arrhthymias.   Stress perfusion MRI 11/07/2017: 1. Moderately  dilated left ventricle with normal wall thickness and moderately decreased systolic function (LVEF = 43%). There is hypokinesis of the basal anteroseptal, anterior walls and akinesis of the mid inferoseptal, anteroseptal, anterior, all apical walls including true apex. A large infarct in the basal and mid inferior, inferoseptal, anteroseptal, anterior walls and in all the apical walls with no ischemia. 2. Normal right  ventricular size, thickness and systolic function (LVEF = 56%). There are no regional wall motion abnormalities. 3.  Mildly dilated left atrium. 4. Mild mitral and tricuspid regurgitation. Collectively, these findings are consistent with an ischemic cardiomyopathy with a large infarct in the LAD and RCA territory with no peri-infarct ischemia. There is poor chance of recovery if Revascularized.  Cath 08/21/2017: LM: Normal LAD: Patent ostial LAD stent. 20% ostial diagonal stenosis Ramus: 50% proximal stenosis RCA: Not engaged today. Known RCA CTO   Elevated LVEDP LVEF 45%   Continue aggressive medical therapy  Coronary angiogram 07/13/2017: Elevated LVEDP at 24 mmHg, systemic pressures 80/60 mmHg, patient in cardiogenic shock. Left main normal, LAD flush occluded. Ramus intermediate mid 70-80% % stenosis, mild disease in the circumflex. RCA occluded at the prior angioplasty site. Contralateral collaterals from the LAD.    Large LAD and diagonal, successful angioplasty and stenting following thrombectomy with implantation of 3.5 x 26 mm resolute Onyx DES deployed at 14 atmospheric pressure. Diagonal 1 ostial stenosis balloon angioplasty performed with 2.5 mm balloon at 10 atmospheric pressure. Stenosis reduced to 0%, mid flow improved from 0-3.  Carotid artery duplex 10/05/2017: No hemodynamically significant arterial disease in the internal carotid artery bilaterally. Normal study.  Antegrade right vertebral artery flow. Antegrade left vertebral artery flow.  Recent labs: 04/25/2021: Glucose 113, BUN/Cr 13/1.47. EGFR 57. Na/K 141/4.2.  NT pro BNP 347  01/06/2021: Glucose 102, BUN/Cr 20/1.35. EGFR 60. Na/K 140/5.1.   11/25/2020: Glucose 104, BUN/Cr 19/1.31. EGFR 63. Na/K 141/5.1.   11/16/2020: Glucose 129, BUN/Cr 13/1.09. EGFR 79. Na/K 140/4.3. Rest of the CMP normal H/H 14/41. MCV 91. Platelets 176 HbA1C 6.1 % Chol 92, TG 77, HDL 61, LDL 17 TSH 0.8 normal  Review of Systems   Constitutional: Negative for malaise/fatigue.  Cardiovascular:  Positive for dyspnea on exertion. Negative for chest pain, leg swelling, palpitations and syncope.  Gastrointestinal:  Positive for bloating.       Objective:    Vitals:   06/08/22 1646  Resp: 16      Physical Exam Vitals and nursing note reviewed.  Constitutional:      Appearance: He is well-developed.  Neck:     Vascular: No JVD.  Cardiovascular:     Rate and Rhythm: Normal rate and regular rhythm.     Pulses: Intact distal pulses.     Heart sounds: Normal heart sounds. No murmur heard. Pulmonary:     Effort: Pulmonary effort is normal.     Breath sounds: Normal breath sounds. No wheezing or rales.  Abdominal:     Comments: Generalized abdominal tenderness  Musculoskeletal:     Right lower leg: No edema.     Left lower leg: No edema.         Assessment & Recommendations:   51 y/o Caucasian male with CAD, HFrEF  Palpitations: Will enroll in Cardiologs monitoring.  Ischemic cardiomyopathy: HFrEF, stable Continue spironolactone 25 mg daily, carvedilol 3.125 mg twice daily, Entresto 24-26 mg twice daily, ivabradine 5 mg twice daily,Farxiga 10 mg daily. Take torsemide 20 mg only as needed.   CAD: H/o anterior MI and  LAD revascularization.  Known RCA CTO with no viability in RCA territory on MRI. Continue aspirin 81, Lipitor 20, bisoprolol, Repatha.  F/u in 6 months  Anthonella Klausner Esther Hardy, MD [facility] Pager: [phone] Office: [phone] If no answer Cell [phone]

## 2022-06-09 ENCOUNTER — Encounter: Payer: Self-pay | Admitting: Cardiology

## 2022-06-14 ENCOUNTER — Other Ambulatory Visit: Payer: Self-pay

## 2022-06-14 MED ORDER — LEVOTHYROXINE SODIUM 137 MCG PO TABS
ORAL_TABLET | ORAL | 5 refills | Status: DC
  Filled 2022-06-14: qty 30, 30d supply, fill #0
  Filled 2022-07-12: qty 30, 30d supply, fill #1
  Filled 2022-08-14: qty 30, 30d supply, fill #2
  Filled 2022-09-12: qty 30, 30d supply, fill #3
  Filled 2022-10-08: qty 30, 30d supply, fill #4
  Filled 2022-11-08: qty 30, 30d supply, fill #5

## 2022-06-17 ENCOUNTER — Other Ambulatory Visit: Payer: Self-pay | Admitting: Cardiology

## 2022-06-17 DIAGNOSIS — I5020 Unspecified systolic (congestive) heart failure: Secondary | ICD-10-CM

## 2022-06-19 ENCOUNTER — Other Ambulatory Visit: Payer: Self-pay

## 2022-06-19 MED ORDER — ATORVASTATIN CALCIUM 20 MG PO TABS
ORAL_TABLET | Freq: Every day | ORAL | 0 refills | Status: DC
  Filled 2022-06-19: qty 90, 90d supply, fill #0

## 2022-06-19 MED ORDER — IVABRADINE HCL 5 MG PO TABS
5.0000 mg | ORAL_TABLET | Freq: Two times a day (BID) | ORAL | 1 refills | Status: DC
  Filled 2022-06-19: qty 180, 90d supply, fill #0
  Filled 2022-09-25: qty 180, 90d supply, fill #1

## 2022-06-22 ENCOUNTER — Encounter: Payer: Self-pay | Admitting: Cardiology

## 2022-06-22 NOTE — Telephone Encounter (Signed)
From patient.

## 2022-07-06 ENCOUNTER — Other Ambulatory Visit: Payer: Self-pay

## 2022-07-06 MED ORDER — AMOXICILLIN 500 MG PO CAPS
500.0000 mg | ORAL_CAPSULE | Freq: Three times a day (TID) | ORAL | 0 refills | Status: DC
  Filled 2022-07-06: qty 24, 8d supply, fill #0

## 2022-07-06 MED ORDER — METRONIDAZOLE 500 MG PO TABS
ORAL_TABLET | ORAL | 0 refills | Status: DC
  Filled 2022-07-06: qty 16, 8d supply, fill #0

## 2022-07-07 ENCOUNTER — Other Ambulatory Visit: Payer: Self-pay

## 2022-07-12 ENCOUNTER — Other Ambulatory Visit: Payer: Self-pay | Admitting: Cardiology

## 2022-07-12 ENCOUNTER — Other Ambulatory Visit: Payer: Self-pay

## 2022-07-12 MED ORDER — NITROGLYCERIN 0.4 MG SL SUBL
SUBLINGUAL_TABLET | SUBLINGUAL | 2 refills | Status: DC
  Filled 2022-07-12: qty 25, fill #0
  Filled 2022-07-12: qty 25, 30d supply, fill #0
  Filled 2022-08-14: qty 25, 8d supply, fill #1
  Filled 2022-10-16: qty 25, 8d supply, fill #2

## 2022-07-12 MED ORDER — ATORVASTATIN CALCIUM 20 MG PO TABS
ORAL_TABLET | Freq: Every day | ORAL | 0 refills | Status: DC
  Filled 2022-07-12: qty 90, fill #0
  Filled 2022-09-25: qty 90, 90d supply, fill #0

## 2022-07-12 MED ORDER — PANTOPRAZOLE SODIUM 40 MG PO TBEC
DELAYED_RELEASE_TABLET | Freq: Every day | ORAL | 3 refills | Status: DC
  Filled 2022-07-12: qty 90, fill #0
  Filled 2022-07-12: qty 90, 90d supply, fill #0
  Filled 2022-10-02: qty 90, 90d supply, fill #1
  Filled 2022-12-31: qty 90, 90d supply, fill #2
  Filled 2023-04-01: qty 90, 90d supply, fill #3

## 2022-07-13 ENCOUNTER — Other Ambulatory Visit: Payer: Self-pay

## 2022-07-26 ENCOUNTER — Ambulatory Visit (INDEPENDENT_AMBULATORY_CARE_PROVIDER_SITE_OTHER): Payer: 59 | Admitting: Cardiovascular Disease

## 2022-07-26 ENCOUNTER — Encounter: Payer: Self-pay | Admitting: Cardiovascular Disease

## 2022-07-26 ENCOUNTER — Other Ambulatory Visit
Admission: RE | Admit: 2022-07-26 | Discharge: 2022-07-26 | Disposition: A | Discharge status: Home / Self Care | Payer: 59 | Source: Ambulatory Visit | Attending: Cardiovascular Disease | Admitting: Cardiovascular Disease

## 2022-07-26 VITALS — BP 108/64 | HR 62 | Ht 73.0 in | Wt 214.2 lb

## 2022-07-26 DIAGNOSIS — E039 Hypothyroidism, unspecified: Secondary | ICD-10-CM | POA: Diagnosis not present

## 2022-07-26 DIAGNOSIS — I25118 Atherosclerotic heart disease of native coronary artery with other forms of angina pectoris: Secondary | ICD-10-CM

## 2022-07-26 DIAGNOSIS — I208 Other forms of angina pectoris: Secondary | ICD-10-CM

## 2022-07-26 DIAGNOSIS — E349 Endocrine disorder, unspecified: Secondary | ICD-10-CM | POA: Diagnosis not present

## 2022-07-26 DIAGNOSIS — I252 Old myocardial infarction: Secondary | ICD-10-CM

## 2022-07-26 DIAGNOSIS — E782 Mixed hyperlipidemia: Secondary | ICD-10-CM | POA: Diagnosis not present

## 2022-07-26 DIAGNOSIS — I255 Ischemic cardiomyopathy: Secondary | ICD-10-CM | POA: Diagnosis not present

## 2022-07-26 LAB — COMPREHENSIVE METABOLIC PANEL
ALT: 17 U/L (ref 0–44)
AST: 18 U/L (ref 15–41)
Albumin: 4.3 g/dL (ref 3.5–5.0)
Alkaline Phosphatase: 79 U/L (ref 38–126)
Anion gap: 7 (ref 5–15)
BUN: 11 mg/dL (ref 6–20)
CO2: 25 mmol/L (ref 22–32)
Calcium: 9.6 mg/dL (ref 8.9–10.3)
Chloride: 111 mmol/L (ref 98–111)
Creatinine, Ser: 1.15 mg/dL (ref 0.61–1.24)
GFR, Estimated: >60 mL/min (ref >60)
Glucose, Bld: 119 mg/dL — ABNORMAL HIGH (ref 70–99)
Potassium: 4.9 mmol/L (ref 3.5–5.1)
Sodium: 143 mmol/L (ref 135–145)
Total Bilirubin: 0.9 mg/dL (ref 0.3–1.2)
Total Protein: 7 g/dL (ref 6.5–8.1)

## 2022-07-26 LAB — CBC
HCT: 49.1 % (ref 39.0–52.0)
Hemoglobin: 16.5 g/dL (ref 13.0–17.0)
MCH: 30.8 pg (ref 26.0–34.0)
MCHC: 33.6 g/dL (ref 30.0–36.0)
MCV: 91.6 fL (ref 80.0–100.0)
Platelets: 226 10*3/uL (ref 150–400)
RBC: 5.36 MIL/uL (ref 4.22–5.81)
RDW: 14.1 % (ref 11.5–15.5)
WBC: 12.2 10*3/uL — ABNORMAL HIGH (ref 4.0–10.5)
nRBC: 0 % (ref 0.0–0.2)

## 2022-07-26 LAB — VITAMIN B12: Vitamin B-12: 281 pg/mL (ref 180–914)

## 2022-07-26 LAB — VITAMIN D 25 HYDROXY (VIT D DEFICIENCY, FRACTURES): Vit D, 25-Hydroxy: 38.56 ng/mL (ref 30–100)

## 2022-07-26 LAB — HEMOGLOBIN A1C
Hgb A1c MFr Bld: 6.3 % — ABNORMAL HIGH (ref 4.8–5.6)
Mean Plasma Glucose: 134.11 mg/dL

## 2022-07-26 LAB — LIPID PANEL
Cholesterol: 74 mg/dL (ref 0–200)
HDL: 39 mg/dL — ABNORMAL LOW (ref >40)
LDL Cholesterol: 24 mg/dL (ref 0–99)
Total CHOL/HDL Ratio: 1.9 RATIO
Triglycerides: 54 mg/dL (ref <150)
VLDL: 11 mg/dL (ref 0–40)

## 2022-07-26 LAB — TSH: TSH: 0.773 u[IU]/mL (ref 0.350–4.500)

## 2022-07-26 LAB — T4, FREE: Free T4: 0.98 ng/dL (ref 0.61–1.12)

## 2022-07-26 NOTE — Patient Instructions (Addendum)
Medication Instructions:  No changes  If you need a refill on your cardiac medications before your next appointment, please call your pharmacy.   Lab work: Today: CMP, lipids, A1C, CBC, free testosterone, B12, Vit D, TSH, free T4  [facility] Mall Entrance at [facility] 1st desk on the right to check in (REGISTRATION)  Lab hours: Monday- Friday (7:30 am- 5:30 pm)   Testing/Procedures: [place]  Your caregiver has ordered a Stress Test with nuclear imaging. The purpose of this test is to evaluate the blood supply to your heart muscle. This procedure is referred to as a "Non-Invasive Stress Test." This is because other than having an IV started in your vein, nothing is inserted or "invades" your body. Cardiac stress tests are done to find areas of poor blood flow to the heart by determining the extent of coronary artery disease (CAD). Some patients exercise on a treadmill, which naturally increases the blood flow to your heart, while others who are  unable to walk on a treadmill due to physical limitations have a pharmacologic/chemical stress agent called Lexiscan . This medicine will mimic walking on a treadmill by temporarily increasing your coronary blood flow.   Please note: these test may take anywhere between 2-4 hours to complete  PLEASE REPORT TO [place] AT THE FIRST DESK WILL DIRECT YOU WHERE TO GO  Date of Procedure:_____________________________________  Arrival Time for Procedure:______________________________   PLEASE NOTIFY THE OFFICE AT LEAST 24 HOURS IN ADVANCE IF YOU ARE UNABLE TO KEEP YOUR APPOINTMENT.  [phone] AND  PLEASE NOTIFY NUCLEAR MEDICINE AT [facility] AT LEAST 24 HOURS IN ADVANCE IF YOU ARE UNABLE TO KEEP YOUR APPOINTMENT. [phone]  How to prepare for your Myoview test:  Do not eat or drink after midnight No caffeine for 24 hours prior to test No smoking 24 hours prior to test. Your medication may be taken with water.  If  your doctor stopped a medication because of this test, do not take that medication. Ladies, please do not wear dresses.  Skirts or pants are appropriate. Please wear a short sleeve shirt. No perfume, cologne or lotion.  Follow-Up: At [facility], you and your health needs are our priority.  As part of our continuing mission to provide you with exceptional heart care, we have created designated Provider Care Teams.  These Care Teams include your primary Cardiologist (physician) and Advanced Practice Providers (APPs -  Physician Assistants and Nurse Practitioners) who all work together to provide you with the care you need, when you need it.  You will need a follow up appointment in 6 months  Providers on your designated Care Team:   Murray Hodgkins, NP Christell Faith, PA-C Cadence Kathlen Mody, [place]  COVID-19 Vaccine Information can be found at: [email or web address] For questions related to vaccine distribution or appointments, please email [email or web address] or call [phone].

## 2022-07-26 NOTE — Progress Notes (Signed)
[facility] Office Note  Date:  07/26/2022   ID:  Bobby [name], DOB 1971/05/08, MRN [id number]  PCP:  Aletha Halim., PA-C   Chief Complaint  Patient presents with   New Patient (Initial Visit)    Patient c/o chest pain, palpitations, shortness of breath, heaviness in face, legs and arms from the elbows down to fingertips. Medications reviewed by the patient verbally.     HPI:  Bobby [name] is a 51 year old gentleman with past medical history of Hypotestosterone Hypothyroid, on supplement Hx of smoking CAD, inferior wall MI Occluded RCA with collaterals, Anterior wall STEMI July 13, 2017, status post anterior wall MI 06/2017 s/p successful LAD revascularization  Previously seen by cardiology June 08, 2022 [facility] in [place]  Prior cardiac history reviewed, remote history of stenting x2 to the RCA 2003,  Cardiac catheterization July 13, 2017 noted to have occluded RCA, as well as 100% proximal LAD disease, 80% diagonal disease, 70% OM disease collaterals from left to right  Repeat catheterization August 21, 2017, patent LAD stent, occluded proximal RCA in-stent restenosis, 50% ramus disease  On discussion today, reports that he stays tired, chronic fatigue, arms tired, episodes of diaphoresis Off coreg 2 weeks, Felt well for one week, put himself back on the carvedilol when heart rate and blood pressure started trending upwards  Reports having episodes of stomach issues, rare, vomiting, GI distress, etiology unclear  hospitalized in early July 2021 with nausea, vomiting, and diarrhea. Patient underwent EGD that showed erythematous mucosa in stomach antrum, and duodenum. Biopsy was taken. Prontonix and outpatient f/u were recommended. Pathology showed no malignancy.   No regular exercise program  EKG personally reviewed by myself on todays visit Normal sinus rhythm rate 62 bpm old anterior MI, left axis deviation  Cardiac testing and imaging as  below Coronary angiography 08/10/2020: LM: Normal LAD: Patent ostial LAD stent. 20% ostial diagonal stenosis LCx: 20% OM1 stenosis RCA: Prox in-stent CTO. Left-to-left collaterals fill up to mid RCA Normal LVEDP   Echocardiogram 07/16/2020:  Poor visualization of endocardial borders.  Left ventricle cavity is  normal in size and thickness. Anterior/anteroseptal akinesis. LVEF  probably 40-45%.  Indeterminate diastolic filling pattern.  No significant valvular abnormalities.  normal right atrial pressure.  No significant change compared to previous study in 2018.   Real-time Outpatient Cardiac Telemetry 04/01/2020 - 04/15/2020: Dominant rhythm: Sinus. HR 44-150 bpm. Avg HR 72 bpm. Supraventricular ectopy, PAC/upto 6 beat run. SVE burden <1% Ventricular ectopy, PVC, NSVT upto 5 beats. VE burden <1% No atrial fibrillation/atrial flutter/SVT/high grade AV block, sinus pause >3sec noted. 12 patient activated events do not correlate with arrhthymias.    Stress perfusion MRI 11/07/2017: 1. Moderately dilated left ventricle with normal wall thickness and moderately decreased systolic function (LVEF = 43%). There is hypokinesis of the basal anteroseptal, anterior walls and akinesis of the mid inferoseptal, anteroseptal, anterior, all apical walls including true apex. A large infarct in the basal and mid inferior, inferoseptal, anteroseptal, anterior walls and in all the apical walls with no ischemia. 2. Normal right ventricular size, thickness and systolic function (LVEF = 56%). There are no regional wall motion abnormalities. 3.  Mildly dilated left atrium. 4. Mild mitral and tricuspid regurgitation. Collectively, these findings are consistent with an ischemic cardiomyopathy with a large infarct in the LAD and RCA territory with no peri-infarct ischemia. There is poor chance of recovery if Revascularized.   Cath 08/21/2017: LM: Normal LAD: Patent ostial LAD stent. 20% ostial diagonal  stenosis Ramus: 50% proximal stenosis RCA: Not engaged today. Known RCA CTO   Elevated LVEDP LVEF 45%   PMH:   has a past medical history of Acute MI anterior wall first episode care [facility]) (07/13/2017), Cardiogenic shock ([place]) (07/13/2017), CHF (congestive heart failure) ([place]), Graves disease, Hypertension, MI (myocardial infarction) ([place]), and Thyroid disease.  PSH:    Past Surgical History:  Procedure Laterality Date   CORONARY ANGIOPLASTY WITH STENT PLACEMENT     CORONARY/GRAFT ACUTE MI REVASCULARIZATION N/A 07/13/2017   Procedure: Coronary/Graft Acute MI Revascularization;  Surgeon: Adrian Prows, MD;  Location: [facility];  Service: Cardiovascular;  Laterality: N/A;   ESOPHAGOGASTRODUODENOSCOPY N/A 06/30/2020   Procedure: ESOPHAGOGASTRODUODENOSCOPY (EGD);  Surgeon: Toledo, Benay Pike, MD;  Location: [facility];  Service: Gastroenterology;  Laterality: N/A;   LEFT HEART CATH AND CORONARY ANGIOGRAPHY N/A 07/13/2017   Procedure: Left Heart Cath and Coronary Angiography;  Surgeon: Adrian Prows, MD;  Location: [facility];  Service: Cardiovascular;  Laterality: N/A;   LEFT HEART CATH AND CORONARY ANGIOGRAPHY N/A 08/21/2017   Procedure: LEFT HEART CATH AND CORONARY ANGIOGRAPHY;  Surgeon: Nigel Mormon, MD;  Location: [facility];  Service: Cardiovascular;  Laterality: N/A;   LEFT HEART CATH AND CORONARY ANGIOGRAPHY N/A 08/10/2020   Procedure: LEFT HEART CATH AND CORONARY ANGIOGRAPHY;  Surgeon: Nigel Mormon, MD;  Location: [facility];  Service: Cardiovascular;  Laterality: N/A;   SKIN GRAFT     VENTRICULAR ASSIST DEVICE INSERTION N/A 07/13/2017   Procedure: Ventricular Assist Device Insertion;  Surgeon: Adrian Prows, MD;  Location: [facility];  Service: Cardiovascular;  Laterality: N/A;    Current Outpatient Medications  Medication Sig Dispense Refill   aspirin 81 MG chewable tablet Chew 1 tablet (81 mg total) by mouth daily. 30 tablet 11    atorvastatin (LIPITOR) 20 MG tablet TAKE 1 TABLET BY MOUTH DAILY. 90 tablet 0   carvedilol (COREG) 3.125 MG tablet Take 1 tablet (3.125 mg total) by mouth 2 (two) times daily. 180 tablet 3   dapagliflozin propanediol (FARXIGA) 10 MG TABS tablet Take 1 tablet (10 mg total) by mouth daily before breakfast. 90 tablet 2   Evolocumab (REPATHA SURECLICK) 161 MG/ML SOAJ inject '140mg'$  every 14 days 6 mL 4   ezetimibe (ZETIA) 10 MG tablet TAKE 1 TABLET (10 MG TOTAL) BY MOUTH DAILY. 90 tablet 0   FLUoxetine (PROZAC) 20 MG capsule Take one pill per day 90 capsule 3   ivabradine (CORLANOR) 5 MG TABS tablet Take 1 tablet (5 mg total) by mouth 2 (two) times daily with a meal. 180 tablet 1   levothyroxine (SYNTHROID) 137 MCG tablet Take 1 tablet (137 mcg total) by mouth daily. 30 tablet 5   nitroGLYCERIN (NITROSTAT) 0.4 MG SL tablet PLACE 1 TABLET UNDER THE TONGUE EVERY 5 MINUTES AS NEEDED FOR CHEST PAIN. 25 tablet 2   pantoprazole (PROTONIX) 40 MG tablet TAKE 1 TABLET (40 MG TOTAL) BY MOUTH DAILY. 90 tablet 3   sacubitril-valsartan (ENTRESTO) 24-26 MG Take 1 tablet by mouth 2 (two) times daily. 180 tablet 3   sildenafil (VIAGRA) 100 MG tablet Take 1 tablet (100 mg total) by mouth as needed for erectile dysfunction. TAKE 1 (ONE) TABLET AS NEEDED. DO NOT USE AT SAME TIME AS NITRO 30 tablet 3   spironolactone (ALDACTONE) 25 MG tablet Take 1 tablet (25 mg total) by mouth daily. Please disregard duplicate prescription of 90 pills 90 tablet 3   amoxicillin (AMOXIL)  500 MG capsule Take 1 capsule by mouth three times daily until gone. (Patient not taking: Reported on 07/26/2022) 24 capsule 0   metroNIDAZOLE (FLAGYL) 500 MG tablet Take one BID until gone, NO ALCOHOL (Patient not taking: Reported on 07/26/2022) 16 tablet 0   No current facility-administered medications for this visit.    Allergies:   Patient has no known allergies.   Social History:  The patient  reports that he quit smoking about 5 years ago. His smoking  use included cigarettes. He has a 10.00 pack-year smoking history. He has never used smokeless tobacco. He reports current alcohol use. He reports current drug use. Drug: Marijuana.   Family History:   family history includes Heart disease in his paternal grandmother; Heart disease (age of onset: 23) in his father; Hypertension in his father; Valvular heart disease in his mother.    Review of Systems: Review of Systems  Constitutional:  Positive for malaise/fatigue.  HENT: Negative.    Respiratory:  Positive for shortness of breath.   Cardiovascular:  Positive for chest pain.       Diaphoresis  Gastrointestinal: Negative.   Musculoskeletal: Negative.   Neurological: Negative.   Psychiatric/Behavioral: Negative.    All other systems reviewed and are negative.   PHYSICAL EXAM: VS:  BP 108/64 (BP Location: Left Arm, Patient Position: Sitting, Cuff Size: Normal)   Pulse 62   Ht '6\' 1"'$  (1.854 m)   Wt 214 lb 4 oz (97.2 kg)   SpO2 98%   BMI 28.27 kg/m  , BMI Body mass index is 28.27 kg/m. GEN: Well nourished, well developed, in no acute distress HEENT: normal Neck: no JVD, carotid bruits, or masses Cardiac: RRR; no murmurs, rubs, or gallops,no edema  Respiratory:  clear to auscultation bilaterally, normal work of breathing GI: soft, nontender, nondistended, + BS MS: no deformity or atrophy Skin: warm and dry, no rash Neuro:  Strength and sensation are intact Psych: euthymic mood, full affect   Recent Labs: No results found for requested labs within last 365 days.    Lipid Panel Lab Results  Component Value Date   CHOL 97 06/30/2020   HDL 32 (L) 06/30/2020   LDLCALC 46 06/30/2020   TRIG 96 06/30/2020      Wt Readings from Last 3 Encounters:  07/26/22 214 lb 4 oz (97.2 kg)  06/08/22 225 lb (102.1 kg)  11/29/21 225 lb (102.1 kg)      ASSESSMENT AND PLAN:  Problem List Items Addressed This Visit       Cardiology Problems   HLD (hyperlipidemia)   CAD (coronary  artery disease) - Primary   Ischemic cardiomyopathy     Other   Hypothyroidism   Other Visit Diagnoses     Hypotestosteronemia       History of acute inferior wall MI          Coronary artery disease with stable angina Remote stenting to the RCA, noted to be occluded on catheterization 2018 with collaterals from left to right STEMI with ostial/proximal LAD occlusion, stent placed 2018 Mildly reduced ejection fraction on echocardiogram Currently with chronic fatigue, feels tired, arms heavy, episodes of diaphoresis, has not felt well for about 1 year No recent ischemic work-up, After further discussion we have ordered Lexiscan Myoview for further evaluation On aspirin, not on dual antiplatelets Cholesterol at goal On goal-directed medical therapy For chronic stable angina also recommend he consider cardiac rehab  Ischemic cardiomyopathy/chronic systolic CHF Mildly depressed ejection fraction on echo following  prior MI We will continue low-dose carvedilol, spironolactone, Entresto, Farxiga Blood pressure and heart rate stable -Recommend he consider cardiac rehab  Chronic fatigue/poor energy, diaphoresis Lab work ordered including thyroid, testosterone, B12, vitamin D Ischemic work-up as above Reports having testosterone supplement in the past, currently not on any supplement  Hyperlipidemia Numbers at goal on PCSK9 inhibitor, Repatha   Total encounter time more than 60 minutes  Greater than 50% was spent in counseling and coordination of care with the patient    Signed, Esmond Plants, M.D., Ph.D. [place], [place]

## 2022-07-28 NOTE — Addendum Note (Signed)
Addended by: Anselm Pancoast on: 07/28/2022 03:18 PM   Modules accepted: Orders

## 2022-07-30 ENCOUNTER — Other Ambulatory Visit: Payer: Self-pay | Admitting: Cardiology

## 2022-07-31 ENCOUNTER — Other Ambulatory Visit: Payer: Self-pay

## 2022-07-31 MED ORDER — EZETIMIBE 10 MG PO TABS
ORAL_TABLET | Freq: Every day | ORAL | 0 refills | Status: DC
  Filled 2022-07-31: qty 90, 90d supply, fill #0

## 2022-08-01 ENCOUNTER — Ambulatory Visit
Admission: RE | Admit: 2022-08-01 | Discharge: 2022-08-01 | Disposition: A | Discharge status: Home / Self Care | Payer: 59 | Source: Ambulatory Visit | Attending: Cardiovascular Disease | Admitting: Cardiovascular Disease

## 2022-08-01 DIAGNOSIS — I25118 Atherosclerotic heart disease of native coronary artery with other forms of angina pectoris: Secondary | ICD-10-CM | POA: Insufficient documentation

## 2022-08-01 DIAGNOSIS — I208 Other forms of angina pectoris: Secondary | ICD-10-CM | POA: Insufficient documentation

## 2022-08-01 MED ORDER — REGADENOSON 0.4 MG/5ML IV SOLN
0.4000 mg | Freq: Once | INTRAVENOUS | Status: AC
Start: 2022-08-01 — End: 2022-08-01
  Administered 2022-08-01: 0.4 mg via INTRAVENOUS

## 2022-08-01 MED ORDER — TECHNETIUM TC 99M TETROFOSMIN IV KIT
10.5600 | PACK | Freq: Once | INTRAVENOUS | Status: AC | PRN
  Administered 2022-08-01: 10.56 via INTRAVENOUS

## 2022-08-01 MED ORDER — TECHNETIUM TC 99M TETROFOSMIN IV KIT
31.7800 | PACK | Freq: Once | INTRAVENOUS | Status: AC | PRN
  Administered 2022-08-01: 31.78 via INTRAVENOUS

## 2022-08-02 LAB — NM MYOCAR MULTI W/SPECT W/WALL MOTION / EF
LV dias vol: 168 mL (ref 62–150)
LV sys vol: 97 mL
Nuc Stress EF: 42 %
Peak HR: 77 {beats}/min
Percent HR: 45 %
Rest HR: 57 {beats}/min
Rest Nuclear Isotope Dose: 10.6 mCi
SDS: 0
SRS: 37
SSS: 29
ST Depression (mm): 0 mm
Stress Nuclear Isotope Dose: 31.8 mCi
TID: 1.31

## 2022-08-02 LAB — TESTOSTERONE, FREE: Testosterone, Free: 8.9 pg/mL (ref 7.2–24.0)

## 2022-08-08 ENCOUNTER — Telehealth: Payer: Self-pay | Admitting: Emergency Medicine

## 2022-08-08 NOTE — Telephone Encounter (Signed)
Called patient. No answer. Left detailed message asking patient to call back or send MyChart message.

## 2022-08-08 NOTE — Telephone Encounter (Signed)
-----   Message from Minna Merritts, MD sent at 08/06/2022 11:45 AM EDT ----- Stress test Prior heart attack to front part of heart noted, no new blockages reported No indication for cardiac catheterization at this time Old heart attack has caused mildly reduced ejection fraction   Would consider cardiac rehab

## 2022-08-09 NOTE — Telephone Encounter (Signed)
Sent patient MyChart message.

## 2022-08-14 ENCOUNTER — Other Ambulatory Visit: Payer: Self-pay

## 2022-08-28 ENCOUNTER — Other Ambulatory Visit: Payer: Self-pay

## 2022-08-29 ENCOUNTER — Other Ambulatory Visit: Payer: Self-pay

## 2022-09-12 ENCOUNTER — Other Ambulatory Visit: Payer: Self-pay

## 2022-09-26 ENCOUNTER — Other Ambulatory Visit: Payer: Self-pay

## 2022-10-02 ENCOUNTER — Other Ambulatory Visit: Payer: Self-pay

## 2022-10-03 ENCOUNTER — Other Ambulatory Visit: Payer: Self-pay

## 2022-10-09 ENCOUNTER — Other Ambulatory Visit: Payer: Self-pay

## 2022-10-16 ENCOUNTER — Other Ambulatory Visit: Payer: Self-pay | Admitting: Cardiology

## 2022-10-17 ENCOUNTER — Other Ambulatory Visit: Payer: Self-pay

## 2022-10-17 MED ORDER — EZETIMIBE 10 MG PO TABS
ORAL_TABLET | Freq: Every day | ORAL | 0 refills | Status: DC
  Filled 2022-10-17: qty 90, 90d supply, fill #0

## 2022-11-02 ENCOUNTER — Other Ambulatory Visit: Payer: Self-pay

## 2022-11-08 ENCOUNTER — Other Ambulatory Visit: Payer: Self-pay

## 2022-11-28 ENCOUNTER — Other Ambulatory Visit: Payer: Self-pay

## 2022-11-30 ENCOUNTER — Other Ambulatory Visit: Payer: Self-pay

## 2022-12-05 ENCOUNTER — Other Ambulatory Visit: Payer: Self-pay

## 2022-12-05 MED ORDER — LEVOTHYROXINE SODIUM 137 MCG PO TABS
137.0000 ug | ORAL_TABLET | Freq: Every day | ORAL | 5 refills | Status: DC
  Filled 2022-12-05: qty 30, 30d supply, fill #0
  Filled 2022-12-29: qty 30, 30d supply, fill #1
  Filled 2023-01-30: qty 30, 30d supply, fill #2
  Filled 2023-02-28: qty 30, 30d supply, fill #3
  Filled 2023-03-28: qty 30, 30d supply, fill #4
  Filled 2023-05-03: qty 30, 30d supply, fill #5

## 2022-12-08 ENCOUNTER — Ambulatory Visit: Payer: Self-pay | Admitting: Cardiology

## 2022-12-29 ENCOUNTER — Other Ambulatory Visit: Payer: Self-pay | Admitting: Cardiology

## 2022-12-29 ENCOUNTER — Other Ambulatory Visit: Payer: Self-pay | Admitting: Cardiovascular Disease

## 2022-12-29 ENCOUNTER — Other Ambulatory Visit: Payer: Self-pay

## 2022-12-29 DIAGNOSIS — I5020 Unspecified systolic (congestive) heart failure: Secondary | ICD-10-CM

## 2022-12-29 MED ORDER — ATORVASTATIN CALCIUM 20 MG PO TABS
ORAL_TABLET | Freq: Every day | ORAL | 0 refills | Status: DC
  Filled 2022-12-29: qty 90, 90d supply, fill #0

## 2023-01-01 ENCOUNTER — Other Ambulatory Visit: Payer: Self-pay

## 2023-01-01 MED FILL — Ivabradine HCl Tab 5 MG (Base Equiv): ORAL | 90 days supply | Qty: 180 | Fill #0 | Status: AC

## 2023-01-02 ENCOUNTER — Other Ambulatory Visit: Payer: Self-pay

## 2023-01-08 ENCOUNTER — Ambulatory Visit
Admission: EM | Admit: 2023-01-08 | Discharge: 2023-01-08 | Disposition: A | Discharge status: Home / Self Care | Payer: 59 | Attending: Urgent Care | Admitting: Urgent Care

## 2023-01-08 ENCOUNTER — Other Ambulatory Visit: Payer: Self-pay

## 2023-01-08 ENCOUNTER — Encounter: Payer: Self-pay | Admitting: Emergency Medicine

## 2023-01-08 DIAGNOSIS — R6889 Other general symptoms and signs: Secondary | ICD-10-CM | POA: Diagnosis not present

## 2023-01-08 MED ORDER — BENZONATATE 100 MG PO CAPS
ORAL_CAPSULE | ORAL | 0 refills | Status: DC
  Filled 2023-01-08: qty 30, 5d supply, fill #0

## 2023-01-08 MED ORDER — HYDROCOD POLI-CHLORPHE POLI ER 10-8 MG/5ML PO SUER
5.0000 mL | Freq: Two times a day (BID) | ORAL | 0 refills | Status: AC | PRN
  Filled 2023-01-08: qty 70, 7d supply, fill #0

## 2023-01-08 NOTE — ED Triage Notes (Signed)
Symptoms started Friday.  Complains of cough, fever, headache, 101 fever today, runny nose  Has taken coricidin today

## 2023-01-08 NOTE — ED Provider Notes (Signed)
Roderic Palau    CSN: [id number] Arrival date & time: 01/08/23  1156      History   Chief Complaint Chief Complaint  Patient presents with   Cough    HPI Ezekiah Massie is a 52 y.o. male.    Cough   Presents to urgent care with report of symptoms x 3 days.  He endorses cough, fever, headache.  States fever of 101 today.  Also endorses rhinorrhea.  Taking Coricidin to treat symptoms.  Presents to clinic with elevated temp of 100.1 F.  Past Medical History:  Diagnosis Date   Acute MI anterior wall first episode care ([place]) 07/13/2017   Occluded ostial LAD S/P 3.5x26 Onyx DES. RI 80%. RCA Occluded at prior stent site with collaterals    Cardiogenic shock ([place]) 07/13/2017   CHF (congestive heart failure) ([place])    Graves disease    Hypertension    MI (myocardial infarction) ([place])    Thyroid disease    thyroid irradiated per pt    Patient Active Problem List   Diagnosis Date Noted   HFrEF (heart failure with reduced ejection fraction) ([place]) 11/22/2020   Nausea vomiting and diarrhea 06/29/2020   Abdominal pain 06/29/2020   Elevated troponin 06/29/2020   HLD (hyperlipidemia) 06/29/2020   GERD (gastroesophageal reflux disease) 06/29/2020   Hypothyroidism 06/29/2020   CAD (coronary artery disease) 06/29/2020   Myalgia 05/21/2019   CHF (congestive heart failure), NYHA class II, chronic, combined ([place]) 02/20/2019   Ischemic cardiomyopathy 02/20/2019   Bruit of right carotid artery 02/20/2019   Laboratory examination 02/20/2019   Post PTCA 07/13/2017   Unspecified hypothyroidism 08/27/2013   Tachycardia 03/15/2012   Coronary artery disease involving native coronary artery of native heart without angina pectoris 03/15/2012   HTN (hypertension) 03/15/2012   Depression with anxiety 03/15/2012    Past Surgical History:  Procedure Laterality Date   CORONARY ANGIOPLASTY WITH STENT PLACEMENT     CORONARY/GRAFT ACUTE MI REVASCULARIZATION N/A 07/13/2017   Procedure:  Coronary/Graft Acute MI Revascularization;  Surgeon: Adrian Prows, MD;  Location: [facility];  Service: Cardiovascular;  Laterality: N/A;   ESOPHAGOGASTRODUODENOSCOPY N/A 06/30/2020   Procedure: ESOPHAGOGASTRODUODENOSCOPY (EGD);  Surgeon: Toledo, Benay Pike, MD;  Location: [facility];  Service: Gastroenterology;  Laterality: N/A;   LEFT HEART CATH AND CORONARY ANGIOGRAPHY N/A 07/13/2017   Procedure: Left Heart Cath and Coronary Angiography;  Surgeon: Adrian Prows, MD;  Location: [facility];  Service: Cardiovascular;  Laterality: N/A;   LEFT HEART CATH AND CORONARY ANGIOGRAPHY N/A 08/21/2017   Procedure: LEFT HEART CATH AND CORONARY ANGIOGRAPHY;  Surgeon: Nigel Mormon, MD;  Location: [facility];  Service: Cardiovascular;  Laterality: N/A;   LEFT HEART CATH AND CORONARY ANGIOGRAPHY N/A 08/10/2020   Procedure: LEFT HEART CATH AND CORONARY ANGIOGRAPHY;  Surgeon: Nigel Mormon, MD;  Location: [facility];  Service: Cardiovascular;  Laterality: N/A;   SKIN GRAFT     VENTRICULAR ASSIST DEVICE INSERTION N/A 07/13/2017   Procedure: Ventricular Assist Device Insertion;  Surgeon: Adrian Prows, MD;  Location: [facility];  Service: Cardiovascular;  Laterality: N/A;       Home Medications    Prior to Admission medications   Medication Sig Start Date End Date Taking? Authorizing Provider  amoxicillin (AMOXIL) 500 MG capsule Take 1 capsule by mouth three times daily until gone. Patient not taking: Reported on 07/26/2022 07/06/22     aspirin 81 MG chewable tablet Chew 1 tablet (81  mg total) by mouth daily. 04/11/21   Patwardhan, Reynold Bowen, MD  atorvastatin (LIPITOR) 20 MG tablet TAKE 1 TABLET BY MOUTH DAILY. 12/29/22 12/29/23  Patwardhan, Reynold Bowen, MD  carvedilol (COREG) 3.125 MG tablet Take 1 tablet (3.125 mg total) by mouth 2 (two) times daily. 05/01/22   Patwardhan, Reynold Bowen, MD  dapagliflozin propanediol (FARXIGA) 10 MG TABS tablet Take 1 tablet (10 mg total) by mouth  daily before breakfast. 05/30/22   Patwardhan, Reynold Bowen, MD  Evolocumab (REPATHA SURECLICK) 366 MG/ML SOAJ inject '140mg'$  every 14 days 05/02/22 05/02/23  Adrian Prows, MD  ezetimibe (ZETIA) 10 MG tablet TAKE 1 TABLET (10 MG TOTAL) BY MOUTH DAILY. 10/17/22 10/17/23  Patwardhan, Reynold Bowen, MD  FLUoxetine (PROZAC) 20 MG capsule Take one pill per day 01/24/22     ivabradine (CORLANOR) 5 MG TABS tablet Take 1 tablet (5 mg total) by mouth 2 (two) times daily with a meal. 01/01/23   Gollan, Kathlene November, MD  levothyroxine (SYNTHROID) 137 MCG tablet Take 1 tablet (137 mcg total) by mouth daily. 12/05/22     metroNIDAZOLE (FLAGYL) 500 MG tablet Take one BID until gone, NO ALCOHOL Patient not taking: Reported on 07/26/2022 07/06/22     nitroGLYCERIN (NITROSTAT) 0.4 MG SL tablet PLACE 1 TABLET UNDER THE TONGUE EVERY 5 MINUTES AS NEEDED FOR CHEST PAIN. 07/12/22 07/12/23  Patwardhan, Reynold Bowen, MD  pantoprazole (PROTONIX) 40 MG tablet TAKE 1 TABLET (40 MG TOTAL) BY MOUTH DAILY. 07/12/22 07/12/23  Patwardhan, Reynold Bowen, MD  sacubitril-valsartan (ENTRESTO) 24-26 MG Take 1 tablet by mouth 2 (two) times daily. 04/10/22   Patwardhan, Reynold Bowen, MD  sildenafil (VIAGRA) 100 MG tablet Take 1 tablet (100 mg total) by mouth as needed for erectile dysfunction. TAKE 1 (ONE) TABLET AS NEEDED. DO NOT USE AT SAME TIME AS NITRO 05/01/22   Patwardhan, Manish J, MD  spironolactone (ALDACTONE) 25 MG tablet Take 1 tablet (25 mg total) by mouth daily. Please disregard duplicate prescription of 90 pills 05/01/22 01/28/23  Patwardhan, Reynold Bowen, MD  DULoxetine (CYMBALTA) 20 MG capsule Take 1 capsule (20 mg total) by mouth 2 times daily. 11/23/21 01/24/22      Family History Family History  Problem Relation Age of Onset   Valvular heart disease Mother    Heart disease Father 40       CABG   Hypertension Father    Heart disease Paternal Grandmother     Social History Social History   Tobacco Use   Smoking status: Former    Packs/day: 0.50    Years: 20.00     Total pack years: 10.00    Types: Cigarettes    Quit date: 07/13/2017    Years since quitting: 5.4   Smokeless tobacco: Never   Tobacco comments:    quit 2012  Vaping Use   Vaping Use: Never used  Substance Use Topics   Alcohol use: Yes    Comment: occ   Drug use: Yes    Types: Marijuana    Comment: 1-2 times weekly     Allergies   Patient has no known allergies.   Review of Systems Review of Systems  Respiratory:  Positive for cough.      Physical Exam Triage Vital Signs ED Triage Vitals  Enc Vitals Group     BP 01/08/23 1257 102/66     Pulse Rate 01/08/23 1257 73     Resp 01/08/23 1257 20     Temp 01/08/23 1257 100.1 F (37.8 C)  Temp Source 01/08/23 1257 Oral     SpO2 01/08/23 1257 94 %     Weight --      Height --      Head Circumference --      Peak Flow --      Pain Score 01/08/23 1254 0     Pain Loc --      Pain Edu? --      Excl. in [place]? --    No data found.  Updated Vital Signs BP 102/66 (BP Location: Left Arm)   Pulse 73   Temp 100.1 F (37.8 C) (Oral)   Resp 20   SpO2 94%   Visual Acuity Right Eye Distance:   Left Eye Distance:   Bilateral Distance:    Right Eye Near:   Left Eye Near:    Bilateral Near:     Physical Exam Vitals reviewed.  Constitutional:      Appearance: Normal appearance. He is ill-appearing.  HENT:     Nose: Congestion and rhinorrhea present.  Cardiovascular:     Rate and Rhythm: Normal rate and regular rhythm.     Pulses: Normal pulses.     Heart sounds: Normal heart sounds.  Pulmonary:     Effort: Pulmonary effort is normal.     Breath sounds: Normal breath sounds. No wheezing or rhonchi.  Skin:    General: Skin is warm and dry.  Neurological:     General: No focal deficit present.     Mental Status: He is alert and oriented to person, place, and time.  Psychiatric:        Mood and Affect: Mood normal.        Behavior: Behavior normal.      UC Treatments / Results  Labs (all labs ordered  are listed, but only abnormal results are displayed) Labs Reviewed - No data to display  EKG   Radiology No results found.  Procedures Procedures (including critical care time)  Medications Ordered in UC Medications - No data to display  Initial Impression / Assessment and Plan / UC Course  I have reviewed the triage vital signs and the nursing notes.  Pertinent labs & imaging results that were available during my care of the patient were reviewed by me and considered in my medical decision making (see chart for details).   Patient is febrile here without recent antipyretics. Satting 94% on room air. Overall is ill appearing, well hydrated, without respiratory distress. Pulmonary exam is remarkable only for severe cough triggered by deep breathing otherwise, Lungs CTAB without wheezing, rhonchi, rales.  He has rhinorrhea and nasal congestion.  Suspect acute viral process including influenza.  Unfortunately he is outside the window for antiviral treatment and so will recommend symptomatic treatment only.  Will provide benzonatate and Tussionex for cough and ask him to continue to use OTC medication for control of other symptoms.  Final Clinical Impressions(s) / UC Diagnoses   Final diagnoses:  None   Discharge Instructions   None    ED Prescriptions   None    PDMP not reviewed this encounter.   Rose Phi, [place] 01/08/23 1309

## 2023-01-08 NOTE — Discharge Instructions (Signed)
You have been diagnosed with a viral upper respiratory infection based on your symptoms and exam. Viral illnesses cannot be treated with antibiotics - they are self limiting - and you should find your symptoms resolving within a few days. Get plenty of rest and non-caffeinated fluids. Watch for signs of dehydration including reduced urine output and dark colored urine.  We recommend you use over-the-counter medications for symptom control including acetaminophen (Tylenol), ibuprofen (Advil/Motrin) or naproxen (Aleve) for throat pain, fever, chills or body aches. You may combine use of acetaminophen and ibuprofen/naproxen if needed.  Some patients find an pain-relieving throat spray such as Chloraseptic to be effective.  Also recommend cold/cough medication containing a cough suppressant such as dextromethorphan, as needed. Please note that some cough medications are not recommended if you suffer from hypertension.    Saline mist spray is helpful for removing excess mucus from your nose.  Room humidifiers are helpful to ease breathing at night. I recommend guaifenesin (Mucinex) with plenty of water throughout the day to help thin and loosen mucus secretions in your respiratory passages.   If appropriate based upon your other medical problems, you might also find relief of nasal/sinus congestion symptoms by using a nasal decongestant such as fluticasone (Flonase ) or pseudoephedrine (Sudafed sinus).  You will need to obtain Sudafed from behind the pharmacist counter.  Speak to the pharmacist to verify that you are not duplicating medications with other over-the-counter formulations that you may be using.   

## 2023-01-16 ENCOUNTER — Other Ambulatory Visit: Payer: Self-pay

## 2023-01-17 ENCOUNTER — Telehealth: Payer: BC Managed Care – PPO | Admitting: Physician Assistant

## 2023-01-17 ENCOUNTER — Other Ambulatory Visit: Payer: Self-pay

## 2023-01-17 DIAGNOSIS — J019 Acute sinusitis, unspecified: Secondary | ICD-10-CM

## 2023-01-17 DIAGNOSIS — B9689 Other specified bacterial agents as the cause of diseases classified elsewhere: Secondary | ICD-10-CM | POA: Diagnosis not present

## 2023-01-17 MED ORDER — AMOXICILLIN-POT CLAVULANATE 875-125 MG PO TABS
1.0000 | ORAL_TABLET | Freq: Two times a day (BID) | ORAL | 0 refills | Status: DC
  Filled 2023-01-17: qty 20, 10d supply, fill #0

## 2023-01-17 NOTE — Progress Notes (Signed)

## 2023-01-22 ENCOUNTER — Other Ambulatory Visit: Payer: Self-pay | Admitting: Cardiology

## 2023-01-22 ENCOUNTER — Other Ambulatory Visit: Payer: Self-pay

## 2023-01-22 MED ORDER — EZETIMIBE 10 MG PO TABS
10.0000 mg | ORAL_TABLET | Freq: Every day | ORAL | 0 refills | Status: DC
  Filled 2023-01-22: qty 90, 90d supply, fill #0

## 2023-01-30 ENCOUNTER — Other Ambulatory Visit: Payer: Self-pay

## 2023-01-30 MED ORDER — FLUOXETINE HCL 20 MG PO CAPS
20.0000 mg | ORAL_CAPSULE | Freq: Every day | ORAL | 0 refills | Status: DC
  Filled 2023-01-30: qty 90, 90d supply, fill #0

## 2023-02-18 NOTE — Progress Notes (Unsigned)
[facility] Office Note  Date:  02/19/2023   ID:  Bobby [name], DOB Apr 17, 1971, MRN [id number]  PCP:  Carollee Leitz, MD   Chief Complaint  Patient presents with   6 month follow up     Patient c/o shortness of breath at times. Medications reviewed by the patient verbally.     HPI:  Bobby [name] is a 52 year old gentleman with past medical history of Hypotestosterone Hypothyroid, on supplement Hx of smoking CAD, inferior wall MI, stenting to RCA 2003 Catheterization July 2018, occluded RCA with collaterals, Anterior wall STEMI July 13, 2017, status post anterior wall MI 06/2017 s/p successful LAD revascularization  Last seen by myself in clinic August 2023 Previously seen by cardiology June 08, 2022 [facility] in [place] feeling well overall, changing to new primary care in the next several months Works as an Chief Financial Officer Reports feeling anxious waking middle of the night several nights a week Sometimes with tremor, nervous feeling Etiology unclear, no rhyme or reason  Denies chest pain concerning for angina Tolerating Lipitor and Zetia with Repatha Cholesterol at goal  EKG personally reviewed by myself on todays visit Normal sinus rhythm rate 63 bpm consider septal MI, no ST-T wave changes  Other past medical history reviewed remote history of stenting x2 to the RCA 2003,  Cardiac catheterization July 13, 2017 noted to have occluded RCA, as well as 100% proximal LAD disease, 80% diagonal disease, 70% OM disease collaterals from left to right  Repeat catheterization August 21, 2017, patent LAD stent, occluded proximal RCA in-stent restenosis, 50% ramus disease  hospitalized in early July 2021 with nausea, vomiting, and diarrhea. Patient underwent EGD that showed erythematous mucosa in stomach antrum, and duodenum. Biopsy was taken. Prontonix and outpatient f/u were recommended. Pathology showed no malignancy.   Cardiac testing and imaging as  below Coronary angiography 08/10/2020: LM: Normal LAD: Patent ostial LAD stent. 20% ostial diagonal stenosis LCx: 20% OM1 stenosis RCA: Prox in-stent CTO. Left-to-left collaterals fill up to mid RCA Normal LVEDP   Echocardiogram 07/16/2020:  Poor visualization of endocardial borders.  Left ventricle cavity is  normal in size and thickness. Anterior/anteroseptal akinesis. LVEF  probably 40-45%.  Indeterminate diastolic filling pattern.  No significant valvular abnormalities.  normal right atrial pressure.  No significant change compared to previous study in 2018.   Real-time Outpatient Cardiac Telemetry 04/01/2020 - 04/15/2020: Dominant rhythm: Sinus. HR 44-150 bpm. Avg HR 72 bpm. Supraventricular ectopy, PAC/upto 6 beat run. SVE burden <1% Ventricular ectopy, PVC, NSVT upto 5 beats. VE burden <1% No atrial fibrillation/atrial flutter/SVT/high grade AV block, sinus pause >3sec noted. 12 patient activated events do not correlate with arrhthymias.    Stress perfusion MRI 11/07/2017: 1. Moderately dilated left ventricle with normal wall thickness and moderately decreased systolic function (LVEF = 43%). There is hypokinesis of the basal anteroseptal, anterior walls and akinesis of the mid inferoseptal, anteroseptal, anterior, all apical walls including true apex. A large infarct in the basal and mid inferior, inferoseptal, anteroseptal, anterior walls and in all the apical walls with no ischemia. 2. Normal right ventricular size, thickness and systolic function (LVEF = 56%). There are no regional wall motion abnormalities. 3.  Mildly dilated left atrium. 4. Mild mitral and tricuspid regurgitation. Collectively, these findings are consistent with an ischemic cardiomyopathy with a large infarct in the LAD and RCA territory with no peri-infarct ischemia. There is poor chance of recovery if Revascularized.   Cath 08/21/2017: LM: Normal LAD: Patent ostial LAD  stent. 20% ostial diagonal  stenosis Ramus: 50% proximal stenosis RCA: Not engaged today. Known RCA CTO   Elevated LVEDP LVEF 45%   PMH:   has a past medical history of Acute MI anterior wall first episode care [facility]) (07/13/2017), Cardiogenic shock ([place]) (07/13/2017), CHF (congestive heart failure) ([place]), Graves disease, Hypertension, MI (myocardial infarction) ([place]), and Thyroid disease.  PSH:    Past Surgical History:  Procedure Laterality Date   CORONARY ANGIOPLASTY WITH STENT PLACEMENT     CORONARY/GRAFT ACUTE MI REVASCULARIZATION N/A 07/13/2017   Procedure: Coronary/Graft Acute MI Revascularization;  Surgeon: Adrian Prows, MD;  Location: [facility];  Service: Cardiovascular;  Laterality: N/A;   ESOPHAGOGASTRODUODENOSCOPY N/A 06/30/2020   Procedure: ESOPHAGOGASTRODUODENOSCOPY (EGD);  Surgeon: Toledo, Benay Pike, MD;  Location: [facility];  Service: Gastroenterology;  Laterality: N/A;   LEFT HEART CATH AND CORONARY ANGIOGRAPHY N/A 07/13/2017   Procedure: Left Heart Cath and Coronary Angiography;  Surgeon: Adrian Prows, MD;  Location: [facility];  Service: Cardiovascular;  Laterality: N/A;   LEFT HEART CATH AND CORONARY ANGIOGRAPHY N/A 08/21/2017   Procedure: LEFT HEART CATH AND CORONARY ANGIOGRAPHY;  Surgeon: Nigel Mormon, MD;  Location: [facility];  Service: Cardiovascular;  Laterality: N/A;   LEFT HEART CATH AND CORONARY ANGIOGRAPHY N/A 08/10/2020   Procedure: LEFT HEART CATH AND CORONARY ANGIOGRAPHY;  Surgeon: Nigel Mormon, MD;  Location: [facility];  Service: Cardiovascular;  Laterality: N/A;   SKIN GRAFT     VENTRICULAR ASSIST DEVICE INSERTION N/A 07/13/2017   Procedure: Ventricular Assist Device Insertion;  Surgeon: Adrian Prows, MD;  Location: [facility];  Service: Cardiovascular;  Laterality: N/A;    Current Outpatient Medications  Medication Sig Dispense Refill   ALPRAZolam (XANAX) 0.5 MG tablet Take 0.5 mg by mouth at bedtime as needed.     aspirin 81 MG  chewable tablet Chew 1 tablet (81 mg total) by mouth daily. 30 tablet 11   atorvastatin (LIPITOR) 20 MG tablet TAKE 1 TABLET BY MOUTH DAILY. 90 tablet 0   carvedilol (COREG) 3.125 MG tablet Take 1 tablet (3.125 mg total) by mouth 2 (two) times daily. 180 tablet 3   cholecalciferol 25 MCG (1000 UT) tablet Take 1,000 Units by mouth daily.     dapagliflozin propanediol (FARXIGA) 10 MG TABS tablet Take 1 tablet (10 mg total) by mouth daily before breakfast. 90 tablet 2   Evolocumab (REPATHA SURECLICK) [id number] MG/ML SOAJ inject '140mg'$  every 14 days 6 mL 4   ezetimibe (ZETIA) 10 MG tablet Take 1 tablet (10 mg total) by mouth daily. 90 tablet 0   ivabradine (CORLANOR) 5 MG TABS tablet Take 1 tablet (5 mg total) by mouth 2 (two) times daily with a meal. 180 tablet 0   levothyroxine (SYNTHROID) 137 MCG tablet Take 1 tablet (137 mcg total) by mouth daily. 30 tablet 5   nitroGLYCERIN (NITROSTAT) 0.4 MG SL tablet PLACE 1 TABLET UNDER THE TONGUE EVERY 5 MINUTES AS NEEDED FOR CHEST PAIN. 25 tablet 2   pantoprazole (PROTONIX) 40 MG tablet TAKE 1 TABLET (40 MG TOTAL) BY MOUTH DAILY. 90 tablet 3   sacubitril-valsartan (ENTRESTO) 24-26 MG Take 1 tablet by mouth 2 (two) times daily. 180 tablet 3   sildenafil (VIAGRA) 100 MG tablet Take 1 tablet (100 mg total) by mouth as needed for erectile dysfunction. TAKE 1 (ONE) TABLET AS NEEDED. DO NOT USE AT SAME TIME AS NITRO 30 tablet 3   spironolactone (ALDACTONE) 25 MG  tablet Take 1 tablet (25 mg total) by mouth daily. Please disregard duplicate prescription of 90 pills 90 tablet 3   FLUoxetine (PROZAC) 20 MG capsule Take 1 capsule (20 mg total) by mouth daily. 90 capsule 0   No current facility-administered medications for this visit.    Allergies:   Patient has no known allergies.   Social History:  The patient  reports that he quit smoking about 5 years ago. His smoking use included cigarettes. He has a 10.00 pack-year smoking history. He has never used smokeless tobacco.  He reports current alcohol use. He reports current drug use. Drug: Marijuana.   Family History:   family history includes Heart disease in his paternal grandmother; Heart disease (age of onset: 80) in his father; Hypertension in his father; Valvular heart disease in his mother.    Review of Systems: Review of Systems  Constitutional: Negative.   HENT: Negative.    Respiratory: Negative.    Cardiovascular: Negative.   Gastrointestinal: Negative.   Musculoskeletal: Negative.   Neurological: Negative.   Psychiatric/Behavioral: Negative.    All other systems reviewed and are negative.   PHYSICAL EXAM: VS:  BP 100/62 (BP Location: Left Arm, Patient Position: Sitting, Cuff Size: Normal)   Pulse 63   Ht '6\' 1"'$  (1.854 m)   Wt 220 lb (99.8 kg)   SpO2 98%   BMI 29.03 kg/m  , BMI Body mass index is 29.03 kg/m. Constitutional:  oriented to person, place, and time. No distress.  HENT:  Head: Grossly normal Eyes:  no discharge. No scleral icterus.  Neck: No JVD, no carotid bruits  Cardiovascular: Regular rate and rhythm, no murmurs appreciated Pulmonary/Chest: Clear to auscultation bilaterally, no wheezes or rails Abdominal: Soft.  no distension.  no tenderness.  Musculoskeletal: Normal range of motion Neurological:  normal muscle tone. Coordination normal. No atrophy Skin: Skin warm and dry Psychiatric: normal affect, pleasant   Recent Labs: 07/26/2022: ALT 17; BUN 11; Creatinine, Ser 1.15; Hemoglobin 16.5; Platelets 226; Potassium 4.9; Sodium 143; TSH 0.773    Lipid Panel Lab Results  Component Value Date   CHOL 74 07/26/2022   HDL 39 (L) 07/26/2022   LDLCALC 24 07/26/2022   TRIG 54 07/26/2022      Wt Readings from Last 3 Encounters:  02/19/23 220 lb (99.8 kg)  07/26/22 214 lb 4 oz (97.2 kg)  06/08/22 225 lb (102.1 kg)      ASSESSMENT AND PLAN:  Problem List Items Addressed This Visit       Cardiology Problems   HFrEF (heart failure with reduced ejection fraction)  (HCC)   HLD (hyperlipidemia)   CAD (coronary artery disease) - Primary   Relevant Orders   EKG 12-Lead   Ischemic cardiomyopathy   Relevant Orders   EKG 12-Lead   Other Visit Diagnoses     History of acute inferior wall MI       Relevant Orders   EKG 12-Lead   Palpitations         Coronary artery disease with stable angina Remote stenting to the RCA, noted to be occluded on catheterization 2018 with collaterals from left to right STEMI with ostial/proximal LAD occlusion, stent placed 2018 Mildly reduced ejection fraction on echocardiogram Would continue current medications including Coreg, spironolactone, Entresto, Farxiga  Ischemic cardiomyopathy/chronic systolic CHF Mildly depressed ejection fraction on echo following prior MI continue low-dose carvedilol, spironolactone, Entresto, Farxiga Appears euvolemic Consider repeat echocardiogram once he has reached his deductibles  Hyperlipidemia Numbers at goal on  PCSK9 inhibitor.Repatha , atorvastatin, Zetia  Anxiety Refill provided for Prozac until he is is able to establish with new primary care Some anxiety waking him middle of the night Uncertain if Prozac 20 is enough or if additional medication is needed    Total encounter time more than 30 minutes  Greater than 50% was spent in counseling and coordination of care with the patient    Signed, Esmond Plants, M.D., Ph.D. [place], [place]

## 2023-02-19 ENCOUNTER — Encounter: Payer: Self-pay | Admitting: Cardiovascular Disease

## 2023-02-19 ENCOUNTER — Other Ambulatory Visit: Payer: Self-pay

## 2023-02-19 ENCOUNTER — Ambulatory Visit: Payer: 59 | Attending: Cardiovascular Disease | Admitting: Cardiovascular Disease

## 2023-02-19 VITALS — BP 100/62 | HR 63 | Ht 73.0 in | Wt 220.0 lb

## 2023-02-19 DIAGNOSIS — I5020 Unspecified systolic (congestive) heart failure: Secondary | ICD-10-CM | POA: Diagnosis not present

## 2023-02-19 DIAGNOSIS — R002 Palpitations: Secondary | ICD-10-CM

## 2023-02-19 DIAGNOSIS — I25118 Atherosclerotic heart disease of native coronary artery with other forms of angina pectoris: Secondary | ICD-10-CM | POA: Diagnosis not present

## 2023-02-19 DIAGNOSIS — I255 Ischemic cardiomyopathy: Secondary | ICD-10-CM

## 2023-02-19 DIAGNOSIS — E782 Mixed hyperlipidemia: Secondary | ICD-10-CM

## 2023-02-19 DIAGNOSIS — I252 Old myocardial infarction: Secondary | ICD-10-CM

## 2023-02-19 MED ORDER — FLUOXETINE HCL 20 MG PO CAPS
20.0000 mg | ORAL_CAPSULE | Freq: Every day | ORAL | 0 refills | Status: DC
  Filled 2023-02-19: qty 90, 90d supply, fill #0

## 2023-02-19 NOTE — Patient Instructions (Signed)
Medication Instructions:  No changes  If you need a refill on your cardiac medications before your next appointment, please call your pharmacy.    Lab work: No new labs needed   Testing/Procedures: No new testing needed   Follow-Up: At [facility], you and your health needs are our priority.  As part of our continuing mission to provide you with exceptional heart care, we have created designated Provider Care Teams.  These Care Teams include your primary Cardiologist (physician) and Advanced Practice Providers (APPs -  Physician Assistants and Nurse Practitioners) who all work together to provide you with the care you need, when you need it.  You will need a follow up appointment in 6 months  Providers on your designated Care Team:   Christopher Berge, NP Ryan Dunn, PA-C Cadence Furth, PA-C  COVID-19 Vaccine Information can be found at: [email or web address] For questions related to vaccine distribution or appointments, please email [email or web address] or call [phone].   

## 2023-02-28 ENCOUNTER — Other Ambulatory Visit: Payer: Self-pay | Admitting: Cardiology

## 2023-02-28 DIAGNOSIS — I5020 Unspecified systolic (congestive) heart failure: Secondary | ICD-10-CM

## 2023-03-01 ENCOUNTER — Other Ambulatory Visit: Payer: Self-pay | Admitting: Cardiovascular Disease

## 2023-03-01 ENCOUNTER — Other Ambulatory Visit: Payer: Self-pay | Admitting: Cardiology

## 2023-03-01 ENCOUNTER — Other Ambulatory Visit: Payer: Self-pay

## 2023-03-01 DIAGNOSIS — I5020 Unspecified systolic (congestive) heart failure: Secondary | ICD-10-CM

## 2023-03-01 MED FILL — Nitroglycerin SL Tab 0.4 MG: SUBLINGUAL | 30 days supply | Qty: 25 | Fill #0 | Status: AC

## 2023-03-01 MED FILL — Dapagliflozin Propanediol Tab 10 MG (Base Equivalent): ORAL | 30 days supply | Qty: 30 | Fill #0 | Status: AC

## 2023-03-02 ENCOUNTER — Other Ambulatory Visit: Payer: Self-pay

## 2023-03-28 ENCOUNTER — Other Ambulatory Visit: Payer: Self-pay

## 2023-03-28 ENCOUNTER — Other Ambulatory Visit: Payer: Self-pay | Admitting: Cardiology

## 2023-03-28 ENCOUNTER — Telehealth: Payer: 59 | Admitting: Physician Assistant

## 2023-03-28 DIAGNOSIS — B9689 Other specified bacterial agents as the cause of diseases classified elsewhere: Secondary | ICD-10-CM

## 2023-03-28 DIAGNOSIS — J019 Acute sinusitis, unspecified: Secondary | ICD-10-CM

## 2023-03-28 MED ORDER — AMOXICILLIN-POT CLAVULANATE 875-125 MG PO TABS
1.0000 | ORAL_TABLET | Freq: Two times a day (BID) | ORAL | 0 refills | Status: DC
  Filled 2023-03-28: qty 14, 7d supply, fill #0

## 2023-03-28 MED FILL — Dapagliflozin Propanediol Tab 10 MG (Base Equivalent): ORAL | 30 days supply | Qty: 30 | Fill #1 | Status: AC

## 2023-03-28 NOTE — Progress Notes (Signed)

## 2023-03-29 ENCOUNTER — Other Ambulatory Visit: Payer: Self-pay

## 2023-04-01 ENCOUNTER — Other Ambulatory Visit: Payer: Self-pay | Admitting: Cardiovascular Disease

## 2023-04-01 DIAGNOSIS — I5020 Unspecified systolic (congestive) heart failure: Secondary | ICD-10-CM

## 2023-04-02 ENCOUNTER — Other Ambulatory Visit: Payer: Self-pay | Admitting: Cardiovascular Disease

## 2023-04-02 ENCOUNTER — Other Ambulatory Visit: Payer: Self-pay

## 2023-04-02 DIAGNOSIS — I5020 Unspecified systolic (congestive) heart failure: Secondary | ICD-10-CM

## 2023-04-02 MED FILL — Ivabradine HCl Tab 5 MG (Base Equiv): ORAL | 90 days supply | Qty: 180 | Fill #0 | Status: AC

## 2023-04-02 MED FILL — Atorvastatin Calcium Tab 20 MG (Base Equivalent): ORAL | 90 days supply | Qty: 90 | Fill #0 | Status: AC

## 2023-04-16 ENCOUNTER — Other Ambulatory Visit: Payer: Self-pay

## 2023-04-16 ENCOUNTER — Other Ambulatory Visit: Payer: Self-pay | Admitting: Cardiology

## 2023-04-16 ENCOUNTER — Other Ambulatory Visit: Payer: Self-pay | Admitting: Cardiovascular Disease

## 2023-04-16 DIAGNOSIS — I5020 Unspecified systolic (congestive) heart failure: Secondary | ICD-10-CM

## 2023-04-16 DIAGNOSIS — I255 Ischemic cardiomyopathy: Secondary | ICD-10-CM

## 2023-04-16 MED FILL — Nitroglycerin SL Tab 0.4 MG: SUBLINGUAL | 30 days supply | Qty: 25 | Fill #1 | Status: AC

## 2023-04-17 ENCOUNTER — Other Ambulatory Visit: Payer: Self-pay | Admitting: Cardiovascular Disease

## 2023-04-17 ENCOUNTER — Other Ambulatory Visit: Payer: Self-pay | Admitting: Cardiology

## 2023-04-17 ENCOUNTER — Other Ambulatory Visit: Payer: Self-pay

## 2023-04-17 DIAGNOSIS — I5020 Unspecified systolic (congestive) heart failure: Secondary | ICD-10-CM

## 2023-04-17 DIAGNOSIS — I255 Ischemic cardiomyopathy: Secondary | ICD-10-CM

## 2023-04-17 MED ORDER — EZETIMIBE 10 MG PO TABS
10.0000 mg | ORAL_TABLET | Freq: Every day | ORAL | 0 refills | Status: DC
  Filled 2023-04-17: qty 90, 90d supply, fill #0

## 2023-04-17 MED ORDER — SPIRONOLACTONE 25 MG PO TABS
25.00 mg | ORAL_TABLET | Freq: Every day | ORAL | 0 refills | Status: DC
Start: 2023-04-17 — End: 2023-06-29
  Filled 2023-04-17: qty 90, 90d supply, fill #0

## 2023-04-17 MED FILL — Sacubitril-Valsartan Tab 24-26 MG: ORAL | 90 days supply | Qty: 180 | Fill #0 | Status: AC

## 2023-04-26 ENCOUNTER — Ambulatory Visit (INDEPENDENT_AMBULATORY_CARE_PROVIDER_SITE_OTHER): Payer: 59 | Admitting: Gastroenterology

## 2023-04-26 ENCOUNTER — Encounter: Payer: Self-pay | Admitting: Gastroenterology

## 2023-04-26 ENCOUNTER — Other Ambulatory Visit: Payer: Self-pay

## 2023-04-26 VITALS — BP 136/84 | HR 73 | Temp 98.0°F | Ht 73.0 in | Wt 219.0 lb

## 2023-04-26 DIAGNOSIS — R1013 Epigastric pain: Secondary | ICD-10-CM | POA: Diagnosis not present

## 2023-04-26 DIAGNOSIS — R140 Abdominal distension (gaseous): Secondary | ICD-10-CM

## 2023-04-26 DIAGNOSIS — K529 Noninfective gastroenteritis and colitis, unspecified: Secondary | ICD-10-CM

## 2023-04-26 DIAGNOSIS — R112 Nausea with vomiting, unspecified: Secondary | ICD-10-CM | POA: Diagnosis not present

## 2023-04-26 NOTE — Patient Instructions (Addendum)
CT angio is schedule on 05/04/2023 arrive at 2:00pm for a 2:30pm scan at the [facility]. Nothing to eat or drink 4 hours before the scan,  If you need to reschedule please call [phone] option 3 and then option 2.  You Gastric emptying study is on 05/24/024 arrive 9:00am for 9:30 am scan at the medical mall. Nothing to eat or drink after midnight. If you need to reschedule you can call [phone] option 3 and then option 2.

## 2023-04-26 NOTE — Progress Notes (Signed)
Arlyss Repress, MD [address]  [address]  [place], [place] [address]  Main: [phone]  Fax: [phone]    Gastroenterology Consultation  Referring Provider:     Dana Allan, MD Primary Care Physician:  Dana Allan, MD Primary Gastroenterologist:  Dr. Arlyss Repress Reason for Consultation: Abdominal bloating, diarrhea, epigastric pain, nausea and vomiting        HPI:   Bobby [name] is a 52 y.o. male referred by Dr. Dana Allan, MD  for consultation & management of chronic intermittent symptoms of significant abdominal bloating associated with epigastric pain and episodes of nausea and vomiting with frequent, nonbloody bowel movements.  Patient originally was admitted to [facility] in 2021 due to the symptoms.  Underwent EGD by Dr. Norma Fredrickson which was unremarkable.  For last 2 weeks he has been experiencing recurrent episodes of nausea and vomiting, with upper abdominal discomfort, significant abdominal bloating as well as several episodes of nonbloody diarrhea.  He reports vomiting undigested food from 2 days ago. Significant epigastric discomfort which is worse postprandial, he denies bloating when he wakes up, worse throughout the day.  He was recently treated for sinusitis.  He lost about 4 to 6 pounds within last 2 weeks.  Accompanied by his wife today.  He states no particular relation to food, denies any food allergies.  He likes to eat cheese on a daily basis  He has history of premature coronary artery disease, history of MI, status post successful revascularization, stent in 2018, ischemic cardiomyopathy, mildly depressed EF  NSAIDs: None  Antiplts/Anticoagulants/Anti thrombotics: None  GI Procedures:  DIAGNOSIS:  A. STOMACH, ANTRUM; COLD BIOPSY:  - ANTRAL MUCOSA WITH MILD REACTIVE GASTRITIS.  - OXYNTIC MUCOSA WITH CHANGES CONSISTENT WITH PROTON PUMP INHIBITOR USE.  - NEGATIVE FOR H. PYLORI, DYSPLASIA, AND MALIGNANCY.   B.  GEJ; COLD BIOPSY:  - SQUAMOCOLUMNAR  JUNCTIONAL MUCOSA WITH REFLUX GASTROESOPHAGITIS.  - SEPARATE FRAGMENTS OF UNREMARKABLE SQUAMOUS MUCOSA.  - NEGATIVE FOR INTESTINAL METAPLASIA, DYSPLASIA, AND MALIGNANCY.   Past Medical History:  Diagnosis Date   Acute MI anterior wall first episode care (HCC) 07/13/2017   Occluded ostial LAD S/P 3.5x26 Onyx DES. RI 80%. RCA Occluded at prior stent site with collaterals    Cardiogenic shock (HCC) 07/13/2017   CHF (congestive heart failure) (HCC)    Graves disease    Hypertension    MI (myocardial infarction) (HCC)    Thyroid disease    thyroid irradiated per pt    Past Surgical History:  Procedure Laterality Date   CORONARY ANGIOPLASTY WITH STENT PLACEMENT     CORONARY/GRAFT ACUTE MI REVASCULARIZATION N/A 07/13/2017   Procedure: Coronary/Graft Acute MI Revascularization;  Surgeon: Yates Decamp, MD;  [facility];  Service: Cardiovascular;  Laterality: N/A;   ESOPHAGOGASTRODUODENOSCOPY N/A 06/30/2020   Procedure: ESOPHAGOGASTRODUODENOSCOPY (EGD);  Surgeon: Toledo, Boykin Nearing, MD;  Location: [facility];  Service: Gastroenterology;  Laterality: N/A;   LEFT HEART CATH AND CORONARY ANGIOGRAPHY N/A 07/13/2017   Procedure: Left Heart Cath and Coronary Angiography;  Surgeon: Yates Decamp, MD;  Location: [facility];  Service: Cardiovascular;  Laterality: N/A;   LEFT HEART CATH AND CORONARY ANGIOGRAPHY N/A 08/21/2017   Procedure: LEFT HEART CATH AND CORONARY ANGIOGRAPHY;  Surgeon: Elder Negus, MD;  Location: [facility];  Service: Cardiovascular;  Laterality: N/A;   LEFT HEART CATH AND CORONARY ANGIOGRAPHY N/A 08/10/2020   Procedure: LEFT HEART CATH AND CORONARY ANGIOGRAPHY;  Surgeon: Elder Negus, MD;  Location: [facility];  Service: Cardiovascular;  Laterality: N/A;   SKIN GRAFT     VENTRICULAR ASSIST DEVICE INSERTION N/A 07/13/2017   Procedure: Ventricular Assist Device Insertion;  Surgeon: Yates Decamp, MD;  [facility]: [facility];  Service:  Cardiovascular;  Laterality: N/A;     Current Outpatient Medications:    ALPRAZolam (XANAX) 0.5 MG tablet, Take 0.5 mg by mouth at bedtime as needed., Disp: , Rfl:    aspirin 81 MG chewable tablet, Chew 1 tablet (81 mg total) by mouth daily., Disp: 30 tablet, Rfl: 11   atorvastatin (LIPITOR) 20 MG tablet, Take 1 tablet (20 mg total) by mouth daily., Disp: 90 tablet, Rfl: 0   carvedilol (COREG) 3.125 MG tablet, Take 1 tablet (3.125 mg total) by mouth 2 (two) times daily., Disp: 180 tablet, Rfl: 3   cholecalciferol 25 MCG (1000 UT) tablet, Take 1,000 Units by mouth daily., Disp: , Rfl:    dapagliflozin propanediol (FARXIGA) 10 MG TABS tablet, Take 1 tablet (10 mg total) by mouth daily before breakfast., Disp: 90 tablet, Rfl: 2   Evolocumab (REPATHA SURECLICK) 140 MG/ML SOAJ, inject 140mg  every 14 days, Disp: 6 mL, Rfl: 4   ezetimibe (ZETIA) 10 MG tablet, Take 1 tablet (10 mg total) by mouth daily., Disp: 90 tablet, Rfl: 0   ivabradine (CORLANOR) 5 MG TABS tablet, Take 1 tablet (5 mg total) by mouth 2 (two) times daily with a meal., Disp: 180 tablet, Rfl: 0   levothyroxine (SYNTHROID) 137 MCG tablet, Take 1 tablet (137 mcg total) by mouth daily., Disp: 30 tablet, Rfl: 5   nitroGLYCERIN (NITROSTAT) 0.4 MG SL tablet, PLACE 1 TABLET UNDER THE TONGUE EVERY 5 MINUTES AS NEEDED FOR CHEST PAIN., Disp: 25 tablet, Rfl: 2   pantoprazole (PROTONIX) 40 MG tablet, TAKE 1 TABLET (40 MG TOTAL) BY MOUTH DAILY., Disp: 90 tablet, Rfl: 3   sacubitril-valsartan (ENTRESTO) 24-26 MG, Take 1 tablet by mouth 2 (two) times daily., Disp: 180 tablet, Rfl: 0   sildenafil (VIAGRA) 100 MG tablet, Take 1 tablet (100 mg total) by mouth as needed for erectile dysfunction. TAKE 1 (ONE) TABLET AS NEEDED. DO NOT USE AT SAME TIME AS NITRO, Disp: 30 tablet, Rfl: 3   spironolactone (ALDACTONE) 25 MG tablet, Take 1 tablet (25 mg total) by mouth daily., Disp: 90 tablet, Rfl: 0   Family History  Problem Relation Age of Onset   Valvular  heart disease Mother    Heart disease Father 84       CABG   Hypertension Father    Heart disease Paternal Grandmother      Social History   Tobacco Use   Smoking status: Former    Packs/day: 0.50    Years: 20.00    Additional pack years: 0.00    Total pack years: 10.00    Types: Cigarettes    Quit date: 07/13/2017    Years since quitting: 5.7   Smokeless tobacco: Never   Tobacco comments:    quit 2012  Vaping Use   Vaping Use: Never used  Substance Use Topics   Alcohol use: Yes    Comment: occ   Drug use: Yes    Types: Marijuana    Comment: 1-2 times weekly    Allergies as of 04/26/2023   (No Known Allergies)    Review of Systems:    All systems reviewed and negative except where noted in HPI.   Physical Exam:  BP 136/84 (BP Location: Left Arm, Patient  Position: Sitting, Cuff Size: Normal)   Pulse 73   Temp 98 F (36.7 C) (Oral)   Ht 6\' 1"  (1.854 m)   Wt 219 lb (99.3 kg)   BMI 28.89 kg/m  No LMP for male patient.  General:   Alert,  Well-developed, well-nourished, pleasant and cooperative in NAD Head:  Normocephalic and atraumatic. Eyes:  Sclera clear, no icterus.   Conjunctiva pink. Ears:  Normal auditory acuity. Nose:  No deformity, discharge, or lesions. Mouth:  No deformity or lesions,oropharynx pink & moist. Neck:  Supple; no masses or thyromegaly. Lungs:  Respirations even and unlabored.  Clear throughout to auscultation.   No wheezes, crackles, or rhonchi. No acute distress. Heart:  Regular rate and rhythm; no murmurs, clicks, rubs, or gallops. Abdomen:  Normal bowel sounds. Soft, non-tender and moderately distended, tympanic without masses, hepatosplenomegaly or hernias noted.  No guarding or rebound tenderness.   Rectal: Not performed Msk:  Symmetrical without gross deformities. Good, equal movement & strength bilaterally. Pulses:  Normal pulses noted. Extremities:  No clubbing or edema.  No cyanosis. Neurologic:  Alert and oriented x3;  grossly  normal neurologically. Skin:  Intact without significant lesions or rashes. No jaundice. Psych:  Alert and cooperative. Normal mood and affect.  Imaging Studies: Reviewed  Assessment and Plan:   Bobby [name] is a 52 y.o. male with history of diabetes, coronary artery disease, ischemic cardiomyopathy, MI s/p PCI with revascularization in the past presents with chronic intermittent symptoms of severe abdominal bloating associated with nausea and vomiting of undigested food, epigastric discomfort and diarrhea.  Most recent episode started about 2 weeks ago which has resulted in some weight loss EGD in 2021 was unremarkable Colonoscopy in 2023 at outside facility was reportedly normal  Differentials include it pathic gastroparesis or chronic mesenteric ischemia or exocrine pancreatic insufficiency or infectious etiology or celiac disease or monitor bacterial overgrowth or alpha gal deficiency or food intolerances  Recommend CT angio abdomen and pelvis Celiac disease panel GI profile PCR, pancreatic fecal elastase levels Fecal calprotectin levels Alpha gal panel Gastric emptying study One month trial of strict lactose-free diet, information provided  Follow up in 3 to 4 months, sooner as needed based on the above workup   Arlyss Repress, MD

## 2023-04-27 LAB — CELIAC DISEASE PANEL: IgA/Immunoglobulin A, Serum: 112 mg/dL (ref 90–386)

## 2023-04-27 LAB — ALPHA-GAL PANEL

## 2023-04-29 LAB — ALPHA-GAL PANEL

## 2023-04-30 DIAGNOSIS — R1013 Epigastric pain: Secondary | ICD-10-CM | POA: Diagnosis not present

## 2023-04-30 DIAGNOSIS — R140 Abdominal distension (gaseous): Secondary | ICD-10-CM | POA: Diagnosis not present

## 2023-04-30 DIAGNOSIS — R112 Nausea with vomiting, unspecified: Secondary | ICD-10-CM | POA: Diagnosis not present

## 2023-04-30 DIAGNOSIS — K529 Noninfective gastroenteritis and colitis, unspecified: Secondary | ICD-10-CM | POA: Diagnosis not present

## 2023-05-01 LAB — ALPHA-GAL PANEL
Allergen Lamb IgE: <0.1 kU/L
IgE (Immunoglobulin E), Serum: 1974 IU/mL — ABNORMAL HIGH (ref 6–495)
O215-IgE Alpha-Gal: <0.1 kU/L
Pork IgE: <0.1 kU/L

## 2023-05-01 LAB — CELIAC DISEASE PANEL
Endomysial IgA: NEGATIVE
Transglutaminase IgA: <2 U/mL (ref 0–3)

## 2023-05-02 LAB — GI PROFILE, STOOL, PCR
C difficile toxin A/B: NOT DETECTED
Enteropathogenic E coli: NOT DETECTED
Enterotoxigenic E coli: NOT DETECTED
Salmonella: NOT DETECTED
Shiga-toxin-producing E coli: NOT DETECTED

## 2023-05-02 LAB — CALPROTECTIN, FECAL

## 2023-05-03 ENCOUNTER — Other Ambulatory Visit: Payer: Self-pay

## 2023-05-03 MED FILL — Dapagliflozin Propanediol Tab 10 MG (Base Equivalent): ORAL | 30 days supply | Qty: 30 | Fill #2 | Status: AC

## 2023-05-04 ENCOUNTER — Ambulatory Visit
Admission: RE | Admit: 2023-05-04 | Discharge: 2023-05-04 | Disposition: A | Discharge status: Home / Self Care | Payer: BC Managed Care – PPO | Source: Ambulatory Visit | Attending: Gastroenterology | Admitting: Gastroenterology

## 2023-05-04 DIAGNOSIS — R140 Abdominal distension (gaseous): Secondary | ICD-10-CM

## 2023-05-04 DIAGNOSIS — K529 Noninfective gastroenteritis and colitis, unspecified: Secondary | ICD-10-CM | POA: Diagnosis not present

## 2023-05-04 DIAGNOSIS — R1013 Epigastric pain: Secondary | ICD-10-CM

## 2023-05-04 DIAGNOSIS — R112 Nausea with vomiting, unspecified: Secondary | ICD-10-CM | POA: Diagnosis not present

## 2023-05-04 MED ORDER — IOHEXOL 300 MG/ML  SOLN: 100.0000 mL | Freq: Once | INTRAMUSCULAR | Status: DC | PRN

## 2023-05-04 MED ORDER — IOHEXOL 350 MG/ML SOLN
100.0000 mL | Freq: Once | INTRAVENOUS | Status: AC | PRN
  Administered 2023-05-04: 100 mL via INTRAVENOUS

## 2023-05-05 LAB — GI PROFILE, STOOL, PCR
Astrovirus: NOT DETECTED
Cryptosporidium: NOT DETECTED
Enteroaggregative E coli: NOT DETECTED
Norovirus GI/GII: NOT DETECTED
Plesiomonas shigelloides: NOT DETECTED
Rotavirus A: NOT DETECTED
Sapovirus: NOT DETECTED
Shigella/Enteroinvasive E coli: NOT DETECTED

## 2023-05-05 LAB — PANCREATIC ELASTASE, FECAL

## 2023-05-07 ENCOUNTER — Encounter: Payer: Self-pay | Admitting: Gastroenterology

## 2023-05-10 LAB — GI PROFILE, STOOL, PCR
Adenovirus F 40/41: NOT DETECTED
Campylobacter: NOT DETECTED
Cyclospora cayetanensis: NOT DETECTED
Entamoeba histolytica: NOT DETECTED
Giardia lamblia: NOT DETECTED
Vibrio cholerae: NOT DETECTED
Vibrio: NOT DETECTED
Yersinia enterocolitica: NOT DETECTED

## 2023-05-18 ENCOUNTER — Other Ambulatory Visit: Payer: 59

## 2023-05-23 ENCOUNTER — Ambulatory Visit (INDEPENDENT_AMBULATORY_CARE_PROVIDER_SITE_OTHER): Payer: BC Managed Care – PPO | Admitting: Family Medicine

## 2023-05-23 ENCOUNTER — Other Ambulatory Visit: Payer: Self-pay

## 2023-05-23 VITALS — BP 118/72 | HR 64 | Ht 73.0 in | Wt 216.0 lb

## 2023-05-23 DIAGNOSIS — Z79899 Other long term (current) drug therapy: Secondary | ICD-10-CM

## 2023-05-23 DIAGNOSIS — R7303 Prediabetes: Secondary | ICD-10-CM

## 2023-05-23 DIAGNOSIS — F39 Unspecified mood [affective] disorder: Secondary | ICD-10-CM | POA: Diagnosis not present

## 2023-05-23 DIAGNOSIS — Z125 Encounter for screening for malignant neoplasm of prostate: Secondary | ICD-10-CM

## 2023-05-23 DIAGNOSIS — E039 Hypothyroidism, unspecified: Secondary | ICD-10-CM | POA: Diagnosis not present

## 2023-05-23 DIAGNOSIS — I5020 Unspecified systolic (congestive) heart failure: Secondary | ICD-10-CM

## 2023-05-23 DIAGNOSIS — E559 Vitamin D deficiency, unspecified: Secondary | ICD-10-CM

## 2023-05-23 DIAGNOSIS — Z7689 Persons encountering health services in other specified circumstances: Secondary | ICD-10-CM

## 2023-05-23 DIAGNOSIS — Z114 Encounter for screening for human immunodeficiency virus [HIV]: Secondary | ICD-10-CM

## 2023-05-23 DIAGNOSIS — Z1159 Encounter for screening for other viral diseases: Secondary | ICD-10-CM

## 2023-05-23 DIAGNOSIS — E785 Hyperlipidemia, unspecified: Secondary | ICD-10-CM

## 2023-05-23 DIAGNOSIS — I700 Atherosclerosis of aorta: Secondary | ICD-10-CM

## 2023-05-23 DIAGNOSIS — I10 Essential (primary) hypertension: Secondary | ICD-10-CM | POA: Diagnosis not present

## 2023-05-23 DIAGNOSIS — E538 Deficiency of other specified B group vitamins: Secondary | ICD-10-CM

## 2023-05-23 DIAGNOSIS — K219 Gastro-esophageal reflux disease without esophagitis: Secondary | ICD-10-CM

## 2023-05-23 MED ORDER — ALPRAZOLAM 0.5 MG PO TABS
0.5000 mg | ORAL_TABLET | Freq: Every evening | ORAL | 0 refills | Status: DC | PRN
Start: 2023-05-23 — End: 2023-07-20
  Filled 2023-05-23: qty 15, 15d supply, fill #0

## 2023-05-23 MED ORDER — PANTOPRAZOLE SODIUM 40 MG PO TBEC
40.0000 mg | DELAYED_RELEASE_TABLET | Freq: Two times a day (BID) | ORAL | 3 refills | Status: DC
Start: 2023-05-23 — End: 2023-10-04
  Filled 2023-05-23 – 2023-05-29 (×4): qty 60, 30d supply, fill #0
  Filled 2023-07-10: qty 60, 30d supply, fill #1
  Filled 2023-08-06: qty 60, 30d supply, fill #2
  Filled 2023-09-04: qty 60, 30d supply, fill #3

## 2023-05-23 NOTE — Assessment & Plan Note (Signed)
Chronic. Seen on CT angio abdomen 04/2023 On zetia and statin Check fasting lipids Follows with Cardiology

## 2023-05-23 NOTE — Patient Instructions (Signed)
It was a pleasure meeting you today. Thank you for allowing me to take part in your health care.  Our goals for today as we discussed include:  Refill sent for requested medication  Schedule annual physical Schedule lab appointment 1 week prior to annual visit.  Fast for 10 hours  If you have any questions or concerns, please do not hesitate to call the office at [phone].  I look forward to our next visit and until then take care and stay safe.  Regards,   Dana Allan, MD   [facility]

## 2023-05-23 NOTE — Progress Notes (Signed)
SUBJECTIVE:   Chief Complaint  Patient presents with   Establish Care   HPI Patient presents to clinic to establish care.  No acute concerns today.  Hypothyroid Asymptomatic.  Currently takes levothyroxine 137 mcg daily and tolerating well.  Hyperlipidemia On Lipitor 20 mg daily, Repatha 140 mg injection every 14 days and Zetia 10 mg daily.  Unable to tolerate high-dose statin secondary to myalgia.  Follows with cardiology  Hypertension Asymptomatic.  Well-controlled on current medication.  Takes carvedilol 3.125 mg twice daily, Aldactone 25 mg daily  HFrEF Asymptomatic.  Euvolemic on exam currently on Coreg 3.125 mg twice daily, Farxiga 10 mg daily, Aldactone 25 mg daily, Entresto 24-26 mg twice daily, Corlanor 5 mg twice daily and low-dose ASA 81 mg daily.  Follows with Dr. Mariah Milling, cardiology  Mood disorder Chronic.  Intermittent panic attacks.  Takes Xanax 0.5 mg as needed.  Last refill 2023.  Previously tried SSRI but did not like side effects.  Requesting refills for Xanax.  GERD. Recently had increased stomach upset, abdominal bloating, nausea and vomiting.  Was seen by GI.  Protonix was increased from 40 mg daily to twice daily.  PERTINENT PMH / PSH: CAD HFrEF Hypothyroid Graves' disease GERD Hypertension Prediabetes History MI x 2, 2003, 2019 Previous history of tobacco use EtOH use 2-3 beers weekly Denies THC/cocaine/heroin use  Works in [employer] at [employer] Married. 2 kids  OBJECTIVE:  BP 118/72   Pulse 64   Ht 6\' 1"  (1.854 m)   Wt 216 lb (98 kg)   SpO2 94%   BMI 28.50 kg/m    Physical Exam Vitals reviewed.  Constitutional:      Appearance: Normal appearance.  HENT:     Head: Normocephalic.     Right Ear: Tympanic membrane, ear canal and external ear normal.     Left Ear: Tympanic membrane, ear canal and external ear normal.     Nose: Nose normal.     Mouth/Throat:     Mouth: Mucous membranes are moist.  Eyes:      Conjunctiva/sclera: Conjunctivae normal.     Pupils: Pupils are equal, round, and reactive to light.  Neck:     Thyroid: No thyromegaly or thyroid tenderness.     Vascular: No carotid bruit.  Cardiovascular:     Rate and Rhythm: Normal rate and regular rhythm.     Pulses: Normal pulses.     Heart sounds: Normal heart sounds.  Pulmonary:     Effort: Pulmonary effort is normal.     Breath sounds: Normal breath sounds.  Abdominal:     General: Abdomen is flat. Bowel sounds are normal.     Palpations: Abdomen is soft.  Musculoskeletal:        General: Normal range of motion.     Cervical back: Normal range of motion and neck supple.     Right lower leg: No edema.     Left lower leg: No edema.  Lymphadenopathy:     Cervical: No cervical adenopathy.  Neurological:     Mental Status: He is alert.  Psychiatric:        Mood and Affect: Mood normal.        Behavior: Behavior normal.        Thought Content: Thought content normal.        Judgment: Judgment normal.     ASSESSMENT/PLAN:  Mood disorder (HCC) Assessment & Plan: Chronic.  Takes Xanax 0.5 mg as needed.  Has intermittent episodes of panic  attacks. PDMP reviewed and appropriate None opioid contract signed UDS today Xanax 0.5 mg nightly x 15 tablets Offered SSRI, patient reports previously tried and did not like side effects. Encourage CBT Follow-up in 3 months  Orders: -     ALPRAZolam; Take 1 tablet (0.5 mg total) by mouth at bedtime as needed.  Dispense: 15 tablet; Refill: 0  Primary hypertension Assessment & Plan: Chronic.  Well-controlled on current medications. Continue Coreg 3.125 mg twice daily Continue Aldactone 25 mg daily Continue Entresto 24-26 mg twice daily Follows with cardiology  Orders: -     Comprehensive metabolic panel; Future -     CBC with Differential/Platelet; Future  Hyperlipidemia, unspecified hyperlipidemia type Assessment & Plan: Chronic.   Continue Repatha 140 mg injection every  14 days Continue Zetia 10 mg daily Continue Lipitor 20 mg daily Check fasting lipids Follow-up cardiology as scheduled   Orders: -     Lipid panel; Future  Need for hepatitis C screening test -     Hepatitis C antibody; Future  Encounter for screening for HIV  Hypothyroidism, unspecified type Assessment & Plan: Chronic.  Asymptomatic. Continue Synthroid 137 mcg daily Check TSH   Orders: -     TSH; Future  Prediabetes -     Hemoglobin A1c; Future  Prostate cancer screening -     PSA; Future  HFrEF (heart failure with reduced ejection fraction) (HCC) Assessment & Plan: Chronic.  Euvolemic on exam. Continue Coreg 3.125 mg twice daily Continue Farxiga 10 mg daily Continue Corlanor 5 mg twice daily Continue Entresto 24-26 mg twice daily Continue Aldactone 25 mg daily Follow-up with cardiology as scheduled   Aortic atherosclerosis (HCC) Assessment & Plan: Chronic. Seen on CT angio abdomen 04/2023 On zetia and statin Check fasting lipids Follows with Cardiology   Chronic use of benzodiazepine for therapeutic purpose -     ToxASSURE Select 13 (MW), Urine  Vitamin B 12 deficiency -     Vitamin B12; Future  Vitamin D deficiency -     VITAMIN D 25 Hydroxy (Vit-D Deficiency, Fractures); Future  Gastroesophageal reflux disease without esophagitis Assessment & Plan: Chronic.  Increasing abdominal bloating and GI upset Protonix recently increased to BID Refill Protonix 40 mg twice daily Follow-up with Dr. Allegra Lai as scheduled  Orders: -     Pantoprazole Sodium; Take 1 tablet (40 mg total) by mouth 2 (two) times daily.  Dispense: 60 tablet; Refill: 3  HCM Hepatitis C/HIV screening labs today PSA screening with labs Colonoscopy up-to-date.  Due 02/2032 Shingles vaccine up-to-date Tdap up-to-date. Recommend pneumonia 20 vaccine   PDMP reviewed  Return in about 1 month (around 06/23/2023) for PCP, annual visit with fasting labs 1 week prior.  Dana Allan,  MD

## 2023-05-24 ENCOUNTER — Other Ambulatory Visit: Payer: Self-pay

## 2023-05-25 LAB — TOXASSURE SELECT 13 (MW), URINE

## 2023-05-27 ENCOUNTER — Encounter: Payer: Self-pay | Admitting: Family Medicine

## 2023-05-27 DIAGNOSIS — Z114 Encounter for screening for human immunodeficiency virus [HIV]: Secondary | ICD-10-CM

## 2023-05-27 DIAGNOSIS — E538 Deficiency of other specified B group vitamins: Secondary | ICD-10-CM | POA: Insufficient documentation

## 2023-05-27 DIAGNOSIS — F39 Unspecified mood [affective] disorder: Secondary | ICD-10-CM | POA: Insufficient documentation

## 2023-05-27 DIAGNOSIS — E559 Vitamin D deficiency, unspecified: Secondary | ICD-10-CM | POA: Insufficient documentation

## 2023-05-27 DIAGNOSIS — Z79899 Other long term (current) drug therapy: Secondary | ICD-10-CM | POA: Insufficient documentation

## 2023-05-27 DIAGNOSIS — Z1159 Encounter for screening for other viral diseases: Secondary | ICD-10-CM | POA: Insufficient documentation

## 2023-05-27 DIAGNOSIS — Z125 Encounter for screening for malignant neoplasm of prostate: Secondary | ICD-10-CM | POA: Insufficient documentation

## 2023-05-27 DIAGNOSIS — R7303 Prediabetes: Secondary | ICD-10-CM | POA: Insufficient documentation

## 2023-05-27 DIAGNOSIS — F339 Major depressive disorder, recurrent, unspecified: Secondary | ICD-10-CM | POA: Insufficient documentation

## 2023-05-27 HISTORY — DX: Encounter for screening for human immunodeficiency virus (HIV): Z11.4

## 2023-05-27 HISTORY — DX: Prediabetes: R73.03

## 2023-05-27 HISTORY — DX: Vitamin D deficiency, unspecified: E55.9

## 2023-05-27 NOTE — Assessment & Plan Note (Signed)
Chronic.   Continue Repatha 140 mg injection every 14 days Continue Zetia 10 mg daily Continue Lipitor 20 mg daily Check fasting lipids Follow-up cardiology as scheduled

## 2023-05-27 NOTE — Assessment & Plan Note (Signed)
Chronic.  Increasing abdominal bloating and GI upset Protonix recently increased to BID Refill Protonix 40 mg twice daily Follow-up with Dr. Allegra Lai as scheduled

## 2023-05-27 NOTE — Assessment & Plan Note (Signed)
>>  ASSESSMENT AND PLAN FOR DEPRESSION, MAJOR, RECURRENT (HCC) WRITTEN ON 05/27/2023  6:46 PM BY WALSH, TANYA, MD  Chronic.  Takes Xanax  0.5 mg as needed.  Has intermittent episodes of panic attacks. PDMP reviewed and appropriate None opioid contract signed UDS today Xanax  0.5 mg nightly x 15 tablets Offered SSRI, patient reports previously tried and did not like side effects. Encourage CBT Follow-up in 3 months

## 2023-05-27 NOTE — Assessment & Plan Note (Addendum)
Chronic.  Euvolemic on exam. Continue Coreg 3.125 mg twice daily Continue Farxiga 10 mg daily Continue Corlanor 5 mg twice daily Continue Entresto 24-26 mg twice daily Continue Aldactone 25 mg daily Follow-up with cardiology as scheduled

## 2023-05-27 NOTE — Assessment & Plan Note (Addendum)
Chronic.  Well-controlled on current medications. Continue Coreg 3.125 mg twice daily Continue Aldactone 25 mg daily Continue Entresto 24-26 mg twice daily Follows with cardiology

## 2023-05-27 NOTE — Assessment & Plan Note (Signed)
Chronic.  Takes Xanax 0.5 mg as needed.  Has intermittent episodes of panic attacks. PDMP reviewed and appropriate None opioid contract signed UDS today Xanax 0.5 mg nightly x 15 tablets Offered SSRI, patient reports previously tried and did not like side effects. Encourage CBT Follow-up in 3 months

## 2023-05-27 NOTE — Assessment & Plan Note (Addendum)
Chronic.  Asymptomatic. Continue Synthroid 137 mcg daily Check TSH

## 2023-05-29 ENCOUNTER — Other Ambulatory Visit: Payer: Self-pay

## 2023-05-29 ENCOUNTER — Other Ambulatory Visit: Payer: Self-pay | Admitting: Cardiovascular Disease

## 2023-05-29 ENCOUNTER — Other Ambulatory Visit: Payer: Self-pay | Admitting: Family Medicine

## 2023-05-29 DIAGNOSIS — I255 Ischemic cardiomyopathy: Secondary | ICD-10-CM

## 2023-05-29 MED FILL — Dapagliflozin Propanediol Tab 10 MG (Base Equivalent): ORAL | 30 days supply | Qty: 30 | Fill #3 | Status: AC

## 2023-05-29 MED FILL — Carvedilol Tab 3.125 MG: ORAL | 90 days supply | Qty: 180 | Fill #0 | Status: AC

## 2023-05-29 NOTE — Telephone Encounter (Signed)
Please advise if ok to refill last filled by  Date: 5/8/2023Department: [facility]Ordering/Authorizing: Elder Negus, MD

## 2023-05-30 ENCOUNTER — Other Ambulatory Visit: Payer: Self-pay

## 2023-05-30 MED FILL — Levothyroxine Sodium Tab 137 MCG: ORAL | 30 days supply | Qty: 30 | Fill #0 | Status: AC

## 2023-06-13 ENCOUNTER — Other Ambulatory Visit (INDEPENDENT_AMBULATORY_CARE_PROVIDER_SITE_OTHER): Payer: BC Managed Care – PPO

## 2023-06-13 DIAGNOSIS — Z1159 Encounter for screening for other viral diseases: Secondary | ICD-10-CM

## 2023-06-13 DIAGNOSIS — E785 Hyperlipidemia, unspecified: Secondary | ICD-10-CM | POA: Diagnosis not present

## 2023-06-13 DIAGNOSIS — E559 Vitamin D deficiency, unspecified: Secondary | ICD-10-CM | POA: Diagnosis not present

## 2023-06-13 DIAGNOSIS — I10 Essential (primary) hypertension: Secondary | ICD-10-CM | POA: Diagnosis not present

## 2023-06-13 DIAGNOSIS — Z125 Encounter for screening for malignant neoplasm of prostate: Secondary | ICD-10-CM

## 2023-06-13 DIAGNOSIS — E039 Hypothyroidism, unspecified: Secondary | ICD-10-CM | POA: Diagnosis not present

## 2023-06-13 DIAGNOSIS — R7303 Prediabetes: Secondary | ICD-10-CM

## 2023-06-13 DIAGNOSIS — E538 Deficiency of other specified B group vitamins: Secondary | ICD-10-CM

## 2023-06-13 LAB — CBC WITH DIFFERENTIAL/PLATELET
Basophils Absolute: 0.1 10*3/uL (ref 0.0–0.1)
Basophils Relative: 0.7 % (ref 0.0–3.0)
Eosinophils Absolute: 0.2 10*3/uL (ref 0.0–0.7)
Eosinophils Relative: 1.6 % (ref 0.0–5.0)
HCT: 47.5 % (ref 39.0–52.0)
Hemoglobin: 16 g/dL (ref 13.0–17.0)
Lymphocytes Relative: 19.6 % (ref 12.0–46.0)
Lymphs Abs: 2.1 10*3/uL (ref 0.7–4.0)
MCHC: 33.7 g/dL (ref 30.0–36.0)
MCV: 95.3 fl (ref 78.0–100.0)
Monocytes Absolute: 0.7 10*3/uL (ref 0.1–1.0)
Monocytes Relative: 6.5 % (ref 3.0–12.0)
Neutro Abs: 7.8 10*3/uL — ABNORMAL HIGH (ref 1.4–7.7)
Neutrophils Relative %: 71.6 % (ref 43.0–77.0)
Platelets: 180 10*3/uL (ref 150.0–400.0)
RBC: 4.98 Mil/uL (ref 4.22–5.81)
RDW: 14.1 % (ref 11.5–15.5)
WBC: 10.9 10*3/uL — ABNORMAL HIGH (ref 4.0–10.5)

## 2023-06-13 LAB — TSH: TSH: 2.11 u[IU]/mL (ref 0.35–5.50)

## 2023-06-13 LAB — COMPREHENSIVE METABOLIC PANEL
ALT: 15 U/L (ref 0–53)
AST: 14 U/L (ref 0–37)
Albumin: 4.3 g/dL (ref 3.5–5.2)
Alkaline Phosphatase: 77 U/L (ref 39–117)
BUN: 11 mg/dL (ref 6–23)
CO2: 27 mEq/L (ref 19–32)
Calcium: 9.4 mg/dL (ref 8.4–10.5)
Chloride: 106 mEq/L (ref 96–112)
Creatinine, Ser: 1.22 mg/dL (ref 0.40–1.50)
GFR: 67.67 mL/min (ref >60.00)
Glucose, Bld: 110 mg/dL — ABNORMAL HIGH (ref 70–99)
Potassium: 4.7 mEq/L (ref 3.5–5.1)
Sodium: 140 mEq/L (ref 135–145)
Total Bilirubin: 0.6 mg/dL (ref 0.2–1.2)
Total Protein: 6.5 g/dL (ref 6.0–8.3)

## 2023-06-13 LAB — LIPID PANEL
Cholesterol: 71 mg/dL (ref 0–200)
HDL: 40.9 mg/dL (ref >39.00)
LDL Cholesterol: 18 mg/dL (ref 0–99)
NonHDL: 29.72
Total CHOL/HDL Ratio: 2
Triglycerides: 59 mg/dL (ref 0.0–149.0)
VLDL: 11.8 mg/dL (ref 0.0–40.0)

## 2023-06-13 LAB — PSA: PSA: 1.27 ng/mL (ref 0.10–4.00)

## 2023-06-13 LAB — VITAMIN D 25 HYDROXY (VIT D DEFICIENCY, FRACTURES): VITD: 24.98 ng/mL — ABNORMAL LOW (ref 30.00–100.00)

## 2023-06-13 LAB — VITAMIN B12: Vitamin B-12: 208 pg/mL — ABNORMAL LOW (ref 211–911)

## 2023-06-13 LAB — HEMOGLOBIN A1C: Hgb A1c MFr Bld: 6.5 % (ref 4.6–6.5)

## 2023-06-14 LAB — HEPATITIS C ANTIBODY: Hepatitis C Ab: NONREACTIVE

## 2023-06-15 ENCOUNTER — Emergency Department
Admission: EM | Admit: 2023-06-15 | Discharge: 2023-06-15 | Disposition: A | Discharge status: Home / Self Care | Payer: BC Managed Care – PPO | Attending: Emergency Medicine | Admitting: Emergency Medicine

## 2023-06-15 ENCOUNTER — Other Ambulatory Visit: Payer: Self-pay

## 2023-06-15 ENCOUNTER — Emergency Department: Payer: BC Managed Care – PPO

## 2023-06-15 DIAGNOSIS — I2510 Atherosclerotic heart disease of native coronary artery without angina pectoris: Secondary | ICD-10-CM | POA: Insufficient documentation

## 2023-06-15 DIAGNOSIS — I499 Cardiac arrhythmia, unspecified: Secondary | ICD-10-CM | POA: Diagnosis not present

## 2023-06-15 DIAGNOSIS — I4891 Unspecified atrial fibrillation: Secondary | ICD-10-CM | POA: Diagnosis not present

## 2023-06-15 DIAGNOSIS — I480 Paroxysmal atrial fibrillation: Secondary | ICD-10-CM

## 2023-06-15 DIAGNOSIS — R079 Chest pain, unspecified: Secondary | ICD-10-CM | POA: Diagnosis not present

## 2023-06-15 DIAGNOSIS — R000 Tachycardia, unspecified: Secondary | ICD-10-CM | POA: Diagnosis not present

## 2023-06-15 LAB — COMPREHENSIVE METABOLIC PANEL
ALT: 15 U/L (ref 0–44)
AST: 15 U/L (ref 15–41)
Albumin: 4.1 g/dL (ref 3.5–5.0)
Alkaline Phosphatase: 69 U/L (ref 38–126)
Anion gap: 13 (ref 5–15)
BUN: 19 mg/dL (ref 6–20)
CO2: 21 mmol/L — ABNORMAL LOW (ref 22–32)
Calcium: 8.6 mg/dL — ABNORMAL LOW (ref 8.9–10.3)
Chloride: 103 mmol/L (ref 98–111)
Creatinine, Ser: 1.3 mg/dL — ABNORMAL HIGH (ref 0.61–1.24)
GFR, Estimated: >60 mL/min (ref >60)
Glucose, Bld: 147 mg/dL — ABNORMAL HIGH (ref 70–99)
Potassium: 4.6 mmol/L (ref 3.5–5.1)
Sodium: 137 mmol/L (ref 135–145)
Total Bilirubin: 0.7 mg/dL (ref 0.3–1.2)
Total Protein: 6.5 g/dL (ref 6.5–8.1)

## 2023-06-15 LAB — CBC WITH DIFFERENTIAL/PLATELET
Abs Immature Granulocytes: 0.03 10*3/uL (ref 0.00–0.07)
Basophils Absolute: 0.1 10*3/uL (ref 0.0–0.1)
Basophils Relative: 1 %
Eosinophils Absolute: 0.2 10*3/uL (ref 0.0–0.5)
Eosinophils Relative: 3 %
HCT: 46.5 % (ref 39.0–52.0)
Hemoglobin: 15.9 g/dL (ref 13.0–17.0)
Immature Granulocytes: 0 %
Lymphocytes Relative: 31 %
Lymphs Abs: 2.9 10*3/uL (ref 0.7–4.0)
MCH: 31.9 pg (ref 26.0–34.0)
MCHC: 34.2 g/dL (ref 30.0–36.0)
MCV: 93.2 fL (ref 80.0–100.0)
Monocytes Absolute: 0.7 10*3/uL (ref 0.1–1.0)
Monocytes Relative: 7 %
Neutro Abs: 5.4 10*3/uL (ref 1.7–7.7)
Neutrophils Relative %: 58 %
Platelets: 181 10*3/uL (ref 150–400)
RBC: 4.99 MIL/uL (ref 4.22–5.81)
RDW: 13.5 % (ref 11.5–15.5)
WBC: 9.2 10*3/uL (ref 4.0–10.5)
nRBC: 0 % (ref 0.0–0.2)

## 2023-06-15 LAB — MAGNESIUM: Magnesium: 2 mg/dL (ref 1.7–2.4)

## 2023-06-15 LAB — T4, FREE: Free T4: 1 ng/dL (ref 0.61–1.12)

## 2023-06-15 LAB — TSH: TSH: 4.699 u[IU]/mL — ABNORMAL HIGH (ref 0.350–4.500)

## 2023-06-15 LAB — BRAIN NATRIURETIC PEPTIDE: B Natriuretic Peptide: 183.4 pg/mL — ABNORMAL HIGH (ref 0.0–100.0)

## 2023-06-15 LAB — TROPONIN I (HIGH SENSITIVITY)
Troponin I (High Sensitivity): 22 ng/L — ABNORMAL HIGH (ref <18)
Troponin I (High Sensitivity): 97 ng/L — ABNORMAL HIGH (ref <18)

## 2023-06-15 MED ORDER — APIXABAN 5 MG PO TABS
5.0000 mg | ORAL_TABLET | Freq: Two times a day (BID) | ORAL | Status: DC
  Administered 2023-06-15: 5 mg via ORAL
  Filled 2023-06-15: qty 1

## 2023-06-15 MED ORDER — APIXABAN 5 MG PO TABS
5.0000 mg | ORAL_TABLET | Freq: Two times a day (BID) | ORAL | 0 refills | Status: DC
  Filled 2023-06-15: qty 60, 30d supply, fill #0

## 2023-06-15 NOTE — ED Notes (Signed)
Pt in bed, pt states that his blood pressure always runs low and he has no dizziness, md aware of bp, pt states that he is ready to go home, d/c pt iv, cath intact, pt verbalized understanding d/c and follow up, pt ambulatory from department.

## 2023-06-15 NOTE — ED Provider Notes (Signed)
[facility] Provider Note   Event Date/Time   First MD Initiated Contact with Patient 06/15/23 [phone]     (approximate) History  Chest Pain  HPI Bobby [name] is a 52 y.o. male with a stated past medical history of CAD who presents complaining of chest pain and palpitations since this morning.  Patient states that he was unable to walk 75 yards without having to stop to catch his breath.  Patient states that he has not had symptoms similar to this in the past but has had heart attacks that presented with inability to walk this distance.  Patient also endorses associated shortness of breath that is worse with exertion ROS: Patient currently denies any vision changes, tinnitus, difficulty speaking, facial droop, sore throat, abdominal pain, nausea/vomiting/diarrhea, dysuria, or weakness/numbness/paresthesias in any extremity   Physical Exam  Triage Vital Signs: ED Triage Vitals  Enc Vitals Group     BP 06/15/23 0637 107/71     Pulse Rate 06/15/23 0637 (!) 104     Resp 06/15/23 0637 20     Temp 06/15/23 0637 97.9 F (36.6 C)     Temp Source 06/15/23 0637 Oral     SpO2 06/15/23 0637 100 %     Weight 06/15/23 0636 220 lb (99.8 kg)     Height 06/15/23 0636 6\' 1"  (1.854 m)     Head Circumference --      Peak Flow --      Pain Score 06/15/23 0636 2     Pain Loc --      Pain Edu? --      Excl. in GC? --    Most recent vital signs: Vitals:   06/15/23 1032 06/15/23 1106  BP: 92/65 99/68  Pulse: 60 (!) 59  Resp: 20 16  Temp:  97.9 F (36.6 C)  SpO2:  97%   General: Awake, oriented x4. CV:  Good peripheral perfusion.  Resp:  Normal effort.  Abd:  No distention.  Other:  Middle-aged overweight Caucasian male laying in bed in no acute distress ED Results / Procedures / Treatments  Labs (all labs ordered are listed, but only abnormal results are displayed) Labs Reviewed  COMPREHENSIVE METABOLIC PANEL - Abnormal; Notable for the following components:       Result Value   CO2 21 (*)    Glucose, Bld 147 (*)    Creatinine, Ser 1.30 (*)    Calcium 8.6 (*)    All other components within normal limits  BRAIN NATRIURETIC PEPTIDE - Abnormal; Notable for the following components:   B Natriuretic Peptide 183.4 (*)    All other components within normal limits  TSH - Abnormal; Notable for the following components:   TSH 4.699 (*)    All other components within normal limits  TROPONIN I (HIGH SENSITIVITY) - Abnormal; Notable for the following components:   Troponin I (High Sensitivity) 22 (*)    All other components within normal limits  TROPONIN I (HIGH SENSITIVITY) - Abnormal; Notable for the following components:   Troponin I (High Sensitivity) 97 (*)    All other components within normal limits  CBC WITH DIFFERENTIAL/PLATELET  MAGNESIUM  T4, FREE   EKG ED ECG REPORT I, Merwyn Katos, the attending physician, personally viewed and interpreted this ECG. Date: 06/15/2023 EKG Time: 0639 Rate: 104 Rhythm: Atrial fibrillation QRS Axis: normal Intervals: normal ST/T Wave abnormalities: normal Narrative Interpretation: Atrial fibrillation.  No evidence of acute ischemia RADIOLOGY ED MD interpretation: One-view portable  chest x-ray interpreted by me shows no evidence of acute abnormalities including no pneumonia, pneumothorax, or widened mediastinum -Agree with radiology assessment Official radiology report(s): DG Chest Portable 1 View  Result Date: 06/15/2023 CLINICAL DATA:  New onset atrial fibrillation EXAM: PORTABLE CHEST 1 VIEW COMPARISON:  06/29/2020 FINDINGS: Artifact from EKG leads. Normal heart size and mediastinal contours. No acute infiltrate or edema. No effusion or pneumothorax. No acute osseous findings. IMPRESSION: No active disease. Electronically Signed   By: Tiburcio Pea M.D.   On: 06/15/2023 07:03   PROCEDURES: Critical Care performed: No .1-3 Lead EKG Interpretation  Performed by: Merwyn Katos, MD Authorized by:  Merwyn Katos, MD     Interpretation: normal     ECG rate:  71   ECG rate assessment: normal     Rhythm: sinus rhythm     Ectopy: none     Conduction: normal    MEDICATIONS ORDERED IN ED: Medications  apixaban (ELIQUIS) tablet 5 mg (5 mg Oral Given 06/15/23 1110)   IMPRESSION / MDM / ASSESSMENT AND PLAN / ED COURSE  I reviewed the triage vital signs and the nursing notes.                             The patient is on the cardiac monitor to evaluate for evidence of arrhythmia and/or significant heart rate changes. Patient's presentation is most consistent with acute presentation with potential threat to life or bodily function. + atrial fibrillation w/ RVR DDx: Pneumothorax, Pneumonia, Pulmonary Embolus, Tamponade, ACS, Thyrotoxicosis.  No history or evidence decompensated heart failure. Given their history and exam it is likely this patient is unlikely to spontaneously revert to a rate controlled rhythm and necessitates a thorough workup for their arrhythmia. Workup: ECG, CXR, CBC, BMP, UA, Troponin, BNP, TSH, Ca-Mag-Phos Interventions: Defer Cardioversion (uncertain historical reliability with time of onset, increased risk of thromboembolic stroke).  Start diltiazem bolus and drip  Reassessment: Patient maintained NSR during multi-hour observation in ED. discussed with patient at length the possibility of placing him on anticoagulation before follow-up with his primary care physician/cardiologist.  Patient states that he has had symptoms similar to this intermittently over the past week and therefore patient will be placed on an Eliquis starter pack Disposition: Discharge home with prompt PCP follow up and cardiology referral.    FINAL CLINICAL IMPRESSION(S) / ED DIAGNOSES   Final diagnoses:  Intermittent atrial fibrillation (HCC)  Chest pain, unspecified type   Rx / DC Orders   ED Discharge Orders          Ordered    Ambulatory referral to Cardiology       Comments: If you  have not heard from the Cardiology office within the next 72 hours please call [phone].   06/15/23 1059    apixaban (ELIQUIS) 5 MG TABS tablet  2 times daily        06/15/23 1100           Note:  This document was prepared using Dragon voice recognition software and may include unintentional dictation errors.   Merwyn Katos, MD 06/15/23 [phone]

## 2023-06-15 NOTE — ED Notes (Signed)
Date and time results received: 06/15/23 1050 (use smartphrase ".now" to insert current time)  Test: Trop Critical Value: 74  Name of Provider Notified: Vicente Males

## 2023-06-15 NOTE — ED Triage Notes (Signed)
Pt presents via [facility] for CP x 1 week intermittently with more intense sensation around 0300. With EMS pt had a rhythm of afib with RVR at a rate of 150s. Pt received 2 nitros, 10mg  of diltiazem, and 324 ASA with minimal relief in pain. A&Ox4 at this time.

## 2023-06-17 DIAGNOSIS — I480 Paroxysmal atrial fibrillation: Secondary | ICD-10-CM | POA: Insufficient documentation

## 2023-06-17 NOTE — H&P (View-Only) (Signed)
[facility] Office Note  Date:  06/18/2023   ID:  Bobby [name], DOB Apr 27, 1971, MRN [id number]  PCP:  Bobby Allan, MD   Chief Complaint  Patient presents with   ED follow up-afib, new onset    Patient c/o shortness of breath, dizziness, chest heaviness & palpitations. Medications reviewed by the patient verbally.     HPI:  [name] is a 52 year old gentleman with past medical history of Hypotestosterone Hypothyroid, on supplement Hx of smoking CAD, inferior wall MI, stenting to RCA 2003 Catheterization July 2018, occluded RCA with collaterals, Anterior wall STEMI July 13, 2017, status post anterior wall MI 06/2017 s/p successful LAD revascularization EF 40 to 45% in July 2021, anterior, anteroseptal akinesis August 2021: Cardiac catheterization RCA: Prox in-stent CTO, previously known. Left-to-right collaterals Who presents for follow-up of his coronary artery disease, new diagnosis of atrial fibrillation in the ER June 15, 2023  Last seen by myself in clinic on February 24 Previously seen by cardiology June 08, 2022 [facility] in [place]  Several weeks ago,chest pain Helped friend move, had chest pain Stuttering since then June 21 developed severe pain concerning for angina, took nitro with short-term relief but symptoms came back Seen in the emergency room June 15, 2023 Reported having chest pain, shortness of breath Found to be in new onset atrial fibrillation Started on Eliquis 5 twice daily Converting to normal sinus rhythm in emergency room on diltiazem infusion  Given his stuttering chest pain, he is concerned for underlying blockage Pain at times is severe consistent with his prior anterior MI in 2018  Blood pressure low on today's visit Unable to add long-acting nitroglycerin Tolerating carvedilol 3.25 twice daily  Tolerating Lipitor and Zetia with Repatha Cholesterol at goal  EKG personally reviewed by myself on todays visit Normal  sinus rhythm rate 63 bpm c old anterior MI, old inferior MI  Other past medical history reviewed remote history of stenting x2 to the RCA 2003,  Cardiac catheterization July 13, 2017 noted to have occluded RCA, as well as 100% proximal LAD disease, 80% diagonal disease, 70% OM disease collaterals from left to right  Repeat catheterization August 21, 2017, patent LAD stent, occluded proximal RCA in-stent restenosis, 50% ramus disease  hospitalized in early July 2021 with nausea, vomiting, and diarrhea. Patient underwent EGD that showed erythematous mucosa in stomach antrum, and duodenum. Biopsy was taken. Prontonix and outpatient f/u were recommended. Pathology showed no malignancy.   Cardiac testing and imaging as below Coronary angiography 08/10/2020: LM: Normal LAD: Patent ostial LAD stent. 20% ostial diagonal stenosis LCx: 20% OM1 stenosis RCA: Prox in-stent CTO. Left-to-left collaterals fill up to mid RCA Normal LVEDP   Echocardiogram 07/16/2020:  Poor visualization of endocardial borders.  Left ventricle cavity is  normal in size and thickness. Anterior/anteroseptal akinesis. LVEF  probably 40-45%.  Indeterminate diastolic filling pattern.  No significant valvular abnormalities.  normal right atrial pressure.  No significant change compared to previous study in 2018.   Real-time Outpatient Cardiac Telemetry 04/01/2020 - 04/15/2020: Dominant rhythm: Sinus. HR 44-150 bpm. Avg HR 72 bpm. Supraventricular ectopy, PAC/upto 6 beat run. SVE burden <1% Ventricular ectopy, PVC, NSVT upto 5 beats. VE burden <1% No atrial fibrillation/atrial flutter/SVT/high grade AV block, sinus pause >3sec noted. 12 patient activated events do not correlate with arrhthymias.    Stress perfusion MRI 11/07/2017: 1. Moderately dilated left ventricle with normal wall thickness and moderately decreased systolic function (LVEF = 43%). There is hypokinesis of the basal anteroseptal,  anterior walls and  akinesis of the mid inferoseptal, anteroseptal, anterior, all apical walls including true apex. A large infarct in the basal and mid inferior, inferoseptal, anteroseptal, anterior walls and in all the apical walls with no ischemia. 2. Normal right ventricular size, thickness and systolic function (LVEF = 56%). There are no regional wall motion abnormalities. 3.  Mildly dilated left atrium. 4. Mild mitral and tricuspid regurgitation. Collectively, these findings are consistent with an ischemic cardiomyopathy with a large infarct in the LAD and RCA territory with no peri-infarct ischemia. There is poor chance of recovery if Revascularized.   Cath 08/21/2017: LM: Normal LAD: Patent ostial LAD stent. 20% ostial diagonal stenosis Ramus: 50% proximal stenosis RCA: Not engaged today. Known RCA CTO   Elevated LVEDP LVEF 45%   PMH:   has a past medical history of Acute MI anterior wall first episode care [facility]) (07/13/2017), Cardiogenic shock (HCC) (07/13/2017), CHF (congestive heart failure) (HCC), Graves disease, Hypertension, MI (myocardial infarction) (HCC), and Thyroid disease.  PSH:    Past Surgical History:  Procedure Laterality Date   CORONARY ANGIOPLASTY WITH STENT PLACEMENT     CORONARY/GRAFT ACUTE MI REVASCULARIZATION N/A 07/13/2017   Procedure: Coronary/Graft Acute MI Revascularization;  Surgeon: Yates Decamp, MD;  Location: [facility] INVASIVE CV LAB;  Service: Cardiovascular;  Laterality: N/A;   ESOPHAGOGASTRODUODENOSCOPY N/A 06/30/2020   Procedure: ESOPHAGOGASTRODUODENOSCOPY (EGD);  Surgeon: Toledo, Boykin Nearing, MD;  Location: [facility] ENDOSCOPY;  Service: Gastroenterology;  Laterality: N/A;   LEFT HEART CATH AND CORONARY ANGIOGRAPHY N/A 07/13/2017   Procedure: Left Heart Cath and Coronary Angiography;  Surgeon: Yates Decamp, MD;  Location: [facility] INVASIVE CV LAB;  Service: Cardiovascular;  Laterality: N/A;   LEFT HEART CATH AND CORONARY ANGIOGRAPHY N/A 08/21/2017   Procedure: LEFT HEART CATH AND  CORONARY ANGIOGRAPHY;  Surgeon: Elder Negus, MD;  Location: [facility] INVASIVE CV LAB;  Service: Cardiovascular;  Laterality: N/A;   LEFT HEART CATH AND CORONARY ANGIOGRAPHY N/A 08/10/2020   Procedure: LEFT HEART CATH AND CORONARY ANGIOGRAPHY;  Surgeon: Elder Negus, MD;  Location: [facility] INVASIVE CV LAB;  Service: Cardiovascular;  Laterality: N/A;   SKIN GRAFT     VENTRICULAR ASSIST DEVICE INSERTION N/A 07/13/2017   Procedure: Ventricular Assist Device Insertion;  Surgeon: Yates Decamp, MD;  Location: [facility] INVASIVE CV LAB;  Service: Cardiovascular;  Laterality: N/A;    Current Outpatient Medications  Medication Sig Dispense Refill   ALPRAZolam (XANAX) 0.5 MG tablet Take 1 tablet (0.5 mg total) by mouth at bedtime as needed. 15 tablet 0   apixaban (ELIQUIS) 5 MG TABS tablet Take 1 tablet (5 mg total) by mouth 2 (two) times daily. 60 tablet 0   aspirin 81 MG chewable tablet Chew 1 tablet (81 mg total) by mouth daily. 30 tablet 11   atorvastatin (LIPITOR) 20 MG tablet Take 1 tablet (20 mg total) by mouth daily. 90 tablet 0   carvedilol (COREG) 3.125 MG tablet Take 1 tablet (3.125 mg total) by mouth 2 (two) times daily. 180 tablet 3   dapagliflozin propanediol (FARXIGA) 10 MG TABS tablet Take 1 tablet (10 mg total) by mouth daily before breakfast. 90 tablet 2   Evolocumab (REPATHA SURECLICK) 140 MG/ML SOAJ inject 140mg  every 14 days 6 mL 4   ezetimibe (ZETIA) 10 MG tablet Take 1 tablet (10 mg total) by mouth daily. 90 tablet 0   ivabradine (CORLANOR) 5 MG TABS tablet Take 1 tablet (5 mg total) by mouth 2 (two) times daily with a meal. 180 tablet 0  levothyroxine (SYNTHROID) 137 MCG tablet Take 1 tablet (137 mcg total) by mouth daily. 30 tablet 0   nitroGLYCERIN (NITROSTAT) 0.4 MG SL tablet PLACE 1 TABLET UNDER THE TONGUE EVERY 5 MINUTES AS NEEDED FOR CHEST PAIN. 25 tablet 2   pantoprazole (PROTONIX) 40 MG tablet Take 1 tablet (40 mg total) by mouth 2 (two) times daily. 60 tablet 3    sacubitril-valsartan (ENTRESTO) 24-26 MG Take 1 tablet by mouth 2 (two) times daily. 180 tablet 0   sildenafil (VIAGRA) 100 MG tablet Take 1 tablet (100 mg total) by mouth as needed for erectile dysfunction. TAKE 1 (ONE) TABLET AS NEEDED. DO NOT USE AT SAME TIME AS NITRO 30 tablet 3   spironolactone (ALDACTONE) 25 MG tablet Take 1 tablet (25 mg total) by mouth daily. 90 tablet 0   No current facility-administered medications for this visit.    Allergies:   Patient has no known allergies.   Social History:  The patient  reports that he quit smoking about 5 years ago. His smoking use included cigarettes. He has a 10.00 pack-year smoking history. He has never used smokeless tobacco. He reports current alcohol use. He reports current drug use. Drug: Marijuana.   Family History:   family history includes Heart disease in his paternal grandmother; Heart disease (age of onset: 57) in his father; Hypertension in his father; Valvular heart disease in his mother.    Review of Systems: Review of Systems  Constitutional: Negative.   HENT: Negative.    Respiratory: Negative.    Cardiovascular: Negative.   Gastrointestinal: Negative.   Musculoskeletal: Negative.   Neurological: Negative.   Psychiatric/Behavioral: Negative.    All other systems reviewed and are negative.   PHYSICAL EXAM: VS:  BP (!) 96/58 (BP Location: Left Arm, Patient Position: Sitting, Cuff Size: Normal)   Pulse 63   Ht 6\' 1"  (1.854 m)   Wt 218 lb (98.9 kg)   SpO2 97%   BMI 28.76 kg/m  , BMI Body mass index is 28.76 kg/m. Constitutional:  oriented to person, place, and time. No distress.  HENT:  Head: Grossly normal Eyes:  no discharge. No scleral icterus.  Neck: No JVD, no carotid bruits  Cardiovascular: Regular rate and rhythm, no murmurs appreciated Pulmonary/Chest: Clear to auscultation bilaterally, no wheezes or rails Abdominal: Soft.  no distension.  no tenderness.  Musculoskeletal: Normal range of  motion Neurological:  normal muscle tone. Coordination normal. No atrophy Skin: Skin warm and dry Psychiatric: normal affect, pleasant   Recent Labs: 06/15/2023: ALT 15; B Natriuretic Peptide 183.4; BUN 19; Creatinine, Ser 1.30; Hemoglobin 15.9; Magnesium 2.0; Platelets 181; Potassium 4.6; Sodium 137; TSH 4.699    Lipid Panel Lab Results  Component Value Date   CHOL 71 06/13/2023   HDL 40.90 06/13/2023   LDLCALC 18 06/13/2023   TRIG 59.0 06/13/2023      Wt Readings from Last 3 Encounters:  06/18/23 218 lb (98.9 kg)  06/15/23 220 lb (99.8 kg)  05/23/23 216 lb (98 kg)      ASSESSMENT AND PLAN:  Problem List Items Addressed This Visit       Cardiology Problems   Paroxysmal atrial fibrillation (HCC) - Primary   HTN (hypertension)   HLD (hyperlipidemia)   Aortic atherosclerosis (HCC)   CAD (coronary artery disease)   Ischemic cardiomyopathy  Coronary artery disease with stable angina Remote stenting to the RCA, noted to be occluded on catheterization 2018 with collaterals from left to right STEMI with ostial/proximal LAD occlusion,  stent placed 2018 Mildly reduced ejection fraction on echocardiogram Worsening chest pain concerning for angina Discussed first treatment options with him, we have recommended cardiac catheterization given the severity of his pain He had a stress test 2023 with large regions of fixed defect I have reviewed the risks, indications, and alternatives to cardiac catheterization, possible angioplasty, and stenting with the patient. Risks include but are not limited to bleeding, infection, vascular injury, stroke, myocardial infection, arrhythmia, kidney injury, radiation-related injury in the case of prolonged fluoroscopy use, emergency cardiac surgery, and death. The patient understands the risks of serious complication is 1-2 in 1000 with diagnostic cardiac cath and 1-2% or less with angioplasty/stenting.  -Will try to schedule later this  week  Ischemic cardiomyopathy/chronic systolic CHF Mildly depressed ejection fraction on echo following prior MI continue low-dose carvedilol, spironolactone, Sherryll Burger, Farxiga Unable to titrate up carvedilol  Paroxysmal atrial fibrillation Episode last week, converting to normal sinus rhythm We will hold the Eliquis in preparation for catheterization Given lone episode, we may not need Eliquis on a regular basis Zio monitor ordered for 2 weeks given he has paroxysmal chest pain symptoms  Hyperlipidemia Numbers at goal on PCSK9 inhibitor.Repatha , atorvastatin, Zetia  Anxiety Managed by primary care   Total encounter time more than 40 minutes  Greater than 50% was spent in counseling and coordination of care with the patient    Signed, Dossie Arbour, M.D., Ph.D. [facility] [place], [place] [phone]

## 2023-06-17 NOTE — Progress Notes (Unsigned)
[facility] Office Note  Date:  06/18/2023   ID:  Bobby [name], DOB 11/07/1971, MRN [id number]  PCP:  Walsh, Tanya, MD   Chief Complaint  Patient presents with   ED follow up-afib, new onset    Patient c/o shortness of breath, dizziness, chest heaviness & palpitations. Medications reviewed by the patient verbally.     HPI:  [name] is a 51-year-old gentleman with past medical history of Hypotestosterone Hypothyroid, on supplement Hx of smoking CAD, inferior wall MI, stenting to RCA 2003 Catheterization July 2018, occluded RCA with collaterals, Anterior wall STEMI July 13, 2017, status post anterior wall MI 06/2017 s/p successful LAD revascularization EF 40 to 45% in July 2021, anterior, anteroseptal akinesis August 2021: Cardiac catheterization RCA: Prox in-stent CTO, previously known. Left-to-right collaterals Who presents for follow-up of his coronary artery disease, new diagnosis of atrial fibrillation in the ER June 15, 2023  Last seen by myself in clinic on February 24 Previously seen by cardiology June 08, 2022 [facility] in [place]  Several weeks ago,chest pain Helped friend move, had chest pain Stuttering since then June 21 developed severe pain concerning for angina, took nitro with short-term relief but symptoms came back Seen in the emergency room June 15, 2023 Reported having chest pain, shortness of breath Found to be in new onset atrial fibrillation Started on Eliquis 5 twice daily Converting to normal sinus rhythm in emergency room on diltiazem infusion  Given his stuttering chest pain, he is concerned for underlying blockage Pain at times is severe consistent with his prior anterior MI in 2018  Blood pressure low on today's visit Unable to add long-acting nitroglycerin Tolerating carvedilol 3.25 twice daily  Tolerating Lipitor and Zetia with Repatha Cholesterol at goal  EKG personally reviewed by myself on todays visit Normal  sinus rhythm rate 63 bpm c old anterior MI, old inferior MI  Other past medical history reviewed remote history of stenting x2 to the RCA 2003,  Cardiac catheterization July 13, 2017 noted to have occluded RCA, as well as 100% proximal LAD disease, 80% diagonal disease, 70% OM disease collaterals from left to right  Repeat catheterization August 21, 2017, patent LAD stent, occluded proximal RCA in-stent restenosis, 50% ramus disease  hospitalized in early July 2021 with nausea, vomiting, and diarrhea. Patient underwent EGD that showed erythematous mucosa in stomach antrum, and duodenum. Biopsy was taken. Prontonix and outpatient f/u were recommended. Pathology showed no malignancy.   Cardiac testing and imaging as below Coronary angiography 08/10/2020: LM: Normal LAD: Patent ostial LAD stent. 20% ostial diagonal stenosis LCx: 20% OM1 stenosis RCA: Prox in-stent CTO. Left-to-left collaterals fill up to mid RCA Normal LVEDP   Echocardiogram 07/16/2020:  Poor visualization of endocardial borders.  Left ventricle cavity is  normal in size and thickness. Anterior/anteroseptal akinesis. LVEF  probably 40-45%.  Indeterminate diastolic filling pattern.  No significant valvular abnormalities.  normal right atrial pressure.  No significant change compared to previous study in 2018.   Real-time Outpatient Cardiac Telemetry 04/01/2020 - 04/15/2020: Dominant rhythm: Sinus. HR 44-150 bpm. Avg HR 72 bpm. Supraventricular ectopy, PAC/upto 6 beat run. SVE burden <1% Ventricular ectopy, PVC, NSVT upto 5 beats. VE burden <1% No atrial fibrillation/atrial flutter/SVT/high grade AV block, sinus pause >3sec noted. 12 patient activated events do not correlate with arrhthymias.    Stress perfusion MRI 11/07/2017: 1. Moderately dilated left ventricle with normal wall thickness and moderately decreased systolic function (LVEF = 43%). There is hypokinesis of the basal anteroseptal,   anterior walls and  akinesis of the mid inferoseptal, anteroseptal, anterior, all apical walls including true apex. A large infarct in the basal and mid inferior, inferoseptal, anteroseptal, anterior walls and in all the apical walls with no ischemia. 2. Normal right ventricular size, thickness and systolic function (LVEF = 56%). There are no regional wall motion abnormalities. 3.  Mildly dilated left atrium. 4. Mild mitral and tricuspid regurgitation. Collectively, these findings are consistent with an ischemic cardiomyopathy with a large infarct in the LAD and RCA territory with no peri-infarct ischemia. There is poor chance of recovery if Revascularized.   Cath 08/21/2017: LM: Normal LAD: Patent ostial LAD stent. 20% ostial diagonal stenosis Ramus: 50% proximal stenosis RCA: Not engaged today. Known RCA CTO   Elevated LVEDP LVEF 45%   PMH:   has a past medical history of Acute MI anterior wall first episode care (HCC) (07/13/2017), Cardiogenic shock (HCC) (07/13/2017), CHF (congestive heart failure) (HCC), Graves disease, Hypertension, MI (myocardial infarction) (HCC), and Thyroid disease.  PSH:    Past Surgical History:  Procedure Laterality Date   CORONARY ANGIOPLASTY WITH STENT PLACEMENT     CORONARY/GRAFT ACUTE MI REVASCULARIZATION N/A 07/13/2017   Procedure: Coronary/Graft Acute MI Revascularization;  Surgeon: Ganji, Jay, MD;  [facility];  Service: Cardiovascular;  Laterality: N/A;   ESOPHAGOGASTRODUODENOSCOPY N/A 06/30/2020   Procedure: ESOPHAGOGASTRODUODENOSCOPY (EGD);  Surgeon: Toledo, Teodoro K, MD;  Location: [facility];  Service: Gastroenterology;  Laterality: N/A;   LEFT HEART CATH AND CORONARY ANGIOGRAPHY N/A 07/13/2017   Procedure: Left Heart Cath and Coronary Angiography;  Surgeon: Ganji, Jay, MD;  [facility]: [facility];  Service: Cardiovascular;  Laterality: N/A;   LEFT HEART CATH AND CORONARY ANGIOGRAPHY N/A 08/21/2017   Procedure: LEFT HEART CATH AND  CORONARY ANGIOGRAPHY;  Surgeon: Patwardhan, Manish J, MD;  [facility]: [facility];  Service: Cardiovascular;  Laterality: N/A;   LEFT HEART CATH AND CORONARY ANGIOGRAPHY N/A 08/10/2020   Procedure: LEFT HEART CATH AND CORONARY ANGIOGRAPHY;  Surgeon: Patwardhan, Manish J, MD;  [facility];  Service: Cardiovascular;  Laterality: N/A;   SKIN GRAFT     VENTRICULAR ASSIST DEVICE INSERTION N/A 07/13/2017   Procedure: Ventricular Assist Device Insertion;  Surgeon: Ganji, Jay, MD;  [facility];  Service: Cardiovascular;  Laterality: N/A;    Current Outpatient Medications  Medication Sig Dispense Refill   ALPRAZolam (XANAX) 0.5 MG tablet Take 1 tablet (0.5 mg total) by mouth at bedtime as needed. 15 tablet 0   apixaban (ELIQUIS) 5 MG TABS tablet Take 1 tablet (5 mg total) by mouth 2 (two) times daily. 60 tablet 0   aspirin 81 MG chewable tablet Chew 1 tablet (81 mg total) by mouth daily. 30 tablet 11   atorvastatin (LIPITOR) 20 MG tablet Take 1 tablet (20 mg total) by mouth daily. 90 tablet 0   carvedilol (COREG) 3.125 MG tablet Take 1 tablet (3.125 mg total) by mouth 2 (two) times daily. 180 tablet 3   dapagliflozin propanediol (FARXIGA) 10 MG TABS tablet Take 1 tablet (10 mg total) by mouth daily before breakfast. 90 tablet 2   Evolocumab (REPATHA SURECLICK) 140 MG/ML SOAJ inject 140mg every 14 days 6 mL 4   ezetimibe (ZETIA) 10 MG tablet Take 1 tablet (10 mg total) by mouth daily. 90 tablet 0   ivabradine (CORLANOR) 5 MG TABS tablet Take 1 tablet (5 mg total) by mouth 2 (two) times daily with a meal. 180 tablet 0     levothyroxine (SYNTHROID) 137 MCG tablet Take 1 tablet (137 mcg total) by mouth daily. 30 tablet 0   nitroGLYCERIN (NITROSTAT) 0.4 MG SL tablet PLACE 1 TABLET UNDER THE TONGUE EVERY 5 MINUTES AS NEEDED FOR CHEST PAIN. 25 tablet 2   pantoprazole (PROTONIX) 40 MG tablet Take 1 tablet (40 mg total) by mouth 2 (two) times daily. 60 tablet 3    sacubitril-valsartan (ENTRESTO) 24-26 MG Take 1 tablet by mouth 2 (two) times daily. 180 tablet 0   sildenafil (VIAGRA) 100 MG tablet Take 1 tablet (100 mg total) by mouth as needed for erectile dysfunction. TAKE 1 (ONE) TABLET AS NEEDED. DO NOT USE AT SAME TIME AS NITRO 30 tablet 3   spironolactone (ALDACTONE) 25 MG tablet Take 1 tablet (25 mg total) by mouth daily. 90 tablet 0   No current facility-administered medications for this visit.    Allergies:   Patient has no known allergies.   Social History:  The patient  reports that he quit smoking about 5 years ago. His smoking use included cigarettes. He has a 10.00 pack-year smoking history. He has never used smokeless tobacco. He reports current alcohol use. He reports current drug use. Drug: Marijuana.   Family History:   family history includes Heart disease in his paternal grandmother; Heart disease (age of onset: 49) in his father; Hypertension in his father; Valvular heart disease in his mother.    Review of Systems: Review of Systems  Constitutional: Negative.   HENT: Negative.    Respiratory: Negative.    Cardiovascular: Negative.   Gastrointestinal: Negative.   Musculoskeletal: Negative.   Neurological: Negative.   Psychiatric/Behavioral: Negative.    All other systems reviewed and are negative.   PHYSICAL EXAM: VS:  BP (!) 96/58 (BP Location: Left Arm, Patient Position: Sitting, Cuff Size: Normal)   Pulse 63   Ht 6' 1" (1.854 m)   Wt 218 lb (98.9 kg)   SpO2 97%   BMI 28.76 kg/m  , BMI Body mass index is 28.76 kg/m. Constitutional:  oriented to person, place, and time. No distress.  HENT:  Head: Grossly normal Eyes:  no discharge. No scleral icterus.  Neck: No JVD, no carotid bruits  Cardiovascular: Regular rate and rhythm, no murmurs appreciated Pulmonary/Chest: Clear to auscultation bilaterally, no wheezes or rails Abdominal: Soft.  no distension.  no tenderness.  Musculoskeletal: Normal range of  motion Neurological:  normal muscle tone. Coordination normal. No atrophy Skin: Skin warm and dry Psychiatric: normal affect, pleasant   Recent Labs: 06/15/2023: ALT 15; B Natriuretic Peptide 183.4; BUN 19; Creatinine, Ser 1.30; Hemoglobin 15.9; Magnesium 2.0; Platelets 181; Potassium 4.6; Sodium 137; TSH 4.699    Lipid Panel Lab Results  Component Value Date   CHOL 71 06/13/2023   HDL 40.90 06/13/2023   LDLCALC 18 06/13/2023   TRIG 59.0 06/13/2023      Wt Readings from Last 3 Encounters:  06/18/23 218 lb (98.9 kg)  06/15/23 220 lb (99.8 kg)  05/23/23 216 lb (98 kg)      ASSESSMENT AND PLAN:  Problem List Items Addressed This Visit       Cardiology Problems   Paroxysmal atrial fibrillation (HCC) - Primary   HTN (hypertension)   HLD (hyperlipidemia)   Aortic atherosclerosis (HCC)   CAD (coronary artery disease)   Ischemic cardiomyopathy  Coronary artery disease with stable angina Remote stenting to the RCA, noted to be occluded on catheterization 2018 with collaterals from left to right STEMI with ostial/proximal LAD occlusion,   stent placed 2018 Mildly reduced ejection fraction on echocardiogram Worsening chest pain concerning for angina Discussed first treatment options with him, we have recommended cardiac catheterization given the severity of his pain He had a stress test 2023 with large regions of fixed defect I have reviewed the risks, indications, and alternatives to cardiac catheterization, possible angioplasty, and stenting with the patient. Risks include but are not limited to bleeding, infection, vascular injury, stroke, myocardial infection, arrhythmia, kidney injury, radiation-related injury in the case of prolonged fluoroscopy use, emergency cardiac surgery, and death. The patient understands the risks of serious complication is 1-2 in 1000 with diagnostic cardiac cath and 1-2% or less with angioplasty/stenting.  -Will try to schedule later this  week  Ischemic cardiomyopathy/chronic systolic CHF Mildly depressed ejection fraction on echo following prior MI continue low-dose carvedilol, spironolactone, Entresto, Farxiga Unable to titrate up carvedilol  Paroxysmal atrial fibrillation Episode last week, converting to normal sinus rhythm We will hold the Eliquis in preparation for catheterization Given lone episode, we may not need Eliquis on a regular basis Zio monitor ordered for 2 weeks given he has paroxysmal chest pain symptoms  Hyperlipidemia Numbers at goal on PCSK9 inhibitor.Repatha , atorvastatin, Zetia  Anxiety Managed by primary care   Total encounter time more than 40 minutes  Greater than 50% was spent in counseling and coordination of care with the patient    Signed, Tim Ashutosh Dieguez, M.D., Ph.D. [facility], [place] [phone] 

## 2023-06-18 ENCOUNTER — Ambulatory Visit: Payer: BC Managed Care – PPO

## 2023-06-18 ENCOUNTER — Other Ambulatory Visit: Payer: Self-pay | Admitting: Cardiovascular Disease

## 2023-06-18 ENCOUNTER — Encounter: Payer: Self-pay | Admitting: Cardiovascular Disease

## 2023-06-18 ENCOUNTER — Ambulatory Visit: Payer: BC Managed Care – PPO | Attending: Cardiovascular Disease | Admitting: Cardiovascular Disease

## 2023-06-18 VITALS — BP 96/58 | HR 63 | Ht 73.0 in | Wt 218.0 lb

## 2023-06-18 DIAGNOSIS — I25118 Atherosclerotic heart disease of native coronary artery with other forms of angina pectoris: Secondary | ICD-10-CM

## 2023-06-18 DIAGNOSIS — I2089 Other forms of angina pectoris: Secondary | ICD-10-CM

## 2023-06-18 DIAGNOSIS — I480 Paroxysmal atrial fibrillation: Secondary | ICD-10-CM

## 2023-06-18 DIAGNOSIS — E782 Mixed hyperlipidemia: Secondary | ICD-10-CM

## 2023-06-18 DIAGNOSIS — I255 Ischemic cardiomyopathy: Secondary | ICD-10-CM | POA: Diagnosis not present

## 2023-06-18 DIAGNOSIS — I700 Atherosclerosis of aorta: Secondary | ICD-10-CM

## 2023-06-18 DIAGNOSIS — I200 Unstable angina: Secondary | ICD-10-CM | POA: Insufficient documentation

## 2023-06-18 DIAGNOSIS — I10 Essential (primary) hypertension: Secondary | ICD-10-CM

## 2023-06-18 MED ORDER — SODIUM CHLORIDE 0.9% FLUSH
3.0000 mL | Freq: Two times a day (BID) | INTRAVENOUS | Status: DC
Start: 2023-06-18 — End: 2023-06-18

## 2023-06-18 NOTE — Patient Instructions (Addendum)
Medication Instructions:  Stop Eliquis until further advised.  If you need a refill on your cardiac medications before your next appointment, please call your pharmacy.   Lab work: No new labs needed  Testing/Procedures:  Your physician has recommended that you wear a Zio monitor 14 days.   This monitor is a medical device that records the heart's electrical activity. Doctors most often use these monitors to diagnose arrhythmias. Arrhythmias are problems with the speed or rhythm of the heartbeat. The monitor is a small device applied to your chest. You can wear one while you do your normal daily activities. While wearing this monitor if you have any symptoms to push the button and record what you felt. Once you have worn this monitor for the period of time provider prescribed (Usually 14 days), you will return the monitor device in the postage paid box. Once it is returned they will download the data collected and provide [place] with a report which the provider will then review and we will call you with those results. Important tips:  Avoid showering during the first 24 hours of wearing the monitor. Avoid excessive sweating to help maximize wear time. Do not submerge the device, no hot tubs, and no swimming pools. Keep any lotions or oils away from the patch. After 24 hours you may shower with the patch on. Take brief showers with your back facing the shower head.  Do not remove patch once it has been placed because that will interrupt data and decrease adhesive wear time. Push the button when you have any symptoms and write down what you were feeling. Once you have completed wearing your monitor, remove and place into box which has postage paid and place in your outgoing mailbox.  If for some reason you have misplaced your box then call our office and we can provide another box and/or mail it off for you.     Follow-Up: At [facility], you and your health needs are our priority.  As part  of our continuing mission to provide you with exceptional heart care, we have created designated Provider Care Teams.  These Care Teams include your primary Cardiologist (physician) and Advanced Practice Providers (APPs -  Physician Assistants and Nurse Practitioners) who all work together to provide you with the care you need, when you need it.  You will need a follow up appointment in 3 weeks.  Providers on your designated Care Team:   Nicolasa Ducking, NP Eula Listen, PA-C Cadence Fransico Michael, PA-C    [facility] A DEPT OF [place]. [facility] AT [facility] [address] [place] [address] Dept: [phone] Loc: [phone]  Timarion Agcaoili  06/18/2023  You are scheduled for a Cardiac Catheterization on Friday, June 28 with Dr. Cristal Deer End.  1. Please arrive at the [facility] Entrance of [facility], [address], [place] [address] at 6:30 AM (This is 1 hour(s) prior to your procedure time).  Proceed to the Check-In Desk directly inside the entrance.  Procedure Parking: Use the entrance off of the [address] side of the hospital. Turn right upon entering and follow the driveway to parking that is directly in front of the [facility] Vascular Center. There is no valet parking available at this entrance, however there is an awning directly in front of the [facility] Vascular Center for drop off/ pick up for patients.  Special note: Every effort is made to have your  procedure done on time. Please understand that emergencies sometimes delay scheduled procedures.  2. Diet: Do not eat solid foods after midnight.  The patient may have clear liquids until 5am upon the day of the procedure.  3. Labs: completed  4. Medication instructions in preparation for your procedure:   Contrast Allergy: No   Hold Spironolactone and Farxiga the morning of the procedure.    On the morning of your procedure, take your  Aspirin 81 mg and any morning medicines NOT listed above.  You may use sips of water.  5. Plan to go home the same day, you will only stay overnight if medically necessary. 6. Bring a current list of your medications and current insurance cards. 7. You MUST have a responsible person to drive you home. 8. Someone MUST be with you the first 24 hours after you arrive home or your discharge will be delayed. 9. Please wear clothes that are easy to get on and off and wear slip-on shoes.  Thank you for allowing [place] to care for you!   -- [facility]

## 2023-06-19 NOTE — Progress Notes (Signed)
SUBJECTIVE:   Chief Complaint  Patient presents with   Annual Exam    No concerns   HPI Patient presents to clinic annual physical exam.  Concerns today include following.  New onset accessible AFIb Status post cardioversion 06/21.  Anticoagulation initiated and now taking Eliquis 5 mg twice daily.  Denies any signs of bleeding.  Denies any recent chest pain, heart palpitations or flutters.  Reports cardiac cath planned for 06/28.    Hypothyroid Asymptomatic.  Currently takes levothyroxine 137 mcg daily and tolerating well.  Recent TSH 06/13/2023 2.11, repeat TSH 06/15/2023 during episode of A-fib 4.699.  Likely falsely elevated.  Will repeat TSH in 3 months.  Continue current dose levothyroxine 137 mcg daily.  Refill sent x 90 tablets  Hyperlipidemia On Lipitor 20 mg daily, Repatha 140 mg injection every 14 days and Zetia 10 mg daily.  Unable to tolerate high-dose statin secondary to myalgia.  Follows with cardiology  Hypertension Asymptomatic.  Well-controlled on current medication.  HFrEF Asymptomatic.  Euvolemic on exam. Tolerating current medications. Follows with Dr. Mariah Milling.  Mood disorder Chronic.   Reports taking 1 dose of of Xanax 0.5 mg and did not like side effects.  Patient offered to give back to provider as he did not want to continue medication.  Discussed with patient that provider unable to accept medications and best way to dispense the medication was to return to his pharmacy for proper dispensing.  No recent falls   PERTINENT PMH / PSH: New onset Afib on Eliquis CAD HFrEF Hypothyroid Graves' disease GERD Hypertension Prediabetes History MI x 2, 2003, 2019   OBJECTIVE:  BP 94/62 (BP Location: Left Arm)   Pulse 65   Temp 97.6 F (36.4 C)   Ht 6\' 1"  (1.854 m)   Wt 215 lb 3.2 oz (97.6 kg)   SpO2 95%   BMI 28.39 kg/m    Physical Exam Vitals reviewed.  Constitutional:      Appearance: Normal appearance.  HENT:     Head: Normocephalic.      Right Ear: Tympanic membrane, ear canal and external ear normal.     Left Ear: Tympanic membrane, ear canal and external ear normal.     Nose: Nose normal.     Mouth/Throat:     Mouth: Mucous membranes are moist.  Eyes:     Conjunctiva/sclera: Conjunctivae normal.     Pupils: Pupils are equal, round, and reactive to light.  Neck:     Thyroid: No thyromegaly or thyroid tenderness.     Vascular: No carotid bruit.  Cardiovascular:     Rate and Rhythm: Normal rate and regular rhythm.     Pulses: Normal pulses.     Heart sounds: Normal heart sounds.  Pulmonary:     Effort: Pulmonary effort is normal.     Breath sounds: Normal breath sounds.  Abdominal:     General: Abdomen is flat. Bowel sounds are normal.     Palpations: Abdomen is soft.  Musculoskeletal:        General: Normal range of motion.     Cervical back: Normal range of motion and neck supple.     Right lower leg: No edema.     Left lower leg: No edema.  Lymphadenopathy:     Cervical: No cervical adenopathy.  Neurological:     Mental Status: He is alert.  Psychiatric:        Mood and Affect: Mood normal.        Behavior: Behavior  normal.        Thought Content: Thought content normal.        Judgment: Judgment normal.       06/20/2023    1:08 PM 05/23/2023    2:33 PM 10/15/2017    4:00 PM 09/10/2017   12:55 PM 09/10/2017   12:49 PM  Depression screen PHQ 2/9  Decreased Interest 1 1 1  2   Down, Depressed, Hopeless 1 1 0  2  PHQ - 2 Score 2 2 1  4   Altered sleeping 1 2 0  2  Tired, decreased energy 1 3 1  3   Change in appetite 1 3 0  0  Feeling bad or failure about yourself  1 0 0  2  Trouble concentrating 1 3 1  2   Moving slowly or fidgety/restless 1 0 0  0  Suicidal thoughts 0 0 0 -- 1  PHQ-9 Score 8 13 3  14   Difficult doing work/chores Somewhat difficult Somewhat difficult   Somewhat difficult        06/20/2023    1:08 PM 05/23/2023    2:33 PM  GAD 7 : Generalized Anxiety Score  Nervous, Anxious, on  Edge 3 1  Control/stop worrying 0 0  Worry too much - different things 1 0  Trouble relaxing 3 2  Restless 3 2  Easily annoyed or irritable 1 2  Afraid - awful might happen 0 1  Total GAD 7 Score 11 8  Anxiety Difficulty Somewhat difficult Somewhat difficult     ASSESSMENT/PLAN:  Annual physical exam Assessment & Plan: HCM Hepatitis C/HIV screening completed PSA screening completed.  PSA normal Colonoscopy up-to-date.  Polyp removed.  Recommend 5-year follow-up.  Due 02/2027.  Follows with Dr. Elnoria Howard Shingles vaccine up-to-date Tdap up-to-date. Recommend pneumonia 20 vaccine.   Hypothyroidism, unspecified type Assessment & Plan: Chronic.  Recent TSH within normal limits on 06/19.  During admission for new onset A-fib had increased TSH 4.699 on 06/21.  Likely falsely elevated Continue Synthroid 137 mcg daily, refill x 90 tabs Check TSH in 3 months    Orders: -     TSH; Future  Mood disorder [facility]) Assessment & Plan: Chronic.   Did not like side effect of Xanax. Discussed with patient provider could not take back medication.  Recommend he return to pharmacy and use medications for proper dispensing Discontinue Xanax Encourage CBT Offered SSRI, declined as did not like side effects from previous SSRIs Offered psychiatry evaluation, politely declined at this time Mental health resources provided Follow-up as needed    Paroxysmal atrial fibrillation [facility]) Assessment & Plan: New onset.  Not currently symptomatic.  Recently started anticoagulation therapy Taking Eliquis 5 mg twice daily and tolerating well.  No signs of bleeding. Plan for PTCA 06/28 Follow-up with cardiology as scheduled Strict return precautions provided.     PDMP reviewed  Return if symptoms worsen or fail to improve, for PCP.  Dana Allan, MD

## 2023-06-20 ENCOUNTER — Ambulatory Visit (INDEPENDENT_AMBULATORY_CARE_PROVIDER_SITE_OTHER): Payer: BC Managed Care – PPO | Admitting: Family Medicine

## 2023-06-20 ENCOUNTER — Encounter: Payer: Self-pay | Admitting: Family Medicine

## 2023-06-20 VITALS — BP 94/62 | HR 65 | Temp 97.6°F | Ht 73.0 in | Wt 215.2 lb

## 2023-06-20 DIAGNOSIS — I480 Paroxysmal atrial fibrillation: Secondary | ICD-10-CM | POA: Diagnosis not present

## 2023-06-20 DIAGNOSIS — E039 Hypothyroidism, unspecified: Secondary | ICD-10-CM

## 2023-06-20 DIAGNOSIS — F39 Unspecified mood [affective] disorder: Secondary | ICD-10-CM

## 2023-06-20 DIAGNOSIS — Z0000 Encounter for general adult medical examination without abnormal findings: Secondary | ICD-10-CM

## 2023-06-20 NOTE — Patient Instructions (Addendum)
It was a pleasure meeting you today. Thank you for allowing me to take part in your health care.  Our goals for today as we discussed include:  Will recheck your thyroid in 3 months  Check out the following to help with stress relief [email or web address] [address] [place] [phone]   Follow up with Cardiology  If any chest pain, shortness of breath or increased heart rate before your heart cath on Friday call 911   I value your feedback and you entrusting [place] with your care. If you get a [facility] patient survey, I would appreciate you taking the time to let us know about your experience today. Thank you!        If you have any questions or concerns, please do not hesitate to call the office at [phone].  I look forward to our next visit and until then take care and stay safe.  Regards,   Dana Allan, MD   [facility]

## 2023-06-22 ENCOUNTER — Other Ambulatory Visit: Payer: Self-pay

## 2023-06-22 ENCOUNTER — Ambulatory Visit
Admission: RE | Admit: 2023-06-22 | Discharge: 2023-06-22 | Disposition: A | Discharge status: Other Acute Inpatient Hospital | Payer: BC Managed Care – PPO | Attending: Internal Medicine | Admitting: Internal Medicine

## 2023-06-22 ENCOUNTER — Encounter (HOSPITAL_COMMUNITY): Payer: Self-pay | Admitting: Internal Medicine

## 2023-06-22 ENCOUNTER — Ambulatory Visit (HOSPITAL_BASED_OUTPATIENT_CLINIC_OR_DEPARTMENT_OTHER)
Admission: RE | Admit: 2023-06-22 | Discharge: 2023-06-22 | Disposition: A | Discharge status: Home / Self Care | Payer: BC Managed Care – PPO | Source: Home / Self Care | Attending: Internal Medicine | Admitting: Internal Medicine

## 2023-06-22 ENCOUNTER — Encounter
Admission: RE | Disposition: A | Discharge status: Other Acute Inpatient Hospital | Payer: BC Managed Care – PPO | Source: Home / Self Care | Attending: Internal Medicine

## 2023-06-22 ENCOUNTER — Encounter: Payer: Self-pay | Admitting: Internal Medicine

## 2023-06-22 ENCOUNTER — Inpatient Hospital Stay (HOSPITAL_COMMUNITY)
Admission: RE | Admit: 2023-06-22 | Discharge: 2023-06-29 | DRG: 236 | Disposition: A | Discharge status: Home / Self Care | Payer: BC Managed Care – PPO | Source: Ambulatory Visit | Attending: Thoracic Surgery (Cardiothoracic Vascular Surgery) | Admitting: Thoracic Surgery (Cardiothoracic Vascular Surgery)

## 2023-06-22 ENCOUNTER — Encounter: Payer: Self-pay | Admitting: Family Medicine

## 2023-06-22 ENCOUNTER — Encounter (HOSPITAL_COMMUNITY): Payer: Self-pay

## 2023-06-22 DIAGNOSIS — Z8249 Family history of ischemic heart disease and other diseases of the circulatory system: Secondary | ICD-10-CM | POA: Diagnosis not present

## 2023-06-22 DIAGNOSIS — K219 Gastro-esophageal reflux disease without esophagitis: Secondary | ICD-10-CM | POA: Diagnosis not present

## 2023-06-22 DIAGNOSIS — E039 Hypothyroidism, unspecified: Secondary | ICD-10-CM | POA: Diagnosis present

## 2023-06-22 DIAGNOSIS — Z955 Presence of coronary angioplasty implant and graft: Secondary | ICD-10-CM | POA: Diagnosis not present

## 2023-06-22 DIAGNOSIS — Z79899 Other long term (current) drug therapy: Secondary | ICD-10-CM

## 2023-06-22 DIAGNOSIS — I200 Unstable angina: Secondary | ICD-10-CM | POA: Diagnosis not present

## 2023-06-22 DIAGNOSIS — Z7901 Long term (current) use of anticoagulants: Secondary | ICD-10-CM

## 2023-06-22 DIAGNOSIS — I480 Paroxysmal atrial fibrillation: Secondary | ICD-10-CM | POA: Diagnosis present

## 2023-06-22 DIAGNOSIS — I5022 Chronic systolic (congestive) heart failure: Secondary | ICD-10-CM | POA: Diagnosis not present

## 2023-06-22 DIAGNOSIS — Z951 Presence of aortocoronary bypass graft: Secondary | ICD-10-CM

## 2023-06-22 DIAGNOSIS — R918 Other nonspecific abnormal finding of lung field: Secondary | ICD-10-CM | POA: Diagnosis not present

## 2023-06-22 DIAGNOSIS — D62 Acute posthemorrhagic anemia: Secondary | ICD-10-CM | POA: Diagnosis not present

## 2023-06-22 DIAGNOSIS — I252 Old myocardial infarction: Secondary | ICD-10-CM

## 2023-06-22 DIAGNOSIS — I25110 Atherosclerotic heart disease of native coronary artery with unstable angina pectoris: Principal | ICD-10-CM | POA: Diagnosis present

## 2023-06-22 DIAGNOSIS — I255 Ischemic cardiomyopathy: Secondary | ICD-10-CM | POA: Diagnosis present

## 2023-06-22 DIAGNOSIS — Z7982 Long term (current) use of aspirin: Secondary | ICD-10-CM | POA: Diagnosis not present

## 2023-06-22 DIAGNOSIS — Z4682 Encounter for fitting and adjustment of non-vascular catheter: Secondary | ICD-10-CM | POA: Diagnosis not present

## 2023-06-22 DIAGNOSIS — Z79890 Hormone replacement therapy (postmenopausal): Secondary | ICD-10-CM | POA: Diagnosis not present

## 2023-06-22 DIAGNOSIS — Z1152 Encounter for screening for COVID-19: Secondary | ICD-10-CM

## 2023-06-22 DIAGNOSIS — E785 Hyperlipidemia, unspecified: Secondary | ICD-10-CM | POA: Diagnosis not present

## 2023-06-22 DIAGNOSIS — N179 Acute kidney failure, unspecified: Secondary | ICD-10-CM | POA: Diagnosis not present

## 2023-06-22 DIAGNOSIS — I2582 Chronic total occlusion of coronary artery: Secondary | ICD-10-CM | POA: Insufficient documentation

## 2023-06-22 DIAGNOSIS — Z87891 Personal history of nicotine dependence: Secondary | ICD-10-CM

## 2023-06-22 DIAGNOSIS — I110 Hypertensive heart disease with heart failure: Secondary | ICD-10-CM | POA: Diagnosis present

## 2023-06-22 DIAGNOSIS — J90 Pleural effusion, not elsewhere classified: Secondary | ICD-10-CM | POA: Diagnosis not present

## 2023-06-22 DIAGNOSIS — J9811 Atelectasis: Secondary | ICD-10-CM | POA: Diagnosis not present

## 2023-06-22 DIAGNOSIS — Z48812 Encounter for surgical aftercare following surgery on the circulatory system: Secondary | ICD-10-CM | POA: Diagnosis not present

## 2023-06-22 DIAGNOSIS — I70203 Unspecified atherosclerosis of native arteries of extremities, bilateral legs: Secondary | ICD-10-CM | POA: Diagnosis not present

## 2023-06-22 DIAGNOSIS — I2489 Other forms of acute ischemic heart disease: Secondary | ICD-10-CM | POA: Diagnosis not present

## 2023-06-22 DIAGNOSIS — Z01810 Encounter for preprocedural cardiovascular examination: Secondary | ICD-10-CM | POA: Diagnosis not present

## 2023-06-22 DIAGNOSIS — I2510 Atherosclerotic heart disease of native coronary artery without angina pectoris: Secondary | ICD-10-CM | POA: Diagnosis not present

## 2023-06-22 DIAGNOSIS — I5020 Unspecified systolic (congestive) heart failure: Secondary | ICD-10-CM | POA: Diagnosis present

## 2023-06-22 DIAGNOSIS — D6959 Other secondary thrombocytopenia: Secondary | ICD-10-CM | POA: Diagnosis present

## 2023-06-22 DIAGNOSIS — F419 Anxiety disorder, unspecified: Secondary | ICD-10-CM | POA: Diagnosis not present

## 2023-06-22 DIAGNOSIS — R7303 Prediabetes: Secondary | ICD-10-CM | POA: Diagnosis present

## 2023-06-22 DIAGNOSIS — I517 Cardiomegaly: Secondary | ICD-10-CM | POA: Diagnosis not present

## 2023-06-22 DIAGNOSIS — R0602 Shortness of breath: Secondary | ICD-10-CM | POA: Diagnosis not present

## 2023-06-22 DIAGNOSIS — Z7984 Long term (current) use of oral hypoglycemic drugs: Secondary | ICD-10-CM

## 2023-06-22 DIAGNOSIS — I700 Atherosclerosis of aorta: Secondary | ICD-10-CM | POA: Diagnosis not present

## 2023-06-22 DIAGNOSIS — I509 Heart failure, unspecified: Secondary | ICD-10-CM | POA: Diagnosis not present

## 2023-06-22 DIAGNOSIS — I4891 Unspecified atrial fibrillation: Secondary | ICD-10-CM | POA: Insufficient documentation

## 2023-06-22 DIAGNOSIS — I10 Essential (primary) hypertension: Secondary | ICD-10-CM | POA: Diagnosis present

## 2023-06-22 HISTORY — PX: ABDOMINAL AORTOGRAM: CATH118222

## 2023-06-22 HISTORY — PX: RIGHT/LEFT HEART CATH AND CORONARY ANGIOGRAPHY: CATH118266

## 2023-06-22 LAB — POCT I-STAT 7, (LYTES, BLD GAS, ICA,H+H)
Acid-base deficit: 2 mmol/L (ref 0.0–2.0)
Bicarbonate: 22.4 mmol/L (ref 20.0–28.0)
Calcium, Ion: 1.2 mmol/L (ref 1.15–1.40)
HCT: 44 % (ref 39.0–52.0)
Hemoglobin: 15 g/dL (ref 13.0–17.0)
O2 Saturation: 99 %
Potassium: 4 mmol/L (ref 3.5–5.1)
Sodium: 137 mmol/L (ref 135–145)
TCO2: 24 mmol/L (ref 22–32)
pCO2 arterial: 37.6 mmHg (ref 32–48)
pH, Arterial: 7.383 (ref 7.35–7.45)
pO2, Arterial: 118 mmHg — ABNORMAL HIGH (ref 83–108)

## 2023-06-22 LAB — ECHOCARDIOGRAM COMPLETE
AR max vel: 2.63 cm2
AV Area VTI: 2.96 cm2
AV Area mean vel: 2.72 cm2
AV Mean grad: 2 mmHg
AV Peak grad: 3.4 mmHg
Ao pk vel: 0.92 m/s
Area-P 1/2: 3.31 cm2
Calc EF: 19.6 %
Height: 73 in
MV VTI: 2.08 cm2
S' Lateral: 4 cm
Single Plane A2C EF: 13.9 %
Single Plane A4C EF: 29.3 %
Weight: 3440 oz

## 2023-06-22 LAB — POCT I-STAT EG7
Acid-base deficit: 1 mmol/L (ref 0.0–2.0)
Bicarbonate: 24.9 mmol/L (ref 20.0–28.0)
Calcium, Ion: 1.22 mmol/L (ref 1.15–1.40)
HCT: 45 % (ref 39.0–52.0)
Hemoglobin: 15.3 g/dL (ref 13.0–17.0)
O2 Saturation: 71 %
Potassium: 4.1 mmol/L (ref 3.5–5.1)
Sodium: 138 mmol/L (ref 135–145)
TCO2: 26 mmol/L (ref 22–32)
pCO2, Ven: 44.2 mmHg (ref 44–60)
pH, Ven: 7.36 (ref 7.25–7.43)
pO2, Ven: 39 mmHg (ref 32–45)

## 2023-06-22 LAB — SURGICAL PCR SCREEN
MRSA, PCR: NEGATIVE
Staphylococcus aureus: NEGATIVE

## 2023-06-22 LAB — HEPARIN LEVEL (UNFRACTIONATED): Heparin Unfractionated: 0.34 IU/mL (ref 0.30–0.70)

## 2023-06-22 LAB — APTT: aPTT: 54 seconds — ABNORMAL HIGH (ref 24–36)

## 2023-06-22 SURGERY — RIGHT/LEFT HEART CATH AND CORONARY ANGIOGRAPHY
Anesthesia: Moderate Sedation

## 2023-06-22 SURGERY — LEFT HEART CATH AND CORONARY ANGIOGRAPHY
Anesthesia: Moderate Sedation | Laterality: Left

## 2023-06-22 MED ORDER — SPIRONOLACTONE 25 MG PO TABS
25.0000 mg | ORAL_TABLET | Freq: Every day | ORAL | Status: DC
  Administered 2023-06-22: 25 mg via ORAL
  Filled 2023-06-22: qty 1

## 2023-06-22 MED ORDER — APIXABAN 5 MG PO TABS
5.0000 mg | ORAL_TABLET | Freq: Two times a day (BID) | ORAL | 0 refills | Status: DC
  Filled 2023-06-22 – 2023-07-25 (×2): qty 60, 30d supply, fill #0

## 2023-06-22 MED ORDER — ASPIRIN 81 MG PO CHEW: 81.0000 mg | CHEWABLE_TABLET | ORAL | Status: DC

## 2023-06-22 MED ORDER — SODIUM CHLORIDE 0.9 % IV SOLN: 250.0000 mL | INTRAVENOUS | Status: DC | PRN

## 2023-06-22 MED ORDER — ONDANSETRON HCL 4 MG/2ML IJ SOLN: 4.0000 mg | Freq: Four times a day (QID) | INTRAMUSCULAR | Status: DC | PRN

## 2023-06-22 MED ORDER — EZETIMIBE 10 MG PO TABS
10.0000 mg | ORAL_TABLET | Freq: Every day | ORAL | Status: DC
  Filled 2023-06-22: qty 1

## 2023-06-22 MED ORDER — HEPARIN 30,000 UNITS/1000 ML (OHS) CELLSAVER SOLUTION
Status: DC
  Filled 2023-06-22: qty 1000

## 2023-06-22 MED ORDER — HEPARIN (PORCINE) 25000 UT/250ML-% IV SOLN
1650.0000 [IU]/h | INTRAVENOUS | Status: DC
  Administered 2023-06-22: 1400 [IU]/h via INTRAVENOUS
  Administered 2023-06-23: 1600 [IU]/h via INTRAVENOUS
  Administered 2023-06-23: 1400 [IU]/h via INTRAVENOUS
  Administered 2023-06-24 – 2023-06-25 (×2): 1650 [IU]/h via INTRAVENOUS
  Filled 2023-06-22 (×4): qty 250

## 2023-06-22 MED ORDER — PLASMA-LYTE A IV SOLN
INTRAVENOUS | Status: DC
  Filled 2023-06-22: qty 2.5

## 2023-06-22 MED ORDER — ASPIRIN 81 MG PO CHEW
81.0000 mg | CHEWABLE_TABLET | Freq: Every day | ORAL | Status: DC
  Filled 2023-06-22: qty 1

## 2023-06-22 MED ORDER — HYDRALAZINE HCL 20 MG/ML IJ SOLN: 10.0000 mg | INTRAMUSCULAR | Status: DC | PRN

## 2023-06-22 MED ORDER — PHENYLEPHRINE HCL-NACL 20-0.9 MG/250ML-% IV SOLN
30.0000 ug/min | INTRAVENOUS | Status: AC
  Administered 2023-06-25: 10 ug/min via INTRAVENOUS
  Filled 2023-06-22: qty 250

## 2023-06-22 MED ORDER — IVABRADINE HCL 5 MG PO TABS
5.0000 mg | ORAL_TABLET | Freq: Two times a day (BID) | ORAL | Status: DC
  Administered 2023-06-22: 5 mg via ORAL
  Filled 2023-06-22: qty 1

## 2023-06-22 MED ORDER — SODIUM CHLORIDE 0.9 % WEIGHT BASED INFUSION
3.0000 mL/kg/h | INTRAVENOUS | Status: DC
  Administered 2023-06-22: 3 mL/kg/h via INTRAVENOUS

## 2023-06-22 MED ORDER — ALPRAZOLAM 0.5 MG PO TABS
0.5000 mg | ORAL_TABLET | Freq: Every evening | ORAL | Status: DC | PRN
  Administered 2023-06-22: 0.5 mg via ORAL
  Filled 2023-06-22: qty 1

## 2023-06-22 MED ORDER — MILRINONE LACTATE IN DEXTROSE 20-5 MG/100ML-% IV SOLN
0.3000 ug/kg/min | INTRAVENOUS | Status: DC
  Filled 2023-06-22: qty 100

## 2023-06-22 MED ORDER — PANTOPRAZOLE SODIUM 40 MG PO TBEC
40.0000 mg | DELAYED_RELEASE_TABLET | Freq: Two times a day (BID) | ORAL | Status: DC
  Administered 2023-06-22 – 2023-06-24 (×5): 40 mg via ORAL
  Filled 2023-06-22 (×5): qty 1

## 2023-06-22 MED ORDER — HEPARIN SODIUM (PORCINE) 1000 UNIT/ML IJ SOLN
INTRAMUSCULAR | Status: AC
  Filled 2023-06-22: qty 10

## 2023-06-22 MED ORDER — HEPARIN (PORCINE) 25000 UT/250ML-% IV SOLN
INTRAVENOUS | Status: AC
  Filled 2023-06-22: qty 250

## 2023-06-22 MED ORDER — HEPARIN (PORCINE) IN NACL 2000-0.9 UNIT/L-% IV SOLN
INTRAVENOUS | Status: DC | PRN
  Administered 2023-06-22: 1000 mL

## 2023-06-22 MED ORDER — VERAPAMIL HCL 2.5 MG/ML IV SOLN
INTRAVENOUS | Status: AC
  Filled 2023-06-22: qty 2

## 2023-06-22 MED ORDER — TRANEXAMIC ACID (OHS) PUMP PRIME SOLUTION
2.0000 mg/kg | INTRAVENOUS | Status: DC
  Filled 2023-06-22: qty 1.95

## 2023-06-22 MED ORDER — NITROGLYCERIN 0.4 MG SL SUBL: 0.4000 mg | SUBLINGUAL_TABLET | SUBLINGUAL | Status: DC | PRN

## 2023-06-22 MED ORDER — CARVEDILOL 3.125 MG PO TABS
3.1250 mg | ORAL_TABLET | Freq: Two times a day (BID) | ORAL | Status: DC
  Filled 2023-06-22 (×4): qty 1

## 2023-06-22 MED ORDER — ASPIRIN 81 MG PO TBEC
81.0000 mg | DELAYED_RELEASE_TABLET | Freq: Every day | ORAL | Status: DC
  Administered 2023-06-23 – 2023-06-24 (×2): 81 mg via ORAL
  Filled 2023-06-22 (×2): qty 1

## 2023-06-22 MED ORDER — SODIUM CHLORIDE 0.9% FLUSH: 3.0000 mL | INTRAVENOUS | Status: DC | PRN

## 2023-06-22 MED ORDER — HEPARIN SODIUM (PORCINE) 1000 UNIT/ML IJ SOLN
INTRAMUSCULAR | Status: DC | PRN
  Administered 2023-06-22: 5000 [IU] via INTRAVENOUS

## 2023-06-22 MED ORDER — NOREPINEPHRINE 4 MG/250ML-% IV SOLN
0.0000 ug/min | INTRAVENOUS | Status: DC
  Filled 2023-06-22: qty 250

## 2023-06-22 MED ORDER — CEFAZOLIN SODIUM-DEXTROSE 2-4 GM/100ML-% IV SOLN
2.0000 g | INTRAVENOUS | Status: AC
  Administered 2023-06-25: 2 g via INTRAVENOUS
  Filled 2023-06-22: qty 100

## 2023-06-22 MED ORDER — MIDAZOLAM HCL 2 MG/2ML IJ SOLN
INTRAMUSCULAR | Status: DC | PRN
  Administered 2023-06-22: .5 mg via INTRAVENOUS

## 2023-06-22 MED ORDER — VANCOMYCIN HCL 1500 MG/300ML IV SOLN
1500.0000 mg | INTRAVENOUS | Status: AC
  Administered 2023-06-25: 1500 mg via INTRAVENOUS
  Filled 2023-06-22: qty 300

## 2023-06-22 MED ORDER — LIDOCAINE HCL (PF) 1 % IJ SOLN
INTRAMUSCULAR | Status: DC | PRN
  Administered 2023-06-22: 5 mL via SUBCUTANEOUS
  Administered 2023-06-22: 2 mL via SUBCUTANEOUS

## 2023-06-22 MED ORDER — FENTANYL CITRATE (PF) 100 MCG/2ML IJ SOLN
INTRAMUSCULAR | Status: DC | PRN
  Administered 2023-06-22: 12.5 ug via INTRAVENOUS

## 2023-06-22 MED ORDER — EZETIMIBE 10 MG PO TABS
10.0000 mg | ORAL_TABLET | Freq: Every day | ORAL | Status: DC
  Filled 2023-06-22 (×3): qty 1

## 2023-06-22 MED ORDER — ASPIRIN 81 MG PO CHEW: 81.0000 mg | CHEWABLE_TABLET | Freq: Every day | ORAL | Status: DC

## 2023-06-22 MED ORDER — SODIUM CHLORIDE 0.9 % WEIGHT BASED INFUSION: 1.0000 mL/kg/h | INTRAVENOUS | Status: DC

## 2023-06-22 MED ORDER — ATORVASTATIN CALCIUM 10 MG PO TABS
20.0000 mg | ORAL_TABLET | Freq: Every day | ORAL | Status: DC
  Administered 2023-06-23 – 2023-06-24 (×2): 20 mg via ORAL
  Filled 2023-06-22 (×2): qty 2

## 2023-06-22 MED ORDER — MIDAZOLAM HCL 2 MG/2ML IJ SOLN
INTRAMUSCULAR | Status: AC
  Filled 2023-06-22: qty 2

## 2023-06-22 MED ORDER — LEVOTHYROXINE SODIUM 25 MCG PO TABS
137.0000 ug | ORAL_TABLET | Freq: Every day | ORAL | Status: DC
  Administered 2023-06-23 – 2023-06-25 (×3): 137 ug via ORAL
  Filled 2023-06-22 (×3): qty 1

## 2023-06-22 MED ORDER — LEVOTHYROXINE SODIUM 137 MCG PO TABS
137.0000 ug | ORAL_TABLET | Freq: Every day | ORAL | Status: DC
  Filled 2023-06-22 (×2): qty 1

## 2023-06-22 MED ORDER — PERFLUTREN LIPID MICROSPHERE
1.0000 mL | INTRAVENOUS | Status: DC | PRN
  Administered 2023-06-22: 2 mL via INTRAVENOUS

## 2023-06-22 MED ORDER — IVABRADINE HCL 5 MG PO TABS
5.0000 mg | ORAL_TABLET | Freq: Two times a day (BID) | ORAL | Status: DC
  Administered 2023-06-23 – 2023-06-24 (×4): 5 mg via ORAL
  Filled 2023-06-22 (×5): qty 1

## 2023-06-22 MED ORDER — SODIUM CHLORIDE 0.9% FLUSH
3.0000 mL | Freq: Two times a day (BID) | INTRAVENOUS | Status: DC
  Administered 2023-06-22 – 2023-06-24 (×4): 3 mL via INTRAVENOUS

## 2023-06-22 MED ORDER — NITROGLYCERIN IN D5W 200-5 MCG/ML-% IV SOLN
2.0000 ug/min | INTRAVENOUS | Status: DC
  Filled 2023-06-22: qty 250

## 2023-06-22 MED ORDER — ATORVASTATIN CALCIUM 20 MG PO TABS
20.0000 mg | ORAL_TABLET | Freq: Every day | ORAL | Status: DC
  Filled 2023-06-22: qty 1

## 2023-06-22 MED ORDER — INSULIN REGULAR(HUMAN) IN NACL 100-0.9 UT/100ML-% IV SOLN
INTRAVENOUS | Status: AC
  Administered 2023-06-25: 1.8 [IU]/h via INTRAVENOUS
  Filled 2023-06-22: qty 100

## 2023-06-22 MED ORDER — TRANEXAMIC ACID 1000 MG/10ML IV SOLN
1.5000 mg/kg/h | INTRAVENOUS | Status: AC
  Administered 2023-06-25: 1.5 mg/kg/h via INTRAVENOUS
  Filled 2023-06-22: qty 25

## 2023-06-22 MED ORDER — SPIRONOLACTONE 25 MG PO TABS
25.0000 mg | ORAL_TABLET | Freq: Every day | ORAL | Status: DC
  Administered 2023-06-23 – 2023-06-24 (×2): 25 mg via ORAL
  Filled 2023-06-22 (×2): qty 1

## 2023-06-22 MED ORDER — CARVEDILOL 3.125 MG PO TABS
3.1250 mg | ORAL_TABLET | Freq: Two times a day (BID) | ORAL | Status: DC
  Administered 2023-06-22 – 2023-06-24 (×5): 3.125 mg via ORAL
  Filled 2023-06-22 (×5): qty 1

## 2023-06-22 MED ORDER — EZETIMIBE 10 MG PO TABS
10.0000 mg | ORAL_TABLET | Freq: Every day | ORAL | Status: DC
  Administered 2023-06-23 – 2023-06-24 (×2): 10 mg via ORAL
  Filled 2023-06-22 (×2): qty 1

## 2023-06-22 MED ORDER — EPINEPHRINE HCL 5 MG/250ML IV SOLN IN NS
0.0000 ug/min | INTRAVENOUS | Status: DC
  Filled 2023-06-22: qty 250

## 2023-06-22 MED ORDER — HEPARIN (PORCINE) IN NACL 1000-0.9 UT/500ML-% IV SOLN
INTRAVENOUS | Status: AC
  Filled 2023-06-22: qty 1000

## 2023-06-22 MED ORDER — VERAPAMIL HCL 2.5 MG/ML IV SOLN
INTRAVENOUS | Status: DC | PRN
  Administered 2023-06-22 (×2): 2.5 mg via INTRAVENOUS

## 2023-06-22 MED ORDER — SACUBITRIL-VALSARTAN 24-26 MG PO TABS
1.0000 | ORAL_TABLET | Freq: Two times a day (BID) | ORAL | Status: DC
  Administered 2023-06-22 – 2023-06-23 (×3): 1 via ORAL
  Filled 2023-06-22 (×3): qty 1

## 2023-06-22 MED ORDER — DAPAGLIFLOZIN PROPANEDIOL 10 MG PO TABS
10.0000 mg | ORAL_TABLET | Freq: Every day | ORAL | Status: DC
  Administered 2023-06-23 – 2023-06-24 (×2): 10 mg via ORAL
  Filled 2023-06-22 (×2): qty 1

## 2023-06-22 MED ORDER — SODIUM CHLORIDE 0.9% FLUSH: 3.0000 mL | Freq: Two times a day (BID) | INTRAVENOUS | Status: DC

## 2023-06-22 MED ORDER — HEPARIN (PORCINE) 25000 UT/250ML-% IV SOLN
1400.0000 [IU]/h | INTRAVENOUS | Status: DC
  Administered 2023-06-22: 1400 [IU]/h via INTRAVENOUS

## 2023-06-22 MED ORDER — LEVOTHYROXINE SODIUM 137 MCG PO TABS
137.0000 ug | ORAL_TABLET | Freq: Every day | ORAL | Status: DC
  Filled 2023-06-22: qty 1

## 2023-06-22 MED ORDER — POTASSIUM CHLORIDE 2 MEQ/ML IV SOLN
80.0000 meq | INTRAVENOUS | Status: DC
  Filled 2023-06-22: qty 40

## 2023-06-22 MED ORDER — ACETAMINOPHEN 325 MG PO TABS: 650.0000 mg | ORAL_TABLET | ORAL | Status: DC | PRN

## 2023-06-22 MED ORDER — DEXMEDETOMIDINE HCL IN NACL 400 MCG/100ML IV SOLN
0.1000 ug/kg/h | INTRAVENOUS | Status: AC
  Administered 2023-06-25: .7 ug/kg/h via INTRAVENOUS
  Filled 2023-06-22: qty 100

## 2023-06-22 MED ORDER — DAPAGLIFLOZIN PROPANEDIOL 10 MG PO TABS
10.0000 mg | ORAL_TABLET | Freq: Every day | ORAL | Status: DC
  Filled 2023-06-22: qty 1

## 2023-06-22 MED ORDER — TRANEXAMIC ACID (OHS) BOLUS VIA INFUSION
15.0000 mg/kg | INTRAVENOUS | Status: AC
  Administered 2023-06-25: 1462.5 mg via INTRAVENOUS
  Filled 2023-06-22: qty 1463

## 2023-06-22 MED ORDER — HEPARIN BOLUS VIA INFUSION
5800.0000 [IU] | Freq: Once | INTRAVENOUS | Status: AC
  Administered 2023-06-22: 5800 [IU] via INTRAVENOUS
  Filled 2023-06-22: qty 5800

## 2023-06-22 MED ORDER — LIDOCAINE HCL 1 % IJ SOLN
INTRAMUSCULAR | Status: AC
  Filled 2023-06-22: qty 20

## 2023-06-22 MED ORDER — MAGNESIUM SULFATE 50 % IJ SOLN
40.0000 meq | INTRAMUSCULAR | Status: DC
  Filled 2023-06-22: qty 9.85

## 2023-06-22 MED ORDER — ACETAMINOPHEN 325 MG PO TABS
650.0000 mg | ORAL_TABLET | ORAL | Status: DC | PRN
  Administered 2023-06-23 – 2023-06-24 (×3): 650 mg via ORAL
  Filled 2023-06-22 (×3): qty 2

## 2023-06-22 MED ORDER — SACUBITRIL-VALSARTAN 24-26 MG PO TABS
1.0000 | ORAL_TABLET | Freq: Two times a day (BID) | ORAL | Status: DC
  Administered 2023-06-22: 1 via ORAL
  Filled 2023-06-22: qty 1

## 2023-06-22 MED ORDER — FENTANYL CITRATE (PF) 100 MCG/2ML IJ SOLN
INTRAMUSCULAR | Status: AC
  Filled 2023-06-22: qty 2

## 2023-06-22 MED ORDER — IOHEXOL 350 MG/ML SOLN
INTRAVENOUS | Status: DC | PRN
  Administered 2023-06-22: 68 mL

## 2023-06-22 MED ORDER — PANTOPRAZOLE SODIUM 40 MG PO TBEC
40.0000 mg | DELAYED_RELEASE_TABLET | Freq: Two times a day (BID) | ORAL | Status: DC
  Administered 2023-06-22: 40 mg via ORAL
  Filled 2023-06-22: qty 1

## 2023-06-22 MED ORDER — ATORVASTATIN CALCIUM 20 MG PO TABS
20.0000 mg | ORAL_TABLET | Freq: Every day | ORAL | Status: DC
  Filled 2023-06-22 (×3): qty 1

## 2023-06-22 SURGICAL SUPPLY — 13 items
CATH 5FR JL3.5 JR4 ANG PIG MP (CATHETERS) IMPLANT
CATH BALLN WEDGE 5F 110CM (CATHETERS) IMPLANT
DEVICE RAD TR BAND REGULAR (VASCULAR PRODUCTS) IMPLANT
DRAPE BRACHIAL (DRAPES) IMPLANT
GLIDESHEATH SLEND SS 6F .021 (SHEATH) IMPLANT
GUIDEWIRE .025 260CM (WIRE) IMPLANT
GUIDEWIRE INQWIRE 1.5J.035X260 (WIRE) IMPLANT
INQWIRE 1.5J .035X260CM (WIRE) ×1
PACK CARDIAC CATH (CUSTOM PROCEDURE TRAY) ×1 IMPLANT
PROTECTION STATION PRESSURIZED (MISCELLANEOUS) ×1
SET ATX-X65L (MISCELLANEOUS) IMPLANT
SHEATH GLIDE SLENDER 4/5FR (SHEATH) IMPLANT
STATION PROTECTION PRESSURIZED (MISCELLANEOUS) IMPLANT

## 2023-06-22 NOTE — Consult Note (Addendum)
    [address]       [place] [address]             [phone]        Bobby [name] [facility] Medical Record #[id number] Date of Birth: 09/06/1971  Referring: [name], Christopher, MD Primary Care: [name], Tanya, MD Primary Cardiologist:Bobby Gollan, MD  Chief Complaint:   No chief complaint on file.   History of Present Illness:     [name] is a 51 year old male with a past medical history of HTN, HLD, newly diagnosed paroxysmal atrial fibrillation, CAD (MI s/p 2 DES to the RCA in 2003 and anterior STEMI s/p DES to the LAD in 2018), HFrEF, ischemic cardiomyopathy and hypothyroidism.  In 2018 after emergent catheterization and revascularization he required Impella support for approximately 36-48 hours due to cardiogenic shock. In August 2021 he had complaints of occasional chest discomfort, fatigue and dyspnea, he underwent cardiac catheterization which showed a patent ostial LAD stent, 20% ostial diagonal stenosis, 20% left circumflex OM1 stenosis and proximal in-stent CTO with collaterals to the RCA, medical management was recommended at that time. Echo in 2021 showed an EF of 40-45%.  On 06/15/23 he presented to the ED with a complaint of chest pain, palpitations and dyspnea with exertion. His chest pain radiated into his throat and jaw. He states it felt like his heart attack in the past. He denied dizziness, nausea, vomiting or LOC. He took Nitroglycerin and Aspirin which improved his pain. He admits to chest discomfort with exertion for the last couple of weeks. While in the ED his Troponin I (high sensitivity) peaked at 97. His EKG showed no sign of acute ischemia but he was found to be in atrial fibrillation with RVR. He converted to normal sinus rhythm with IV Diltiazem and was started on Eliquis and discharged home. On 06/22/23 he underwent cardiac catheterization which showed significant two vessel CAD including 90% ostial Lcx stenosis (ostium jailed by pLAD stent  extending back to the distal LMCA) and chronic total occlusion of the proximal RCA stent with a widely patent ostial/proximal LAD stent as well as upper normal to mildly elevated left and right heart filling pressures. His echocardiogram on 06/22/23 showed LVEF 25-30% with mild left ventricular hypertrophy, grade I diastolic dysfunction and akinesis of the left ventricular, anterior wall and apical segment.  The patient has a family history of heart disease in both parents and his father underwent bypass surgery in his 40s. He is a former smoker who states he quit in 2018 but still has a couple cigarettes per month. 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 He lives with wife and has two flights of stairs to get into his apartment. He is right hand dominant.    Current Activity/ Functional Status: Patient is independent with mobility/ambulation, transfers, ADL's, IADL's.   Zubrod Score: At the time of surgery this patient's most appropriate activity status/level should be described as: []    0    Normal activity, no symptoms [x]    1    Restricted in physical strenuous activity but ambulatory, able to do out light work []    2    Ambulatory and capable of self care, unable to do work activities, up and about                 more than 50%  Of the time                            []      3    Only limited self care, in bed greater than 50% of waking hours []    4    Completely disabled, no self care, confined to bed or chair []    5    Moribund  Past Medical History:  Diagnosis Date   Acute MI anterior wall first episode care (HCC) 07/13/2017   Occluded ostial LAD S/P 3.5x26 Onyx DES. RI 80%. RCA Occluded at prior stent site with collaterals    Cardiogenic shock (HCC) 07/13/2017   CHF (congestive heart failure) (HCC)    Graves disease    Hypertension    MI (myocardial infarction) (HCC)    Thyroid disease    thyroid irradiated per pt    Past Surgical History:  Procedure Laterality Date   CORONARY  ANGIOPLASTY WITH STENT PLACEMENT     CORONARY/GRAFT ACUTE MI REVASCULARIZATION N/A 07/13/2017   Procedure: Coronary/Graft Acute MI Revascularization;  Surgeon: Ganji, Jay, MD;  Location: [facility];  Service: Cardiovascular;  Laterality: N/A;   ESOPHAGOGASTRODUODENOSCOPY N/A 06/30/2020   Procedure: ESOPHAGOGASTRODUODENOSCOPY (EGD);  Surgeon: Toledo, Teodoro K, MD;  Location: [facility];  Service: Gastroenterology;  Laterality: N/A;   LEFT HEART CATH AND CORONARY ANGIOGRAPHY N/A 07/13/2017   Procedure: Left Heart Cath and Coronary Angiography;  Surgeon: Ganji, Jay, MD;  Location: [facility];  Service: Cardiovascular;  Laterality: N/A;   LEFT HEART CATH AND CORONARY ANGIOGRAPHY N/A 08/21/2017   Procedure: LEFT HEART CATH AND CORONARY ANGIOGRAPHY;  Surgeon: Patwardhan, Manish J, MD;  Location: [facility];  Service: Cardiovascular;  Laterality: N/A;   LEFT HEART CATH AND CORONARY ANGIOGRAPHY N/A 08/10/2020   Procedure: LEFT HEART CATH AND CORONARY ANGIOGRAPHY;  Surgeon: Patwardhan, Manish J, MD;  Location: [facility];  Service: Cardiovascular;  Laterality: N/A;   SKIN GRAFT     VENTRICULAR ASSIST DEVICE INSERTION N/A 07/13/2017   Procedure: Ventricular Assist Device Insertion;  Surgeon: Ganji, Jay, MD;  Location: [facility];  Service: Cardiovascular;  Laterality: N/A;    Social History   Tobacco Use  Smoking Status Former   Packs/day: 0.50   Years: 20.00   Additional pack years: 0.00   Total pack years: 10.00   Types: Cigarettes   Quit date: 07/13/2017   Years since quitting: 5.9  Smokeless Tobacco Never  Tobacco Comments   quit 2012    Social History   Substance and Sexual Activity  Alcohol Use Yes   Comment: occ     No Known Allergies  No current facility-administered medications for this encounter.    Medications Prior to Admission  Medication Sig Dispense Refill Last Dose   ALPRAZolam (XANAX) 0.5 MG tablet Take 1 tablet (0.5 mg total)  by mouth at bedtime as needed. 15 tablet 0    apixaban (ELIQUIS) 5 MG TABS tablet Take 1 tablet (5 mg total) by mouth 2 (two) times daily. Await additional instructions once at [place] 60 tablet 0    aspirin 81 MG chewable tablet Chew 1 tablet (81 mg total) by mouth daily. 30 tablet 11    atorvastatin (LIPITOR) 20 MG tablet Take 1 tablet (20 mg total) by mouth daily. 90 tablet 0    carvedilol (COREG) 3.125 MG tablet Take 1 tablet (3.125 mg total) by mouth 2 (two) times daily. 180 tablet 3    dapagliflozin propanediol (FARXIGA) 10 MG TABS tablet Take 1 tablet (10 mg total) by mouth daily before breakfast. 90 tablet 2    Evolocumab (  REPATHA SURECLICK) 140 MG/ML SOAJ inject 140mg every 14 days 6 mL 4    ezetimibe (ZETIA) 10 MG tablet Take 1 tablet (10 mg total) by mouth daily. 90 tablet 0    ivabradine (CORLANOR) 5 MG TABS tablet Take 1 tablet (5 mg total) by mouth 2 (two) times daily with a meal. 180 tablet 0    levothyroxine (SYNTHROID) 137 MCG tablet Take 1 tablet (137 mcg total) by mouth daily. 30 tablet 0    nitroGLYCERIN (NITROSTAT) 0.4 MG SL tablet PLACE 1 TABLET UNDER THE TONGUE EVERY 5 MINUTES AS NEEDED FOR CHEST PAIN. 25 tablet 2    pantoprazole (PROTONIX) 40 MG tablet Take 1 tablet (40 mg total) by mouth 2 (two) times daily. 60 tablet 3    sacubitril-valsartan (ENTRESTO) 24-26 MG Take 1 tablet by mouth 2 (two) times daily. 180 tablet 0    sildenafil (VIAGRA) 100 MG tablet Take 1 tablet (100 mg total) by mouth as needed for erectile dysfunction. TAKE 1 (ONE) TABLET AS NEEDED. DO NOT USE AT SAME TIME AS NITRO 30 tablet 3    spironolactone (ALDACTONE) 25 MG tablet Take 1 tablet (25 mg total) by mouth daily. 90 tablet 0     Family History  Problem Relation Age of Onset   Valvular heart disease Mother    Heart disease Father 49       CABG   Hypertension Father    Heart disease Paternal Grandmother    Review of Systems:  Review of Systems  Constitutional:  Positive for  malaise/fatigue. Negative for chills, diaphoresis and fever.  HENT:  Positive for tinnitus. Negative for hearing loss.   Eyes:  Positive for blurred vision. Negative for double vision.  Respiratory:  Positive for cough, sputum production and shortness of breath. Negative for wheezing.   Cardiovascular:  Positive for chest pain, palpitations and orthopnea. Negative for claudication and leg swelling.  Gastrointestinal:  Positive for heartburn. Negative for nausea and vomiting.  Genitourinary:  Negative for dysuria.  Musculoskeletal:  Positive for myalgias.       No varicose veins  Neurological:  Negative for dizziness, loss of consciousness and weakness.  Endo/Heme/Allergies:  Does not bruise/bleed easily.  Psychiatric/Behavioral:  Positive for depression. Negative for suicidal ideas. The patient is nervous/anxious.   Dental visit last month   Physical Exam: Today's Vitals   06/22/23 1505  BP: 102/78  Pulse: (!) 59  Resp: 20  Temp: 98.3 F (36.8 C)  TempSrc: Oral  SpO2: 97%  Weight: 97.5 kg  Height: 6' 1" (1.854 m)  PainSc: 0-No pain   Body mass index is 28.37 kg/m.  General appearance: alert, cooperative, and no distress Neck: no adenopathy, no carotid bruit, no JVD, supple, symmetrical, trachea midline, and thyroid not enlarged, symmetric, no tenderness/mass/nodules Lymph nodes: Cervical, supraclavicular, and axillary nodes normal. Resp: clear to auscultation bilaterally Cardio: regular rate and rhythm, S1, S2 normal, no murmur, click, rub or gallop Extremities: extremities normal, atraumatic, no cyanosis or edema, 2+ DP/PT pulses bilaterally Neurologic: Grossly normal  Diagnostic Studies & Recent Radiology Findings:     ABDOMINAL AORTOGRAM  RIGHT/LEFT HEART CATH AND CORONARY ANGIOGRAPHY   Conclusions: Significant two-vessel coronary artery disease, including 90% ostial LCx stenosis (ostium appears jailed by proximal LAD stent extending back into the distal LMCA) and  chronic total occlusion of proximal RCA stent. Widely patent ostial/proximal LAD stent. Severely reduced left ventricular systolic function (LVEF 25-35%). Upper normal to mildly elevated left and right heart filling pressures (LVEDP 16   mmHg, PCWP 16 mm Hg, mean RA 7). Low normal to mildly reduced cardiac output/index (Fick CO 5.1 L/min, CI 2.3 L/min/m^2). Angiographically normal abdominal aorta, iliac arteries, and right common femoral artery.   Left Main  Vessel is large.    Left Anterior Descending  Vessel is large.  Non-stenotic Ost LAD to Prox LAD lesion was previously treated.    First Diagonal Branch  Vessel is moderate in size.  Ost 1st Diag lesion is 25% stenosed.    Ramus Intermedius  Ost Ramus lesion is 50% stenosed.    Left Circumflex  Vessel is large.  Ost Cx lesion is 90% stenosed. Vessel is the culprit lesion.    First Obtuse Marginal Branch  Vessel is large in size.  1st Mrg lesion is 25% stenosed.    Second Obtuse Marginal Branch  Vessel is small in size.    Third Obtuse Marginal Branch  Vessel is moderate in size.    Right Coronary Artery  Collaterals  Mid RCA filled by collaterals from Dist LAD.    Collaterals  Mid RCA filled by collaterals from Dist Cx.    Prox RCA lesion is 100% stenosed. The lesion was previously treated . Known CTO. RCA not engaged today     Dominance: Right  ECHOCARDIOGRAM REPORT   Patient Name:   Bobby [name] Date of Exam: 06/22/2023  Medical Rec #:  [id number]   Height:       73.0 in  Accession #:    [id number]  Weight:       215.0 lb  Date of Birth:  05/10/1971  BSA:          2.219 m  Patient Age:    51 years    BP:           100/79 mmHg  Patient Gender: M           HR:           63 bpm.  Exam Location:  [facility]   Procedure: 2D Echo, Cardiac Doppler, Color Doppler and Intracardiac             Opacification Agent   Indications:     Acute ischemic heart disease, unspecified I24.9    History:         Patient has prior  history of Echocardiogram examinations,  most                  recent 07/14/2017. CHF; Previous Myocardial Infarction.    Sonographer:     Jerry Hege  Referring Phys:  [id number] Bobby [name]  Diagnosing Phys: Bobby End MD     Sonographer Comments: Suboptimal parasternal window and suboptimal apical  window.  IMPRESSIONS   1. Left ventricular ejection fraction, by estimation, is 25 to 30%. The  left ventricle has severely decreased function. The left ventricle  demonstrates regional wall motion abnormalities (see scoring  diagram/findings for description). There is mild left   ventricular hypertrophy. Left ventricular diastolic parameters are  consistent with Grade I diastolic dysfunction (impaired relaxation). There  is akinesis of the left ventricular, entire anterior wall and apical  segment.   2. Right ventricular systolic function is mildly reduced. The right  ventricular size is mildly enlarged. Tricuspid regurgitation signal is  inadequate for assessing PA pressure.   3. The mitral valve was not well visualized. No evidence of mitral valve  regurgitation. No evidence of mitral stenosis.   4. The aortic valve was not well visualized. Aortic valve regurgitation    is not visualized. No aortic stenosis is present.   FINDINGS   Left Ventricle: Left ventricular ejection fraction, by estimation, is 25  to 30%. The left ventricle has severely decreased function. The left  ventricle demonstrates regional wall motion abnormalities. Definity  contrast agent was given IV to delineate  the left ventricular endocardial borders. The left ventricular internal  cavity size was normal in size. There is mild left ventricular  hypertrophy. Left ventricular diastolic parameters are consistent with  Grade I diastolic dysfunction (impaired  relaxation).   Right Ventricle: The right ventricular size is mildly enlarged. No  increase in right ventricular wall thickness. Right ventricular  systolic  function is mildly reduced. Tricuspid regurgitation signal is inadequate  for assessing PA pressure.   Left Atrium: Left atrial size was normal in size.   Right Atrium: Right atrial size was normal in size.   Pericardium: There is no evidence of pericardial effusion.   Mitral Valve: The mitral valve was not well visualized. No evidence of  mitral valve regurgitation. No evidence of mitral valve stenosis. MV peak  gradient, 2.4 mmHg. The mean mitral valve gradient is 1.0 mmHg.   Tricuspid Valve: The tricuspid valve is not well visualized. Tricuspid  valve regurgitation is trivial.   Aortic Valve: The aortic valve was not well visualized. Aortic valve  regurgitation is not visualized. No aortic stenosis is present. Aortic  valve mean gradient measures 2.0 mmHg. Aortic valve peak gradient measures  3.4 mmHg. Aortic valve area, by VTI  measures 2.96 cm.   Pulmonic Valve: The pulmonic valve was not well visualized. Pulmonic valve  regurgitation is not visualized. No evidence of pulmonic stenosis.   Aorta: The aortic root is normal in size and structure.   Pulmonary Artery: The pulmonary artery is not well seen.   Venous: The inferior vena cava was not well visualized.   IAS/Shunts: The interatrial septum was not well visualized.     LEFT VENTRICLE  PLAX 2D  LVIDd:         4.70 cm      Diastology  LVIDs:         4.00 cm      LV e' medial:    5.44 cm/s  LV PW:         1.10 cm      LV E/e' medial:  11.9  LV IVS:        1.10 cm      LV e' lateral:   7.51 cm/s  LVOT diam:     2.00 cm      LV E/e' lateral: 8.7  LV SV:         47  LV SV Index:   21  LVOT Area:     3.14 cm    LV Volumes (MOD)  LV vol d, MOD A2C: 86.1 ml  LV vol d, MOD A4C: 150.0 ml  LV vol s, MOD A2C: 74.1 ml  LV vol s, MOD A4C: 106.0 ml  LV SV MOD A2C:     12.0 ml  LV SV MOD A4C:     150.0 ml  LV SV MOD BP:      22.5 ml   RIGHT VENTRICLE  RV Basal diam:  4.90 cm  RV Mid diam:    3.60 cm  RV S  prime:     10.80 cm/s  TAPSE (M-mode): 1.4 cm   LEFT ATRIUM           Index          RIGHT ATRIUM           Index  LA diam:      3.30 cm 1.49 cm/m   RA Area:     17.00 cm  LA Vol (A2C): 53.4 ml 24.07 ml/m  RA Volume:   49.00 ml  22.08 ml/m  LA Vol (A4C): 25.2 ml 11.36 ml/m   AORTIC VALVE  AV Area (Vmax):    2.63 cm  AV Area (Vmean):   2.72 cm  AV Area (VTI):     2.96 cm  AV Vmax:           91.60 cm/s  AV Vmean:          60.300 cm/s  AV VTI:            0.160 m  AV Peak Grad:      3.4 mmHg  AV Mean Grad:      2.0 mmHg  LVOT Vmax:         76.60 cm/s  LVOT Vmean:        52.300 cm/s  LVOT VTI:          0.151 m  LVOT/AV VTI ratio: 0.94    AORTA  Ao Root diam: 2.60 cm   MITRAL VALVE  MV Area (PHT): 3.31 cm    SHUNTS  MV Area VTI:   2.08 cm    Systemic VTI:  0.15 m  MV Peak grad:  2.4 mmHg    Systemic Diam: 2.00 cm  MV Mean grad:  1.0 mmHg  MV Vmax:       0.78 m/s  MV Vmean:      43.1 cm/s  MV Decel Time: 229 msec  MV E velocity: 65.00 cm/s  MV A velocity: 79.10 cm/s  MV E/A ratio:  0.82   Bobby End MD  Electronically signed by Bobby End MD  Signature Date/Time: 06/22/2023/1:05:21 PM      Final     Assessment/Plan: CAD: Significant 2 vessel disease including in-stent CTO of the RCA and 90% stenosis of the ostial Lcx. His last Eliquis dose was on Monday. He is currently on IV Heparin. Pt would benefit from CABG and seems to be a reasonable candidate, but will be higher risk due to low LVEF. Possible radial artery harvest. Of note, the patient would prefer CABG surgery over further PCI. Dr. Alvy Alsop to determine candidacy and timing, next OR availabilities are 07/01 or 07/03. HTN: On Coreg and Entresto HLD: On Repatha, Lipitor, and Zetia PAF: In NSR. On IV Heparin, last Eliquis dose on Monday Ischemic cardiomyopathy, HFrEF: EF 25-30%. On Entresto, Spironolactone, Farxiga, and Coreg Hypothyroid: On Synthroid  Bailey C Stehler, PA-C  Patient seen and  examined, history and physical performed, cath images and records reviewed.  51 yo man with longstanding history of CAD including multiple stents in past.  Presented with fatigue and unstable CP.  Cath showed patent LAD stent, CTO of previously stented RCA and a 90% ostial stenosis of the circumflex. He is not interested in high risk PCI.  CABG indicated for relief of symptoms and likely survival benefit despite the lack of significant LAD disease.  I discussed the general nature of the procedure, including the need for general anesthesia, the incisions to be used, the use of cardiopulmonary bypass, and the use of drainage tubes and temporary pacemaker wires with Mr. Carias. We discussed the expected hospital stay, overall recovery and short and long term outcomes. He understands the procedure is palliative, not curative.    He understands the risks include, but are not limited to death, stroke, MI, DVT/PE, bleeding, possible need for transfusion, infections, cardiac arrhythmias, as well as other organ system dysfunction including respiratory, renal, or GI complications.   He accepts the risks and agrees to proceed.   For CABG x 1 with LIMA to OM1 on Monday 7/1  Will check RCA branches to see if there are any graftable targets, although there do not appear to be based on multiple previous catheterizations.  Kristilyn Coltrane C. Jamonica Schoff, MD [facility] [phone]  

## 2023-06-22 NOTE — Progress Notes (Signed)
Interventional Cardiology Note: Pt transferred to [facility] for CABG consult with Dr Dorris Fetch. Seen by Dr End this am and underwent cardiac catheterization. The patient is CP-free at rest and his radial cath site is clear. He will undergo cardiac surgical consultation for consideration of bypass of a large OM/intermediate +/- right PDA. Pt with severe LV dysfunction but right heart cath today showed very well compensated hemodynamics and he has no HF symptoms.   Pt would be a candidate for high-risk PCI if he is not a candidate for surgery in the setting of severe LV dysfunction. Suspect he would do well with PCI in short term but for this young patient his risk of intermediate-long term ISR with ostial circ/distal left main stenting would be significant. Pt prefers surgical revascularization if he is found to be a candidate.   Tonny Bollman MD 06/22/2023 4:09 PM

## 2023-06-22 NOTE — Discharge Summary (Addendum)
Discharge Summary    Patient ID: Bobby [name] MRN: [id number]; DOB: [birth year]  Admit date: 06/22/2023 Discharge date: 06/22/2023  PCP:  Dana Allan, MD   [facility] Providers Cardiologist:  Julien Nordmann, MD   {   Discharge Diagnoses    Principal Problem:   Unstable angina [name]) Active Problems:   HTN (hypertension)   Ischemic cardiomyopathy   HLD (hyperlipidemia)   Hypothyroidism   HFrEF (heart failure with reduced ejection fraction) (HCC)   Coronary artery disease involving native coronary artery of native heart with unstable angina pectoris [facility])    Diagnostic Studies/Procedures    Right and left Heart cath 06/22/23 Conclusions: Significant two-vessel coronary artery disease, including 90% ostial LCx stenosis (ostium appears jailed by proximal LAD stent extending back into the distal LMCA) and chronic total occlusion of proximal RCA stent. Widely patent ostial/proximal LAD stent. Severely reduced left ventricular systolic function (LVEF 25-35%). Upper normal to mildly elevated left and right heart filling pressures (LVEDP 16 mmHg, PCWP 16 mm Hg, mean RA 7). Low normal to mildly reduced cardiac output/index (Fick CO 5.1 L/min, CI 2.3 L/min/m^2). Angiographically normal abdominal aorta, iliac arteries, and right common femoral artery.   Recommendations: Transfer to [name] for cardiac surgery consultation.  I would favor bypass of OM1 +/- OM3 and distal RCA branches.  PCI would be high risk and would require bifurcation stenting from LMCA into OM1 with hemodynamic support if the patient is not a candidate for bypass. Start heparin infusion 2 hours after TR band removal. Consider consultation with advanced heart failure team when the patient reaches [name] to continue optimization of HFrEF in anticipation of revascularization. Aggressive secondary prevention of coronary artery disease.   Yvonne Kendall, MD [name]   Antiplatelet/Anticoag  Continue aspirin 81 mg daily.  Start heparin infusion 2 hours after TR band removal in the setting of paroxysmal atrial fibrillation and severe ostial LCx disease.  Defer P2Y12 inhibitor and DOAC pending cardiac surgery consultation.  Discharge Date Anticipated discharge date to be determined. Plan transfer to [name] for cardiac surgery consultation and CABG versus high-risk PCI.   Coronary Diagrams  Diagnostic Dominance: Right    Peripheral Vascular Diagram  Diagnostic   _____________   History of Present Illness     Bobby [name] is a 52 y.o. male with a history of hypotestosterone, hypothyroidism, h/o smoking, CAD with prior stenting, HFmrEF 40-45%, ICM, Afib on Eliquis,  HLD who presented with outpatient cardiac catheterization.   Patient previously followed with Dr. Rosemary Holms. He has a complicated CAD history with remote stenting x2 o the RCA in 2003. In July 2018, the patient presented as Anterior STEMI. Cardiac cath showed occluded RCA and 100% pLAD disease, 80% diagonal disease, 70% OM disease with collateral from L>R. LAD was stented. Echo showed LVEF 30-35%. Repeat cath in August 2018 showed patent LAD stent, occluded pRCA ISR, 50% ramus disease. Repeat coronary angiography in 07/2020 showed patent ostial LAD stent, 20% ostial diag stenosis,  20% LCX OM1 stenosis, prox in stent CTO with L>L collaterals to mRCA. Medical management was recommended. Echo 06/2020 showed LVEF 40-45%.   In 07/2022 the patient switched to Dr. Mariah Milling. Stress test 07/2022 showed abnormal perfusion with a fixed defect, LVEF 41%, overall intermediate risk.  The patient was seen in the ER 06/15/23 for chest pain found to have new onset Afib started on Eliquis 5mg  BID. The patient spontaneously converted in the ER. HS trop up to 97.  The patient was  seen in the office 06/18/23 for new diagnosis of Afib in the ER. He complained of chest pain and was set up for outpatient cardiac catheterization.   Hospital  Course     Consultants: CTCS once at [name]  The patient arrived 06/22/23 for scheduled right and left cardiac catheterization. Cath showed significant 2V CAD, including 90% ostial Lcx stenosis (ostium jailed by pLAD stent extending back into the distal LMCA) and CTO of the pRCA stent, widely patent ostial/proximal LAD stent, LVEF 25-30%, upper normal to mildly elevated left and right heart filling pressures, low normal to mildly reduced CO, angiographically normal abdominal aorta, iliac arteries and right common femoral artery. Patient tolerated the procedure well. Plan to transfer to [name] for CABG consultation. Plan to start IV heparin 2 hours after TR band removal. Continue Aspirin 81mg  daily, Lipitor 20mg  daily, Coreg 3.125mg  BID, Farxiga 10mg  daily, Zetia 10mg  daily, Corlanor 5mg  BID, Entresto 24-26mg BID, and spironolactone 25mg  daily. We will continue to hold Eliquis and P2Y12i given possible surgery. We will order an echocardiogram and EKG, which can hopefully be done at [name].   Constitutional:  oriented to person, place, and time. No distress.  HENT:  Head: Grossly normal Eyes:  no discharge. No scleral icterus.  Neck: No JVD, no carotid bruits  Cardiovascular: Regular rate and rhythm, no murmurs appreciated Pulmonary/Chest: Clear to auscultation bilaterally, no wheezes or rails Abdominal: Soft.  no distension.  no tenderness.  Musculoskeletal: Normal range of motion Neurological:  normal muscle tone. Coordination normal. No atrophy Skin: Skin warm and dry Psychiatric: normal affect, pleasant  The patient was evaluated by Dr. Okey Dupre 06/22/23 and felt to be stable for discharge with transfer to [name].   Did the patient have an acute coronary syndrome (MI, NSTEMI, STEMI, etc) this admission?:  No                               Did the patient have a percutaneous coronary intervention (stent / angioplasty)?:  No.        The patient will be scheduled for a TOC follow up appointment in 14 days.  A  message has been sent to the [name] and Scheduling Pool at the office where the patient should be seen for follow up.  _____________  Discharge Vitals Blood pressure 94/76, pulse (!) 56, temperature 97.9 F (36.6 C), temperature source Oral, resp. rate 13, height 6\' 1"  (1.854 m), weight 97.5 kg, SpO2 96 %.  Filed Weights   06/22/23 0649  Weight: 97.5 kg    Labs & Radiologic Studies    CBC Recent Labs    06/22/23 0835 06/22/23 0842  HGB 15.0 15.3  HCT 44.0 45.0   Basic Metabolic Panel Recent Labs    62/70/35 0835 06/22/23 0842  NA 137 138  K 4.0 4.1   Liver Function Tests No results for input(s): "AST", "ALT", "ALKPHOS", "BILITOT", "PROT", "ALBUMIN" in the last 72 hours. No results for input(s): "LIPASE", "AMYLASE" in the last 72 hours. High Sensitivity Troponin:   Recent Labs  Lab 06/15/23 0641 06/15/23 0954  TROPONINIHS 22* 97*    BNP Invalid input(s): "POCBNP" D-Dimer No results for input(s): "DDIMER" in the last 72 hours. Hemoglobin A1C No results for input(s): "HGBA1C" in the last 72 hours. Fasting Lipid Panel No results for input(s): "CHOL", "HDL", "LDLCALC", "TRIG", "CHOLHDL", "LDLDIRECT" in the last 72 hours. Thyroid Function Tests No results for input(s): "TSH", "T4TOTAL", "T3FREE", "THYROIDAB" in  the last 72 hours.  Invalid input(s): "FREET3" _____________  CARDIAC CATHETERIZATION  Result Date: 06/22/2023 Conclusions: Significant two-vessel coronary artery disease, including 90% ostial LCx stenosis (ostium appears jailed by proximal LAD stent extending back into the distal LMCA) and chronic total occlusion of proximal RCA stent. Widely patent ostial/proximal LAD stent. Severely reduced left ventricular systolic function (LVEF 25-35%). Upper normal to mildly elevated left and right heart filling pressures (LVEDP 16 mmHg, PCWP 16 mm Hg, mean RA 7). Low normal to mildly reduced cardiac output/index (Fick CO 5.1 L/min, CI 2.3 L/min/m^2). Angiographically  normal abdominal aorta, iliac arteries, and right common femoral artery. Recommendations: Transfer to [name] for cardiac surgery consultation.  I would favor bypass of OM1 +/- OM3 and distal RCA branches.  PCI would be high risk and would require bifurcation stenting from LMCA into OM1 with hemodynamic support if the patient is not a candidate for bypass. Start heparin infusion 2 hours after TR band removal. Consider consultation with advanced heart failure team when the patient reaches [name] to continue optimization of HFrEF in anticipation of revascularization. Aggressive secondary prevention of coronary artery disease. Yvonne Kendall, MD [name]  PERIPHERAL VASCULAR CATHETERIZATION  Result Date: 06/22/2023 Conclusions: Significant two-vessel coronary artery disease, including 90% ostial LCx stenosis (ostium appears jailed by proximal LAD stent extending back into the distal LMCA) and chronic total occlusion of proximal RCA stent. Widely patent ostial/proximal LAD stent. Severely reduced left ventricular systolic function (LVEF 25-35%). Upper normal to mildly elevated left and right heart filling pressures (LVEDP 16 mmHg, PCWP 16 mm Hg, mean RA 7). Low normal to mildly reduced cardiac output/index (Fick CO 5.1 L/min, CI 2.3 L/min/m^2). Angiographically normal abdominal aorta, iliac arteries, and right common femoral artery. Recommendations: Transfer to [name] for cardiac surgery consultation.  I would favor bypass of OM1 +/- OM3 and distal RCA branches.  PCI would be high risk and would require bifurcation stenting from LMCA into OM1 with hemodynamic support if the patient is not a candidate for bypass. Start heparin infusion 2 hours after TR band removal. Consider consultation with advanced heart failure team when the patient reaches [name] to continue optimization of HFrEF in anticipation of revascularization. Aggressive secondary prevention of coronary artery disease. Yvonne Kendall, MD [name]  DG Chest Portable 1 View  Result Date: 06/15/2023 CLINICAL DATA:  New onset atrial fibrillation EXAM: PORTABLE CHEST 1 VIEW COMPARISON:  06/29/2020 FINDINGS: Artifact from EKG leads. Normal heart size and mediastinal contours. No acute infiltrate or edema. No effusion or pneumothorax. No acute osseous findings. IMPRESSION: No active disease. Electronically Signed   By: Tiburcio Pea M.D.   On: 06/15/2023 07:03   Disposition   Pt is being discharged home today in good condition.  Follow-up Plans & Appointments     Follow-up Information     Antonieta Iba, MD Follow up on 07/09/2023.   Specialty: Cardiology Contact information: [address] [place] [address] [phone]                Discharge Instructions     Diet - low sodium heart healthy   Complete by: As directed    Increase activity slowly   Complete by: As directed         Discharge Medications   Allergies as of 06/22/2023   No Known Allergies      Medication List     TAKE these medications    ALPRAZolam 0.5 MG tablet Commonly  known as: XANAX Take 1 tablet (0.5 mg total) by mouth at bedtime as needed.   apixaban 5 MG Tabs tablet Commonly known as: ELIQUIS Take 1 tablet (5 mg total) by mouth 2 (two) times daily. Await additional instructions once at [facility] What changed: additional instructions   aspirin 81 MG chewable tablet Chew 1 tablet (81 mg total) by mouth daily.   atorvastatin 20 MG tablet Commonly known as: LIPITOR Take 1 tablet (20 mg total) by mouth daily.   carvedilol 3.125 MG tablet Commonly known as: COREG Take 1 tablet (3.125 mg total) by mouth 2 (two) times daily.   Corlanor 5 MG Tabs tablet Generic drug: ivabradine Take 1 tablet (5 mg total) by mouth 2 (two) times daily with a meal.   Entresto 24-26 MG Generic drug: sacubitril-valsartan Take 1 tablet by mouth 2 (two) times daily.   ezetimibe 10 MG tablet Commonly  known as: ZETIA Take 1 tablet (10 mg total) by mouth daily.   Farxiga 10 MG Tabs tablet Generic drug: dapagliflozin propanediol Take 1 tablet (10 mg total) by mouth daily before breakfast.   levothyroxine 137 MCG tablet Commonly known as: SYNTHROID Take 1 tablet (137 mcg total) by mouth daily.   nitroGLYCERIN 0.4 MG SL tablet Commonly known as: NITROSTAT PLACE 1 TABLET UNDER THE TONGUE EVERY 5 MINUTES AS NEEDED FOR CHEST PAIN.   pantoprazole 40 MG tablet Commonly known as: PROTONIX Take 1 tablet (40 mg total) by mouth 2 (two) times daily.   Repatha SureClick 140 MG/ML Soaj Generic drug: Evolocumab Inject 140mg  under the skin every 14 days (inject 140mg  every 14 days)   sildenafil 100 MG tablet Commonly known as: VIAGRA Take 1 tablet (100 mg total) by mouth as needed for erectile dysfunction. TAKE 1 (ONE) TABLET AS NEEDED. DO NOT USE AT SAME TIME AS NITRO   spironolactone 25 MG tablet Commonly known as: ALDACTONE Take 1 tablet (25 mg total) by mouth daily.           Outstanding Labs/Studies   None  Duration of Discharge Encounter   Greater than 30 minutes including physician time.  Signed, Watt Geiler David Stall, PA-C 06/22/2023, 10:22 AM

## 2023-06-22 NOTE — Progress Notes (Signed)
*  PRELIMINARY RESULTS* Echocardiogram 2D Echocardiogram has been performed.  Bobby [name] 06/22/2023, 1:03 PM

## 2023-06-22 NOTE — Progress Notes (Signed)
ANTICOAGULATION CONSULT NOTE  Pharmacy Consult for heparin Indication: chest pain/ACS  No Known Allergies  Patient Measurements: Heparin dosing wt: 98kg  Vital Signs: Temp: 98.6 F (37 C) (06/28 2007) Temp Source: Oral (06/28 2007) BP: 101/63 (06/28 2007) Pulse Rate: 67 (06/28 2007)  Labs: Recent Labs    06/22/23 0835 06/22/23 0842 06/22/23 2047  HGB 15.0 15.3  --   HCT 44.0 45.0  --   APTT  --   --  54*  HEPARINUNFRC  --   --  0.34     Estimated Creatinine Clearance: 80.8 mL/min (A) (by C-G formula based on SCr of 1.3 mg/dL (H)).   Medical History: Past Medical History:  Diagnosis Date   Acute MI anterior wall first episode care (HCC) 07/13/2017   Occluded ostial LAD S/P 3.5x26 Onyx DES. RI 80%. RCA Occluded at prior stent site with collaterals    Cardiogenic shock (HCC) 07/13/2017   CHF (congestive heart failure) (HCC)    Graves disease    Hypertension    MI (myocardial infarction) (HCC)    Thyroid disease    thyroid irradiated per pt    Medications:  Medications Prior to Admission  Medication Sig Dispense Refill Last Dose   apixaban (ELIQUIS) 5 MG TABS tablet Take 1 tablet (5 mg total) by mouth 2 (two) times daily. Await additional instructions once at [facility] 60 tablet 0 06/18/2023 at 0800   aspirin 81 MG chewable tablet Chew 1 tablet (81 mg total) by mouth daily. 30 tablet 11 06/22/2023   atorvastatin (LIPITOR) 20 MG tablet Take 1 tablet (20 mg total) by mouth daily. 90 tablet 0 06/21/2023   carvedilol (COREG) 3.125 MG tablet Take 1 tablet (3.125 mg total) by mouth 2 (two) times daily. 180 tablet 3 06/22/2023 at 0800   dapagliflozin propanediol (FARXIGA) 10 MG TABS tablet Take 1 tablet (10 mg total) by mouth daily before breakfast. 90 tablet 2 06/22/2023   Evolocumab (REPATHA SURECLICK) 140 MG/ML SOAJ inject 140mg  every 14 days (Patient taking differently: Inject 140 mg into the skin every 14 (fourteen) days.) 6 mL 4 06/16/2023   ezetimibe (ZETIA) 10 MG tablet  Take 1 tablet (10 mg total) by mouth daily. 90 tablet 0 06/21/2023   levothyroxine (SYNTHROID) 137 MCG tablet Take 1 tablet (137 mcg total) by mouth daily. 30 tablet 0 06/22/2023   pantoprazole (PROTONIX) 40 MG tablet Take 1 tablet (40 mg total) by mouth 2 (two) times daily. 60 tablet 3 06/22/2023   sacubitril-valsartan (ENTRESTO) 24-26 MG Take 1 tablet by mouth 2 (two) times daily. 180 tablet 0 06/22/2023   sildenafil (VIAGRA) 100 MG tablet Take 1 tablet (100 mg total) by mouth as needed for erectile dysfunction. TAKE 1 (ONE) TABLET AS NEEDED. DO NOT USE AT SAME TIME AS NITRO 30 tablet 3 unknown   spironolactone (ALDACTONE) 25 MG tablet Take 1 tablet (25 mg total) by mouth daily. 90 tablet 0 06/22/2023   ALPRAZolam (XANAX) 0.5 MG tablet Take 1 tablet (0.5 mg total) by mouth at bedtime as needed. (Patient not taking: Reported on 06/22/2023) 15 tablet 0 Not Taking   ivabradine (CORLANOR) 5 MG TABS tablet Take 1 tablet (5 mg total) by mouth 2 (two) times daily with a meal. 180 tablet 0    nitroGLYCERIN (NITROSTAT) 0.4 MG SL tablet PLACE 1 TABLET UNDER THE TONGUE EVERY 5 MINUTES AS NEEDED FOR CHEST PAIN. (Patient taking differently: Place 0.4 mg under the tongue every 5 (five) minutes as needed for chest pain.) 25  tablet 2 06/15/2023   Scheduled:   Assessment: 52 yo male s/p cath at [facility] with 2VCAD for possible CABG. Pharmacy consulted to dose heparin. He is on apixaban PTA with last dose unknown -Heparin was restarted prior to transfer at 1400 units/hr  Goal of Therapy:  Heparin level 0.3-0.7 units/ml Monitor platelets by anticoagulation protocol: Yes   Plan: -Continue heparin 1400 units/hr  -Heparin level and aPTT tonight  Harland German, PharmD Clinical Pharmacist **Pharmacist phone directory can now be found on [email or web address] ([email or web address]).  Listed under [facility].   Addendum -heparin level at goal on 1400 units/hr  Plan -Continue heparin at 1400 units/hr -Daily heparin level and CBC  Harland German, PharmD Clinical Pharmacist **Pharmacist phone directory can now be found on [email or web address] ([email or web address]).  Listed under [facility].

## 2023-06-22 NOTE — Consult Note (Addendum)
Pharmacy Consult Note - Anticoagulation  Pharmacy Consult for heparin Indication: chest pain/ACS and atrial fibrillation  PATIENT MEASUREMENTS: Height: 6\' 1"  (185.4 cm) Weight: 97.5 kg (215 lb) IBW/kg (Calculated) : 79.9 HEPARIN DW (KG): 97.5  VITAL SIGNS: Temp: 97.9 F (36.6 C) (06/28 0649) Temp Source: Oral (06/28 0649) BP: 101/73 (06/28 1059) Pulse Rate: 54 (06/28 1059)  Recent Labs    06/22/23 0842  HGB 15.3  HCT 45.0    Estimated Creatinine Clearance: 80.8 mL/min (A) (by C-G formula based on SCr of 1.3 mg/dL (H)).  PAST MEDICAL HISTORY: Past Medical History:  Diagnosis Date   Acute MI anterior wall first episode care (HCC) 07/13/2017   Occluded ostial LAD S/P 3.5x26 Onyx DES. RI 80%. RCA Occluded at prior stent site with collaterals    Cardiogenic shock (HCC) 07/13/2017   CHF (congestive heart failure) (HCC)    Graves disease    Hypertension    MI (myocardial infarction) (HCC)    Thyroid disease    thyroid irradiated per pt    ASSESSMENT: 52 y.o. male with PMH CAD (STEMI s/p LAD revasc 2018, MI s/p stent 2003), HFmrEF (EF 40-45% in 2021), hyperthyroidism s/p irradiation (now hypothyroid) is presenting with unstable angina and NOAF now s/p LHC. Patient recently presented on 6/21 with NOAF and was prescribed apixaban 5 mg twice daily. Unknown time of last dose or if medication was started.  Today, HR currently 50s-60s, at goal for Afib. Patient underwent LHC on 6/28 and will be transferring to [name] for cardiac surgery consultation. Patient  dispense history shows recent apixaban but time of last dose or whether patient picked the medication up is inconclusive.  Pharmacy has been consulted to initiate and manage heparin intravenous infusion.   Heparin timeline this admission: Patient received 6000 units of heparin this morning at around 0800 TR band from procedure was removed this morning at 1110  Pertinent medications: Apixaban 5 mg twice daily (filled 6/21  for 30ds but unknown time of last dose or if started)  Goal(s) of therapy: Heparin level 0.3 - 0.7 units/mL aPTT 66 - 102 seconds Monitor platelets by anticoagulation protocol: Yes   Baseline anticoagulation labs: Recent Labs    06/22/23 0835 06/22/23 0842  HGB 15.0 15.3    Date Time aPTT/HL Rate/Comment   PLAN: Check baseline anti-Xa level to assess for false elevation in the event that patient took apixaban recently Give 5800 units bolus x1; then start heparin infusion at 1400 units/hour. Bolus and drip timed starting at 1315 today which corresponds to 2 hours post-TR band removal Check anti-Xa and/or aPTT in 6 hours after heparin starts, then daily once at least two levels are consecutively therapeutic. Monitor CBC daily while on heparin infusion.   Will M. Dareen Piano, PharmD PGY-1 Pharmacy Resident 06/22/2023 11:41 AM

## 2023-06-22 NOTE — H&P (View-Only) (Signed)
[address]       [place] [address]             [phone]        Bobby [name] [facility] Medical Record #[id number] Date of Birth: 07-27-1971  Referring: Yvonne Kendall, MD Primary Care: Dana Allan, MD Primary Cardiologist:Timothy Mariah Milling, MD  Chief Complaint:   No chief complaint on file.   History of Present Illness:     Bobby [name] is a 52 year old male with a past medical history of HTN, HLD, newly diagnosed paroxysmal atrial fibrillation, CAD (MI s/p 2 DES to the RCA in 2003 and anterior STEMI s/p DES to the LAD in 2018), HFrEF, ischemic cardiomyopathy and hypothyroidism.  In 2018 after emergent catheterization and revascularization he required Impella support for approximately 36-48 hours due to cardiogenic shock. In August 2021 he had complaints of occasional chest discomfort, fatigue and dyspnea, he underwent cardiac catheterization which showed a patent ostial LAD stent, 20% ostial diagonal stenosis, 20% left circumflex OM1 stenosis and proximal in-stent CTO with collaterals to the RCA, medical management was recommended at that time. Echo in 2021 showed an EF of 40-45%.  On 06/15/23 he presented to the ED with a complaint of chest pain, palpitations and dyspnea with exertion. His chest pain radiated into his throat and jaw. He states it felt like his heart attack in the past. He denied dizziness, nausea, vomiting or LOC. He took Nitroglycerin and Aspirin which improved his pain. He admits to chest discomfort with exertion for the last couple of weeks. While in the ED his Troponin I (high sensitivity) peaked at 97. His EKG showed no sign of acute ischemia but he was found to be in atrial fibrillation with RVR. He converted to normal sinus rhythm with IV Diltiazem and was started on Eliquis and discharged home. On 06/22/23 he underwent cardiac catheterization which showed significant two vessel CAD including 90% ostial Lcx stenosis (ostium jailed by pLAD stent  extending back to the distal LMCA) and chronic total occlusion of the proximal RCA stent with a widely patent ostial/proximal LAD stent as well as upper normal to mildly elevated left and right heart filling pressures. His echocardiogram on 06/22/23 showed LVEF 25-30% with mild left ventricular hypertrophy, grade I diastolic dysfunction and akinesis of the left ventricular, anterior wall and apical segment.  The patient has a family history of heart disease in both parents and his father underwent bypass surgery in his 11s. He is a former smoker who states he quit in 2018 but still has a couple cigarettes per month. He works at an [employer]. He lives with wife and has two flights of stairs to get into his apartment. He is right hand dominant.    Current Activity/ Functional Status: Patient is independent with mobility/ambulation, transfers, ADL's, IADL's.   Zubrod Score: At the time of surgery this patient's most appropriate activity status/level should be described as: []     0    Normal activity, no symptoms [x]     1    Restricted in physical strenuous activity but ambulatory, able to do out light work []     2    Ambulatory and capable of self care, unable to do work activities, up and about                 more than 50%  Of the time                            []   3    Only limited self care, in bed greater than 50% of waking hours []     4    Completely disabled, no self care, confined to bed or chair []     5    Moribund  Past Medical History:  Diagnosis Date   Acute MI anterior wall first episode care (HCC) 07/13/2017   Occluded ostial LAD S/P 3.5x26 Onyx DES. RI 80%. RCA Occluded at prior stent site with collaterals    Cardiogenic shock (HCC) 07/13/2017   CHF (congestive heart failure) (HCC)    Graves disease    Hypertension    MI (myocardial infarction) (HCC)    Thyroid disease    thyroid irradiated per pt    Past Surgical History:  Procedure Laterality Date   CORONARY  ANGIOPLASTY WITH STENT PLACEMENT     CORONARY/GRAFT ACUTE MI REVASCULARIZATION N/A 07/13/2017   Procedure: Coronary/Graft Acute MI Revascularization;  Surgeon: Yates Decamp, MD;  Location: [facility] INVASIVE CV LAB;  Service: Cardiovascular;  Laterality: N/A;   ESOPHAGOGASTRODUODENOSCOPY N/A 06/30/2020   Procedure: ESOPHAGOGASTRODUODENOSCOPY (EGD);  Surgeon: Toledo, Boykin Nearing, MD;  Location: [facility] ENDOSCOPY;  Service: Gastroenterology;  Laterality: N/A;   LEFT HEART CATH AND CORONARY ANGIOGRAPHY N/A 07/13/2017   Procedure: Left Heart Cath and Coronary Angiography;  Surgeon: Yates Decamp, MD;  Location: [facility] INVASIVE CV LAB;  Service: Cardiovascular;  Laterality: N/A;   LEFT HEART CATH AND CORONARY ANGIOGRAPHY N/A 08/21/2017   Procedure: LEFT HEART CATH AND CORONARY ANGIOGRAPHY;  Surgeon: Elder Negus, MD;  Location: [facility] INVASIVE CV LAB;  Service: Cardiovascular;  Laterality: N/A;   LEFT HEART CATH AND CORONARY ANGIOGRAPHY N/A 08/10/2020   Procedure: LEFT HEART CATH AND CORONARY ANGIOGRAPHY;  Surgeon: Elder Negus, MD;  Location: [facility] INVASIVE CV LAB;  Service: Cardiovascular;  Laterality: N/A;   SKIN GRAFT     VENTRICULAR ASSIST DEVICE INSERTION N/A 07/13/2017   Procedure: Ventricular Assist Device Insertion;  Surgeon: Yates Decamp, MD;  Location: [facility] INVASIVE CV LAB;  Service: Cardiovascular;  Laterality: N/A;    Social History   Tobacco Use  Smoking Status Former   Packs/day: 0.50   Years: 20.00   Additional pack years: 0.00   Total pack years: 10.00   Types: Cigarettes   Quit date: 07/13/2017   Years since quitting: 5.9  Smokeless Tobacco Never  Tobacco Comments   quit 2012    Social History   Substance and Sexual Activity  Alcohol Use Yes   Comment: occ     No Known Allergies  No current facility-administered medications for this encounter.    Medications Prior to Admission  Medication Sig Dispense Refill Last Dose   ALPRAZolam (XANAX) 0.5 MG tablet Take 1 tablet (0.5 mg total)  by mouth at bedtime as needed. 15 tablet 0    apixaban (ELIQUIS) 5 MG TABS tablet Take 1 tablet (5 mg total) by mouth 2 (two) times daily. Await additional instructions once at [facility] 60 tablet 0    aspirin 81 MG chewable tablet Chew 1 tablet (81 mg total) by mouth daily. 30 tablet 11    atorvastatin (LIPITOR) 20 MG tablet Take 1 tablet (20 mg total) by mouth daily. 90 tablet 0    carvedilol (COREG) 3.125 MG tablet Take 1 tablet (3.125 mg total) by mouth 2 (two) times daily. 180 tablet 3    dapagliflozin propanediol (FARXIGA) 10 MG TABS tablet Take 1 tablet (10 mg total) by mouth daily before breakfast. 90 tablet 2    Evolocumab (  REPATHA SURECLICK) 140 MG/ML SOAJ inject 140mg  every 14 days 6 mL 4    ezetimibe (ZETIA) 10 MG tablet Take 1 tablet (10 mg total) by mouth daily. 90 tablet 0    ivabradine (CORLANOR) 5 MG TABS tablet Take 1 tablet (5 mg total) by mouth 2 (two) times daily with a meal. 180 tablet 0    levothyroxine (SYNTHROID) 137 MCG tablet Take 1 tablet (137 mcg total) by mouth daily. 30 tablet 0    nitroGLYCERIN (NITROSTAT) 0.4 MG SL tablet PLACE 1 TABLET UNDER THE TONGUE EVERY 5 MINUTES AS NEEDED FOR CHEST PAIN. 25 tablet 2    pantoprazole (PROTONIX) 40 MG tablet Take 1 tablet (40 mg total) by mouth 2 (two) times daily. 60 tablet 3    sacubitril-valsartan (ENTRESTO) 24-26 MG Take 1 tablet by mouth 2 (two) times daily. 180 tablet 0    sildenafil (VIAGRA) 100 MG tablet Take 1 tablet (100 mg total) by mouth as needed for erectile dysfunction. TAKE 1 (ONE) TABLET AS NEEDED. DO NOT USE AT SAME TIME AS NITRO 30 tablet 3    spironolactone (ALDACTONE) 25 MG tablet Take 1 tablet (25 mg total) by mouth daily. 90 tablet 0     Family History  Problem Relation Age of Onset   Valvular heart disease Mother    Heart disease Father 55       CABG   Hypertension Father    Heart disease Paternal Grandmother    Review of Systems:  Review of Systems  Constitutional:  Positive for  malaise/fatigue. Negative for chills, diaphoresis and fever.  HENT:  Positive for tinnitus. Negative for hearing loss.   Eyes:  Positive for blurred vision. Negative for double vision.  Respiratory:  Positive for cough, sputum production and shortness of breath. Negative for wheezing.   Cardiovascular:  Positive for chest pain, palpitations and orthopnea. Negative for claudication and leg swelling.  Gastrointestinal:  Positive for heartburn. Negative for nausea and vomiting.  Genitourinary:  Negative for dysuria.  Musculoskeletal:  Positive for myalgias.       No varicose veins  Neurological:  Negative for dizziness, loss of consciousness and weakness.  Endo/Heme/Allergies:  Does not bruise/bleed easily.  Psychiatric/Behavioral:  Positive for depression. Negative for suicidal ideas. The patient is nervous/anxious.   Dental visit last month   Physical Exam: Today's Vitals   06/22/23 1505  BP: 102/78  Pulse: (!) 59  Resp: 20  Temp: 98.3 F (36.8 C)  TempSrc: Oral  SpO2: 97%  Weight: 97.5 kg  Height: 6\' 1"  (1.854 m)  PainSc: 0-No pain   Body mass index is 28.37 kg/m.  General appearance: alert, cooperative, and no distress Neck: no adenopathy, no carotid bruit, no JVD, supple, symmetrical, trachea midline, and thyroid not enlarged, symmetric, no tenderness/mass/nodules Lymph nodes: Cervical, supraclavicular, and axillary nodes normal. Resp: clear to auscultation bilaterally Cardio: regular rate and rhythm, S1, S2 normal, no murmur, click, rub or gallop Extremities: extremities normal, atraumatic, no cyanosis or edema, 2+ DP/PT pulses bilaterally Neurologic: Grossly normal  Diagnostic Studies & Recent Radiology Findings:     ABDOMINAL AORTOGRAM  RIGHT/LEFT HEART CATH AND CORONARY ANGIOGRAPHY   Conclusions: Significant two-vessel coronary artery disease, including 90% ostial LCx stenosis (ostium appears jailed by proximal LAD stent extending back into the distal LMCA) and  chronic total occlusion of proximal RCA stent. Widely patent ostial/proximal LAD stent. Severely reduced left ventricular systolic function (LVEF 25-35%). Upper normal to mildly elevated left and right heart filling pressures (LVEDP 16  mmHg, PCWP 16 mm Hg, mean RA 7). Low normal to mildly reduced cardiac output/index (Fick CO 5.1 L/min, CI 2.3 L/min/m^2). Angiographically normal abdominal aorta, iliac arteries, and right common femoral artery.   Left Main  Vessel is large.    Left Anterior Descending  Vessel is large.  Non-stenotic Ost LAD to Prox LAD lesion was previously treated.    First Diagonal Branch  Vessel is moderate in size.  Ost 1st Diag lesion is 25% stenosed.    Ramus Intermedius  Ost Ramus lesion is 50% stenosed.    Left Circumflex  Vessel is large.  Ost Cx lesion is 90% stenosed. Vessel is the culprit lesion.    First Obtuse Marginal Branch  Vessel is large in size.  1st Mrg lesion is 25% stenosed.    Second Obtuse Marginal Branch  Vessel is small in size.    Third Obtuse Marginal Branch  Vessel is moderate in size.    Right Coronary Artery  Collaterals  Mid RCA filled by collaterals from Dist LAD.    Collaterals  Mid RCA filled by collaterals from Dist Cx.    Prox RCA lesion is 100% stenosed. The lesion was previously treated . Known CTO. RCA not engaged today     Dominance: Right  ECHOCARDIOGRAM REPORT   Patient Name:   Bobby [name] Date of Exam: 06/22/2023  Medical Rec #:  [id number]   Height:       73.0 in  Accession #:    [id number]  Weight:       215.0 lb  Date of Birth:  07/06/1971  BSA:          2.219 m  Patient Age:    51 years    BP:           100/79 mmHg  Patient Gender: M           HR:           63 bpm.  Exam Location:  [facility]   Procedure: 2D Echo, Cardiac Doppler, Color Doppler and Intracardiac             Opacification Agent   Indications:     Acute ischemic heart disease, unspecified I24.9    History:         Patient has prior  history of Echocardiogram examinations,  most                  recent 07/14/2017. CHF; Previous Myocardial Infarction.    Sonographer:     Cristela Blue  Referring Phys:  [id number] CHRISTOPHER END  Diagnosing Phys: Yvonne Kendall MD     Sonographer Comments: Suboptimal parasternal window and suboptimal apical  window.  IMPRESSIONS   1. Left ventricular ejection fraction, by estimation, is 25 to 30%. The  left ventricle has severely decreased function. The left ventricle  demonstrates regional wall motion abnormalities (see scoring  diagram/findings for description). There is mild left   ventricular hypertrophy. Left ventricular diastolic parameters are  consistent with Grade I diastolic dysfunction (impaired relaxation). There  is akinesis of the left ventricular, entire anterior wall and apical  segment.   2. Right ventricular systolic function is mildly reduced. The right  ventricular size is mildly enlarged. Tricuspid regurgitation signal is  inadequate for assessing PA pressure.   3. The mitral valve was not well visualized. No evidence of mitral valve  regurgitation. No evidence of mitral stenosis.   4. The aortic valve was not well visualized. Aortic valve regurgitation  is not visualized. No aortic stenosis is present.   FINDINGS   Left Ventricle: Left ventricular ejection fraction, by estimation, is 25  to 30%. The left ventricle has severely decreased function. The left  ventricle demonstrates regional wall motion abnormalities. Definity  contrast agent was given IV to delineate  the left ventricular endocardial borders. The left ventricular internal  cavity size was normal in size. There is mild left ventricular  hypertrophy. Left ventricular diastolic parameters are consistent with  Grade I diastolic dysfunction (impaired  relaxation).   Right Ventricle: The right ventricular size is mildly enlarged. No  increase in right ventricular wall thickness. Right ventricular  systolic  function is mildly reduced. Tricuspid regurgitation signal is inadequate  for assessing PA pressure.   Left Atrium: Left atrial size was normal in size.   Right Atrium: Right atrial size was normal in size.   Pericardium: There is no evidence of pericardial effusion.   Mitral Valve: The mitral valve was not well visualized. No evidence of  mitral valve regurgitation. No evidence of mitral valve stenosis. MV peak  gradient, 2.4 mmHg. The mean mitral valve gradient is 1.0 mmHg.   Tricuspid Valve: The tricuspid valve is not well visualized. Tricuspid  valve regurgitation is trivial.   Aortic Valve: The aortic valve was not well visualized. Aortic valve  regurgitation is not visualized. No aortic stenosis is present. Aortic  valve mean gradient measures 2.0 mmHg. Aortic valve peak gradient measures  3.4 mmHg. Aortic valve area, by VTI  measures 2.96 cm.   Pulmonic Valve: The pulmonic valve was not well visualized. Pulmonic valve  regurgitation is not visualized. No evidence of pulmonic stenosis.   Aorta: The aortic root is normal in size and structure.   Pulmonary Artery: The pulmonary artery is not well seen.   Venous: The inferior vena cava was not well visualized.   IAS/Shunts: The interatrial septum was not well visualized.     LEFT VENTRICLE  PLAX 2D  LVIDd:         4.70 cm      Diastology  LVIDs:         4.00 cm      LV e' medial:    5.44 cm/s  LV PW:         1.10 cm      LV E/e' medial:  11.9  LV IVS:        1.10 cm      LV e' lateral:   7.51 cm/s  LVOT diam:     2.00 cm      LV E/e' lateral: 8.7  LV SV:         47  LV SV Index:   21  LVOT Area:     3.14 cm    LV Volumes (MOD)  LV vol d, MOD A2C: 86.1 ml  LV vol d, MOD A4C: 150.0 ml  LV vol s, MOD A2C: 74.1 ml  LV vol s, MOD A4C: 106.0 ml  LV SV MOD A2C:     12.0 ml  LV SV MOD A4C:     150.0 ml  LV SV MOD BP:      22.5 ml   RIGHT VENTRICLE  RV Basal diam:  4.90 cm  RV Mid diam:    3.60 cm  RV S  prime:     10.80 cm/s  TAPSE (M-mode): 1.4 cm   LEFT ATRIUM           Index  RIGHT ATRIUM           Index  LA diam:      3.30 cm 1.49 cm/m   RA Area:     17.00 cm  LA Vol (A2C): 53.4 ml 24.07 ml/m  RA Volume:   49.00 ml  22.08 ml/m  LA Vol (A4C): 25.2 ml 11.36 ml/m   AORTIC VALVE  AV Area (Vmax):    2.63 cm  AV Area (Vmean):   2.72 cm  AV Area (VTI):     2.96 cm  AV Vmax:           91.60 cm/s  AV Vmean:          60.300 cm/s  AV VTI:            0.160 m  AV Peak Grad:      3.4 mmHg  AV Mean Grad:      2.0 mmHg  LVOT Vmax:         76.60 cm/s  LVOT Vmean:        52.300 cm/s  LVOT VTI:          0.151 m  LVOT/AV VTI ratio: 0.94    AORTA  Ao Root diam: 2.60 cm   MITRAL VALVE  MV Area (PHT): 3.31 cm    SHUNTS  MV Area VTI:   2.08 cm    Systemic VTI:  0.15 m  MV Peak grad:  2.4 mmHg    Systemic Diam: 2.00 cm  MV Mean grad:  1.0 mmHg  MV Vmax:       0.78 m/s  MV Vmean:      43.1 cm/s  MV Decel Time: 229 msec  MV E velocity: 65.00 cm/s  MV A velocity: 79.10 cm/s  MV E/A ratio:  0.82   Christopher End MD  Electronically signed by Yvonne Kendall MD  Signature Date/Time: 06/22/2023/1:05:21 PM      Final     Assessment/Plan: CAD: Significant 2 vessel disease including in-stent CTO of the RCA and 90% stenosis of the ostial Lcx. His last Eliquis dose was on Monday. He is currently on IV Heparin. Pt would benefit from CABG and seems to be a reasonable candidate, but will be higher risk due to low LVEF. Possible radial artery harvest. Of note, the patient would prefer CABG surgery over further PCI. Dr. Dorris Fetch to determine candidacy and timing, next OR availabilities are 07/01 or 07/03. HTN: On Coreg and Entresto HLD: On Repatha, Lipitor, and Zetia PAF: In NSR. On IV Heparin, last Eliquis dose on Monday Ischemic cardiomyopathy, HFrEF: EF 25-30%. On Entresto, Spironolactone, Farxiga, and Coreg Hypothyroid: On Synthroid  Jenny Reichmann, PA-C  Patient seen and  examined, history and physical performed, cath images and records reviewed.  52 yo man with longstanding history of CAD including multiple stents in past.  Presented with fatigue and unstable CP.  Cath showed patent LAD stent, CTO of previously stented RCA and a 90% ostial stenosis of the circumflex. He is not interested in high risk PCI.  CABG indicated for relief of symptoms and likely survival benefit despite the lack of significant LAD disease.  I discussed the general nature of the procedure, including the need for general anesthesia, the incisions to be used, the use of cardiopulmonary bypass, and the use of drainage tubes and temporary pacemaker wires with Mr. Kinner. We discussed the expected hospital stay, overall recovery and short and long term outcomes. He understands the procedure is palliative, not curative.  He understands the risks include, but are not limited to death, stroke, MI, DVT/PE, bleeding, possible need for transfusion, infections, cardiac arrhythmias, as well as other organ system dysfunction including respiratory, renal, or GI complications.   He accepts the risks and agrees to proceed.   For CABG x 1 with LIMA to OM1 on Monday 7/1  Will check RCA branches to see if there are any graftable targets, although there do not appear to be based on multiple previous catheterizations.  Salvatore Decent Dorris Fetch, MD [facility] [phone]

## 2023-06-22 NOTE — Progress Notes (Addendum)
ANTICOAGULATION CONSULT NOTE - Initial Consult  Pharmacy Consult for heparin Indication: chest pain/ACS  No Known Allergies  Patient Measurements: Heparin dosing wt: 98kg  Vital Signs: Temp: 98.1 F (36.7 C) (06/28 1343) Temp Source: Oral (06/28 0649) BP: 98/62 (06/28 1358) Pulse Rate: 59 (06/28 1358)  Labs: Recent Labs    06/22/23 0835 06/22/23 0842  HGB 15.0 15.3  HCT 44.0 45.0    Estimated Creatinine Clearance: 80.8 mL/min (A) (by C-G formula based on SCr of 1.3 mg/dL (H)).   Medical History: Past Medical History:  Diagnosis Date   Acute MI anterior wall first episode care (HCC) 07/13/2017   Occluded ostial LAD S/P 3.5x26 Onyx DES. RI 80%. RCA Occluded at prior stent site with collaterals    Cardiogenic shock (HCC) 07/13/2017   CHF (congestive heart failure) (HCC)    Graves disease    Hypertension    MI (myocardial infarction) (HCC)    Thyroid disease    thyroid irradiated per pt    Medications:  Medications Prior to Admission  Medication Sig Dispense Refill Last Dose   ALPRAZolam (XANAX) 0.5 MG tablet Take 1 tablet (0.5 mg total) by mouth at bedtime as needed. 15 tablet 0    apixaban (ELIQUIS) 5 MG TABS tablet Take 1 tablet (5 mg total) by mouth 2 (two) times daily. Await additional instructions once at [facility] 60 tablet 0    aspirin 81 MG chewable tablet Chew 1 tablet (81 mg total) by mouth daily. 30 tablet 11    atorvastatin (LIPITOR) 20 MG tablet Take 1 tablet (20 mg total) by mouth daily. 90 tablet 0    carvedilol (COREG) 3.125 MG tablet Take 1 tablet (3.125 mg total) by mouth 2 (two) times daily. 180 tablet 3    dapagliflozin propanediol (FARXIGA) 10 MG TABS tablet Take 1 tablet (10 mg total) by mouth daily before breakfast. 90 tablet 2    Evolocumab (REPATHA SURECLICK) 140 MG/ML SOAJ inject 140mg  every 14 days 6 mL 4    ezetimibe (ZETIA) 10 MG tablet Take 1 tablet (10 mg total) by mouth daily. 90 tablet 0    ivabradine (CORLANOR) 5 MG TABS tablet Take  1 tablet (5 mg total) by mouth 2 (two) times daily with a meal. 180 tablet 0    levothyroxine (SYNTHROID) 137 MCG tablet Take 1 tablet (137 mcg total) by mouth daily. 30 tablet 0    nitroGLYCERIN (NITROSTAT) 0.4 MG SL tablet PLACE 1 TABLET UNDER THE TONGUE EVERY 5 MINUTES AS NEEDED FOR CHEST PAIN. 25 tablet 2    pantoprazole (PROTONIX) 40 MG tablet Take 1 tablet (40 mg total) by mouth 2 (two) times daily. 60 tablet 3    sacubitril-valsartan (ENTRESTO) 24-26 MG Take 1 tablet by mouth 2 (two) times daily. 180 tablet 0    sildenafil (VIAGRA) 100 MG tablet Take 1 tablet (100 mg total) by mouth as needed for erectile dysfunction. TAKE 1 (ONE) TABLET AS NEEDED. DO NOT USE AT SAME TIME AS NITRO 30 tablet 3    spironolactone (ALDACTONE) 25 MG tablet Take 1 tablet (25 mg total) by mouth daily. 90 tablet 0    Scheduled:   Assessment: 52 yo male s/p cath at [facility] with 2VCAD for possible CABG. Pharmacy consulted to dose heparin. He is on apixaban PTA with last dose unknown -Heparin was restarted prior to transfer at 1400 units/hr  Goal of Therapy:  Heparin level 0.3-0.7 units/ml Monitor platelets by anticoagulation protocol: Yes   Plan: -Continue heparin 1400 units/hr  -  Heparin level and aPTT tonight  Harland German, PharmD Clinical Pharmacist **Pharmacist phone directory can now be found on [email or web address] ([email or web address]).  Listed under [facility].

## 2023-06-22 NOTE — Interval H&P Note (Signed)
History and Physical Interval Note:  06/22/2023 7:37 AM  Bobby [name]  has presented today for surgery, with the diagnosis of unstable angina.  The various methods of treatment have been discussed with the patient and family. After consideration of risks, benefits and other options for treatment, the patient has consented to  Procedure(s): LEFT HEART CATH AND CORONARY ANGIOGRAPHY (N/A) as a surgical intervention.  The patient's history has been reviewed, patient examined, no change in status, stable for surgery.  I have reviewed the patient's chart and labs.  Questions were answered to the patient's satisfaction.    Cath Lab Visit (complete for each Cath Lab visit)  Clinical Evaluation Leading to the Procedure:   ACS: Yes.    Non-ACS:  N/A  [name]

## 2023-06-22 NOTE — H&P (Addendum)
Cardiology Admission History and Physical   Patient ID: Bobby [name] MRN: [id number]; DOB: 10/02/1971   Admission date: 06/22/2023  PCP:  Dana Allan, MD   [facility] Providers Cardiologist:  Julien Nordmann, MD   {   Chief Complaint:  Unstable Angina  Patient Profile:   Bobby [name] is a 52 y.o. male with a history of CAD with prior stenting, hypertension, HFrEF , ischemic cardiomyopathy, GERD, hyperlipidemia, hypothyroidism who is being seen 06/22/2023 for the evaluation of unstable angina.  History of Present Illness:   Mr. Braganza was previously followed by Dr. Rosemary Holms.  He has a complicated CAD history with remote stenting x 2 to the RCA in 2003.  In July 2018, the patient presented as an anterior STEMI.  Cardiac cath showed occluded RCA and 100% pLAD disease, 80% diagonal disease, 70% OM disease with collaterals from left to right.  The LAD was stented.  Echo showed LVEF of 30 to 35%.  Patient had a high risk stress test in August 2021 for fatigue and shortness of breath similar to prior MI and cath was recommended.  Repeat cath in August 2018 showed patent LAD stent, occluded P RCA ISR, 50% ramus disease.  Repeat coronary angiography in August 2021 showed patent ostial LAD stent, 20% ostial diagonal stenosis, 20% left circumflex OM1 stenosis, proximal in-stent CTO with left to left collaterals to mRCA.  Medical management was recommended.  Echo July 2021 showed EF of 40 to 45%.  In August 2023 the patient switched to care with Dr. Mariah Milling.  Stress test in August 2023 showed abnormal perfusion with a fixed defect, LVEF of 41%, overall intermediate risk.  The patient was seen in the ER 06/15/2023 with chest pain found to have new onset A-fib started on Eliquis 5 mg twice daily.  The patient spontaneously converted to sinus rhythm in the ER.  High-sensitivity troponin was up to 97.  Patient was seen in the office by Dr. Mariah Milling 06/18/2023 complaining of chest pain and was set  up for cardiac cath.  Patient was in normal sinus rhythm by EKG.  Patient arrived 06/22/2023 to [facility] for his scheduled procedure.  Right and left heart cath showed significant two-vessel CAD, including 90% ostial left circumflex stenosis (ostium jailed by pLAD stent extending back to the distal LMCA) and CTO of the P RCA stent, widely patent ostial/proximal LAD stent, LVEF 25 to 30%, upper normal to mildly elevated left and right heart filling pressures, low normal to mildly reduced CO, angiographically normal abdominal aorta, iliac arteries and right common femoral artery.  Patient tolerated procedure well.  Plan to transfer to [name] for CABG consultation. Echo and EKG were ordered.   Past Medical History:  Diagnosis Date   Acute MI anterior wall first episode care (HCC) 07/13/2017   Occluded ostial LAD S/P 3.5x26 Onyx DES. RI 80%. RCA Occluded at prior stent site with collaterals    Cardiogenic shock (HCC) 07/13/2017   CHF (congestive heart failure) (HCC)    Graves disease    Hypertension    MI (myocardial infarction) (HCC)    Thyroid disease    thyroid irradiated per pt    Past Surgical History:  Procedure Laterality Date   CORONARY ANGIOPLASTY WITH STENT PLACEMENT     CORONARY/GRAFT ACUTE MI REVASCULARIZATION N/A 07/13/2017   Procedure: Coronary/Graft Acute MI Revascularization;  Surgeon: Yates Decamp, MD;  Location: [facility] INVASIVE CV LAB;  Service: Cardiovascular;  Laterality: N/A;   ESOPHAGOGASTRODUODENOSCOPY N/A 06/30/2020   Procedure: ESOPHAGOGASTRODUODENOSCOPY (  EGD);  Surgeon: Toledo, Boykin Nearing, MD;  Location: [facility] ENDOSCOPY;  Service: Gastroenterology;  Laterality: N/A;   LEFT HEART CATH AND CORONARY ANGIOGRAPHY N/A 07/13/2017   Procedure: Left Heart Cath and Coronary Angiography;  Surgeon: Yates Decamp, MD;  Location: [facility] INVASIVE CV LAB;  Service: Cardiovascular;  Laterality: N/A;   LEFT HEART CATH AND CORONARY ANGIOGRAPHY N/A 08/21/2017   Procedure: LEFT HEART CATH AND CORONARY  ANGIOGRAPHY;  Surgeon: Elder Negus, MD;  Location: [facility] INVASIVE CV LAB;  Service: Cardiovascular;  Laterality: N/A;   LEFT HEART CATH AND CORONARY ANGIOGRAPHY N/A 08/10/2020   Procedure: LEFT HEART CATH AND CORONARY ANGIOGRAPHY;  Surgeon: Elder Negus, MD;  Location: [facility] INVASIVE CV LAB;  Service: Cardiovascular;  Laterality: N/A;   SKIN GRAFT     VENTRICULAR ASSIST DEVICE INSERTION N/A 07/13/2017   Procedure: Ventricular Assist Device Insertion;  Surgeon: Yates Decamp, MD;  Location: [facility] INVASIVE CV LAB;  Service: Cardiovascular;  Laterality: N/A;     Medications Prior to Admission: Prior to Admission medications   Medication Sig Start Date Hayven Fatima Date Taking? Authorizing Provider  ALPRAZolam Prudy Feeler) 0.5 MG tablet Take 1 tablet (0.5 mg total) by mouth at bedtime as needed. 05/23/23   Dana Allan, MD  apixaban (ELIQUIS) 5 MG TABS tablet Take 1 tablet (5 mg total) by mouth 2 (two) times daily. Await additional instructions once at [facility] 06/22/23   Furth, Cadence H, PA-C  aspirin 81 MG chewable tablet Chew 1 tablet (81 mg total) by mouth daily. 04/11/21   Patwardhan, Anabel Bene, MD  atorvastatin (LIPITOR) 20 MG tablet Take 1 tablet (20 mg total) by mouth daily. 04/02/23 04/01/24  Antonieta Iba, MD  carvedilol (COREG) 3.125 MG tablet Take 1 tablet (3.125 mg total) by mouth 2 (two) times daily. 05/29/23   Antonieta Iba, MD  dapagliflozin propanediol (FARXIGA) 10 MG TABS tablet Take 1 tablet (10 mg total) by mouth daily before breakfast. 03/01/23   Patwardhan, Anabel Bene, MD  Evolocumab (REPATHA SURECLICK) 140 MG/ML SOAJ inject 140mg  every 14 days 05/02/22 07/13/23  Yates Decamp, MD  ezetimibe (ZETIA) 10 MG tablet Take 1 tablet (10 mg total) by mouth daily. 04/17/23 04/16/24  Patwardhan, Anabel Bene, MD  ivabradine (CORLANOR) 5 MG TABS tablet Take 1 tablet (5 mg total) by mouth 2 (two) times daily with a meal. 04/02/23   Gollan, Tollie Pizza, MD  levothyroxine (SYNTHROID) 137 MCG tablet Take 1 tablet (137 mcg  total) by mouth daily. 05/30/23   Dana Allan, MD  nitroGLYCERIN (NITROSTAT) 0.4 MG SL tablet PLACE 1 TABLET UNDER THE TONGUE EVERY 5 MINUTES AS NEEDED FOR CHEST PAIN. 03/01/23 02/29/24  Patwardhan, Anabel Bene, MD  pantoprazole (PROTONIX) 40 MG tablet Take 1 tablet (40 mg total) by mouth 2 (two) times daily. 05/23/23   Dana Allan, MD  sacubitril-valsartan (ENTRESTO) 24-26 MG Take 1 tablet by mouth 2 (two) times daily. 04/17/23   Antonieta Iba, MD  sildenafil (VIAGRA) 100 MG tablet Take 1 tablet (100 mg total) by mouth as needed for erectile dysfunction. TAKE 1 (ONE) TABLET AS NEEDED. DO NOT USE AT SAME TIME AS NITRO 05/01/22   Patwardhan, Manish J, MD  spironolactone (ALDACTONE) 25 MG tablet Take 1 tablet (25 mg total) by mouth daily. 04/17/23   Patwardhan, Anabel Bene, MD  DULoxetine (CYMBALTA) 20 MG capsule Take 1 capsule (20 mg total) by mouth 2 times daily. 11/23/21 01/24/22       Allergies:   No Known Allergies  Social  History:   Social History   Socioeconomic History   Marital status: Married    Spouse name: Not on file   Number of children: 2   Years of education: Not on file   Highest education level: Not on file  Occupational History   Not on file  Tobacco Use   Smoking status: Former    Packs/day: 0.50    Years: 20.00    Additional pack years: 0.00    Total pack years: 10.00    Types: Cigarettes    Quit date: 07/13/2017    Years since quitting: 5.9   Smokeless tobacco: Never   Tobacco comments:    quit 2012  Vaping Use   Vaping Use: Never used  Substance and Sexual Activity   Alcohol use: Yes    Comment: occ   Drug use: Not Currently   Sexual activity: Yes  Other Topics Concern   Not on file  Social History Narrative   Lives with fiance, working FT , disesel equip, right handed, 2 kids, college edu   Social Determinants of Health   Financial Resource Strain: Not on file  Food Insecurity: No Food Insecurity (06/22/2023)   Hunger Vital Sign    Worried About Running Out  of Food in the Last Year: Never true    Ran Out of Food in the Last Year: Never true  Transportation Needs: No Transportation Needs (06/22/2023)   PRAPARE - Administrator, Civil Service (Medical): No    Lack of Transportation (Non-Medical): No  Physical Activity: Sufficiently Active (05/23/2023)   Exercise Vital Sign    Days of Exercise per Week: 3 days    Minutes of Exercise per Session: 60 min  Stress: Stress Concern Present (05/23/2023)   [employer] of Occupational Health - Occupational Stress Questionnaire    Feeling of Stress : To some extent  Social Connections: Not on file  Intimate Partner Violence: Not At Risk (06/22/2023)   Humiliation, Afraid, Rape, and Kick questionnaire    Fear of Current or Ex-Partner: No    Emotionally Abused: No    Physically Abused: No    Sexually Abused: No    Family History:   The patient's family history includes Heart disease in his paternal grandmother; Heart disease (age of onset: 33) in his father; Hypertension in his father; Valvular heart disease in his mother.    ROS:  Please see the history of present illness.  All other ROS reviewed and negative.     Physical Exam/Data:   Vitals:   06/22/23 1505  BP: 102/78  Pulse: (!) 59  Resp: 20  Temp: 98.3 F (36.8 C)  TempSrc: Oral  SpO2: 97%  Weight: 97.5 kg  Height: 6\' 1"  (1.854 m)   No intake or output data in the 24 hours ending 06/22/23 1538    06/22/2023    3:05 PM 06/22/2023    6:49 AM 06/20/2023    1:04 PM  Last 3 Weights  Weight (lbs) 215 lb 215 lb 215 lb 3.2 oz  Weight (kg) 97.523 kg 97.523 kg 97.614 kg     Body mass index is 28.37 kg/m.  General:  Well nourished, well developed, in no acute distress HEENT: normal Neck: no JVD Vascular: No carotid bruits; Distal pulses 2+ bilaterally   Cardiac:  normal S1, S2; RRR; no murmur  Lungs:  clear to auscultation bilaterally, no wheezing, rhonchi or rales  Abd: soft, nontender, no hepatomegaly  Ext: no  edema Musculoskeletal:  No  deformities, BUE and BLE strength normal and equal Skin: warm and dry  Neuro:  CNs 2-12 intact, no focal abnormalities noted Psych:  Normal affect    EKG:  The ECG that was done 06/22/23 was personally reviewed and demonstrates normal sinus rhythm, 60 bpm, T wave inversions in inferolateral leads  Relevant CV Studies:  Right and left Heart cath 06/22/23 Conclusions: Significant two-vessel coronary artery disease, including 90% ostial LCx stenosis (ostium appears jailed by proximal LAD stent extending back into the distal LMCA) and chronic total occlusion of proximal RCA stent. Widely patent ostial/proximal LAD stent. Severely reduced left ventricular systolic function (LVEF 25-35%). Upper normal to mildly elevated left and right heart filling pressures (LVEDP 16 mmHg, PCWP 16 mm Hg, mean RA 7). Low normal to mildly reduced cardiac output/index (Fick CO 5.1 L/min, CI 2.3 L/min/m^2). Angiographically normal abdominal aorta, iliac arteries, and right common femoral artery.   Recommendations: Transfer to [name] for cardiac surgery consultation.  I would favor bypass of OM1 +/- OM3 and distal RCA branches.  PCI would be high risk and would require bifurcation stenting from LMCA into OM1 with hemodynamic support if the patient is not a candidate for bypass. Start heparin infusion 2 hours after TR band removal. Consider consultation with advanced heart failure team when the patient reaches [name] to continue optimization of HFrEF in anticipation of revascularization. Aggressive secondary prevention of coronary artery disease.   Yvonne Kendall, MD [name]   Antiplatelet/Anticoag Continue aspirin 81 mg daily.  Start heparin infusion 2 hours after TR band removal in the setting of paroxysmal atrial fibrillation and severe ostial LCx disease.  Defer P2Y12 inhibitor and DOAC pending cardiac surgery consultation.  Discharge Date Anticipated discharge date to  be determined. Plan transfer to [name] for cardiac surgery consultation and CABG versus high-risk PCI.    Coronary Diagrams   Diagnostic Dominance: Right    Peripheral Vascular Diagram   Diagnostic    _____________   Echo 06/22/23 1. Left ventricular ejection fraction, by estimation, is 25 to 30%. The  left ventricle has severely decreased function. The left ventricle  demonstrates regional wall motion abnormalities (see scoring  diagram/findings for description). There is mild left   ventricular hypertrophy. Left ventricular diastolic parameters are  consistent with Grade I diastolic dysfunction (impaired relaxation). There  is akinesis of the left ventricular, entire anterior wall and apical  segment.   2. Right ventricular systolic function is mildly reduced. The right  ventricular size is mildly enlarged. Tricuspid regurgitation signal is  inadequate for assessing PA pressure.   3. The mitral valve was not well visualized. No evidence of mitral valve  regurgitation. No evidence of mitral stenosis.   4. The aortic valve was not well visualized. Aortic valve regurgitation  is not visualized. No aortic stenosis is present.    Echocardiogram 07/16/2020:  Poor visualization of endocardial borders.  Left ventricle cavity is  normal in size and thickness. Anterior/anteroseptal akinesis. LVEF  probably 40-45%.  Indeterminate diastolic filling pattern.  No significant valvular abnormalities.  normal right atrial pressure.  No significant change compared to previous study in 2018.  Laboratory Data:  High Sensitivity Troponin:   Recent Labs  Lab 06/15/23 0641 06/15/23 0954  TROPONINIHS 22* 97*      Chemistry Recent Labs  Lab 06/22/23 0835 06/22/23 0842  NA 137 138  K 4.0 4.1    No results for input(s): "PROT", "ALBUMIN", "AST", "ALT", "ALKPHOS", "BILITOT" in the last 168 hours. Lipids  No results for input(s): "CHOL", "TRIG", "HDL", "LABVLDL", "LDLCALC", "CHOLHDL"  in the last 168 hours. Hematology Recent Labs  Lab 06/22/23 0835 06/22/23 0842  HGB 15.0 15.3  HCT 44.0 45.0   Thyroid No results for input(s): "TSH", "FREET4" in the last 168 hours. BNPNo results for input(s): "BNP", "PROBNP" in the last 168 hours.  DDimer No results for input(s): "DDIMER" in the last 168 hours.   Radiology/Studies:  ECHOCARDIOGRAM COMPLETE  Result Date: 06/22/2023    ECHOCARDIOGRAM REPORT   Patient Name:   CURRY CONK Date of Exam: 06/22/2023 Medical Rec #:  [id number]   Height:       73.0 in Accession #:    [id number]  Weight:       215.0 lb Date of Birth:  Oct 15, 1971  BSA:          2.219 m Patient Age:    51 years    BP:           100/79 mmHg Patient Gender: M           HR:           63 bpm. Exam Location:  [facility] Procedure: 2D Echo, Cardiac Doppler, Color Doppler and Intracardiac            Opacification Agent Indications:     Acute ischemic heart disease, unspecified I24.9  History:         Patient has prior history of Echocardiogram examinations, most                  recent 07/14/2017. CHF; Previous Myocardial Infarction.  Sonographer:     Cristela Blue Referring Phys:  [id number] [address] Obeirne Diagnosing Phys: Yvonne Kendall MD  Sonographer Comments: Suboptimal parasternal window and suboptimal apical window. IMPRESSIONS  1. Left ventricular ejection fraction, by estimation, is 25 to 30%. The left ventricle has severely decreased function. The left ventricle demonstrates regional wall motion abnormalities (see scoring diagram/findings for description). There is mild left  ventricular hypertrophy. Left ventricular diastolic parameters are consistent with Grade I diastolic dysfunction (impaired relaxation). There is akinesis of the left ventricular, entire anterior wall and apical segment.  2. Right ventricular systolic function is mildly reduced. The right ventricular size is mildly enlarged. Tricuspid regurgitation signal is inadequate for assessing PA pressure.  3. The mitral valve  was not well visualized. No evidence of mitral valve regurgitation. No evidence of mitral stenosis.  4. The aortic valve was not well visualized. Aortic valve regurgitation is not visualized. No aortic stenosis is present. FINDINGS  Left Ventricle: Left ventricular ejection fraction, by estimation, is 25 to 30%. The left ventricle has severely decreased function. The left ventricle demonstrates regional wall motion abnormalities. Definity contrast agent was given IV to delineate the left ventricular endocardial borders. The left ventricular internal cavity size was normal in size. There is mild left ventricular hypertrophy. Left ventricular diastolic parameters are consistent with Grade I diastolic dysfunction (impaired relaxation). Right Ventricle: The right ventricular size is mildly enlarged. No increase in right ventricular wall thickness. Right ventricular systolic function is mildly reduced. Tricuspid regurgitation signal is inadequate for assessing PA pressure. Left Atrium: Left atrial size was normal in size. Right Atrium: Right atrial size was normal in size. Pericardium: There is no evidence of pericardial effusion. Mitral Valve: The mitral valve was not well visualized. No evidence of mitral valve regurgitation. No evidence of mitral valve stenosis. MV peak gradient, 2.4 mmHg. The mean mitral valve gradient is 1.0 mmHg. Tricuspid Valve:  The tricuspid valve is not well visualized. Tricuspid valve regurgitation is trivial. Aortic Valve: The aortic valve was not well visualized. Aortic valve regurgitation is not visualized. No aortic stenosis is present. Aortic valve mean gradient measures 2.0 mmHg. Aortic valve peak gradient measures 3.4 mmHg. Aortic valve area, by VTI measures 2.96 cm. Pulmonic Valve: The pulmonic valve was not well visualized. Pulmonic valve regurgitation is not visualized. No evidence of pulmonic stenosis. Aorta: The aortic root is normal in size and structure. Pulmonary Artery: The  pulmonary artery is not well seen. Venous: The inferior vena cava was not well visualized. IAS/Shunts: The interatrial septum was not well visualized.  LEFT VENTRICLE PLAX 2D LVIDd:         4.70 cm      Diastology LVIDs:         4.00 cm      LV e' medial:    5.44 cm/s LV PW:         1.10 cm      LV E/e' medial:  11.9 LV IVS:        1.10 cm      LV e' lateral:   7.51 cm/s LVOT diam:     2.00 cm      LV E/e' lateral: 8.7 LV SV:         47 LV SV Index:   21 LVOT Area:     3.14 cm  LV Volumes (MOD) LV vol d, MOD A2C: 86.1 ml LV vol d, MOD A4C: 150.0 ml LV vol s, MOD A2C: 74.1 ml LV vol s, MOD A4C: 106.0 ml LV SV MOD A2C:     12.0 ml LV SV MOD A4C:     150.0 ml LV SV MOD BP:      22.5 ml RIGHT VENTRICLE RV Basal diam:  4.90 cm RV Mid diam:    3.60 cm RV S prime:     10.80 cm/s TAPSE (M-mode): 1.4 cm LEFT ATRIUM           Index        RIGHT ATRIUM           Index LA diam:      3.30 cm 1.49 cm/m   RA Area:     17.00 cm LA Vol (A2C): 53.4 ml 24.07 ml/m  RA Volume:   49.00 ml  22.08 ml/m LA Vol (A4C): 25.2 ml 11.36 ml/m  AORTIC VALVE AV Area (Vmax):    2.63 cm AV Area (Vmean):   2.72 cm AV Area (VTI):     2.96 cm AV Vmax:           91.60 cm/s AV Vmean:          60.300 cm/s AV VTI:            0.160 m AV Peak Grad:      3.4 mmHg AV Mean Grad:      2.0 mmHg LVOT Vmax:         76.60 cm/s LVOT Vmean:        52.300 cm/s LVOT VTI:          0.151 m LVOT/AV VTI ratio: 0.94  AORTA Ao Root diam: 2.60 cm MITRAL VALVE MV Area (PHT): 3.31 cm    SHUNTS MV Area VTI:   2.08 cm    Systemic VTI:  0.15 m MV Peak grad:  2.4 mmHg    Systemic Diam: 2.00 cm MV Mean grad:  1.0 mmHg MV Vmax:  0.78 m/s MV Vmean:      43.1 cm/s MV Decel Time: 229 msec MV E velocity: 65.00 cm/s MV A velocity: 79.10 cm/s MV E/A ratio:  0.82 Mannix Kroeker MD Electronically signed by Yvonne Kendall MD Signature Date/Time: 06/22/2023/1:05:21 PM    Final    CARDIAC CATHETERIZATION  Result Date: 06/22/2023 Conclusions: Significant two-vessel coronary  artery disease, including 90% ostial LCx stenosis (ostium appears jailed by proximal LAD stent extending back into the distal LMCA) and chronic total occlusion of proximal RCA stent. Widely patent ostial/proximal LAD stent. Severely reduced left ventricular systolic function (LVEF 25-35%). Upper normal to mildly elevated left and right heart filling pressures (LVEDP 16 mmHg, PCWP 16 mm Hg, mean RA 7). Low normal to mildly reduced cardiac output/index (Fick CO 5.1 L/min, CI 2.3 L/min/m^2). Angiographically normal abdominal aorta, iliac arteries, and right common femoral artery. Recommendations: Transfer to [name] for cardiac surgery consultation.  I would favor bypass of OM1 +/- OM3 and distal RCA branches.  PCI would be high risk and would require bifurcation stenting from LMCA into OM1 with hemodynamic support if the patient is not a candidate for bypass. Start heparin infusion 2 hours after TR band removal. Consider consultation with advanced heart failure team when the patient reaches [name] to continue optimization of HFrEF in anticipation of revascularization. Aggressive secondary prevention of coronary artery disease. Yvonne Kendall, MD [name]  PERIPHERAL VASCULAR CATHETERIZATION  Result Date: 06/22/2023 Conclusions: Significant two-vessel coronary artery disease, including 90% ostial LCx stenosis (ostium appears jailed by proximal LAD stent extending back into the distal LMCA) and chronic total occlusion of proximal RCA stent. Widely patent ostial/proximal LAD stent. Severely reduced left ventricular systolic function (LVEF 25-35%). Upper normal to mildly elevated left and right heart filling pressures (LVEDP 16 mmHg, PCWP 16 mm Hg, mean RA 7). Low normal to mildly reduced cardiac output/index (Fick CO 5.1 L/min, CI 2.3 L/min/m^2). Angiographically normal abdominal aorta, iliac arteries, and right common femoral artery. Recommendations: Transfer to [name] for cardiac surgery  consultation.  I would favor bypass of OM1 +/- OM3 and distal RCA branches.  PCI would be high risk and would require bifurcation stenting from LMCA into OM1 with hemodynamic support if the patient is not a candidate for bypass. Start heparin infusion 2 hours after TR band removal. Consider consultation with advanced heart failure team when the patient reaches [name] to continue optimization of HFrEF in anticipation of revascularization. Aggressive secondary prevention of coronary artery disease. Yvonne Kendall, MD [name]    Assessment and Plan:   Unstable Angina CAD with prior stenting (PCI x2 in 2003 and LAD in 2018) - LHC 6/28 showed significant 2V CAD, including 90% ostial LCx stenosis (ostium appears jailed by proximal LAD stent extending back into the distal LMCA) and chronic total occlusion of proximal RCA stent, widely patent ostial/proximal LAD stent, LVEF reduced 25-35% - Echo showed LVEF 25-30% - plan to transfer to [facility] for CABG consult vs high risk PCI - restart IV heparin 2 hours after TR band removal - hold Eliquis and P2Y12i for possible surgery - Continue Aspirin 81mg  daily. Coreg 3.125mg  BID, Lipitor 20mg  daily, Repatha, and Zetia.  HFrEF ICM - Echo this admission showed LVEF 25-30%, mild LVH, G1DD, mildly reduced RV function - Prior EF 40-45% in 2021 - continue PTA Entresto24-26mg BID, spiro 25mg  daily, Farxiga 10mg  daily, and Coreg 3.125mg BID - mildly elevated left and right heart filling pressures - plan to consult heart failure team [facility] at  [facility]  [facility] - continue Coreg 3.125mg BID, Entresto 24-26mg BID, spiro 25mg  daily, Farxiga 10mg  daily - Bps soft at times, continue to monitor  HLD - LDL 18 - continue Repatha, Lipitor 20mg  daily and Zetia 10mg  daily   Hypothyroidism - continue synthroid  Paroxsymal Afib - diagnosed 05/2023 in the ER started on Eliquis. Spontaneously converted in the ER - remains in NSR - IV heparin in place of Eliquis for possible  surgery - continue low dose Coreg. He is also on Ivabradine 5mg  BID   Severity of Illness: The appropriate patient status for this patient is INPATIENT. Inpatient status is judged to be reasonable and necessary in order to provide the required intensity of service to ensure the patient's safety. The patient's presenting symptoms, physical exam findings, and initial radiographic and laboratory data in the context of their chronic comorbidities is felt to place them at high risk for further clinical deterioration. Furthermore, it is not anticipated that the patient will be medically stable for discharge from the hospital within 2 midnights of admission.   * I certify that at the point of admission it is my clinical judgment that the patient will require inpatient hospital care spanning beyond 2 midnights from the point of admission due to high intensity of service, high risk for further deterioration and high frequency of surveillance required.*   For questions or updates, please contact [facility] Please consult [email or web address] for contact info under     Signed, Cadence David Stall, PA-C  06/22/2023 2:01 PM    I have independently seen and examined the patient and agree with the findings and plan, as documented in the PA/NP's note, with the following additions/changes.  Mr. Fladger is a 52 year old male with history of CAD status post PCI to the ostial/proximal LAD in the setting of STEMI complicated by cardiogenic shock necessitating Impella placement in 06/2017 as well as remote PCI to the RCA with known chronic total occlusion on subsequent caths, ischemic cardiomyopathy with LVEF of 40-45%, hyperlipidemia, and GERD, transferred from [facility] due to unstable angina and severe two-vessel coronary artery disease and declining LVEF.  Mr. Ziemba notes episodic chest pain over the last several months, complaining mostly of waking up around 3 AM with vague discomfort in his chest.  However,  he developed severe chest pain about a week ago reminiscent of his prior heart attacks and wound up in the ED at [facility].  He was found to be in atrial fibrillation with rapid ventricular response and converted to sinus rhythm with diltiazem.  He was noted to have mild troponin elevation 22->97 and was discharged on apixaban and carvedilol.  Saw Dr. Mariah Milling in follow-up last week; there was concern about progressive coronary artery disease, prompting referral for cardiac catheterization today.  This revealed patent LAD stent extending back into the distal LMCA.  The jailed LCx demonstrated a 90% ostial stenosis and covers a sizable territory with a large first obtuse marginal branch.  LVEF was 25-35% with upper normal to mildly elevated left and right heart filling pressures.  He has been transferred to [facility] for further evaluation for CABG versus high risk PCI.  Physical exam is notable for regular rate and rhythm without murmurs.  Lungs are clear.  Abdomen is soft and nontender.  There is no lower extremity edema or JVD.  Echocardiogram today confirmed LVEF 25-30% though images were not optimal.  LAD territory appears akinetic.  No significant valvular disease was identified.  In summary, Mr. Shere is a  52 year old man with history of extensive CAD, chronic HFrEF, and recently diagnosed paroxysmal atrial fibrillation, presenting for evaluation of worsening cardiomyopathy and unstable angina.  I have reviewed his catheterization films with Drs. Kirke Corin and [employer].  We agree that the anatomy is not ideal for percutaneous intervention as stenting would need to extend from the LMCA into OM1, completing a culotte procedure with the previously placed ostial LAD stent.  Long-term patency would be somewhat diminished with this technique..  In the setting of severely reduced LVEF and chronic total occlusion of the right, hemodynamic support would be necessary as well during any such intervention.  Fortunately, his  aortoiliac and right common femoral artery are normal and would likely accommodate an Impella.  CABG may be a better long-term option for Mr. Manecke given his relatively young age.  The first OM branch is quite large and supplies much of the lateral wall.  Additionally, it may be possible to revascularize the distal RCA territory.  In the meantime, we will continue aspirin and IV heparin.  We will continue his goal-directed medical therapy consisting of carvedilol, Entresto, spironolactone, ivabradine, and dapagliflozin.  His soft blood pressure will make it challenging to escalate therapy further.  I have asked the advanced heart failure team to assist with medical optimization before Mr. Elvidge undergoes any sort of revascularization.  Yvonne Kendall, MD [facility]

## 2023-06-22 NOTE — Progress Notes (Signed)
Rounding Note    Patient Name: Bobby [name] Date of Encounter: 06/22/2023  [facility] Cardiologist: Julien Nordmann, MD ***  Subjective   ***  Inpatient Medications    Scheduled Meds:  [START ON 06/25/2023] epinephrine  0-10 mcg/min Intravenous To OR   [START ON 06/25/2023] heparin sodium (porcine) 2,500 Units, papaverine 30 mg in electrolyte-A (PLASMALYTE-A PH 7.4) 500 mL irrigation   Irrigation To OR   [START ON 06/25/2023] insulin   Intravenous To OR   [START ON 06/25/2023] magnesium sulfate  40 mEq Other To OR   [START ON 06/25/2023] phenylephrine  30-200 mcg/min Intravenous To OR   [START ON 06/25/2023] potassium chloride  80 mEq Other To OR   [START ON 06/25/2023] tranexamic acid  15 mg/kg Intravenous To OR   [START ON 06/25/2023] tranexamic acid  2 mg/kg Intracatheter To OR   Continuous Infusions:  [START ON 06/25/2023]  ceFAZolin (ANCEF) IV     [START ON 06/25/2023]  ceFAZolin (ANCEF) IV     [START ON 06/25/2023] dexmedetomidine     [START ON 06/25/2023] heparin 30,000 units/NS 1000 mL solution for CELLSAVER     heparin 1,400 Units/hr (06/22/23 1911)   [START ON 06/25/2023] milrinone     [START ON 06/25/2023] nitroGLYCERIN     [START ON 06/25/2023] norepinephrine     [START ON 06/25/2023] tranexamic acid (CYKLOKAPRON) 2,500 mg in sodium chloride 0.9 % 250 mL (10 mg/mL) infusion     [START ON 06/25/2023] vancomycin     PRN Meds:    Vital Signs    Vitals:   06/22/23 1505  BP: 102/78  Pulse: (!) 59  Resp: 20  Temp: 98.3 F (36.8 C)  TempSrc: Oral  SpO2: 97%  Weight: 97.5 kg  Height: 6\' 1"  (1.854 m)    Intake/Output Summary (Last 24 hours) at 06/22/2023 1953 Last data filed at 06/22/2023 1736 Gross per 24 hour  Intake 28.3 ml  Output --  Net 28.3 ml      06/22/2023    3:05 PM 06/22/2023    6:49 AM 06/20/2023    1:04 PM  Last 3 Weights  Weight (lbs) 215 lb 215 lb 215 lb 3.2 oz  Weight (kg) 97.523 kg 97.523 kg 97.614 kg      Telemetry    *** - Personally  Reviewed  ECG    *** - Personally Reviewed  Physical Exam  *** GEN: No acute distress.   Neck: No JVD Cardiac: RRR, no murmurs, rubs, or gallops.  Respiratory: Clear to auscultation bilaterally. GI: Soft, nontender, non-distended  MS: No edema; No deformity. Neuro:  Nonfocal  Psych: Normal affect   Labs    High Sensitivity Troponin:   Recent Labs  Lab 06/15/23 0641 06/15/23 0954  TROPONINIHS 22* 97*     Chemistry Recent Labs  Lab 06/22/23 0835 06/22/23 0842  NA 137 138  K 4.0 4.1    Lipids No results for input(s): "CHOL", "TRIG", "HDL", "LABVLDL", "LDLCALC", "CHOLHDL" in the last 168 hours.  Hematology Recent Labs  Lab 06/22/23 0835 06/22/23 0842  HGB 15.0 15.3  HCT 44.0 45.0   Thyroid No results for input(s): "TSH", "FREET4" in the last 168 hours.  BNPNo results for input(s): "BNP", "PROBNP" in the last 168 hours.  DDimer No results for input(s): "DDIMER" in the last 168 hours.   Radiology    ECHOCARDIOGRAM COMPLETE  Result Date: 06/22/2023    ECHOCARDIOGRAM REPORT   Patient Name:   Bobby [name] Date of Exam: 06/22/2023  Medical Rec #:  [id number]   Height:       73.0 in Accession #:    [id number]  Weight:       215.0 lb Date of Birth:  July 24, 1971  BSA:          2.219 m Patient Age:    52 years    BP:           100/79 mmHg Patient Gender: M           HR:           63 bpm. Exam Location:  [facility] Procedure: 2D Echo, Cardiac Doppler, Color Doppler and Intracardiac            Opacification Agent Indications:     Acute ischemic heart disease, unspecified I24.9  History:         Patient has prior history of Echocardiogram examinations, most                  recent 07/14/2017. CHF; Previous Myocardial Infarction.  Sonographer:     Cristela Blue Referring Phys:  [id number] CHRISTOPHER END Diagnosing Phys: Yvonne Kendall MD  Sonographer Comments: Suboptimal parasternal window and suboptimal apical window. IMPRESSIONS  1. Left ventricular ejection fraction, by estimation, is 25 to 30%.  The left ventricle has severely decreased function. The left ventricle demonstrates regional wall motion abnormalities (see scoring diagram/findings for description). There is mild left  ventricular hypertrophy. Left ventricular diastolic parameters are consistent with Grade I diastolic dysfunction (impaired relaxation). There is akinesis of the left ventricular, entire anterior wall and apical segment.  2. Right ventricular systolic function is mildly reduced. The right ventricular size is mildly enlarged. Tricuspid regurgitation signal is inadequate for assessing PA pressure.  3. The mitral valve was not well visualized. No evidence of mitral valve regurgitation. No evidence of mitral stenosis.  4. The aortic valve was not well visualized. Aortic valve regurgitation is not visualized. No aortic stenosis is present. FINDINGS  Left Ventricle: Left ventricular ejection fraction, by estimation, is 25 to 30%. The left ventricle has severely decreased function. The left ventricle demonstrates regional wall motion abnormalities. Definity contrast agent was given IV to delineate the left ventricular endocardial borders. The left ventricular internal cavity size was normal in size. There is mild left ventricular hypertrophy. Left ventricular diastolic parameters are consistent with Grade I diastolic dysfunction (impaired relaxation). Right Ventricle: The right ventricular size is mildly enlarged. No increase in right ventricular wall thickness. Right ventricular systolic function is mildly reduced. Tricuspid regurgitation signal is inadequate for assessing PA pressure. Left Atrium: Left atrial size was normal in size. Right Atrium: Right atrial size was normal in size. Pericardium: There is no evidence of pericardial effusion. Mitral Valve: The mitral valve was not well visualized. No evidence of mitral valve regurgitation. No evidence of mitral valve stenosis. MV peak gradient, 2.4 mmHg. The mean mitral valve gradient is  1.0 mmHg. Tricuspid Valve: The tricuspid valve is not well visualized. Tricuspid valve regurgitation is trivial. Aortic Valve: The aortic valve was not well visualized. Aortic valve regurgitation is not visualized. No aortic stenosis is present. Aortic valve mean gradient measures 2.0 mmHg. Aortic valve peak gradient measures 3.4 mmHg. Aortic valve area, by VTI measures 2.96 cm. Pulmonic Valve: The pulmonic valve was not well visualized. Pulmonic valve regurgitation is not visualized. No evidence of pulmonic stenosis. Aorta: The aortic root is normal in size and structure. Pulmonary Artery: The pulmonary artery is not well seen. Venous: The inferior  vena cava was not well visualized. IAS/Shunts: The interatrial septum was not well visualized.  LEFT VENTRICLE PLAX 2D LVIDd:         4.70 cm      Diastology LVIDs:         4.00 cm      LV e' medial:    5.44 cm/s LV PW:         1.10 cm      LV E/e' medial:  11.9 LV IVS:        1.10 cm      LV e' lateral:   7.51 cm/s LVOT diam:     2.00 cm      LV E/e' lateral: 8.7 LV SV:         47 LV SV Index:   21 LVOT Area:     3.14 cm  LV Volumes (MOD) LV vol d, MOD A2C: 86.1 ml LV vol d, MOD A4C: 150.0 ml LV vol s, MOD A2C: 74.1 ml LV vol s, MOD A4C: 106.0 ml LV SV MOD A2C:     12.0 ml LV SV MOD A4C:     150.0 ml LV SV MOD BP:      22.5 ml RIGHT VENTRICLE RV Basal diam:  4.90 cm RV Mid diam:    3.60 cm RV S prime:     10.80 cm/s TAPSE (M-mode): 1.4 cm LEFT ATRIUM           Index        RIGHT ATRIUM           Index LA diam:      3.30 cm 1.49 cm/m   RA Area:     17.00 cm LA Vol (A2C): 53.4 ml 24.07 ml/m  RA Volume:   49.00 ml  22.08 ml/m LA Vol (A4C): 25.2 ml 11.36 ml/m  AORTIC VALVE AV Area (Vmax):    2.63 cm AV Area (Vmean):   2.72 cm AV Area (VTI):     2.96 cm AV Vmax:           91.60 cm/s AV Vmean:          60.300 cm/s AV VTI:            0.160 m AV Peak Grad:      3.4 mmHg AV Mean Grad:      2.0 mmHg LVOT Vmax:         76.60 cm/s LVOT Vmean:        52.300 cm/s LVOT VTI:           0.151 m LVOT/AV VTI ratio: 0.94  AORTA Ao Root diam: 2.60 cm MITRAL VALVE MV Area (PHT): 3.31 cm    SHUNTS MV Area VTI:   2.08 cm    Systemic VTI:  0.15 m MV Peak grad:  2.4 mmHg    Systemic Diam: 2.00 cm MV Mean grad:  1.0 mmHg MV Vmax:       0.78 m/s MV Vmean:      43.1 cm/s MV Decel Time: 229 msec MV E velocity: 65.00 cm/s MV A velocity: 79.10 cm/s MV E/A ratio:  0.82 Christopher End MD Electronically signed by Yvonne Kendall MD Signature Date/Time: 06/22/2023/1:05:21 PM    Final    CARDIAC CATHETERIZATION  Result Date: 06/22/2023 Conclusions: Significant two-vessel coronary artery disease, including 90% ostial LCx stenosis (ostium appears jailed by proximal LAD stent extending back into the distal LMCA) and chronic total occlusion of proximal RCA stent. Widely patent ostial/proximal LAD  stent. Severely reduced left ventricular systolic function (LVEF 25-35%). Upper normal to mildly elevated left and right heart filling pressures (LVEDP 16 mmHg, PCWP 16 mm Hg, mean RA 7). Low normal to mildly reduced cardiac output/index (Fick CO 5.1 L/min, CI 2.3 L/min/m^2). Angiographically normal abdominal aorta, iliac arteries, and right common femoral artery. Recommendations: Transfer to [name] for cardiac surgery consultation.  I would favor bypass of OM1 +/- OM3 and distal RCA branches.  PCI would be high risk and would require bifurcation stenting from LMCA into OM1 with hemodynamic support if the patient is not a candidate for bypass. Start heparin infusion 2 hours after TR band removal. Consider consultation with advanced heart failure team when the patient reaches [name] to continue optimization of HFrEF in anticipation of revascularization. Aggressive secondary prevention of coronary artery disease. Yvonne Kendall, MD [name]  PERIPHERAL VASCULAR CATHETERIZATION  Result Date: 06/22/2023 Conclusions: Significant two-vessel coronary artery disease, including 90% ostial LCx stenosis  (ostium appears jailed by proximal LAD stent extending back into the distal LMCA) and chronic total occlusion of proximal RCA stent. Widely patent ostial/proximal LAD stent. Severely reduced left ventricular systolic function (LVEF 25-35%). Upper normal to mildly elevated left and right heart filling pressures (LVEDP 16 mmHg, PCWP 16 mm Hg, mean RA 7). Low normal to mildly reduced cardiac output/index (Fick CO 5.1 L/min, CI 2.3 L/min/m^2). Angiographically normal abdominal aorta, iliac arteries, and right common femoral artery. Recommendations: Transfer to [name] for cardiac surgery consultation.  I would favor bypass of OM1 +/- OM3 and distal RCA branches.  PCI would be high risk and would require bifurcation stenting from LMCA into OM1 with hemodynamic support if the patient is not a candidate for bypass. Start heparin infusion 2 hours after TR band removal. Consider consultation with advanced heart failure team when the patient reaches [name] to continue optimization of HFrEF in anticipation of revascularization. Aggressive secondary prevention of coronary artery disease. Yvonne Kendall, MD [name]   Cardiac Studies   Cardiac Studies & Procedures   CARDIAC CATHETERIZATION  CARDIAC CATHETERIZATION 06/22/2023  Narrative Conclusions: Significant two-vessel coronary artery disease, including 90% ostial LCx stenosis (ostium appears jailed by proximal LAD stent extending back into the distal LMCA) and chronic total occlusion of proximal RCA stent. Widely patent ostial/proximal LAD stent. Severely reduced left ventricular systolic function (LVEF 25-35%). Upper normal to mildly elevated left and right heart filling pressures (LVEDP 16 mmHg, PCWP 16 mm Hg, mean RA 7). Low normal to mildly reduced cardiac output/index (Fick CO 5.1 L/min, CI 2.3 L/min/m^2). Angiographically normal abdominal aorta, iliac arteries, and right common femoral artery.  Recommendations: Transfer to [name] for  cardiac surgery consultation.  I would favor bypass of OM1 +/- OM3 and distal RCA branches.  PCI would be high risk and would require bifurcation stenting from LMCA into OM1 with hemodynamic support if the patient is not a candidate for bypass. Start heparin infusion 2 hours after TR band removal. Consider consultation with advanced heart failure team when the patient reaches [name] to continue optimization of HFrEF in anticipation of revascularization. Aggressive secondary prevention of coronary artery disease.  Yvonne Kendall, MD [name]  Findings Coronary Findings Diagnostic  Dominance: Right  Left Main Vessel is large.  Left Anterior Descending Vessel is large. Non-stenotic Ost LAD to Prox LAD lesion was previously treated.  First Diagonal Branch Vessel is moderate in size. Ost 1st Diag lesion is 25% stenosed.  Ramus Intermedius Ost Ramus lesion is 50%  stenosed.  Left Circumflex Vessel is large. Ost Cx lesion is 90% stenosed. Vessel is the culprit lesion.  First Obtuse Marginal Branch Vessel is large in size. 1st Mrg lesion is 25% stenosed.  Second Obtuse Marginal Branch Vessel is small in size.  Third Obtuse Marginal Branch Vessel is moderate in size.  Right Coronary Artery Collaterals Mid RCA filled by collaterals from Dist LAD.  Collaterals Mid RCA filled by collaterals from Dist Cx.  Prox RCA lesion is 100% stenosed. The lesion was previously treated . Known CTO. RCA not engaged today  Intervention  No interventions have been documented.   CARDIAC CATHETERIZATION  CARDIAC CATHETERIZATION 08/10/2020  Narrative LM: Normal LAD: Patent ostial LAD stent. 20% ostial diagonal stenosis LCx: 20% OM1 stenosis RCA: Prox in-stent CTO. Left-to-left collaterals fill up to mid RCA Normal LVEDP  Findings Coronary Findings Diagnostic  Dominance: Right  Left Anterior Descending Previously placed Ost LAD to Prox LAD stent (unknown type) is widely  patent.  First Diagonal Branch Ost 1st Diag lesion is 25% stenosed.  Ramus Intermedius Ost Ramus lesion is 50% stenosed.  Left Circumflex  Third Obtuse Marginal Branch Vessel is small in size.  Right Coronary Artery Collaterals Mid RCA filled by collaterals from Dist LAD.  Collaterals Mid RCA filled by collaterals from Dist Cx.  Prox RCA lesion is 100% stenosed. The lesion was previously treated. Known CTO. RCA not engaged today  Intervention  No interventions have been documented.   STRESS TESTS  NM MYOCAR MULTI W/SPECT W 08/02/2022  Narrative   Findings are consistent with prior myocardial infarction. The study is intermediate risk.   No ST deviation was noted.   LV perfusion is abnormal. There is no evidence of ischemia. There is evidence of infarction. Defect 1: There is a large defect with absent uptake present in the apical to mid anterior, anteroseptal and apex location(s) that is fixed. There is abnormal wall motion in the defect area. Consistent with infarction.   Left ventricular function is abnormal.  41%.  End diastolic cavity size is mildly enlarged. End systolic cavity size is mildly enlarged.   There is evidence of a large LAD infarct with minimal peri-infarct ischemia.   ECHOCARDIOGRAM  ECHOCARDIOGRAM COMPLETE 06/22/2023  Narrative ECHOCARDIOGRAM REPORT    Patient Name:   Bobby [name] Date of Exam: 06/22/2023 Medical Rec #:  [id number]   Height:       73.0 in Accession #:    [id number]  Weight:       215.0 lb Date of Birth:  04-Apr-1971  BSA:          2.219 m Patient Age:    52 years    BP:           100/79 mmHg Patient Gender: M           HR:           63 bpm. Exam Location:  [facility]  Procedure: 2D Echo, Cardiac Doppler, Color Doppler and Intracardiac Opacification Agent  Indications:     Acute ischemic heart disease, unspecified I24.9  History:         Patient has prior history of Echocardiogram examinations, most recent 07/14/2017. CHF; Previous  Myocardial Infarction.  Sonographer:     Cristela Blue Referring Phys:  [id number] CHRISTOPHER END Diagnosing Phys: Yvonne Kendall MD   Sonographer Comments: Suboptimal parasternal window and suboptimal apical window. IMPRESSIONS   1. Left ventricular ejection fraction, by estimation, is 25 to 30%. The left ventricle has severely  decreased function. The left ventricle demonstrates regional wall motion abnormalities (see scoring diagram/findings for description). There is mild left ventricular hypertrophy. Left ventricular diastolic parameters are consistent with Grade I diastolic dysfunction (impaired relaxation). There is akinesis of the left ventricular, entire anterior wall and apical segment. 2. Right ventricular systolic function is mildly reduced. The right ventricular size is mildly enlarged. Tricuspid regurgitation signal is inadequate for assessing PA pressure. 3. The mitral valve was not well visualized. No evidence of mitral valve regurgitation. No evidence of mitral stenosis. 4. The aortic valve was not well visualized. Aortic valve regurgitation is not visualized. No aortic stenosis is present.  FINDINGS Left Ventricle: Left ventricular ejection fraction, by estimation, is 25 to 30%. The left ventricle has severely decreased function. The left ventricle demonstrates regional wall motion abnormalities. Definity contrast agent was given IV to delineate the left ventricular endocardial borders. The left ventricular internal cavity size was normal in size. There is mild left ventricular hypertrophy. Left ventricular diastolic parameters are consistent with Grade I diastolic dysfunction (impaired relaxation).  Right Ventricle: The right ventricular size is mildly enlarged. No increase in right ventricular wall thickness. Right ventricular systolic function is mildly reduced. Tricuspid regurgitation signal is inadequate for assessing PA pressure.  Left Atrium: Left atrial size was normal in  size.  Right Atrium: Right atrial size was normal in size.  Pericardium: There is no evidence of pericardial effusion.  Mitral Valve: The mitral valve was not well visualized. No evidence of mitral valve regurgitation. No evidence of mitral valve stenosis. MV peak gradient, 2.4 mmHg. The mean mitral valve gradient is 1.0 mmHg.  Tricuspid Valve: The tricuspid valve is not well visualized. Tricuspid valve regurgitation is trivial.  Aortic Valve: The aortic valve was not well visualized. Aortic valve regurgitation is not visualized. No aortic stenosis is present. Aortic valve mean gradient measures 2.0 mmHg. Aortic valve peak gradient measures 3.4 mmHg. Aortic valve area, by VTI measures 2.96 cm.  Pulmonic Valve: The pulmonic valve was not well visualized. Pulmonic valve regurgitation is not visualized. No evidence of pulmonic stenosis.  Aorta: The aortic root is normal in size and structure.  Pulmonary Artery: The pulmonary artery is not well seen.  Venous: The inferior vena cava was not well visualized.  IAS/Shunts: The interatrial septum was not well visualized.   LEFT VENTRICLE PLAX 2D LVIDd:         4.70 cm      Diastology LVIDs:         4.00 cm      LV e' medial:    5.44 cm/s LV PW:         1.10 cm      LV E/e' medial:  11.9 LV IVS:        1.10 cm      LV e' lateral:   7.51 cm/s LVOT diam:     2.00 cm      LV E/e' lateral: 8.7 LV SV:         47 LV SV Index:   21 LVOT Area:     3.14 cm  LV Volumes (MOD) LV vol d, MOD A2C: 86.1 ml LV vol d, MOD A4C: 150.0 ml LV vol s, MOD A2C: 74.1 ml LV vol s, MOD A4C: 106.0 ml LV SV MOD A2C:     12.0 ml LV SV MOD A4C:     150.0 ml LV SV MOD BP:      22.5 ml  RIGHT VENTRICLE RV Basal diam:  4.90 cm RV Mid diam:    3.60 cm RV S prime:     10.80 cm/s TAPSE (M-mode): 1.4 cm  LEFT ATRIUM           Index        RIGHT ATRIUM           Index LA diam:      3.30 cm 1.49 cm/m   RA Area:     17.00 cm LA Vol (A2C): 53.4 ml 24.07 ml/m   RA Volume:   49.00 ml  22.08 ml/m LA Vol (A4C): 25.2 ml 11.36 ml/m AORTIC VALVE AV Area (Vmax):    2.63 cm AV Area (Vmean):   2.72 cm AV Area (VTI):     2.96 cm AV Vmax:           91.60 cm/s AV Vmean:          60.300 cm/s AV VTI:            0.160 m AV Peak Grad:      3.4 mmHg AV Mean Grad:      2.0 mmHg LVOT Vmax:         76.60 cm/s LVOT Vmean:        52.300 cm/s LVOT VTI:          0.151 m LVOT/AV VTI ratio: 0.94  AORTA Ao Root diam: 2.60 cm  MITRAL VALVE MV Area (PHT): 3.31 cm    SHUNTS MV Area VTI:   2.08 cm    Systemic VTI:  0.15 m MV Peak grad:  2.4 mmHg    Systemic Diam: 2.00 cm MV Mean grad:  1.0 mmHg MV Vmax:       0.78 m/s MV Vmean:      43.1 cm/s MV Decel Time: 229 msec MV E velocity: 65.00 cm/s MV A velocity: 79.10 cm/s MV E/A ratio:  0.82  Cristal Deer End MD Electronically signed by Yvonne Kendall MD Signature Date/Time: 06/22/2023/1:05:21 PM    Final    MONITORS  LONG TERM MONITOR-LIVE TELEMETRY (3-14 DAYS) 05/06/2020  Narrative Real-time Outpatient Cardiac Telemetry 04/01/2020 - 04/15/2020: Dominant rhythm: Sinus. HR 44-150 bpm. Avg HR 72 bpm. Supraventricular ectopy, PAC/upto 6 beat run. SVE burden <1% Ventricular ectopy, PVC, NSVT upto 5 beats. VE burden <1% No atrial fibrillation/atrial flutter/SVT/high grade AV block, sinus pause >3sec noted. 12 patient activated events do not correlate with arrhthymias.            Patient Profile     52 y.o. male  with history of CAD status post PCI to the ostial/proximal LAD in the setting of STEMI complicated by cardiogenic shock necessitating Impella placement in 06/2017 as well as remote PCI to the RCA with known chronic total occlusion on subsequent caths, ischemic cardiomyopathy with LVEF of 40-45%, hHLD and GERD, transferred from [facility] due to unstable angina and severe two-vessel coronary artery disease and declining LVEF now awaiting evaluation for possible CABG vs high risk PCI.    Assessment & Plan    #Unstable Angina: #Known CAD with prior PCI -LHC 6/28 with 2V CAD including 90% ostial LCx stenosis (ostium appears jailed by proximal LAD stent extending back into the distal LMCA) and chronic total occlusion of proximal RCA stent, widely patent ostial/proximal LAD stent  -LVEF reduced from 40-45%>25-30% -Now transferred to [facility] for high risk PCI vs CABG -Continue heparin gtt -Continue ASA 81mg  daily -Continue lipitor 20mg  daily -Continue zetia 10mg  daily -Continue coreg 3.125mg  BID -On repatha as outpatient  #  Chronic Systolic HF: #Ischemic CM -TTE with drop in EF from 40-45% to 25-30% in the setting of significant CAD as above -RHC/LHC with LVEDP 16, PCWP 16, mRAP 7, CO 5.1, CI 2.3 -Continue milrinone gtt -Holding GDMT while on inoptropic support; will add back as able  #HTN: -Holding meds for now given soft blood pressure  #Paroxysmal Afib: -Diagnosed in ER on 05/2023 and converted spontaneously on dilt -Currently holding apixaban now transitioned to heparin gtt -Coreg on hold while on inotropic agents  For questions or updates, please contact [facility] Please consult [email or web address] for contact info under        Signed, Meriam Sprague, MD  06/22/2023, 7:53 PM

## 2023-06-23 ENCOUNTER — Other Ambulatory Visit (HOSPITAL_COMMUNITY): Payer: 59

## 2023-06-23 ENCOUNTER — Inpatient Hospital Stay (HOSPITAL_COMMUNITY): Payer: BC Managed Care – PPO

## 2023-06-23 DIAGNOSIS — I200 Unstable angina: Secondary | ICD-10-CM | POA: Diagnosis not present

## 2023-06-23 DIAGNOSIS — I480 Paroxysmal atrial fibrillation: Secondary | ICD-10-CM | POA: Diagnosis not present

## 2023-06-23 DIAGNOSIS — I5022 Chronic systolic (congestive) heart failure: Secondary | ICD-10-CM | POA: Diagnosis not present

## 2023-06-23 LAB — URINALYSIS, ROUTINE W REFLEX MICROSCOPIC
Bilirubin Urine: NEGATIVE
Glucose, UA: 50 mg/dL — AB
Hgb urine dipstick: NEGATIVE
Ketones, ur: NEGATIVE mg/dL
Leukocytes,Ua: NEGATIVE
Nitrite: NEGATIVE
Protein, ur: NEGATIVE mg/dL
Specific Gravity, Urine: 1.013 (ref 1.005–1.030)
pH: 6 (ref 5.0–8.0)

## 2023-06-23 LAB — CBC
HCT: 47.7 % (ref 39.0–52.0)
Hemoglobin: 16.5 g/dL (ref 13.0–17.0)
MCH: 31.9 pg (ref 26.0–34.0)
MCHC: 34.6 g/dL (ref 30.0–36.0)
MCV: 92.3 fL (ref 80.0–100.0)
Platelets: 190 10*3/uL (ref 150–400)
RBC: 5.17 MIL/uL (ref 4.22–5.81)
RDW: 13.7 % (ref 11.5–15.5)
WBC: 8.5 10*3/uL (ref 4.0–10.5)
nRBC: 0 % (ref 0.0–0.2)

## 2023-06-23 LAB — BASIC METABOLIC PANEL
Anion gap: 9 (ref 5–15)
BUN: 15 mg/dL (ref 6–20)
CO2: 22 mmol/L (ref 22–32)
Calcium: 9 mg/dL (ref 8.9–10.3)
Chloride: 105 mmol/L (ref 98–111)
Creatinine, Ser: 1.2 mg/dL (ref 0.61–1.24)
GFR, Estimated: >60 mL/min (ref >60)
Glucose, Bld: 123 mg/dL — ABNORMAL HIGH (ref 70–99)
Potassium: 3.8 mmol/L (ref 3.5–5.1)
Sodium: 136 mmol/L (ref 135–145)

## 2023-06-23 LAB — PROTIME-INR
INR: 1 (ref 0.8–1.2)
Prothrombin Time: 13.3 seconds (ref 11.4–15.2)

## 2023-06-23 LAB — HEPARIN LEVEL (UNFRACTIONATED)
Heparin Unfractionated: 0.26 IU/mL — ABNORMAL LOW (ref 0.30–0.70)
Heparin Unfractionated: 0.33 IU/mL (ref 0.30–0.70)

## 2023-06-23 LAB — HIV ANTIBODY (ROUTINE TESTING W REFLEX): HIV Screen 4th Generation wRfx: NONREACTIVE

## 2023-06-23 MED ORDER — ALPRAZOLAM 0.5 MG PO TABS
0.5000 mg | ORAL_TABLET | Freq: Two times a day (BID) | ORAL | Status: DC | PRN
  Administered 2023-06-23 (×2): 0.5 mg via ORAL
  Filled 2023-06-23 (×2): qty 1

## 2023-06-23 NOTE — Progress Notes (Signed)
ANTICOAGULATION CONSULT NOTE  Pharmacy Consult for heparin Indication: chest pain/ACS  No Known Allergies  Patient Measurements: Heparin dosing wt: 96.8 kg  Vital Signs: Temp: 98.1 F (36.7 C) (06/29 1117) Temp Source: Oral (06/29 1117) BP: 103/69 (06/29 1117) Pulse Rate: 97 (06/29 1117)  Labs: Recent Labs    06/22/23 0835 06/22/23 0842 06/22/23 2047 06/23/23 0158 06/23/23 0716 06/23/23 1213  HGB 15.0 15.3  --  16.5  --   --   HCT 44.0 45.0  --  47.7  --   --   PLT  --   --   --  190  --   --   APTT  --   --  54*  --   --   --   LABPROT  --   --   --   --  13.3  --   INR  --   --   --   --  1.0  --   HEPARINUNFRC  --   --  0.34  --   --  0.26*  CREATININE  --   --   --  1.20  --   --      Estimated Creatinine Clearance: 87.3 mL/min (by C-G formula based on SCr of 1.2 mg/dL).   Medical History: Past Medical History:  Diagnosis Date   Acute MI anterior wall first episode care (HCC) 07/13/2017   Occluded ostial LAD S/P 3.5x26 Onyx DES. RI 80%. RCA Occluded at prior stent site with collaterals    Cardiogenic shock (HCC) 07/13/2017   CHF (congestive heart failure) (HCC)    Graves disease    Hypertension    MI (myocardial infarction) (HCC)    Thyroid disease    thyroid irradiated per pt    Medications:  Medications Prior to Admission  Medication Sig Dispense Refill Last Dose   apixaban (ELIQUIS) 5 MG TABS tablet Take 1 tablet (5 mg total) by mouth 2 (two) times daily. Await additional instructions once at [facility] 60 tablet 0 06/18/2023 at 0800   aspirin 81 MG chewable tablet Chew 1 tablet (81 mg total) by mouth daily. 30 tablet 11 06/22/2023   atorvastatin (LIPITOR) 20 MG tablet Take 1 tablet (20 mg total) by mouth daily. 90 tablet 0 06/21/2023   carvedilol (COREG) 3.125 MG tablet Take 1 tablet (3.125 mg total) by mouth 2 (two) times daily. 180 tablet 3 06/22/2023 at 0800   dapagliflozin propanediol (FARXIGA) 10 MG TABS tablet Take 1 tablet (10 mg total) by mouth  daily before breakfast. 90 tablet 2 06/22/2023   Evolocumab (REPATHA SURECLICK) 140 MG/ML SOAJ inject 140mg  every 14 days (Patient taking differently: Inject 140 mg into the skin every 14 (fourteen) days.) 6 mL 4 06/16/2023   ezetimibe (ZETIA) 10 MG tablet Take 1 tablet (10 mg total) by mouth daily. 90 tablet 0 06/21/2023   levothyroxine (SYNTHROID) 137 MCG tablet Take 1 tablet (137 mcg total) by mouth daily. 30 tablet 0 06/22/2023   pantoprazole (PROTONIX) 40 MG tablet Take 1 tablet (40 mg total) by mouth 2 (two) times daily. 60 tablet 3 06/22/2023   sacubitril-valsartan (ENTRESTO) 24-26 MG Take 1 tablet by mouth 2 (two) times daily. 180 tablet 0 06/22/2023   sildenafil (VIAGRA) 100 MG tablet Take 1 tablet (100 mg total) by mouth as needed for erectile dysfunction. TAKE 1 (ONE) TABLET AS NEEDED. DO NOT USE AT SAME TIME AS NITRO 30 tablet 3 unknown   spironolactone (ALDACTONE) 25 MG tablet Take 1 tablet (25  mg total) by mouth daily. 90 tablet 0 06/22/2023   ALPRAZolam (XANAX) 0.5 MG tablet Take 1 tablet (0.5 mg total) by mouth at bedtime as needed. (Patient not taking: Reported on 06/22/2023) 15 tablet 0 Not Taking   ivabradine (CORLANOR) 5 MG TABS tablet Take 1 tablet (5 mg total) by mouth 2 (two) times daily with a meal. 180 tablet 0    nitroGLYCERIN (NITROSTAT) 0.4 MG SL tablet PLACE 1 TABLET UNDER THE TONGUE EVERY 5 MINUTES AS NEEDED FOR CHEST PAIN. (Patient taking differently: Place 0.4 mg under the tongue every 5 (five) minutes as needed for chest pain.) 25 tablet 2 06/15/2023    Assessment: 52 yo male s/p cath at [facility] with 2VCAD for possible CABG. Pharmacy consulted to dose heparin. Afib diagnosed in ER on 05/2023, converted spontaneously on diltiazem. He is on apixaban PTA with last dose unknown   Heparin level is subtherapeutic today at 0.26. No issues with infusion or bleeding per RN.   Goal of Therapy:  Heparin level 0.3-0.7 units/ml Monitor platelets by anticoagulation protocol: Yes    Plan: Increase heparin infusion to 1600 units/hr Check anti-Xa level in 6 hours and daily while on heparin Continue to monitor H&H and platelets CABG planned for Monday   Thank you for allowing pharmacy to be a part of this patient's care.   Signe Colt, PharmD 06/23/2023 1:38 PM  **Pharmacist phone directory can be found on [email or web address] listed under [facility]**

## 2023-06-23 NOTE — Progress Notes (Signed)
Pt received OHS book, Move in the Tube sheet, OHS careguide, and incentive spirometer (IS). Pt was educated on approx length of surgery and stay, importance of ambulation and using IS, restrictions, home needs, and CRPII. Pt will be referred to [facility].   Pt IS value >2554mL

## 2023-06-23 NOTE — Progress Notes (Addendum)
ANTICOAGULATION CONSULT NOTE  Pharmacy Consult for heparin Indication: chest pain/ACS  No Known Allergies  Patient Measurements: Heparin dosing wt: 96.8 kg  Vital Signs: Temp: 98.2 F (36.8 C) (06/29 1900) Temp Source: Oral (06/29 1900) BP: 107/64 (06/29 1900) Pulse Rate: 63 (06/29 1900)  Labs: Recent Labs    06/22/23 0835 06/22/23 0842 06/22/23 2047 06/23/23 0158 06/23/23 0716 06/23/23 1213 06/23/23 2025  HGB 15.0 15.3  --  16.5  --   --   --   HCT 44.0 45.0  --  47.7  --   --   --   PLT  --   --   --  190  --   --   --   APTT  --   --  54*  --   --   --   --   LABPROT  --   --   --   --  13.3  --   --   INR  --   --   --   --  1.0  --   --   HEPARINUNFRC  --   --  0.34  --   --  0.26* 0.33  CREATININE  --   --   --  1.20  --   --   --      Estimated Creatinine Clearance: 87.3 mL/min (by C-G formula based on SCr of 1.2 mg/dL).   Medical History: Past Medical History:  Diagnosis Date   Acute MI anterior wall first episode care (HCC) 07/13/2017   Occluded ostial LAD S/P 3.5x26 Onyx DES. RI 80%. RCA Occluded at prior stent site with collaterals    Cardiogenic shock (HCC) 07/13/2017   CHF (congestive heart failure) (HCC)    Graves disease    Hypertension    MI (myocardial infarction) (HCC)    Thyroid disease    thyroid irradiated per pt    Medications:  Medications Prior to Admission  Medication Sig Dispense Refill Last Dose   apixaban (ELIQUIS) 5 MG TABS tablet Take 1 tablet (5 mg total) by mouth 2 (two) times daily. Await additional instructions once at [facility] 60 tablet 0 06/18/2023 at 0800   aspirin 81 MG chewable tablet Chew 1 tablet (81 mg total) by mouth daily. 30 tablet 11 06/22/2023   atorvastatin (LIPITOR) 20 MG tablet Take 1 tablet (20 mg total) by mouth daily. 90 tablet 0 06/21/2023   carvedilol (COREG) 3.125 MG tablet Take 1 tablet (3.125 mg total) by mouth 2 (two) times daily. 180 tablet 3 06/22/2023 at 0800   dapagliflozin propanediol (FARXIGA)  10 MG TABS tablet Take 1 tablet (10 mg total) by mouth daily before breakfast. 90 tablet 2 06/22/2023   Evolocumab (REPATHA SURECLICK) 140 MG/ML SOAJ inject 140mg  every 14 days (Patient taking differently: Inject 140 mg into the skin every 14 (fourteen) days.) 6 mL 4 06/16/2023   ezetimibe (ZETIA) 10 MG tablet Take 1 tablet (10 mg total) by mouth daily. 90 tablet 0 06/21/2023   levothyroxine (SYNTHROID) 137 MCG tablet Take 1 tablet (137 mcg total) by mouth daily. 30 tablet 0 06/22/2023   pantoprazole (PROTONIX) 40 MG tablet Take 1 tablet (40 mg total) by mouth 2 (two) times daily. 60 tablet 3 06/22/2023   sacubitril-valsartan (ENTRESTO) 24-26 MG Take 1 tablet by mouth 2 (two) times daily. 180 tablet 0 06/22/2023   sildenafil (VIAGRA) 100 MG tablet Take 1 tablet (100 mg total) by mouth as needed for erectile dysfunction. TAKE 1 (ONE) TABLET AS  NEEDED. DO NOT USE AT SAME TIME AS NITRO 30 tablet 3 unknown   spironolactone (ALDACTONE) 25 MG tablet Take 1 tablet (25 mg total) by mouth daily. 90 tablet 0 06/22/2023   ALPRAZolam (XANAX) 0.5 MG tablet Take 1 tablet (0.5 mg total) by mouth at bedtime as needed. (Patient not taking: Reported on 06/22/2023) 15 tablet 0 Not Taking   ivabradine (CORLANOR) 5 MG TABS tablet Take 1 tablet (5 mg total) by mouth 2 (two) times daily with a meal. 180 tablet 0    nitroGLYCERIN (NITROSTAT) 0.4 MG SL tablet PLACE 1 TABLET UNDER THE TONGUE EVERY 5 MINUTES AS NEEDED FOR CHEST PAIN. (Patient taking differently: Place 0.4 mg under the tongue every 5 (five) minutes as needed for chest pain.) 25 tablet 2 06/15/2023    Assessment: 52 yo male s/p cath at [facility] with 2VCAD for possible CABG. Pharmacy consulted to dose heparin. Afib diagnosed in ER on 05/2023, converted spontaneously on diltiazem. He is on apixaban PTA with last dose unknown  Heparin level 0.33 units/mL (therapeutic) on heparin 1600 units/hr  Goal of Therapy:  Heparin level 0.3-0.7 units/ml Monitor platelets by  anticoagulation protocol: Yes   Plan: Increase heparin slightly to 1650 units/hr to ensure remains therapeutic Repeat heparin level with AM labs Continue to monitor H&H and platelets CABG planned for Monday  Thank you for allowing pharmacy to be a part of this patient's care.   Eldridge Scot, PharmD Clinical Pharmacist 06/23/2023 9:36 PM

## 2023-06-24 ENCOUNTER — Inpatient Hospital Stay (HOSPITAL_COMMUNITY): Payer: BC Managed Care – PPO

## 2023-06-24 DIAGNOSIS — I480 Paroxysmal atrial fibrillation: Secondary | ICD-10-CM | POA: Diagnosis not present

## 2023-06-24 DIAGNOSIS — I70203 Unspecified atherosclerosis of native arteries of extremities, bilateral legs: Secondary | ICD-10-CM

## 2023-06-24 DIAGNOSIS — Z01810 Encounter for preprocedural cardiovascular examination: Secondary | ICD-10-CM

## 2023-06-24 DIAGNOSIS — I5022 Chronic systolic (congestive) heart failure: Secondary | ICD-10-CM | POA: Diagnosis not present

## 2023-06-24 DIAGNOSIS — I200 Unstable angina: Secondary | ICD-10-CM | POA: Diagnosis not present

## 2023-06-24 LAB — BASIC METABOLIC PANEL
Anion gap: 15 (ref 5–15)
BUN: 20 mg/dL (ref 6–20)
CO2: 24 mmol/L (ref 22–32)
Calcium: 9.5 mg/dL (ref 8.9–10.3)
Chloride: 100 mmol/L (ref 98–111)
Creatinine, Ser: 1.4 mg/dL — ABNORMAL HIGH (ref 0.61–1.24)
GFR, Estimated: >60 mL/min (ref >60)
Glucose, Bld: 119 mg/dL — ABNORMAL HIGH (ref 70–99)
Potassium: 4.2 mmol/L (ref 3.5–5.1)
Sodium: 139 mmol/L (ref 135–145)

## 2023-06-24 LAB — CBC
HCT: 47 % (ref 39.0–52.0)
HCT: 48.1 % (ref 39.0–52.0)
Hemoglobin: 16.3 g/dL (ref 13.0–17.0)
Hemoglobin: 16.8 g/dL (ref 13.0–17.0)
MCH: 32.4 pg (ref 26.0–34.0)
MCH: 32.8 pg (ref 26.0–34.0)
MCHC: 34.7 g/dL (ref 30.0–36.0)
MCHC: 34.9 g/dL (ref 30.0–36.0)
MCV: 92.7 fL (ref 80.0–100.0)
MCV: 94.6 fL (ref 80.0–100.0)
Platelets: 173 10*3/uL (ref 150–400)
Platelets: 181 10*3/uL (ref 150–400)
RBC: 4.97 MIL/uL (ref 4.22–5.81)
RBC: 5.19 MIL/uL (ref 4.22–5.81)
RDW: 13.5 % (ref 11.5–15.5)
RDW: 13.7 % (ref 11.5–15.5)
WBC: 6.9 10*3/uL (ref 4.0–10.5)
WBC: 8 10*3/uL (ref 4.0–10.5)
nRBC: 0 % (ref 0.0–0.2)
nRBC: 0 % (ref 0.0–0.2)

## 2023-06-24 LAB — SARS CORONAVIRUS 2 (TAT 6-24 HRS): SARS Coronavirus 2: NEGATIVE

## 2023-06-24 LAB — HEPARIN LEVEL (UNFRACTIONATED): Heparin Unfractionated: 0.65 IU/mL (ref 0.30–0.70)

## 2023-06-24 MED ORDER — CHLORHEXIDINE GLUCONATE CLOTH 2 % EX PADS: 6.0000 | MEDICATED_PAD | Freq: Once | CUTANEOUS | Status: DC

## 2023-06-24 MED ORDER — DIAZEPAM 5 MG PO TABS
5.0000 mg | ORAL_TABLET | Freq: Once | ORAL | Status: AC
  Administered 2023-06-25: 5 mg via ORAL
  Filled 2023-06-24: qty 1

## 2023-06-24 MED ORDER — CHLORHEXIDINE GLUCONATE 4 % EX SOLN
Freq: Once | CUTANEOUS | Status: AC
  Filled 2023-06-24: qty 15

## 2023-06-24 MED ORDER — BISACODYL 5 MG PO TBEC: 5.0000 mg | DELAYED_RELEASE_TABLET | Freq: Once | ORAL | Status: DC

## 2023-06-24 MED ORDER — CHLORHEXIDINE GLUCONATE 4 % EX SOLN
Freq: Once | CUTANEOUS | Status: DC
  Filled 2023-06-24 (×2): qty 15

## 2023-06-24 MED ORDER — METOPROLOL TARTRATE 12.5 MG HALF TABLET
12.5000 mg | ORAL_TABLET | Freq: Once | ORAL | Status: AC
  Administered 2023-06-25: 12.5 mg via ORAL
  Filled 2023-06-24: qty 1

## 2023-06-24 MED ORDER — CHLORHEXIDINE GLUCONATE 0.12 % MT SOLN
15.0000 mL | Freq: Once | OROMUCOSAL | Status: AC
  Administered 2023-06-25: 15 mL via OROMUCOSAL
  Filled 2023-06-24: qty 15

## 2023-06-24 NOTE — Progress Notes (Signed)
VASCULAR LAB    Pre CABG Dopplers have been performed.  See CV proc for preliminary results.   [name], RVT 06/24/2023, 9:20 AM

## 2023-06-24 NOTE — Progress Notes (Signed)
Rounding Note    Patient Name: Bobby [name] Date of Encounter: 06/24/2023  [facility] Cardiologist: Julien Nordmann, MD   Subjective   Doing okay this morning. No chest pain or SOB. Anxious about going to the OR tomorrow.  Cr bumped slightly 1.2>1.4  Planned for CABG tomorrow  Inpatient Medications    Scheduled Meds:  aspirin  81 mg Oral Daily   aspirin EC  81 mg Oral Daily   atorvastatin  20 mg Oral Daily   carvedilol  3.125 mg Oral BID   dapagliflozin propanediol  10 mg Oral QAC breakfast   [START ON 06/25/2023] epinephrine  0-10 mcg/min Intravenous To OR   ezetimibe  10 mg Oral Daily   [START ON 06/25/2023] heparin sodium (porcine) 2,500 Units, papaverine 30 mg in electrolyte-A (PLASMALYTE-A PH 7.4) 500 mL irrigation   Irrigation To OR   [START ON 06/25/2023] insulin   Intravenous To OR   ivabradine  5 mg Oral BID WC   levothyroxine  137 mcg Oral Daily   [START ON 06/25/2023] magnesium sulfate  40 mEq Other To OR   pantoprazole  40 mg Oral BID   [START ON 06/25/2023] phenylephrine  30-200 mcg/min Intravenous To OR   [START ON 06/25/2023] potassium chloride  80 mEq Other To OR   sacubitril-valsartan  1 tablet Oral BID   sodium chloride flush  3 mL Intravenous Q12H   spironolactone  25 mg Oral Daily   [START ON 06/25/2023] tranexamic acid  15 mg/kg Intravenous To OR   [START ON 06/25/2023] tranexamic acid  2 mg/kg Intracatheter To OR   Continuous Infusions:  sodium chloride     [START ON 06/25/2023]  ceFAZolin (ANCEF) IV     [START ON 06/25/2023]  ceFAZolin (ANCEF) IV     [START ON 06/25/2023] dexmedetomidine     [START ON 06/25/2023] heparin 30,000 units/NS 1000 mL solution for CELLSAVER     heparin 1,650 Units/hr (06/23/23 2300)   [START ON 06/25/2023] milrinone     [START ON 06/25/2023] nitroGLYCERIN     [START ON 06/25/2023] norepinephrine     [START ON 06/25/2023] tranexamic acid (CYKLOKAPRON) 2,500 mg in sodium chloride 0.9 % 250 mL (10 mg/mL) infusion     [START ON 06/25/2023]  vancomycin     PRN Meds: sodium chloride, acetaminophen, ALPRAZolam, nitroGLYCERIN, ondansetron (ZOFRAN) IV, sodium chloride flush   Vital Signs    Vitals:   06/23/23 1900 06/24/23 0025 06/24/23 0421 06/24/23 0450  BP: 107/64  111/64 99/61  Pulse: 63 64 (!) 53 (!) 52  Resp: 18 18 20 18   Temp: 98.2 F (36.8 C) 97.6 F (36.4 C) 97.8 F (36.6 C) 97.7 F (36.5 C)  TempSrc: Oral Oral Oral Oral  SpO2: 98%  96% 96%  Weight:      Height:        Intake/Output Summary (Last 24 hours) at 06/24/2023 0652 Last data filed at 06/24/2023 0457 Gross per 24 hour  Intake 240 ml  Output 2400 ml  Net -2160 ml       06/23/2023    6:01 AM 06/22/2023    3:05 PM 06/22/2023    6:49 AM  Last 3 Weights  Weight (lbs) 213 lb 8 oz 215 lb 215 lb  Weight (kg) 96.843 kg 97.523 kg 97.523 kg      Telemetry    NSR/SB - Personally Reviewed  ECG    No new tracing - Personally Reviewed  Physical Exam   GEN: No acute distress.  Neck: No JVD Cardiac: RRR, no murmurs, rubs, or gallops.  Respiratory: Clear to auscultation bilaterally. GI: Soft, nontender, non-distended  MS: No edema; No deformity. Neuro:  Nonfocal  Psych: Normal affect   Labs    High Sensitivity Troponin:   Recent Labs  Lab 06/15/23 0641 06/15/23 0954  TROPONINIHS 22* 97*      Chemistry Recent Labs  Lab 06/22/23 0842 06/23/23 0158 06/24/23 0028  NA 138 136 139  K 4.1 3.8 4.2  CL  --  105 100  CO2  --  22 24  GLUCOSE  --  123* 119*  BUN  --  15 20  CREATININE  --  1.20 1.40*  CALCIUM  --  9.0 9.5  GFRNONAA  --  >60 >60  ANIONGAP  --  9 15     Lipids No results for input(s): "CHOL", "TRIG", "HDL", "LABVLDL", "LDLCALC", "CHOLHDL" in the last 168 hours.  Hematology Recent Labs  Lab 06/22/23 0842 06/23/23 0158 06/24/23 0028  WBC  --  8.5 8.0  RBC  --  5.17 4.97  HGB 15.3 16.5 16.3  HCT 45.0 47.7 47.0  MCV  --  92.3 94.6  MCH  --  31.9 32.8  MCHC  --  34.6 34.7  RDW  --  13.7 13.7  PLT  --  190 173     Thyroid No results for input(s): "TSH", "FREET4" in the last 168 hours.  BNPNo results for input(s): "BNP", "PROBNP" in the last 168 hours.  DDimer No results for input(s): "DDIMER" in the last 168 hours.   Radiology    DG Chest 2 View  Result Date: 06/23/2023 CLINICAL DATA:  Coronary artery disease with history of myocardial infarction. EXAM: CHEST - 2 VIEW COMPARISON:  Chest radiograph 06/15/2023, 06/29/2020 FINDINGS: Of note, the patient is slightly rotated on this examination. The heart size and mediastinal contours are within normal limits. Coronary artery stent. Both lungs are clear. No pleural effusion or pneumothorax. No acute osseous abnormality. IMPRESSION: No active cardiopulmonary disease. Electronically Signed   By: Sherron Ales M.D.   On: 06/23/2023 12:27   ECHOCARDIOGRAM COMPLETE  Result Date: 06/22/2023    ECHOCARDIOGRAM REPORT   Patient Name:   Bobby [name] Date of Exam: 06/22/2023 Medical Rec #:  [id number]   Height:       73.0 in Accession #:    [id number]  Weight:       215.0 lb Date of Birth:  1971-07-07  BSA:          2.219 m Patient Age:    52 years    BP:           100/79 mmHg Patient Gender: M           HR:           63 bpm. Exam Location:  [facility] Procedure: 2D Echo, Cardiac Doppler, Color Doppler and Intracardiac            Opacification Agent Indications:     Acute ischemic heart disease, unspecified I24.9  History:         Patient has prior history of Echocardiogram examinations, most                  recent 07/14/2017. CHF; Previous Myocardial Infarction.  Sonographer:     Cristela Blue Referring Phys:  [id number] CHRISTOPHER END Diagnosing Phys: Yvonne Kendall MD  Sonographer Comments: Suboptimal parasternal window and suboptimal apical window. IMPRESSIONS  1. Left ventricular ejection fraction, by  estimation, is 25 to 30%. The left ventricle has severely decreased function. The left ventricle demonstrates regional wall motion abnormalities (see scoring diagram/findings for  description). There is mild left  ventricular hypertrophy. Left ventricular diastolic parameters are consistent with Grade I diastolic dysfunction (impaired relaxation). There is akinesis of the left ventricular, entire anterior wall and apical segment.  2. Right ventricular systolic function is mildly reduced. The right ventricular size is mildly enlarged. Tricuspid regurgitation signal is inadequate for assessing PA pressure.  3. The mitral valve was not well visualized. No evidence of mitral valve regurgitation. No evidence of mitral stenosis.  4. The aortic valve was not well visualized. Aortic valve regurgitation is not visualized. No aortic stenosis is present. FINDINGS  Left Ventricle: Left ventricular ejection fraction, by estimation, is 25 to 30%. The left ventricle has severely decreased function. The left ventricle demonstrates regional wall motion abnormalities. Definity contrast agent was given IV to delineate the left ventricular endocardial borders. The left ventricular internal cavity size was normal in size. There is mild left ventricular hypertrophy. Left ventricular diastolic parameters are consistent with Grade I diastolic dysfunction (impaired relaxation). Right Ventricle: The right ventricular size is mildly enlarged. No increase in right ventricular wall thickness. Right ventricular systolic function is mildly reduced. Tricuspid regurgitation signal is inadequate for assessing PA pressure. Left Atrium: Left atrial size was normal in size. Right Atrium: Right atrial size was normal in size. Pericardium: There is no evidence of pericardial effusion. Mitral Valve: The mitral valve was not well visualized. No evidence of mitral valve regurgitation. No evidence of mitral valve stenosis. MV peak gradient, 2.4 mmHg. The mean mitral valve gradient is 1.0 mmHg. Tricuspid Valve: The tricuspid valve is not well visualized. Tricuspid valve regurgitation is trivial. Aortic Valve: The aortic valve was not  well visualized. Aortic valve regurgitation is not visualized. No aortic stenosis is present. Aortic valve mean gradient measures 2.0 mmHg. Aortic valve peak gradient measures 3.4 mmHg. Aortic valve area, by VTI measures 2.96 cm. Pulmonic Valve: The pulmonic valve was not well visualized. Pulmonic valve regurgitation is not visualized. No evidence of pulmonic stenosis. Aorta: The aortic root is normal in size and structure. Pulmonary Artery: The pulmonary artery is not well seen. Venous: The inferior vena cava was not well visualized. IAS/Shunts: The interatrial septum was not well visualized.  LEFT VENTRICLE PLAX 2D LVIDd:         4.70 cm      Diastology LVIDs:         4.00 cm      LV e' medial:    5.44 cm/s LV PW:         1.10 cm      LV E/e' medial:  11.9 LV IVS:        1.10 cm      LV e' lateral:   7.51 cm/s LVOT diam:     2.00 cm      LV E/e' lateral: 8.7 LV SV:         47 LV SV Index:   21 LVOT Area:     3.14 cm  LV Volumes (MOD) LV vol d, MOD A2C: 86.1 ml LV vol d, MOD A4C: 150.0 ml LV vol s, MOD A2C: 74.1 ml LV vol s, MOD A4C: 106.0 ml LV SV MOD A2C:     12.0 ml LV SV MOD A4C:     150.0 ml LV SV MOD BP:      22.5 ml RIGHT VENTRICLE RV Basal  diam:  4.90 cm RV Mid diam:    3.60 cm RV S prime:     10.80 cm/s TAPSE (M-mode): 1.4 cm LEFT ATRIUM           Index        RIGHT ATRIUM           Index LA diam:      3.30 cm 1.49 cm/m   RA Area:     17.00 cm LA Vol (A2C): 53.4 ml 24.07 ml/m  RA Volume:   49.00 ml  22.08 ml/m LA Vol (A4C): 25.2 ml 11.36 ml/m  AORTIC VALVE AV Area (Vmax):    2.63 cm AV Area (Vmean):   2.72 cm AV Area (VTI):     2.96 cm AV Vmax:           91.60 cm/s AV Vmean:          60.300 cm/s AV VTI:            0.160 m AV Peak Grad:      3.4 mmHg AV Mean Grad:      2.0 mmHg LVOT Vmax:         76.60 cm/s LVOT Vmean:        52.300 cm/s LVOT VTI:          0.151 m LVOT/AV VTI ratio: 0.94  AORTA Ao Root diam: 2.60 cm MITRAL VALVE MV Area (PHT): 3.31 cm    SHUNTS MV Area VTI:   2.08 cm    Systemic  VTI:  0.15 m MV Peak grad:  2.4 mmHg    Systemic Diam: 2.00 cm MV Mean grad:  1.0 mmHg MV Vmax:       0.78 m/s MV Vmean:      43.1 cm/s MV Decel Time: 229 msec MV E velocity: 65.00 cm/s MV A velocity: 79.10 cm/s MV E/A ratio:  0.82 Christopher End MD Electronically signed by Yvonne Kendall MD Signature Date/Time: 06/22/2023/1:05:21 PM    Final    CARDIAC CATHETERIZATION  Result Date: 06/22/2023 Conclusions: Significant two-vessel coronary artery disease, including 90% ostial LCx stenosis (ostium appears jailed by proximal LAD stent extending back into the distal LMCA) and chronic total occlusion of proximal RCA stent. Widely patent ostial/proximal LAD stent. Severely reduced left ventricular systolic function (LVEF 25-35%). Upper normal to mildly elevated left and right heart filling pressures (LVEDP 16 mmHg, PCWP 16 mm Hg, mean RA 7). Low normal to mildly reduced cardiac output/index (Fick CO 5.1 L/min, CI 2.3 L/min/m^2). Angiographically normal abdominal aorta, iliac arteries, and right common femoral artery. Recommendations: Transfer to [name] for cardiac surgery consultation.  I would favor bypass of OM1 +/- OM3 and distal RCA branches.  PCI would be high risk and would require bifurcation stenting from LMCA into OM1 with hemodynamic support if the patient is not a candidate for bypass. Start heparin infusion 2 hours after TR band removal. Consider consultation with advanced heart failure team when the patient reaches [name] to continue optimization of HFrEF in anticipation of revascularization. Aggressive secondary prevention of coronary artery disease. Yvonne Kendall, MD [name]  PERIPHERAL VASCULAR CATHETERIZATION  Result Date: 06/22/2023 Conclusions: Significant two-vessel coronary artery disease, including 90% ostial LCx stenosis (ostium appears jailed by proximal LAD stent extending back into the distal LMCA) and chronic total occlusion of proximal RCA stent. Widely patent  ostial/proximal LAD stent. Severely reduced left ventricular systolic function (LVEF 25-35%). Upper normal to mildly elevated left and right heart filling pressures (LVEDP 16  mmHg, PCWP 16 mm Hg, mean RA 7). Low normal to mildly reduced cardiac output/index (Fick CO 5.1 L/min, CI 2.3 L/min/m^2). Angiographically normal abdominal aorta, iliac arteries, and right common femoral artery. Recommendations: Transfer to [name] for cardiac surgery consultation.  I would favor bypass of OM1 +/- OM3 and distal RCA branches.  PCI would be high risk and would require bifurcation stenting from LMCA into OM1 with hemodynamic support if the patient is not a candidate for bypass. Start heparin infusion 2 hours after TR band removal. Consider consultation with advanced heart failure team when the patient reaches [name] to continue optimization of HFrEF in anticipation of revascularization. Aggressive secondary prevention of coronary artery disease. Yvonne Kendall, MD [name]   Cardiac Studies   Cardiac Studies & Procedures   CARDIAC CATHETERIZATION  CARDIAC CATHETERIZATION 06/22/2023  Narrative Conclusions: Significant two-vessel coronary artery disease, including 90% ostial LCx stenosis (ostium appears jailed by proximal LAD stent extending back into the distal LMCA) and chronic total occlusion of proximal RCA stent. Widely patent ostial/proximal LAD stent. Severely reduced left ventricular systolic function (LVEF 25-35%). Upper normal to mildly elevated left and right heart filling pressures (LVEDP 16 mmHg, PCWP 16 mm Hg, mean RA 7). Low normal to mildly reduced cardiac output/index (Fick CO 5.1 L/min, CI 2.3 L/min/m^2). Angiographically normal abdominal aorta, iliac arteries, and right common femoral artery.  Recommendations: Transfer to [name] for cardiac surgery consultation.  I would favor bypass of OM1 +/- OM3 and distal RCA branches.  PCI would be high risk and would require bifurcation  stenting from LMCA into OM1 with hemodynamic support if the patient is not a candidate for bypass. Start heparin infusion 2 hours after TR band removal. Consider consultation with advanced heart failure team when the patient reaches [name] to continue optimization of HFrEF in anticipation of revascularization. Aggressive secondary prevention of coronary artery disease.  Yvonne Kendall, MD [name]  Findings Coronary Findings Diagnostic  Dominance: Right  Left Main Vessel is large.  Left Anterior Descending Vessel is large. Non-stenotic Ost LAD to Prox LAD lesion was previously treated.  First Diagonal Branch Vessel is moderate in size. Ost 1st Diag lesion is 25% stenosed.  Ramus Intermedius Ost Ramus lesion is 50% stenosed.  Left Circumflex Vessel is large. Ost Cx lesion is 90% stenosed. Vessel is the culprit lesion.  First Obtuse Marginal Branch Vessel is large in size. 1st Mrg lesion is 25% stenosed.  Second Obtuse Marginal Branch Vessel is small in size.  Third Obtuse Marginal Branch Vessel is moderate in size.  Right Coronary Artery Collaterals Mid RCA filled by collaterals from Dist LAD.  Collaterals Mid RCA filled by collaterals from Dist Cx.  Prox RCA lesion is 100% stenosed. The lesion was previously treated . Known CTO. RCA not engaged today  Intervention  No interventions have been documented.   CARDIAC CATHETERIZATION  CARDIAC CATHETERIZATION 08/10/2020  Narrative LM: Normal LAD: Patent ostial LAD stent. 20% ostial diagonal stenosis LCx: 20% OM1 stenosis RCA: Prox in-stent CTO. Left-to-left collaterals fill up to mid RCA Normal LVEDP  Findings Coronary Findings Diagnostic  Dominance: Right  Left Anterior Descending Previously placed Ost LAD to Prox LAD stent (unknown type) is widely patent.  First Diagonal Branch Ost 1st Diag lesion is 25% stenosed.  Ramus Intermedius Ost Ramus lesion is 50% stenosed.  Left  Circumflex  Third Obtuse Marginal Branch Vessel is small in size.  Right Coronary Artery Collaterals Mid RCA filled by collaterals from Dist LAD.  Collaterals Mid RCA filled by collaterals from Dist Cx.  Prox RCA lesion is 100% stenosed. The lesion was previously treated. Known CTO. RCA not engaged today  Intervention  No interventions have been documented.   STRESS TESTS  NM MYOCAR MULTI W/SPECT W 08/02/2022  Narrative   Findings are consistent with prior myocardial infarction. The study is intermediate risk.   No ST deviation was noted.   LV perfusion is abnormal. There is no evidence of ischemia. There is evidence of infarction. Defect 1: There is a large defect with absent uptake present in the apical to mid anterior, anteroseptal and apex location(s) that is fixed. There is abnormal wall motion in the defect area. Consistent with infarction.   Left ventricular function is abnormal.  41%.  End diastolic cavity size is mildly enlarged. End systolic cavity size is mildly enlarged.   There is evidence of a large LAD infarct with minimal peri-infarct ischemia.  ECHOCARDIOGRAM  ECHOCARDIOGRAM COMPLETE 06/22/2023  Narrative ECHOCARDIOGRAM REPORT    Patient Name:   Bobby [name] Date of Exam: 06/22/2023 Medical Rec #:  [id number]   Height:       73.0 in Accession #:    [id number]  Weight:       215.0 lb Date of Birth:  1971-12-08  BSA:          2.219 m Patient Age:    52 years    BP:           100/79 mmHg Patient Gender: M           HR:           63 bpm. Exam Location:  [facility]  Procedure: 2D Echo, Cardiac Doppler, Color Doppler and Intracardiac Opacification Agent  Indications:     Acute ischemic heart disease, unspecified I24.9  History:         Patient has prior history of Echocardiogram examinations, most recent 07/14/2017. CHF; Previous Myocardial Infarction.  Sonographer:     Cristela Blue Referring Phys:  [id number] CHRISTOPHER END Diagnosing Phys: Yvonne Kendall  MD   Sonographer Comments: Suboptimal parasternal window and suboptimal apical window. IMPRESSIONS   1. Left ventricular ejection fraction, by estimation, is 25 to 30%. The left ventricle has severely decreased function. The left ventricle demonstrates regional wall motion abnormalities (see scoring diagram/findings for description). There is mild left ventricular hypertrophy. Left ventricular diastolic parameters are consistent with Grade I diastolic dysfunction (impaired relaxation). There is akinesis of the left ventricular, entire anterior wall and apical segment. 2. Right ventricular systolic function is mildly reduced. The right ventricular size is mildly enlarged. Tricuspid regurgitation signal is inadequate for assessing PA pressure. 3. The mitral valve was not well visualized. No evidence of mitral valve regurgitation. No evidence of mitral stenosis. 4. The aortic valve was not well visualized. Aortic valve regurgitation is not visualized. No aortic stenosis is present.  FINDINGS Left Ventricle: Left ventricular ejection fraction, by estimation, is 25 to 30%. The left ventricle has severely decreased function. The left ventricle demonstrates regional wall motion abnormalities. Definity contrast agent was given IV to delineate the left ventricular endocardial borders. The left ventricular internal cavity size was normal in size. There is mild left ventricular hypertrophy. Left ventricular diastolic parameters are consistent with Grade I diastolic dysfunction (impaired relaxation).  Right Ventricle: The right ventricular size is mildly enlarged. No increase in right ventricular wall thickness. Right ventricular systolic function is mildly reduced. Tricuspid regurgitation signal is inadequate for assessing PA pressure.  Left Atrium:  Left atrial size was normal in size.  Right Atrium: Right atrial size was normal in size.  Pericardium: There is no evidence of pericardial  effusion.  Mitral Valve: The mitral valve was not well visualized. No evidence of mitral valve regurgitation. No evidence of mitral valve stenosis. MV peak gradient, 2.4 mmHg. The mean mitral valve gradient is 1.0 mmHg.  Tricuspid Valve: The tricuspid valve is not well visualized. Tricuspid valve regurgitation is trivial.  Aortic Valve: The aortic valve was not well visualized. Aortic valve regurgitation is not visualized. No aortic stenosis is present. Aortic valve mean gradient measures 2.0 mmHg. Aortic valve peak gradient measures 3.4 mmHg. Aortic valve area, by VTI measures 2.96 cm.  Pulmonic Valve: The pulmonic valve was not well visualized. Pulmonic valve regurgitation is not visualized. No evidence of pulmonic stenosis.  Aorta: The aortic root is normal in size and structure.  Pulmonary Artery: The pulmonary artery is not well seen.  Venous: The inferior vena cava was not well visualized.  IAS/Shunts: The interatrial septum was not well visualized.   LEFT VENTRICLE PLAX 2D LVIDd:         4.70 cm      Diastology LVIDs:         4.00 cm      LV e' medial:    5.44 cm/s LV PW:         1.10 cm      LV E/e' medial:  11.9 LV IVS:        1.10 cm      LV e' lateral:   7.51 cm/s LVOT diam:     2.00 cm      LV E/e' lateral: 8.7 LV SV:         47 LV SV Index:   21 LVOT Area:     3.14 cm  LV Volumes (MOD) LV vol d, MOD A2C: 86.1 ml LV vol d, MOD A4C: 150.0 ml LV vol s, MOD A2C: 74.1 ml LV vol s, MOD A4C: 106.0 ml LV SV MOD A2C:     12.0 ml LV SV MOD A4C:     150.0 ml LV SV MOD BP:      22.5 ml  RIGHT VENTRICLE RV Basal diam:  4.90 cm RV Mid diam:    3.60 cm RV S prime:     10.80 cm/s TAPSE (M-mode): 1.4 cm  LEFT ATRIUM           Index        RIGHT ATRIUM           Index LA diam:      3.30 cm 1.49 cm/m   RA Area:     17.00 cm LA Vol (A2C): 53.4 ml 24.07 ml/m  RA Volume:   49.00 ml  22.08 ml/m LA Vol (A4C): 25.2 ml 11.36 ml/m AORTIC VALVE AV Area (Vmax):    2.63  cm AV Area (Vmean):   2.72 cm AV Area (VTI):     2.96 cm AV Vmax:           91.60 cm/s AV Vmean:          60.300 cm/s AV VTI:            0.160 m AV Peak Grad:      3.4 mmHg AV Mean Grad:      2.0 mmHg LVOT Vmax:         76.60 cm/s LVOT Vmean:        52.300 cm/s LVOT  VTI:          0.151 m LVOT/AV VTI ratio: 0.94  AORTA Ao Root diam: 2.60 cm  MITRAL VALVE MV Area (PHT): 3.31 cm    SHUNTS MV Area VTI:   2.08 cm    Systemic VTI:  0.15 m MV Peak grad:  2.4 mmHg    Systemic Diam: 2.00 cm MV Mean grad:  1.0 mmHg MV Vmax:       0.78 m/s MV Vmean:      43.1 cm/s MV Decel Time: 229 msec MV E velocity: 65.00 cm/s MV A velocity: 79.10 cm/s MV E/A ratio:  0.82  Cristal Deer End MD Electronically signed by Yvonne Kendall MD Signature Date/Time: 06/22/2023/1:05:21 PM    Final   MONITORS  LONG TERM MONITOR-LIVE TELEMETRY (3-14 DAYS) 05/06/2020  Narrative Real-time Outpatient Cardiac Telemetry 04/01/2020 - 04/15/2020: Dominant rhythm: Sinus. HR 44-150 bpm. Avg HR 72 bpm. Supraventricular ectopy, PAC/upto 6 beat run. SVE burden <1% Ventricular ectopy, PVC, NSVT upto 5 beats. VE burden <1% No atrial fibrillation/atrial flutter/SVT/high grade AV block, sinus pause >3sec noted. 12 patient activated events do not correlate with arrhthymias.            Patient Profile     51 y.o. male  with history of CAD status post PCI to the ostial/proximal LAD in the setting of STEMI complicated by cardiogenic shock necessitating Impella placement in 06/2017 as well as remote PCI to the RCA with known chronic total occlusion on subsequent caths, ischemic cardiomyopathy with LVEF of 40-45%, hHLD and GERD, transferred from [place] due to unstable angina and severe two-vessel coronary artery disease and declining LVEF now awaiting evaluation for possible CABG vs high risk PCI.   Assessment & Plan    #Unstable Angina: #Known CAD with prior PCI: -LHC 6/28 with 2V CAD including 90% ostial  LCx stenosis (ostium appears jailed by proximal LAD stent extending back into the distal LMCA) and chronic total occlusion of proximal RCA stent, widely patent ostial/proximal LAD stent  -LVEF reduced from 40-45%>25-30% -Now transferred to [facility] for CABG on Monday -Continue heparin gtt -Continue ASA 81mg  daily -Continue lipitor 20mg  daily -Continue zetia 10mg  daily -Continue coreg 3.125mg  BID -On repatha as outpatient  #Chronic Systolic HF: #Ischemic CM: -TTE with drop in EF from 40-45% to 25-30% in the setting of significant CAD as above -RHC/LHC with LVEDP 16, PCWP 16, mRAP 7, CO 5.1, CI 2.3 -Continue coreg 3.125mg  BID -Hold entresto prior to OR -Continue farxiga 10mg  daily -Continue ivabradine 5mg  BID -Continue spiro 25mg  daily  #HTN: -Hold entresto 24-26mg  prior to OR -Continue spiro 25mg  daily -Continue coreg 3.125mg  BID -BP soft at baseline in 90s; feels okay with no significant orthostatic symptoms  #Paroxysmal Afib: -Diagnosed in ER on 05/2023 and converted spontaneously on dilt -Continue heparin gtt for now; will plan to transition back to apixaban prior to discharge -Continue coreg 3.125mg  BID  #HLD: -Continue lipitor 20mg  daily -Continue zetia 10mg  daily -On repatha as outpatient -Goal LDL<55; LDL currently 18   For questions or updates, please contact [facility] Please consult [email or web address] for contact info under        Signed, Meriam Sprague, MD  06/24/2023, 6:52 AM

## 2023-06-24 NOTE — Progress Notes (Signed)
ANTICOAGULATION CONSULT NOTE  Pharmacy Consult for heparin Indication: chest pain/ACS  No Known Allergies  Patient Measurements: Heparin dosing wt: 96.8 kg  Vital Signs: Temp: 97.7 F (36.5 C) (06/30 0450) Temp Source: Oral (06/30 0841) BP: 112/72 (06/30 0841) Pulse Rate: 62 (06/30 0841)  Labs: Recent Labs    06/22/23 2047 06/23/23 0158 06/23/23 0716 06/23/23 1213 06/23/23 2025 06/24/23 0028 06/24/23 0641  HGB  --  16.5  --   --   --  16.3 16.8  HCT  --  47.7  --   --   --  47.0 48.1  PLT  --  190  --   --   --  173 181  APTT 54*  --   --   --   --   --   --   LABPROT  --   --  13.3  --   --   --   --   INR  --   --  1.0  --   --   --   --   HEPARINUNFRC 0.34  --   --  0.26* 0.33  --  0.65  CREATININE  --  1.20  --   --   --  1.40*  --      Estimated Creatinine Clearance: 74.8 mL/min (A) (by C-G formula based on SCr of 1.4 mg/dL (H)).   Medical History: Past Medical History:  Diagnosis Date   Acute MI anterior wall first episode care (HCC) 07/13/2017   Occluded ostial LAD S/P 3.5x26 Onyx DES. RI 80%. RCA Occluded at prior stent site with collaterals    Cardiogenic shock (HCC) 07/13/2017   CHF (congestive heart failure) (HCC)    Graves disease    Hypertension    MI (myocardial infarction) (HCC)    Thyroid disease    thyroid irradiated per pt    Medications:  Medications Prior to Admission  Medication Sig Dispense Refill Last Dose   apixaban (ELIQUIS) 5 MG TABS tablet Take 1 tablet (5 mg total) by mouth 2 (two) times daily. Await additional instructions once at [facility] 60 tablet 0 06/18/2023 at 0800   aspirin 81 MG chewable tablet Chew 1 tablet (81 mg total) by mouth daily. 30 tablet 11 06/22/2023   atorvastatin (LIPITOR) 20 MG tablet Take 1 tablet (20 mg total) by mouth daily. 90 tablet 0 06/21/2023   carvedilol (COREG) 3.125 MG tablet Take 1 tablet (3.125 mg total) by mouth 2 (two) times daily. 180 tablet 3 06/22/2023 at 0800   dapagliflozin propanediol  (FARXIGA) 10 MG TABS tablet Take 1 tablet (10 mg total) by mouth daily before breakfast. 90 tablet 2 06/22/2023   Evolocumab (REPATHA SURECLICK) 140 MG/ML SOAJ inject 140mg  every 14 days (Patient taking differently: Inject 140 mg into the skin every 14 (fourteen) days.) 6 mL 4 06/16/2023   ezetimibe (ZETIA) 10 MG tablet Take 1 tablet (10 mg total) by mouth daily. 90 tablet 0 06/21/2023   levothyroxine (SYNTHROID) 137 MCG tablet Take 1 tablet (137 mcg total) by mouth daily. 30 tablet 0 06/22/2023   pantoprazole (PROTONIX) 40 MG tablet Take 1 tablet (40 mg total) by mouth 2 (two) times daily. 60 tablet 3 06/22/2023   sacubitril-valsartan (ENTRESTO) 24-26 MG Take 1 tablet by mouth 2 (two) times daily. 180 tablet 0 06/22/2023   sildenafil (VIAGRA) 100 MG tablet Take 1 tablet (100 mg total) by mouth as needed for erectile dysfunction. TAKE 1 (ONE) TABLET AS NEEDED. DO NOT USE AT SAME  TIME AS NITRO 30 tablet 3 unknown   spironolactone (ALDACTONE) 25 MG tablet Take 1 tablet (25 mg total) by mouth daily. 90 tablet 0 06/22/2023   ALPRAZolam (XANAX) 0.5 MG tablet Take 1 tablet (0.5 mg total) by mouth at bedtime as needed. (Patient not taking: Reported on 06/22/2023) 15 tablet 0 Not Taking   ivabradine (CORLANOR) 5 MG TABS tablet Take 1 tablet (5 mg total) by mouth 2 (two) times daily with a meal. 180 tablet 0    nitroGLYCERIN (NITROSTAT) 0.4 MG SL tablet PLACE 1 TABLET UNDER THE TONGUE EVERY 5 MINUTES AS NEEDED FOR CHEST PAIN. (Patient taking differently: Place 0.4 mg under the tongue every 5 (five) minutes as needed for chest pain.) 25 tablet 2 06/15/2023    Assessment: 52 yo male s/p cath at [facility] with 2VCAD for possible CABG. Pharmacy consulted to dose heparin. Afib diagnosed in ER on 05/2023, converted spontaneously on diltiazem. He is on apixaban PTA with last dose unknown  Heparin level remains therapeutic this morning at 0.65 units/mL  Goal of Therapy:  Heparin level 0.3-0.7 units/ml Monitor platelets by  anticoagulation protocol: Yes   Plan: Continue heparin infusion at 1650 units/hr Heparin level daily Continue to monitor H&H and platelets CABG planned for Monday   Thank you for allowing pharmacy to be a part of this patient's care.   Signe Colt, PharmD 06/24/2023 8:45 AM  **Pharmacist phone directory can be found on [email or web address] listed under [facility]**

## 2023-06-24 NOTE — Anesthesia Preprocedure Evaluation (Signed)
Anesthesia Evaluation  Patient identified by MRN, date of birth, ID band Patient awake    Reviewed: Allergy & Precautions, NPO status , Patient's Chart, lab work & pertinent test results  Airway Mallampati: II  TM Distance: >3 FB Neck ROM: Full    Dental no notable dental hx. (+) Dental Advisory Given, Teeth Intact   Pulmonary former smoker   Pulmonary exam normal breath sounds clear to auscultation       Cardiovascular hypertension, Pt. on home beta blockers + angina  + CAD, + Past MI and +CHF  Normal cardiovascular exam Rhythm:Regular Rate:Normal  Echo 06/22/2023  1. Left ventricular ejection fraction, by estimation, is 25 to 30%. The left ventricle has severely decreased function. The left ventricle demonstrates regional wall motion abnormalities (see scoring diagram/findings for description). There is mild left ventricular hypertrophy. Left ventricular diastolic parameters are consistent with Grade I diastolic dysfunction (impaired relaxation). There is akinesis of the left ventricular, entire anterior wall and apical segment.   2. Right ventricular systolic function is mildly reduced. The right ventricular size is mildly enlarged. Tricuspid regurgitation signal is inadequate for assessing PA pressure.   3. The mitral valve was not well visualized. No evidence of mitral valve regurgitation. No evidence of mitral stenosis.   4. The aortic valve was not well visualized. Aortic valve regurgitation is not visualized. No aortic stenosis is present.      Neuro/Psych  PSYCHIATRIC DISORDERS Anxiety Depression       GI/Hepatic Neg liver ROS,GERD  ,,  Endo/Other  Hypothyroidism Hyperthyroidism   Renal/GU negative Renal ROS     Musculoskeletal negative musculoskeletal ROS (+)    Abdominal   Peds  Hematology negative hematology ROS (+)   Anesthesia Other Findings   Reproductive/Obstetrics                              Anesthesia Physical Anesthesia Plan  ASA: 4  Anesthesia Plan: General   Post-op Pain Management:    Induction: Intravenous  PONV Risk Score and Plan: 3 and Treatment may vary due to age or medical condition and Midazolam  Airway Management Planned: Oral ETT  Additional Equipment: Arterial line, CVP, PA Cath, TEE and Ultrasound Guidance Line Placement  Intra-op Plan: Utilization Of Total Body Hypothermia per surgeon request  Post-operative Plan: Post-operative intubation/ventilation  Informed Consent: I have reviewed the patients History and Physical, chart, labs and discussed the procedure including the risks, benefits and alternatives for the proposed anesthesia with the patient or authorized representative who has indicated his/her understanding and acceptance.     Dental advisory given  Plan Discussed with: CRNA  Anesthesia Plan Comments:         Anesthesia Quick Evaluation

## 2023-06-24 NOTE — Progress Notes (Signed)
Procedure(s) (LRB): CORONARY ARTERY BYPASS GRAFTING (CABG) (N/A) TRANSESOPHAGEAL ECHOCARDIOGRAM (N/A) Subjective: anxious  Objective: Vital signs in last 24 hours: Temp:  [97.6 F (36.4 C)-98.5 F (36.9 C)] 97.7 F (36.5 C) (06/30 0450) Pulse Rate:  [52-97] 62 (06/30 0841) Cardiac Rhythm: Sinus bradycardia (06/30 0700) Resp:  [18-20] 20 (06/30 0841) BP: (99-112)/(61-74) 112/72 (06/30 0841) SpO2:  [93 %-98 %] 96 % (06/30 0841)  Hemodynamic parameters for last 24 hours:    Intake/Output from previous day: 06/29 0701 - 06/30 0700 In: 240 [P.O.:240] Out: 2900 [Urine:2900] Intake/Output this shift: No intake/output data recorded.  General appearance: alert, cooperative, and no distress Neurologic: intact  Lab Results: Recent Labs    06/24/23 0028 06/24/23 0641  WBC 8.0 6.9  HGB 16.3 16.8  HCT 47.0 48.1  PLT 173 181   BMET:  Recent Labs    06/23/23 0158 06/24/23 0028  NA 136 139  K 3.8 4.2  CL 105 100  CO2 22 24  GLUCOSE 123* 119*  BUN 15 20  CREATININE 1.20 1.40*  CALCIUM 9.0 9.5    PT/INR:  Recent Labs    06/23/23 0716  LABPROT 13.3  INR 1.0   ABG    Component Value Date/Time   PHART 7.383 06/22/2023 0835   HCO3 24.9 06/22/2023 0842   TCO2 26 06/22/2023 0842   ACIDBASEDEF 1.0 06/22/2023 0842   O2SAT 71 06/22/2023 0842   CBG (last 3)  No results for input(s): "GLUCAP" in the last 72 hours.  Assessment/Plan: S/P Procedure(s) (LRB): CORONARY ARTERY BYPASS GRAFTING (CABG) (N/A) TRANSESOPHAGEAL ECHOCARDIOGRAM (N/A) For CABG tomorrow Am Plan LIMA to OM1, possible graft to RCA distribution if there is a graftable target   LOS: 2 days    Loreli Slot 06/24/2023

## 2023-06-25 ENCOUNTER — Inpatient Hospital Stay (HOSPITAL_COMMUNITY): Payer: BC Managed Care – PPO

## 2023-06-25 ENCOUNTER — Inpatient Hospital Stay (HOSPITAL_COMMUNITY): Payer: BC Managed Care – PPO | Admitting: Anesthesiology

## 2023-06-25 ENCOUNTER — Other Ambulatory Visit: Payer: Self-pay

## 2023-06-25 ENCOUNTER — Inpatient Hospital Stay (HOSPITAL_COMMUNITY)
Admission: RE | Disposition: A | Discharge status: Home / Self Care | Payer: Self-pay | Source: Ambulatory Visit | Attending: Thoracic Surgery (Cardiothoracic Vascular Surgery)

## 2023-06-25 ENCOUNTER — Encounter (HOSPITAL_COMMUNITY): Payer: Self-pay | Admitting: Internal Medicine

## 2023-06-25 DIAGNOSIS — Z951 Presence of aortocoronary bypass graft: Secondary | ICD-10-CM

## 2023-06-25 DIAGNOSIS — I200 Unstable angina: Secondary | ICD-10-CM

## 2023-06-25 DIAGNOSIS — I2510 Atherosclerotic heart disease of native coronary artery without angina pectoris: Secondary | ICD-10-CM

## 2023-06-25 HISTORY — PX: CORONARY ARTERY BYPASS GRAFT: SHX141

## 2023-06-25 HISTORY — DX: Presence of aortocoronary bypass graft: Z95.1

## 2023-06-25 HISTORY — PX: TEE WITHOUT CARDIOVERSION: SHX5443

## 2023-06-25 LAB — POCT I-STAT 7, (LYTES, BLD GAS, ICA,H+H)
Acid-base deficit: 4 mmol/L — ABNORMAL HIGH (ref 0.0–2.0)
Acid-base deficit: 1 mmol/L (ref 0.0–2.0)
Acid-base deficit: 3 mmol/L — ABNORMAL HIGH (ref 0.0–2.0)
Acid-base deficit: 4 mmol/L — ABNORMAL HIGH (ref 0.0–2.0)
Acid-base deficit: 3 mmol/L — ABNORMAL HIGH (ref 0.0–2.0)
Acid-base deficit: 5 mmol/L — ABNORMAL HIGH (ref 0.0–2.0)
Bicarbonate: 22.5 mmol/L (ref 20.0–28.0)
Bicarbonate: 23.9 mmol/L (ref 20.0–28.0)
Bicarbonate: 22.6 mmol/L (ref 20.0–28.0)
Bicarbonate: 21.7 mmol/L (ref 20.0–28.0)
Bicarbonate: 21.1 mmol/L (ref 20.0–28.0)
Bicarbonate: 21.4 mmol/L (ref 20.0–28.0)
Calcium, Ion: 1.27 mmol/L (ref 1.15–1.40)
Calcium, Ion: 0.94 mmol/L — ABNORMAL LOW (ref 1.15–1.40)
Calcium, Ion: 1.37 mmol/L (ref 1.15–1.40)
Calcium, Ion: 1.29 mmol/L (ref 1.15–1.40)
Calcium, Ion: 1.18 mmol/L (ref 1.15–1.40)
Calcium, Ion: 1.25 mmol/L (ref 1.15–1.40)
HCT: 44 % (ref 39.0–52.0)
HCT: 33 % — ABNORMAL LOW (ref 39.0–52.0)
HCT: 33 % — ABNORMAL LOW (ref 39.0–52.0)
HCT: 35 % — ABNORMAL LOW (ref 39.0–52.0)
HCT: 31 % — ABNORMAL LOW (ref 39.0–52.0)
HCT: 34 % — ABNORMAL LOW (ref 39.0–52.0)
Hemoglobin: 15 g/dL (ref 13.0–17.0)
Hemoglobin: 11.2 g/dL — ABNORMAL LOW (ref 13.0–17.0)
Hemoglobin: 11.2 g/dL — ABNORMAL LOW (ref 13.0–17.0)
Hemoglobin: 11.9 g/dL — ABNORMAL LOW (ref 13.0–17.0)
Hemoglobin: 10.5 g/dL — ABNORMAL LOW (ref 13.0–17.0)
Hemoglobin: 11.6 g/dL — ABNORMAL LOW (ref 13.0–17.0)
O2 Saturation: 100 %
O2 Saturation: 100 %
O2 Saturation: 97 %
O2 Saturation: 96 %
O2 Saturation: 98 %
O2 Saturation: 99 %
Patient temperature: 35.7
Patient temperature: 36.3
Patient temperature: 36.7
Potassium: 4.1 mmol/L (ref 3.5–5.1)
Potassium: 4.6 mmol/L (ref 3.5–5.1)
Potassium: 5.2 mmol/L — ABNORMAL HIGH (ref 3.5–5.1)
Potassium: 4.4 mmol/L (ref 3.5–5.1)
Potassium: 4.8 mmol/L (ref 3.5–5.1)
Potassium: 4.2 mmol/L (ref 3.5–5.1)
Sodium: 139 mmol/L (ref 135–145)
Sodium: 140 mmol/L (ref 135–145)
Sodium: 137 mmol/L (ref 135–145)
Sodium: 139 mmol/L (ref 135–145)
Sodium: 138 mmol/L (ref 135–145)
Sodium: 138 mmol/L (ref 135–145)
TCO2: 24 mmol/L (ref 22–32)
TCO2: 25 mmol/L (ref 22–32)
TCO2: 24 mmol/L (ref 22–32)
TCO2: 23 mmol/L (ref 22–32)
TCO2: 22 mmol/L (ref 22–32)
TCO2: 23 mmol/L (ref 22–32)
pCO2 arterial: 44.5 mmHg (ref 32–48)
pCO2 arterial: 38.4 mmHg (ref 32–48)
pCO2 arterial: 43.2 mmHg (ref 32–48)
pCO2 arterial: 39.2 mmHg (ref 32–48)
pCO2 arterial: 32.4 mmHg (ref 32–48)
pCO2 arterial: 44.6 mmHg (ref 32–48)
pH, Arterial: 7.313 — ABNORMAL LOW (ref 7.35–7.45)
pH, Arterial: 7.402 (ref 7.35–7.45)
pH, Arterial: 7.326 — ABNORMAL LOW (ref 7.35–7.45)
pH, Arterial: 7.345 — ABNORMAL LOW (ref 7.35–7.45)
pH, Arterial: 7.418 (ref 7.35–7.45)
pH, Arterial: 7.287 — ABNORMAL LOW (ref 7.35–7.45)
pO2, Arterial: 437 mmHg — ABNORMAL HIGH (ref 83–108)
pO2, Arterial: 396 mmHg — ABNORMAL HIGH (ref 83–108)
pO2, Arterial: 92 mmHg (ref 83–108)
pO2, Arterial: 82 mmHg — ABNORMAL LOW (ref 83–108)
pO2, Arterial: 107 mmHg (ref 83–108)
pO2, Arterial: 137 mmHg — ABNORMAL HIGH (ref 83–108)

## 2023-06-25 LAB — BPAM RBC
Blood Product Expiration Date: 202407182359
Blood Product Expiration Date: 202407182359
Blood Product Expiration Date: 202407182359
ISSUE DATE / TIME: 202407010819
Unit Type and Rh: 600

## 2023-06-25 LAB — TYPE AND SCREEN
ABO/RH(D): A NEG
Unit division: 0
Unit division: 0
Unit division: 0
Unit division: 0

## 2023-06-25 LAB — POCT I-STAT, CHEM 8
BUN: 15 mg/dL (ref 6–20)
BUN: 14 mg/dL (ref 6–20)
BUN: 12 mg/dL (ref 6–20)
BUN: 13 mg/dL (ref 6–20)
Calcium, Ion: 1.27 mmol/L (ref 1.15–1.40)
Calcium, Ion: 1.27 mmol/L (ref 1.15–1.40)
Calcium, Ion: 0.95 mmol/L — ABNORMAL LOW (ref 1.15–1.40)
Calcium, Ion: 1.36 mmol/L (ref 1.15–1.40)
Chloride: 104 mmol/L (ref 98–111)
Chloride: 105 mmol/L (ref 98–111)
Chloride: 101 mmol/L (ref 98–111)
Chloride: 105 mmol/L (ref 98–111)
Creatinine, Ser: 1.1 mg/dL (ref 0.61–1.24)
Creatinine, Ser: 1.2 mg/dL (ref 0.61–1.24)
Creatinine, Ser: 1 mg/dL (ref 0.61–1.24)
Creatinine, Ser: 1.1 mg/dL (ref 0.61–1.24)
Glucose, Bld: 108 mg/dL — ABNORMAL HIGH (ref 70–99)
Glucose, Bld: 105 mg/dL — ABNORMAL HIGH (ref 70–99)
Glucose, Bld: 94 mg/dL (ref 70–99)
Glucose, Bld: 140 mg/dL — ABNORMAL HIGH (ref 70–99)
HCT: 42 % (ref 39.0–52.0)
HCT: 42 % (ref 39.0–52.0)
HCT: 33 % — ABNORMAL LOW (ref 39.0–52.0)
HCT: 33 % — ABNORMAL LOW (ref 39.0–52.0)
Hemoglobin: 14.3 g/dL (ref 13.0–17.0)
Hemoglobin: 14.3 g/dL (ref 13.0–17.0)
Hemoglobin: 11.2 g/dL — ABNORMAL LOW (ref 13.0–17.0)
Hemoglobin: 11.2 g/dL — ABNORMAL LOW (ref 13.0–17.0)
Potassium: 4.1 mmol/L (ref 3.5–5.1)
Potassium: 4.5 mmol/L (ref 3.5–5.1)
Potassium: 4.6 mmol/L (ref 3.5–5.1)
Potassium: 5.2 mmol/L — ABNORMAL HIGH (ref 3.5–5.1)
Sodium: 139 mmol/L (ref 135–145)
Sodium: 139 mmol/L (ref 135–145)
Sodium: 140 mmol/L (ref 135–145)
Sodium: 137 mmol/L (ref 135–145)
TCO2: 29 mmol/L (ref 22–32)
TCO2: 25 mmol/L (ref 22–32)
TCO2: 26 mmol/L (ref 22–32)
TCO2: 23 mmol/L (ref 22–32)

## 2023-06-25 LAB — CBC
HCT: 45.9 % (ref 39.0–52.0)
HCT: 36.3 % — ABNORMAL LOW (ref 39.0–52.0)
HCT: 34.1 % — ABNORMAL LOW (ref 39.0–52.0)
Hemoglobin: 15.8 g/dL (ref 13.0–17.0)
Hemoglobin: 12.4 g/dL — ABNORMAL LOW (ref 13.0–17.0)
Hemoglobin: 11.8 g/dL — ABNORMAL LOW (ref 13.0–17.0)
MCH: 32.5 pg (ref 26.0–34.0)
MCH: 32.5 pg (ref 26.0–34.0)
MCH: 32.8 pg (ref 26.0–34.0)
MCHC: 34.4 g/dL (ref 30.0–36.0)
MCHC: 34.2 g/dL (ref 30.0–36.0)
MCHC: 34.6 g/dL (ref 30.0–36.0)
MCV: 94.4 fL (ref 80.0–100.0)
MCV: 95.3 fL (ref 80.0–100.0)
MCV: 94.7 fL (ref 80.0–100.0)
Platelets: 181 10*3/uL (ref 150–400)
Platelets: 113 10*3/uL — ABNORMAL LOW (ref 150–400)
Platelets: 122 10*3/uL — ABNORMAL LOW (ref 150–400)
RBC: 4.86 MIL/uL (ref 4.22–5.81)
RBC: 3.81 MIL/uL — ABNORMAL LOW (ref 4.22–5.81)
RBC: 3.6 MIL/uL — ABNORMAL LOW (ref 4.22–5.81)
RDW: 13.6 % (ref 11.5–15.5)
RDW: 13.5 % (ref 11.5–15.5)
RDW: 13.6 % (ref 11.5–15.5)
WBC: 7.3 10*3/uL (ref 4.0–10.5)
WBC: 9.7 10*3/uL (ref 4.0–10.5)
WBC: 12 10*3/uL — ABNORMAL HIGH (ref 4.0–10.5)
nRBC: 0 % (ref 0.0–0.2)
nRBC: 0 % (ref 0.0–0.2)
nRBC: 0 % (ref 0.0–0.2)

## 2023-06-25 LAB — GLUCOSE, CAPILLARY
Glucose-Capillary: 110 mg/dL — ABNORMAL HIGH (ref 70–99)
Glucose-Capillary: 113 mg/dL — ABNORMAL HIGH (ref 70–99)
Glucose-Capillary: 115 mg/dL — ABNORMAL HIGH (ref 70–99)
Glucose-Capillary: 119 mg/dL — ABNORMAL HIGH (ref 70–99)
Glucose-Capillary: 120 mg/dL — ABNORMAL HIGH (ref 70–99)
Glucose-Capillary: 122 mg/dL — ABNORMAL HIGH (ref 70–99)
Glucose-Capillary: 123 mg/dL — ABNORMAL HIGH (ref 70–99)
Glucose-Capillary: 133 mg/dL — ABNORMAL HIGH (ref 70–99)
Glucose-Capillary: 138 mg/dL — ABNORMAL HIGH (ref 70–99)
Glucose-Capillary: 155 mg/dL — ABNORMAL HIGH (ref 70–99)
Glucose-Capillary: 74 mg/dL (ref 70–99)
Glucose-Capillary: 77 mg/dL (ref 70–99)

## 2023-06-25 LAB — PROTIME-INR
INR: 1.2 (ref 0.8–1.2)
Prothrombin Time: 15.9 seconds — ABNORMAL HIGH (ref 11.4–15.2)

## 2023-06-25 LAB — MAGNESIUM: Magnesium: 2.7 mg/dL — ABNORMAL HIGH (ref 1.7–2.4)

## 2023-06-25 LAB — HEMOGLOBIN AND HEMATOCRIT, BLOOD
HCT: 32 % — ABNORMAL LOW (ref 39.0–52.0)
Hemoglobin: 11 g/dL — ABNORMAL LOW (ref 13.0–17.0)

## 2023-06-25 LAB — BASIC METABOLIC PANEL
Anion gap: 8 (ref 5–15)
BUN: 13 mg/dL (ref 6–20)
CO2: 23 mmol/L (ref 22–32)
Calcium: 8.2 mg/dL — ABNORMAL LOW (ref 8.9–10.3)
Chloride: 106 mmol/L (ref 98–111)
Creatinine, Ser: 1.13 mg/dL (ref 0.61–1.24)
GFR, Estimated: >60 mL/min (ref >60)
Glucose, Bld: 155 mg/dL — ABNORMAL HIGH (ref 70–99)
Potassium: 4 mmol/L (ref 3.5–5.1)
Sodium: 137 mmol/L (ref 135–145)

## 2023-06-25 LAB — PLATELET COUNT: Platelets: 131 10*3/uL — ABNORMAL LOW (ref 150–400)

## 2023-06-25 LAB — PREPARE RBC (CROSSMATCH)

## 2023-06-25 LAB — HEMOGLOBIN A1C
Hgb A1c MFr Bld: 6.4 % — ABNORMAL HIGH (ref 4.8–5.6)
Mean Plasma Glucose: 137 mg/dL

## 2023-06-25 LAB — ECHO INTRAOPERATIVE TEE
Height: 73 in
Weight: 3416 oz

## 2023-06-25 LAB — ABO/RH: ABO/RH(D): A NEG

## 2023-06-25 LAB — HEPARIN LEVEL (UNFRACTIONATED): Heparin Unfractionated: 0.56 IU/mL (ref 0.30–0.70)

## 2023-06-25 LAB — APTT: aPTT: 31 seconds (ref 24–36)

## 2023-06-25 SURGERY — CORONARY ARTERY BYPASS GRAFTING (CABG)
Anesthesia: General | Site: Chest

## 2023-06-25 MED ORDER — AMIODARONE IV BOLUS ONLY 150 MG/100ML
INTRAVENOUS | Status: DC | PRN
  Administered 2023-06-25: 150 mg via INTRAVENOUS

## 2023-06-25 MED ORDER — LACTATED RINGERS IV SOLN: INTRAVENOUS | Status: DC | PRN

## 2023-06-25 MED ORDER — PHENYLEPHRINE 80 MCG/ML (10ML) SYRINGE FOR IV PUSH (FOR BLOOD PRESSURE SUPPORT)
PREFILLED_SYRINGE | INTRAVENOUS | Status: AC
  Filled 2023-06-25: qty 10

## 2023-06-25 MED ORDER — ONDANSETRON HCL 4 MG/2ML IJ SOLN
4.0000 mg | Freq: Four times a day (QID) | INTRAMUSCULAR | Status: DC | PRN
  Administered 2023-06-25 – 2023-06-27 (×4): 4 mg via INTRAVENOUS
  Filled 2023-06-25 (×5): qty 2

## 2023-06-25 MED ORDER — PLASMA-LYTE A IV SOLN: INTRAVENOUS | Status: DC | PRN

## 2023-06-25 MED ORDER — SODIUM CHLORIDE 0.9 % IV SOLN: INTRAVENOUS | Status: DC | PRN

## 2023-06-25 MED ORDER — AMIODARONE HCL IN DEXTROSE 360-4.14 MG/200ML-% IV SOLN
INTRAVENOUS | Status: DC | PRN
  Administered 2023-06-25: 60 mg/h via INTRAVENOUS

## 2023-06-25 MED ORDER — ROCURONIUM BROMIDE 10 MG/ML (PF) SYRINGE
PREFILLED_SYRINGE | INTRAVENOUS | Status: AC
  Filled 2023-06-25: qty 20

## 2023-06-25 MED ORDER — VASOPRESSIN 20 UNIT/ML IV SOLN
INTRAVENOUS | Status: AC
  Filled 2023-06-25: qty 1

## 2023-06-25 MED ORDER — PANTOPRAZOLE SODIUM 40 MG PO TBEC
40.0000 mg | DELAYED_RELEASE_TABLET | Freq: Every day | ORAL | Status: DC
  Administered 2023-06-27 – 2023-06-29 (×3): 40 mg via ORAL
  Filled 2023-06-25 (×3): qty 1

## 2023-06-25 MED ORDER — POTASSIUM CHLORIDE 10 MEQ/50ML IV SOLN: 10.0000 meq | INTRAVENOUS | Status: AC

## 2023-06-25 MED ORDER — MIDAZOLAM HCL (PF) 10 MG/2ML IJ SOLN
INTRAMUSCULAR | Status: AC
  Filled 2023-06-25: qty 2

## 2023-06-25 MED ORDER — ARTIFICIAL TEARS OPHTHALMIC OINT
TOPICAL_OINTMENT | OPHTHALMIC | Status: AC
  Filled 2023-06-25: qty 3.5

## 2023-06-25 MED ORDER — PROPOFOL 10 MG/ML IV BOLUS
INTRAVENOUS | Status: AC
  Filled 2023-06-25: qty 20

## 2023-06-25 MED ORDER — CHLORHEXIDINE GLUCONATE CLOTH 2 % EX PADS
6.0000 | MEDICATED_PAD | Freq: Every day | CUTANEOUS | Status: DC
  Administered 2023-06-25 – 2023-06-28 (×5): 6 via TOPICAL

## 2023-06-25 MED ORDER — FENTANYL CITRATE (PF) 250 MCG/5ML IJ SOLN
INTRAMUSCULAR | Status: AC
  Filled 2023-06-25: qty 5

## 2023-06-25 MED ORDER — MAGNESIUM SULFATE 4 GM/100ML IV SOLN
4.0000 g | Freq: Once | INTRAVENOUS | Status: AC
  Administered 2023-06-25: 4 g via INTRAVENOUS
  Filled 2023-06-25: qty 100

## 2023-06-25 MED ORDER — ACETAMINOPHEN 160 MG/5ML PO SOLN
650.0000 mg | Freq: Once | ORAL | Status: AC
  Administered 2023-06-25: 650 mg

## 2023-06-25 MED ORDER — BISACODYL 5 MG PO TBEC
10.0000 mg | DELAYED_RELEASE_TABLET | Freq: Every day | ORAL | Status: DC
  Administered 2023-06-26 – 2023-06-27 (×2): 10 mg via ORAL
  Filled 2023-06-25 (×2): qty 2

## 2023-06-25 MED ORDER — PHENYLEPHRINE 80 MCG/ML (10ML) SYRINGE FOR IV PUSH (FOR BLOOD PRESSURE SUPPORT)
PREFILLED_SYRINGE | INTRAVENOUS | Status: DC | PRN
  Administered 2023-06-25 (×3): 80 ug via INTRAVENOUS

## 2023-06-25 MED ORDER — PROTAMINE SULFATE 10 MG/ML IV SOLN
INTRAVENOUS | Status: AC
  Filled 2023-06-25: qty 10

## 2023-06-25 MED ORDER — SODIUM BICARBONATE 8.4 % IV SOLN
50.0000 meq | Freq: Once | INTRAVENOUS | Status: AC
  Administered 2023-06-25: 50 meq via INTRAVENOUS

## 2023-06-25 MED ORDER — PROPOFOL 10 MG/ML IV BOLUS
INTRAVENOUS | Status: DC | PRN
  Administered 2023-06-25: 50 mg via INTRAVENOUS

## 2023-06-25 MED ORDER — METOPROLOL TARTRATE 25 MG/10 ML ORAL SUSPENSION: 12.5000 mg | Freq: Two times a day (BID) | ORAL | Status: DC

## 2023-06-25 MED ORDER — HEPARIN SODIUM (PORCINE) 1000 UNIT/ML IJ SOLN
INTRAMUSCULAR | Status: AC
  Filled 2023-06-25: qty 10

## 2023-06-25 MED ORDER — ATORVASTATIN CALCIUM 80 MG PO TABS
80.0000 mg | ORAL_TABLET | Freq: Every day | ORAL | Status: DC
  Administered 2023-06-26 – 2023-06-29 (×4): 80 mg via ORAL
  Filled 2023-06-25 (×4): qty 1

## 2023-06-25 MED ORDER — DEXTROSE 50 % IV SOLN: 0.0000 mL | INTRAVENOUS | Status: DC | PRN

## 2023-06-25 MED ORDER — PROTAMINE SULFATE 10 MG/ML IV SOLN
INTRAVENOUS | Status: DC | PRN
  Administered 2023-06-25: 300 mg via INTRAVENOUS

## 2023-06-25 MED ORDER — EPHEDRINE SULFATE-NACL 50-0.9 MG/10ML-% IV SOSY
PREFILLED_SYRINGE | INTRAVENOUS | Status: DC | PRN
  Administered 2023-06-25 (×2): 5 mg via INTRAVENOUS

## 2023-06-25 MED ORDER — 0.9 % SODIUM CHLORIDE (POUR BTL) OPTIME
TOPICAL | Status: DC | PRN
  Administered 2023-06-25: 5000 mL

## 2023-06-25 MED ORDER — SODIUM CHLORIDE 0.9% FLUSH
3.0000 mL | Freq: Two times a day (BID) | INTRAVENOUS | Status: DC
  Administered 2023-06-26 (×2): 3 mL via INTRAVENOUS

## 2023-06-25 MED ORDER — MIDAZOLAM HCL 2 MG/2ML IJ SOLN
INTRAMUSCULAR | Status: DC | PRN
  Administered 2023-06-25: 2 mg via INTRAVENOUS

## 2023-06-25 MED ORDER — SODIUM CHLORIDE 0.9 % IV SOLN
250.0000 mL | INTRAVENOUS | Status: DC
  Administered 2023-06-26: 250 mL via INTRAVENOUS

## 2023-06-25 MED ORDER — SODIUM CHLORIDE 0.9 % IV SOLN: INTRAVENOUS | Status: DC

## 2023-06-25 MED ORDER — MORPHINE SULFATE (PF) 2 MG/ML IV SOLN
1.0000 mg | INTRAVENOUS | Status: DC | PRN
  Administered 2023-06-25 (×2): 2 mg via INTRAVENOUS
  Administered 2023-06-25: 4 mg via INTRAVENOUS
  Administered 2023-06-25 – 2023-06-26 (×4): 2 mg via INTRAVENOUS
  Administered 2023-06-26: 4 mg via INTRAVENOUS
  Filled 2023-06-25 (×4): qty 1
  Filled 2023-06-25 (×3): qty 2
  Filled 2023-06-25: qty 1

## 2023-06-25 MED ORDER — LIDOCAINE 2% (20 MG/ML) 5 ML SYRINGE
INTRAMUSCULAR | Status: AC
  Filled 2023-06-25: qty 5

## 2023-06-25 MED ORDER — METOPROLOL TARTRATE 5 MG/5ML IV SOLN: 2.5000 mg | INTRAVENOUS | Status: DC | PRN

## 2023-06-25 MED ORDER — METOCLOPRAMIDE HCL 5 MG/ML IJ SOLN
10.0000 mg | Freq: Four times a day (QID) | INTRAMUSCULAR | Status: AC
  Administered 2023-06-25 – 2023-06-26 (×6): 10 mg via INTRAVENOUS
  Filled 2023-06-25 (×6): qty 2

## 2023-06-25 MED ORDER — HEPARIN SODIUM (PORCINE) 1000 UNIT/ML IJ SOLN
INTRAMUSCULAR | Status: AC
  Filled 2023-06-25: qty 1

## 2023-06-25 MED ORDER — TRAMADOL HCL 50 MG PO TABS
50.0000 mg | ORAL_TABLET | ORAL | Status: DC | PRN
  Administered 2023-06-25 (×2): 50 mg via ORAL
  Administered 2023-06-26 (×3): 100 mg via ORAL
  Filled 2023-06-25: qty 2
  Filled 2023-06-25: qty 1
  Filled 2023-06-25 (×4): qty 2

## 2023-06-25 MED ORDER — CEFAZOLIN SODIUM-DEXTROSE 2-4 GM/100ML-% IV SOLN
2.0000 g | Freq: Three times a day (TID) | INTRAVENOUS | Status: AC
  Administered 2023-06-25 – 2023-06-27 (×6): 2 g via INTRAVENOUS
  Filled 2023-06-25 (×5): qty 100

## 2023-06-25 MED ORDER — ASPIRIN 325 MG PO TBEC
325.0000 mg | DELAYED_RELEASE_TABLET | Freq: Every day | ORAL | Status: DC
  Administered 2023-06-26 – 2023-06-28 (×3): 325 mg via ORAL
  Filled 2023-06-25 (×3): qty 1

## 2023-06-25 MED ORDER — ARTIFICIAL TEARS OPHTHALMIC OINT
TOPICAL_OINTMENT | OPHTHALMIC | Status: DC | PRN
  Administered 2023-06-25: 1 via OPHTHALMIC

## 2023-06-25 MED ORDER — INSULIN REGULAR(HUMAN) IN NACL 100-0.9 UT/100ML-% IV SOLN
INTRAVENOUS | Status: DC
  Administered 2023-06-25: 1.8 [IU]/h via INTRAVENOUS

## 2023-06-25 MED ORDER — FENTANYL CITRATE (PF) 250 MCG/5ML IJ SOLN
INTRAMUSCULAR | Status: DC | PRN
  Administered 2023-06-25 (×2): 50 ug via INTRAVENOUS
  Administered 2023-06-25: 250 ug via INTRAVENOUS
  Administered 2023-06-25 (×2): 100 ug via INTRAVENOUS
  Administered 2023-06-25: 300 ug via INTRAVENOUS
  Administered 2023-06-25: 50 ug via INTRAVENOUS
  Administered 2023-06-25: 250 ug via INTRAVENOUS
  Administered 2023-06-25 (×2): 50 ug via INTRAVENOUS

## 2023-06-25 MED ORDER — ALBUMIN HUMAN 5 % IV SOLN
250.0000 mL | INTRAVENOUS | Status: DC | PRN
  Administered 2023-06-25 (×3): 12.5 g via INTRAVENOUS
  Filled 2023-06-25: qty 250

## 2023-06-25 MED ORDER — LACTATED RINGERS IV SOLN: INTRAVENOUS | Status: DC

## 2023-06-25 MED ORDER — AMIODARONE HCL IN DEXTROSE 360-4.14 MG/200ML-% IV SOLN
30.0000 mg/h | INTRAVENOUS | Status: DC
  Administered 2023-06-25 – 2023-06-27 (×4): 30 mg/h via INTRAVENOUS
  Filled 2023-06-25 (×4): qty 200

## 2023-06-25 MED ORDER — MIDAZOLAM HCL 2 MG/2ML IJ SOLN: 2.0000 mg | INTRAMUSCULAR | Status: DC | PRN

## 2023-06-25 MED ORDER — BISACODYL 10 MG RE SUPP: 10.0000 mg | Freq: Every day | RECTAL | Status: DC

## 2023-06-25 MED ORDER — PROTAMINE SULFATE 10 MG/ML IV SOLN
INTRAVENOUS | Status: AC
  Filled 2023-06-25: qty 25

## 2023-06-25 MED ORDER — MIDAZOLAM HCL 2 MG/2ML IJ SOLN
INTRAMUSCULAR | Status: AC
  Filled 2023-06-25: qty 2

## 2023-06-25 MED ORDER — ASPIRIN 81 MG PO CHEW
324.0000 mg | CHEWABLE_TABLET | Freq: Once | ORAL | Status: AC
  Administered 2023-06-25: 324 mg via ORAL
  Filled 2023-06-25: qty 4

## 2023-06-25 MED ORDER — ORAL CARE MOUTH RINSE: 15.0000 mL | OROMUCOSAL | Status: DC | PRN

## 2023-06-25 MED ORDER — ASPIRIN 81 MG PO CHEW: 324.0000 mg | CHEWABLE_TABLET | Freq: Every day | ORAL | Status: DC

## 2023-06-25 MED ORDER — SODIUM CHLORIDE 0.9% FLUSH: 3.0000 mL | INTRAVENOUS | Status: DC | PRN

## 2023-06-25 MED ORDER — CHLORHEXIDINE GLUCONATE 0.12 % MT SOLN
15.0000 mL | OROMUCOSAL | Status: AC
  Administered 2023-06-25: 15 mL via OROMUCOSAL
  Filled 2023-06-25: qty 15

## 2023-06-25 MED ORDER — ALBUMIN HUMAN 5 % IV SOLN: INTRAVENOUS | Status: DC | PRN

## 2023-06-25 MED ORDER — ROCURONIUM BROMIDE 10 MG/ML (PF) SYRINGE
PREFILLED_SYRINGE | INTRAVENOUS | Status: DC | PRN
  Administered 2023-06-25 (×2): 50 mg via INTRAVENOUS
  Administered 2023-06-25: 100 mg via INTRAVENOUS
  Administered 2023-06-25: 50 mg via INTRAVENOUS

## 2023-06-25 MED ORDER — ACETAMINOPHEN 160 MG/5ML PO SOLN: 1000.0000 mg | Freq: Four times a day (QID) | ORAL | Status: DC

## 2023-06-25 MED ORDER — ORAL CARE MOUTH RINSE
15.0000 mL | OROMUCOSAL | Status: DC
  Administered 2023-06-25 (×2): 15 mL via OROMUCOSAL

## 2023-06-25 MED ORDER — DOCUSATE SODIUM 100 MG PO CAPS
200.0000 mg | ORAL_CAPSULE | Freq: Every day | ORAL | Status: DC
  Administered 2023-06-26 – 2023-06-27 (×2): 200 mg via ORAL
  Filled 2023-06-25 (×3): qty 2

## 2023-06-25 MED ORDER — SODIUM CHLORIDE (PF) 0.9 % IJ SOLN: OROMUCOSAL | Status: DC | PRN

## 2023-06-25 MED ORDER — PHENYLEPHRINE HCL-NACL 20-0.9 MG/250ML-% IV SOLN: 0.0000 ug/min | INTRAVENOUS | Status: DC

## 2023-06-25 MED ORDER — HEMOSTATIC AGENTS (NO CHARGE) OPTIME
TOPICAL | Status: DC | PRN
  Administered 2023-06-25: 1 via TOPICAL

## 2023-06-25 MED ORDER — EPHEDRINE 5 MG/ML INJ
INTRAVENOUS | Status: AC
  Filled 2023-06-25: qty 5

## 2023-06-25 MED ORDER — VANCOMYCIN HCL IN DEXTROSE 1-5 GM/200ML-% IV SOLN
1000.0000 mg | Freq: Once | INTRAVENOUS | Status: AC
  Administered 2023-06-25: 1000 mg via INTRAVENOUS
  Filled 2023-06-25: qty 200

## 2023-06-25 MED ORDER — DEXMEDETOMIDINE HCL IN NACL 400 MCG/100ML IV SOLN
0.0000 ug/kg/h | INTRAVENOUS | Status: DC
  Administered 2023-06-25: 0.7 ug/kg/h via INTRAVENOUS

## 2023-06-25 MED ORDER — ACETAMINOPHEN 500 MG PO TABS
1000.0000 mg | ORAL_TABLET | Freq: Four times a day (QID) | ORAL | Status: DC
  Administered 2023-06-25 – 2023-06-29 (×13): 1000 mg via ORAL
  Filled 2023-06-25 (×13): qty 2

## 2023-06-25 MED ORDER — PANTOPRAZOLE SODIUM 40 MG IV SOLR
40.0000 mg | Freq: Every day | INTRAVENOUS | Status: AC
  Administered 2023-06-25 – 2023-06-26 (×2): 40 mg via INTRAVENOUS
  Filled 2023-06-25 (×2): qty 10

## 2023-06-25 MED ORDER — AMIODARONE HCL IN DEXTROSE 360-4.14 MG/200ML-% IV SOLN
60.0000 mg/h | INTRAVENOUS | Status: AC
  Administered 2023-06-25 (×2): 60 mg/h via INTRAVENOUS
  Filled 2023-06-25: qty 200

## 2023-06-25 MED ORDER — HEPARIN SODIUM (PORCINE) 1000 UNIT/ML IJ SOLN
INTRAMUSCULAR | Status: DC | PRN
  Administered 2023-06-25: 34000 [IU] via INTRAVENOUS

## 2023-06-25 MED ORDER — METOPROLOL TARTRATE 12.5 MG HALF TABLET
12.5000 mg | ORAL_TABLET | Freq: Two times a day (BID) | ORAL | Status: DC
  Administered 2023-06-26: 12.5 mg via ORAL
  Filled 2023-06-25 (×2): qty 1

## 2023-06-25 MED ORDER — OXYCODONE HCL 5 MG PO TABS
5.0000 mg | ORAL_TABLET | ORAL | Status: DC | PRN
  Administered 2023-06-25: 10 mg via ORAL
  Administered 2023-06-25 (×2): 5 mg via ORAL
  Administered 2023-06-26 (×2): 10 mg via ORAL
  Filled 2023-06-25 (×2): qty 2
  Filled 2023-06-25: qty 1
  Filled 2023-06-25: qty 2
  Filled 2023-06-25 (×2): qty 1

## 2023-06-25 MED ORDER — MIDAZOLAM HCL (PF) 5 MG/ML IJ SOLN
INTRAMUSCULAR | Status: DC | PRN
  Administered 2023-06-25: 1 mg via INTRAVENOUS
  Administered 2023-06-25 (×2): 2 mg via INTRAVENOUS
  Administered 2023-06-25: 5 mg via INTRAVENOUS

## 2023-06-25 MED ORDER — LEVOTHYROXINE SODIUM 25 MCG PO TABS
137.0000 ug | ORAL_TABLET | Freq: Every day | ORAL | Status: DC
  Administered 2023-06-26 – 2023-06-29 (×4): 137 ug via ORAL
  Filled 2023-06-25 (×4): qty 1

## 2023-06-25 MED ORDER — SODIUM CHLORIDE 0.45 % IV SOLN: INTRAVENOUS | Status: DC | PRN

## 2023-06-25 SURGICAL SUPPLY — 89 items
BAG DECANTER FOR FLEXI CONT (MISCELLANEOUS) ×2 IMPLANT
BLADE CLIPPER SURG (BLADE) ×2 IMPLANT
BLADE STERNUM SYSTEM 6 (BLADE) ×2 IMPLANT
BNDG ELASTIC 4X5.8 VLCR STR LF (GAUZE/BANDAGES/DRESSINGS) ×2 IMPLANT
BNDG ELASTIC 6X5.8 VLCR STR LF (GAUZE/BANDAGES/DRESSINGS) ×2 IMPLANT
BNDG GAUZE DERMACEA FLUFF 4 (GAUZE/BANDAGES/DRESSINGS) ×2 IMPLANT
BNDG GZE DERMACEA 4 6PLY (GAUZE/BANDAGES/DRESSINGS) ×2
CANISTER SUCT 3000ML PPV (MISCELLANEOUS) ×2 IMPLANT
CANNULA EZ GLIDE AORTIC 21FR (CANNULA) ×2 IMPLANT
CANNULA MC2 2 STG 36/46 NON-V (CANNULA) IMPLANT
CATH CPB KIT HENDRICKSON (MISCELLANEOUS) ×2 IMPLANT
CATH ROBINSON RED A/P 18FR (CATHETERS) ×2 IMPLANT
CATH THORACIC 36FR (CATHETERS) ×2 IMPLANT
CATH THORACIC 36FR RT ANG (CATHETERS) ×2 IMPLANT
CLIP TI MEDIUM 24 (CLIP) IMPLANT
CLIP TI WIDE RED SMALL 24 (CLIP) IMPLANT
CONTAINER PROTECT SURGISLUSH (MISCELLANEOUS) ×4 IMPLANT
DRAPE CARDIOVASCULAR INCISE (DRAPES) ×2
DRAPE SRG 135X102X78XABS (DRAPES) ×2 IMPLANT
DRAPE WARM FLUID 44X44 (DRAPES) ×2 IMPLANT
DRSG COVADERM 4X14 (GAUZE/BANDAGES/DRESSINGS) ×2 IMPLANT
ELECT REM PT RETURN 9FT ADLT (ELECTROSURGICAL) ×4
ELECTRODE REM PT RTRN 9FT ADLT (ELECTROSURGICAL) ×4 IMPLANT
FELT TEFLON 1X6 (MISCELLANEOUS) ×4 IMPLANT
GAUZE 4X4 16PLY ~~LOC~~+RFID DBL (SPONGE) ×2 IMPLANT
GAUZE SPONGE 4X4 12PLY STRL (GAUZE/BANDAGES/DRESSINGS) ×4 IMPLANT
GAUZE SPONGE 4X4 12PLY STRL LF (GAUZE/BANDAGES/DRESSINGS) IMPLANT
GLOVE BIOGEL PI IND STRL 6.5 (GLOVE) IMPLANT
GLOVE SS BIOGEL STRL SZ 7.5 (GLOVE) ×2 IMPLANT
GLOVE SURG SIGNA 7.5 PF LTX (GLOVE) ×6 IMPLANT
GLOVE SURG SS PI 7.5 STRL IVOR (GLOVE) IMPLANT
GOWN STRL REUS W/ TWL LRG LVL3 (GOWN DISPOSABLE) ×8 IMPLANT
GOWN STRL REUS W/ TWL XL LVL3 (GOWN DISPOSABLE) ×4 IMPLANT
GOWN STRL REUS W/TWL LRG LVL3 (GOWN DISPOSABLE) ×8
GOWN STRL REUS W/TWL XL LVL3 (GOWN DISPOSABLE) ×4
HEMOSTAT POWDER SURGIFOAM 1G (HEMOSTASIS) ×6 IMPLANT
HEMOSTAT SURGICEL 2X14 (HEMOSTASIS) ×2 IMPLANT
INSERT FOGARTY XLG (MISCELLANEOUS) IMPLANT
KIT BASIN OR (CUSTOM PROCEDURE TRAY) ×2 IMPLANT
KIT SUCTION CATH 14FR (SUCTIONS) ×4 IMPLANT
KIT TURNOVER KIT B (KITS) ×2 IMPLANT
KIT VASOVIEW HEMOPRO 2 VH 4000 (KITS) ×2 IMPLANT
MARKER GRAFT CORONARY BYPASS (MISCELLANEOUS) ×6 IMPLANT
NS IRRIG 1000ML POUR BTL (IV SOLUTION) ×10 IMPLANT
PACK E OPEN HEART (SUTURE) ×2 IMPLANT
PACK OPEN HEART (CUSTOM PROCEDURE TRAY) ×2 IMPLANT
PAD ARMBOARD 7.5X6 YLW CONV (MISCELLANEOUS) ×4 IMPLANT
PAD ELECT DEFIB RADIOL ZOLL (MISCELLANEOUS) ×2 IMPLANT
PENCIL BUTTON HOLSTER BLD 10FT (ELECTRODE) ×2 IMPLANT
POSITIONER HEAD DONUT 9IN (MISCELLANEOUS) ×2 IMPLANT
PUNCH AORTIC ROTATE 4.0MM (MISCELLANEOUS) IMPLANT
PUNCH AORTIC ROTATE 4.5MM 8IN (MISCELLANEOUS) IMPLANT
PUNCH AORTIC ROTATE 5MM 8IN (MISCELLANEOUS) IMPLANT
SET MPS 3-ND DEL (MISCELLANEOUS) IMPLANT
SPONGE T-LAP 18X18 ~~LOC~~+RFID (SPONGE) ×8 IMPLANT
SPONGE T-LAP 4X18 ~~LOC~~+RFID (SPONGE) ×2 IMPLANT
SUPPORT HEART JANKE-BARRON (MISCELLANEOUS) ×2 IMPLANT
SUT BONE WAX W31G (SUTURE) ×2 IMPLANT
SUT MNCRL AB 4-0 PS2 18 (SUTURE) IMPLANT
SUT PROLENE 3 0 SH DA (SUTURE) ×2 IMPLANT
SUT PROLENE 4 0 RB 1 (SUTURE) ×6
SUT PROLENE 4 0 SH DA (SUTURE) IMPLANT
SUT PROLENE 4-0 RB1 .5 CRCL 36 (SUTURE) IMPLANT
SUT PROLENE 5 0 C 1 36 (SUTURE) IMPLANT
SUT PROLENE 6 0 C 1 30 (SUTURE) ×4 IMPLANT
SUT PROLENE 7 0 BV1 MDA (SUTURE) ×2 IMPLANT
SUT PROLENE 8 0 BV175 6 (SUTURE) IMPLANT
SUT STEEL 6MS V (SUTURE) ×2 IMPLANT
SUT STEEL STERNAL CCS#1 18IN (SUTURE) IMPLANT
SUT STEEL SZ 6 DBL 3X14 BALL (SUTURE) ×2 IMPLANT
SUT TEM PAC WIRE 2 0 SH (SUTURE) IMPLANT
SUT VIC AB 1 CTX 36 (SUTURE) ×4
SUT VIC AB 1 CTX36XBRD ANBCTR (SUTURE) ×4 IMPLANT
SUT VIC AB 2-0 CT1 27 (SUTURE)
SUT VIC AB 2-0 CT1 TAPERPNT 27 (SUTURE) IMPLANT
SUT VIC AB 2-0 CTX 27 (SUTURE) IMPLANT
SUT VIC AB 3-0 SH 27 (SUTURE)
SUT VIC AB 3-0 SH 27X BRD (SUTURE) IMPLANT
SUT VIC AB 3-0 X1 27 (SUTURE) IMPLANT
SUT VICRYL 4-0 PS2 18IN ABS (SUTURE) IMPLANT
SYSTEM SAHARA CHEST DRAIN ATS (WOUND CARE) ×2 IMPLANT
TAPE CLOTH SURG 4X10 WHT LF (GAUZE/BANDAGES/DRESSINGS) IMPLANT
TAPE PAPER 2X10 WHT MICROPORE (GAUZE/BANDAGES/DRESSINGS) IMPLANT
TOWEL GREEN STERILE (TOWEL DISPOSABLE) ×2 IMPLANT
TOWEL GREEN STERILE FF (TOWEL DISPOSABLE) ×2 IMPLANT
TRAY FOLEY SLVR 16FR TEMP STAT (SET/KITS/TRAYS/PACK) ×2 IMPLANT
TUBING LAP HI FLOW INSUFFLATIO (TUBING) ×2 IMPLANT
UNDERPAD 30X36 HEAVY ABSORB (UNDERPADS AND DIAPERS) ×2 IMPLANT
WATER STERILE IRR 1000ML POUR (IV SOLUTION) ×4 IMPLANT

## 2023-06-25 NOTE — Anesthesia Procedure Notes (Signed)
Central Venous Catheter Insertion Performed by: Lewie Loron, MD, anesthesiologist Start/End7/12/2022 6:55 AM, 06/25/2023 7:05 AM Patient location: Pre-op. Preanesthetic checklist: patient identified, IV checked, site marked, risks and benefits discussed, surgical consent, monitors and equipment checked, pre-op evaluation, timeout performed and anesthesia consent Hand hygiene performed  and maximum sterile barriers used  PA cath was placed.Swan type:thermodilution PA Cath depth:48 Procedure performed without using ultrasound guided technique. Attempts: 1 Post procedure assessment: free fluid flow and no air  Patient tolerated the procedure well with no immediate complications.

## 2023-06-25 NOTE — Anesthesia Procedure Notes (Signed)
Arterial Line Insertion Start/End7/12/2022 6:55 AM, 06/25/2023 7:00 AM Performed by: Rachel Moulds, CRNA, CRNA  Patient location: Pre-op. Preanesthetic checklist: patient identified, IV checked, site marked, risks and benefits discussed, surgical consent, monitors and equipment checked, pre-op evaluation, timeout performed and anesthesia consent Lidocaine 1% used for infiltration and patient sedated Left, radial was placed Catheter size: 20 G Hand hygiene performed  and maximum sterile barriers used  Allen's test indicative of satisfactory collateral circulation Attempts: 1 Procedure performed without using ultrasound guided technique. Following insertion, Biopatch and dressing applied. Post procedure assessment: normal  Patient tolerated the procedure well with no immediate complications.

## 2023-06-25 NOTE — Hospital Course (Addendum)
History of Present Illness: 52 yo man with longstanding history of CAD including multiple stents in past.  Presented with fatigue and unstable CP.  Cath showed patent LAD stent, CTO of previously stented RCA and a 90% ostial stenosis of the circumflex. He is not interested in high risk PCI.   CABG indicated for relief of symptoms and likely survival benefit despite the lack of significant LAD disease.   I discussed the general nature of the procedure, including the need for general anesthesia, the incisions to be used, the use of cardiopulmonary bypass, and the use of drainage tubes and temporary pacemaker wires with Mr. Sigler. We discussed the expected hospital stay, overall recovery and short and long term outcomes. He understands the procedure is palliative, not curative.  He understands the risks include, but are not limited to death, stroke, MI, DVT/PE, bleeding, possible need for transfusion, infections, cardiac arrhythmias, as well as other organ system dysfunction including respiratory, renal, or GI complications.    He accepts the risks and agrees to proceed.    For CABG x 1 with LIMA to OM1 on Monday 7/1   Will check RCA branches to see if there are any graftable targets, although there do not appear to be based on multiple previous catheterizations.  Hospital Course: Mr. Umsted remained stable following transfer to [facility]. After discussion with Dr. Dorris Fetch he agreed to proceed with surgery.  He was taken to the OR on 06/25/23 where single-vessel CABG was performed with the left internal mammary artery grafted to the first obtuse marginal artery. His drips were weaned as hemodynamics tolerated. Plavix would be started before discharge for hx of NSTEMI and stents. His swan ganz catheter, arterial line and chest tubes were removed on POD1 without complication. The patient noted a popping sound after rolling to his side and coughing. Dr. Dorris Fetch examined the patient and found  his sternum to be stable with some movement of the left costal cartilage. There was no popping or clicking with palpation, incision was clean, dry and intact. CXR was ordered and showed ***. He had an AKI, possibly due to restarting Farxiga or volume overload, as discussed with Dr. Cliffton Asters diuresis was held and creatinine was monitored. Creatinine decreased to 1.34 on 07/05. He does not need further diuresis. He will be continued on Farxiga at discharge, but will not restart Entresto or Spironolactone until sometime after discharge. His ec asa was decreased to 81 mg daily and he was restarted on Eliquis 07/05. He had thrombocytopenia post op and platelet count today was up to 131,000. He has been tolerating a diet. His sternal wound is clean, dry, and stable on exam. As discussed with Dr. Dorris Fetch, he is felt stable for discharge.

## 2023-06-25 NOTE — Anesthesia Procedure Notes (Signed)
Central Venous Catheter Insertion Performed by: Lewie Loron, MD, anesthesiologist Start/End7/12/2022 6:40 AM Patient location: Pre-op. Preanesthetic checklist: patient identified, IV checked, site marked, risks and benefits discussed, surgical consent, monitors and equipment checked, pre-op evaluation, timeout performed and anesthesia consent Lidocaine 1% used for infiltration and patient sedated Hand hygiene performed , maximum sterile barriers used  and Seldinger technique used Catheter size: 9 Fr Sheath introducer Procedure performed using ultrasound guided technique. Ultrasound Notes:anatomy identified, needle tip was noted to be adjacent to the nerve/plexus identified, no ultrasound evidence of intravascular and/or intraneural injection and image(s) printed for medical record Attempts: 1 Following insertion, line sutured, dressing applied and Biopatch. Post procedure assessment: blood return through all ports, free fluid flow and no air  Patient tolerated the procedure well with no immediate complications.

## 2023-06-25 NOTE — Procedures (Signed)
Extubation Procedure Note  Patient Details:   Name: Bobby [name] DOB: [birth year] MRN: [id number]   Airway Documentation:    Vent end date: 06/25/23 Vent end time: 1546   Evaluation  O2 sats: stable throughout Complications: No apparent complications Patient did tolerate procedure well. Bilateral Breath Sounds: Clear   Yes  Extubation per rapid wean protocol, positive cuff leak prior to extubation, NIF - 33 and VC 1.6 L pt on 4 L [place]. Vitals stable.   Doralee Albino 06/25/2023, 3:48 PM

## 2023-06-25 NOTE — Discharge Summary (Signed)
Physician Discharge Summary  Patient ID: Bobby [name] MRN: [id number] DOB/AGE: [birth year] 52 y.o.  Admit date: 06/22/2023 Discharge date: 06/29/2023  Admission Diagnoses:   Unstable angina pectoris Multivessel coronary artery disease History of myocardial infarction History of multiple percutaneous coronary interventions History of heart failure with reduced ejection fraction Ischemic cardiomyopathy Hypothyroidism New onset atrial fibrillation with rapid ventricular response   Discharge Diagnoses:   Unstable angina pectoris Multivessel coronary artery disease Status post CABG x 1 History of myocardial infarction History of multiple percutaneous coronary interventions History of heart failure with reduced ejection fraction Ischemic cardiomyopathy Hypothyroidism New onset atrial fibrillation with rapid ventricular response  Discharged Condition: stable  History of Present Illness: 52 yo man with longstanding history of CAD including multiple stents in past.  Presented with fatigue and unstable CP.  Cath showed patent LAD stent, CTO of previously stented RCA and a 90% ostial stenosis of the circumflex. He is not interested in high risk PCI.   CABG indicated for relief of symptoms and likely survival benefit despite the lack of significant LAD disease.   I discussed the general nature of the procedure, including the need for general anesthesia, the incisions to be used, the use of cardiopulmonary bypass, and the use of drainage tubes and temporary pacemaker wires with Bobby [name]. We discussed the expected hospital stay, overall recovery and short and long term outcomes. He understands the procedure is palliative, not curative.  He understands the risks include, but are not limited to death, stroke, MI, DVT/PE, bleeding, possible need for transfusion, infections, cardiac arrhythmias, as well as other organ system dysfunction including respiratory, renal, or GI complications.    He  accepts the risks and agrees to proceed.    For CABG x 1 with LIMA to OM1 on Monday 7/1   Will check RCA branches to see if there are any graftable targets, although there do not appear to be based on multiple previous catheterizations.  Hospital Course: Bobby [name] remained stable following transfer to [facility]. After discussion with Dr. Dorris Fetch he agreed to proceed with surgery.  He was taken to the OR on 06/25/23 where single-vessel CABG was performed with the left internal mammary artery grafted to the first obtuse marginal artery. His drips were weaned as hemodynamics tolerated. Plavix would be started before discharge for hx of NSTEMI and stents. His swan ganz catheter, arterial line and chest tubes were removed on POD1 without complication. The patient noted a popping sound after rolling to his side and coughing. Dr. Dorris Fetch examined the patient and found his sternum to be stable with some movement of the left costal cartilage. There was no popping or clicking with palpation, incision was clean, dry and intact. CXR was ordered and showed small bilateral pleural effusions with bibasilar atelectasis.  He had an AKI, possibly due to restarting Farxiga or volume overload. As discussed with Dr. Cliffton Asters, diuresis was held and creatinine was monitored. Creatinine decreased to 1.34 on 07/05. He does not need further diuresis as is at pre op weight and no significant LE edema. He will be continued on Farxiga at discharge, but will not restart Entresto or Spironolactone until later as an outpatient.  He had thrombocytopenia post op and platelet count today was up to 131,000. His ec asa was decreased to 81 mg daily and he was restarted on Eliquis 07/05. He has been tolerating a diet. His sternal wound is clean, dry, and stable on exam. As discussed with Dr. Dorris Fetch, he is felt stable  for discharge.  Consults: None  Significant Diagnostic Studies:  ABDOMINAL AORTOGRAM  RIGHT/LEFT HEART  CATH AND CORONARY ANGIOGRAPHY    Conclusions: Significant two-vessel coronary artery disease, including 90% ostial LCx stenosis (ostium appears jailed by proximal LAD stent extending back into the distal LMCA) and chronic total occlusion of proximal RCA stent. Widely patent ostial/proximal LAD stent. Severely reduced left ventricular systolic function (LVEF 25-35%). Upper normal to mildly elevated left and right heart filling pressures (LVEDP 16 mmHg, PCWP 16 mm Hg, mean RA 7). Low normal to mildly reduced cardiac output/index (Fick CO 5.1 L/min, CI 2.3 L/min/m^2). Angiographically normal abdominal aorta, iliac arteries, and right common femoral artery.   Left Main  Vessel is large.    Left Anterior Descending  Vessel is large.  Non-stenotic Ost LAD to Prox LAD lesion was previously treated.    First Diagonal Branch  Vessel is moderate in size.  Ost 1st Diag lesion is 25% stenosed.    Ramus Intermedius  Ost Ramus lesion is 50% stenosed.    Left Circumflex  Vessel is large.  Ost Cx lesion is 90% stenosed. Vessel is the culprit lesion.    First Obtuse Marginal Branch  Vessel is large in size.  1st Mrg lesion is 25% stenosed.    Second Obtuse Marginal Branch  Vessel is small in size.    Third Obtuse Marginal Branch  Vessel is moderate in size.    Right Coronary Artery  Collaterals  Mid RCA filled by collaterals from Dist LAD.     Collaterals  Mid RCA filled by collaterals from Dist Cx.     Prox RCA lesion is 100% stenosed. The lesion was previously treated . Known CTO. RCA not engaged today     ECHOCARDIOGRAM REPORT   Patient Name:   Bobby [name] Date of Exam: 06/22/2023  Medical Rec #:  [id number]   Height:       73.0 in  Accession #:    [id number]  Weight:       215.0 lb  Date of Birth:  1971/12/23  BSA:          2.219 m  Patient Age:    52 years    BP:           100/79 mmHg  Patient Gender: M           HR:           63 bpm.  Exam Location:  [facility]   Procedure: 2D  Echo, Cardiac Doppler, Color Doppler and Intracardiac             Opacification Agent   Indications:     Acute ischemic heart disease, unspecified I24.9    History:         Patient has prior history of Echocardiogram examinations,  most                  recent 07/14/2017. CHF; Previous Myocardial Infarction.    Sonographer:     Cristela Blue  Referring Phys:  [id number] CHRISTOPHER END  Diagnosing Phys: Yvonne Kendall MD     Sonographer Comments: Suboptimal parasternal window and suboptimal apical  window.  IMPRESSIONS   1. Left ventricular ejection fraction, by estimation, is 25 to 30%. The  left ventricle has severely decreased function. The left ventricle  demonstrates regional wall motion abnormalities (see scoring  diagram/findings for description). There is mild left   ventricular hypertrophy. Left ventricular diastolic parameters are  consistent with Grade I diastolic dysfunction (  impaired relaxation). There  is akinesis of the left ventricular, entire anterior wall and apical  segment.   2. Right ventricular systolic function is mildly reduced. The right  ventricular size is mildly enlarged. Tricuspid regurgitation signal is  inadequate for assessing PA pressure.   3. The mitral valve was not well visualized. No evidence of mitral valve  regurgitation. No evidence of mitral stenosis.   4. The aortic valve was not well visualized. Aortic valve regurgitation  is not visualized. No aortic stenosis is present.   FINDINGS   Left Ventricle: Left ventricular ejection fraction, by estimation, is 25  to 30%. The left ventricle has severely decreased function. The left  ventricle demonstrates regional wall motion abnormalities. Definity  contrast agent was given IV to delineate  the left ventricular endocardial borders. The left ventricular internal  cavity size was normal in size. There is mild left ventricular  hypertrophy. Left ventricular diastolic parameters are consistent with   Grade I diastolic dysfunction (impaired  relaxation).   Right Ventricle: The right ventricular size is mildly enlarged. No  increase in right ventricular wall thickness. Right ventricular systolic  function is mildly reduced. Tricuspid regurgitation signal is inadequate  for assessing PA pressure.   Left Atrium: Left atrial size was normal in size.   Right Atrium: Right atrial size was normal in size.   Pericardium: There is no evidence of pericardial effusion.   Mitral Valve: The mitral valve was not well visualized. No evidence of  mitral valve regurgitation. No evidence of mitral valve stenosis. MV peak  gradient, 2.4 mmHg. The mean mitral valve gradient is 1.0 mmHg.   Tricuspid Valve: The tricuspid valve is not well visualized. Tricuspid  valve regurgitation is trivial.   Aortic Valve: The aortic valve was not well visualized. Aortic valve  regurgitation is not visualized. No aortic stenosis is present. Aortic  valve mean gradient measures 2.0 mmHg. Aortic valve peak gradient measures  3.4 mmHg. Aortic valve area, by VTI  measures 2.96 cm.   Pulmonic Valve: The pulmonic valve was not well visualized. Pulmonic valve  regurgitation is not visualized. No evidence of pulmonic stenosis.   Aorta: The aortic root is normal in size and structure.   Pulmonary Artery: The pulmonary artery is not well seen.   Venous: The inferior vena cava was not well visualized.   IAS/Shunts: The interatrial septum was not well visualized.     LEFT VENTRICLE  PLAX 2D  LVIDd:         4.70 cm      Diastology  LVIDs:         4.00 cm      LV e' medial:    5.44 cm/s  LV PW:         1.10 cm      LV E/e' medial:  11.9  LV IVS:        1.10 cm      LV e' lateral:   7.51 cm/s  LVOT diam:     2.00 cm      LV E/e' lateral: 8.7  LV SV:         47  LV SV Index:   21  LVOT Area:     3.14 cm    LV Volumes (MOD)  LV vol d, MOD A2C: 86.1 ml  LV vol d, MOD A4C: 150.0 ml  LV vol s, MOD A2C: 74.1 ml   LV vol s, MOD A4C: 106.0 ml  LV SV MOD A2C:  12.0 ml  LV SV MOD A4C:     150.0 ml  LV SV MOD BP:      22.5 ml   RIGHT VENTRICLE  RV Basal diam:  4.90 cm  RV Mid diam:    3.60 cm  RV S prime:     10.80 cm/s  TAPSE (M-mode): 1.4 cm   LEFT ATRIUM           Index        RIGHT ATRIUM           Index  LA diam:      3.30 cm 1.49 cm/m   RA Area:     17.00 cm  LA Vol (A2C): 53.4 ml 24.07 ml/m  RA Volume:   49.00 ml  22.08 ml/m  LA Vol (A4C): 25.2 ml 11.36 ml/m   AORTIC VALVE  AV Area (Vmax):    2.63 cm  AV Area (Vmean):   2.72 cm  AV Area (VTI):     2.96 cm  AV Vmax:           91.60 cm/s  AV Vmean:          60.300 cm/s  AV VTI:            0.160 m  AV Peak Grad:      3.4 mmHg  AV Mean Grad:      2.0 mmHg  LVOT Vmax:         76.60 cm/s  LVOT Vmean:        52.300 cm/s  LVOT VTI:          0.151 m  LVOT/AV VTI ratio: 0.94    AORTA  Ao Root diam: 2.60 cm   MITRAL VALVE  MV Area (PHT): 3.31 cm    SHUNTS  MV Area VTI:   2.08 cm    Systemic VTI:  0.15 m  MV Peak grad:  2.4 mmHg    Systemic Diam: 2.00 cm  MV Mean grad:  1.0 mmHg  MV Vmax:       0.78 m/s  MV Vmean:      43.1 cm/s  MV Decel Time: 229 msec  MV E velocity: 65.00 cm/s  MV A velocity: 79.10 cm/s  MV E/A ratio:  0.82   Christopher End MD  Electronically signed by Yvonne Kendall MD  Signature Date/Time: 06/22/2023/1:05:21 PM      Final      Treatments: surgery:  Operative Report    DATE OF PROCEDURE: 06/25/2023   PREOPERATIVE DIAGNOSIS:  Severe 2-vessel coronary artery disease with impaired left ventricular function and unstable chest pain.   POSTOPERATIVE DIAGNOSIS:  Severe 2-vessel coronary artery disease with impaired left ventricular function and unstable chest pain.   PROCEDURE PERFORMED:  Median sternotomy, extracorporeal circulation, coronary artery bypass grafting x1 (left internal mammary artery to obtuse marginal 1.).   SURGEON:  Salvatore Decent. Dorris Fetch, MD  Discharge Exam: Blood pressure  104/84, pulse 72, temperature 98.5 F (36.9 C), temperature source Oral, resp. rate 16, height 6\' 1"  (1.854 m), weight 101.6 kg, SpO2 97 %.   Disposition:  Discharge disposition: 01-Home or Self Care      Cardiovascular: RRR Pulmonary: Clear to auscultation bilaterally Abdomen: Soft, non tender, bowel sounds present. Extremities: Trace bilateral lower extremity edema. Wounds: Clean and dry.  No erythema or signs of infection. Sternum is stable. There is a clicking/popping to left of sternum upper rib area with cough  Allergies as of 06/29/2023   No  Known Allergies      Medication List     STOP taking these medications    Corlanor 5 MG Tabs tablet Generic drug: ivabradine   Entresto 24-26 MG Generic drug: sacubitril-valsartan   nitroGLYCERIN 0.4 MG SL tablet Commonly known as: NITROSTAT   spironolactone 25 MG tablet Commonly known as: ALDACTONE       TAKE these medications    ALPRAZolam 0.5 MG tablet Commonly known as: XANAX Take 1 tablet (0.5 mg total) by mouth at bedtime as needed.   amiodarone 200 MG tablet Commonly known as: PACERONE Take 200 mg two times daily for 10 days; then take 200 mg daily thereafter   apixaban 5 MG Tabs tablet Commonly known as: ELIQUIS Take 1 tablet (5 mg total) by mouth 2 (two) times daily. Await additional instructions once at [facility]   aspirin 81 MG chewable tablet Chew 1 tablet (81 mg total) by mouth daily.   atorvastatin 80 MG tablet Commonly known as: LIPITOR Take 1 tablet (80 mg total) by mouth daily. What changed:  medication strength how much to take   carvedilol 3.125 MG tablet Commonly known as: COREG Take 1 tablet (3.125 mg total) by mouth 2 (two) times daily.   ezetimibe 10 MG tablet Commonly known as: ZETIA Take 1 tablet (10 mg total) by mouth daily.   Farxiga 10 MG Tabs tablet Generic drug: dapagliflozin propanediol Take 1 tablet (10 mg total) by mouth daily before breakfast.   levothyroxine 137  MCG tablet Commonly known as: SYNTHROID Take 1 tablet (137 mcg total) by mouth daily.   oxyCODONE 5 MG immediate release tablet Commonly known as: Oxy IR/ROXICODONE Take 1 tablet (5 mg total) by mouth every 6 (six) hours as needed for severe pain.   pantoprazole 40 MG tablet Commonly known as: PROTONIX Take 1 tablet (40 mg total) by mouth 2 (two) times daily.   Repatha SureClick 140 MG/ML Soaj Generic drug: Evolocumab Inject 140mg  under the skin every 14 days (inject 140mg  every 14 days) What changed:  how much to take how to take this when to take this   sildenafil 100 MG tablet Commonly known as: VIAGRA Take 1 tablet (100 mg total) by mouth as needed for erectile dysfunction. TAKE 1 (ONE) TABLET AS NEEDED. DO NOT USE AT SAME TIME AS NITRO        Follow-up Information     Loreli Slot, MD. Go on 07/31/2023.   Specialty: Cardiothoracic Surgery Why: Your appointment is at 11:45AM Please obtain a chest x-ray at [facility] [address] 1 hour before the appointment. Contact information: [address] [employer] [address] [place] [address] [phone]         [facility]. Go on 07/31/2023.   Why: Please get a chest x-ray at this locationat 10:45AM, 1 hour before your appointment with Dr. Dorris Fetch. Contact information: [address] [place] [address]        Fransico Michael, Cadence H, PA-C Follow up on 07/24/2023.   Specialty: Cardiology Why: Cardiology follow up is at 10:05AM Contact information: [address] [place] [address] [phone]         Dana Allan, MD. Call.   Specialty: Family Medicine Why: for follow up appointment regarding furhter surveillance of HGA1C 6.4 (borderline diabetes) Contact information: [address] [place] [address] [phone]                 The patient has been discharged on:  1.Beta Blocker:  Yes [  X]                               No   [   ]                              If No, reason:  2.Ace Inhibitor/ARB: Yes [   ]                                     No  [  x  ]                                     If No, reason:Elevated creatinine  3.Statin:   Yes [ X ]                  No  [   ]                  If No, reason:  4.Ecasa:  Yes  [  X ]                  No   [   ]                  If No, reason:  5. ACS on Admission?  No  P2Y12 Inhibitor:  Yes  [   ]                                No  Arly.Keller  ]    Signed: Ardelle Balls, PA-C 06/29/2023 7:51 am

## 2023-06-25 NOTE — Op Note (Signed)
NAMECHANCELER, KOLB MEDICAL RECORD NO: [id number] ACCOUNT NO: [id number] DATE OF BIRTH: 01-20-1971 FACILITY: [facility] LOCATION: [facility] PHYSICIAN: Salvatore Decent. Dorris Fetch, MD  Operative Report   DATE OF PROCEDURE: 06/25/2023  PREOPERATIVE DIAGNOSIS:  Severe 2-vessel coronary artery disease with impaired left ventricular function and unstable chest pain.  POSTOPERATIVE DIAGNOSIS:  Severe 2-vessel coronary artery disease with impaired left ventricular function and unstable chest pain.  PROCEDURE PERFORMED:   Median sternotomy, extracorporeal circulation,  Coronary artery bypass grafting x1  Left internal mammary artery to obtuse marginal 1.  SURGEON:  Salvatore Decent. Dorris Fetch, MD  ASSISTANT:  Jillyn Hidden, PA  Experienced assistance was necessary for this case due to surgical complexity.  Mr. Hedwig Morton was present during the performance of the distal anastomosis and provided assistance with retraction of delicate tissues, exposure, suture management and suctioning.  ANESTHESIA:  General.  FINDINGS:  Transesophageal echocardiography showed ejection fraction of approximately 25% with mild mitral regurgitation, some improvement of the lateral wall motion post-bypass. No change in mitral regurgitation.  Mammary artery and OM1, both good quality vessels.  OM1 intramyocardial. No graftable target vessel in RCA distribution.  CLINICAL NOTE:  Mr. Balderama is a 52 year old gentleman with a longstanding history of coronary artery disease.  He presented with unstable chest pain and was found to have significant 2-vessel disease with a 90% ostial left circumflex stenosis and chronic total occlusion of the right coronary.  The patient was offered coronary artery bypass grafting with the plan to graft the large obtuse marginal 1 and to assess to see if there were any graftable targets in the distal right coronary distribution.  The indications, risks, benefits, and alternatives were discussed in detail with the  patient.  He understood and accepted the risks and agreed to proceed.  OPERATIVE NOTE:  Mr. Umemoto was brought to the preoperative holding area on 06/25/2023.  The anesthesia service under direction of Dr. Renold Don placed a Swan-Ganz catheter and an arterial blood pressure monitoring line.  He was taken to the operating room, anesthetized and intubated.  A Foley catheter was placed.  Intravenous antibiotics were administered.  The chest, abdomen and legs were prepped and draped in the usual sterile fashion.  Transesophageal echocardiography was performed by Dr. Renold Don.  Please see his separately dictated note for full details of the procedure.  A timeout was performed.  Median sternotomy was performed.  Sternal retractor was placed and was gradually opened.  The pericardium was opened.  The heart was elevated exposing the inferior wall and there were no graftable target vessels in the right coronary distribution.  There was some scar noted in the myocardium in that area on the inferior wall.  The sternal retractor was removed.  The Rultract retractor was placed and the left internal mammary artery was harvested using standard technique.  The patient was fully heparinized prior to dividing the distal end of the mammary artery.  There was excellent flow through the cut end of the vessel.  Sternal retractor was again placed, and the pericardium was opened.  Pericardial stay sutures were placed.  The ascending aorta was inspected.  After confirming adequate anticoagulation with ACT measurement, the aorta was cannulated via concentric 2-0 Ethibond pledgeted pursestring sutures.  A dual stage venous cannula was placed via a pursestring suture in the right atrial appendage.  Cardiopulmonary bypass was initiated.  Flows were maintained per protocol.  The patient was cooled to 32 degrees Celsius.  The coronary arteries  were inspected and again there was nothing to  graft in the right coronary distribution.  The  OM1 was intramyocardial.  A foam pad was placed in the pericardium to insulate the heart.  A temperature probe was placed in the myocardial septum and a cardioplegia cannula was placed in the ascending aorta.  The aorta was crossclamped.  The left ventricle was emptied via the aortic root vent.  Cardiac arrest then was achieved with combination of cold antegrade blood cardioplegia and topical iced saline.  One liter of cardioplegia was administered.  There was a rapid diastolic arrest and rapid septal cooling as expected with a patent LAD.  The heart was elevated exposing the lateral wall.  OM1 was dissected out.  It was intramyocardial.  It was a 2 mm, good quality target vessel.  The mammary was brought through a window in the pericardium.  The distal end was bevelled.  It was 2 mm good quality conduit.  An end-to-side anastomosis was performed with a running 8-0 Prolene suture.  At completion anastomosis, the bulldog clamp was removed.  There was good hemostasis at the anastomosis.  There was some bleeding from the myocardium, which was controlled with sutures.  The heart then was allowed to rest in its normal location in the pericardium and the aortic crossclamp was removed.  The total crossclamp time was 22 minutes.  The patient's temperature was maintained at 37 degrees Celsius during the bypass run.  He was initially bradycardic after removing the cross-clamp.  Epicardial pacing wires were placed on the right ventricle and right atrium and DDD pacing was initiated at 80 beats per minute.  He then weaned from cardiopulmonary bypass on the first attempt.  Total bypass time was 40 minutes.  He did not require inotropic support.  Initial cardiac index was greater than 2 liters per minute per meter squared.  He remained hemodynamically stable throughout the post-bypass period except for  a transient drop in his blood pressure a few minutes after completion of the protamine that responded rapidly to volume  administration.  The cardioplegia cannula was removed from the ascending aorta.  A test dose of protamine was administered and was well tolerated.  The atrial and aortic cannulae were removed.  The remainder of the protamine was administered and as noted a few minutes after completion of the protamine, there was a transient drop in blood pressure.  The chest was copiously irrigated with warm saline.  Hemostasis was achieved.  The pericardium was reapproximated over the ascending aorta and base of the heart with interrupted 3-0 silk sutures.  Left pleural and mediastinal chest tubes were placed through separate subcostal incisions.  Sternum was closed with a combination of single and double heavy gauge stainless steel wires.  Pectoralis fascia, subcutaneous tissue and skin were closed in standard fashion.  All sponge, needle and instrument counts were correct at the end of the procedure.  The patient remained hemodynamically stable and was taken from the operating room to the surgical intensive care unit, intubated and in good condition.   PUS D: 06/25/2023 5:09:09 pm T: 06/25/2023 8:27:00 pm  JOB: [id number]/ [id number]

## 2023-06-25 NOTE — Interval H&P Note (Signed)
History and Physical Interval Note:  06/25/2023 6:57 AM  Bobby [name]  has presented today for surgery, with the diagnosis of CAD.  The various methods of treatment have been discussed with the patient and family. After consideration of risks, benefits and other options for treatment, the patient has consented to  Procedure(s): CORONARY ARTERY BYPASS GRAFTING (CABG) (N/A) TRANSESOPHAGEAL ECHOCARDIOGRAM (N/A) as a surgical intervention.  The patient's history has been reviewed, patient examined, no change in status, stable for surgery.  I have reviewed the patient's chart and labs.  Questions were answered to the patient's satisfaction.     Loreli Slot

## 2023-06-25 NOTE — Progress Notes (Signed)
Patient ID: Bobby [name], male   DOB: 04/08/1971, 52 y.o.   MRN: [id number]  TCTS Evening Rounds:   Hemodynamically stable  CI = 2.2  Extubated and alert.  Urine output good  CT output low  CBC    Component Value Date/Time   WBC 9.7 06/25/2023 1121   RBC 3.81 (L) 06/25/2023 1121   HGB 11.9 (L) 06/25/2023 1140   HGB 16.1 08/04/2020 0803   HCT 35.0 (L) 06/25/2023 1140   HCT 49.4 08/04/2020 0803   PLT 113 (L) 06/25/2023 1121   PLT 193 08/04/2020 0803   MCV 95.3 06/25/2023 1121   MCV 94 08/04/2020 0803   MCH 32.5 06/25/2023 1121   MCHC 34.2 06/25/2023 1121   RDW 13.5 06/25/2023 1121   RDW 12.7 08/04/2020 0803   LYMPHSABS 2.9 06/15/2023 0641   LYMPHSABS 2.1 03/16/2020 0732   MONOABS 0.7 06/15/2023 0641   EOSABS 0.2 06/15/2023 0641   EOSABS 0.2 03/16/2020 0732   BASOSABS 0.1 06/15/2023 0641   BASOSABS 0.1 03/16/2020 0732     BMET    Component Value Date/Time   NA 139 06/25/2023 1140   NA 141 04/25/2021 1536   K 4.4 06/25/2023 1140   CL 105 06/25/2023 1041   CO2 24 06/24/2023 0028   GLUCOSE 140 (H) 06/25/2023 1041   BUN 13 06/25/2023 1041   BUN 13 04/25/2021 1536   CREATININE 1.10 06/25/2023 1041   CALCIUM 9.5 06/24/2023 0028   EGFR 57 (L) 04/25/2021 1536   GFRNONAA >60 06/24/2023 0028     A/P:  Stable postop course. Continue current plans

## 2023-06-25 NOTE — Discharge Instructions (Addendum)

## 2023-06-25 NOTE — Anesthesia Procedure Notes (Signed)
Procedure Name: Intubation Date/Time: 06/25/2023 7:39 AM  Performed by: Rachel Moulds, CRNAPre-anesthesia Checklist: Patient identified, Emergency Drugs available, Suction available, Patient being monitored and Timeout performed Patient Re-evaluated:Patient Re-evaluated prior to induction Oxygen Delivery Method: Circle system utilized Preoxygenation: Pre-oxygenation with 100% oxygen Induction Type: IV induction Ventilation: Mask ventilation without difficulty Laryngoscope Size: Mac and 4 Grade View: Grade III Tube type: Oral Tube size: 8.0 mm Number of attempts: 1 Airway Equipment and Method: Stylet Placement Confirmation: ETT inserted through vocal cords under direct vision, positive ETCO2, CO2 detector and breath sounds checked- equal and bilateral Secured at: 24 cm Tube secured with: Tape Dental Injury: Teeth and Oropharynx as per pre-operative assessment

## 2023-06-25 NOTE — Transfer of Care (Signed)
Immediate Anesthesia Transfer of Care Note  Patient: Bobby [name]  Procedure(s) Performed: CORONARY ARTERY BYPASS GRAFTING (CABG) x1 USING LEFT INTERNAL MAMMARY ARTERY (LIMA) (Chest) TRANSESOPHAGEAL ECHOCARDIOGRAM  Patient Location: SICU  Anesthesia Type:General  Level of Consciousness: Patient remains intubated per anesthesia plan  Airway & Oxygen Therapy: Patient remains intubated per anesthesia plan and Patient placed on Ventilator (see vital sign flow sheet for setting)  Post-op Assessment: Report given to RN and Post -op Vital signs reviewed and stable  Post vital signs: Reviewed and stable  Last Vitals:  Vitals Value Taken Time  BP 108/61   Temp    Pulse 80   Resp 16   SpO2 97     Last Pain:  Vitals:   06/25/23 0719  TempSrc:   PainSc: 0-No pain      Patients Stated Pain Goal: 0 (06/25/23 0719)  Complications: No notable events documented.

## 2023-06-25 NOTE — Brief Op Note (Signed)
06/22/2023 - 06/25/2023  10:19 AM  PATIENT:  Bobby [name]  52 y.o. male  PRE-OPERATIVE DIAGNOSIS:  Coronary Artery Disease, Unstable Angina, Ischemic Cardiomyopathy  POST-OPERATIVE DIAGNOSIS:  Coronary Artery Disease, Unstable Angina, Ischemic Cardiomyopathy  PROCEDURE:   CORONARY ARTERY BYPASS GRAFTING x1 USING LEFT INTERNAL MAMMARY ARTERY   LIMA-OM1  TRANSESOPHAGEAL ECHOCARDIOGRAM   SURGEON:  Loreli Slot, MD - Primary  PHYSICIAN ASSISTANT: Mavrik Bynum  ASSISTANTS: Ferne Coe, RN, RN First Assistant    ANESTHESIA:   general  EBL:   BLOOD ADMINISTERED:none  DRAINS:  Left pleural and mediastinal drains    LOCAL MEDICATIONS USED:  NONE  COUNTS:  Correct  DICTATION: .Dragon Dictation  PLAN OF CARE: Admit to inpatient   PATIENT DISPOSITION:  ICU - intubated and hemodynamically stable.   Delay start of Pharmacological VTE agent (>24hrs) due to surgical blood loss or risk of bleeding: yes

## 2023-06-26 ENCOUNTER — Encounter (HOSPITAL_COMMUNITY): Payer: Self-pay | Admitting: Thoracic Surgery (Cardiothoracic Vascular Surgery)

## 2023-06-26 ENCOUNTER — Inpatient Hospital Stay (HOSPITAL_COMMUNITY): Payer: BC Managed Care – PPO

## 2023-06-26 LAB — BASIC METABOLIC PANEL
Anion gap: 6 (ref 5–15)
Anion gap: 7 (ref 5–15)
BUN: 10 mg/dL (ref 6–20)
BUN: 9 mg/dL (ref 6–20)
CO2: 23 mmol/L (ref 22–32)
CO2: 26 mmol/L (ref 22–32)
Calcium: 8.2 mg/dL — ABNORMAL LOW (ref 8.9–10.3)
Calcium: 8.3 mg/dL — ABNORMAL LOW (ref 8.9–10.3)
Chloride: 106 mmol/L (ref 98–111)
Chloride: 101 mmol/L (ref 98–111)
Creatinine, Ser: 1.19 mg/dL (ref 0.61–1.24)
Creatinine, Ser: 1.26 mg/dL — ABNORMAL HIGH (ref 0.61–1.24)
GFR, Estimated: >60 mL/min (ref >60)
GFR, Estimated: >60 mL/min (ref >60)
Glucose, Bld: 114 mg/dL — ABNORMAL HIGH (ref 70–99)
Glucose, Bld: 154 mg/dL — ABNORMAL HIGH (ref 70–99)
Potassium: 4.1 mmol/L (ref 3.5–5.1)
Potassium: 3.6 mmol/L (ref 3.5–5.1)
Sodium: 135 mmol/L (ref 135–145)
Sodium: 134 mmol/L — ABNORMAL LOW (ref 135–145)

## 2023-06-26 LAB — CBC
HCT: 33.8 % — ABNORMAL LOW (ref 39.0–52.0)
HCT: 34.6 % — ABNORMAL LOW (ref 39.0–52.0)
Hemoglobin: 11.7 g/dL — ABNORMAL LOW (ref 13.0–17.0)
Hemoglobin: 11.6 g/dL — ABNORMAL LOW (ref 13.0–17.0)
MCH: 32 pg (ref 26.0–34.0)
MCH: 31.7 pg (ref 26.0–34.0)
MCHC: 34.6 g/dL (ref 30.0–36.0)
MCHC: 33.5 g/dL (ref 30.0–36.0)
MCV: 92.3 fL (ref 80.0–100.0)
MCV: 94.5 fL (ref 80.0–100.0)
Platelets: 132 10*3/uL — ABNORMAL LOW (ref 150–400)
Platelets: 115 10*3/uL — ABNORMAL LOW (ref 150–400)
RBC: 3.66 MIL/uL — ABNORMAL LOW (ref 4.22–5.81)
RBC: 3.66 MIL/uL — ABNORMAL LOW (ref 4.22–5.81)
RDW: 13.9 % (ref 11.5–15.5)
RDW: 14.2 % (ref 11.5–15.5)
WBC: 12.8 10*3/uL — ABNORMAL HIGH (ref 4.0–10.5)
WBC: 10.1 10*3/uL (ref 4.0–10.5)
nRBC: 0 % (ref 0.0–0.2)
nRBC: 0 % (ref 0.0–0.2)

## 2023-06-26 LAB — MAGNESIUM
Magnesium: 2.2 mg/dL (ref 1.7–2.4)
Magnesium: 2.1 mg/dL (ref 1.7–2.4)

## 2023-06-26 LAB — GLUCOSE, CAPILLARY
Glucose-Capillary: 115 mg/dL — ABNORMAL HIGH (ref 70–99)
Glucose-Capillary: 119 mg/dL — ABNORMAL HIGH (ref 70–99)
Glucose-Capillary: 119 mg/dL — ABNORMAL HIGH (ref 70–99)
Glucose-Capillary: 127 mg/dL — ABNORMAL HIGH (ref 70–99)
Glucose-Capillary: 130 mg/dL — ABNORMAL HIGH (ref 70–99)
Glucose-Capillary: 143 mg/dL — ABNORMAL HIGH (ref 70–99)
Glucose-Capillary: 149 mg/dL — ABNORMAL HIGH (ref 70–99)
Glucose-Capillary: 150 mg/dL — ABNORMAL HIGH (ref 70–99)

## 2023-06-26 MED ORDER — INSULIN ASPART 100 UNIT/ML IJ SOLN
0.0000 [IU] | INTRAMUSCULAR | Status: DC
  Administered 2023-06-26 – 2023-06-27 (×6): 2 [IU] via SUBCUTANEOUS

## 2023-06-26 MED ORDER — POTASSIUM CHLORIDE CRYS ER 20 MEQ PO TBCR
20.0000 meq | EXTENDED_RELEASE_TABLET | ORAL | Status: AC
  Administered 2023-06-26 – 2023-06-27 (×3): 20 meq via ORAL
  Filled 2023-06-26 (×3): qty 1

## 2023-06-26 MED ORDER — FUROSEMIDE 10 MG/ML IJ SOLN
20.0000 mg | Freq: Once | INTRAMUSCULAR | Status: AC
  Administered 2023-06-26: 20 mg via INTRAVENOUS
  Filled 2023-06-26: qty 2

## 2023-06-26 MED ORDER — ENOXAPARIN SODIUM 40 MG/0.4ML IJ SOSY
40.0000 mg | PREFILLED_SYRINGE | Freq: Every day | INTRAMUSCULAR | Status: DC
  Administered 2023-06-26: 40 mg via SUBCUTANEOUS
  Filled 2023-06-26: qty 0.4

## 2023-06-26 MED ORDER — KETOROLAC TROMETHAMINE 15 MG/ML IJ SOLN
15.0000 mg | Freq: Four times a day (QID) | INTRAMUSCULAR | Status: AC
  Administered 2023-06-26 – 2023-06-28 (×7): 15 mg via INTRAVENOUS
  Filled 2023-06-26 (×7): qty 1

## 2023-06-26 MED ORDER — CALCIUM CHLORIDE 10 % IV SOLN
INTRAVENOUS | Status: AC
  Filled 2023-06-26: qty 10

## 2023-06-26 NOTE — Progress Notes (Signed)
EVENING ROUNDS NOTE :     [address]       [place] [address]             [phone]                 1 Day Post-Op Procedure(s) (LRB): CORONARY ARTERY BYPASS GRAFTING (CABG) x1 USING LEFT INTERNAL MAMMARY ARTERY (LIMA) (N/A) TRANSESOPHAGEAL ECHOCARDIOGRAM (N/A)   Total Length of Stay:  LOS: 4 days  Events:   No events Doing well    BP 105/60   Pulse 64   Temp 98.4 F (36.9 C) (Oral)   Resp 19   Ht 6\' 1"  (1.854 m)   Wt 103.1 kg   SpO2 92%   BMI 29.99 kg/m   PAP: (19-33)/(11-25) 27/16 CO:  [5.2 L/min-6.4 L/min] 5.2 L/min CI:  [2.4 L/min/m2-2.9 L/min/m2] 2.4 L/min/m2      sodium chloride Stopped (06/26/23 0928)   sodium chloride Stopped (06/26/23 1000)   sodium chloride 20 mL/hr at 06/26/23 1418   albumin human Stopped (06/25/23 1440)   amiodarone 30 mg/hr (06/26/23 1418)    ceFAZolin (ANCEF) IV 2 g (06/26/23 1418)   dexmedetomidine (PRECEDEX) IV infusion Stopped (06/25/23 1434)   insulin Stopped (06/26/23 0022)   lactated ringers Stopped (06/25/23 1805)   lactated ringers Stopped (06/26/23 1047)   phenylephrine (NEO-SYNEPHRINE) Adult infusion Stopped (06/26/23 0416)    I/O last 3 completed shifts: In: 7468.4 [P.O.:120; I.V.:5032.5; Blood:225; IV Piggyback:2090.8] Out: 5416 [Urine:3950; Emesis/NG output:550; Blood:450; Chest Tube:466]      Latest Ref Rng & Units 06/26/2023    2:44 AM 06/25/2023    5:35 PM 06/25/2023    4:44 PM  CBC  WBC 4.0 - 10.5 K/uL 12.8  12.0    Hemoglobin 13.0 - 17.0 g/dL 91.4  78.2  95.6   Hematocrit 39.0 - 52.0 % 33.8  34.1  34.0   Platelets 150 - 400 K/uL 132  122         Latest Ref Rng & Units 06/26/2023    2:44 AM 06/25/2023    5:35 PM 06/25/2023    4:44 PM  BMP  Glucose 70 - 99 mg/dL 213  086    BUN 6 - 20 mg/dL 10  13    Creatinine 5.78 - 1.24 mg/dL 4.69  6.29    Sodium 528 - 145 mmol/L 135  137  138   Potassium 3.5 - 5.1 mmol/L 4.1  4.0  4.2   Chloride 98 - 111 mmol/L 106  106    CO2 22 - 32 mmol/L 23   23    Calcium 8.9 - 10.3 mg/dL 8.2  8.2      ABG    Component Value Date/Time   PHART 7.287 (L) 06/25/2023 1644   PCO2ART 44.6 06/25/2023 1644   PO2ART 137 (H) 06/25/2023 1644   HCO3 21.4 06/25/2023 1644   TCO2 23 06/25/2023 1644   ACIDBASEDEF 5.0 (H) 06/25/2023 1644   O2SAT 99 06/25/2023 1644       Bobby Greathouse, MD 06/26/2023 4:17 PM

## 2023-06-26 NOTE — Progress Notes (Signed)
1 Day Post-Op Procedure(s) (LRB): CORONARY ARTERY BYPASS GRAFTING (CABG) x1 USING LEFT INTERNAL MAMMARY ARTERY (LIMA) (N/A) TRANSESOPHAGEAL ECHOCARDIOGRAM (N/A) Subjective: C/o incisional pain  Objective: Vital signs in last 24 hours: Temp:  [95.9 F (35.5 C)-98.8 F (37.1 C)] 98.6 F (37 C) (07/02 0734) Pulse Rate:  [68-80] 68 (07/02 0734) Cardiac Rhythm: Atrial paced (07/02 0400) Resp:  [8-23] 12 (07/02 0734) BP: (91-122)/(57-89) 99/69 (07/02 0700) SpO2:  [96 %-100 %] 97 % (07/02 0734) Arterial Line BP: (80-126)/(47-78) 107/53 (07/02 0734) FiO2 (%):  [40 %-50 %] 40 % (07/01 1512) Weight:  [103.1 kg] 103.1 kg (07/02 0500)  Hemodynamic parameters for last 24 hours: PAP: (19-37)/(11-27) 29/19 CO:  [4.3 L/min-6.8 L/min] 5.2 L/min CI:  [1.9 L/min/m2-3.1 L/min/m2] 2.4 L/min/m2  Intake/Output from previous day: 07/01 0701 - 07/02 0700 In: 6677.3 [P.O.:120; I.V.:4241.4; Blood:225; IV Piggyback:2090.8] Out: 4366 [Urine:2900; Emesis/NG output:550; Blood:450; Chest Tube:466] Intake/Output this shift: No intake/output data recorded.  General appearance: alert, cooperative, and no distress Neurologic: intact Heart: regular rate and rhythm Lungs: diminished breath sounds bibasilar  Lab Results: Recent Labs    06/25/23 1735 06/26/23 0244  WBC 12.0* 12.8*  HGB 11.8* 11.7*  HCT 34.1* 33.8*  PLT 122* 132*   BMET:  Recent Labs    06/25/23 1735 06/26/23 0244  NA 137 135  K 4.0 4.1  CL 106 106  CO2 23 23  GLUCOSE 155* 114*  BUN 13 10  CREATININE 1.13 1.19  CALCIUM 8.2* 8.2*    PT/INR:  Recent Labs    06/25/23 1121  LABPROT 15.9*  INR 1.2   ABG    Component Value Date/Time   PHART 7.287 (L) 06/25/2023 1644   HCO3 21.4 06/25/2023 1644   TCO2 23 06/25/2023 1644   ACIDBASEDEF 5.0 (H) 06/25/2023 1644   O2SAT 99 06/25/2023 1644   CBG (last 3)  Recent Labs    06/26/23 0248 06/26/23 0415 06/26/23 0726  GLUCAP 119* 115* 150*    Assessment/Plan: S/P  Procedure(s) (LRB): CORONARY ARTERY BYPASS GRAFTING (CABG) x1 USING LEFT INTERNAL MAMMARY ARTERY (LIMA) (N/A) TRANSESOPHAGEAL ECHOCARDIOGRAM (N/A) POD # 1 Looks great NEURO- intact CV- in SR with good hemodynamics  DC Swan and A line  ASA, statin, beta blocker  Add Plavix prior to DC RESP- IS for atelectasis RENAL- creatinine and lytes OK  Diurese ENDO- CBG mildly elevated  On q4 SSI GI- advance diet as tolerated Anemia secondary to ABL- mild, follow SCD + enoxaparin for DVT prophylaxis Ambulate Dc chest tubes Add toradol for pain   LOS: 4 days    Loreli Slot 06/26/2023

## 2023-06-26 NOTE — Anesthesia Postprocedure Evaluation (Signed)
Anesthesia Post Note  Patient: Bobby [name]  Procedure(s) Performed: CORONARY ARTERY BYPASS GRAFTING (CABG) x1 USING LEFT INTERNAL MAMMARY ARTERY (LIMA) (Chest) TRANSESOPHAGEAL ECHOCARDIOGRAM     Patient location during evaluation: ICU Anesthesia Type: General Level of consciousness: sedated and patient remains intubated per anesthesia plan Pain management: pain level controlled Vital Signs Assessment: post-procedure vital signs reviewed and stable Respiratory status: patient remains intubated per anesthesia plan Cardiovascular status: stable Anesthetic complications: no   No notable events documented.  Last Vitals:  Vitals:   06/26/23 0700 06/26/23 0734  BP: 99/69   Pulse: 68 68  Resp: 15 12  Temp: 37.1 C 37 C  SpO2: 98% 97%    Last Pain:  Vitals:   06/26/23 0734  TempSrc:   PainSc: 6                  Lewie Loron

## 2023-06-27 ENCOUNTER — Inpatient Hospital Stay (HOSPITAL_COMMUNITY): Payer: BC Managed Care – PPO

## 2023-06-27 LAB — BPAM RBC
Blood Product Expiration Date: 202407172359
ISSUE DATE / TIME: 202407010819
Unit Type and Rh: 600
Unit Type and Rh: 600
Unit Type and Rh: 600

## 2023-06-27 LAB — TYPE AND SCREEN: Antibody Screen: NEGATIVE

## 2023-06-27 LAB — BASIC METABOLIC PANEL
Anion gap: 10 (ref 5–15)
BUN: 11 mg/dL (ref 6–20)
CO2: 26 mmol/L (ref 22–32)
Calcium: 8.4 mg/dL — ABNORMAL LOW (ref 8.9–10.3)
Chloride: 101 mmol/L (ref 98–111)
Creatinine, Ser: 1.2 mg/dL (ref 0.61–1.24)
GFR, Estimated: >60 mL/min (ref >60)
Glucose, Bld: 115 mg/dL — ABNORMAL HIGH (ref 70–99)
Potassium: 4 mmol/L (ref 3.5–5.1)
Sodium: 137 mmol/L (ref 135–145)

## 2023-06-27 LAB — CBC
HCT: 31.9 % — ABNORMAL LOW (ref 39.0–52.0)
Hemoglobin: 10.6 g/dL — ABNORMAL LOW (ref 13.0–17.0)
MCH: 32.2 pg (ref 26.0–34.0)
MCHC: 33.2 g/dL (ref 30.0–36.0)
MCV: 97 fL (ref 80.0–100.0)
Platelets: 96 10*3/uL — ABNORMAL LOW (ref 150–400)
RBC: 3.29 MIL/uL — ABNORMAL LOW (ref 4.22–5.81)
RDW: 14.2 % (ref 11.5–15.5)
WBC: 10.1 10*3/uL (ref 4.0–10.5)
nRBC: 0 % (ref 0.0–0.2)

## 2023-06-27 LAB — GLUCOSE, CAPILLARY
Glucose-Capillary: 105 mg/dL — ABNORMAL HIGH (ref 70–99)
Glucose-Capillary: 122 mg/dL — ABNORMAL HIGH (ref 70–99)
Glucose-Capillary: 126 mg/dL — ABNORMAL HIGH (ref 70–99)
Glucose-Capillary: 130 mg/dL — ABNORMAL HIGH (ref 70–99)
Glucose-Capillary: 177 mg/dL — ABNORMAL HIGH (ref 70–99)
Glucose-Capillary: 84 mg/dL (ref 70–99)

## 2023-06-27 MED ORDER — DAPAGLIFLOZIN PROPANEDIOL 10 MG PO TABS
10.0000 mg | ORAL_TABLET | Freq: Every day | ORAL | Status: DC
  Administered 2023-06-27 – 2023-06-29 (×3): 10 mg via ORAL
  Filled 2023-06-27 (×3): qty 1

## 2023-06-27 MED ORDER — SODIUM CHLORIDE 0.9% FLUSH
3.0000 mL | Freq: Two times a day (BID) | INTRAVENOUS | Status: DC
  Administered 2023-06-27 – 2023-06-28 (×4): 3 mL via INTRAVENOUS

## 2023-06-27 MED ORDER — MAGNESIUM HYDROXIDE 400 MG/5ML PO SUSP: 30.0000 mL | Freq: Every day | ORAL | Status: DC | PRN

## 2023-06-27 MED ORDER — INSULIN ASPART 100 UNIT/ML IJ SOLN: 0.0000 [IU] | Freq: Three times a day (TID) | INTRAMUSCULAR | Status: DC

## 2023-06-27 MED ORDER — SODIUM CHLORIDE 0.9% FLUSH: 3.0000 mL | INTRAVENOUS | Status: DC | PRN

## 2023-06-27 MED ORDER — ALUM & MAG HYDROXIDE-SIMETH 200-200-20 MG/5ML PO SUSP: 15.0000 mL | Freq: Four times a day (QID) | ORAL | Status: DC | PRN

## 2023-06-27 MED ORDER — CARVEDILOL 3.125 MG PO TABS
3.1250 mg | ORAL_TABLET | Freq: Two times a day (BID) | ORAL | Status: DC
  Administered 2023-06-27 – 2023-06-29 (×5): 3.125 mg via ORAL
  Filled 2023-06-27 (×5): qty 1

## 2023-06-27 MED ORDER — INSULIN ASPART 100 UNIT/ML IJ SOLN
0.0000 [IU] | Freq: Three times a day (TID) | INTRAMUSCULAR | Status: DC
  Administered 2023-06-27: 3 [IU] via SUBCUTANEOUS
  Administered 2023-06-27: 2 [IU] via SUBCUTANEOUS
  Administered 2023-06-28: 3 [IU] via SUBCUTANEOUS

## 2023-06-27 MED ORDER — AMIODARONE HCL 200 MG PO TABS
400.0000 mg | ORAL_TABLET | Freq: Two times a day (BID) | ORAL | Status: DC
  Administered 2023-06-27 – 2023-06-29 (×5): 400 mg via ORAL
  Filled 2023-06-27 (×5): qty 2

## 2023-06-27 MED ORDER — ~~LOC~~ CARDIAC SURGERY, PATIENT & FAMILY EDUCATION
Freq: Once | Status: AC
  Administered 2023-06-27: 1

## 2023-06-27 MED ORDER — EZETIMIBE 10 MG PO TABS
10.0000 mg | ORAL_TABLET | Freq: Every day | ORAL | Status: DC
  Administered 2023-06-27 – 2023-06-29 (×3): 10 mg via ORAL
  Filled 2023-06-27 (×3): qty 1

## 2023-06-27 MED ORDER — SODIUM CHLORIDE 0.9 % IV SOLN: 250.0000 mL | INTRAVENOUS | Status: DC | PRN

## 2023-06-27 MED ORDER — ALPRAZOLAM 0.5 MG PO TABS
0.5000 mg | ORAL_TABLET | Freq: Every evening | ORAL | Status: DC | PRN
  Administered 2023-06-28: 0.5 mg via ORAL
  Filled 2023-06-27: qty 1

## 2023-06-27 NOTE — Progress Notes (Signed)
Patient arrived at the unit,CHG bath given,vitals taken,CCMD notified,pt oriented to the unit

## 2023-06-27 NOTE — Progress Notes (Signed)
      [address]       [place] [address]             [phone]      CTSP for popping sensation with cough.  This afternoon was rolled on his side by staff and felt discomfort, then coughed and felt a popping sensation.  Initial severe pain has subsided some.  On exam his sternum is stable and incision intact. There is some movement of a costal cartilage on left.  Salvatore Decent Dorris Fetch, MD [facility] [phone]

## 2023-06-27 NOTE — Progress Notes (Signed)
2 Days Post-Op Procedure(s) (LRB): CORONARY ARTERY BYPASS GRAFTING (CABG) x1 USING LEFT INTERNAL MAMMARY ARTERY (LIMA) (N/A) TRANSESOPHAGEAL ECHOCARDIOGRAM (N/A) Subjective: Some sternal soreness but not bad, no nausea  Objective: Vital signs in last 24 hours: Temp:  [98.2 F (36.8 C)-98.8 F (37.1 C)] 98.5 F (36.9 C) (07/03 0400) Pulse Rate:  [61-87] 79 (07/03 0700) Cardiac Rhythm: Normal sinus rhythm (07/03 0400) Resp:  [11-25] 21 (07/03 0700) BP: (80-120)/(56-79) 91/60 (07/03 0600) SpO2:  [75 %-99 %] 96 % (07/03 0700) Arterial Line BP: (105-111)/(52-55) 110/54 (07/02 0900) Weight:  [102.3 kg] 102.3 kg (07/03 0500)  Hemodynamic parameters for last 24 hours: PAP: (26-31)/(15-19) 27/16  Intake/Output from previous day: 07/02 0701 - 07/03 0700 In: 1416.9 [P.O.:360; I.V.:856.9; IV Piggyback:200] Out: 4035 [Urine:3975; Chest Tube:60] Intake/Output this shift: No intake/output data recorded.  General appearance: alert, cooperative, and no distress Neurologic: intact Heart: regular rate and rhythm Lungs: clear to auscultation bilaterally  Lab Results: Recent Labs    06/26/23 1714 06/27/23 0427  WBC 10.1 10.1  HGB 11.6* 10.6*  HCT 34.6* 31.9*  PLT 115* 96*   BMET:  Recent Labs    06/26/23 1714 06/27/23 0427  NA 134* 137  K 3.6 4.0  CL 101 101  CO2 26 26  GLUCOSE 154* 115*  BUN 9 11  CREATININE 1.26* 1.20  CALCIUM 8.3* 8.4*    PT/INR:  Recent Labs    06/25/23 1121  LABPROT 15.9*  INR 1.2   ABG    Component Value Date/Time   PHART 7.287 (L) 06/25/2023 1644   HCO3 21.4 06/25/2023 1644   TCO2 23 06/25/2023 1644   ACIDBASEDEF 5.0 (H) 06/25/2023 1644   O2SAT 99 06/25/2023 1644   CBG (last 3)  Recent Labs    06/27/23 0012 06/27/23 0427 06/27/23 0736  GLUCAP 105* 126* 177*    Assessment/Plan: S/P Procedure(s) (LRB): CORONARY ARTERY BYPASS GRAFTING (CABG) x1 USING LEFT INTERNAL MAMMARY ARTERY (LIMA) (N/A) TRANSESOPHAGEAL ECHOCARDIOGRAM  (N/A) Plan for transfer to step-down: see transfer orders POD # 2 Doing well NEURO- intact CV- in Sr, BP relatively low  Change Lopressor to Coreg  Hold off on Entresto until BP up  ASA  Resume Eliquis in 24-48 hours  Convert amiodarone to PO RESP - continue IS RENAL- creatinine and lytes oK  Fluid overloaded but hold off on diuresis due to BP ENDO- CBG well controlled  Resume Farxiga  Change SSI to [facility] and HS GI- tolerating diet Anemia secondary to ABL- mild Thrombocytopenia- PLT down to 96K- dc [place] Dc central line   LOS: 5 days    Loreli Slot 06/27/2023

## 2023-06-27 NOTE — Progress Notes (Signed)
Patient called and inform the RN that he hear pop sound when he cough also he felt popping his chest when he roll in the side upon the arrival in the unit,Dr Freistatt made aware, no new order advice

## 2023-06-28 LAB — BASIC METABOLIC PANEL
Anion gap: 6 (ref 5–15)
BUN: 12 mg/dL (ref 6–20)
CO2: 25 mmol/L (ref 22–32)
Calcium: 8.3 mg/dL — ABNORMAL LOW (ref 8.9–10.3)
Chloride: 107 mmol/L (ref 98–111)
Creatinine, Ser: 1.5 mg/dL — ABNORMAL HIGH (ref 0.61–1.24)
GFR, Estimated: 55 mL/min — ABNORMAL LOW (ref >60)
Glucose, Bld: 110 mg/dL — ABNORMAL HIGH (ref 70–99)
Potassium: 4.6 mmol/L (ref 3.5–5.1)
Sodium: 138 mmol/L (ref 135–145)

## 2023-06-28 LAB — GLUCOSE, CAPILLARY
Glucose-Capillary: 111 mg/dL — ABNORMAL HIGH (ref 70–99)
Glucose-Capillary: 89 mg/dL (ref 70–99)
Glucose-Capillary: 153 mg/dL — ABNORMAL HIGH (ref 70–99)
Glucose-Capillary: 119 mg/dL — ABNORMAL HIGH (ref 70–99)

## 2023-06-28 LAB — CBC
HCT: 32.3 % — ABNORMAL LOW (ref 39.0–52.0)
Hemoglobin: 10.7 g/dL — ABNORMAL LOW (ref 13.0–17.0)
MCH: 31.5 pg (ref 26.0–34.0)
MCHC: 33.1 g/dL (ref 30.0–36.0)
MCV: 95 fL (ref 80.0–100.0)
Platelets: 111 10*3/uL — ABNORMAL LOW (ref 150–400)
RBC: 3.4 MIL/uL — ABNORMAL LOW (ref 4.22–5.81)
RDW: 14.1 % (ref 11.5–15.5)
WBC: 10.1 10*3/uL (ref 4.0–10.5)
nRBC: 0 % (ref 0.0–0.2)

## 2023-06-28 MED ORDER — BISACODYL 5 MG PO TBEC: 10.0000 mg | DELAYED_RELEASE_TABLET | Freq: Every day | ORAL | Status: DC | PRN

## 2023-06-28 MED ORDER — APIXABAN 5 MG PO TABS
5.0000 mg | ORAL_TABLET | Freq: Two times a day (BID) | ORAL | Status: DC
  Administered 2023-06-29: 5 mg via ORAL
  Filled 2023-06-28: qty 1

## 2023-06-28 MED ORDER — DOCUSATE SODIUM 100 MG PO CAPS: 200.0000 mg | ORAL_CAPSULE | Freq: Every day | ORAL | Status: DC | PRN

## 2023-06-28 MED ORDER — BISACODYL 10 MG RE SUPP: 10.0000 mg | Freq: Every day | RECTAL | Status: DC | PRN

## 2023-06-28 MED ORDER — GUAIFENESIN ER 600 MG PO TB12
600.0000 mg | ORAL_TABLET | Freq: Two times a day (BID) | ORAL | Status: DC
  Administered 2023-06-28 – 2023-06-29 (×3): 600 mg via ORAL
  Filled 2023-06-28 (×3): qty 1

## 2023-06-28 NOTE — Plan of Care (Signed)
Problem: Education: Goal: Knowledge of General Education information will improve Description: Including pain rating scale, medication(s)/side effects and non-pharmacologic comfort measures Outcome: Progressing   Problem: Health Behavior/Discharge Planning: Goal: Ability to manage health-related needs will improve Outcome: Progressing   Problem: Clinical Measurements: Goal: Ability to maintain clinical measurements within normal limits will improve Outcome: Progressing Goal: Will remain free from infection Outcome: Progressing Goal: Diagnostic test results will improve Outcome: Progressing Goal: Respiratory complications will improve Outcome: Progressing Goal: Cardiovascular complication will be avoided Outcome: Progressing   Problem: Activity: Goal: Risk for activity intolerance will decrease Outcome: Progressing   Problem: Nutrition: Goal: Adequate nutrition will be maintained Outcome: Progressing   Problem: Coping: Goal: Level of anxiety will decrease Outcome: Progressing   Problem: Elimination: Goal: Will not experience complications related to bowel motility Outcome: Progressing Goal: Will not experience complications related to urinary retention Outcome: Progressing   Problem: Pain Managment: Goal: General experience of comfort will improve Outcome: Progressing   Problem: Safety: Goal: Ability to remain free from injury will improve Outcome: Progressing   Problem: Skin Integrity: Goal: Risk for impaired skin integrity will decrease Outcome: Progressing   Problem: Education: Goal: Understanding of CV disease, CV risk reduction, and recovery process will improve Outcome: Progressing Goal: Individualized Educational Video(s) Outcome: Progressing   Problem: Activity: Goal: Ability to return to baseline activity level will improve Outcome: Progressing   Problem: Cardiovascular: Goal: Ability to achieve and maintain adequate cardiovascular perfusion  will improve Outcome: Progressing Goal: Vascular access site(s) Level 0-1 will be maintained Outcome: Progressing   Problem: Health Behavior/Discharge Planning: Goal: Ability to safely manage health-related needs after discharge will improve Outcome: Progressing   Problem: Education: Goal: Understanding of cardiac disease, CV risk reduction, and recovery process will improve Outcome: Progressing Goal: Individualized Educational Video(s) Outcome: Progressing   Problem: Activity: Goal: Ability to tolerate increased activity will improve Outcome: Progressing   Problem: Cardiac: Goal: Ability to achieve and maintain adequate cardiovascular perfusion will improve Outcome: Progressing   Problem: Health Behavior/Discharge Planning: Goal: Ability to safely manage health-related needs after discharge will improve Outcome: Progressing   Problem: Education: Goal: Will demonstrate proper wound care and an understanding of methods to prevent future damage Outcome: Progressing Goal: Knowledge of disease or condition will improve Outcome: Progressing Goal: Knowledge of the prescribed therapeutic regimen will improve Outcome: Progressing Goal: Individualized Educational Video(s) Outcome: Progressing   Problem: Activity: Goal: Risk for activity intolerance will decrease Outcome: Progressing   Problem: Cardiac: Goal: Will achieve and/or maintain hemodynamic stability Outcome: Progressing   Problem: Clinical Measurements: Goal: Postoperative complications will be avoided or minimized Outcome: Progressing   Problem: Respiratory: Goal: Respiratory status will improve Outcome: Progressing   Problem: Skin Integrity: Goal: Wound healing without signs and symptoms of infection Outcome: Progressing Goal: Risk for impaired skin integrity will decrease Outcome: Progressing   Problem: Urinary Elimination: Goal: Ability to achieve and maintain adequate renal perfusion and functioning  will improve Outcome: Progressing   Problem: Education: Goal: Ability to describe self-care measures that may prevent or decrease complications (Diabetes Survival Skills Education) will improve Outcome: Progressing Goal: Individualized Educational Video(s) Outcome: Progressing   Problem: Coping: Goal: Ability to adjust to condition or change in health will improve Outcome: Progressing   Problem: Fluid Volume: Goal: Ability to maintain a balanced intake and output will improve Outcome: Progressing   Problem: Health Behavior/Discharge Planning: Goal: Ability to identify and utilize available resources and services will improve Outcome: Progressing Goal: Ability to manage health-related needs will improve  Outcome: Progressing   Problem: Metabolic: Goal: Ability to maintain appropriate glucose levels will improve Outcome: Progressing   Problem: Nutritional: Goal: Maintenance of adequate nutrition will improve Outcome: Progressing Goal: Progress toward achieving an optimal weight will improve Outcome: Progressing

## 2023-06-28 NOTE — Progress Notes (Addendum)
[address]       [place] [address]             [phone]      3 Days Post-Op Procedure(s) (LRB): CORONARY ARTERY BYPASS GRAFTING (CABG) x1 USING LEFT INTERNAL MAMMARY ARTERY (LIMA) (N/A) TRANSESOPHAGEAL ECHOCARDIOGRAM (N/A) Subjective: Pt states his pain has improved, but it is hard to cough up sputum.  Objective: Vital signs in last 24 hours: Temp:  [98.4 F (36.9 C)-99 F (37.2 C)] 98.4 F (36.9 C) (07/04 0758) Pulse Rate:  [69-89] 76 (07/04 0758) Cardiac Rhythm: Normal sinus rhythm (07/03 1901) Resp:  [11-24] 20 (07/04 0758) BP: (97-120)/(55-84) 115/73 (07/04 0758) SpO2:  [94 %-99 %] 97 % (07/04 0758) Weight:  [101.7 kg] 101.7 kg (07/04 0500)  Hemodynamic parameters for last 24 hours:    Intake/Output from previous day: 07/03 0701 - 07/04 0700 In: 730.7 [P.O.:560; I.V.:70.7; IV Piggyback:100] Out: 350 [Urine:350] Intake/Output this shift: No intake/output data recorded.  General appearance: alert, cooperative, and no distress Neurologic: intact Heart: regular rate and rhythm, S1, S2 normal, no murmur, click, rub or gallop Lungs: clear to auscultation bilaterally Abdomen: soft, non-tender; bowel sounds normal; no masses,  no organomegaly Extremities: edema trace Wound: Sternum is stable, did not feel any popping or clicking with cough, incision is clean and dry, No erythema, drainage or sign of infection.   Lab Results: Recent Labs    06/27/23 0427 06/28/23 0041  WBC 10.1 10.1  HGB 10.6* 10.7*  HCT 31.9* 32.3*  PLT 96* 111*   BMET:  Recent Labs    06/27/23 0427 06/28/23 0041  NA 137 138  K 4.0 4.6  CL 101 107  CO2 26 25  GLUCOSE 115* 110*  BUN 11 12  CREATININE 1.20 1.50*  CALCIUM 8.4* 8.3*    PT/INR:  Recent Labs    06/25/23 1121  LABPROT 15.9*  INR 1.2   ABG    Component Value Date/Time   PHART 7.287 (L) 06/25/2023 1644   HCO3 21.4 06/25/2023 1644   TCO2 23 06/25/2023 1644   ACIDBASEDEF 5.0 (H)  06/25/2023 1644   O2SAT 99 06/25/2023 1644   CBG (last 3)  Recent Labs    06/27/23 1721 06/27/23 2108 06/28/23 0547  GLUCAP 122* 130* 111*    Assessment/Plan: S/P Procedure(s) (LRB): CORONARY ARTERY BYPASS GRAFTING (CABG) x1 USING LEFT INTERNAL MAMMARY ARTERY (LIMA) (N/A) TRANSESOPHAGEAL ECHOCARDIOGRAM (N/A)  CV: NSR, SBP 97-115. HR 70s. On PO Amiodarone and Coreg 3.125.  Pulm: Saturating well on RA. CXR with bilateral interstitial opacities, likely due to volume overload state.  Pt states it is hard to cough up sputum, will add Mucinex.   GI: +BM, multiple bowel movements. Will transition stool softeners to PRN.   Endo: CBGs controlled, preop A1C 6.4. Continue home Farxiga and SSI PRN. Hypothyroid, continue Synthroid.   Renal: AKI, Cr 1.5 from 1.2 yesterday, possibly due to Farxiga vs volume overload? UO 350cc/24hrs recorded. +9lbs, as discussed with Dr. Cliffton Asters will hold Lasix today due to bump in creatinine.   ID: No leukocytosis, Tmax 99. Monitor.   Expected postop ABLA: Stable, H/H 10.7/32.3  Thrombocytopenia: Lovenox d/c'd. Trending up, plt 111,000 this AM  Sternum pop: Pt felt popping sensation near his sternum yesterday, Dr. Dorris Fetch noted and stable sternum and intact incision with some movement of left costal cartilage. Will get CXR tomorrow AM.   Dispo: Dispo planning.    LOS: 6 days    Jenny Reichmann,  PA-C 06/28/2023  Agree with abvoe Sternum stable PT/OT  Corliss Skains

## 2023-06-28 NOTE — Progress Notes (Signed)
Patient little agitated this morning, stated hurts little more yesterday. Still coughing out thin white sputum. When asked pt stated he doesn't need any cough medication. Scheduled tylenol and Toradol administered. Pt walked 940 feel with walker, no SOB . Felt better after walking, sitting in recliner. Call bell within reach. Plan of care continues.

## 2023-06-28 NOTE — Progress Notes (Signed)
Mobility Specialist Progress Note:   06/28/23 1300  Mobility  Activity Ambulated independently in hallway  Level of Assistance Independent  Assistive Device None  Distance Ambulated (ft) 1000 ft  Activity Response Tolerated well  Mobility Referral Yes  $Mobility charge 1 Mobility  Mobility Specialist Start Time (ACUTE ONLY) 1307  Mobility Specialist Stop Time (ACUTE ONLY) 1315  Mobility Specialist Time Calculation (min) (ACUTE ONLY) 8 min    Pre Mobility: 89 HR During Mobility: 104 HR Post Mobility:  100 HR  Pt received on EOB, agreeable to mobility. Independent w/ cues for sternal precautions for sit to stand. Ambulated in hallway w/ no AD, under SV. Asymptomatic and HR stable throughout. Pt left in chair with call bell and family present.  D'Vante Earlene Plater Mobility Specialist Please contact via Special educational needs teacher or Rehab office at [phone]

## 2023-06-29 ENCOUNTER — Inpatient Hospital Stay (HOSPITAL_COMMUNITY): Payer: BC Managed Care – PPO

## 2023-06-29 ENCOUNTER — Other Ambulatory Visit: Payer: Self-pay

## 2023-06-29 LAB — BASIC METABOLIC PANEL
Anion gap: 9 (ref 5–15)
BUN: 18 mg/dL (ref 6–20)
CO2: 23 mmol/L (ref 22–32)
Calcium: 8.4 mg/dL — ABNORMAL LOW (ref 8.9–10.3)
Chloride: 105 mmol/L (ref 98–111)
Creatinine, Ser: 1.34 mg/dL — ABNORMAL HIGH (ref 0.61–1.24)
GFR, Estimated: >60 mL/min (ref >60)
Glucose, Bld: 117 mg/dL — ABNORMAL HIGH (ref 70–99)
Potassium: 3.7 mmol/L (ref 3.5–5.1)
Sodium: 137 mmol/L (ref 135–145)

## 2023-06-29 LAB — CBC
HCT: 33.7 % — ABNORMAL LOW (ref 39.0–52.0)
Hemoglobin: 11.3 g/dL — ABNORMAL LOW (ref 13.0–17.0)
MCH: 31.7 pg (ref 26.0–34.0)
MCHC: 33.5 g/dL (ref 30.0–36.0)
MCV: 94.7 fL (ref 80.0–100.0)
Platelets: 131 10*3/uL — ABNORMAL LOW (ref 150–400)
RBC: 3.56 MIL/uL — ABNORMAL LOW (ref 4.22–5.81)
RDW: 13.9 % (ref 11.5–15.5)
WBC: 8.1 10*3/uL (ref 4.0–10.5)
nRBC: 0 % (ref 0.0–0.2)

## 2023-06-29 LAB — GLUCOSE, CAPILLARY: Glucose-Capillary: 104 mg/dL — ABNORMAL HIGH (ref 70–99)

## 2023-06-29 MED ORDER — ASPIRIN 81 MG PO CHEW: 81.0000 mg | CHEWABLE_TABLET | Freq: Every day | ORAL | Status: DC

## 2023-06-29 MED ORDER — AMIODARONE HCL 200 MG PO TABS
ORAL_TABLET | ORAL | 1 refills | Status: DC
  Filled 2023-06-29: qty 60, fill #0

## 2023-06-29 MED ORDER — DIPHENHYDRAMINE HCL 25 MG PO CAPS
25.0000 mg | ORAL_CAPSULE | Freq: Once | ORAL | Status: AC
  Administered 2023-06-29: 25 mg via ORAL
  Filled 2023-06-29: qty 1

## 2023-06-29 MED ORDER — AMIODARONE HCL 200 MG PO TABS
ORAL_TABLET | ORAL | 1 refills | Status: DC
  Filled 2023-06-29: qty 60, 50d supply, fill #0
  Filled 2023-06-29: qty 18, 9d supply, fill #0
  Filled 2023-07-25 – 2023-08-06 (×2): qty 60, 60d supply, fill #1

## 2023-06-29 MED ORDER — OXYCODONE HCL 5 MG PO TABS
5.0000 mg | ORAL_TABLET | Freq: Four times a day (QID) | ORAL | 0 refills | Status: DC | PRN
  Filled 2023-06-29: qty 28, 7d supply, fill #0

## 2023-06-29 MED ORDER — ATORVASTATIN CALCIUM 80 MG PO TABS
80.0000 mg | ORAL_TABLET | Freq: Every day | ORAL | 1 refills | Status: DC
  Filled 2023-06-29: qty 30, 30d supply, fill #0

## 2023-06-29 MED ORDER — ASPIRIN 81 MG PO TBEC
81.0000 mg | DELAYED_RELEASE_TABLET | Freq: Every day | ORAL | Status: DC
  Administered 2023-06-29: 81 mg via ORAL
  Filled 2023-06-29: qty 1

## 2023-06-29 MED ORDER — POTASSIUM CHLORIDE CRYS ER 20 MEQ PO TBCR
40.0000 meq | EXTENDED_RELEASE_TABLET | Freq: Once | ORAL | Status: AC
  Administered 2023-06-29: 40 meq via ORAL
  Filled 2023-06-29: qty 2

## 2023-06-29 MED ORDER — ATORVASTATIN CALCIUM 20 MG PO TABS
20.0000 mg | ORAL_TABLET | Freq: Every day | ORAL | 0 refills | Status: DC
  Filled 2023-06-29 (×2): qty 90, 90d supply, fill #0

## 2023-06-29 MED FILL — Heparin Sodium (Porcine) Inj 1000 Unit/ML: Qty: 1000 | Status: AC

## 2023-06-29 MED FILL — Magnesium Sulfate Inj 50%: INTRAMUSCULAR | Qty: 10 | Status: AC

## 2023-06-29 MED FILL — Potassium Chloride Inj 2 mEq/ML: INTRAVENOUS | Qty: 40 | Status: AC

## 2023-06-29 NOTE — Progress Notes (Signed)
Discharge instructions reviewed with pt and his wife. They both had questions about the dosage of the lipitor, dose was increased to 80mg , pt has been on lipitor 20mg  daily with also taking ezetimibe and Repatha at home, with great Lipid panel results. Also needed to clarify the amiodarone dosage and frequency, call placed to the PA on call, Erin.  Pt's primary RN reached out to [place], [place] and discussed the above with her. The amiodarone dosage was corrected, and after discussion pt will continue his lipitor dose of 20mg  at home and pt/wife will reach out to the surgeon and his cardiologist  to discuss the lipitor dosage.      Copy of instructions given to pt. Pt informed his scripts were sent to his pharmacy for pick up.  Pt to be d/c'd via wheelchair with belongings, with his wife.            To be escorted by hospital volunteer.   Annice Needy, RN SWOT

## 2023-06-29 NOTE — Progress Notes (Addendum)
      [address]       [place] [address]             [phone]        4 Days Post-Op Procedure(s) (LRB): CORONARY ARTERY BYPASS GRAFTING (CABG) x1 USING LEFT INTERNAL MAMMARY ARTERY (LIMA) (N/A) TRANSESOPHAGEAL ECHOCARDIOGRAM (N/A)  Subjective: Patient not sleeping. He hopes to go home. Has sternal/incisional pain.  Objective: Vital signs in last 24 hours: Temp:  [98.3 F (36.8 C)-99.1 F (37.3 C)] 98.3 F (36.8 C) (07/05 0633) Pulse Rate:  [72-83] 75 (07/05 0633) Cardiac Rhythm: Normal sinus rhythm (07/04 1901) Resp:  [16-24] 24 (07/05 0633) BP: (102-135)/(66-82) 135/82 (07/05 0633) SpO2:  [96 %-99 %] 98 % (07/05 0633) Weight:  [101.6 kg] 101.6 kg (07/05 0600)  Pre op weight 101.6 kg (?) Current Weight  06/29/23 101.6 kg    Intake/Output from previous day: No intake/output data recorded.   Physical Exam:  Cardiovascular: RRR Pulmonary: Clear to auscultation bilaterally Abdomen: Soft, non tender, bowel sounds present. Extremities: Trace bilateral lower extremity edema. Wounds: Clean and dry.  No erythema or signs of infection. Sternum is stable. There is a clicking/popping to left of sternum upper rib area with cough.  Lab Results: CBC: Recent Labs    06/28/23 0041 06/29/23 0112  WBC 10.1 8.1  HGB 10.7* 11.3*  HCT 32.3* 33.7*  PLT 111* 131*   BMET:  Recent Labs    06/28/23 0041 06/29/23 0112  NA 138 137  K 4.6 3.7  CL 107 105  CO2 25 23  GLUCOSE 110* 117*  BUN 12 18  CREATININE 1.50* 1.34*  CALCIUM 8.3* 8.4*    PT/INR:  Lab Results  Component Value Date   INR 1.2 06/25/2023   INR 1.0 06/23/2023   ABG:  INR: Will add last result for INR, ABG once components are confirmed Will add last 4 CBG results once components are confirmed  Assessment/Plan:  1. CV - History of a fib with RVR. Maintaining SR. On Amiodarone 400 mg bid and Coreg 3.125 mg bid. Will decrease ec asa to 81 mg and restart Apixaban. 2.  Pulmonary -  On room air. PA/LAT CXR this am appears stable (no broken wires, no pneumothorax). Encourage incentive spirometer. 3. History of CHF- On Farxiga 10 mg daily 4.  Expected post op acute blood loss anemia - H and H this am stable at 11.3 and 33.7 5. Borderline diabetes-CBGs 153/119/104. Pre op HGA1C 6.4. Will need follow up with medical doctor. 6. History of hypothyroidism-continue Levothyroxine 137 mcg daily 7. Thrombocytopenia-platelets this am up to 131,000 8. Creatinine this am decreased from 1.5 to 1.34. 9. Supplement potassium 10. Hope to discharge  Lelon Huh [facility] 6:57 AM  Patient seen and examined, agree with above CXR Ok, wires in place Sternum stable on exam Dc home today  Salvatore Decent. Dorris Fetch, MD [facility] [phone]

## 2023-06-29 NOTE — Progress Notes (Signed)
Pt received [facility] book and education on restrictions, heart healthy diet, ex guidelines, Move in the Tube sheet, incentive spirometer use when d/c and CRPII. Pt denies questions and was encouraged to look in the book for additional information. Referral placed to [facility].   Referral placed to: [facility] under cardiologist Dr. Julien Nordmann Qualifying Dx: 7/1  CABGx1  Faustino Congress 06/29/2023 8:59 AM

## 2023-07-02 ENCOUNTER — Telehealth: Payer: Self-pay | Admitting: *Deleted

## 2023-07-02 NOTE — Transitions of Care (Post Inpatient/ED Visit) (Signed)
   07/02/2023  Name: Bobby [name] MRN: [id number] DOB: 1971-12-09  Today's TOC FU Call Status: Today's TOC FU Call Status:: Unsuccessful Call (2nd Attempt) Unsuccessful Call (2nd Attempt) Date: 07/02/23  Attempted to reach the patient regarding the most recent Inpatient/ED visit.  Follow Up Plan: Additional outreach attempts will be made to reach the patient to complete the Transitions of Care (Post Inpatient/ED visit) call.   [name] BSN RN [facility] [phone]

## 2023-07-03 ENCOUNTER — Telehealth: Payer: Self-pay | Admitting: *Deleted

## 2023-07-03 NOTE — Transitions of Care (Post Inpatient/ED Visit) (Signed)
07/03/2023  Name: Bobby [name] MRN: [id number] DOB: 07-30-1971  Today's TOC FU Call Status: Today's TOC FU Call Status:: Successful TOC FU Call Competed TOC FU Call Complete Date: 07/03/23  Transition Care Management Follow-up Telephone Call Date of Discharge: 06/29/23 Discharge Facility: [name]) Type of Discharge: Inpatient Admission Primary Inpatient Discharge Diagnosis:: unstable angina How have you been since you were released from the hospital?: Better (doing pretty good. I have walked 10-15 minutes 3 X a day. I am still sore) Any questions or concerns?: No  Items Reviewed: Did you receive and understand the discharge instructions provided?: Yes Medications obtained,verified, and reconciled?: Yes (Medications Reviewed) Any new allergies since your discharge?: No Dietary orders reviewed?: No Do you have support at home?: Yes People in Home: spouse Name of Support/Comfort Primary Source: Bobby [name]  Medications Reviewed Today: Medications Reviewed Today     Reviewed by [name], RN (Case Manager) on 07/03/23 at 1357  Med List Status: <None>   Medication Order Taking? Sig Documenting Provider Last Dose Status Informant  ALPRAZolam (XANAX) 0.5 MG tablet [id number]  Take 1 tablet (0.5 mg total) by mouth at bedtime as needed.  Patient not taking: Reported on 06/22/2023   Dana Allan, MD  Active Self  amiodarone (PACERONE) 200 MG tablet [id number] Yes Take 1 tablet (200 mg total) by mouth 2 (two) times daily for 10 days, THEN 1 tablet (200 mg total) daily thereafter. Doree Fudge M, PA-C Taking Active   apixaban (ELIQUIS) 5 MG TABS tablet [id number]  Take 1 tablet (5 mg total) by mouth 2 (two) times daily. Await additional instructions once at [facility], Cadence H, PA-C  Active Self           Med Note (SATTERFIELD, Genoveva Ill   Fri Jun 22, 2023  8:36 PM) ON HOLD PER PATIENT   aspirin 81 MG chewable tablet [id number] Yes Chew 1 tablet (81 mg total) by mouth  daily. Patwardhan, Anabel Bene, MD Taking Active Self  atorvastatin (LIPITOR) 20 MG tablet [id number] Yes Take 1 tablet (20 mg total) by mouth daily. Barrett, Rae Roam, PA-C Taking Active   carvedilol (COREG) 3.125 MG tablet [id number] Yes Take 1 tablet (3.125 mg total) by mouth 2 (two) times daily. Antonieta Iba, MD Taking Active Self  dapagliflozin propanediol (FARXIGA) 10 MG TABS tablet [id number]  Take 1 tablet (10 mg total) by mouth daily before breakfast. Elder Negus, MD  Active Self           Med Note (SATTERFIELD, Genoveva Ill   Fri Jun 22, 2023  8:35 PM) ON HOLD     Discontinued 01/24/22 0800 (External Source Cancellation)   Evolocumab (REPATHA SURECLICK) 140 MG/ML SOAJ [id number] Yes inject 140mg  every 14 days  Patient taking differently: Inject 140 mg into the skin every 14 (fourteen) days.   Yates Decamp, MD Taking Active Self  ezetimibe (ZETIA) 10 MG tablet [id number] Yes Take 1 tablet (10 mg total) by mouth daily. Patwardhan, Anabel Bene, MD Taking Active Self  levothyroxine (SYNTHROID) 137 MCG tablet [id number] Yes Take 1 tablet (137 mcg total) by mouth daily. Dana Allan, MD Taking Active Self  oxyCODONE (OXY IR/ROXICODONE) 5 MG immediate release tablet [id number] Yes Take 1 tablet (5 mg total) by mouth every 6 (six) hours as needed for severe pain. Doree Fudge M, PA-C Taking Active   pantoprazole (PROTONIX) 40 MG tablet [id number] Yes Take 1 tablet (40 mg total) by mouth 2 (two) times daily. Dana Allan, MD Taking  Active Self  sildenafil (VIAGRA) 100 MG tablet [id number] Yes Take 1 tablet (100 mg total) by mouth as needed for erectile dysfunction. TAKE 1 (ONE) TABLET AS NEEDED. DO NOT USE AT SAME TIME AS NITRO Patwardhan, Anabel Bene, MD Taking Active Self            Home Care and Equipment/Supplies: Were Home Health Services Ordered?: NA  Functional Questionnaire: Do you need assistance with bathing/showering or dressing?: Yes Do you need assistance with meal preparation?:  Yes Do you need assistance with eating?: No Do you have difficulty maintaining continence: No Do you need assistance with getting out of bed/getting out of a chair/moving?: No Do you have difficulty managing or taking your medications?: No  Follow up appointments reviewed: PCP Follow-up appointment confirmed?: No MD Provider Line [phone] Given: Yes (Patient will make follow up appt with PCP.) Specialist Hospital Follow-up appointment confirmed?: Yes Date of Specialist follow-up appointment?: 07/24/23 Follow-Up Specialty Provider:: [id number] Cadence Finley, [id number] imaging then Dr Dorris Fetch. Do you need transportation to your follow-up appointment?: No Do you understand care options if your condition(s) worsen?: Yes-patient verbalized understanding   Interventions Today    Flowsheet Row Most Recent Value  General Interventions   General Interventions Discussed/Reviewed General Interventions Discussed, General Interventions Reviewed  Exercise Interventions   Exercise Discussed/Reviewed Exercise Discussed  [Walking 10-15 min 3 x day]  Pharmacy Interventions   Pharmacy Dicussed/Reviewed Pharmacy Topics Discussed      TOC Interventions Today    Flowsheet Row Most Recent Value  TOC Interventions   TOC Interventions Discussed/Reviewed TOC Interventions Discussed, TOC Interventions Reviewed        [name] BSN RN [facility] [phone]

## 2023-07-04 ENCOUNTER — Encounter: Payer: Self-pay | Admitting: Thoracic Surgery (Cardiothoracic Vascular Surgery)

## 2023-07-05 MED FILL — Electrolyte-R (PH 7.4) Solution: INTRAVENOUS | Qty: 3000 | Status: AC

## 2023-07-05 MED FILL — Heparin Sodium (Porcine) Inj 1000 Unit/ML: INTRAMUSCULAR | Qty: 30 | Status: AC

## 2023-07-05 MED FILL — Mannitol IV Soln 20%: INTRAVENOUS | Qty: 500 | Status: AC

## 2023-07-05 MED FILL — Calcium Chloride Inj 10%: INTRAVENOUS | Qty: 10 | Status: AC

## 2023-07-05 MED FILL — Sodium Bicarbonate IV Soln 8.4%: INTRAVENOUS | Qty: 50 | Status: AC

## 2023-07-05 MED FILL — Lidocaine HCl Local Soln Prefilled Syringe 100 MG/5ML (2%): INTRAMUSCULAR | Qty: 5 | Status: AC

## 2023-07-06 ENCOUNTER — Telehealth: Payer: Self-pay | Admitting: *Deleted

## 2023-07-06 NOTE — Telephone Encounter (Signed)
VM left on my direct line regarding drainage from incision from patient's wife. Wife stated a message was also sent via MyChart with photos attached. Returned phone call to wife. Wife states there is a tiny opening along midline incision. States drainage has since decreased since yesterdays photos. Denies redness or fevers. Photos reviewed by Dr. Dorris Fetch. Patient advised per MD recommendations to keep an eye on the area. Advised wife to contact our office if symptoms worsen. Wife verbalized understanding.

## 2023-07-09 ENCOUNTER — Telehealth: Payer: Self-pay

## 2023-07-09 ENCOUNTER — Ambulatory Visit: Payer: 59 | Admitting: Cardiovascular Disease

## 2023-07-09 NOTE — Telephone Encounter (Signed)
STD form completed and faxed to the Principal @1800 [phone]. Beginning LOA 06/22/23 through 09/17/23. DOS 06/25/23.

## 2023-07-10 ENCOUNTER — Other Ambulatory Visit: Payer: Self-pay | Admitting: Family Medicine

## 2023-07-11 ENCOUNTER — Encounter: Payer: Self-pay | Admitting: Family Medicine

## 2023-07-11 ENCOUNTER — Other Ambulatory Visit: Payer: Self-pay

## 2023-07-11 MED FILL — Dapagliflozin Propanediol Tab 10 MG (Base Equivalent): ORAL | 30 days supply | Qty: 30 | Fill #4 | Status: AC

## 2023-07-12 ENCOUNTER — Other Ambulatory Visit: Payer: Self-pay

## 2023-07-12 ENCOUNTER — Other Ambulatory Visit: Payer: Self-pay | Admitting: Family Medicine

## 2023-07-12 NOTE — Telephone Encounter (Signed)
TSH drawn 3 weeks ago from a hospital encounter  It was: 4.699 High    Please advise if pt should stay on of Levothyroxine.

## 2023-07-13 ENCOUNTER — Other Ambulatory Visit: Payer: Self-pay

## 2023-07-13 MED FILL — Levothyroxine Sodium Tab 137 MCG: ORAL | 90 days supply | Qty: 90 | Fill #0 | Status: AC

## 2023-07-20 ENCOUNTER — Telehealth: Payer: BC Managed Care – PPO | Admitting: Nurse Practitioner

## 2023-07-20 ENCOUNTER — Encounter: Payer: Self-pay | Admitting: Nurse Practitioner

## 2023-07-20 ENCOUNTER — Encounter: Payer: Self-pay | Admitting: Family Medicine

## 2023-07-20 VITALS — HR 85 | Ht 73.0 in | Wt 218.0 lb

## 2023-07-20 DIAGNOSIS — J069 Acute upper respiratory infection, unspecified: Secondary | ICD-10-CM | POA: Diagnosis not present

## 2023-07-20 DIAGNOSIS — Z0000 Encounter for general adult medical examination without abnormal findings: Secondary | ICD-10-CM | POA: Insufficient documentation

## 2023-07-20 MED ORDER — GUAIFENESIN-CODEINE 100-10 MG/5ML PO SYRP: ORAL_SOLUTION | ORAL | 0 refills | Status: DC

## 2023-07-20 MED ORDER — BENZONATATE 100 MG PO CAPS: 100.0000 mg | ORAL_CAPSULE | Freq: Three times a day (TID) | ORAL | 1 refills | Status: DC | PRN

## 2023-07-20 NOTE — Assessment & Plan Note (Addendum)
HCM Hepatitis C/HIV screening completed PSA screening completed.  PSA normal Colonoscopy up-to-date.  Polyp removed.  Recommend 5-year follow-up.  Due 02/2027.  Follows with Dr. Elnoria Howard Shingles vaccine up-to-date Tdap up-to-date. Recommend pneumonia 20 vaccine.

## 2023-07-20 NOTE — Assessment & Plan Note (Signed)
Chronic.  Recent TSH within normal limits on 06/19.  During admission for new onset A-fib had increased TSH 4.699 on 06/21.  Likely falsely elevated Continue Synthroid 137 mcg daily, refill x 90 tabs Check TSH in 3 months

## 2023-07-20 NOTE — Assessment & Plan Note (Addendum)
Chronic.   Did not like side effect of Xanax. Discussed with patient provider could not take back medication.  Recommend he return to pharmacy and use medications for proper dispensing Discontinue Xanax Encourage CBT Offered SSRI, declined as did not like side effects from previous SSRIs Offered psychiatry evaluation, politely declined at this time Mental health resources provided Follow-up as needed

## 2023-07-20 NOTE — Assessment & Plan Note (Signed)
>>  ASSESSMENT AND PLAN FOR DEPRESSION, MAJOR, RECURRENT (HCC) WRITTEN ON 07/20/2023  9:05 PM BY WALSH, TANYA, MD  Chronic.   Did not like side effect of Xanax . Discussed with patient provider could not take back medication.  Recommend he return to pharmacy and use medications for proper dispensing Discontinue Xanax  Encourage CBT Offered SSRI, declined as did not like side effects from previous SSRIs Offered psychiatry evaluation, politely declined at this time Mental health resources provided Follow-up as needed

## 2023-07-20 NOTE — Assessment & Plan Note (Signed)
New onset.  Not currently symptomatic.  Recently started anticoagulation therapy Taking Eliquis 5 mg twice daily and tolerating well.  No signs of bleeding. Plan for PTCA 06/28 Follow-up with cardiology as scheduled Strict return precautions provided.

## 2023-07-20 NOTE — Progress Notes (Signed)
Virtual Visit via Video Note  I connected with Bobby [name] on 07/20/2023 at 4:25 PM by a video enabled telemedicine application and verified that I am speaking with the correct person using two identifiers.  Patient Location: Home Provider Location: Office/Clinic  I discussed the limitations, risks, security, and privacy concerns of performing an evaluation and management service by video and the availability of in person appointments. I also discussed with the patient that there may be a patient responsible charge related to this service. The patient expressed understanding and agreed to proceed.  Subjective: PCP: Bobby Allan, MD  Chief Complaint  Patient presents with   Covid Exposure    Wife tested +this am pt had bypass on 7/1   HPI  Patient had bypass surgery on 06/25/23. His wife tested positive for COVID this morning. Pt states that he had headache and sinus drainage that started today.  He tested negative for COVID today. Denies sore throat or fever.  Patient states that he do not want to get sick and cough.   ROS: Per HPI  Current Outpatient Medications:    amiodarone (PACERONE) 200 MG tablet, Take 1 tablet (200 mg total) by mouth 2 (two) times daily for 10 days, THEN 1 tablet (200 mg total) daily thereafter., Disp: 60 tablet, Rfl: 1   aspirin 81 MG chewable tablet, Chew 1 tablet (81 mg total) by mouth daily., Disp: 30 tablet, Rfl: 11   atorvastatin (LIPITOR) 20 MG tablet, Take 1 tablet (20 mg total) by mouth daily., Disp: 90 tablet, Rfl: 0   carvedilol (COREG) 3.125 MG tablet, Take 1 tablet (3.125 mg total) by mouth 2 (two) times daily., Disp: 180 tablet, Rfl: 3   dapagliflozin propanediol (FARXIGA) 10 MG TABS tablet, Take 1 tablet (10 mg total) by mouth daily before breakfast., Disp: 90 tablet, Rfl: 2   levothyroxine (SYNTHROID) 137 MCG tablet, Take 1 tablet (137 mcg total) by mouth daily., Disp: 90 tablet, Rfl: 0   pantoprazole (PROTONIX) 40 MG tablet, Take 1 tablet (40  mg total) by mouth 2 (two) times daily., Disp: 60 tablet, Rfl: 3   sildenafil (VIAGRA) 100 MG tablet, Take 1 tablet (100 mg total) by mouth as needed for erectile dysfunction. TAKE 1 (ONE) TABLET AS NEEDED. DO NOT USE AT SAME TIME AS NITRO, Disp: 30 tablet, Rfl: 3   apixaban (ELIQUIS) 5 MG TABS tablet, Take 1 tablet (5 mg total) by mouth 2 (two) times daily., Disp: 60 tablet, Rfl: 5   ezetimibe (ZETIA) 10 MG tablet, Take 1 tablet (10 mg total) by mouth daily., Disp: 90 tablet, Rfl: 0  Observations/Objective: Today's Vitals   07/20/23 1601  Pulse: 85  Weight: 218 lb (98.9 kg)  Height: 6\' 1"  (1.854 m)   Physical Exam Constitutional:      General: He is not in acute distress.    Appearance: Normal appearance. He is not ill-appearing.  Eyes:     Conjunctiva/sclera: Conjunctivae normal.  Pulmonary:     Effort: No respiratory distress.  Neurological:     Mental Status: He is alert.  Psychiatric:        Mood and Affect: Mood normal.        Behavior: Behavior normal.        Thought Content: Thought content normal.        Judgment: Judgment normal.     Assessment and Plan: Upper respiratory tract infection, unspecified type Assessment & Plan: Symptomatic treatment discussed. Benzonatate Perles and cough syrup for bedtime. Adequate hydration  and rest.       Follow Up Instructions: No follow-ups on file.   I discussed the assessment and treatment plan with the patient. The patient was provided an opportunity to ask questions, and all were answered. The patient agreed with the plan and demonstrated an understanding of the instructions.   The patient was advised to call back or seek an in-person evaluation if the symptoms worsen or if the condition fails to improve as anticipated.  The above assessment and management plan was discussed with the patient. The patient verbalized understanding of and has agreed to the management plan.   Bobby Dies, NP

## 2023-07-23 ENCOUNTER — Other Ambulatory Visit: Payer: Self-pay

## 2023-07-23 ENCOUNTER — Other Ambulatory Visit: Payer: Self-pay | Admitting: *Deleted

## 2023-07-23 ENCOUNTER — Ambulatory Visit: Admission: RE | Admit: 2023-07-23 | Payer: 59 | Source: Ambulatory Visit

## 2023-07-23 ENCOUNTER — Encounter: Payer: Self-pay | Admitting: Thoracic Surgery (Cardiothoracic Vascular Surgery)

## 2023-07-23 ENCOUNTER — Ambulatory Visit (INDEPENDENT_AMBULATORY_CARE_PROVIDER_SITE_OTHER): Payer: BC Managed Care – PPO | Admitting: Thoracic Surgery (Cardiothoracic Vascular Surgery)

## 2023-07-23 VITALS — BP 149/92 | HR 75 | Resp 20 | Ht 73.0 in | Wt 218.0 lb

## 2023-07-23 DIAGNOSIS — Z951 Presence of aortocoronary bypass graft: Secondary | ICD-10-CM

## 2023-07-23 DIAGNOSIS — I2510 Atherosclerotic heart disease of native coronary artery without angina pectoris: Secondary | ICD-10-CM | POA: Diagnosis not present

## 2023-07-23 DIAGNOSIS — Z5189 Encounter for other specified aftercare: Secondary | ICD-10-CM

## 2023-07-23 NOTE — Progress Notes (Signed)
[address]       [place] [address]             [phone]     HPI: Mr. Ham returns for follow-up after coronary bypass grafting with concerns about his sternal wound.  Shri Enfinger is a 52 year old man with a history of hypertension, hyperlipidemia, paroxysmal atrial fibrillation, coronary disease dating back to 2003, HFrEF, and hypothyroidism.  He presented in June with chest pain.  Catheterization showed an ostial 90% stenosis in the circumflex as well as a chronic total occlusion of his right coronary.  He underwent coronary bypass grafting x 1 with a LIMA to OM1 on 06/25/2023.  Postoperatively noted some clicking in the sternum.  He went home on day 4.  He has some swelling along the incision on 07/06/2023 but did not have any redness or fevers.  He had some drainage from that area.  Then over the weekend he noticed that the skin had separated over part of the incision.  Still no fevers, chills, sweats.  Past Medical History:  Diagnosis Date   Acute MI anterior wall first episode care (HCC) 07/13/2017   Occluded ostial LAD S/P 3.5x26 Onyx DES. RI 80%. RCA Occluded at prior stent site with collaterals    Cardiogenic shock (HCC) 07/13/2017   CHF (congestive heart failure) (HCC)    Graves disease    Hypertension    MI (myocardial infarction) (HCC)    Thyroid disease    thyroid irradiated per pt    Current Outpatient Medications  Medication Sig Dispense Refill   amiodarone (PACERONE) 200 MG tablet Take 1 tablet (200 mg total) by mouth 2 (two) times daily for 10 days, THEN 1 tablet (200 mg total) daily thereafter. 60 tablet 1   apixaban (ELIQUIS) 5 MG TABS tablet Take 1 tablet (5 mg total) by mouth 2 (two) times daily. Await additional instructions once at [facility] 60 tablet 0   aspirin 81 MG chewable tablet Chew 1 tablet (81 mg total) by mouth daily. 30 tablet 11   atorvastatin (LIPITOR) 20 MG tablet Take 1 tablet (20 mg total) by mouth daily. 90 tablet 0    benzonatate (TESSALON PERLES) 100 MG capsule Take 1 capsule (100 mg total) by mouth 3 (three) times daily as needed for cough. 30 capsule 1   carvedilol (COREG) 3.125 MG tablet Take 1 tablet (3.125 mg total) by mouth 2 (two) times daily. 180 tablet 3   dapagliflozin propanediol (FARXIGA) 10 MG TABS tablet Take 1 tablet (10 mg total) by mouth daily before breakfast. 90 tablet 2   ezetimibe (ZETIA) 10 MG tablet Take 1 tablet (10 mg total) by mouth daily. 90 tablet 0   guaiFENesin-codeine (ROBITUSSIN AC) 100-10 MG/5ML syrup 5 ml at  bed time as needed. 120 mL 0   levothyroxine (SYNTHROID) 137 MCG tablet Take 1 tablet (137 mcg total) by mouth daily. 90 tablet 0   pantoprazole (PROTONIX) 40 MG tablet Take 1 tablet (40 mg total) by mouth 2 (two) times daily. 60 tablet 3   sildenafil (VIAGRA) 100 MG tablet Take 1 tablet (100 mg total) by mouth as needed for erectile dysfunction. TAKE 1 (ONE) TABLET AS NEEDED. DO NOT USE AT SAME TIME AS NITRO 30 tablet 3   No current facility-administered medications for this visit.    Physical Exam BP (!) 149/92 (BP Location: Left Arm, Patient Position: Sitting)   Pulse 75   Resp 20   Ht 6\' 1"  (  1.854 m)   Wt 218 lb (98.9 kg)   SpO2 97% Comment: RA  BMI 28.78 kg/m  52 year old man in no acute distress Alert and oriented x 3 with no focal deficits Lungs clear Sternal incision with superficial dehiscence just below the sternal manubrial junction approximately 4 cm in length x 8 mm in depth x 8 mm in width.  Underlying tissue granulating.  No exposed bone or wires.  Diagnostic Tests: CHEST - 2 VIEW   COMPARISON:  06/29/2023   FINDINGS: Frontal and lateral views of the chest demonstrate postsurgical changes from median sternotomy. The cardiac silhouette is unremarkable. No airspace disease, effusion, or pneumothorax. No acute bony abnormalities.   IMPRESSION: 1. No acute intrathoracic process.     Electronically Signed   By: Sharlet Salina M.D.    On: 07/23/2023 15:45 Postoperative changes noted.  Impression: Emiliano Bobby [name] is a 52 year old man with a history of hypertension, hyperlipidemia, paroxysmal atrial fibrillation, coronary disease dating back to 2003, HFrEF, and hypothyroidism.  He presented in June with chest pain.  Catheterization showed an ostial 90% stenosis in the circumflex as well as a chronic total occlusion of his right coronary.  Underwent coronary bypass grafting x 1 about a month ago.  Now has a superficial wound dehiscence.  The wound was open with 3 small skin bridges which were easily broken up.  Underlying tissue is granulating.  There was some suture material which was removed.  Cleaned with peroxide and then saline wet-to-dry dressing placed.  No evidence of infection or indication for systemic antibiotics.  Does need local wound care with normal saline wet-to-dry dressing changes twice daily.  He may drive.  Should not lift anything over 10 pounds for at least another 2 weeks.  Plan: Normal saline wet-to-dry dressing changes to sternal wound twice daily Return for scheduled visit on 07/31/2023  Loreli Slot, MD [facility] [phone]

## 2023-07-24 ENCOUNTER — Ambulatory Visit: Payer: BC Managed Care – PPO | Admitting: Medical

## 2023-07-24 ENCOUNTER — Telehealth: Payer: Self-pay | Admitting: Cardiovascular Disease

## 2023-07-24 NOTE — Telephone Encounter (Signed)
  The patient's wife called and mentioned that the patient woke up feeling unwell and tested positive for COVID-19. She would like to ask Dr. Mariah Milling if the patient can take the antiviral medication.

## 2023-07-24 NOTE — Telephone Encounter (Signed)
Paxlovid is contraindicated to use with his amiodarone. It also has a major drug interaction with his Eliquis, atorvastatin, and Viagra.

## 2023-07-24 NOTE — Telephone Encounter (Signed)
Pt's wife made aware and verbalized understanding.

## 2023-07-24 NOTE — Progress Notes (Deleted)
Cardiology Office Note:    Date:  07/24/2023   ID:  Bobby [name], DOB December 25, 1971, MRN [id number]  PCP:  Bobby Allan, MD  [facility] Cardiologist:  Bobby Nordmann, MD  [facility] Electrophysiologist:  None   Referring MD: Bobby Allan, MD   Chief Complaint: ***  History of Present Illness:    Bobby [name] is a 52 y.o. male with a hx of ypotestosterone, hypothyroidism, h/o smoking, CAD with prior stenting, HFmrEF 40-45%, ICM, Afib on Eliquis,  HLD who presented with outpatient cardiac catheterization.    Patient previously followed with Dr. Rosemary [name]. He has a complicated CAD history with remote stenting x2 o the RCA in 2003. In July 2018, the patient presented as Anterior STEMI. Cardiac cath showed occluded RCA and 100% pLAD disease, 80% diagonal disease, 70% OM disease with collateral from L>R. LAD was stented. Echo showed LVEF 30-35%. Repeat cath in August 2018 showed patent LAD stent, occluded pRCA ISR, 50% ramus disease. Repeat coronary angiography in 07/2020 showed patent ostial LAD stent, 20% ostial diag stenosis,  20% LCX OM1 stenosis, prox in stent CTO with L>L collaterals to mRCA. Medical management was recommended. Echo 06/2020 showed LVEF 40-45%.    In 07/2022 the patient switched to Dr. Mariah [name]. Stress test 07/2022 showed abnormal perfusion with a fixed defect, LVEF 41%, overall intermediate risk.   The patient was seen in the ER 06/15/23 for chest pain found to have new onset Afib started on Eliquis 5mg  BID. The patient spontaneously converted in the ER. HS trop up to 97.   The patient was seen in the office 06/18/23 for new diagnosis of Afib in the ER. He complained of chest pain and was set up for outpatient cardiac catheterization.  Patient underwent right and left cardiac cath 06/22/2023.  Cath showed significant two-vessel CAD, including 90% ostial left circumflex stenosis (ostium jailed by pLAD stent extending back into the distal LMCA) and CTO of the P RCA stent, widely  patent ostial/proximal LAD stent, LVEF 25 to 30%, up from normal to mildly elevated left and right heart filling pressures, low normal to mildly reduced CO, angiographically normal abdominal aorta, iliac arteries and right common femoral artery.  Patient was transferred to [facility] for CABG consultation.  Patient underwent CABG x 1 with LIMA to OM1 on Monday, 06/25/2023.  Echo showed LVEF 25 to 30%, mild LVH, grade 1 diastolic dysfunction, mildly reduced RV function.  Patient was started on Plavix.  Patient had minimal AKI.  Patient had thrombocytopenia and aspirin was decreased to 81 mg daily.  Today,  Past Medical History:  Diagnosis Date   Acute MI anterior wall first episode care (HCC) 07/13/2017   Occluded ostial LAD S/P 3.5x26 Onyx DES. RI 80%. RCA Occluded at prior stent site with collaterals    Cardiogenic shock (HCC) 07/13/2017   CHF (congestive heart failure) (HCC)    Graves disease    Hypertension    MI (myocardial infarction) (HCC)    Thyroid disease    thyroid irradiated per pt    Past Surgical History:  Procedure Laterality Date   ABDOMINAL AORTOGRAM N/A 06/22/2023   Procedure: ABDOMINAL AORTOGRAM;  Surgeon: Yvonne Kendall, MD;  Location: [facility] INVASIVE CV LAB;  Service: Cardiovascular;  Laterality: N/A;   CORONARY ANGIOPLASTY WITH STENT PLACEMENT     CORONARY ARTERY BYPASS GRAFT N/A 06/25/2023   Procedure: CORONARY ARTERY BYPASS GRAFTING (CABG) x1 USING LEFT INTERNAL MAMMARY ARTERY (LIMA);  Surgeon: Loreli Slot, MD;  Location: [facility] OR;  Service:  Open Heart Surgery;  Laterality: N/A;   CORONARY/GRAFT ACUTE MI REVASCULARIZATION N/A 07/13/2017   Procedure: Coronary/Graft Acute MI Revascularization;  Surgeon: Yates Decamp, MD;  Location: [facility] INVASIVE CV LAB;  Service: Cardiovascular;  Laterality: N/A;   ESOPHAGOGASTRODUODENOSCOPY N/A 06/30/2020   Procedure: ESOPHAGOGASTRODUODENOSCOPY (EGD);  Surgeon: Toledo, Boykin Nearing, MD;  Location: [facility] ENDOSCOPY;  Service: Gastroenterology;   Laterality: N/A;   LEFT HEART CATH AND CORONARY ANGIOGRAPHY N/A 07/13/2017   Procedure: Left Heart Cath and Coronary Angiography;  Surgeon: Yates Decamp, MD;  Location: [facility] INVASIVE CV LAB;  Service: Cardiovascular;  Laterality: N/A;   LEFT HEART CATH AND CORONARY ANGIOGRAPHY N/A 08/21/2017   Procedure: LEFT HEART CATH AND CORONARY ANGIOGRAPHY;  Surgeon: Elder Negus, MD;  Location: [facility] INVASIVE CV LAB;  Service: Cardiovascular;  Laterality: N/A;   LEFT HEART CATH AND CORONARY ANGIOGRAPHY N/A 08/10/2020   Procedure: LEFT HEART CATH AND CORONARY ANGIOGRAPHY;  Surgeon: Elder Negus, MD;  Location: [facility] INVASIVE CV LAB;  Service: Cardiovascular;  Laterality: N/A;   RIGHT/LEFT HEART CATH AND CORONARY ANGIOGRAPHY N/A 06/22/2023   Procedure: RIGHT/LEFT HEART CATH AND CORONARY ANGIOGRAPHY;  Surgeon: Yvonne Kendall, MD;  Location: [facility] INVASIVE CV LAB;  Service: Cardiovascular;  Laterality: N/A;   SKIN GRAFT     TEE WITHOUT CARDIOVERSION N/A 06/25/2023   Procedure: TRANSESOPHAGEAL ECHOCARDIOGRAM;  Surgeon: Loreli Slot, MD;  Location: [facility] OR;  Service: Open Heart Surgery;  Laterality: N/A;   VENTRICULAR ASSIST DEVICE INSERTION N/A 07/13/2017   Procedure: Ventricular Assist Device Insertion;  Surgeon: Yates Decamp, MD;  Location: [facility] INVASIVE CV LAB;  Service: Cardiovascular;  Laterality: N/A;    Current Medications: No outpatient medications have been marked as taking for the 07/24/23 encounter (Appointment) with Fransico Michael,  H, PA-C.     Allergies:   Patient has no known allergies.   Social History   Socioeconomic History   Marital status: Married    Spouse name: Not on file   Number of children: 2   Years of education: Not on file   Highest education level: Not on file  Occupational History   Not on file  Tobacco Use   Smoking status: Former    Current packs/day: 0.00    Average packs/day: 0.5 packs/day for 20.0 years (10.0 ttl pk-yrs)    Types: Cigarettes    Start date:  07/13/1997    Quit date: 07/13/2017    Years since quitting: 6.0   Smokeless tobacco: Never   Tobacco comments:    quit 2012  Vaping Use   Vaping status: Never Used  Substance and Sexual Activity   Alcohol use: Yes    Comment: occ   Drug use: Not Currently   Sexual activity: Yes  Other Topics Concern   Not on file  Social History Narrative   Lives with fiance, working FT , disesel equip, right handed, 2 kids, college edu   Social Determinants of Health   Financial Resource Strain: Not on file  Food Insecurity: No Food Insecurity (06/22/2023)   Hunger Vital Sign    Worried About Running Out of Food in the Last Year: Never true    Ran Out of Food in the Last Year: Never true  Transportation Needs: No Transportation Needs (06/22/2023)   PRAPARE - Administrator, Civil Service (Medical): No    Lack of Transportation (Non-Medical): No  Physical Activity: Sufficiently Active (05/23/2023)   Exercise Vital Sign    Days of Exercise per Week: 3 days  Minutes of Exercise per Session: 60 min  Stress: Stress Concern Present (05/23/2023)   [employer] of Occupational Health - Occupational Stress Questionnaire    Feeling of Stress : To some extent  Social Connections: Not on file     Family History: The patient's ***family history includes Heart disease in his paternal grandmother; Heart disease (age of onset: 22) in his father; Hypertension in his father; Valvular heart disease in his mother.  ROS:   Please see the history of present illness.    *** All other systems reviewed and are negative.  EKGs/Labs/Other Studies Reviewed:    The following studies were reviewed today: ***  EKG:  EKG is *** ordered today.  The ekg ordered today demonstrates ***  Recent Labs: 06/15/2023: ALT 15; B Natriuretic Peptide 183.4; TSH 4.699 06/26/2023: Magnesium 2.1 06/29/2023: BUN 18; Creatinine, Ser 1.34; Hemoglobin 11.3; Platelets 131; Potassium 3.7; Sodium 137  Recent Lipid Panel     Component Value Date/Time   CHOL 71 06/13/2023 0956   CHOL 165 03/16/2020 0732   TRIG 59.0 06/13/2023 0956   HDL 40.90 06/13/2023 0956   HDL 39 (L) 03/16/2020 0732   CHOLHDL 2 06/13/2023 0956   VLDL 11.8 06/13/2023 0956   LDLCALC 18 06/13/2023 0956   LDLCALC 74 03/16/2020 0732     Risk Assessment/Calculations:   {Does this patient have ATRIAL FIBRILLATION?:[phone]}   Physical Exam:    VS:  There were no vitals taken for this visit.    Wt Readings from Last 3 Encounters:  07/23/23 218 lb (98.9 kg)  07/20/23 218 lb (98.9 kg)  06/29/23 223 lb 15.8 oz (101.6 kg)     GEN: *** Well nourished, well developed in no acute distress HEENT: Normal NECK: No JVD; No carotid bruits LYMPHATICS: No lymphadenopathy CARDIAC: ***RRR, no murmurs, rubs, gallops RESPIRATORY:  Clear to auscultation without rales, wheezing or rhonchi  ABDOMEN: Soft, non-tender, non-distended MUSCULOSKELETAL:  No edema; No deformity  SKIN: Warm and dry NEUROLOGIC:  Alert and oriented x 3 PSYCHIATRIC:  Normal affect   ASSESSMENT:    No diagnosis found. PLAN:    In order of problems listed above:  ***  Disposition: Follow up {follow up:15908} with ***   Shared Decision Making/Informed Consent   {Are you ordering a CV Procedure (e.g. stress test, cath, DCCV, TEE, etc)?   Press F2        :[id number]}    Signed,  David Stall, PA-C  07/24/2023 7:50 AM    [facility]

## 2023-07-25 ENCOUNTER — Other Ambulatory Visit: Payer: Self-pay | Admitting: Cardiology

## 2023-07-25 ENCOUNTER — Other Ambulatory Visit: Payer: Self-pay

## 2023-07-25 ENCOUNTER — Other Ambulatory Visit (HOSPITAL_COMMUNITY): Payer: Self-pay

## 2023-07-26 ENCOUNTER — Other Ambulatory Visit: Payer: Self-pay

## 2023-07-26 MED ORDER — EZETIMIBE 10 MG PO TABS
10.0000 mg | ORAL_TABLET | Freq: Every day | ORAL | 0 refills | Status: DC
  Filled 2023-07-26: qty 90, 90d supply, fill #0

## 2023-07-30 ENCOUNTER — Other Ambulatory Visit: Payer: Self-pay | Admitting: Thoracic Surgery (Cardiothoracic Vascular Surgery)

## 2023-07-30 DIAGNOSIS — Z951 Presence of aortocoronary bypass graft: Secondary | ICD-10-CM

## 2023-07-31 ENCOUNTER — Ambulatory Visit
Admission: RE | Admit: 2023-07-31 | Discharge: 2023-07-31 | Disposition: A | Discharge status: Home / Self Care | Payer: BC Managed Care – PPO | Source: Ambulatory Visit | Attending: Thoracic Surgery (Cardiothoracic Vascular Surgery) | Admitting: Thoracic Surgery (Cardiothoracic Vascular Surgery)

## 2023-07-31 ENCOUNTER — Ambulatory Visit (INDEPENDENT_AMBULATORY_CARE_PROVIDER_SITE_OTHER): Payer: BC Managed Care – PPO | Admitting: Thoracic Surgery (Cardiothoracic Vascular Surgery)

## 2023-07-31 ENCOUNTER — Encounter: Payer: Self-pay | Admitting: Thoracic Surgery (Cardiothoracic Vascular Surgery)

## 2023-07-31 VITALS — BP 128/81 | HR 77 | Resp 20 | Ht 73.0 in | Wt 221.6 lb

## 2023-07-31 DIAGNOSIS — Z951 Presence of aortocoronary bypass graft: Secondary | ICD-10-CM | POA: Diagnosis not present

## 2023-07-31 NOTE — Progress Notes (Signed)
[address]       [place] [address]             [phone]     HPI: Mr. Ozment returns for a scheduled follow-up visit after coronary bypass grafting  Bobby [name] is a 52 year old man with a history of hypertension, hyperlipidemia, paroxysmal atrial fibrillation, CAD, HFrEF, and hypothyroidism.  He presented back in June with chest pain.  He was found to have an ostial 90% circumflex stenosis and chronic total occlusion of his right coronary.  He underwent coronary bypass grafting x 1 with a LIMA to OM1 on 06/25/2023.  I saw him back in the office on 07/23/2023.  He had an area of the incision that had opened up.  It was superficial dehiscence.  We debrided that and he has been doing normal saline wet-to-dry dressing changes.  No recurrent angina.  He was feeling poorly last week and thought he might have COVID.  He was not able to do Paxlovid because of potential interactions with amiodarone and Eliquis.  He recovered from that.  He did miss his appointment with Dr. Mariah Milling and that has been rescheduled.  Past Medical History:  Diagnosis Date   Acute MI anterior wall first episode care (HCC) 07/13/2017   Occluded ostial LAD S/P 3.5x26 Onyx DES. RI 80%. RCA Occluded at prior stent site with collaterals    Cardiogenic shock (HCC) 07/13/2017   CHF (congestive heart failure) (HCC)    Graves disease    Hypertension    MI (myocardial infarction) (HCC)    Thyroid disease    thyroid irradiated per pt    Current Outpatient Medications  Medication Sig Dispense Refill   amiodarone (PACERONE) 200 MG tablet Take 1 tablet (200 mg total) by mouth 2 (two) times daily for 10 days, THEN 1 tablet (200 mg total) daily thereafter. 60 tablet 1   apixaban (ELIQUIS) 5 MG TABS tablet Take 1 tablet (5 mg total) by mouth 2 (two) times daily. Await additional instructions once at [facility] 60 tablet 0   aspirin 81 MG chewable tablet Chew 1 tablet (81 mg total) by mouth daily. 30 tablet 11    atorvastatin (LIPITOR) 20 MG tablet Take 1 tablet (20 mg total) by mouth daily. 90 tablet 0   carvedilol (COREG) 3.125 MG tablet Take 1 tablet (3.125 mg total) by mouth 2 (two) times daily. 180 tablet 3   dapagliflozin propanediol (FARXIGA) 10 MG TABS tablet Take 1 tablet (10 mg total) by mouth daily before breakfast. 90 tablet 2   ezetimibe (ZETIA) 10 MG tablet Take 1 tablet (10 mg total) by mouth daily. 90 tablet 0   levothyroxine (SYNTHROID) 137 MCG tablet Take 1 tablet (137 mcg total) by mouth daily. 90 tablet 0   pantoprazole (PROTONIX) 40 MG tablet Take 1 tablet (40 mg total) by mouth 2 (two) times daily. 60 tablet 3   sildenafil (VIAGRA) 100 MG tablet Take 1 tablet (100 mg total) by mouth as needed for erectile dysfunction. TAKE 1 (ONE) TABLET AS NEEDED. DO NOT USE AT SAME TIME AS NITRO 30 tablet 3   No current facility-administered medications for this visit.    Physical Exam BP 128/81 (BP Location: Left Arm, Patient Position: Sitting, Cuff Size: Normal)   Pulse 77   Resp 20   Ht 6\' 1"  (1.854 m)   Wt 221 lb 9.6 oz (100.5 kg)   SpO2 96% Comment: RA  BMI 29.52 kg/m  52 year old man  in no acute distress Alert and oriented x 3 with no focal deficits Lungs clear with equal breath sounds bilaterally Sternum stable, incision with a 3 x 0.8 x 0.5 cm open area, granulating over 100% of surface Cardiac regular rate and rhythm  Diagnostic Tests: I personally reviewed his chest x-ray images.  Normal postoperative appearance with no effusions or infiltrates.  Impression: Adaiah Searl is a 52 year old man with a history of hypertension, hyperlipidemia, paroxysmal atrial fibrillation, CAD, HFrEF, and hypothyroidism.  He presented back in June with chest pain.    Two-vessel CAD-  ostial 90% circumflex stenosis and chronic total occlusion of his right coronary.  He underwent coronary bypass grafting x 1 with a LIMA to OM1 on 06/25/2023.  No recurrent angina since surgery.  Status post  CABG-recovering well.  Still gets fatigued more easily than normal.  He is getting to the point now where that should start to improve pretty significantly over the next 3 to 4 weeks.  He is driving.  Still should not lift anything over 10 pounds for another 2 weeks.  Superficial sternal wound dehiscence-being treated with normal saline wet-to-dry dressing changes twice daily.  Wound looks beautiful and is granulating over 100% of the surface area.  No exudate.  Continue with wet-to-dry dressing changes  Plan: Follow-up with Dr. Mariah Milling Continue normal saline wet-to-dry dressing changes twice daily Return in 1 month to check on progress Call if any issues with sternal wound in the meantime  Loreli Slot, MD [facility] [phone]

## 2023-08-02 ENCOUNTER — Other Ambulatory Visit: Payer: Self-pay | Admitting: Medical

## 2023-08-02 DIAGNOSIS — I480 Paroxysmal atrial fibrillation: Secondary | ICD-10-CM

## 2023-08-03 ENCOUNTER — Other Ambulatory Visit: Payer: Self-pay

## 2023-08-03 MED ORDER — APIXABAN 5 MG PO TABS
5.0000 mg | ORAL_TABLET | Freq: Two times a day (BID) | ORAL | 5 refills | Status: DC
Start: 2023-08-03 — End: 2023-09-03
  Filled 2023-08-03 – 2023-08-23 (×2): qty 60, 30d supply, fill #0

## 2023-08-03 NOTE — Telephone Encounter (Signed)
Prescription refill request for Eliquis received. Indication: Afib  Last office visit: 06/18/23 Bobby [name])  Scr: 1.34 (06/29/23)  Age: 52 Weight: 100.5kg  Appropriate dose. Refill sent.

## 2023-08-05 DIAGNOSIS — J069 Acute upper respiratory infection, unspecified: Secondary | ICD-10-CM | POA: Insufficient documentation

## 2023-08-05 NOTE — Assessment & Plan Note (Addendum)
Symptomatic treatment discussed. Benzonatate Perles and cough syrup for bedtime. Adequate hydration and rest.

## 2023-08-06 ENCOUNTER — Other Ambulatory Visit: Payer: Self-pay

## 2023-08-06 MED FILL — Dapagliflozin Propanediol Tab 10 MG (Base Equivalent): ORAL | 90 days supply | Qty: 90 | Fill #5 | Status: CN

## 2023-08-07 ENCOUNTER — Encounter: Payer: 59 | Attending: Thoracic Surgery (Cardiothoracic Vascular Surgery) | Admitting: *Deleted

## 2023-08-07 DIAGNOSIS — Z951 Presence of aortocoronary bypass graft: Secondary | ICD-10-CM | POA: Insufficient documentation

## 2023-08-07 DIAGNOSIS — Z48812 Encounter for surgical aftercare following surgery on the circulatory system: Secondary | ICD-10-CM | POA: Insufficient documentation

## 2023-08-07 DIAGNOSIS — Z87891 Personal history of nicotine dependence: Secondary | ICD-10-CM | POA: Insufficient documentation

## 2023-08-07 NOTE — Progress Notes (Signed)
Initial phone call completed. Diagnosis can be found in [facility] 6/28. EP Orientation scheduled for Wednesday 8/28 at 2:30.

## 2023-08-08 ENCOUNTER — Other Ambulatory Visit: Payer: Self-pay

## 2023-08-08 MED FILL — Dapagliflozin Propanediol Tab 10 MG (Base Equivalent): ORAL | 30 days supply | Qty: 30 | Fill #5 | Status: AC

## 2023-08-08 NOTE — Telephone Encounter (Signed)
done

## 2023-08-16 ENCOUNTER — Ambulatory Visit: Payer: BC Managed Care – PPO

## 2023-08-22 ENCOUNTER — Encounter: Payer: 59 | Admitting: *Deleted

## 2023-08-22 VITALS — Ht 73.0 in | Wt 221.8 lb

## 2023-08-22 DIAGNOSIS — Z951 Presence of aortocoronary bypass graft: Secondary | ICD-10-CM | POA: Diagnosis not present

## 2023-08-22 DIAGNOSIS — Z87891 Personal history of nicotine dependence: Secondary | ICD-10-CM | POA: Diagnosis not present

## 2023-08-22 DIAGNOSIS — Z48812 Encounter for surgical aftercare following surgery on the circulatory system: Secondary | ICD-10-CM | POA: Diagnosis not present

## 2023-08-22 NOTE — Patient Instructions (Signed)
Patient Instructions  Patient Details  Name: Bobby [name] MRN: [id number] Date of Birth: 1971/02/02 Referring Provider:  Loreli Slot, *  Below are your personal goals for exercise, nutrition, and risk factors. Our goal is to help you stay on track towards obtaining and maintaining these goals. We will be discussing your progress on these goals with you throughout the program.  Initial Exercise Prescription:  Initial Exercise Prescription - 08/22/23 1500       Date of Initial Exercise RX and Referring Provider   Date 08/22/23    Referring Provider Gollan      Oxygen   Maintain Oxygen Saturation 88% or higher      Treadmill   MPH 3    Grade 1    Minutes 15    METs 3.71      NuStep   Level 2   T6   SPM 80    Minutes 15    METs 3.76      Elliptical   Level 1    Speed 3    Minutes 15    METs 3.76      Prescription Details   Frequency (times per week) 3    Duration Progress to 30 minutes of continuous aerobic without signs/symptoms of physical distress      Intensity   THRR 40-80% of Max Heartrate 111-148    Ratings of Perceived Exertion 11-13    Perceived Dyspnea 0-4      Progression   Progression Continue to progress workloads to maintain intensity without signs/symptoms of physical distress.      Resistance Training   Training Prescription Yes    Weight 5    Reps 10-15             Exercise Goals: Frequency: Be able to perform aerobic exercise two to three times per week in program working toward 2-5 days per week of home exercise.  Intensity: Work with a perceived exertion of 11 (fairly light) - 15 (hard) while following your exercise prescription.  We will make changes to your prescription with you as you progress through the program.   Duration: Be able to do 30 to 45 minutes of continuous aerobic exercise in addition to a 5 minute warm-up and a 5 minute cool-down routine.   Nutrition Goals: Your personal nutrition goals will be established  when you do your nutrition analysis with the dietician.  The following are general nutrition guidelines to follow: Cholesterol < 200mg /day Sodium < 1500mg /day Fiber: Men over 50 yrs - 30 grams per day  Personal Goals:  Personal Goals and Risk Factors at Admission - 08/22/23 1607       Core Components/Risk Factors/Patient Goals on Admission    Weight Management Yes;Weight Loss    Intervention Weight Management: Develop a combined nutrition and exercise program designed to reach desired caloric intake, while maintaining appropriate intake of nutrient and fiber, sodium and fats, and appropriate energy expenditure required for the weight goal.;Weight Management: Provide education and appropriate resources to help participant work on and attain dietary goals.;Weight Management/Obesity: Establish reasonable short term and long term weight goals.;Obesity: Provide education and appropriate resources to help participant work on and attain dietary goals.    Admit Weight 221 lb 12.8 oz (100.6 kg)    Goal Weight: Short Term 215 lb (97.5 kg)    Goal Weight: Long Term 210 lb (95.3 kg)    Expected Outcomes Short Term: Continue to assess and modify interventions until short term weight is achieved;Long  Term: Adherence to nutrition and physical activity/exercise program aimed toward attainment of established weight goal;Weight Loss: Understanding of general recommendations for a balanced deficit meal plan, which promotes 1-2 lb weight loss per week and includes a negative energy balance of [phone] kcal/d;Understanding recommendations for meals to include 15-35% energy as protein, 25-35% energy from fat, 35-60% energy from carbohydrates, less than 200mg  of dietary cholesterol, 20-35 gm of total fiber daily;Understanding of distribution of calorie intake throughout the day with the consumption of 4-5 meals/snacks    Hypertension Yes    Intervention Provide education on lifestyle modifcations including regular  physical activity/exercise, weight management, moderate sodium restriction and increased consumption of fresh fruit, vegetables, and low fat dairy, alcohol moderation, and smoking cessation.;Monitor prescription use compliance.    Expected Outcomes Short Term: Continued assessment and intervention until BP is < 140/5mm HG in hypertensive participants. < 130/55mm HG in hypertensive participants with diabetes, heart failure or chronic kidney disease.;Long Term: Maintenance of blood pressure at goal levels.    Lipids Yes    Intervention Provide education and support for participant on nutrition & aerobic/resistive exercise along with prescribed medications to achieve LDL 70mg , HDL >40mg .    Expected Outcomes Short Term: Participant states understanding of desired cholesterol values and is compliant with medications prescribed. Participant is following exercise prescription and nutrition guidelines.;Long Term: Cholesterol controlled with medications as prescribed, with individualized exercise RX and with personalized nutrition plan. Value goals: LDL < 70mg , HDL > 40 mg.             Tobacco Use Initial Evaluation: Social History   Tobacco Use  Smoking Status Former   Current packs/day: 0.00   Average packs/day: 0.5 packs/day for 20.0 years (10.0 ttl pk-yrs)   Types: Cigarettes   Start date: 07/13/1997   Quit date: 07/13/2017   Years since quitting: 6.1  Smokeless Tobacco Never  Tobacco Comments   quit 2012    Exercise Goals and Review:  Exercise Goals     Row Name 08/22/23 1602             Exercise Goals   Increase Physical Activity Yes       Intervention Provide advice, education, support and counseling about physical activity/exercise needs.;Develop an individualized exercise prescription for aerobic and resistive training based on initial evaluation findings, risk stratification, comorbidities and participant's personal goals.       Expected Outcomes Short Term: Attend rehab on  a regular basis to increase amount of physical activity.;Long Term: Add in home exercise to make exercise part of routine and to increase amount of physical activity.;Long Term: Exercising regularly at least 3-5 days a week.       Increase Strength and Stamina Yes       Intervention Provide advice, education, support and counseling about physical activity/exercise needs.;Develop an individualized exercise prescription for aerobic and resistive training based on initial evaluation findings, risk stratification, comorbidities and participant's personal goals.       Expected Outcomes Short Term: Increase workloads from initial exercise prescription for resistance, speed, and METs.;Short Term: Perform resistance training exercises routinely during rehab and add in resistance training at home;Long Term: Improve cardiorespiratory fitness, muscular endurance and strength as measured by increased METs and functional capacity ( )       Able to understand and use rate of perceived exertion (RPE) scale Yes       Intervention Provide education and explanation on how to use RPE scale       Expected Outcomes Short  Term: Able to use RPE daily in rehab to express subjective intensity level;Long Term:  Able to use RPE to guide intensity level when exercising independently       Able to understand and use Dyspnea scale Yes       Intervention Provide education and explanation on how to use Dyspnea scale       Expected Outcomes Short Term: Able to use Dyspnea scale daily in rehab to express subjective sense of shortness of breath during exertion;Long Term: Able to use Dyspnea scale to guide intensity level when exercising independently       Knowledge and understanding of Target Heart Rate Range (THRR) Yes       Intervention Provide education and explanation of THRR including how the numbers were predicted and where they are located for reference       Expected Outcomes Short Term: Able to state/look up THRR;Long Term:  Able to use THRR to govern intensity when exercising independently;Short Term: Able to use daily as guideline for intensity in rehab       Able to check pulse independently Yes       Intervention Provide education and demonstration on how to check pulse in carotid and radial arteries.;Review the importance of being able to check your own pulse for safety during independent exercise       Expected Outcomes Short Term: Able to explain why pulse checking is important during independent exercise;Long Term: Able to check pulse independently and accurately       Understanding of Exercise Prescription Yes       Intervention Provide education, explanation, and written materials on patient's individual exercise prescription       Expected Outcomes Short Term: Able to explain program exercise prescription;Long Term: Able to explain home exercise prescription to exercise independently                Copy of goals given to participant.

## 2023-08-22 NOTE — Progress Notes (Signed)
Cardiac Individual Treatment Plan  Patient Details  Name: Bobby [name] MRN: [id number] Date of Birth: October 06, 1971 Referring Provider:   Flowsheet Row Cardiac Rehab from 08/22/2023 in [facility] Cardiac and Pulmonary Rehab  Referring Provider Gollan       Initial Encounter Date:  Flowsheet Row Cardiac Rehab from 08/22/2023 in [facility] Cardiac and Pulmonary Rehab  Date 08/22/23       Visit Diagnosis: S/P CABG x 1  Patient's Home Medications on Admission:  Current Outpatient Medications:    amiodarone (PACERONE) 200 MG tablet, Take 1 tablet (200 mg total) by mouth 2 (two) times daily for 10 days, THEN 1 tablet (200 mg total) daily thereafter., Disp: 60 tablet, Rfl: 1   apixaban (ELIQUIS) 5 MG TABS tablet, Take 1 tablet (5 mg total) by mouth 2 (two) times daily., Disp: 60 tablet, Rfl: 5   aspirin 81 MG chewable tablet, Chew 1 tablet (81 mg total) by mouth daily., Disp: 30 tablet, Rfl: 11   atorvastatin (LIPITOR) 20 MG tablet, Take 1 tablet (20 mg total) by mouth daily., Disp: 90 tablet, Rfl: 0   carvedilol (COREG) 3.125 MG tablet, Take 1 tablet (3.125 mg total) by mouth 2 (two) times daily., Disp: 180 tablet, Rfl: 3   dapagliflozin propanediol (FARXIGA) 10 MG TABS tablet, Take 1 tablet (10 mg total) by mouth daily before breakfast., Disp: 90 tablet, Rfl: 2   ezetimibe (ZETIA) 10 MG tablet, Take 1 tablet (10 mg total) by mouth daily., Disp: 90 tablet, Rfl: 0   levothyroxine (SYNTHROID) 137 MCG tablet, Take 1 tablet (137 mcg total) by mouth daily., Disp: 90 tablet, Rfl: 0   pantoprazole (PROTONIX) 40 MG tablet, Take 1 tablet (40 mg total) by mouth 2 (two) times daily., Disp: 60 tablet, Rfl: 3   sildenafil (VIAGRA) 100 MG tablet, Take 1 tablet (100 mg total) by mouth as needed for erectile dysfunction. TAKE 1 (ONE) TABLET AS NEEDED. DO NOT USE AT SAME TIME AS NITRO, Disp: 30 tablet, Rfl: 3  Past Medical History: Past Medical History:  Diagnosis Date   Acute MI anterior wall first episode care (HCC)  07/13/2017   Occluded ostial LAD S/P 3.5x26 Onyx DES. RI 80%. RCA Occluded at prior stent site with collaterals    Cardiogenic shock (HCC) 07/13/2017   CHF (congestive heart failure) (HCC)    Graves disease    Hypertension    MI (myocardial infarction) (HCC)    Thyroid disease    thyroid irradiated per pt    Tobacco Use: Social History   Tobacco Use  Smoking Status Former   Current packs/day: 0.00   Average packs/day: 0.5 packs/day for 20.0 years (10.0 ttl pk-yrs)   Types: Cigarettes   Start date: 07/13/1997   Quit date: 07/13/2017   Years since quitting: 6.1  Smokeless Tobacco Never  Tobacco Comments   quit 2012    Labs: Review Flowsheet  More data exists      Latest Ref Rng & Units 07/26/2022 06/13/2023 06/22/2023 06/23/2023 06/25/2023  Labs for ITP Cardiac and Pulmonary Rehab  Cholestrol 0 - 200 mg/dL 74  71  - - -  LDL (calc) 0 - 99 mg/dL 24  18  - - -  HDL-C >08.65 mg/dL 39  78.46  - - -  Trlycerides 0.0 - 149.0 mg/dL 54  96.2  - - -  Hemoglobin A1c 4.8 - 5.6 % 6.3  6.5  - 6.4  -  PH, Arterial 7.35 - 7.45 - - 7.383  - 7.287  7.418  7.345  7.326  7.402  7.313   PCO2 arterial 32 - 48 mmHg - - 37.6  - 44.6  32.4  39.2  43.2  38.4  44.5   Bicarbonate 20.0 - 28.0 mmol/L - - 24.9  22.4  - 21.4  21.1  21.7  22.6  23.9  22.5   TCO2 22 - 32 mmol/L - - 26  24  - 23  22  23  23  24  26  25  25  24  29    Acid-base deficit 0.0 - 2.0 mmol/L - - 1.0  2.0  - 5.0  3.0  4.0  3.0  1.0  4.0   O2 Saturation % - - 71  99  - 99  98  96  97  100  100     Details       Multiple values from one day are sorted in reverse-chronological order          Exercise Target Goals: Exercise Program Goal: Individual exercise prescription set using results from initial 6 min walk test and THRR while considering  patient's activity barriers and safety.   Exercise Prescription Goal: Initial exercise prescription builds to 30-45 minutes a day of aerobic activity, 2-3 days per week.  Home exercise  guidelines will be given to patient during program as part of exercise prescription that the participant will acknowledge.   Education: Aerobic Exercise: - Group verbal and visual presentation on the components of exercise prescription. Introduces F.I.T.T principle from ACSM for exercise prescriptions.  Reviews F.I.T.T. principles of aerobic exercise including progression. Written material given at graduation.   Education: Resistance Exercise: - Group verbal and visual presentation on the components of exercise prescription. Introduces F.I.T.T principle from ACSM for exercise prescriptions  Reviews F.I.T.T. principles of resistance exercise including progression. Written material given at graduation.    Education: Exercise & Equipment Safety: - Individual verbal instruction and demonstration of equipment use and safety with use of the equipment. Flowsheet Row Cardiac Rehab from 08/22/2023 in [facility] Cardiac and Pulmonary Rehab  Date 08/22/23  Educator [facility]  Instruction Review Code 1- Verbalizes Understanding       Education: Exercise Physiology & General Exercise Guidelines: - Group verbal and written instruction with models to review the exercise physiology of the cardiovascular system and associated critical values. Provides general exercise guidelines with specific guidelines to those with heart or lung disease.  Flowsheet Row Cardiac Rehab from 11/12/2017 in [facility] Cardiac and Pulmonary Rehab  Date 10/22/17  Educator [facility]  Instruction Review Code 1- [employer] Understanding       Education: Flexibility, Balance, Mind/Body Relaxation: - Group verbal and visual presentation with interactive activity on the components of exercise prescription. Introduces F.I.T.T principle from ACSM for exercise prescriptions. Reviews F.I.T.T. principles of flexibility and balance exercise training including progression. Also discusses the mind body connection.  Reviews various relaxation techniques to help  reduce and manage stress (i.e. Deep breathing, progressive muscle relaxation, and visualization). Balance handout provided to take home. Written material given at graduation.   Activity Barriers & Risk Stratification:  Activity Barriers & Cardiac Risk Stratification - 08/22/23 1558       Activity Barriers & Cardiac Risk Stratification   Activity Barriers Back Problems;Incisional Pain    Cardiac Risk Stratification High             6 Minute Walk:  6 Minute Walk     Row Name 08/22/23 1555  6 Minute Walk   Phase Initial     Distance 1330 feet     Walk Time 6 minutes     # of Rest Breaks 0     MPH 2.52     METS 3.76     RPE 9     Perceived Dyspnea  0     VO2 Peak 13.14     Symptoms No     Resting HR 74 bpm     Resting BP 112/72     Resting Oxygen Saturation  96 %     Exercise Oxygen Saturation  during 6 min walk 98 %     Max Ex. HR 97 bpm     Max Ex. BP 122/70     2 Minute Post BP 104/62              Oxygen Initial Assessment:   Oxygen Re-Evaluation:   Oxygen Discharge (Final Oxygen Re-Evaluation):   Initial Exercise Prescription:  Initial Exercise Prescription - 08/22/23 1500       Date of Initial Exercise RX and Referring Provider   Date 08/22/23    Referring Provider Gollan      Oxygen   Maintain Oxygen Saturation 88% or higher      Treadmill   MPH 3    Grade 1    Minutes 15    METs 3.71      NuStep   Level 2   T6   SPM 80    Minutes 15    METs 3.76      Elliptical   Level 1    Speed 3    Minutes 15    METs 3.76      Prescription Details   Frequency (times per week) 3    Duration Progress to 30 minutes of continuous aerobic without signs/symptoms of physical distress      Intensity   THRR 40-80% of Max Heartrate 111-148    Ratings of Perceived Exertion 11-13    Perceived Dyspnea 0-4      Progression   Progression Continue to progress workloads to maintain intensity without signs/symptoms of physical distress.       Resistance Training   Training Prescription Yes    Weight 5    Reps 10-15             Perform Capillary Blood Glucose checks as needed.  Exercise Prescription Changes:   Exercise Prescription Changes     Row Name 08/22/23 1500             Response to Exercise   Blood Pressure (Admit) 112/72       Blood Pressure (Exercise) 122/70       Blood Pressure (Exit) 104/62       Heart Rate (Admit) 74 bpm       Heart Rate (Exercise) 97 bpm       Heart Rate (Exit) 73 bpm       Oxygen Saturation (Admit) 96 %       Oxygen Saturation (Exercise) 98 %       Oxygen Saturation (Exit) 98 %       Rating of Perceived Exertion (Exercise) 9       Perceived Dyspnea (Exercise) 0       Symptoms none       Comments 6 MWT results                Exercise Comments:   Exercise Goals and Review:   Exercise  Goals     Row Name 08/22/23 1602             Exercise Goals   Increase Physical Activity Yes       Intervention Provide advice, education, support and counseling about physical activity/exercise needs.;Develop an individualized exercise prescription for aerobic and resistive training based on initial evaluation findings, risk stratification, comorbidities and participant's personal goals.       Expected Outcomes Short Term: Attend rehab on a regular basis to increase amount of physical activity.;Long Term: Add in home exercise to make exercise part of routine and to increase amount of physical activity.;Long Term: Exercising regularly at least 3-5 days a week.       Increase Strength and Stamina Yes       Intervention Provide advice, education, support and counseling about physical activity/exercise needs.;Develop an individualized exercise prescription for aerobic and resistive training based on initial evaluation findings, risk stratification, comorbidities and participant's personal goals.       Expected Outcomes Short Term: Increase workloads from initial exercise prescription  for resistance, speed, and METs.;Short Term: Perform resistance training exercises routinely during rehab and add in resistance training at home;Long Term: Improve cardiorespiratory fitness, muscular endurance and strength as measured by increased METs and functional capacity ( )       Able to understand and use rate of perceived exertion (RPE) scale Yes       Intervention Provide education and explanation on how to use RPE scale       Expected Outcomes Short Term: Able to use RPE daily in rehab to express subjective intensity level;Long Term:  Able to use RPE to guide intensity level when exercising independently       Able to understand and use Dyspnea scale Yes       Intervention Provide education and explanation on how to use Dyspnea scale       Expected Outcomes Short Term: Able to use Dyspnea scale daily in rehab to express subjective sense of shortness of breath during exertion;Long Term: Able to use Dyspnea scale to guide intensity level when exercising independently       Knowledge and understanding of Target Heart Rate Range (THRR) Yes       Intervention Provide education and explanation of THRR including how the numbers were predicted and where they are located for reference       Expected Outcomes Short Term: Able to state/look up THRR;Long Term: Able to use THRR to govern intensity when exercising independently;Short Term: Able to use daily as guideline for intensity in rehab       Able to check pulse independently Yes       Intervention Provide education and demonstration on how to check pulse in carotid and radial arteries.;Review the importance of being able to check your own pulse for safety during independent exercise       Expected Outcomes Short Term: Able to explain why pulse checking is important during independent exercise;Long Term: Able to check pulse independently and accurately       Understanding of Exercise Prescription Yes       Intervention Provide education,  explanation, and written materials on patient's individual exercise prescription       Expected Outcomes Short Term: Able to explain program exercise prescription;Long Term: Able to explain home exercise prescription to exercise independently                Exercise Goals Re-Evaluation :   Discharge Exercise Prescription (Final Exercise Prescription  Changes):  Exercise Prescription Changes - 08/22/23 1500       Response to Exercise   Blood Pressure (Admit) 112/72    Blood Pressure (Exercise) 122/70    Blood Pressure (Exit) 104/62    Heart Rate (Admit) 74 bpm    Heart Rate (Exercise) 97 bpm    Heart Rate (Exit) 73 bpm    Oxygen Saturation (Admit) 96 %    Oxygen Saturation (Exercise) 98 %    Oxygen Saturation (Exit) 98 %    Rating of Perceived Exertion (Exercise) 9    Perceived Dyspnea (Exercise) 0    Symptoms none    Comments 6 MWT results             Nutrition:  Target Goals: Understanding of nutrition guidelines, daily intake of sodium 1500mg , cholesterol 200mg , calories 30% from fat and 7% or less from saturated fats, daily to have 5 or more servings of fruits and vegetables.  Education: All About Nutrition: -Group instruction provided by verbal, written material, interactive activities, discussions, models, and posters to present general guidelines for heart healthy nutrition including fat, fiber, MyPlate, the role of sodium in heart healthy nutrition, utilization of the nutrition label, and utilization of this knowledge for meal planning. Follow up email sent as well. Written material given at graduation. Flowsheet Row Cardiac Rehab from 11/12/2017 in [facility] Cardiac and Pulmonary Rehab  Date 10/08/17  Educator [facility]  Instruction Review Code 1- Verbalizes Understanding       Biometrics:  Pre Biometrics - 08/22/23 1606       Pre Biometrics   Height 6\' 1"  (1.854 m)    Weight 221 lb 12.8 oz (100.6 kg)    Waist Circumference 42 inches    Hip Circumference 41.5  inches    Waist to Hip Ratio 1.01 %    BMI (Calculated) 29.27    Single Leg Stand 30 seconds              Nutrition Therapy Plan and Nutrition Goals:  Nutrition Therapy & Goals - 08/22/23 1607       Intervention Plan   Intervention Prescribe, educate and counsel regarding individualized specific dietary modifications aiming towards targeted core components such as weight, hypertension, lipid management, diabetes, heart failure and other comorbidities.    Expected Outcomes Short Term Goal: Understand basic principles of dietary content, such as calories, fat, sodium, cholesterol and nutrients.;Short Term Goal: A plan has been developed with personal nutrition goals set during dietitian appointment.;Long Term Goal: Adherence to prescribed nutrition plan.             Nutrition Assessments:  MEDIFICTS Score Key: ?70 Need to make dietary changes  40-70 Heart Healthy Diet ? 40 Therapeutic Level Cholesterol Diet  Flowsheet Row Cardiac Rehab from 08/22/2023 in [facility] Cardiac and Pulmonary Rehab  Picture Your Plate Total Score on Admission 69      Picture Your Plate Scores: <40 Unhealthy dietary pattern with much room for improvement. 41-50 Dietary pattern unlikely to meet recommendations for good health and room for improvement. 51-60 More healthful dietary pattern, with some room for improvement.  >60 Healthy dietary pattern, although there may be some specific behaviors that could be improved.    Nutrition Goals Re-Evaluation:   Nutrition Goals Discharge (Final Nutrition Goals Re-Evaluation):   Psychosocial: Target Goals: Acknowledge presence or absence of significant depression and/or stress, maximize coping skills, provide positive support system. Participant is able to verbalize types and ability to use techniques and skills needed for reducing  stress and depression.   Education: Stress, Anxiety, and Depression - Group verbal and visual presentation to define topics  covered.  Reviews how body is impacted by stress, anxiety, and depression.  Also discusses healthy ways to reduce stress and to treat/manage anxiety and depression.  Written material given at graduation.   Education: Sleep Hygiene -Provides group verbal and written instruction about how sleep can affect your health.  Define sleep hygiene, discuss sleep cycles and impact of sleep habits. Review good sleep hygiene tips.    Initial Review & Psychosocial Screening:  Initial Psych Review & Screening - 08/07/23 1309       Initial Review   Current issues with Current Sleep Concerns      Family Dynamics   Good Support System? Yes   wife     Barriers   Psychosocial barriers to participate in program There are no identifiable barriers or psychosocial needs.;The patient should benefit from training in stress management and relaxation.      Screening Interventions   Expected Outcomes Short Term goal: Utilizing psychosocial counselor, staff and physician to assist with identification of specific Stressors or current issues interfering with healing process. Setting desired goal for each stressor or current issue identified.;Short Term goal: Identification and review with participant of any Quality of Life or Depression concerns found by scoring the questionnaire.;Long Term Goal: Stressors or current issues are controlled or eliminated.;Long Term goal: The participant improves quality of Life and PHQ9 Scores as seen by post scores and/or verbalization of changes             Quality of Life Scores:   Quality of Life - 08/22/23 1610       Quality of Life   Select Quality of Life      Quality of Life Scores   Health/Function Pre 16.27 %    Socioeconomic Pre 17.44 %    Psych/Spiritual Pre 16.93 %    Family Pre 17.3 %    GLOBAL Pre 16.81 %            Scores of 19 and below usually indicate a poorer quality of life in these areas.  A difference of  2-3 points is a clinically meaningful  difference.  A difference of 2-3 points in the total score of the Quality of Life Index has been associated with significant improvement in overall quality of life, self-image, physical symptoms, and general health in studies assessing change in quality of life.  PHQ-9: Review Flowsheet  More data may exist      08/22/2023 06/20/2023 05/23/2023 10/15/2017 09/10/2017  Depression screen PHQ 2/9  Decreased Interest 1 1 1 1 2   Down, Depressed, Hopeless 1 1 1  0 2  PHQ - 2 Score 2 2 2 1 4   Altered sleeping 2 1 2  0 2  Tired, decreased energy 1 1 3 1 3   Change in appetite 0 1 3 0 0  Feeling bad or failure about yourself  0 1 0 0 2  Trouble concentrating 2 1 3 1 2   Moving slowly or fidgety/restless 0 1 0 0 0  Suicidal thoughts 0 0 0 0 -- 1  PHQ-9 Score 7 8 13 3 14   Difficult doing work/chores Somewhat difficult Somewhat difficult Somewhat difficult - Somewhat difficult    Details       Multiple values from one day are sorted in reverse-chronological order        Interpretation of Total Score  Total Score Depression Severity:  1-4 =  Minimal depression, 5-9 = Mild depression, 10-14 = Moderate depression, 15-19 = Moderately severe depression, 20-27 = Severe depression   Psychosocial Evaluation and Intervention:  Psychosocial Evaluation - 08/07/23 1325       Psychosocial Evaluation & Interventions   Interventions Encouraged to exercise with the program and follow exercise prescription    Comments Mobeen is coming to 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 He did the program in 2018 after an MI. He states he doesn't feel stressed currenlty, just trying to be patient with the healing process. 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 His wife and dog are his main support system. He states his sleep has been horrible since surgery and sometimes he takes a Tylenol PM. He thinks it is slowly getting better, but he is up many times a night. His sternum incision  did open and require debridement, he is almost done doing wet to dry dressing. He wants to work on his stamina and strength. He was supposed to participate in a [employer] race, but is unable to this year. His goal is to now be able to participate in this next year.    Expected Outcomes Short: attend cardiac rehab for education and exercise. Long: develop and maintain positive self care    Continue Psychosocial Services  Follow up required by staff             Psychosocial Re-Evaluation:   Psychosocial Discharge (Final Psychosocial Re-Evaluation):   Vocational Rehabilitation: Provide vocational rehab assistance to qualifying candidates.   Vocational Rehab Evaluation & Intervention:  Vocational Rehab - 08/07/23 1308       Initial Vocational Rehab Evaluation & Intervention   Assessment shows need for Vocational Rehabilitation No             Education: Education Goals: Education classes will be provided on a variety of topics geared toward better understanding of heart health and risk factor modification. Participant will state understanding/return demonstration of topics presented as noted by education test scores.  Learning Barriers/Preferences:  Learning Barriers/Preferences - 08/07/23 1308       Learning Barriers/Preferences   Learning Barriers None    Learning Preferences Individual Instruction             General Cardiac Education Topics:  AED/CPR: - Group verbal and written instruction with the use of models to demonstrate the basic use of the AED with the basic ABC's of resuscitation.   Anatomy and Cardiac Procedures: - Group verbal and visual presentation and models provide information about basic cardiac anatomy and function. Reviews the testing methods done to diagnose heart disease and the outcomes of the test results. Describes the treatment choices: Medical Management, Angioplasty, or Coronary Bypass Surgery for treating various heart  conditions including Myocardial Infarction, Angina, Valve Disease, and Cardiac Arrhythmias.  Written material given at graduation. Flowsheet Row Cardiac Rehab from 08/22/2023 in [facility] Cardiac and Pulmonary Rehab  Education need identified 08/22/23       Medication Safety: - Group verbal and visual instruction to review commonly prescribed medications for heart and lung disease. Reviews the medication, class of the drug, and side effects. Includes the steps to properly store meds and maintain the prescription regimen.  Written material given at graduation.   Intimacy: - Group verbal instruction through game format to discuss how heart and lung disease can affect sexual intimacy. Written material given at graduation..   Know Your  Numbers and Heart Failure: - Group verbal and visual instruction to discuss disease risk factors for cardiac and pulmonary disease and treatment options.  Reviews associated critical values for Overweight/Obesity, Hypertension, Cholesterol, and Diabetes.  Discusses basics of heart failure: signs/symptoms and treatments.  Introduces Heart Failure Zone chart for action plan for heart failure.  Written material given at graduation. Flowsheet Row Cardiac Rehab from 11/12/2017 in [facility] Cardiac and Pulmonary Rehab  Date 11/05/17  Educator [facility]  Instruction Review Code 1- Verbalizes Understanding       Infection Prevention: - Provides verbal and written material to individual with discussion of infection control including proper hand washing and proper equipment cleaning during exercise session. Flowsheet Row Cardiac Rehab from 08/22/2023 in [facility] Cardiac and Pulmonary Rehab  Date 08/22/23  Educator [facility]  Instruction Review Code 1- Verbalizes Understanding       Falls Prevention: - Provides verbal and written material to individual with discussion of falls prevention and safety. Flowsheet Row Cardiac Rehab from 08/22/2023 in [facility] Cardiac and Pulmonary Rehab  Date 08/22/23   Educator [employer] [facility]  Instruction Review Code 1- Verbalizes Understanding       Other: -Provides group and verbal instruction on various topics (see comments) Flowsheet Row Cardiac Rehab from 11/12/2017 in [facility] Cardiac and Pulmonary Rehab  Date 10/31/17  [Risk Factors and Know Your Numbers]  Educator [facility]  Instruction Review Code 1- Verbalizes Understanding       Knowledge Questionnaire Score:  Knowledge Questionnaire Score - 08/22/23 1611       Knowledge Questionnaire Score   Pre Score 25/26             Core Components/Risk Factors/Patient Goals at Admission:  Personal Goals and Risk Factors at Admission - 08/22/23 1607       Core Components/Risk Factors/Patient Goals on Admission    Weight Management Yes;Weight Loss    Intervention Weight Management: Develop a combined nutrition and exercise program designed to reach desired caloric intake, while maintaining appropriate intake of nutrient and fiber, sodium and fats, and appropriate energy expenditure required for the weight goal.;Weight Management: Provide education and appropriate resources to help participant work on and attain dietary goals.;Weight Management/Obesity: Establish reasonable short term and long term weight goals.;Obesity: Provide education and appropriate resources to help participant work on and attain dietary goals.    Admit Weight 221 lb 12.8 oz (100.6 kg)    Goal Weight: Short Term 215 lb (97.5 kg)    Goal Weight: Long Term 210 lb (95.3 kg)    Expected Outcomes Short Term: Continue to assess and modify interventions until short term weight is achieved;Long Term: Adherence to nutrition and physical activity/exercise program aimed toward attainment of established weight goal;Weight Loss: Understanding of general recommendations for a balanced deficit meal plan, which promotes 1-2 lb weight loss per week and includes a negative energy balance of [phone] kcal/d;Understanding recommendations for meals to include  15-35% energy as protein, 25-35% energy from fat, 35-60% energy from carbohydrates, less than 200mg  of dietary cholesterol, 20-35 gm of total fiber daily;Understanding of distribution of calorie intake throughout the day with the consumption of 4-5 meals/snacks    Hypertension Yes    Intervention Provide education on lifestyle modifcations including regular physical activity/exercise, weight management, moderate sodium restriction and increased consumption of fresh fruit, vegetables, and low fat dairy, alcohol moderation, and smoking cessation.;Monitor prescription use compliance.    Expected Outcomes Short Term: Continued assessment and intervention until BP is < 140/42mm HG in hypertensive participants. < 130/62mm  HG in hypertensive participants with diabetes, heart failure or chronic kidney disease.;Long Term: Maintenance of blood pressure at goal levels.    Lipids Yes    Intervention Provide education and support for participant on nutrition & aerobic/resistive exercise along with prescribed medications to achieve LDL 70mg , HDL >40mg .    Expected Outcomes Short Term: Participant states understanding of desired cholesterol values and is compliant with medications prescribed. Participant is following exercise prescription and nutrition guidelines.;Long Term: Cholesterol controlled with medications as prescribed, with individualized exercise RX and with personalized nutrition plan. Value goals: LDL < 70mg , HDL > 40 mg.             Education:Diabetes - Individual verbal and written instruction to review signs/symptoms of diabetes, desired ranges of glucose level fasting, after meals and with exercise. Acknowledge that pre and post exercise glucose checks will be done for 3 sessions at entry of program.   Core Components/Risk Factors/Patient Goals Review:    Core Components/Risk Factors/Patient Goals at Discharge (Final Review):    ITP Comments:  ITP Comments     Row Name 08/07/23 1323  08/22/23 1555         ITP Comments Initial phone call completed. Diagnosis can be found in [facility] 6/28. EP Orientation scheduled for Wednesday 8/28 at 2:30. Completed and gym orientation. Initial ITP created and sent for review to Dr. Bethann Punches, Medical Director.               Comments: initial ITP

## 2023-08-23 ENCOUNTER — Other Ambulatory Visit: Payer: Self-pay

## 2023-08-23 MED FILL — Carvedilol Tab 3.125 MG: ORAL | 90 days supply | Qty: 180 | Fill #1 | Status: AC

## 2023-08-24 ENCOUNTER — Other Ambulatory Visit: Payer: Self-pay

## 2023-08-24 ENCOUNTER — Other Ambulatory Visit: Payer: Self-pay | Admitting: Cardiovascular Disease

## 2023-08-24 MED FILL — Evolocumab Subcutaneous Soln Auto-Injector 140 MG/ML: SUBCUTANEOUS | 28 days supply | Qty: 2 | Fill #0 | Status: AC

## 2023-08-28 ENCOUNTER — Other Ambulatory Visit: Payer: Self-pay

## 2023-08-28 ENCOUNTER — Emergency Department (HOSPITAL_COMMUNITY)
Admission: EM | Admit: 2023-08-28 | Discharge: 2023-08-28 | Disposition: A | Discharge status: Home / Self Care | Payer: 59 | Attending: Emergency Medicine | Admitting: Emergency Medicine

## 2023-08-28 ENCOUNTER — Emergency Department (HOSPITAL_COMMUNITY): Payer: 59

## 2023-08-28 DIAGNOSIS — J9811 Atelectasis: Secondary | ICD-10-CM | POA: Diagnosis not present

## 2023-08-28 DIAGNOSIS — Z7982 Long term (current) use of aspirin: Secondary | ICD-10-CM | POA: Insufficient documentation

## 2023-08-28 DIAGNOSIS — R064 Hyperventilation: Secondary | ICD-10-CM | POA: Diagnosis not present

## 2023-08-28 DIAGNOSIS — Z7901 Long term (current) use of anticoagulants: Secondary | ICD-10-CM | POA: Diagnosis not present

## 2023-08-28 DIAGNOSIS — J811 Chronic pulmonary edema: Secondary | ICD-10-CM | POA: Diagnosis not present

## 2023-08-28 DIAGNOSIS — Z951 Presence of aortocoronary bypass graft: Secondary | ICD-10-CM | POA: Insufficient documentation

## 2023-08-28 DIAGNOSIS — R7989 Other specified abnormal findings of blood chemistry: Secondary | ICD-10-CM | POA: Diagnosis not present

## 2023-08-28 DIAGNOSIS — R079 Chest pain, unspecified: Secondary | ICD-10-CM | POA: Diagnosis not present

## 2023-08-28 DIAGNOSIS — R0602 Shortness of breath: Secondary | ICD-10-CM | POA: Insufficient documentation

## 2023-08-28 LAB — BASIC METABOLIC PANEL
Anion gap: 12 (ref 5–15)
BUN: 14 mg/dL (ref 6–20)
CO2: 20 mmol/L — ABNORMAL LOW (ref 22–32)
Calcium: 8.9 mg/dL (ref 8.9–10.3)
Chloride: 108 mmol/L (ref 98–111)
Creatinine, Ser: 1.17 mg/dL (ref 0.61–1.24)
GFR, Estimated: >60 mL/min (ref >60)
Glucose, Bld: 117 mg/dL — ABNORMAL HIGH (ref 70–99)
Potassium: 4.4 mmol/L (ref 3.5–5.1)
Sodium: 140 mmol/L (ref 135–145)

## 2023-08-28 LAB — CBC
HCT: 41.1 % (ref 39.0–52.0)
Hemoglobin: 13.4 g/dL (ref 13.0–17.0)
MCH: 31 pg (ref 26.0–34.0)
MCHC: 32.6 g/dL (ref 30.0–36.0)
MCV: 95.1 fL (ref 80.0–100.0)
Platelets: 201 10*3/uL (ref 150–400)
RBC: 4.32 MIL/uL (ref 4.22–5.81)
RDW: 13.8 % (ref 11.5–15.5)
WBC: 8.8 10*3/uL (ref 4.0–10.5)
nRBC: 0 % (ref 0.0–0.2)

## 2023-08-28 LAB — TROPONIN I (HIGH SENSITIVITY)
Troponin I (High Sensitivity): 19 ng/L — ABNORMAL HIGH (ref <18)
Troponin I (High Sensitivity): 18 ng/L — ABNORMAL HIGH (ref <18)

## 2023-08-28 LAB — BRAIN NATRIURETIC PEPTIDE: B Natriuretic Peptide: 455.2 pg/mL — ABNORMAL HIGH (ref 0.0–100.0)

## 2023-08-28 MED ORDER — IOHEXOL 350 MG/ML SOLN
75.0000 mL | Freq: Once | INTRAVENOUS | Status: AC | PRN
  Administered 2023-08-28: 75 mL via INTRAVENOUS

## 2023-08-28 NOTE — ED Triage Notes (Signed)
Pt states he had had CABG July 1st, 2024; today states breathing became labored suddenly; took 2 nitroglycerin, which he states helped some; c/o dizziness and generalized tingling, L shoulder and elbow pain; still endorses some mild sob; denies chest discomfort; hx MI, CHF, thyroid disease, HTN

## 2023-08-28 NOTE — Discharge Instructions (Signed)
Your workup today was reassuring.  I recommend you follow-up with cardiology and your primary care doctor as scheduled.  If you develop any new or recurrent symptoms you should return to the ED.

## 2023-08-28 NOTE — ED Provider Notes (Signed)
[name] Provider Note   CSN: [id number] Arrival date & time: 08/28/23  4098     History  Chief Complaint  Patient presents with   Shortness of Breath    Bobby [name] is a 52 y.o. male.  HPI   52 year old male with past medical history of recent CABG on 06/25/2023 presents to the emergency department with concern for shortness of breath.  Patient states last night before going to bed and through the night he noticed some right-sided rib pain.  This improves overnight when he woke up this morning he was feeling fatigued and short of breath.  Denies any cough, hemoptysis, swelling of his lower extremities.  He is been following outpatient with cardiothoracic and cardiology.  Says that he took nitroglycerin prior to arrival which had mild improvement of symptoms.  Otherwise denies any fever.  Home Medications Prior to Admission medications   Medication Sig Start Date End Date Taking? Authorizing Provider  amiodarone (PACERONE) 200 MG tablet Take 1 tablet (200 mg total) by mouth 2 (two) times daily for 10 days, THEN 1 tablet (200 mg total) daily thereafter. 06/29/23 06/28/24  Ardelle Balls, PA-C  apixaban (ELIQUIS) 5 MG TABS tablet Take 1 tablet (5 mg total) by mouth 2 (two) times daily. 08/03/23   Antonieta Iba, MD  aspirin 81 MG chewable tablet Chew 1 tablet (81 mg total) by mouth daily. 04/11/21   Patwardhan, Anabel Bene, MD  atorvastatin (LIPITOR) 20 MG tablet Take 1 tablet (20 mg total) by mouth daily. 06/29/23 06/28/24  Barrett, Erin R, PA-C  carvedilol (COREG) 3.125 MG tablet Take 1 tablet (3.125 mg total) by mouth 2 (two) times daily. 05/29/23   Antonieta Iba, MD  dapagliflozin propanediol (FARXIGA) 10 MG TABS tablet Take 1 tablet (10 mg total) by mouth daily before breakfast. 03/01/23   Patwardhan, Manish J, MD  Evolocumab (REPATHA SURECLICK) 140 MG/ML SOAJ Inject 140mg  into the skin once every 14 days. 08/24/23   Antonieta Iba, MD   ezetimibe (ZETIA) 10 MG tablet Take 1 tablet (10 mg total) by mouth daily. 07/26/23 07/25/24  Patwardhan, Anabel Bene, MD  levothyroxine (SYNTHROID) 137 MCG tablet Take 1 tablet (137 mcg total) by mouth daily. 07/13/23   Dana Allan, MD  pantoprazole (PROTONIX) 40 MG tablet Take 1 tablet (40 mg total) by mouth 2 (two) times daily. 05/23/23   Dana Allan, MD  sildenafil (VIAGRA) 100 MG tablet Take 1 tablet (100 mg total) by mouth as needed for erectile dysfunction. TAKE 1 (ONE) TABLET AS NEEDED. DO NOT USE AT SAME TIME AS NITRO 05/01/22   Patwardhan, Manish J, MD  DULoxetine (CYMBALTA) 20 MG capsule Take 1 capsule (20 mg total) by mouth 2 times daily. 11/23/21 01/24/22        Allergies    Patient has no known allergies.    Review of Systems   Review of Systems  Constitutional:  Positive for fatigue. Negative for fever.  Respiratory:  Positive for shortness of breath.   Cardiovascular:  Negative for chest pain, palpitations and leg swelling.  Gastrointestinal:  Negative for abdominal pain, diarrhea and vomiting.  Genitourinary:  Negative for flank pain.  Musculoskeletal:  Negative for back pain.  Skin:  Negative for rash.  Neurological:  Negative for headaches.    Physical Exam Updated Vital Signs BP 104/73   Pulse 61   Temp 97.7 F (36.5 C) (Oral)   Resp 13   SpO2 95%  Physical  Exam Vitals and nursing note reviewed.  Constitutional:      General: He is not in acute distress.    Appearance: Normal appearance.  HENT:     Head: Normocephalic.     Mouth/Throat:     Mouth: Mucous membranes are moist.  Cardiovascular:     Rate and Rhythm: Normal rate.  Pulmonary:     Effort: Pulmonary effort is normal. No tachypnea or respiratory distress.     Breath sounds: Decreased breath sounds present.  Chest:     Chest wall: No tenderness.  Abdominal:     Palpations: Abdomen is soft.     Tenderness: There is no abdominal tenderness.  Musculoskeletal:     Right lower leg: No edema.     Left  lower leg: No edema.  Skin:    General: Skin is warm.  Neurological:     Mental Status: He is alert and oriented to person, place, and time. Mental status is at baseline.  Psychiatric:        Mood and Affect: Mood normal.     ED Results / Procedures / Treatments   Labs (all labs ordered are listed, but only abnormal results are displayed) Labs Reviewed  BASIC METABOLIC PANEL - Abnormal; Notable for the following components:      Result Value   CO2 20 (*)    Glucose, Bld 117 (*)    All other components within normal limits  TROPONIN I (HIGH SENSITIVITY) - Abnormal; Notable for the following components:   Troponin I (High Sensitivity) 19 (*)    All other components within normal limits  CBC  BRAIN NATRIURETIC PEPTIDE  TROPONIN I (HIGH SENSITIVITY)    EKG EKG Interpretation Date/Time:  Tuesday August 28 2023 09:58:13 EDT Ventricular Rate:  68 PR Interval:  176 QRS Duration:  94 QT Interval:  402 QTC Calculation: 427 R Axis:   19  Text Interpretation: Normal sinus rhythm ST & T wave abnormality, consider inferior ischemia Abnormal ECG When compared with ECG of 28-Aug-2023 09:57, PREVIOUS ECG IS PRESENT Similar to previous Confirmed by Coralee Pesa [id number] on 08/28/2023 11:19:59 AM  Radiology DG Chest 2 View  Result Date: 08/28/2023 CLINICAL DATA:  Chest pain EXAM: CHEST - 2 VIEW COMPARISON:  07/31/2023 x-ray and older FINDINGS: Sternal wires. Stable cardiopericardial silhouette. No pneumothorax, effusion or consolidation. There are some linear areas along both lung bases laterally and peripherally, possible Kerley B-lines. Subtle interstitial edema. IMPRESSION: Postop chest. The heart is nonenlarged but there is a question of some interstitial edema with some Kerley B-lines. Electronically Signed   By: Karen Kays M.D.   On: 08/28/2023 11:41    Procedures Procedures    Medications Ordered in ED Medications - No data to display  ED Course/ Medical Decision Making/  A&P                                 Medical Decision Making Amount and/or Complexity of Data Reviewed Labs: ordered. Radiology: ordered.  Risk Prescription drug management.   52 year old male presents emergency department with right-sided rib pain and shortness of breath.  He is status post CABG 2 months ago.  Follows with Dr. Dorris Fetch and Dr. Mariah Milling for cardiology.  No active chest/back pain.  Vital signs are stable on arrival.  EKG shows flattened ST segments which has been present before, no other acute ischemic changes for him.  Initial blood work is reassuring, first  troponin is 19 however significantly down from his previous troponins.  Repeat troponin is 18, flat.  No active chest pain.  Given the rib pain, shortness of breath and recent surgery plan for CT PE study to rule out. Then planned on speaking to cardiology given his recent surgical history.  Patient signed out to Dr. Earlene Plater pending these results.        Final Clinical Impression(s) / ED Diagnoses Final diagnoses:  None    Rx / DC Orders ED Discharge Orders     None         Rozelle Logan, DO 08/28/23 1527

## 2023-08-28 NOTE — ED Provider Notes (Signed)
Handoff received from Dr. Wilkie Aye.  Patient with recent CABG presenting for episode of shortness of breath which is since resolved as well as rib pain.  Handoff plan will plan to follow-up CTA, discussed with cardiology and likely discharge.   52 year old male history of recent CABG in July 1 of this year presents for shortness of breath.  He also had some right side of the rib pain.  EKG without significant change, troponin borderline elevated although stable and improved from prior.  I reviewed CTA chest, no signs of PE.  BNP is mildly elevated, no signs of pulmonary edema on CTA.  On my exam he is clinically euvolemic, resting comfortably.  He has no shortness of breath, chest pain, orthopnea.  No DOE.  I did discuss with [facility] cardiology on-call.  I reviewed his history, EKG, findings here.  They will schedule outpatient follow-up with him within a week, they think he is stable for outpatient management which I think is reasonable given he is now asymptomatic.  Cardiology recommends holding starting any diuretics for now given no pulmonary edema or clinical volume overload.  Patient is comfortable this plan as well.  I given strict return precautions for any worsening or recurrent symptoms.  He was discharged in stable condition.   Laurence Spates, MD 08/28/23 [phone]

## 2023-08-29 ENCOUNTER — Encounter: Payer: Self-pay | Admitting: *Deleted

## 2023-08-29 ENCOUNTER — Encounter: Payer: 59 | Attending: Thoracic Surgery (Cardiothoracic Vascular Surgery) | Admitting: *Deleted

## 2023-08-29 DIAGNOSIS — Z951 Presence of aortocoronary bypass graft: Secondary | ICD-10-CM

## 2023-08-29 NOTE — Progress Notes (Signed)
Daily Session Note  Patient Details  Name: Bobby [name] MRN: [id number] Date of Birth: 11/20/1971 Referring Provider:   Flowsheet Row Cardiac Rehab from 08/22/2023 in [facility] Cardiac and Pulmonary Rehab  Referring Provider Gollan       Encounter Date: 08/29/2023  Check In:  Session Check In - 08/29/23 1537       Check-In   Supervising physician immediately available to respond to emergencies See telemetry face sheet for immediately available ER MD    Location [facility]-Cardiac & Pulmonary Rehab    Staff Present Susann Givens, RN Mabeline Caras, BS, ACSM CEP, Exercise Physiologist;Joseph Reino [place], [place]    Virtual Visit No    Medication changes reported     No    Fall or balance concerns reported    No    Warm-up and Cool-down Performed on first and last piece of equipment    Resistance Training Performed Yes    VAD Patient? No    PAD/SET Patient? No      Pain Assessment   Currently in Pain? No/denies                Social History   Tobacco Use  Smoking Status Former   Current packs/day: 0.00   Average packs/day: 0.5 packs/day for 20.0 years (10.0 ttl pk-yrs)   Types: Cigarettes   Start date: 07/13/1997   Quit date: 07/13/2017   Years since quitting: 6.1  Smokeless Tobacco Never  Tobacco Comments   quit 2012    Goals Met:  Independence with exercise equipment Exercise tolerated well No report of concerns or symptoms today Strength training completed today  Goals Unmet:  Not Applicable  Comments:First full day of exercise!  Patient was oriented to gym and equipment including functions, settings, policies, and procedures.  Patient's individual exercise prescription and treatment plan were reviewed.  All starting workloads were established based on the results of the 6 minute walk test done at initial orientation visit.  The plan for exercise progression was also introduced and progression will be customized based on patient's performance and goals. '    Dr.  Bethann Punches is Medical Director for [facility] Cardiac Rehabilitation.  Dr. Vida Rigger is Medical Director for [facility] Pulmonary Rehabilitation.

## 2023-08-29 NOTE — Progress Notes (Signed)
Cardiac Individual Treatment Plan  Patient Details  Name: Bobby [name] MRN: [id number] Date of Birth: 1971/11/18 Referring Provider:   Flowsheet Row Cardiac Rehab from 08/22/2023 in [facility] Cardiac and Pulmonary Rehab  Referring Provider Gollan       Initial Encounter Date:  Flowsheet Row Cardiac Rehab from 08/22/2023 in [facility] Cardiac and Pulmonary Rehab  Date 08/22/23       Visit Diagnosis: S/P CABG x 1  Patient's Home Medications on Admission:  Current Outpatient Medications:    amiodarone (PACERONE) 200 MG tablet, Take 1 tablet (200 mg total) by mouth 2 (two) times daily for 10 days, THEN 1 tablet (200 mg total) daily thereafter., Disp: 60 tablet, Rfl: 1   apixaban (ELIQUIS) 5 MG TABS tablet, Take 1 tablet (5 mg total) by mouth 2 (two) times daily., Disp: 60 tablet, Rfl: 5   aspirin 81 MG chewable tablet, Chew 1 tablet (81 mg total) by mouth daily., Disp: 30 tablet, Rfl: 11   atorvastatin (LIPITOR) 20 MG tablet, Take 1 tablet (20 mg total) by mouth daily., Disp: 90 tablet, Rfl: 0   carvedilol (COREG) 3.125 MG tablet, Take 1 tablet (3.125 mg total) by mouth 2 (two) times daily., Disp: 180 tablet, Rfl: 3   dapagliflozin propanediol (FARXIGA) 10 MG TABS tablet, Take 1 tablet (10 mg total) by mouth daily before breakfast., Disp: 90 tablet, Rfl: 2   Evolocumab (REPATHA SURECLICK) 140 MG/ML SOAJ, Inject 140mg  into the skin once every 14 days., Disp: 2 mL, Rfl: 0   ezetimibe (ZETIA) 10 MG tablet, Take 1 tablet (10 mg total) by mouth daily., Disp: 90 tablet, Rfl: 0   levothyroxine (SYNTHROID) 137 MCG tablet, Take 1 tablet (137 mcg total) by mouth daily., Disp: 90 tablet, Rfl: 0   pantoprazole (PROTONIX) 40 MG tablet, Take 1 tablet (40 mg total) by mouth 2 (two) times daily., Disp: 60 tablet, Rfl: 3   sildenafil (VIAGRA) 100 MG tablet, Take 1 tablet (100 mg total) by mouth as needed for erectile dysfunction. TAKE 1 (ONE) TABLET AS NEEDED. DO NOT USE AT SAME TIME AS NITRO, Disp: 30 tablet, Rfl:  3  Past Medical History: Past Medical History:  Diagnosis Date   Acute MI anterior wall first episode care (HCC) 07/13/2017   Occluded ostial LAD S/P 3.5x26 Onyx DES. RI 80%. RCA Occluded at prior stent site with collaterals    Cardiogenic shock (HCC) 07/13/2017   CHF (congestive heart failure) (HCC)    Graves disease    Hypertension    MI (myocardial infarction) (HCC)    Thyroid disease    thyroid irradiated per pt    Tobacco Use: Social History   Tobacco Use  Smoking Status Former   Current packs/day: 0.00   Average packs/day: 0.5 packs/day for 20.0 years (10.0 ttl pk-yrs)   Types: Cigarettes   Start date: 07/13/1997   Quit date: 07/13/2017   Years since quitting: 6.1  Smokeless Tobacco Never  Tobacco Comments   quit 2012    Labs: Review Flowsheet  More data exists      Latest Ref Rng & Units 07/26/2022 06/13/2023 06/22/2023 06/23/2023 06/25/2023  Labs for ITP Cardiac and Pulmonary Rehab  Cholestrol 0 - 200 mg/dL 74  71  - - -  LDL (calc) 0 - 99 mg/dL 24  18  - - -  HDL-C >40.98 mg/dL 39  11.91  - - -  Trlycerides 0.0 - 149.0 mg/dL 54  47.8  - - -  Hemoglobin A1c 4.8 - 5.6 %  6.3  6.5  - 6.4  -  PH, Arterial 7.35 - 7.45 - - 7.383  - 7.287  7.418  7.345  7.326  7.402  7.313   PCO2 arterial 32 - 48 mmHg - - 37.6  - 44.6  32.4  39.2  43.2  38.4  44.5   Bicarbonate 20.0 - 28.0 mmol/L - - 24.9  22.4  - 21.4  21.1  21.7  22.6  23.9  22.5   TCO2 22 - 32 mmol/L - - 26  24  - 23  22  23  23  24  26  25  25  24  29    Acid-base deficit 0.0 - 2.0 mmol/L - - 1.0  2.0  - 5.0  3.0  4.0  3.0  1.0  4.0   O2 Saturation % - - 71  99  - 99  98  96  97  100  100     Details       Multiple values from one day are sorted in reverse-chronological order          Exercise Target Goals: Exercise Program Goal: Individual exercise prescription set using results from initial 6 min walk test and THRR while considering  patient's activity barriers and safety.   Exercise Prescription  Goal: Initial exercise prescription builds to 30-45 minutes a day of aerobic activity, 2-3 days per week.  Home exercise guidelines will be given to patient during program as part of exercise prescription that the participant will acknowledge.   Education: Aerobic Exercise: - Group verbal and visual presentation on the components of exercise prescription. Introduces F.I.T.T principle from ACSM for exercise prescriptions.  Reviews F.I.T.T. principles of aerobic exercise including progression. Written material given at graduation.   Education: Resistance Exercise: - Group verbal and visual presentation on the components of exercise prescription. Introduces F.I.T.T principle from ACSM for exercise prescriptions  Reviews F.I.T.T. principles of resistance exercise including progression. Written material given at graduation.    Education: Exercise & Equipment Safety: - Individual verbal instruction and demonstration of equipment use and safety with use of the equipment. Flowsheet Row Cardiac Rehab from 08/22/2023 in [facility] Cardiac and Pulmonary Rehab  Date 08/22/23  Educator [facility]  Instruction Review Code 1- Verbalizes Understanding       Education: Exercise Physiology & General Exercise Guidelines: - Group verbal and written instruction with models to review the exercise physiology of the cardiovascular system and associated critical values. Provides general exercise guidelines with specific guidelines to those with heart or lung disease.  Flowsheet Row Cardiac Rehab from 11/12/2017 in [facility] Cardiac and Pulmonary Rehab  Date 10/22/17  Educator [facility]  Instruction Review Code 1- [employer] Understanding       Education: Flexibility, Balance, Mind/Body Relaxation: - Group verbal and visual presentation with interactive activity on the components of exercise prescription. Introduces F.I.T.T principle from ACSM for exercise prescriptions. Reviews F.I.T.T. principles of flexibility and balance exercise  training including progression. Also discusses the mind body connection.  Reviews various relaxation techniques to help reduce and manage stress (i.e. Deep breathing, progressive muscle relaxation, and visualization). Balance handout provided to take home. Written material given at graduation.   Activity Barriers & Risk Stratification:  Activity Barriers & Cardiac Risk Stratification - 08/22/23 1558       Activity Barriers & Cardiac Risk Stratification   Activity Barriers Back Problems;Incisional Pain    Cardiac Risk Stratification High  6 Minute Walk:  6 Minute Walk     Row Name 08/22/23 1555         6 Minute Walk   Phase Initial     Distance 1330 feet     Walk Time 6 minutes     # of Rest Breaks 0     MPH 2.52     METS 3.76     RPE 9     Perceived Dyspnea  0     VO2 Peak 13.14     Symptoms No     Resting HR 74 bpm     Resting BP 112/72     Resting Oxygen Saturation  96 %     Exercise Oxygen Saturation  during 6 min walk 98 %     Max Ex. HR 97 bpm     Max Ex. BP 122/70     2 Minute Post BP 104/62              Oxygen Initial Assessment:   Oxygen Re-Evaluation:   Oxygen Discharge (Final Oxygen Re-Evaluation):   Initial Exercise Prescription:  Initial Exercise Prescription - 08/22/23 1500       Date of Initial Exercise RX and Referring Provider   Date 08/22/23    Referring Provider Gollan      Oxygen   Maintain Oxygen Saturation 88% or higher      Treadmill   MPH 3    Grade 1    Minutes 15    METs 3.71      NuStep   Level 2   T6   SPM 80    Minutes 15    METs 3.76      Elliptical   Level 1    Speed 3    Minutes 15    METs 3.76      Prescription Details   Frequency (times per week) 3    Duration Progress to 30 minutes of continuous aerobic without signs/symptoms of physical distress      Intensity   THRR 40-80% of Max Heartrate 111-148    Ratings of Perceived Exertion 11-13    Perceived Dyspnea 0-4       Progression   Progression Continue to progress workloads to maintain intensity without signs/symptoms of physical distress.      Resistance Training   Training Prescription Yes    Weight 5    Reps 10-15             Perform Capillary Blood Glucose checks as needed.  Exercise Prescription Changes:   Exercise Prescription Changes     Row Name 08/22/23 1500             Response to Exercise   Blood Pressure (Admit) 112/72       Blood Pressure (Exercise) 122/70       Blood Pressure (Exit) 104/62       Heart Rate (Admit) 74 bpm       Heart Rate (Exercise) 97 bpm       Heart Rate (Exit) 73 bpm       Oxygen Saturation (Admit) 96 %       Oxygen Saturation (Exercise) 98 %       Oxygen Saturation (Exit) 98 %       Rating of Perceived Exertion (Exercise) 9       Perceived Dyspnea (Exercise) 0       Symptoms none       Comments 6 MWT results  Exercise Comments:   Exercise Goals and Review:   Exercise Goals     Row Name 08/22/23 1602             Exercise Goals   Increase Physical Activity Yes       Intervention Provide advice, education, support and counseling about physical activity/exercise needs.;Develop an individualized exercise prescription for aerobic and resistive training based on initial evaluation findings, risk stratification, comorbidities and participant's personal goals.       Expected Outcomes Short Term: Attend rehab on a regular basis to increase amount of physical activity.;Long Term: Add in home exercise to make exercise part of routine and to increase amount of physical activity.;Long Term: Exercising regularly at least 3-5 days a week.       Increase Strength and Stamina Yes       Intervention Provide advice, education, support and counseling about physical activity/exercise needs.;Develop an individualized exercise prescription for aerobic and resistive training based on initial evaluation findings, risk stratification,  comorbidities and participant's personal goals.       Expected Outcomes Short Term: Increase workloads from initial exercise prescription for resistance, speed, and METs.;Short Term: Perform resistance training exercises routinely during rehab and add in resistance training at home;Long Term: Improve cardiorespiratory fitness, muscular endurance and strength as measured by increased METs and functional capacity ( )       Able to understand and use rate of perceived exertion (RPE) scale Yes       Intervention Provide education and explanation on how to use RPE scale       Expected Outcomes Short Term: Able to use RPE daily in rehab to express subjective intensity level;Long Term:  Able to use RPE to guide intensity level when exercising independently       Able to understand and use Dyspnea scale Yes       Intervention Provide education and explanation on how to use Dyspnea scale       Expected Outcomes Short Term: Able to use Dyspnea scale daily in rehab to express subjective sense of shortness of breath during exertion;Long Term: Able to use Dyspnea scale to guide intensity level when exercising independently       Knowledge and understanding of Target Heart Rate Range (THRR) Yes       Intervention Provide education and explanation of THRR including how the numbers were predicted and where they are located for reference       Expected Outcomes Short Term: Able to state/look up THRR;Long Term: Able to use THRR to govern intensity when exercising independently;Short Term: Able to use daily as guideline for intensity in rehab       Able to check pulse independently Yes       Intervention Provide education and demonstration on how to check pulse in carotid and radial arteries.;Review the importance of being able to check your own pulse for safety during independent exercise       Expected Outcomes Short Term: Able to explain why pulse checking is important during independent exercise;Long Term: Able to  check pulse independently and accurately       Understanding of Exercise Prescription Yes       Intervention Provide education, explanation, and written materials on patient's individual exercise prescription       Expected Outcomes Short Term: Able to explain program exercise prescription;Long Term: Able to explain home exercise prescription to exercise independently                Exercise  Goals Re-Evaluation :   Discharge Exercise Prescription (Final Exercise Prescription Changes):  Exercise Prescription Changes - 08/22/23 1500       Response to Exercise   Blood Pressure (Admit) 112/72    Blood Pressure (Exercise) 122/70    Blood Pressure (Exit) 104/62    Heart Rate (Admit) 74 bpm    Heart Rate (Exercise) 97 bpm    Heart Rate (Exit) 73 bpm    Oxygen Saturation (Admit) 96 %    Oxygen Saturation (Exercise) 98 %    Oxygen Saturation (Exit) 98 %    Rating of Perceived Exertion (Exercise) 9    Perceived Dyspnea (Exercise) 0    Symptoms none    Comments 6 MWT results             Nutrition:  Target Goals: Understanding of nutrition guidelines, daily intake of sodium 1500mg , cholesterol 200mg , calories 30% from fat and 7% or less from saturated fats, daily to have 5 or more servings of fruits and vegetables.  Education: All About Nutrition: -Group instruction provided by verbal, written material, interactive activities, discussions, models, and posters to present general guidelines for heart healthy nutrition including fat, fiber, MyPlate, the role of sodium in heart healthy nutrition, utilization of the nutrition label, and utilization of this knowledge for meal planning. Follow up email sent as well. Written material given at graduation. Flowsheet Row Cardiac Rehab from 11/12/2017 in [facility] Cardiac and Pulmonary Rehab  Date 10/08/17  Educator [facility]  Instruction Review Code 1- Verbalizes Understanding       Biometrics:  Pre Biometrics - 08/22/23 1606       Pre  Biometrics   Height 6\' 1"  (1.854 m)    Weight 221 lb 12.8 oz (100.6 kg)    Waist Circumference 42 inches    Hip Circumference 41.5 inches    Waist to Hip Ratio 1.01 %    BMI (Calculated) 29.27    Single Leg Stand 30 seconds              Nutrition Therapy Plan and Nutrition Goals:  Nutrition Therapy & Goals - 08/22/23 1607       Intervention Plan   Intervention Prescribe, educate and counsel regarding individualized specific dietary modifications aiming towards targeted core components such as weight, hypertension, lipid management, diabetes, heart failure and other comorbidities.    Expected Outcomes Short Term Goal: Understand basic principles of dietary content, such as calories, fat, sodium, cholesterol and nutrients.;Short Term Goal: A plan has been developed with personal nutrition goals set during dietitian appointment.;Long Term Goal: Adherence to prescribed nutrition plan.             Nutrition Assessments:  MEDIFICTS Score Key: ?70 Need to make dietary changes  40-70 Heart Healthy Diet ? 40 Therapeutic Level Cholesterol Diet  Flowsheet Row Cardiac Rehab from 08/22/2023 in [facility] Cardiac and Pulmonary Rehab  Picture Your Plate Total Score on Admission 69      Picture Your Plate Scores: <56 Unhealthy dietary pattern with much room for improvement. 41-50 Dietary pattern unlikely to meet recommendations for good health and room for improvement. 51-60 More healthful dietary pattern, with some room for improvement.  >60 Healthy dietary pattern, although there may be some specific behaviors that could be improved.    Nutrition Goals Re-Evaluation:   Nutrition Goals Discharge (Final Nutrition Goals Re-Evaluation):   Psychosocial: Target Goals: Acknowledge presence or absence of significant depression and/or stress, maximize coping skills, provide positive support system. Participant is able to verbalize  types and ability to use techniques and skills needed for  reducing stress and depression.   Education: Stress, Anxiety, and Depression - Group verbal and visual presentation to define topics covered.  Reviews how body is impacted by stress, anxiety, and depression.  Also discusses healthy ways to reduce stress and to treat/manage anxiety and depression.  Written material given at graduation.   Education: Sleep Hygiene -Provides group verbal and written instruction about how sleep can affect your health.  Define sleep hygiene, discuss sleep cycles and impact of sleep habits. Review good sleep hygiene tips.    Initial Review & Psychosocial Screening:  Initial Psych Review & Screening - 08/07/23 1309       Initial Review   Current issues with Current Sleep Concerns      Family Dynamics   Good Support System? Yes   wife     Barriers   Psychosocial barriers to participate in program There are no identifiable barriers or psychosocial needs.;The patient should benefit from training in stress management and relaxation.      Screening Interventions   Expected Outcomes Short Term goal: Utilizing psychosocial counselor, staff and physician to assist with identification of specific Stressors or current issues interfering with healing process. Setting desired goal for each stressor or current issue identified.;Short Term goal: Identification and review with participant of any Quality of Life or Depression concerns found by scoring the questionnaire.;Long Term Goal: Stressors or current issues are controlled or eliminated.;Long Term goal: The participant improves quality of Life and PHQ9 Scores as seen by post scores and/or verbalization of changes             Quality of Life Scores:   Quality of Life - 08/22/23 1610       Quality of Life   Select Quality of Life      Quality of Life Scores   Health/Function Pre 16.27 %    Socioeconomic Pre 17.44 %    Psych/Spiritual Pre 16.93 %    Family Pre 17.3 %    GLOBAL Pre 16.81 %             Scores of 19 and below usually indicate a poorer quality of life in these areas.  A difference of  2-3 points is a clinically meaningful difference.  A difference of 2-3 points in the total score of the Quality of Life Index has been associated with significant improvement in overall quality of life, self-image, physical symptoms, and general health in studies assessing change in quality of life.  PHQ-9: Review Flowsheet  More data may exist      08/22/2023 06/20/2023 05/23/2023 10/15/2017 09/10/2017  Depression screen PHQ 2/9  Decreased Interest 1 1 1 1 2   Down, Depressed, Hopeless 1 1 1  0 2  PHQ - 2 Score 2 2 2 1 4   Altered sleeping 2 1 2  0 2  Tired, decreased energy 1 1 3 1 3   Change in appetite 0 1 3 0 0  Feeling bad or failure about yourself  0 1 0 0 2  Trouble concentrating 2 1 3 1 2   Moving slowly or fidgety/restless 0 1 0 0 0  Suicidal thoughts 0 0 0 0 -- 1  PHQ-9 Score 7 8 13 3 14   Difficult doing work/chores Somewhat difficult Somewhat difficult Somewhat difficult - Somewhat difficult    Details       Multiple values from one day are sorted in reverse-chronological order        Interpretation  of Total Score  Total Score Depression Severity:  1-4 = Minimal depression, 5-9 = Mild depression, 10-14 = Moderate depression, 15-19 = Moderately severe depression, 20-27 = Severe depression   Psychosocial Evaluation and Intervention:  Psychosocial Evaluation - 08/07/23 1325       Psychosocial Evaluation & Interventions   Interventions Encouraged to exercise with the program and follow exercise prescription    Comments Temitope is coming to 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 He did the program in 2018 after an MI. He states he doesn't feel stressed currenlty, just trying to be patient with the healing process. 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 His wife and dog are his main support system. He states his sleep has been horrible since  surgery and sometimes he takes a Tylenol PM. He thinks it is slowly getting better, but he is up many times a night. His sternum incision did open and require debridement, he is almost done doing wet to dry dressing. He wants to work on his stamina and strength. He was supposed to participate in a [employer] race, but is unable to this year. His goal is to now be able to participate in this next year.    Expected Outcomes Short: attend cardiac rehab for education and exercise. Long: develop and maintain positive self care    Continue Psychosocial Services  Follow up required by staff             Psychosocial Re-Evaluation:   Psychosocial Discharge (Final Psychosocial Re-Evaluation):   Vocational Rehabilitation: Provide vocational rehab assistance to qualifying candidates.   Vocational Rehab Evaluation & Intervention:  Vocational Rehab - 08/07/23 1308       Initial Vocational Rehab Evaluation & Intervention   Assessment shows need for Vocational Rehabilitation No             Education: Education Goals: Education classes will be provided on a variety of topics geared toward better understanding of heart health and risk factor modification. Participant will state understanding/return demonstration of topics presented as noted by education test scores.  Learning Barriers/Preferences:  Learning Barriers/Preferences - 08/07/23 1308       Learning Barriers/Preferences   Learning Barriers None    Learning Preferences Individual Instruction             General Cardiac Education Topics:  AED/CPR: - Group verbal and written instruction with the use of models to demonstrate the basic use of the AED with the basic ABC's of resuscitation.   Anatomy and Cardiac Procedures: - Group verbal and visual presentation and models provide information about basic cardiac anatomy and function. Reviews the testing methods done to diagnose heart disease and the outcomes of the  test results. Describes the treatment choices: Medical Management, Angioplasty, or Coronary Bypass Surgery for treating various heart conditions including Myocardial Infarction, Angina, Valve Disease, and Cardiac Arrhythmias.  Written material given at graduation. Flowsheet Row Cardiac Rehab from 08/22/2023 in [facility] Cardiac and Pulmonary Rehab  Education need identified 08/22/23       Medication Safety: - Group verbal and visual instruction to review commonly prescribed medications for heart and lung disease. Reviews the medication, class of the drug, and side effects. Includes the steps to properly store meds and maintain the prescription regimen.  Written material given at graduation.   Intimacy: - Group verbal instruction through game format to discuss how heart and lung disease can affect  sexual intimacy. Written material given at graduation..   Know Your Numbers and Heart Failure: - Group verbal and visual instruction to discuss disease risk factors for cardiac and pulmonary disease and treatment options.  Reviews associated critical values for Overweight/Obesity, Hypertension, Cholesterol, and Diabetes.  Discusses basics of heart failure: signs/symptoms and treatments.  Introduces Heart Failure Zone chart for action plan for heart failure.  Written material given at graduation. Flowsheet Row Cardiac Rehab from 11/12/2017 in [facility] Cardiac and Pulmonary Rehab  Date 11/05/17  Educator [facility]  Instruction Review Code 1- Verbalizes Understanding       Infection Prevention: - Provides verbal and written material to individual with discussion of infection control including proper hand washing and proper equipment cleaning during exercise session. Flowsheet Row Cardiac Rehab from 08/22/2023 in [facility] Cardiac and Pulmonary Rehab  Date 08/22/23  Educator [facility]  Instruction Review Code 1- Verbalizes Understanding       Falls Prevention: - Provides verbal and written material to individual with  discussion of falls prevention and safety. Flowsheet Row Cardiac Rehab from 08/22/2023 in [facility] Cardiac and Pulmonary Rehab  Date 08/22/23  Educator [facility]  Instruction Review Code 1- Verbalizes Understanding       Other: -Provides group and verbal instruction on various topics (see comments) Flowsheet Row Cardiac Rehab from 11/12/2017 in [facility] Cardiac and Pulmonary Rehab  Date 10/31/17  [Risk Factors and Know Your Numbers]  Educator [facility]  Instruction Review Code 1- Verbalizes Understanding       Knowledge Questionnaire Score:  Knowledge Questionnaire Score - 08/22/23 1611       Knowledge Questionnaire Score   Pre Score 25/26             Core Components/Risk Factors/Patient Goals at Admission:  Personal Goals and Risk Factors at Admission - 08/22/23 1607       Core Components/Risk Factors/Patient Goals on Admission    Weight Management Yes;Weight Loss    Intervention Weight Management: Develop a combined nutrition and exercise program designed to reach desired caloric intake, while maintaining appropriate intake of nutrient and fiber, sodium and fats, and appropriate energy expenditure required for the weight goal.;Weight Management: Provide education and appropriate resources to help participant work on and attain dietary goals.;Weight Management/Obesity: Establish reasonable short term and long term weight goals.;Obesity: Provide education and appropriate resources to help participant work on and attain dietary goals.    Admit Weight 221 lb 12.8 oz (100.6 kg)    Goal Weight: Short Term 215 lb (97.5 kg)    Goal Weight: Long Term 210 lb (95.3 kg)    Expected Outcomes Short Term: Continue to assess and modify interventions until short term weight is achieved;Long Term: Adherence to nutrition and physical activity/exercise program aimed toward attainment of established weight goal;Weight Loss: Understanding of general recommendations for a balanced deficit meal plan, which promotes 1-2  lb weight loss per week and includes a negative energy balance of [phone] kcal/d;Understanding recommendations for meals to include 15-35% energy as protein, 25-35% energy from fat, 35-60% energy from carbohydrates, less than 200mg  of dietary cholesterol, 20-35 gm of total fiber daily;Understanding of distribution of calorie intake throughout the day with the consumption of 4-5 meals/snacks    Hypertension Yes    Intervention Provide education on lifestyle modifcations including regular physical activity/exercise, weight management, moderate sodium restriction and increased consumption of fresh fruit, vegetables, and low fat dairy, alcohol moderation, and smoking cessation.;Monitor prescription use compliance.    Expected Outcomes Short Term: Continued assessment and intervention  until BP is < 140/46mm HG in hypertensive participants. < 130/49mm HG in hypertensive participants with diabetes, heart failure or chronic kidney disease.;Long Term: Maintenance of blood pressure at goal levels.    Lipids Yes    Intervention Provide education and support for participant on nutrition & aerobic/resistive exercise along with prescribed medications to achieve LDL 70mg , HDL >40mg .    Expected Outcomes Short Term: Participant states understanding of desired cholesterol values and is compliant with medications prescribed. Participant is following exercise prescription and nutrition guidelines.;Long Term: Cholesterol controlled with medications as prescribed, with individualized exercise RX and with personalized nutrition plan. Value goals: LDL < 70mg , HDL > 40 mg.             Education:Diabetes - Individual verbal and written instruction to review signs/symptoms of diabetes, desired ranges of glucose level fasting, after meals and with exercise. Acknowledge that pre and post exercise glucose checks will be done for 3 sessions at entry of program.   Core Components/Risk Factors/Patient Goals Review:    Core  Components/Risk Factors/Patient Goals at Discharge (Final Review):    ITP Comments:  ITP Comments     Row Name 08/07/23 1323 08/22/23 1555 08/29/23 1104       ITP Comments Initial phone call completed. Diagnosis can be found in [facility] 6/28. EP Orientation scheduled for Wednesday 8/28 at 2:30. Completed and gym orientation. Initial ITP created and sent for review to Dr. Bethann Punches, Medical Director. 30 Day review completed. Medical Director ITP review done, changes made as directed, and signed approval by Medical Director.     new to program              Comments:

## 2023-08-30 ENCOUNTER — Encounter: Payer: 59 | Admitting: *Deleted

## 2023-08-30 DIAGNOSIS — Z951 Presence of aortocoronary bypass graft: Secondary | ICD-10-CM

## 2023-08-30 NOTE — Progress Notes (Signed)
Daily Session Note  Patient Details  Name: Bobby [name] MRN: [id number] Date of Birth: [birth year] Referring Provider:   Flowsheet Row Cardiac Rehab from 08/22/2023 in [facility] Cardiac [facility] Pulmonary Rehab  Referring Provider Gollan       Encounter Date: 08/30/2023  Check In:  Session Check In - 08/30/23 1524       Check-In   Supervising physician immediately available to respond to emergencies See telemetry face sheet for immediately available ER MD    Location [facility]-Cardiac & Pulmonary Rehab    Staff Present Susann Givens, RN BSN;Maxon Conetta BS, , Exercise Physiologist;Megan Katrinka Blazing, RN, ADN    Virtual Visit No    Medication changes reported     No    Fall or balance concerns reported    No    Warm-up and Cool-down Performed on first and last piece of equipment    Resistance Training Performed Yes    VAD Patient? No    PAD/SET Patient? No      Pain Assessment   Currently in Pain? No/denies                Social History   Tobacco Use  Smoking Status Former   Current packs/day: 0.00   Average packs/day: 0.5 packs/day for 20.0 years (10.0 ttl pk-yrs)   Types: Cigarettes   Start date: 07/13/1997   Quit date: 07/13/2017   Years since quitting: 6.1  Smokeless Tobacco Never  Tobacco Comments   quit 2012    Goals Met:  Independence with exercise equipment Exercise tolerated well No report of concerns or symptoms today Strength training completed today  Goals Unmet:  Not Applicable  Comments: Pt able to follow exercise prescription today without complaint.  Will continue to monitor for progression.    Dr. Bethann Punches is Medical Director for [facility] Cardiac Rehabilitation.  Dr. Vida Rigger is Medical Director for [facility] Pulmonary Rehabilitation.

## 2023-08-31 NOTE — Progress Notes (Unsigned)
Cad Cardiology Office Note  Date:  09/03/2023   ID:  Bobby [name], DOB 1971-01-22, MRN [id number]  PCP:  Dana Allan, MD   Chief Complaint  Patient presents with   Follow up s/p CABG     Patient c/o difficulty breathing with lying flat. Medications reviewed by the patient verbally.     HPI:  Bobby [name] is a 52 year old gentleman with past medical history of Hypotestosterone Hypothyroid, on supplement Hx of smoking CAD, inferior wall MI, stenting to RCA 2003 Catheterization July 2018, occluded RCA with collaterals, Anterior wall STEMI July 13, 2017, status post anterior wall MI 06/2017 s/p successful LAD revascularization EF 40 to 45% in July 2021, anterior, anteroseptal akinesis August 2021: Cardiac catheterization RCA: Prox in-stent CTO, previously known. Left-to-right collaterals Who presents for follow-up of his coronary artery disease, new diagnosis of atrial fibrillation in the ER June 15, 2023, s/p CABG with LIMA to the OM1 on June 25, 2023  Seen by myself in clinic June 2024 At that time was having anginal symptoms, cardiac catheterization scheduled  Catheterization June 22, 2023 showing severe two-vessel coronary disease 90% ostial left circumflex, chronic total occlusion proximal RCA stent Fraction 25 to 35% Was transferred to [facility] for CABG  CORONARY ARTERY BYPASS GRAFTING (CABG) x1 USING LEFT INTERNAL MAMMARY ARTERY (LIMA) (N/A)  End of June 25, 2023 Post procedure incision opened up required packing  Moving into a new house, has been stressful Appreciating shortness of breath when lying supine 08/28/23: Worsening SOB, went to the ER, elevated BNP Cardiac CTA detailing no PE  On CPAP at night Active, does cardiac rehab  EKG personally reviewed by myself on todays visit EKG Interpretation Date/Time:  Monday September 03 2023 15:56:37 EDT Ventricular Rate:  66 PR Interval:  164 QRS Duration:  104 QT Interval:  432 QTC Calculation: 452 R Axis:   -40  Text  Interpretation: Normal sinus rhythm Left axis deviation Minimal voltage criteria for LVH, may be normal variant ( Cornell product ) Nonspecific T wave abnormality When compared with ECG of 28-Aug-2023 09:58, QRS axis Shifted left Non-specific change in ST segment in Lateral leads T wave inversion no longer evident in Inferior leads T wave inversion no longer evident in Lateral leads Confirmed by Julien Nordmann [phone]) on 09/03/2023 4:29:46 PM    Previously seen by cardiology June 08, 2022 [facility] in [place]  Several weeks ago,chest pain Helped friend move, had chest pain Stuttering since then June 21 developed severe pain concerning for angina, took nitro with short-term relief but symptoms came back Seen in the emergency room June 15, 2023 Reported having chest pain, shortness of breath Found to be in new onset atrial fibrillation Started on Eliquis 5 twice daily Converting to normal sinus rhythm in emergency room on diltiazem infusion  Blood pressure low on today's visit Unable to add long-acting nitroglycerin Tolerating carvedilol 3.25 twice daily  Tolerating Lipitor and Zetia with Repatha Cholesterol at goal  remote history of stenting x2 to the RCA 2003,  Cardiac catheterization July 13, 2017 noted to have occluded RCA, as well as 100% proximal LAD disease, 80% diagonal disease, 70% OM disease collaterals from left to right  Repeat catheterization August 21, 2017, patent LAD stent, occluded proximal RCA in-stent restenosis, 50% ramus disease  hospitalized in early July 2021 with nausea, vomiting, and diarrhea. Patient underwent EGD that showed erythematous mucosa in stomach antrum, and duodenum. Biopsy was taken. Prontonix and outpatient f/u were recommended. Pathology showed no malignancy.  Cardiac testing and imaging as below Coronary angiography 08/10/2020: LM: Normal LAD: Patent ostial LAD stent. 20% ostial diagonal stenosis LCx: 20% OM1 stenosis RCA: Prox  in-stent CTO. Left-to-left collaterals fill up to mid RCA Normal LVEDP   Echocardiogram 07/16/2020:  Poor visualization of endocardial borders.  Left ventricle cavity is  normal in size and thickness. Anterior/anteroseptal akinesis. LVEF  probably 40-45%.  Indeterminate diastolic filling pattern.  No significant valvular abnormalities.  normal right atrial pressure.  No significant change compared to previous study in 2018.   Real-time Outpatient Cardiac Telemetry 04/01/2020 - 04/15/2020: Dominant rhythm: Sinus. HR 44-150 bpm. Avg HR 72 bpm. Supraventricular ectopy, PAC/upto 6 beat run. SVE burden <1% Ventricular ectopy, PVC, NSVT upto 5 beats. VE burden <1% No atrial fibrillation/atrial flutter/SVT/high grade AV block, sinus pause >3sec noted. 12 patient activated events do not correlate with arrhthymias.    Stress perfusion MRI 11/07/2017: 1. Moderately dilated left ventricle with normal wall thickness and moderately decreased systolic function (LVEF = 43%). There is hypokinesis of the basal anteroseptal, anterior walls and akinesis of the mid inferoseptal, anteroseptal, anterior, all apical walls including true apex. A large infarct in the basal and mid inferior, inferoseptal, anteroseptal, anterior walls and in all the apical walls with no ischemia. 2. Normal right ventricular size, thickness and systolic function (LVEF = 56%). There are no regional wall motion abnormalities. 3.  Mildly dilated left atrium. 4. Mild mitral and tricuspid regurgitation. Collectively, these findings are consistent with an ischemic cardiomyopathy with a large infarct in the LAD and RCA territory with no peri-infarct ischemia. There is poor chance of recovery if Revascularized.   Cath 08/21/2017: LM: Normal LAD: Patent ostial LAD stent. 20% ostial diagonal stenosis Ramus: 50% proximal stenosis RCA: Not engaged today. Known RCA CTO   Elevated LVEDP LVEF 45%   PMH:   has a past medical history  of Acute MI anterior wall first episode care [facility]) (07/13/2017), Cardiogenic shock (HCC) (07/13/2017), CHF (congestive heart failure) (HCC), Graves disease, Hypertension, MI (myocardial infarction) (HCC), and Thyroid disease.  PSH:    Past Surgical History:  Procedure Laterality Date   ABDOMINAL AORTOGRAM N/A 06/22/2023   Procedure: ABDOMINAL AORTOGRAM;  Surgeon: Yvonne Kendall, MD;  Location: [facility] INVASIVE CV LAB;  Service: Cardiovascular;  Laterality: N/A;   CORONARY ANGIOPLASTY WITH STENT PLACEMENT     CORONARY ARTERY BYPASS GRAFT N/A 06/25/2023   Procedure: CORONARY ARTERY BYPASS GRAFTING (CABG) x1 USING LEFT INTERNAL MAMMARY ARTERY (LIMA);  Surgeon: Loreli Slot, MD;  Location: [facility] OR;  Service: Open Heart Surgery;  Laterality: N/A;   CORONARY/GRAFT ACUTE MI REVASCULARIZATION N/A 07/13/2017   Procedure: Coronary/Graft Acute MI Revascularization;  Surgeon: Yates Decamp, MD;  Location: [facility] INVASIVE CV LAB;  Service: Cardiovascular;  Laterality: N/A;   ESOPHAGOGASTRODUODENOSCOPY N/A 06/30/2020   Procedure: ESOPHAGOGASTRODUODENOSCOPY (EGD);  Surgeon: Toledo, Boykin Nearing, MD;  Location: [facility] ENDOSCOPY;  Service: Gastroenterology;  Laterality: N/A;   LEFT HEART CATH AND CORONARY ANGIOGRAPHY N/A 07/13/2017   Procedure: Left Heart Cath and Coronary Angiography;  Surgeon: Yates Decamp, MD;  Location: [facility] INVASIVE CV LAB;  Service: Cardiovascular;  Laterality: N/A;   LEFT HEART CATH AND CORONARY ANGIOGRAPHY N/A 08/21/2017   Procedure: LEFT HEART CATH AND CORONARY ANGIOGRAPHY;  Surgeon: Elder Negus, MD;  Location: [facility] INVASIVE CV LAB;  Service: Cardiovascular;  Laterality: N/A;   LEFT HEART CATH AND CORONARY ANGIOGRAPHY N/A 08/10/2020   Procedure: LEFT HEART CATH AND CORONARY ANGIOGRAPHY;  Surgeon: Elder Negus, MD;  Location: [facility]  INVASIVE CV LAB;  Service: Cardiovascular;  Laterality: N/A;   RIGHT/LEFT HEART CATH AND CORONARY ANGIOGRAPHY N/A 06/22/2023   Procedure: RIGHT/LEFT HEART CATH AND  CORONARY ANGIOGRAPHY;  Surgeon: Yvonne Kendall, MD;  Location: [facility] INVASIVE CV LAB;  Service: Cardiovascular;  Laterality: N/A;   SKIN GRAFT     TEE WITHOUT CARDIOVERSION N/A 06/25/2023   Procedure: TRANSESOPHAGEAL ECHOCARDIOGRAM;  Surgeon: Loreli Slot, MD;  Location: [facility] OR;  Service: Open Heart Surgery;  Laterality: N/A;   VENTRICULAR ASSIST DEVICE INSERTION N/A 07/13/2017   Procedure: Ventricular Assist Device Insertion;  Surgeon: Yates Decamp, MD;  Location: [facility] INVASIVE CV LAB;  Service: Cardiovascular;  Laterality: N/A;    Current Outpatient Medications  Medication Sig Dispense Refill   amiodarone (PACERONE) 200 MG tablet Take 200 mg by mouth daily.     apixaban (ELIQUIS) 5 MG TABS tablet Take 1 tablet (5 mg total) by mouth 2 (two) times daily. 60 tablet 5   aspirin 81 MG chewable tablet Chew 1 tablet (81 mg total) by mouth daily. 30 tablet 11   atorvastatin (LIPITOR) 20 MG tablet Take 1 tablet (20 mg total) by mouth daily. 90 tablet 0   carvedilol (COREG) 3.125 MG tablet Take 1 tablet (3.125 mg total) by mouth 2 (two) times daily. 180 tablet 3   dapagliflozin propanediol (FARXIGA) 10 MG TABS tablet Take 1 tablet (10 mg total) by mouth daily before breakfast. 90 tablet 2   Evolocumab (REPATHA SURECLICK) 140 MG/ML SOAJ Inject 140mg  into the skin once every 14 days. 2 mL 0   ezetimibe (ZETIA) 10 MG tablet Take 1 tablet (10 mg total) by mouth daily. 90 tablet 0   levothyroxine (SYNTHROID) 137 MCG tablet Take 1 tablet (137 mcg total) by mouth daily. 90 tablet 0   pantoprazole (PROTONIX) 40 MG tablet Take 1 tablet (40 mg total) by mouth 2 (two) times daily. 60 tablet 3   sildenafil (VIAGRA) 100 MG tablet Take 1 tablet (100 mg total) by mouth as needed for erectile dysfunction. TAKE 1 (ONE) TABLET AS NEEDED. DO NOT USE AT SAME TIME AS NITRO 30 tablet 3   No current facility-administered medications for this visit.    Allergies:   Patient has no known allergies.   Social History:  The  patient  reports that he quit smoking about 6 years ago. His smoking use included cigarettes. He started smoking about 26 years ago. He has a 10 pack-year smoking history. He has never used smokeless tobacco. He reports that he does not currently use alcohol. He reports that he does not currently use drugs.   Family History:   family history includes Heart disease in his paternal grandmother; Heart disease (age of onset: 38) in his father; Hypertension in his father; Valvular heart disease in his mother.    Review of Systems: Review of Systems  Constitutional: Negative.   HENT: Negative.    Respiratory: Negative.    Cardiovascular: Negative.   Gastrointestinal: Negative.   Musculoskeletal: Negative.   Neurological: Negative.   Psychiatric/Behavioral: Negative.    All other systems reviewed and are negative.   PHYSICAL EXAM: VS:  BP 110/80 (BP Location: Left Arm, Patient Position: Sitting, Cuff Size: Normal)   Pulse 66   Ht 6\' 1"  (1.854 m)   Wt 220 lb 2 oz (99.8 kg)   SpO2 97%   BMI 29.04 kg/m  , BMI Body mass index is 29.04 kg/m. Constitutional:  oriented to person, place, and time. No distress.  HENT:  Head:  Grossly normal Eyes:  no discharge. No scleral icterus.  Neck: No JVD, no carotid bruits  Cardiovascular: Regular rate and rhythm, no murmurs appreciated Pulmonary/Chest: Clear to auscultation bilaterally, no wheezes or rails Abdominal: Soft.  no distension.  no tenderness.  Musculoskeletal: Normal range of motion Neurological:  normal muscle tone. Coordination normal. No atrophy Skin: Skin warm and dry Psychiatric: normal affect, pleasant  Recent Labs: 06/15/2023: ALT 15; TSH 4.699 06/26/2023: Magnesium 2.1 08/28/2023: B Natriuretic Peptide 455.2; BUN 14; Creatinine, Ser 1.17; Hemoglobin 13.4; Platelets 201; Potassium 4.4; Sodium 140    Lipid Panel Lab Results  Component Value Date   CHOL 71 06/13/2023   HDL 40.90 06/13/2023   LDLCALC 18 06/13/2023   TRIG 59.0  06/13/2023    Wt Readings from Last 3 Encounters:  09/03/23 220 lb 2 oz (99.8 kg)  08/22/23 221 lb 12.8 oz (100.6 kg)  07/31/23 221 lb 9.6 oz (100.5 kg)     ASSESSMENT AND PLAN:  Problem List Items Addressed This Visit       Cardiology Problems   Paroxysmal atrial fibrillation (HCC)   Relevant Medications   amiodarone (PACERONE) 200 MG tablet   Other Relevant Orders   EKG 12-Lead (Completed)   HTN (hypertension)   Relevant Medications   amiodarone (PACERONE) 200 MG tablet   Ischemic cardiomyopathy   Relevant Medications   amiodarone (PACERONE) 200 MG tablet   Other Relevant Orders   EKG 12-Lead (Completed)   HLD (hyperlipidemia)   Relevant Medications   amiodarone (PACERONE) 200 MG tablet   HFrEF (heart failure with reduced ejection fraction) (HCC)   Relevant Medications   amiodarone (PACERONE) 200 MG tablet   Aortic atherosclerosis (HCC)   Relevant Medications   amiodarone (PACERONE) 200 MG tablet   Other Relevant Orders   EKG 12-Lead (Completed)   CAD (coronary artery disease)   Relevant Medications   amiodarone (PACERONE) 200 MG tablet   Other Relevant Orders   EKG 12-Lead (Completed)   Other Visit Diagnoses     Stable angina    -  Primary   Relevant Medications   amiodarone (PACERONE) 200 MG tablet   Other Relevant Orders   EKG 12-Lead (Completed)   Palpitations       Relevant Orders   EKG 12-Lead (Completed)      Coronary artery disease with stable angina Recent CABG, LIMA to the OM Prior stent to the LAD, occluded RCA with collaterals left to right Some shortness of breath Participating in cardiac rehab  Ischemic cardiomyopathy/chronic systolic CHF Depressed ejection fraction on echocardiogram continue low-dose carvedilol, Farxiga Blood pressure low limiting restart of spironolactone Given shortness of breath elevated BNP we will start Lasix 40 mg with potassium 20 daily as needed for abdominal swelling and shortness of breath  Paroxysmal  atrial fibrillation Maintaining normal sinus rhythm On carvedilol, amiodarone, Eliquis  Hyperlipidemia Numbers at goal on PCSK9 inhibitor.Repatha , atorvastatin, Zetia  Anxiety Managed by primary care   Total encounter time more than 30 minutes  Greater than 50% was spent in counseling and coordination of care with the patient    Signed, Dossie Arbour, M.D., Ph.D. [facility] [place], [place] [phone]

## 2023-09-03 ENCOUNTER — Ambulatory Visit: Payer: 59 | Attending: Cardiovascular Disease | Admitting: Cardiovascular Disease

## 2023-09-03 ENCOUNTER — Encounter: Payer: Self-pay | Admitting: Cardiovascular Disease

## 2023-09-03 ENCOUNTER — Ambulatory Visit: Payer: 59 | Admitting: Gastroenterology

## 2023-09-03 ENCOUNTER — Other Ambulatory Visit: Payer: Self-pay

## 2023-09-03 VITALS — BP 110/80 | HR 66 | Ht 73.0 in | Wt 220.1 lb

## 2023-09-03 DIAGNOSIS — R002 Palpitations: Secondary | ICD-10-CM | POA: Diagnosis not present

## 2023-09-03 DIAGNOSIS — I700 Atherosclerosis of aorta: Secondary | ICD-10-CM | POA: Diagnosis not present

## 2023-09-03 DIAGNOSIS — I10 Essential (primary) hypertension: Secondary | ICD-10-CM

## 2023-09-03 DIAGNOSIS — I255 Ischemic cardiomyopathy: Secondary | ICD-10-CM | POA: Diagnosis not present

## 2023-09-03 DIAGNOSIS — I5020 Unspecified systolic (congestive) heart failure: Secondary | ICD-10-CM

## 2023-09-03 DIAGNOSIS — I25118 Atherosclerotic heart disease of native coronary artery with other forms of angina pectoris: Secondary | ICD-10-CM | POA: Diagnosis not present

## 2023-09-03 DIAGNOSIS — I2089 Other forms of angina pectoris: Secondary | ICD-10-CM

## 2023-09-03 DIAGNOSIS — E782 Mixed hyperlipidemia: Secondary | ICD-10-CM

## 2023-09-03 DIAGNOSIS — I480 Paroxysmal atrial fibrillation: Secondary | ICD-10-CM | POA: Diagnosis not present

## 2023-09-03 MED ORDER — EZETIMIBE 10 MG PO TABS
10.0000 mg | ORAL_TABLET | Freq: Every day | ORAL | 3 refills | Status: DC
  Filled 2023-09-03 – 2023-10-21 (×2): qty 90, 90d supply, fill #0
  Filled 2024-01-28: qty 90, 90d supply, fill #1
  Filled 2024-04-22: qty 90, 90d supply, fill #2
  Filled 2024-07-21: qty 90, 90d supply, fill #3

## 2023-09-03 MED ORDER — REPATHA SURECLICK 140 MG/ML ~~LOC~~ SOAJ
140.0000 mg | SUBCUTANEOUS | 3 refills | Status: DC
  Filled 2023-09-03 – 2023-09-21 (×2): qty 2, 28d supply, fill #0
  Filled 2023-10-21: qty 2, 28d supply, fill #1
  Filled 2023-11-20: qty 2, 28d supply, fill #2
  Filled 2023-12-21: qty 2, 28d supply, fill #3

## 2023-09-03 MED ORDER — FUROSEMIDE 40 MG PO TABS
40.0000 mg | ORAL_TABLET | Freq: Every day | ORAL | 3 refills | Status: DC | PRN
  Filled 2023-09-03 (×2): qty 90, 90d supply, fill #0

## 2023-09-03 MED ORDER — AMIODARONE HCL 200 MG PO TABS
200.0000 mg | ORAL_TABLET | Freq: Every day | ORAL | 3 refills | Status: DC
  Filled 2023-09-03 – 2023-09-21 (×4): qty 90, 90d supply, fill #0
  Filled 2023-12-25: qty 90, 90d supply, fill #1

## 2023-09-03 MED ORDER — DAPAGLIFLOZIN PROPANEDIOL 10 MG PO TABS
10.0000 mg | ORAL_TABLET | Freq: Every day | ORAL | 3 refills | Status: DC
Start: 2023-09-03 — End: 2024-09-10
  Filled 2023-09-03: qty 90, 90d supply, fill #0
  Filled 2023-12-03: qty 90, 90d supply, fill #1
  Filled 2024-03-04: qty 90, 90d supply, fill #2
  Filled 2024-06-02: qty 90, 90d supply, fill #3

## 2023-09-03 MED ORDER — CARVEDILOL 3.125 MG PO TABS
3.1250 mg | ORAL_TABLET | Freq: Two times a day (BID) | ORAL | 3 refills | Status: DC
Start: 2023-09-03 — End: 2023-10-19
  Filled 2023-09-03: qty 180, 90d supply, fill #0

## 2023-09-03 MED ORDER — POTASSIUM CHLORIDE CRYS ER 20 MEQ PO TBCR
20.0000 meq | EXTENDED_RELEASE_TABLET | Freq: Every day | ORAL | 3 refills | Status: DC | PRN
  Filled 2023-09-03: qty 90, 90d supply, fill #0
  Filled 2024-04-22: qty 90, 90d supply, fill #1

## 2023-09-03 MED ORDER — APIXABAN 5 MG PO TABS
5.0000 mg | ORAL_TABLET | Freq: Two times a day (BID) | ORAL | 3 refills | Status: DC
  Filled 2023-09-03: qty 180, 90d supply, fill #0
  Filled 2023-09-19: qty 60, 30d supply, fill #0
  Filled 2023-09-19: qty 180, 90d supply, fill #0
  Filled 2023-10-21: qty 60, 30d supply, fill #1
  Filled 2023-11-20: qty 60, 30d supply, fill #2
  Filled 2023-12-21: qty 60, 30d supply, fill #3

## 2023-09-03 MED ORDER — ATORVASTATIN CALCIUM 20 MG PO TABS
20.0000 mg | ORAL_TABLET | Freq: Every day | ORAL | 3 refills | Status: DC
  Filled 2023-09-03 – 2023-10-05 (×3): qty 90, 90d supply, fill #0
  Filled 2024-01-03: qty 90, 90d supply, fill #1
  Filled 2024-04-01: qty 90, 90d supply, fill #2
  Filled 2024-07-08: qty 90, 90d supply, fill #3

## 2023-09-03 NOTE — Patient Instructions (Addendum)
Medication Instructions:  Lasix 40 mg daily as needed with potassium for shortness of breath, ABD swelling  If you need a refill on your cardiac medications before your next appointment, please call your pharmacy.   Lab work: No new labs needed  Testing/Procedures: No new testing needed  Follow-Up: At [facility], you and your health needs are our priority.  As part of our continuing mission to provide you with exceptional heart care, we have created designated Provider Care Teams.  These Care Teams include your primary Cardiologist (physician) and Advanced Practice Providers (APPs -  Physician Assistants and Nurse Practitioners) who all work together to provide you with the care you need, when you need it.  You will need a follow up appointment in 6 months  Providers on your designated Care Team:   Nicolasa Ducking, NP Eula Listen, PA-C Cadence Fransico Michael, [place]  COVID-19 Vaccine Information can be found at: [email or web address] For questions related to vaccine distribution or appointments, please email [email or web address] or call [phone].

## 2023-09-04 ENCOUNTER — Other Ambulatory Visit: Payer: Self-pay

## 2023-09-04 ENCOUNTER — Ambulatory Visit (INDEPENDENT_AMBULATORY_CARE_PROVIDER_SITE_OTHER): Payer: 59 | Admitting: Thoracic Surgery (Cardiothoracic Vascular Surgery)

## 2023-09-04 VITALS — BP 106/68 | HR 65 | Resp 20 | Ht 73.0 in | Wt 220.0 lb

## 2023-09-04 DIAGNOSIS — Z951 Presence of aortocoronary bypass graft: Secondary | ICD-10-CM

## 2023-09-04 NOTE — Progress Notes (Signed)
[address]       Bobby [name] [address]             [phone]     HPI: [name] returns for scheduled follow-up after coronary bypass grafting.  Bobby [name] is a 52 year old man with a history of hypertension, hyperlipidemia, paroxysmal atrial fibrillation, CAD, HFrEF, and hypothyroidism.  He presented in June with chest pain and was found to have a 90% ostial stenosis of the circumflex.  He had a patent stent in his LAD.  He had chronic total occlusion of his right coronary.  I did coronary bypass grafting x 1 with a LIMA to OM1 on 06/25/2023.  His initial postoperative course was uncomplicated.  I saw him in the office at the end of July and he had a superficial sternal wound dehiscence.  That was treated with saline wet-to-dry dressing changes.  That has subsequently healed.  He has some mild incisional discomfort but is not taking any pain medication.  Last week he went to the ED with shortness of breath.  CT angio ruled out a PE.  His troponins were negative but his BNP was elevated.  He was started on Lasix but has not started taking that medication yet.  Past Medical History:  Diagnosis Date   Acute MI anterior wall first episode care (HCC) 07/13/2017   Occluded ostial LAD S/P 3.5x26 Onyx DES. RI 80%. RCA Occluded at prior stent site with collaterals    Cardiogenic shock (HCC) 07/13/2017   CHF (congestive heart failure) (HCC)    Graves disease    Hypertension    MI (myocardial infarction) (HCC)    Thyroid disease    thyroid irradiated per pt    Current Outpatient Medications  Medication Sig Dispense Refill   amiodarone (PACERONE) 200 MG tablet Take 1 tablet (200 mg total) by mouth daily. 90 tablet 3   apixaban (ELIQUIS) 5 MG TABS tablet Take 1 tablet (5 mg total) by mouth 2 (two) times daily. 180 tablet 3   aspirin 81 MG chewable tablet Chew 1 tablet (81 mg total) by mouth daily. 30 tablet 11   atorvastatin (LIPITOR) 20 MG tablet Take 1 tablet (20 mg  total) by mouth daily. 90 tablet 3   carvedilol (COREG) 3.125 MG tablet Take 1 tablet (3.125 mg total) by mouth 2 (two) times daily. 180 tablet 3   dapagliflozin propanediol (FARXIGA) 10 MG TABS tablet Take 1 tablet (10 mg total) by mouth daily before breakfast. 90 tablet 3   Evolocumab (REPATHA SURECLICK) 140 MG/ML SOAJ Inject 140mg  into the skin once every 14 days. 2 mL 3   ezetimibe (ZETIA) 10 MG tablet Take 1 tablet (10 mg total) by mouth daily. 90 tablet 3   furosemide (LASIX) 40 MG tablet Take 1 tablet (40 mg total) by mouth daily as needed. Take for shortness of breath or abdominal swelling. 90 tablet 3   levothyroxine (SYNTHROID) 137 MCG tablet Take 1 tablet (137 mcg total) by mouth daily. 90 tablet 0   pantoprazole (PROTONIX) 40 MG tablet Take 1 tablet (40 mg total) by mouth 2 (two) times daily. 60 tablet 3   potassium chloride SA (KLOR-CON M20) 20 MEQ tablet Take 1 tablet (20 mEq total) by mouth daily as needed. Take as needed with Lasix. 90 tablet 3   sildenafil (VIAGRA) 100 MG tablet Take 1 tablet (100 mg total) by mouth as needed for erectile dysfunction. TAKE 1 (ONE) TABLET AS NEEDED. DO NOT USE  AT SAME TIME AS NITRO 30 tablet 3   No current facility-administered medications for this visit.    Physical Exam BP 106/68   Pulse 65   Resp 20   Ht 6\' 1"  (1.854 m)   Wt 220 lb (99.8 kg)   SpO2 98% Comment: RA  BMI 29.70 kg/m  52 year old man in no acute distress Alert and oriented x 3 with no focal deficits Lungs clear bilaterally Sternotomy incision healed, sternum stable Cardiac regular rate and rhythm  Diagnostic Tests: CT ANGIOGRAPHY CHEST WITH CONTRAST   TECHNIQUE: Multidetector CT imaging of the chest was performed using the standard protocol during bolus administration of intravenous contrast. Multiplanar CT image reconstructions and MIPs were obtained to evaluate the vascular anatomy.   RADIATION DOSE REDUCTION: This exam was performed according to  the departmental dose-optimization program which includes automated exposure control, adjustment of the mA and/or kV according to patient size and/or use of iterative reconstruction technique.   CONTRAST:  75mL OMNIPAQUE IOHEXOL 350 MG/ML SOLN   COMPARISON:  None Available.   FINDINGS: Cardiovascular: Satisfactory opacification of the pulmonary arteries to the segmental level. No evidence of pulmonary embolism. Normal heart size. No pericardial effusion. Status post coronary artery bypass graft.   Mediastinum/Nodes: No enlarged mediastinal, hilar, or axillary lymph nodes. Thyroid gland, trachea, and esophagus demonstrate no significant findings.   Lungs/Pleura: No pneumothorax or pleural effusion is noted. Minimal bilateral posterior basilar subsegmental atelectasis.   Upper Abdomen: No acute abnormality.   Musculoskeletal: No chest wall abnormality. No acute or significant osseous findings.   Review of the MIP images confirms the above findings.   IMPRESSION: No definite evidence of pulmonary embolus.   Status post coronary artery bypass graft.     Electronically Signed   By: Lupita Raider M.D.   On: 08/28/2023 15:45 I personally reviewed the CT images from 08/28/2023.  Status post sternotomy.  Minimal bilateral pleural effusions, no pericardial effusion.  No pulmonary embolus.  Impression: Bobby [name] is a 52 year old man with a history of hypertension, hyperlipidemia, paroxysmal atrial fibrillation, CAD, HFrEF, and hypothyroidism.  He presented in June with chest pain and was found to have a 90% ostial stenosis of the circumflex.    Status post CABG x 1-now about 2-1/2 months out from surgery.  Minimal discomfort.  No recurrent anginal symptoms.  Did have an episode of shortness of breath associated with an elevated BNP last week.  Superficial sternal wound dehiscence-wound has healed.  No restrictions on his activities from my standpoint.   Plan: Follow-up with  Dr. Mariah Milling I will be happy to see Bobby [name] back anytime in the future if I can be of any further assistance with his care  Loreli Slot, MD [facility] [phone]

## 2023-09-05 ENCOUNTER — Encounter: Payer: 59 | Admitting: *Deleted

## 2023-09-05 DIAGNOSIS — Z951 Presence of aortocoronary bypass graft: Secondary | ICD-10-CM | POA: Diagnosis not present

## 2023-09-05 NOTE — Progress Notes (Signed)
Daily Session Note  Patient Details  Name: Bobby [name] MRN: [id number] Date of Birth: 09-06-1971 Referring Provider:   Flowsheet Row Cardiac Rehab from 08/22/2023 in [facility] Cardiac and Pulmonary Rehab  Referring Provider Gollan       Encounter Date: 09/05/2023  Check In:  Session Check In - 09/05/23 1544       Check-In   Supervising physician immediately available to respond to emergencies See telemetry face sheet for immediately available ER MD    Location [facility]-Cardiac & Pulmonary Rehab    Staff Present Susann Givens, RN BSN;Maxon Conetta BS, , Exercise Physiologist;Joseph [place], [place]    Virtual Visit No    Medication changes reported     Yes    Comments added lasix and potassium    Fall or balance concerns reported    No    Warm-up and Cool-down Performed on first and last piece of equipment    Resistance Training Performed Yes    VAD Patient? No    PAD/SET Patient? No      Pain Assessment   Currently in Pain? No/denies                Social History   Tobacco Use  Smoking Status Former   Current packs/day: 0.00   Average packs/day: 0.5 packs/day for 20.0 years (10.0 ttl pk-yrs)   Types: Cigarettes   Start date: 07/13/1997   Quit date: 07/13/2017   Years since quitting: 6.1  Smokeless Tobacco Never  Tobacco Comments   quit 2012    Goals Met:  Independence with exercise equipment Exercise tolerated well No report of concerns or symptoms today Strength training completed today  Goals Unmet:  Not Applicable  Comments: Pt able to follow exercise prescription today without complaint.  Will continue to monitor for progression.    Dr. Bethann Punches is Medical Director for [facility] Cardiac Rehabilitation.  Dr. Vida Rigger is Medical Director for [facility] Pulmonary Rehabilitation.

## 2023-09-06 ENCOUNTER — Encounter: Payer: 59 | Admitting: *Deleted

## 2023-09-06 ENCOUNTER — Ambulatory Visit: Payer: 59 | Attending: Cardiology | Admitting: Cardiology

## 2023-09-06 DIAGNOSIS — Z951 Presence of aortocoronary bypass graft: Secondary | ICD-10-CM

## 2023-09-06 NOTE — Progress Notes (Signed)
Daily Session Note  Patient Details  Name: Donivan Burau MRN: [id number] Date of Birth: 29-Aug-1971 Referring Provider:   Flowsheet Row Cardiac Rehab from 08/22/2023 in [facility] Cardiac and Pulmonary Rehab  Referring Provider Gollan       Encounter Date: 09/06/2023  Check In:  Session Check In - 09/06/23 1530       Check-In   Supervising physician immediately available to respond to emergencies See telemetry face sheet for immediately available ER MD    Location [facility]-Cardiac & Pulmonary Rehab    Staff Present Maxon Conetta BS, , Exercise Physiologist;Joseph Rod Can, RN BSN    Virtual Visit No    Medication changes reported     No    Fall or balance concerns reported    No    Warm-up and Cool-down Performed on first and last piece of equipment    Resistance Training Performed Yes    VAD Patient? No    PAD/SET Patient? No      Pain Assessment   Currently in Pain? No/denies                Social History   Tobacco Use  Smoking Status Former   Current packs/day: 0.00   Average packs/day: 0.5 packs/day for 20.0 years (10.0 ttl pk-yrs)   Types: Cigarettes   Start date: 07/13/1997   Quit date: 07/13/2017   Years since quitting: 6.1  Smokeless Tobacco Never  Tobacco Comments   quit 2012    Goals Met:  Independence with exercise equipment Exercise tolerated well No report of concerns or symptoms today Strength training completed today  Goals Unmet:  Not Applicable  Comments: Pt able to follow exercise prescription today without complaint.  Will continue to monitor for progression.    Dr. Bethann Punches is Medical Director for [facility] Cardiac Rehabilitation.  Dr. Vida Rigger is Medical Director for [facility] Pulmonary Rehabilitation.

## 2023-09-06 NOTE — Progress Notes (Deleted)
Cardiology Office Note:  .   Date:  09/06/2023  ID:  Bobby [name], DOB [birth year], MRN [id number] PCP: Dana Allan, MD  [facility] Providers Cardiologist:  Julien Nordmann, MD { Click to update primary MD,subspecialty MD or APP then REFRESH:1}   History of Present Illness: .   Bobby [name] is a 52 y.o. male with a past medical history of coronary artery disease, status post CABG x 1 (06/2023), ischemic cardiomyopathy, mixed hyperlipidemia, primary hypertension, paroxysmal atrial fibrillation, palpitations, HFrEF, GERD, hypothyroidism, right carotid bruit, anxiety, prediabetes, who is here today for follow-up.  Previously had inferior wall MI which required stenting to the RCA in 2003.  Repeat catheterization in July 2019 showed occluded RCA with collaterals.  Anterior wall STEMI in July 2018 status post anterior wall MI status post successful LAD revascularization but revealed a drop in his LVEF to 40-45% in 06/2020.  Anterior, anteroseptal, akinesis.  August 2021 he underwent repeat heart catheterization RCA had proximal in-stent CTO that was previously known and left-to-right collaterals.  Presented to the [facility] emergency room on 06/15/2023 and was found to be in new onset atrial fibrillation.  Was started on apixaban 5 mg twice daily for stroke prophylaxis and converted to normal sinus rhythm in the emergency department on diltiazem infusion.  He was continued on carvedilol 3.125 mg twice daily.. continued with anginal symptoms and cardiac catheterization was scheduled for 05/2023 which revealed severe two-vessel coronary disease 90% ostial left circumflex, chronic total occlusion of proximal RCA stent, ejection fraction dropped to 25-25% he was transferred to [facility] for CABG.  On 06/25/2023 he underwent CABG x 1 using LIMA-OM1.  Hospitalization was uncomplicated.  He was able to be discharged from the facility 06/29/2023.  He was last seen in clinic 09/03/2023 by Dr. Mariah Milling.  At that time  he was having difficulty breathing when lying flat.  EKG revealed sinus rhythm.  He was started on furosemide 40 mg daily with potassium 20 mEq daily.  He returns to clinic today    ROS: 10 point review of systems has been reviewed and considered negative with exception of what is been listed in the HPI  Studies Reviewed: Marland Kitchen     TTE 06/22/23 1. Left ventricular ejection fraction, by estimation, is 25 to 30%. The  left ventricle has severely decreased function. The left ventricle  demonstrates regional wall motion abnormalities (see scoring  diagram/findings for description). There is mild left   ventricular hypertrophy. Left ventricular diastolic parameters are  consistent with Grade I diastolic dysfunction (impaired relaxation). There  is akinesis of the left ventricular, entire anterior wall and apical  segment.   2. Right ventricular systolic function is mildly reduced. The right  ventricular size is mildly enlarged. Tricuspid regurgitation signal is  inadequate for assessing PA pressure.   3. The mitral valve was not well visualized. No evidence of mitral valve  regurgitation. No evidence of mitral stenosis.   4. The aortic valve was not well visualized. Aortic valve regurgitation  is not visualized. No aortic stenosis is present.     R/LHC 06/22/23 Conclusions: Significant two-vessel coronary artery disease, including 90% ostial LCx stenosis (ostium appears jailed by proximal LAD stent extending back into the distal LMCA) and chronic total occlusion of proximal RCA stent. Widely patent ostial/proximal LAD stent. Severely reduced left ventricular systolic function (LVEF 25-35%). Upper normal to mildly elevated left and right heart filling pressures (LVEDP 16 mmHg, PCWP 16 mm Hg, mean RA 7). Low normal to mildly  reduced cardiac output/index (Fick CO 5.1 L/min, CI 2.3 L/min/m^2). Angiographically normal abdominal aorta, iliac arteries, and right common femoral artery.    Recommendations: Transfer to [name] for cardiac surgery consultation.  I would favor bypass of OM1 +/- OM3 and distal RCA branches.  PCI would be high risk and would require bifurcation stenting from LMCA into OM1 with hemodynamic support if the patient is not a candidate for bypass. Start heparin infusion 2 hours after TR band removal. Consider consultation with advanced heart failure team when the patient reaches [name] to continue optimization of HFrEF in anticipation of revascularization. Aggressive secondary prevention of coronary artery disease.  Lexiscan MPI 08/01/22   Findings are consistent with prior myocardial infarction. The study is intermediate risk.   No ST deviation was noted.   LV perfusion is abnormal. There is no evidence of ischemia. There is evidence of infarction. Defect 1: There is a large defect with absent uptake present in the apical to mid anterior, anteroseptal and apex location(s) that is fixed. There is abnormal wall motion in the defect area. Consistent with infarction.   Left ventricular function is abnormal.  41%.  End diastolic cavity size is mildly enlarged. End systolic cavity size is mildly enlarged.   There is evidence of a large LAD infarct with minimal peri-infarct ischemia.  Coronary angiography 08/10/2020: LM: Normal LAD: Patent ostial LAD stent. 20% ostial diagonal stenosis LCx: 20% OM1 stenosis RCA: Prox in-stent CTO. Left-to-left collaterals fill up to mid RCA Normal LVEDP   Echocardiogram 07/16/2020:  Poor visualization of endocardial borders.  Left ventricle cavity is  normal in size and thickness. Anterior/anteroseptal akinesis. LVEF  probably 40-45%.  Indeterminate diastolic filling pattern.  No significant valvular abnormalities.  normal right atrial pressure.  No significant change compared to previous study in 2018.   Real-time Outpatient Cardiac Telemetry 04/01/2020 - 04/15/2020: Dominant rhythm: Sinus. HR 44-150 bpm. Avg HR 72  bpm. Supraventricular ectopy, PAC/upto 6 beat run. SVE burden <1% Ventricular ectopy, PVC, NSVT upto 5 beats. VE burden <1% No atrial fibrillation/atrial flutter/SVT/high grade AV block, sinus pause >3sec noted. 12 patient activated events do not correlate with arrhthymias.    Stress perfusion MRI 11/07/2017: 1. Moderately dilated left ventricle with normal wall thickness and moderately decreased systolic function (LVEF = 43%). There is hypokinesis of the basal anteroseptal, anterior walls and akinesis of the mid inferoseptal, anteroseptal, anterior, all apical walls including true apex. A large infarct in the basal and mid inferior, inferoseptal, anteroseptal, anterior walls and in all the apical walls with no ischemia. 2. Normal right ventricular size, thickness and systolic function (LVEF = 56%). There are no regional wall motion abnormalities. 3.  Mildly dilated left atrium. 4. Mild mitral and tricuspid regurgitation. Collectively, these findings are consistent with an ischemic cardiomyopathy with a large infarct in the LAD and RCA territory with no peri-infarct ischemia. There is poor chance of recovery if Revascularized.   Cath 08/21/2017: LM: Normal LAD: Patent ostial LAD stent. 20% ostial diagonal stenosis Ramus: 50% proximal stenosis RCA: Not engaged today. Known RCA CTO   Elevated LVEDP LVEF 45% Risk Assessment/Calculations:    CHA2DS2-VASc Score =    {Click here to calculate score.  REFRESH note before signing. :1} This indicates a  % annual risk of stroke. The patient's score is based upon:      No BP recorded.  {Refresh Note OR Click here to enter BP  :1}***       Physical Exam:   VS:  There were no vitals taken for this visit.   Wt Readings from Last 3 Encounters:  09/04/23 220 lb (99.8 kg)  09/03/23 220 lb 2 oz (99.8 kg)  08/22/23 221 lb 12.8 oz (100.6 kg)    GEN: Well nourished, well developed in no acute distress NECK: No JVD; No carotid  bruits CARDIAC: ***RRR, no murmurs, rubs, gallops RESPIRATORY:  Clear to auscultation without rales, wheezing or rhonchi  ABDOMEN: Soft, non-tender, non-distended EXTREMITIES:  No edema; No deformity   ASSESSMENT AND PLAN: .   ***    {Are you ordering a CV Procedure (e.g. stress test, cath, DCCV, TEE, etc)?   Press F2        :[id number]}  Dispo: ***  Signed, Rozanne Heumann, NP

## 2023-09-07 ENCOUNTER — Encounter: Payer: Self-pay | Admitting: Cardiology

## 2023-09-13 ENCOUNTER — Encounter: Payer: 59 | Admitting: *Deleted

## 2023-09-13 DIAGNOSIS — Z951 Presence of aortocoronary bypass graft: Secondary | ICD-10-CM | POA: Diagnosis not present

## 2023-09-13 NOTE — Progress Notes (Signed)
Daily Session Note  Patient Details  Name: Bobby [name] MRN: [id number] Date of Birth: 1971/09/10 Referring Provider:   Flowsheet Row Cardiac Rehab from 08/22/2023 in [facility] Cardiac and Pulmonary Rehab  Referring Provider Gollan       Encounter Date: 09/13/2023  Check In:  Session Check In - 09/13/23 1541       Check-In   Supervising physician immediately available to respond to emergencies See telemetry face sheet for immediately available ER MD    Location [facility]-Cardiac & Pulmonary Rehab    Staff Present Maxon Suzzette Righter, , Exercise Physiologist;Dora Clauss Jewel Baize, RN BSN;Joseph Reino [place], [place]    Virtual Visit No    Medication changes reported     No    Fall or balance concerns reported    No    Warm-up and Cool-down Performed on first and last piece of equipment    Resistance Training Performed Yes    VAD Patient? No    PAD/SET Patient? No      Pain Assessment   Currently in Pain? No/denies                Social History   Tobacco Use  Smoking Status Former   Current packs/day: 0.00   Average packs/day: 0.5 packs/day for 20.0 years (10.0 ttl pk-yrs)   Types: Cigarettes   Start date: 07/13/1997   Quit date: 07/13/2017   Years since quitting: 6.1  Smokeless Tobacco Never  Tobacco Comments   quit 2012    Goals Met:  Independence with exercise equipment Exercise tolerated well No report of concerns or symptoms today Strength training completed today  Goals Unmet:  Not Applicable  Comments: Pt able to follow exercise prescription today without complaint.  Will continue to monitor for progression.    Dr. Bethann Punches is Medical Director for [facility] Cardiac Rehabilitation.  Dr. Vida Rigger is Medical Director for [facility] Pulmonary Rehabilitation.

## 2023-09-17 ENCOUNTER — Encounter: Payer: 59 | Admitting: *Deleted

## 2023-09-17 DIAGNOSIS — Z951 Presence of aortocoronary bypass graft: Secondary | ICD-10-CM | POA: Diagnosis not present

## 2023-09-17 NOTE — Progress Notes (Signed)
Daily Session Note  Patient Details  Name: Bobby [name] MRN: [id number] Date of Birth: February 08, 1971 Referring Provider:   Flowsheet Row Cardiac Rehab from 08/22/2023 in [facility] Cardiac and Pulmonary Rehab  Referring Provider Gollan       Encounter Date: 09/17/2023  Check In:  Session Check In - 09/17/23 1537       Check-In   Supervising physician immediately available to respond to emergencies See telemetry face sheet for immediately available ER MD    Location [facility]-Cardiac & Pulmonary Rehab    Staff Present Susann Givens, RN BSN;Joseph Reino [place], RCP,RRT,BSRT;Maxon Pinole BS, , Exercise Physiologist    Virtual Visit No    Medication changes reported     No    Fall or balance concerns reported    No    Warm-up and Cool-down Performed on first and last piece of equipment    Resistance Training Performed Yes    VAD Patient? No    PAD/SET Patient? No      Pain Assessment   Currently in Pain? No/denies                Social History   Tobacco Use  Smoking Status Former   Current packs/day: 0.00   Average packs/day: 0.5 packs/day for 20.0 years (10.0 ttl pk-yrs)   Types: Cigarettes   Start date: 07/13/1997   Quit date: 07/13/2017   Years since quitting: 6.1  Smokeless Tobacco Never  Tobacco Comments   quit 2012    Goals Met:  Independence with exercise equipment Exercise tolerated well No report of concerns or symptoms today Strength training completed today  Goals Unmet:  Not Applicable  Comments: Pt able to follow exercise prescription today without complaint.  Will continue to monitor for progression.    Dr. Bethann Punches is Medical Director for [facility] Cardiac Rehabilitation.  Dr. Vida Rigger is Medical Director for [facility] Pulmonary Rehabilitation.

## 2023-09-19 ENCOUNTER — Other Ambulatory Visit: Payer: Self-pay

## 2023-09-19 ENCOUNTER — Encounter: Payer: 59 | Admitting: *Deleted

## 2023-09-19 DIAGNOSIS — Z951 Presence of aortocoronary bypass graft: Secondary | ICD-10-CM

## 2023-09-19 NOTE — Progress Notes (Signed)
Daily Session Note  Patient Details  Name: Bobby [name] MRN: [id number] Date of Birth: 08/05/1971 Referring Provider:   Flowsheet Row Cardiac Rehab from 08/22/2023 in [facility] Cardiac and Pulmonary Rehab  Referring Provider Gollan       Encounter Date: 09/19/2023  Check In:  Session Check In - 09/19/23 1527       Check-In   Supervising physician immediately available to respond to emergencies See telemetry face sheet for immediately available ER MD    Location [facility]-Cardiac & Pulmonary Rehab    Staff Present Tommye Standard, BS, ACSM CEP, Exercise Physiologist;Megan Katrinka Blazing, RN, ADN;Najmo Pardue Jewel Baize, RN BSN    Virtual Visit No    Medication changes reported     No    Fall or balance concerns reported    No    Warm-up and Cool-down Performed on first and last piece of equipment    Resistance Training Performed Yes    VAD Patient? No    PAD/SET Patient? No      Pain Assessment   Currently in Pain? No/denies                Social History   Tobacco Use  Smoking Status Former   Current packs/day: 0.00   Average packs/day: 0.5 packs/day for 20.0 years (10.0 ttl pk-yrs)   Types: Cigarettes   Start date: 07/13/1997   Quit date: 07/13/2017   Years since quitting: 6.1  Smokeless Tobacco Never  Tobacco Comments   quit 2012    Goals Met:  Independence with exercise equipment Exercise tolerated well No report of concerns or symptoms today Strength training completed today  Goals Unmet:  Not Applicable  Comments: Pt able to follow exercise prescription today without complaint.  Will continue to monitor for progression.    Dr. Bethann Punches is Medical Director for [facility] Cardiac Rehabilitation.  Dr. Vida Rigger is Medical Director for [facility] Pulmonary Rehabilitation.

## 2023-09-21 ENCOUNTER — Other Ambulatory Visit: Payer: Self-pay

## 2023-09-24 ENCOUNTER — Encounter: Payer: 59 | Admitting: *Deleted

## 2023-09-24 DIAGNOSIS — Z951 Presence of aortocoronary bypass graft: Secondary | ICD-10-CM | POA: Diagnosis not present

## 2023-09-24 NOTE — Progress Notes (Signed)
Daily Session Note  Patient Details  Name: Bobby [name] MRN: [id number] Date of Birth: 1971/07/21 Referring Provider:   Flowsheet Row Cardiac Rehab from 08/22/2023 in [facility] Cardiac and Pulmonary Rehab  Referring Provider Gollan       Encounter Date: 09/24/2023  Check In:  Session Check In - 09/24/23 1530       Check-In   Supervising physician immediately available to respond to emergencies See telemetry face sheet for immediately available ER MD    Location [facility]-Cardiac & Pulmonary Rehab    Staff Present Susann Givens, RN BSN;Maxon Conetta BS, , Exercise Physiologist;Joseph [place], [place]    Virtual Visit No    Medication changes reported     No    Fall or balance concerns reported    No    Warm-up and Cool-down Performed on first and last piece of equipment    Resistance Training Performed Yes    VAD Patient? No    PAD/SET Patient? No      Pain Assessment   Currently in Pain? No/denies                Social History   Tobacco Use  Smoking Status Former   Current packs/day: 0.00   Average packs/day: 0.5 packs/day for 20.0 years (10.0 ttl pk-yrs)   Types: Cigarettes   Start date: 07/13/1997   Quit date: 07/13/2017   Years since quitting: 6.2  Smokeless Tobacco Never  Tobacco Comments   quit 2012    Goals Met:  Independence with exercise equipment Exercise tolerated well No report of concerns or symptoms today Strength training completed today  Goals Unmet:  Not Applicable  Comments: Pt able to follow exercise prescription today without complaint.  Will continue to monitor for progression.    Dr. Bethann Punches is Medical Director for [facility] Cardiac Rehabilitation.  Dr. Vida Rigger is Medical Director for [facility] Pulmonary Rehabilitation.

## 2023-09-26 ENCOUNTER — Encounter: Payer: 59 | Attending: Thoracic Surgery (Cardiothoracic Vascular Surgery) | Admitting: *Deleted

## 2023-09-26 ENCOUNTER — Encounter: Payer: Self-pay | Admitting: *Deleted

## 2023-09-26 DIAGNOSIS — Z48812 Encounter for surgical aftercare following surgery on the circulatory system: Secondary | ICD-10-CM | POA: Diagnosis not present

## 2023-09-26 DIAGNOSIS — Z951 Presence of aortocoronary bypass graft: Secondary | ICD-10-CM

## 2023-09-26 NOTE — Progress Notes (Signed)
Daily Session Note  Patient Details  Name: Bobby [name] MRN: [id number] Date of Birth: 1971-02-09 Referring Provider:   Flowsheet Row Cardiac Rehab from 08/22/2023 in [facility] Cardiac and Pulmonary Rehab  Referring Provider Gollan       Encounter Date: 09/26/2023  Check In:  Session Check In - 09/26/23 1547       Check-In   Supervising physician immediately available to respond to emergencies See telemetry face sheet for immediately available ER MD    Location [facility]-Cardiac & Pulmonary Rehab    Staff Present Maxon Suzzette Righter, , Exercise Physiologist;Karmyn Lowman Jewel Baize, RN BSN;Joseph [place], [place]    Virtual Visit No    Medication changes reported     No    Fall or balance concerns reported    No    Warm-up and Cool-down Performed on first and last piece of equipment    Resistance Training Performed Yes    VAD Patient? No    PAD/SET Patient? No      Pain Assessment   Currently in Pain? No/denies                Social History   Tobacco Use  Smoking Status Former   Current packs/day: 0.00   Average packs/day: 0.5 packs/day for 20.0 years (10.0 ttl pk-yrs)   Types: Cigarettes   Start date: 07/13/1997   Quit date: 07/13/2017   Years since quitting: 6.2  Smokeless Tobacco Never  Tobacco Comments   quit 2012    Goals Met:  Independence with exercise equipment Exercise tolerated well No report of concerns or symptoms today Strength training completed today  Goals Unmet:  Not Applicable  Comments: Pt able to follow exercise prescription today without complaint.  Will continue to monitor for progression.    Dr. Bethann Punches is Medical Director for [facility] Cardiac Rehabilitation.  Dr. Vida Rigger is Medical Director for [facility] Pulmonary Rehabilitation.

## 2023-09-26 NOTE — Progress Notes (Signed)
Cardiac Individual Treatment Plan  Patient Details  Name: Bobby [name] MRN: [id number] Date of Birth: 1971-10-19 Referring Provider:   Flowsheet Row Cardiac Rehab from 08/22/2023 in [facility] Cardiac and Pulmonary Rehab  Referring Provider Gollan       Initial Encounter Date:  Flowsheet Row Cardiac Rehab from 08/22/2023 in [facility] Cardiac and Pulmonary Rehab  Date 08/22/23       Visit Diagnosis: S/P CABG x 1  Patient's Home Medications on Admission:  Current Outpatient Medications:    amiodarone (PACERONE) 200 MG tablet, Take 1 tablet (200 mg total) by mouth daily., Disp: 90 tablet, Rfl: 3   apixaban (ELIQUIS) 5 MG TABS tablet, Take 1 tablet (5 mg total) by mouth 2 (two) times daily., Disp: 180 tablet, Rfl: 3   aspirin 81 MG chewable tablet, Chew 1 tablet (81 mg total) by mouth daily., Disp: 30 tablet, Rfl: 11   atorvastatin (LIPITOR) 20 MG tablet, Take 1 tablet (20 mg total) by mouth daily., Disp: 90 tablet, Rfl: 3   carvedilol (COREG) 3.125 MG tablet, Take 1 tablet (3.125 mg total) by mouth 2 (two) times daily., Disp: 180 tablet, Rfl: 3   dapagliflozin propanediol (FARXIGA) 10 MG TABS tablet, Take 1 tablet (10 mg total) by mouth daily before breakfast., Disp: 90 tablet, Rfl: 3   Evolocumab (REPATHA SURECLICK) 140 MG/ML SOAJ, Inject 140mg  into the skin once every 14 days., Disp: 2 mL, Rfl: 3   ezetimibe (ZETIA) 10 MG tablet, Take 1 tablet (10 mg total) by mouth daily., Disp: 90 tablet, Rfl: 3   furosemide (LASIX) 40 MG tablet, Take 1 tablet (40 mg total) by mouth daily as needed. Take for shortness of breath or abdominal swelling., Disp: 90 tablet, Rfl: 3   levothyroxine (SYNTHROID) 137 MCG tablet, Take 1 tablet (137 mcg total) by mouth daily., Disp: 90 tablet, Rfl: 0   pantoprazole (PROTONIX) 40 MG tablet, Take 1 tablet (40 mg total) by mouth 2 (two) times daily., Disp: 60 tablet, Rfl: 3   potassium chloride SA (KLOR-CON M20) 20 MEQ tablet, Take 1 tablet (20 mEq total) by mouth daily as  needed. Take as needed with Lasix., Disp: 90 tablet, Rfl: 3   sildenafil (VIAGRA) 100 MG tablet, Take 1 tablet (100 mg total) by mouth as needed for erectile dysfunction. TAKE 1 (ONE) TABLET AS NEEDED. DO NOT USE AT SAME TIME AS NITRO, Disp: 30 tablet, Rfl: 3  Past Medical History: Past Medical History:  Diagnosis Date   Acute MI anterior wall first episode care (HCC) 07/13/2017   Occluded ostial LAD S/P 3.5x26 Onyx DES. RI 80%. RCA Occluded at prior stent site with collaterals    Cardiogenic shock (HCC) 07/13/2017   CHF (congestive heart failure) (HCC)    Graves disease    Hypertension    MI (myocardial infarction) (HCC)    Thyroid disease    thyroid irradiated per pt    Tobacco Use: Social History   Tobacco Use  Smoking Status Former   Current packs/day: 0.00   Average packs/day: 0.5 packs/day for 20.0 years (10.0 ttl pk-yrs)   Types: Cigarettes   Start date: 07/13/1997   Quit date: 07/13/2017   Years since quitting: 6.2  Smokeless Tobacco Never  Tobacco Comments   quit 2012    Labs: Review Flowsheet  More data exists      Latest Ref Rng & Units 07/26/2022 06/13/2023 06/22/2023 06/23/2023 06/25/2023  Labs for ITP Cardiac and Pulmonary Rehab  Cholestrol 0 - 200 mg/dL 74  71  - - -  LDL (calc) 0 - 99 mg/dL 24  18  - - -  HDL-C >62.13 mg/dL 39  08.65  - - -  Trlycerides 0.0 - 149.0 mg/dL 54  78.4  - - -  Hemoglobin A1c 4.8 - 5.6 % 6.3  6.5  - 6.4  -  PH, Arterial 7.35 - 7.45 - - 7.383  - 7.287  7.418  7.345  7.326  7.402  7.313   PCO2 arterial 32 - 48 mmHg - - 37.6  - 44.6  32.4  39.2  43.2  38.4  44.5   Bicarbonate 20.0 - 28.0 mmol/L - - 24.9  22.4  - 21.4  21.1  21.7  22.6  23.9  22.5   TCO2 22 - 32 mmol/L - - 26  24  - 23  22  23  23  24  26  25  25  24  29    Acid-base deficit 0.0 - 2.0 mmol/L - - 1.0  2.0  - 5.0  3.0  4.0  3.0  1.0  4.0   O2 Saturation % - - 71  99  - 99  98  96  97  100  100     Details       Multiple values from one day are sorted in  reverse-chronological order          Exercise Target Goals: Exercise Program Goal: Individual exercise prescription set using results from initial 6 min walk test and THRR while considering  patient's activity barriers and safety.   Exercise Prescription Goal: Initial exercise prescription builds to 30-45 minutes a day of aerobic activity, 2-3 days per week.  Home exercise guidelines will be given to patient during program as part of exercise prescription that the participant will acknowledge.   Education: Aerobic Exercise: - Group verbal and visual presentation on the components of exercise prescription. Introduces F.I.T.T principle from ACSM for exercise prescriptions.  Reviews F.I.T.T. principles of aerobic exercise including progression. Written material given at graduation.   Education: Resistance Exercise: - Group verbal and visual presentation on the components of exercise prescription. Introduces F.I.T.T principle from ACSM for exercise prescriptions  Reviews F.I.T.T. principles of resistance exercise including progression. Written material given at graduation.    Education: Exercise & Equipment Safety: - Individual verbal instruction and demonstration of equipment use and safety with use of the equipment. Flowsheet Row Cardiac Rehab from 08/22/2023 in [facility] Cardiac and Pulmonary Rehab  Date 08/22/23  Educator [facility]  Instruction Review Code 1- Verbalizes Understanding       Education: Exercise Physiology & General Exercise Guidelines: - Group verbal and written instruction with models to review the exercise physiology of the cardiovascular system and associated critical values. Provides general exercise guidelines with specific guidelines to those with heart or lung disease.  Flowsheet Row Cardiac Rehab from 11/12/2017 in [facility] Cardiac and Pulmonary Rehab  Date 10/22/17  Educator [facility]  Instruction Review Code 1- [employer] Understanding       Education: Flexibility,  Balance, Mind/Body Relaxation: - Group verbal and visual presentation with interactive activity on the components of exercise prescription. Introduces F.I.T.T principle from ACSM for exercise prescriptions. Reviews F.I.T.T. principles of flexibility and balance exercise training including progression. Also discusses the mind body connection.  Reviews various relaxation techniques to help reduce and manage stress (i.e. Deep breathing, progressive muscle relaxation, and visualization). Balance handout provided to take home. Written material given at graduation.   Activity Barriers & Risk Stratification:  Activity Barriers &  Cardiac Risk Stratification - 08/22/23 1558       Activity Barriers & Cardiac Risk Stratification   Activity Barriers Back Problems;Incisional Pain    Cardiac Risk Stratification High             6 Minute Walk:  6 Minute Walk     Row Name 08/22/23 1555         6 Minute Walk   Phase Initial     Distance 1330 feet     Walk Time 6 minutes     # of Rest Breaks 0     MPH 2.52     METS 3.76     RPE 9     Perceived Dyspnea  0     VO2 Peak 13.14     Symptoms No     Resting HR 74 bpm     Resting BP 112/72     Resting Oxygen Saturation  96 %     Exercise Oxygen Saturation  during 6 min walk 98 %     Max Ex. HR 97 bpm     Max Ex. BP 122/70     2 Minute Post BP 104/62              Oxygen Initial Assessment:   Oxygen Re-Evaluation:   Oxygen Discharge (Final Oxygen Re-Evaluation):   Initial Exercise Prescription:  Initial Exercise Prescription - 08/22/23 1500       Date of Initial Exercise RX and Referring Provider   Date 08/22/23    Referring Provider Gollan      Oxygen   Maintain Oxygen Saturation 88% or higher      Treadmill   MPH 3    Grade 1    Minutes 15    METs 3.71      NuStep   Level 2   T6   SPM 80    Minutes 15    METs 3.76      Elliptical   Level 1    Speed 3    Minutes 15    METs 3.76      Prescription Details    Frequency (times per week) 3    Duration Progress to 30 minutes of continuous aerobic without signs/symptoms of physical distress      Intensity   THRR 40-80% of Max Heartrate 111-148    Ratings of Perceived Exertion 11-13    Perceived Dyspnea 0-4      Progression   Progression Continue to progress workloads to maintain intensity without signs/symptoms of physical distress.      Resistance Training   Training Prescription Yes    Weight 5    Reps 10-15             Perform Capillary Blood Glucose checks as needed.  Exercise Prescription Changes:   Exercise Prescription Changes     Row Name 08/22/23 1500 09/11/23 1500 09/24/23 1500         Response to Exercise   Blood Pressure (Admit) 112/72 104/60 132/82     Blood Pressure (Exercise) 122/70 148/68 152/76     Blood Pressure (Exit) 104/62 98/58 102/60     Heart Rate (Admit) 74 bpm 72 bpm 72 bpm     Heart Rate (Exercise) 97 bpm 137 bpm 128 bpm     Heart Rate (Exit) 73 bpm 94 bpm 89 bpm     Oxygen Saturation (Admit) 96 % -- --     Oxygen Saturation (Exercise) 98 % -- --  Oxygen Saturation (Exit) 98 % -- --     Rating of Perceived Exertion (Exercise) 9 15 13      Perceived Dyspnea (Exercise) 0 0 --     Symptoms none none none     Comments 6 MWT results -- --     Duration -- Progress to 30 minutes of  aerobic without signs/symptoms of physical distress Continue with 30 min of aerobic exercise without signs/symptoms of physical distress.     Intensity -- THRR unchanged THRR unchanged       Progression   Progression -- Continue to progress workloads to maintain intensity without signs/symptoms of physical distress. Continue to progress workloads to maintain intensity without signs/symptoms of physical distress.     Average METs -- 4.22 3.91       Resistance Training   Training Prescription -- Yes Yes     Weight -- 5 5 lb     Reps -- 10-15 10-15       Interval Training   Interval Training -- No No       Treadmill    MPH -- 3 3     Grade -- 4 4     Minutes -- 15 15     METs -- 4.95 4.95       NuStep   Level -- 2  T6 4  T6 nustep     Minutes -- 15 15     METs -- 3.2 2.2       Elliptical   Level -- 2 5     Speed -- 3 4.5     Minutes -- 15 15     METs -- 5.4 --       Oxygen   Maintain Oxygen Saturation -- 88% or higher 88% or higher              Exercise Comments:   Exercise Comments     Row Name 08/29/23 1541           Exercise Comments First full day of exercise!  Patient was oriented to gym and equipment including functions, settings, policies, and procedures.  Patient's individual exercise prescription and treatment plan were reviewed.  All starting workloads were established based on the results of the 6 minute walk test done at initial orientation visit.  The plan for exercise progression was also introduced and progression will be customized based on patient's performance and goals.                Exercise Goals and Review:   Exercise Goals     Row Name 08/22/23 1602             Exercise Goals   Increase Physical Activity Yes       Intervention Provide advice, education, support and counseling about physical activity/exercise needs.;Develop an individualized exercise prescription for aerobic and resistive training based on initial evaluation findings, risk stratification, comorbidities and participant's personal goals.       Expected Outcomes Short Term: Attend rehab on a regular basis to increase amount of physical activity.;Long Term: Add in home exercise to make exercise part of routine and to increase amount of physical activity.;Long Term: Exercising regularly at least 3-5 days a week.       Increase Strength and Stamina Yes       Intervention Provide advice, education, support and counseling about physical activity/exercise needs.;Develop an individualized exercise prescription for aerobic and resistive training based on initial evaluation findings, risk  stratification, comorbidities and  participant's personal goals.       Expected Outcomes Short Term: Increase workloads from initial exercise prescription for resistance, speed, and METs.;Short Term: Perform resistance training exercises routinely during rehab and add in resistance training at home;Long Term: Improve cardiorespiratory fitness, muscular endurance and strength as measured by increased METs and functional capacity ( )       Able to understand and use rate of perceived exertion (RPE) scale Yes       Intervention Provide education and explanation on how to use RPE scale       Expected Outcomes Short Term: Able to use RPE daily in rehab to express subjective intensity level;Long Term:  Able to use RPE to guide intensity level when exercising independently       Able to understand and use Dyspnea scale Yes       Intervention Provide education and explanation on how to use Dyspnea scale       Expected Outcomes Short Term: Able to use Dyspnea scale daily in rehab to express subjective sense of shortness of breath during exertion;Long Term: Able to use Dyspnea scale to guide intensity level when exercising independently       Knowledge and understanding of Target Heart Rate Range (THRR) Yes       Intervention Provide education and explanation of THRR including how the numbers were predicted and where they are located for reference       Expected Outcomes Short Term: Able to state/look up THRR;Long Term: Able to use THRR to govern intensity when exercising independently;Short Term: Able to use daily as guideline for intensity in rehab       Able to check pulse independently Yes       Intervention Provide education and demonstration on how to check pulse in carotid and radial arteries.;Review the importance of being able to check your own pulse for safety during independent exercise       Expected Outcomes Short Term: Able to explain why pulse checking is important during independent  exercise;Long Term: Able to check pulse independently and accurately       Understanding of Exercise Prescription Yes       Intervention Provide education, explanation, and written materials on patient's individual exercise prescription       Expected Outcomes Short Term: Able to explain program exercise prescription;Long Term: Able to explain home exercise prescription to exercise independently                Exercise Goals Re-Evaluation :  Exercise Goals Re-Evaluation     Row Name 08/29/23 1546 09/11/23 1548 09/24/23 1522         Exercise Goal Re-Evaluation   Exercise Goals Review Increase Physical Activity;Able to understand and use rate of perceived exertion (RPE) scale;Knowledge and understanding of Target Heart Rate Range (THRR);Increase Strength and Stamina;Able to check pulse independently;Understanding of Exercise Prescription Increase Physical Activity;Increase Strength and Stamina;Understanding of Exercise Prescription Increase Physical Activity;Increase Strength and Stamina;Understanding of Exercise Prescription     Comments Reviewed RPE  and dyspnea scale, THR and program prescription with pt today.  Pt voiced understanding and was given a copy of goals to take home. Bobby [name] has completed his first 4 sessions during this review, and continues to get familiar with the program. He recently increased his grade on the treamill from 1% to 4%. He also increased his elliptical level from 1 to 2. We will continue to monitor his progress in the program. Bobby [name] continues to do well in rehab. He  has continued to walk the treadmill at a speed of 3 mph and an incline of 4%. He also improved to level 5 on the elliptical and level 4 on the T6 nustep. We will continue to monitor his progress in the program.     Expected Outcomes Short: Use RPE daily to regulate intensity.  Long: Follow program prescription in THR. Short: Continue to follow current exercise prescription, and progressively increase  workloads. Long: Continue exercise to improve strength and stamina. Short: Begin to progressively increase treadmill workload. Long: Continue exercise to improve strength and stamina.              Discharge Exercise Prescription (Final Exercise Prescription Changes):  Exercise Prescription Changes - 09/24/23 1500       Response to Exercise   Blood Pressure (Admit) 132/82    Blood Pressure (Exercise) 152/76    Blood Pressure (Exit) 102/60    Heart Rate (Admit) 72 bpm    Heart Rate (Exercise) 128 bpm    Heart Rate (Exit) 89 bpm    Rating of Perceived Exertion (Exercise) 13    Symptoms none    Duration Continue with 30 min of aerobic exercise without signs/symptoms of physical distress.    Intensity THRR unchanged      Progression   Progression Continue to progress workloads to maintain intensity without signs/symptoms of physical distress.    Average METs 3.91      Resistance Training   Training Prescription Yes    Weight 5 lb    Reps 10-15      Interval Training   Interval Training No      Treadmill   MPH 3    Grade 4    Minutes 15    METs 4.95      NuStep   Level 4   T6 nustep   Minutes 15    METs 2.2      Elliptical   Level 5    Speed 4.5    Minutes 15      Oxygen   Maintain Oxygen Saturation 88% or higher             Nutrition:  Target Goals: Understanding of nutrition guidelines, daily intake of sodium 1500mg , cholesterol 200mg , calories 30% from fat and 7% or less from saturated fats, daily to have 5 or more servings of fruits and vegetables.  Education: All About Nutrition: -Group instruction provided by verbal, written material, interactive activities, discussions, models, and posters to present general guidelines for heart healthy nutrition including fat, fiber, MyPlate, the role of sodium in heart healthy nutrition, utilization of the nutrition label, and utilization of this knowledge for meal planning. Follow up email sent as well. Written  material given at graduation. Flowsheet Row Cardiac Rehab from 11/12/2017 in [facility] Cardiac and Pulmonary Rehab  Date 10/08/17  Educator PI  Instruction Review Code 1- Verbalizes Understanding       Biometrics:  Pre Biometrics - 08/22/23 1606       Pre Biometrics   Height 6\' 1"  (1.854 m)    Weight 221 lb 12.8 oz (100.6 kg)    Waist Circumference 42 inches    Hip Circumference 41.5 inches    Waist to Hip Ratio 1.01 %    BMI (Calculated) 29.27    Single Leg Stand 30 seconds              Nutrition Therapy Plan and Nutrition Goals:  Nutrition Therapy & Goals - 08/22/23 1607  Intervention Plan   Intervention Prescribe, educate and counsel regarding individualized specific dietary modifications aiming towards targeted core components such as weight, hypertension, lipid management, diabetes, heart failure and other comorbidities.    Expected Outcomes Short Term Goal: Understand basic principles of dietary content, such as calories, fat, sodium, cholesterol and nutrients.;Short Term Goal: A plan has been developed with personal nutrition goals set during dietitian appointment.;Long Term Goal: Adherence to prescribed nutrition plan.             Nutrition Assessments:  MEDIFICTS Score Key: >=70 Need to make dietary changes  40-70 Heart Healthy Diet <= 40 Therapeutic Level Cholesterol Diet  Flowsheet Row Cardiac Rehab from 08/22/2023 in [facility] Cardiac and Pulmonary Rehab  Picture Your Plate Total Score on Admission 69      Picture Your Plate Scores: <04 Unhealthy dietary pattern with much room for improvement. 41-50 Dietary pattern unlikely to meet recommendations for good health and room for improvement. 51-60 More healthful dietary pattern, with some room for improvement.  >60 Healthy dietary pattern, although there may be some specific behaviors that could be improved.    Nutrition Goals Re-Evaluation:  Nutrition Goals Re-Evaluation     Row Name 09/06/23 1555              Goals   Current Weight 216 lb (98 kg)       Comment Bobby [name] defers dietitian appointment.                Nutrition Goals Discharge (Final Nutrition Goals Re-Evaluation):  Nutrition Goals Re-Evaluation - 09/06/23 1555       Goals   Current Weight 216 lb (98 kg)    Comment Bobby [name] defers dietitian appointment.             Psychosocial: Target Goals: Acknowledge presence or absence of significant depression and/or stress, maximize coping skills, provide positive support system. Participant is able to verbalize types and ability to use techniques and skills needed for reducing stress and depression.   Education: Stress, Anxiety, and Depression - Group verbal and visual presentation to define topics covered.  Reviews how body is impacted by stress, anxiety, and depression.  Also discusses healthy ways to reduce stress and to treat/manage anxiety and depression.  Written material given at graduation.   Education: Sleep Hygiene -Provides group verbal and written instruction about how sleep can affect your health.  Define sleep hygiene, discuss sleep cycles and impact of sleep habits. Review good sleep hygiene tips.    Initial Review & Psychosocial Screening:  Initial Psych Review & Screening - 08/07/23 1309       Initial Review   Current issues with Current Sleep Concerns      Family Dynamics   Good Support System? Yes   wife     Barriers   Psychosocial barriers to participate in program There are no identifiable barriers or psychosocial needs.;The patient should benefit from training in stress management and relaxation.      Screening Interventions   Expected Outcomes Short Term goal: Utilizing psychosocial counselor, staff and physician to assist with identification of specific Stressors or current issues interfering with healing process. Setting desired goal for each stressor or current issue identified.;Short Term goal: Identification and review with  participant of any Quality of Life or Depression concerns found by scoring the questionnaire.;Long Term Goal: Stressors or current issues are controlled or eliminated.;Long Term goal: The participant improves quality of Life and PHQ9 Scores as seen by post scores and/or verbalization of  changes             Quality of Life Scores:   Quality of Life - 08/22/23 1610       Quality of Life   Select Quality of Life      Quality of Life Scores   Health/Function Pre 16.27 %    Socioeconomic Pre 17.44 %    Psych/Spiritual Pre 16.93 %    Family Pre 17.3 %    GLOBAL Pre 16.81 %            Scores of 19 and below usually indicate a poorer quality of life in these areas.  A difference of  2-3 points is a clinically meaningful difference.  A difference of 2-3 points in the total score of the Quality of Life Index has been associated with significant improvement in overall quality of life, self-image, physical symptoms, and general health in studies assessing change in quality of life.  PHQ-9: Review Flowsheet  More data exists      09/06/2023 08/22/2023 06/20/2023 05/23/2023 10/15/2017  Depression screen PHQ 2/9  Decreased Interest 0 1 1 1 1   Down, Depressed, Hopeless 0 1 1 1  0  PHQ - 2 Score 0 2 2 2 1   Altered sleeping 1 2 1 2  0  Tired, decreased energy 1 1 1 3 1   Change in appetite 0 0 1 3 0  Feeling bad or failure about yourself  0 0 1 0 0  Trouble concentrating 2 2 1 3 1   Moving slowly or fidgety/restless 0 0 1 0 0  Suicidal thoughts 0 0 0 0 0  PHQ-9 Score 4 7 8 13 3   Difficult doing work/chores Somewhat difficult Somewhat difficult Somewhat difficult Somewhat difficult -    Details           Interpretation of Total Score  Total Score Depression Severity:  1-4 = Minimal depression, 5-9 = Mild depression, 10-14 = Moderate depression, 15-19 = Moderately severe depression, 20-27 = Severe depression   Psychosocial Evaluation and Intervention:  Psychosocial Evaluation -  08/07/23 1325       Psychosocial Evaluation & Interventions   Interventions Encouraged to exercise with the program and follow exercise prescription    Comments Bobby [name] is coming to 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 He did the program in 2018 after an MI. He states he doesn't feel stressed currenlty, just trying to be patient with the healing process. 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 His wife and dog are his main support system. He states his sleep has been horrible since surgery and sometimes he takes a Tylenol PM. He thinks it is slowly getting better, but he is up many times a night. His sternum incision did open and require debridement, he is almost done doing wet to dry dressing. He wants to work on his stamina and strength. He was supposed to participate in a [employer] race, but is unable to this year. His goal is to now be able to participate in this next year.    Expected Outcomes Short: attend cardiac rehab for education and exercise. Long: develop and maintain positive self care    Continue Psychosocial Services  Follow up required by staff             Psychosocial Re-Evaluation:  Psychosocial Re-Evaluation     Row Name 09/06/23 1557  Psychosocial Re-Evaluation   Current issues with Current Stress Concerns       Comments Reviewed patient health questionnaire (PHQ-9) with patient for follow up. Previously, patients score indicated signs/symptoms of depression.  Reviewed to see if patient is improving symptom wise while in program.  Score improved and patient states that it is because he has good support at home and his health has been getting better.       Expected Outcomes Short: Continue to attend [facility] regularly for regular exercise and social engagement. Long: Continue to improve symptoms and manage a positive mental state.       Interventions Encouraged to attend Cardiac Rehabilitation for the  exercise       Continue Psychosocial Services  Follow up required by staff                Psychosocial Discharge (Final Psychosocial Re-Evaluation):  Psychosocial Re-Evaluation - 09/06/23 1557       Psychosocial Re-Evaluation   Current issues with Current Stress Concerns    Comments Reviewed patient health questionnaire (PHQ-9) with patient for follow up. Previously, patients score indicated signs/symptoms of depression.  Reviewed to see if patient is improving symptom wise while in program.  Score improved and patient states that it is because he has good support at home and his health has been getting better.    Expected Outcomes Short: Continue to attend [facility] regularly for regular exercise and social engagement. Long: Continue to improve symptoms and manage a positive mental state.    Interventions Encouraged to attend Cardiac Rehabilitation for the exercise    Continue Psychosocial Services  Follow up required by staff             Vocational Rehabilitation: Provide vocational rehab assistance to qualifying candidates.   Vocational Rehab Evaluation & Intervention:  Vocational Rehab - 08/07/23 1308       Initial Vocational Rehab Evaluation & Intervention   Assessment shows need for Vocational Rehabilitation No             Education: Education Goals: Education classes will be provided on a variety of topics geared toward better understanding of heart health and risk factor modification. Participant will state understanding/return demonstration of topics presented as noted by education test scores.  Learning Barriers/Preferences:  Learning Barriers/Preferences - 08/07/23 1308       Learning Barriers/Preferences   Learning Barriers None    Learning Preferences Individual Instruction             General Cardiac Education Topics:  AED/CPR: - Group verbal and written instruction with the use of models to demonstrate the basic use of the AED with the  basic ABC's of resuscitation.   Anatomy and Cardiac Procedures: - Group verbal and visual presentation and models provide information about basic cardiac anatomy and function. Reviews the testing methods done to diagnose heart disease and the outcomes of the test results. Describes the treatment choices: Medical Management, Angioplasty, or Coronary Bypass Surgery for treating various heart conditions including Myocardial Infarction, Angina, Valve Disease, and Cardiac Arrhythmias.  Written material given at graduation. Flowsheet Row Cardiac Rehab from 08/22/2023 in [facility] Cardiac and Pulmonary Rehab  Education need identified 08/22/23       Medication Safety: - Group verbal and visual instruction to review commonly prescribed medications for heart and lung disease. Reviews the medication, class of the drug, and side effects. Includes the steps to properly store meds and maintain the prescription regimen.  Written material given at graduation.  Intimacy: - Group verbal instruction through game format to discuss how heart and lung disease can affect sexual intimacy. Written material given at graduation..   Know Your Numbers and Heart Failure: - Group verbal and visual instruction to discuss disease risk factors for cardiac and pulmonary disease and treatment options.  Reviews associated critical values for Overweight/Obesity, Hypertension, Cholesterol, and Diabetes.  Discusses basics of heart failure: signs/symptoms and treatments.  Introduces Heart Failure Zone chart for action plan for heart failure.  Written material given at graduation. Flowsheet Row Cardiac Rehab from 11/12/2017 in [facility] Cardiac and Pulmonary Rehab  Date 11/05/17  Educator [facility]  Instruction Review Code 1- Verbalizes Understanding       Infection Prevention: - Provides verbal and written material to individual with discussion of infection control including proper hand washing and proper equipment cleaning during exercise  session. Flowsheet Row Cardiac Rehab from 08/22/2023 in [facility] Cardiac and Pulmonary Rehab  Date 08/22/23  Educator [facility]  Instruction Review Code 1- Verbalizes Understanding       Falls Prevention: - Provides verbal and written material to individual with discussion of falls prevention and safety. Flowsheet Row Cardiac Rehab from 08/22/2023 in [facility] Cardiac and Pulmonary Rehab  Date 08/22/23  Educator [facility]  Instruction Review Code 1- Verbalizes Understanding       Other: -Provides group and verbal instruction on various topics (see comments) Flowsheet Row Cardiac Rehab from 11/12/2017 in [facility] Cardiac and Pulmonary Rehab  Date 10/31/17  [Risk Factors and Know Your Numbers]  Educator [facility]  Instruction Review Code 1- Verbalizes Understanding       Knowledge Questionnaire Score:  Knowledge Questionnaire Score - 08/22/23 1611       Knowledge Questionnaire Score   Pre Score 25/26             Core Components/Risk Factors/Patient Goals at Admission:  Personal Goals and Risk Factors at Admission - 08/22/23 1607       Core Components/Risk Factors/Patient Goals on Admission    Weight Management Yes;Weight Loss    Intervention Weight Management: Develop a combined nutrition and exercise program designed to reach desired caloric intake, while maintaining appropriate intake of nutrient and fiber, sodium and fats, and appropriate energy expenditure required for the weight goal.;Weight Management: Provide education and appropriate resources to help participant work on and attain dietary goals.;Weight Management/Obesity: Establish reasonable short term and long term weight goals.;Obesity: Provide education and appropriate resources to help participant work on and attain dietary goals.    Admit Weight 221 lb 12.8 oz (100.6 kg)    Goal Weight: Short Term 215 lb (97.5 kg)    Goal Weight: Long Term 210 lb (95.3 kg)    Expected Outcomes Short Term: Continue to assess and modify interventions until  short term weight is achieved;Long Term: Adherence to nutrition and physical activity/exercise program aimed toward attainment of established weight goal;Weight Loss: Understanding of general recommendations for a balanced deficit meal plan, which promotes 1-2 lb weight loss per week and includes a negative energy balance of [phone] kcal/d;Understanding recommendations for meals to include 15-35% energy as protein, 25-35% energy from fat, 35-60% energy from carbohydrates, less than 200mg  of dietary cholesterol, 20-35 gm of total fiber daily;Understanding of distribution of calorie intake throughout the day with the consumption of 4-5 meals/snacks    Hypertension Yes    Intervention Provide education on lifestyle modifcations including regular physical activity/exercise, weight management, moderate sodium restriction and increased consumption of fresh fruit, vegetables, and low fat dairy, alcohol moderation,  and smoking cessation.;Monitor prescription use compliance.    Expected Outcomes Short Term: Continued assessment and intervention until BP is < 140/91mm HG in hypertensive participants. < 130/28mm HG in hypertensive participants with diabetes, heart failure or chronic kidney disease.;Long Term: Maintenance of blood pressure at goal levels.    Lipids Yes    Intervention Provide education and support for participant on nutrition & aerobic/resistive exercise along with prescribed medications to achieve LDL 70mg , HDL >40mg .    Expected Outcomes Short Term: Participant states understanding of desired cholesterol values and is compliant with medications prescribed. Participant is following exercise prescription and nutrition guidelines.;Long Term: Cholesterol controlled with medications as prescribed, with individualized exercise RX and with personalized nutrition plan. Value goals: LDL < 70mg , HDL > 40 mg.             Education:Diabetes - Individual verbal and written instruction to review  signs/symptoms of diabetes, desired ranges of glucose level fasting, after meals and with exercise. Acknowledge that pre and post exercise glucose checks will be done for 3 sessions at entry of program.   Core Components/Risk Factors/Patient Goals Review:   Goals and Risk Factor Review     Row Name 09/06/23 1553             Core Components/Risk Factors/Patient Goals Review   Personal Goals Review Weight Management/Obesity;Other       Review Bobby [name] state that his weight has gone down. He has started taking Lasix and it is helping him breath better and get that weight off. He wants to reach a weight goal below 210 pounds.       Expected Outcomes Short: lose a few pounds in a couple weeks. Long: reach weight.                Core Components/Risk Factors/Patient Goals at Discharge (Final Review):   Goals and Risk Factor Review - 09/06/23 1553       Core Components/Risk Factors/Patient Goals Review   Personal Goals Review Weight Management/Obesity;Other    Review Bobby [name] state that his weight has gone down. He has started taking Lasix and it is helping him breath better and get that weight off. He wants to reach a weight goal below 210 pounds.    Expected Outcomes Short: lose a few pounds in a couple weeks. Long: reach weight.             ITP Comments:  ITP Comments     Row Name 08/07/23 1323 08/22/23 1555 08/29/23 1104 08/29/23 1540 09/26/23 1228   ITP Comments Initial phone call completed. Diagnosis can be found in [facility] 6/28. EP Orientation scheduled for Wednesday 8/28 at 2:30. Completed and gym orientation. Initial ITP created and sent for review to Dr. Bethann Punches, Medical Director. 30 Day review completed. Medical Director ITP review done, changes made as directed, and signed approval by Medical Director.     new to program First full day of exercise!  Patient was oriented to gym and equipment including functions, settings, policies, and procedures.  Patient's individual  exercise prescription and treatment plan were reviewed.  All starting workloads were established based on the results of the 6 minute walk test done at initial orientation visit.  The plan for exercise progression was also introduced and progression will be customized based on patient's performance and goals. 30 Day review completed. Medical Director ITP review done, changes made as directed, and signed approval by Medical Director.    new to program  Comments:

## 2023-09-28 ENCOUNTER — Other Ambulatory Visit: Payer: Self-pay

## 2023-10-03 ENCOUNTER — Telehealth: Payer: Self-pay | Admitting: Cardiovascular Disease

## 2023-10-03 ENCOUNTER — Encounter: Payer: Self-pay | Admitting: *Deleted

## 2023-10-03 NOTE — Telephone Encounter (Signed)
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

## 2023-10-03 NOTE — Telephone Encounter (Signed)
Patient c/o Palpitations:  STAT if patient reporting lightheadedness, shortness of breath, or chest pain  How long have you had palpitations/irregular HR/ Afib? Are you having the symptoms now? He in Afib at this time  Are you currently experiencing lightheadedness, SOB or CP? Heavy breathing, feels like heart is skipping no  Do you have a history of afib (atrial fibrillation) or irregular heart rhythm?   Have you checked your BP or HR? (document readings if available):   Are you experiencing any other symptoms? no

## 2023-10-03 NOTE — Progress Notes (Signed)
Bobby [name] called to tell us his Apple watch has shown several times that he was in atrial fib. He has been sick all week. He called to ask if he should come to CR. Advised him to call his doctor and review his illness, the Apple watch findings and see if he should be seen for further evaluation. Will cancel today's visit and see what his doctor says about continuing CR with afib.

## 2023-10-04 ENCOUNTER — Other Ambulatory Visit: Payer: Self-pay | Admitting: Family Medicine

## 2023-10-04 ENCOUNTER — Other Ambulatory Visit: Payer: Self-pay

## 2023-10-04 DIAGNOSIS — K219 Gastro-esophageal reflux disease without esophagitis: Secondary | ICD-10-CM

## 2023-10-04 MED ORDER — PANTOPRAZOLE SODIUM 40 MG PO TBEC
40.0000 mg | DELAYED_RELEASE_TABLET | Freq: Two times a day (BID) | ORAL | 3 refills | Status: DC
  Filled 2023-10-04: qty 60, 30d supply, fill #0
  Filled 2023-11-09: qty 60, 30d supply, fill #1
  Filled 2023-12-05: qty 60, 30d supply, fill #2
  Filled 2024-01-03: qty 60, 30d supply, fill #3

## 2023-10-04 MED ORDER — LEVOTHYROXINE SODIUM 137 MCG PO TABS
137.0000 ug | ORAL_TABLET | Freq: Every day | ORAL | 0 refills | Status: DC
  Filled 2023-10-04: qty 90, 90d supply, fill #0

## 2023-10-04 NOTE — Telephone Encounter (Signed)
Called patient and left a message for call back  

## 2023-10-05 ENCOUNTER — Other Ambulatory Visit: Payer: Self-pay

## 2023-10-08 ENCOUNTER — Encounter: Payer: 59 | Admitting: *Deleted

## 2023-10-08 DIAGNOSIS — Z48812 Encounter for surgical aftercare following surgery on the circulatory system: Secondary | ICD-10-CM | POA: Diagnosis not present

## 2023-10-08 DIAGNOSIS — Z951 Presence of aortocoronary bypass graft: Secondary | ICD-10-CM | POA: Diagnosis not present

## 2023-10-08 NOTE — Progress Notes (Signed)
Daily Session Note  Patient Details  Name: Bobby [name] MRN: [id number] Date of Birth: 11/28/1971 Referring Provider:   Flowsheet Row Cardiac Rehab from 08/22/2023 in [facility] Cardiac and Pulmonary Rehab  Referring Provider Gollan       Encounter Date: 10/08/2023  Check In:  Session Check In - 10/08/23 1545       Check-In   Supervising physician immediately available to respond to emergencies See telemetry face sheet for immediately available ER MD    Location [facility]-Cardiac & Pulmonary Rehab    Staff Present Maxon Conetta BS, , Exercise Physiologist;Megan Katrinka Blazing, RN, ADN;Zymir Napoli Jewel Baize, RN BSN;Joseph Hood, RCP,RRT,BSRT    Virtual Visit No    Medication changes reported     No    Fall or balance concerns reported    No    Warm-up and Cool-down Performed on first and last piece of equipment    Resistance Training Performed Yes    VAD Patient? No    PAD/SET Patient? No      Pain Assessment   Currently in Pain? No/denies                Social History   Tobacco Use  Smoking Status Former   Current packs/day: 0.00   Average packs/day: 0.5 packs/day for 20.0 years (10.0 ttl pk-yrs)   Types: Cigarettes   Start date: 07/13/1997   Quit date: 07/13/2017   Years since quitting: 6.2  Smokeless Tobacco Never  Tobacco Comments   quit 2012    Goals Met:  Independence with exercise equipment Exercise tolerated well No report of concerns or symptoms today Strength training completed today  Goals Unmet:  Not Applicable  Comments: Pt able to follow exercise prescription today without complaint.  Will continue to monitor for progression.    Dr. Bethann Punches is Medical Director for [facility] Cardiac Rehabilitation.  Dr. Vida Rigger is Medical Director for [facility] Pulmonary Rehabilitation.

## 2023-10-09 NOTE — Telephone Encounter (Signed)
Called patient and left message for call back.

## 2023-10-10 ENCOUNTER — Encounter: Payer: 59 | Admitting: *Deleted

## 2023-10-10 DIAGNOSIS — Z48812 Encounter for surgical aftercare following surgery on the circulatory system: Secondary | ICD-10-CM | POA: Diagnosis not present

## 2023-10-10 DIAGNOSIS — Z951 Presence of aortocoronary bypass graft: Secondary | ICD-10-CM

## 2023-10-10 NOTE — Progress Notes (Signed)
Daily Session Note  Patient Details  Name: Bobby [name] MRN: [id number] Date of Birth: [birth year] Referring Provider:   Flowsheet Row Cardiac Rehab from 08/22/2023 in [facility] Cardiac and Pulmonary Rehab  Referring Provider Gollan       Encounter Date: 10/10/2023  Check In:  Session Check In - 10/10/23 1540       Check-In   Supervising physician immediately available to respond to emergencies See telemetry face sheet for immediately available ER MD    Location [facility]-Cardiac & Pulmonary Rehab    Staff Present Susann Givens, RN BSN;Susanne Bice, RN, BSN, CCRP;Megan Katrinka Blazing, RN, Bobetta Lime, RCP,RRT,BSRT    Virtual Visit No    Medication changes reported     No    Fall or balance concerns reported    No    Warm-up and Cool-down Performed on first and last piece of equipment    Resistance Training Performed Yes    VAD Patient? No    PAD/SET Patient? No      Pain Assessment   Currently in Pain? No/denies                Social History   Tobacco Use  Smoking Status Former   Current packs/day: 0.00   Average packs/day: 0.5 packs/day for 20.0 years (10.0 ttl pk-yrs)   Types: Cigarettes   Start date: 07/13/1997   Quit date: 07/13/2017   Years since quitting: 6.2  Smokeless Tobacco Never  Tobacco Comments   quit 2012    Goals Met:  Independence with exercise equipment Exercise tolerated well No report of concerns or symptoms today Strength training completed today  Goals Unmet:  Not Applicable  Comments: Pt able to follow exercise prescription today without complaint.  Will continue to monitor for progression.    Dr. Bethann Punches is Medical Director for [facility] Cardiac Rehabilitation.  Dr. Vida Rigger is Medical Director for [facility] Pulmonary Rehabilitation.

## 2023-10-11 ENCOUNTER — Encounter: Payer: 59 | Admitting: *Deleted

## 2023-10-11 DIAGNOSIS — Z951 Presence of aortocoronary bypass graft: Secondary | ICD-10-CM

## 2023-10-11 DIAGNOSIS — Z48812 Encounter for surgical aftercare following surgery on the circulatory system: Secondary | ICD-10-CM | POA: Diagnosis not present

## 2023-10-11 NOTE — Progress Notes (Signed)
Daily Session Note  Patient Details  Name: Bobby [name] MRN: [id number] Date of Birth: [birth year] Referring Provider:   Flowsheet Row Cardiac Rehab from 08/22/2023 in [facility] Cardiac and Pulmonary Rehab  Referring Provider Gollan       Encounter Date: 10/11/2023  Check In:  Session Check In - 10/11/23 1527       Check-In   Supervising physician immediately available to respond to emergencies See telemetry face sheet for immediately available ER MD    Location [facility]-Cardiac & Pulmonary Rehab    Staff Present Susann Givens, RN BSN;Joseph Reino Kent, RCP,RRT,BSRT;Jamarii Banks Katrinka Blazing, RN, [place]    Virtual Visit No    Medication changes reported     No    Fall or balance concerns reported    No    Warm-up and Cool-down Performed on first and last piece of equipment    Resistance Training Performed Yes    VAD Patient? No    PAD/SET Patient? No      Pain Assessment   Currently in Pain? No/denies                Social History   Tobacco Use  Smoking Status Former   Current packs/day: 0.00   Average packs/day: 0.5 packs/day for 20.0 years (10.0 ttl pk-yrs)   Types: Cigarettes   Start date: 07/13/1997   Quit date: 07/13/2017   Years since quitting: 6.2  Smokeless Tobacco Never  Tobacco Comments   quit 2012    Goals Met:  Independence with exercise equipment Exercise tolerated well No report of concerns or symptoms today Strength training completed today  Goals Unmet:  Not Applicable  Comments: Pt able to follow exercise prescription today without complaint.  Will continue to monitor for progression.    Dr. Bethann Punches is Medical Director for [facility] Cardiac Rehabilitation.  Dr. Vida Rigger is Medical Director for [facility] Pulmonary Rehabilitation.

## 2023-10-11 NOTE — Telephone Encounter (Signed)
Called patient and left message for call back.

## 2023-10-15 ENCOUNTER — Encounter: Payer: 59 | Admitting: *Deleted

## 2023-10-15 DIAGNOSIS — Z951 Presence of aortocoronary bypass graft: Secondary | ICD-10-CM

## 2023-10-15 DIAGNOSIS — Z48812 Encounter for surgical aftercare following surgery on the circulatory system: Secondary | ICD-10-CM | POA: Diagnosis not present

## 2023-10-15 NOTE — Progress Notes (Signed)
Daily Session Note  Patient Details  Name: Bobby [name] MRN: [id number] Date of Birth: 09-28-1971 Referring Provider:   Flowsheet Row Cardiac Rehab from 08/22/2023 in [facility] Cardiac and Pulmonary Rehab  Referring Provider Gollan       Encounter Date: 10/15/2023  Check In:  Session Check In - 10/15/23 1531       Check-In   Supervising physician immediately available to respond to emergencies See telemetry face sheet for immediately available ER MD    Location [facility]-Cardiac & Pulmonary Rehab    Staff Present Maxon Suzzette Righter, , Exercise Physiologist;[place] Lap[place] Jewel Baize, RN BSN;Joseph Reino Kent, [place]    Virtual Visit No    Medication changes reported     No    Fall or balance concerns reported    No    Warm-up and Cool-down Performed on first and last piece of equipment    Resistance Training Performed Yes    VAD Patient? No    PAD/SET Patient? No      Pain Assessment   Currently in Pain? No/denies                Social History   Tobacco Use  Smoking Status Former   Current packs/day: 0.00   Average packs/day: 0.5 packs/day for 20.0 years (10.0 ttl pk-yrs)   Types: Cigarettes   Start date: 07/13/1997   Quit date: 07/13/2017   Years since quitting: 6.2  Smokeless Tobacco Never  Tobacco Comments   quit 2012    Goals Met:  Independence with exercise equipment Exercise tolerated well No report of concerns or symptoms today Strength training completed today  Goals Unmet:  Not Applicable  Comments: Pt able to follow exercise prescription today without complaint.  Will continue to monitor for progression.    Dr. Bethann Punches is Medical Director for [facility] Cardiac Rehabilitation.  Dr. Vida Rigger is Medical Director for [facility] Pulmonary Rehabilitation.

## 2023-10-18 ENCOUNTER — Telehealth: Payer: Self-pay

## 2023-10-18 ENCOUNTER — Encounter: Payer: 59 | Admitting: *Deleted

## 2023-10-18 DIAGNOSIS — Z951 Presence of aortocoronary bypass graft: Secondary | ICD-10-CM

## 2023-10-18 DIAGNOSIS — Z48812 Encounter for surgical aftercare following surgery on the circulatory system: Secondary | ICD-10-CM | POA: Diagnosis not present

## 2023-10-18 NOTE — Progress Notes (Signed)
Daily Session Note  Patient Details  Name: Bobby [name] MRN: [id number] Date of Birth: 07-10-1971 Referring Provider:   Flowsheet Row Cardiac Rehab from 08/22/2023 in [facility] Cardiac and Pulmonary Rehab  Referring Provider Gollan       Encounter Date: 10/18/2023  Check In:  Session Check In - 10/18/23 1527       Check-In   Supervising physician immediately available to respond to emergencies See telemetry face sheet for immediately available ER MD    Location [facility]-Cardiac & Pulmonary Rehab    Staff Present Lanny Hurst, RN, ADN;Joseph Reino Kent, RCP,RRT,BSRT;Wakisha Alberts Jewel Baize, RN BSN    Virtual Visit No    Medication changes reported     No    Fall or balance concerns reported    No    Warm-up and Cool-down Performed on first and last piece of equipment    Resistance Training Performed Yes    VAD Patient? No    PAD/SET Patient? No      Pain Assessment   Currently in Pain? No/denies                Social History   Tobacco Use  Smoking Status Former   Current packs/day: 0.00   Average packs/day: 0.5 packs/day for 20.0 years (10.0 ttl pk-yrs)   Types: Cigarettes   Start date: 07/13/1997   Quit date: 07/13/2017   Years since quitting: 6.2  Smokeless Tobacco Never  Tobacco Comments   quit 2012    Goals Met:  Independence with exercise equipment Exercise tolerated well Strength training completed today  Goals Unmet:  Not Applicable  Comments: Pt able to follow exercise prescription today without complaint.  Will continue to monitor for progression.    Dr. Bethann Punches is Medical Director for [facility] Cardiac Rehabilitation.  Dr. Vida Rigger is Medical Director for [facility] Pulmonary Rehabilitation.

## 2023-10-18 NOTE — Progress Notes (Addendum)
Cardiology Clinic Note   Date: 10/19/2023 ID: Bobby [name], DOB 04/24/1971, MRN [id number]  Primary Cardiologist:  Julien Nordmann, MD  Patient Profile    Bobby [name] is a 52 y.o. male who presents to the clinic today for evaluation of shortness of breath and palpitations.     Past medical history significant for: CAD. PCI with stents x 2 to RCA 2003. LHC 07/13/2017 (STEMI): Ostial LAD 100% occlusion.  Mid ramus 70 to 80%.  Mild disease in LCx.  RCA occluded at prior angioplasty site with contralateral collaterals from LAD.  PCI with thrombectomy, angioplasty, DES 3.5 x 26 mm to ostial LAD.  Impella placed secondary to cardiogenic shock. LHC 08/21/2017 (angina): LM normal.  Ostial LAD with patent stent.  Ostial diagonal 20%.  Proximal ramus 50%.  Known RCA CTO. LHC 08/10/2020 (abnormal stress test): LM normal.  Ostial LAD with patent stent.  Ostial diagonal 20%.  OM1 20%.  RCA in-stent CTO with left to left collaterals filling up to mid RCA.  Normal LVEDP. R/LHC 06/22/2023 (unstable angina): Ostial LCx 90% (ostium appears jailed by proximal LAD stent extending back into the distal LMCA).  Ostial LAD with patent stent.  RCA in-stent CTO.  Severely reduced LV systolic function 25 to 35%.  Upper normal to mildly elevated left and right heart filling pressures LVEDP 16 mmHg, PCWP 16 mmHg, mean RA 7.  Low normal to mildly reduced cardiac output/index.  Angiographically normal abdominal aorta, iliac arteries, and right common femoral artery.  Recommend transfer to [name] for CT surgery consult. CABG x 1 06/25/2023: LIMA to OM1. Chronic systolic heart failure/ischemic cardiomyopathy. Echo 06/22/2023: EF 25 to 30%.  Akinesis of entire anterior wall and apical segment.  Mild LVH.  Grade I DD.  Mildly reduced RV function.  Mild RVH. PAF. Onset June 2024. Hypertension. Hyperlipidemia. Lipid panel 06/13/2023: LDL 18, HDL 41, TG 59, total 71. GERD. Hypothyroidism. OSA. On CPAP. Former tobacco  abuse.  In summary, patient with extensive cardiac history.  He was previously followed by Dr. Rosemary Holms.  He underwent stenting x 2 to RCA in 2003 (no records available).  In July 2018 he presented with an anterior STEMI and was taken emergently to the the Cath Lab.  LHC revealed in-stent CTO of RCA, ostial LAD 100%, diagonal 80%, OM 70% with left-to-right collaterals.  He underwent PCI with DES to ostial LAD.  Impella was placed secondary to cardiogenic shock.  Echo showed EF 30 to 35%.  Relook LHC August 2018 showed patent stent.  LHC August 2021 secondary to abnormal stress test was essentially unchanged from previous.  Echo July 2021 showed EF 40 to 45%.  Stress test August 2023 showed abnormal perfusion with fixed defect, EF 41%, intermediate risk.  Patient presented to the ED on 06/15/2023 with chest pain and found to have new onset A-fib.  He was started on Eliquis and spontaneously converted in the ER.  Troponin 97.  He was seen in the office by Dr. Mariah Milling for follow-up and continued to complain of chest pain.  Outpatient heart catheterization was scheduled.  Echo showed EF 25 to 30%.  LHC showed significant two-vessel CAD (details above) and patient was transferred to [facility] for CTS evaluation.  He underwent CABG x 1 on 06/25/2023.     History of Present Illness    Bobby [name] is followed by Dr. Mariah Milling for the above outlined history.    He was evaluated in the ED for shortness of breath on 08/28/2023. Troponin  19>> 18.  BNP 455.  CTA negative for PE.  Cardiology was consulted and recommended not starting diuretics given no pulmonary edema or clinical volume overload.  He was discharged.  He was last seen in the office by Dr. Mariah Milling on 09/03/2023 with complaints of orthopnea.  Given his symptoms and elevated BNP and the ED he was started on Lasix and potassium.  He was maintaining sinus rhythm at that time.  Spironolactone could not be added secondary to low BP.  Office was contacted by cardiac  rehab RN on 10/18/2023 reporting patient with increased shortness of breath.  Per triage RN: "I received a call from the Cardiac Rehab nurse stating that the patient is reporting symptoms of shortness of breath (SOB), dizziness in the morning, and a 3-pound weight gain overnight. The nurse denied any swelling but mentioned that the patient is requiring PRN Lasix more frequently. The patient also complained of SOB with exertion and at rest; however, the nurse noted that the patient was able to complete the rehab session today without any signs of distress. Additionally, an EKG obtained today revealed PVCs every other beat. An appointment is scheduled for tomorrow, October 19, 2023, for further evaluation. The nurse advised following emergency department precautions should any new symptoms develop or worsen."  Discussed the use of AI scribe software for clinical note transcription with the patient, who gave verbal consent to proceed.  The patient is accompanied by his wife. He has a significant cardiac history including stent placements, heart catheterizations, and a one-vessel bypass, and presents with palpitations and shortness of breath. These symptoms have been ongoing for approximately two weeks and are particularly bothersome at night, disrupting the patient's sleep. The patient describes the palpitations as feeling like heart is "skipping beats" and these episodes are often accompanied by shortness of breath. The patient reports intermittent dizziness with standing. The patient has been managing fluid retention with daily Lasix and potassium, adjusting the dosage as needed based on weight gain. He denies chest pain.        ROS: All other systems reviewed and are otherwise negative except as noted in History of Present Illness.  Studies Reviewed    EKG Interpretation Date/Time:  Friday October 19 2023 14:50:52 EDT Ventricular Rate:  79 PR Interval:  180 QRS Duration:  104 QT  Interval:  416 QTC Calculation: 477 R Axis:   -46  Text Interpretation: Sinus rhythm with occasional Premature ventricular complexes Left axis deviation Minimal voltage criteria for LVH, may be normal variant ( Cornell product ) When compared with ECG of 03-Sep-2023 15:56, Premature ventricular complexes are now Present Confirmed by Carlos Levering [phone]) on 10/19/2023 3:01:58 PM   Risk Assessment/Calculations     CHA2DS2-VASc Score = 3   This indicates a 3.2% annual risk of stroke. The patient's score is based upon: CHF History: 1 HTN History: 1 Diabetes History: 0 Stroke History: 0 Vascular Disease History: 1 Age Score: 0 Gender Score: 0             Physical Exam    VS:  BP 98/70 (BP Location: Left Arm, Patient Position: Sitting, Cuff Size: Normal)   Pulse 79   Ht 6\' 1"  (1.854 m)   Wt 217 lb 6 oz (98.6 kg)   SpO2 96%   BMI 28.68 kg/m  , BMI Body mass index is 28.68 kg/m.  Orthostatic VS for the past 24 hrs (Last 3 readings):  BP- Lying Pulse- Lying BP- Sitting Pulse- Sitting BP- Standing at  0 minutes Pulse- Standing at 0 minutes BP- Standing at 3 minutes Pulse- Standing at 3 minutes  10/19/23 1453 106/69 67 113/64 73 113/71 72 106/75 68    GEN: Well nourished, well developed, in no acute distress. Neck: No JVD or carotid bruits. Cardiac:  RRR. Occasional extrasystole. No murmurs. No rubs or gallops.   Respiratory:  Respirations regular and unlabored. Clear to auscultation without rales, wheezing or rhonchi. GI: Soft, nontender, nondistended. Extremities: Radials/DP/PT 2+ and equal bilaterally. No clubbing or cyanosis. No edema.  Skin: Warm and dry, no rash. Neuro: Strength intact.  Assessment & Plan      Premature Ventricular Contractions (PVCs)/Shortness of breath New onset of palpitations, shortness of breath, and fatigue. EKG shows PVCs. Discussed  potential for increased PVCs post-bypass surgery and provided reassurance. Shortness of breath always  associated with palpitations.  -Order Zio heart monitor for 7 days to assess PVC burden. -Stop Carvedilol and start Metoprolol 25mg  at night. -Check blood pressure at home and monitor for changes.  -CBC, TSH, Mg today.   CAD Extensive history with multiple PCI.  Most recently, CABG x 25 June 2023.  Patient denies chest pain, tightness of pressure. He has been participating in cardiac rehab with good tolerance.  -Continue aspirin, Eliquis, Repatha, atorvastatin, Zetia. Changing to Toprol.   Chronic systolic heart failure/ischemic cardiomyopathy Echo June 2024 showed EF 25 to 30%.  Patient on daily Lasix 20mg  and potassium supplement. Reports maintaining stable weight with this regimen. Euvolemic and well compensated on exam. -Continue Lasix, Farxiga. Changing to Toprol.  -BMP today.  Cardiac Rehabilitation Patient with 2 week history of palpitations and shortness of breath. PVCs seen during rehab one day ago.  -Advise a temporary break from cardiac rehab for 2 weeks to see if Metoprolol suppresses palpitations.  -Resume light walks and gradually return to rehab if Metoprolol is effective.  PAF Onset June 2024.  Patient reports his Apple watch has alerted for Afib before but lately it has said indeterminate rhythm. He is having palpitations and shortness of breath daily.  Denies spontaneous bleeding concerns. -Continue amiodarone and Eliquis. Changing to Toprol.   Hypertension BP today 98/70. Reports positional dizziness. No orthostasis today.  -Changing to Toprol.  Hyperlipidemia LDL June 2024 18, at goal.   -Continue atorvastatin, Zetia, Repatha.     Disposition: Stop carvedilol and begin Toprol 25 mg in the evening. 7 day Zio. CBC, BMP, TSH, Mg. Return in 6 weeks or sooner as needed.          Signed, Etta Grandchild. Sherral Dirocco, DNP, NP-C

## 2023-10-18 NOTE — Telephone Encounter (Signed)
I received a call from the Cardiac Rehab nurse stating that the patient is reporting symptoms of shortness of breath (SOB), dizziness in the morning, and a 3-pound weight gain overnight. The nurse denied any swelling but mentioned that the patient is requiring PRN Lasix more frequently. The patient also complained of SOB with exertion and at rest; however, the nurse noted that the patient was able to complete the rehab session today without any signs of distress.  Additionally, an EKG obtained today revealed PVCs every other beat. An appointment is scheduled for tomorrow, October 19, 2023, for further evaluation. The nurse advised following emergency department precautions should any new symptoms develop or worsen

## 2023-10-19 ENCOUNTER — Ambulatory Visit: Payer: 59 | Attending: Student | Admitting: Student

## 2023-10-19 ENCOUNTER — Encounter: Payer: Self-pay | Admitting: Student

## 2023-10-19 ENCOUNTER — Other Ambulatory Visit: Payer: Self-pay

## 2023-10-19 ENCOUNTER — Ambulatory Visit: Payer: 59

## 2023-10-19 VITALS — BP 98/70 | HR 79 | Ht 73.0 in | Wt 217.4 lb

## 2023-10-19 DIAGNOSIS — I10 Essential (primary) hypertension: Secondary | ICD-10-CM

## 2023-10-19 DIAGNOSIS — I255 Ischemic cardiomyopathy: Secondary | ICD-10-CM

## 2023-10-19 DIAGNOSIS — I25118 Atherosclerotic heart disease of native coronary artery with other forms of angina pectoris: Secondary | ICD-10-CM

## 2023-10-19 DIAGNOSIS — E785 Hyperlipidemia, unspecified: Secondary | ICD-10-CM | POA: Diagnosis not present

## 2023-10-19 DIAGNOSIS — R002 Palpitations: Secondary | ICD-10-CM

## 2023-10-19 DIAGNOSIS — I480 Paroxysmal atrial fibrillation: Secondary | ICD-10-CM | POA: Diagnosis not present

## 2023-10-19 DIAGNOSIS — I493 Ventricular premature depolarization: Secondary | ICD-10-CM

## 2023-10-19 DIAGNOSIS — R0602 Shortness of breath: Secondary | ICD-10-CM

## 2023-10-19 MED ORDER — METOPROLOL SUCCINATE ER 25 MG PO TB24
25.0000 mg | ORAL_TABLET | Freq: Every day | ORAL | 0 refills | Status: DC
  Filled 2023-10-19: qty 90, 90d supply, fill #0

## 2023-10-19 NOTE — Patient Instructions (Addendum)
Medication Instructions:  STOP the Carvedilol  START Metoprolol (Toprol) 25 mg once daily  *If you need a refill on your cardiac medications before your next appointment, please call your pharmacy*   Lab Work: Your provider would like for you to have following labs drawn today CBC, BMET, TSH, Magnesium . If you have labs (blood work) drawn today and your tests are completely normal, you will receive your results only by: MyChart Message (if you have MyChart) OR A paper copy in the mail If you have any lab test that is abnormal or we need to change your treatment, we will call you to review the results.   Testing/Procedures: None ordered   Follow-Up: At [facility], you and your health needs are our priority.  As part of our continuing mission to provide you with exceptional heart care, we have created designated Provider Care Teams.  These Care Teams include your primary Cardiologist (physician) and Advanced Practice Providers (APPs -  Physician Assistants and Nurse Practitioners) who all work together to provide you with the care you need, when you need it.  We recommend signing up for the patient portal called "MyChart".  Sign up information is provided on this After Visit Summary.  MyChart is used to connect with patients for Virtual Visits (Telemedicine).  Patients are able to view lab/test results, encounter notes, upcoming appointments, etc.  Non-urgent messages can be sent to your provider as well.   To learn more about what you can do with MyChart, go to [email or web address].    Your next appointment:   6 week(s)  Provider:   Carlos [name],. NP  ZIO XT- Long Term Monitor Instructions  Your physician has requested you wear a ZIO patch monitor for 7 days.  This is a single patch monitor. [employer] supplies one patch monitor per enrollment. Additional stickers are not available. Please do not apply patch if you will be having a Nuclear Stress Test,   Echocardiogram, Cardiac CT, MRI, or Chest Xray during the period you would be wearing the  monitor. The patch cannot be worn during these tests. You cannot remove and re-apply the  ZIO XT patch monitor.  Your ZIO patch monitor will be mailed 3 day USPS to your address on file. It may take 3-5 days  to receive your monitor after you have been enrolled.  Once you have received your monitor, please review the enclosed instructions. Your monitor  has already been registered assigning a specific monitor serial # to you.  Billing and Patient Assistance Program Information  We have supplied [employer] with any of your insurance information on file for billing purposes. [employer] offers a sliding scale Patient Assistance Program for patients that do not have  insurance, or whose insurance does not completely cover the cost of the ZIO monitor.  You must apply for the Patient Assistance Program to qualify for this discounted rate.  To apply, please call [employer] at [phone], select option 4, select option 2, ask to apply for  Patient Assistance Program. Meredeth Ide will ask your household income, and how many people  are in your household. They will quote your out-of-pocket cost based on that information.  [employer] will also be able to set up a 27-month, interest-free payment plan if needed.  Applying the monitor   Shave hair from upper left chest.  Hold abrader disc by orange tab. Rub abrader in 40 strokes over the upper left chest as  indicated in your monitor instructions.  Clean area with 4  enclosed alcohol pads. Let dry.  Apply patch as indicated in monitor instructions. Patch will be placed under collarbone on left  side of chest with arrow pointing upward.  Rub patch adhesive wings for 2 minutes. Remove white label marked "1". Remove the white  label marked "2". Rub patch adhesive wings for 2 additional minutes.  While looking in a mirror, press and release button in center of patch. A small  green light will  flash 3-4 times. This will be your only indicator that the monitor has been turned on.  Do not shower for the first 24 hours. You may shower after the first 24 hours.  Press the button if you feel a symptom. You will hear a small click. Record Date, Time and  Symptom in the Patient Logbook.  When you are ready to remove the patch, follow instructions on the last 2 pages of Patient  Logbook. Stick patch monitor onto the last page of Patient Logbook.  Place Patient Logbook in the blue and white box. Use locking tab on box and tape box closed  securely. The blue and white box has prepaid postage on it. Please place it in the mailbox as  soon as possible. Your physician should have your test results approximately 7 days after the  monitor has been mailed back to [facility].  Call [facility] Customer Care at [phone] if you have questions regarding  your ZIO XT patch monitor. Call them immediately if you see an orange light blinking on your  monitor.  If your monitor falls off in less than 4 days, contact our Monitor department at [phone].  If your monitor becomes loose or falls off after 4 days call [employer] at [phone] for  suggestions on securing your monitor

## 2023-10-20 LAB — CBC
Hematocrit: 42.8 % (ref 37.5–51.0)
Hemoglobin: 13.4 g/dL (ref 13.0–17.7)
MCH: 28.7 pg (ref 26.6–33.0)
MCHC: 31.3 g/dL — ABNORMAL LOW (ref 31.5–35.7)
MCV: 92 fL (ref 79–97)
Platelets: 195 10*3/uL (ref 150–450)
RBC: 4.67 x10E6/uL (ref 4.14–5.80)
RDW: 13.7 % (ref 11.6–15.4)
WBC: 7.4 10*3/uL (ref 3.4–10.8)

## 2023-10-20 LAB — BASIC METABOLIC PANEL
BUN/Creatinine Ratio: 14 (ref 9–20)
BUN: 21 mg/dL (ref 6–24)
CO2: 23 mmol/L (ref 20–29)
Calcium: 9.4 mg/dL (ref 8.7–10.2)
Chloride: 106 mmol/L (ref 96–106)
Creatinine, Ser: 1.45 mg/dL — ABNORMAL HIGH (ref 0.76–1.27)
Glucose: 104 mg/dL — ABNORMAL HIGH (ref 70–99)
Potassium: 4.6 mmol/L (ref 3.5–5.2)
Sodium: 141 mmol/L (ref 134–144)
eGFR: 58 mL/min/{1.73_m2} — ABNORMAL LOW (ref >59)

## 2023-10-20 LAB — TSH: TSH: 4.42 u[IU]/mL (ref 0.450–4.500)

## 2023-10-20 LAB — MAGNESIUM: Magnesium: 2.4 mg/dL — ABNORMAL HIGH (ref 1.6–2.3)

## 2023-10-21 ENCOUNTER — Other Ambulatory Visit: Payer: Self-pay

## 2023-10-22 ENCOUNTER — Other Ambulatory Visit (HOSPITAL_BASED_OUTPATIENT_CLINIC_OR_DEPARTMENT_OTHER): Payer: Self-pay

## 2023-10-22 DIAGNOSIS — R002 Palpitations: Secondary | ICD-10-CM | POA: Diagnosis not present

## 2023-10-24 ENCOUNTER — Other Ambulatory Visit: Payer: Self-pay | Admitting: *Deleted

## 2023-10-24 ENCOUNTER — Encounter: Payer: Self-pay | Admitting: *Deleted

## 2023-10-24 DIAGNOSIS — Z951 Presence of aortocoronary bypass graft: Secondary | ICD-10-CM

## 2023-10-24 NOTE — Progress Notes (Signed)
Cardiac Individual Treatment Plan  Patient Details  Name: Bobby [name] MRN: [id number] Date of Birth: 02/07/1971 Referring Provider:   Flowsheet Row Cardiac Rehab from 08/22/2023 in [facility] Cardiac and Pulmonary Rehab  Referring Provider Gollan       Initial Encounter Date:  Flowsheet Row Cardiac Rehab from 08/22/2023 in [facility] Cardiac and Pulmonary Rehab  Date 08/22/23       Visit Diagnosis: S/P CABG x 1  Patient's Home Medications on Admission:  Current Outpatient Medications:    amiodarone (PACERONE) 200 MG tablet, Take 1 tablet (200 mg total) by mouth daily., Disp: 90 tablet, Rfl: 3   apixaban (ELIQUIS) 5 MG TABS tablet, Take 1 tablet (5 mg total) by mouth 2 (two) times daily., Disp: 180 tablet, Rfl: 3   aspirin 81 MG chewable tablet, Chew 1 tablet (81 mg total) by mouth daily., Disp: 30 tablet, Rfl: 11   atorvastatin (LIPITOR) 20 MG tablet, Take 1 tablet (20 mg total) by mouth daily., Disp: 90 tablet, Rfl: 3   dapagliflozin propanediol (FARXIGA) 10 MG TABS tablet, Take 1 tablet (10 mg total) by mouth daily before breakfast., Disp: 90 tablet, Rfl: 3   Evolocumab (REPATHA SURECLICK) 140 MG/ML SOAJ, Inject 140mg  into the skin once every 14 days., Disp: 2 mL, Rfl: 3   ezetimibe (ZETIA) 10 MG tablet, Take 1 tablet (10 mg total) by mouth daily., Disp: 90 tablet, Rfl: 3   furosemide (LASIX) 20 MG tablet, Take 1 tablet (20 mg) by mouth once every other day, Disp: , Rfl:    furosemide (LASIX) 40 MG tablet, Take 1 tablet (40 mg total) by mouth daily as needed. Take for shortness of breath or abdominal swelling., Disp: 90 tablet, Rfl: 3   levothyroxine (SYNTHROID) 137 MCG tablet, Take 1 tablet (137 mcg total) by mouth daily., Disp: 90 tablet, Rfl: 0   metoprolol succinate (TOPROL-XL) 25 MG 24 hr tablet, Take 1 tablet (25 mg total) by mouth at bedtime., Disp: 90 tablet, Rfl: 0   pantoprazole (PROTONIX) 40 MG tablet, Take 1 tablet (40 mg total) by mouth 2 (two) times daily., Disp: 60 tablet, Rfl:  3   Potassium Chloride ER 20 MEQ TBCR, Take 20 mEq by mouth daily., Disp: , Rfl:    potassium chloride SA (KLOR-CON M20) 20 MEQ tablet, Take 1 tablet (20 mEq total) by mouth daily as needed. Take as needed with Lasix., Disp: 90 tablet, Rfl: 3   sildenafil (VIAGRA) 100 MG tablet, Take 1 tablet (100 mg total) by mouth as needed for erectile dysfunction. TAKE 1 (ONE) TABLET AS NEEDED. DO NOT USE AT SAME TIME AS NITRO, Disp: 30 tablet, Rfl: 3  Past Medical History: Past Medical History:  Diagnosis Date   Acute MI anterior wall first episode care (HCC) 07/13/2017   Occluded ostial LAD S/P 3.5x26 Onyx DES. RI 80%. RCA Occluded at prior stent site with collaterals    Cardiogenic shock (HCC) 07/13/2017   CHF (congestive heart failure) (HCC)    Graves disease    Hypertension    MI (myocardial infarction) (HCC)    Thyroid disease    thyroid irradiated per pt    Tobacco Use: Social History   Tobacco Use  Smoking Status Former   Current packs/day: 0.00   Average packs/day: 0.5 packs/day for 20.0 years (10.0 ttl pk-yrs)   Types: Cigarettes   Start date: 07/13/1997   Quit date: 07/13/2017   Years since quitting: 6.2  Smokeless Tobacco Never  Tobacco Comments   quit 2012  Labs: Review Flowsheet  More data exists      Latest Ref Rng & Units 07/26/2022 06/13/2023 06/22/2023 06/23/2023 06/25/2023  Labs for ITP Cardiac and Pulmonary Rehab  Cholestrol 0 - 200 mg/dL 74  71  - - -  LDL (calc) 0 - 99 mg/dL 24  18  - - -  HDL-C >16.10 mg/dL 39  96.04  - - -  Trlycerides 0.0 - 149.0 mg/dL 54  54.0  - - -  Hemoglobin A1c 4.8 - 5.6 % 6.3  6.5  - 6.4  -  PH, Arterial 7.35 - 7.45 - - 7.383  - 7.287  7.418  7.345  7.326  7.402  7.313   PCO2 arterial 32 - 48 mmHg - - 37.6  - 44.6  32.4  39.2  43.2  38.4  44.5   Bicarbonate 20.0 - 28.0 mmol/L - - 24.9  22.4  - 21.4  21.1  21.7  22.6  23.9  22.5   TCO2 22 - 32 mmol/L - - 26  24  - 23  22  23  23  24  26  25  25  24  29    Acid-base deficit 0.0 - 2.0 mmol/L -  - 1.0  2.0  - 5.0  3.0  4.0  3.0  1.0  4.0   O2 Saturation % - - 71  99  - 99  98  96  97  100  100     Details       Multiple values from one day are sorted in reverse-chronological order          Exercise Target Goals: Exercise Program Goal: Individual exercise prescription set using results from initial 6 min walk test and THRR while considering  patient's activity barriers and safety.   Exercise Prescription Goal: Initial exercise prescription builds to 30-45 minutes a day of aerobic activity, 2-3 days per week.  Home exercise guidelines will be given to patient during program as part of exercise prescription that the participant will acknowledge.   Education: Aerobic Exercise: - Group verbal and visual presentation on the components of exercise prescription. Introduces F.I.T.T principle from ACSM for exercise prescriptions.  Reviews F.I.T.T. principles of aerobic exercise including progression. Written material given at graduation.   Education: Resistance Exercise: - Group verbal and visual presentation on the components of exercise prescription. Introduces F.I.T.T principle from ACSM for exercise prescriptions  Reviews F.I.T.T. principles of resistance exercise including progression. Written material given at graduation.    Education: Exercise & Equipment Safety: - Individual verbal instruction and demonstration of equipment use and safety with use of the equipment. Flowsheet Row Cardiac Rehab from 08/22/2023 in [facility] Cardiac and Pulmonary Rehab  Date 08/22/23  Educator [facility]  Instruction Review Code 1- Verbalizes Understanding       Education: Exercise Physiology & General Exercise Guidelines: - Group verbal and written instruction with models to review the exercise physiology of the cardiovascular system and associated critical values. Provides general exercise guidelines with specific guidelines to those with heart or lung disease.  Flowsheet Row Cardiac Rehab from  11/12/2017 in [facility] Cardiac and Pulmonary Rehab  Date 10/22/17  Educator [facility]  Instruction Review Code 1- [employer] Understanding       Education: Flexibility, Balance, Mind/Body Relaxation: - Group verbal and visual presentation with interactive activity on the components of exercise prescription. Introduces F.I.T.T principle from ACSM for exercise prescriptions. Reviews F.I.T.T. principles of flexibility and balance exercise training including progression. Also discusses the  mind body connection.  Reviews various relaxation techniques to help reduce and manage stress (i.e. Deep breathing, progressive muscle relaxation, and visualization). Balance handout provided to take home. Written material given at graduation.   Activity Barriers & Risk Stratification:  Activity Barriers & Cardiac Risk Stratification - 08/22/23 1558       Activity Barriers & Cardiac Risk Stratification   Activity Barriers Back Problems;Incisional Pain    Cardiac Risk Stratification High             6 Minute Walk:  6 Minute Walk     Row Name 08/22/23 1555         6 Minute Walk   Phase Initial     Distance 1330 feet     Walk Time 6 minutes     # of Rest Breaks 0     MPH 2.52     METS 3.76     RPE 9     Perceived Dyspnea  0     VO2 Peak 13.14     Symptoms No     Resting HR 74 bpm     Resting BP 112/72     Resting Oxygen Saturation  96 %     Exercise Oxygen Saturation  during 6 min walk 98 %     Max Ex. HR 97 bpm     Max Ex. BP 122/70     2 Minute Post BP 104/62              Oxygen Initial Assessment:   Oxygen Re-Evaluation:   Oxygen Discharge (Final Oxygen Re-Evaluation):   Initial Exercise Prescription:  Initial Exercise Prescription - 08/22/23 1500       Date of Initial Exercise RX and Referring Provider   Date 08/22/23    Referring Provider Gollan      Oxygen   Maintain Oxygen Saturation 88% or higher      Treadmill   MPH 3    Grade 1    Minutes 15    METs 3.71       NuStep   Level 2   T6   SPM 80    Minutes 15    METs 3.76      Elliptical   Level 1    Speed 3    Minutes 15    METs 3.76      Prescription Details   Frequency (times per week) 3    Duration Progress to 30 minutes of continuous aerobic without [name]/symptoms of physical distress      Intensity   THRR 40-80% of Max Heartrate 111-148    Ratings of Perceived Exertion 11-13    Perceived Dyspnea 0-4      Progression   Progression Continue to progress workloads to maintain intensity without [name]/symptoms of physical distress.      Resistance Training   Training Prescription Yes    Weight 5    Reps 10-15             Perform Capillary Blood Glucose checks as needed.  Exercise Prescription Changes:   Exercise Prescription Changes     Row Name 08/22/23 1500 09/11/23 1500 09/24/23 1500 10/10/23 0700 10/24/23 0900     Response to Exercise   Blood Pressure (Admit) 112/72 104/60 132/82 102/60 110/62   Blood Pressure (Exercise) 122/70 148/68 152/76 152/70 --   Blood Pressure (Exit) 104/62 98/58 102/60 102/60 92/58   Heart Rate (Admit) 74 bpm 72 bpm 72 bpm 79 bpm 77 bpm  Heart Rate (Exercise) 97 bpm 137 bpm 128 bpm 131 bpm 141 bpm   Heart Rate (Exit) 73 bpm 94 bpm 89 bpm 95 bpm 71 bpm   Oxygen Saturation (Admit) 96 % -- -- -- --   Oxygen Saturation (Exercise) 98 % -- -- -- --   Oxygen Saturation (Exit) 98 % -- -- -- --   Rating of Perceived Exertion (Exercise) 9 15 13 15 16    Perceived Dyspnea (Exercise) 0 0 -- -- --   Symptoms none none none none none   Comments 6 MWT results -- -- -- --   Duration -- Progress to 30 minutes of  aerobic without [name]/symptoms of physical distress Continue with 30 min of aerobic exercise without [name]/symptoms of physical distress. Continue with 30 min of aerobic exercise without [name]/symptoms of physical distress. Continue with 30 min of aerobic exercise without [name]/symptoms of physical distress.   Intensity -- THRR unchanged THRR  unchanged THRR unchanged THRR unchanged     Progression   Progression -- Continue to progress workloads to maintain intensity without [name]/symptoms of physical distress. Continue to progress workloads to maintain intensity without [name]/symptoms of physical distress. Continue to progress workloads to maintain intensity without [name]/symptoms of physical distress. Continue to progress workloads to maintain intensity without [name]/symptoms of physical distress.   Average METs -- 4.22 3.91 5.73 5.22     Resistance Training   Training Prescription -- Yes Yes Yes Yes   Weight -- 5 5 lb 5 lb 10 lb   Reps -- 10-15 10-15 10-15 10-15     Interval Training   Interval Training -- No No No No     Treadmill   MPH -- 3 3 2.8 3.3   Grade -- 4 4 9 6    Minutes -- 15 15 15 15    METs -- 4.95 4.95 6.62 0.25     NuStep   Level -- 2  T6 4  T6 nustep -- --   Minutes -- 15 15 -- --   METs -- 3.2 2.2 -- --     Elliptical   Level -- 2 5 5 13    Speed -- 3 4.5 4.5 4.5   Minutes -- 15 15 15 15    METs -- 5.4 -- 5.5 7.5     Oxygen   Maintain Oxygen Saturation -- 88% or higher 88% or higher 88% or higher 88% or higher            Exercise Comments:   Exercise Comments     Row Name 08/29/23 1541           Exercise Comments First full day of exercise!  Patient was oriented to gym and equipment including functions, settings, policies, and procedures.  Patient's individual exercise prescription and treatment plan were reviewed.  All starting workloads were established based on the results of the 6 minute walk test done at initial orientation visit.  The plan for exercise progression was also introduced and progression will be customized based on patient's performance and goals.                Exercise Goals and Review:   Exercise Goals     Row Name 08/22/23 1602             Exercise Goals   Increase Physical Activity Yes       Intervention Provide advice, education, support and  counseling about physical activity/exercise needs.;Develop an individualized exercise prescription for aerobic and resistive training based on initial  evaluation findings, risk stratification, comorbidities and participant's personal goals.       Expected Outcomes Short Term: Attend rehab on a regular basis to increase amount of physical activity.;Long Term: Add in home exercise to make exercise part of routine and to increase amount of physical activity.;Long Term: Exercising regularly at least 3-5 days a week.       Increase Strength and Stamina Yes       Intervention Provide advice, education, support and counseling about physical activity/exercise needs.;Develop an individualized exercise prescription for aerobic and resistive training based on initial evaluation findings, risk stratification, comorbidities and participant's personal goals.       Expected Outcomes Short Term: Increase workloads from initial exercise prescription for resistance, speed, and METs.;Short Term: Perform resistance training exercises routinely during rehab and add in resistance training at home;Long Term: Improve cardiorespiratory fitness, muscular endurance and strength as measured by increased METs and functional capacity ( )       Able to understand and use rate of perceived exertion (RPE) scale Yes       Intervention Provide education and explanation on how to use RPE scale       Expected Outcomes Short Term: Able to use RPE daily in rehab to express subjective intensity level;Long Term:  Able to use RPE to guide intensity level when exercising independently       Able to understand and use Dyspnea scale Yes       Intervention Provide education and explanation on how to use Dyspnea scale       Expected Outcomes Short Term: Able to use Dyspnea scale daily in rehab to express subjective sense of shortness of breath during exertion;Long Term: Able to use Dyspnea scale to guide intensity level when exercising independently        Knowledge and understanding of Target Heart Rate Range (THRR) Yes       Intervention Provide education and explanation of THRR including how the numbers were predicted and where they are located for reference       Expected Outcomes Short Term: Able to state/look up THRR;Long Term: Able to use THRR to govern intensity when exercising independently;Short Term: Able to use daily as guideline for intensity in rehab       Able to check pulse independently Yes       Intervention Provide education and demonstration on how to check pulse in carotid and radial arteries.;Review the importance of being able to check your own pulse for safety during independent exercise       Expected Outcomes Short Term: Able to explain why pulse checking is important during independent exercise;Long Term: Able to check pulse independently and accurately       Understanding of Exercise Prescription Yes       Intervention Provide education, explanation, and written materials on patient's individual exercise prescription       Expected Outcomes Short Term: Able to explain program exercise prescription;Long Term: Able to explain home exercise prescription to exercise independently                Exercise Goals Re-Evaluation :  Exercise Goals Re-Evaluation     Row Name 08/29/23 1546 09/11/23 1548 09/24/23 1522 10/10/23 0754 10/24/23 0936     Exercise Goal Re-Evaluation   Exercise Goals Review Increase Physical Activity;Able to understand and use rate of perceived exertion (RPE) scale;Knowledge and understanding of Target Heart Rate Range (THRR);Increase Strength and Stamina;Able to check pulse independently;Understanding of Exercise Prescription Increase Physical Activity;Increase Strength  and Stamina;Understanding of Exercise Prescription Increase Physical Activity;Increase Strength and Stamina;Understanding of Exercise Prescription Increase Physical Activity;Increase Strength and Stamina;Understanding of Exercise  Prescription Increase Physical Activity;Increase Strength and Stamina;Understanding of Exercise Prescription   Comments Reviewed RPE  and dyspnea scale, THR and program prescription with pt today.  Pt voiced understanding and was given a copy of goals to take home. Bobby [name] has completed his first 4 sessions during this review, and continues to get familiar with the program. He recently increased his grade on the treamill from 1% to 4%. He also increased his elliptical level from 1 to 2. We will continue to monitor his progress in the program. Bobby [name] continues to do well in rehab. He has continued to walk the treadmill at a speed of 3 mph and an incline of 4%. He also improved to level 5 on the elliptical and level 4 on the T6 nustep. We will continue to monitor his progress in the program. Bobby [name] has only attended two sessions since the last review. However, he was able to increase his treadmill workload up to a speed of 2.8 mph with a 9% incline. He also continues to work at level 5 on the elliptical and use 5 lb hand weights for resistance training. We will continue to monitor his progress in the program. Bobby [name] continues to do well in rehab. He recently increased his treadmill workload to a speed of 3.3 mph while maintaining an incline of 6%. He also increased from level 5 to level 13 on the elliptical. He increased to 10 lb hand weights for resistance training as well. We will continue to monitor his progress in the program.   Expected Outcomes Short: Use RPE daily to regulate intensity.  Long: Follow program prescription in THR. Short: Continue to follow current exercise prescription, and progressively increase workloads. Long: Continue exercise to improve strength and stamina. Short: Begin to progressively increase treadmill workload. Long: Continue exercise to improve strength and stamina. Short: Attend rehab more consistently. Long: Continue exercise to improve strength and stamina. Short: Continue to progressively  increase treadmill workload. Long: Continue exercise to improve strength and stamina.            Discharge Exercise Prescription (Final Exercise Prescription Changes):  Exercise Prescription Changes - 10/24/23 0900       Response to Exercise   Blood Pressure (Admit) 110/62    Blood Pressure (Exit) 92/58    Heart Rate (Admit) 77 bpm    Heart Rate (Exercise) 141 bpm    Heart Rate (Exit) 71 bpm    Rating of Perceived Exertion (Exercise) 16    Symptoms none    Duration Continue with 30 min of aerobic exercise without [name]/symptoms of physical distress.    Intensity THRR unchanged      Progression   Progression Continue to progress workloads to maintain intensity without [name]/symptoms of physical distress.    Average METs 5.22      Resistance Training   Training Prescription Yes    Weight 10 lb    Reps 10-15      Interval Training   Interval Training No      Treadmill   MPH 3.3    Grade 6    Minutes 15    METs 0.25      Elliptical   Level 13    Speed 4.5    Minutes 15    METs 7.5      Oxygen   Maintain Oxygen Saturation 88% or higher  Nutrition:  Target Goals: Understanding of nutrition guidelines, daily intake of sodium 1500mg , cholesterol 200mg , calories 30% from fat and 7% or less from saturated fats, daily to have 5 or more servings of fruits and vegetables.  Education: All About Nutrition: -Group instruction provided by verbal, written material, interactive activities, discussions, models, and posters to present general guidelines for heart healthy nutrition including fat, fiber, MyPlate, the role of sodium in heart healthy nutrition, utilization of the nutrition label, and utilization of this knowledge for meal planning. Follow up email sent as well. Written material given at graduation. Flowsheet Row Cardiac Rehab from 11/12/2017 in [facility] Cardiac and Pulmonary Rehab  Date 10/08/17  Educator PI  Instruction Review Code 1- Verbalizes  Understanding       Biometrics:  Pre Biometrics - 08/22/23 1606       Pre Biometrics   Height 6\' 1"  (1.854 m)    Weight 221 lb 12.8 oz (100.6 kg)    Waist Circumference 42 inches    Hip Circumference 41.5 inches    Waist to Hip Ratio 1.01 %    BMI (Calculated) 29.27    Single Leg Stand 30 seconds              Nutrition Therapy Plan and Nutrition Goals:  Nutrition Therapy & Goals - 08/22/23 1607       Intervention Plan   Intervention Prescribe, educate and counsel regarding individualized specific dietary modifications aiming towards targeted core components such as weight, hypertension, lipid management, diabetes, heart failure and other comorbidities.    Expected Outcomes Short Term Goal: Understand basic principles of dietary content, such as calories, fat, sodium, cholesterol and nutrients.;Short Term Goal: A plan has been developed with personal nutrition goals set during dietitian appointment.;Long Term Goal: Adherence to prescribed nutrition plan.             Nutrition Assessments:  MEDIFICTS Score Key: >=70 Need to make dietary changes  40-70 Heart Healthy Diet <= 40 Therapeutic Level Cholesterol Diet  Flowsheet Row Cardiac Rehab from 08/22/2023 in [facility] Cardiac and Pulmonary Rehab  Picture Your Plate Total Score on Admission 69      Picture Your Plate Scores: <40 Unhealthy dietary pattern with much room for improvement. 41-50 Dietary pattern unlikely to meet recommendations for good health and room for improvement. 51-60 More healthful dietary pattern, with some room for improvement.  >60 Healthy dietary pattern, although there may be some specific behaviors that could be improved.    Nutrition Goals Re-Evaluation:  Nutrition Goals Re-Evaluation     Row Name 09/06/23 1555 10/15/23 1541           Goals   Current Weight 216 lb (98 kg) --      Comment Bobby [name] defers dietitian appointment. Bobby [name] defers dietitian appointment.                Nutrition Goals Discharge (Final Nutrition Goals Re-Evaluation):  Nutrition Goals Re-Evaluation - 10/15/23 1541       Goals   Comment Bobby [name] defers dietitian appointment.             Psychosocial: Target Goals: Acknowledge presence or absence of significant depression and/or stress, maximize coping skills, provide positive support system. Participant is able to verbalize types and ability to use techniques and skills needed for reducing stress and depression.   Education: Stress, Anxiety, and Depression - Group verbal and visual presentation to define topics covered.  Reviews how body is impacted by stress, anxiety, and depression.  Also  discusses healthy ways to reduce stress and to treat/manage anxiety and depression.  Written material given at graduation.   Education: Sleep Hygiene -Provides group verbal and written instruction about how sleep can affect your health.  Define sleep hygiene, discuss sleep cycles and impact of sleep habits. Review good sleep hygiene tips.    Initial Review & Psychosocial Screening:  Initial Psych Review & Screening - 08/07/23 1309       Initial Review   Current issues with Current Sleep Concerns      Family Dynamics   Good Support System? Yes   wife     Barriers   Psychosocial barriers to participate in program There are no identifiable barriers or psychosocial needs.;The patient should benefit from training in stress management and relaxation.      Screening Interventions   Expected Outcomes Short Term goal: Utilizing psychosocial counselor, staff and physician to assist with identification of specific Stressors or current issues interfering with healing process. Setting desired goal for each stressor or current issue identified.;Short Term goal: Identification and review with participant of any Quality of Life or Depression concerns found by scoring the questionnaire.;Long Term Goal: Stressors or current issues are controlled or  eliminated.;Long Term goal: The participant improves quality of Life and PHQ9 Scores as seen by post scores and/or verbalization of changes             Quality of Life Scores:   Quality of Life - 08/22/23 1610       Quality of Life   Select Quality of Life      Quality of Life Scores   Health/Function Pre 16.27 %    Socioeconomic Pre 17.44 %    Psych/Spiritual Pre 16.93 %    Family Pre 17.3 %    GLOBAL Pre 16.81 %            Scores of 19 and below usually indicate a poorer quality of life in these areas.  A difference of  2-3 points is a clinically meaningful difference.  A difference of 2-3 points in the total score of the Quality of Life Index has been associated with significant improvement in overall quality of life, self-image, physical symptoms, and general health in studies assessing change in quality of life.  PHQ-9: Review Flowsheet  More data exists      09/06/2023 08/22/2023 06/20/2023 05/23/2023 10/15/2017  Depression screen PHQ 2/9  Decreased Interest 0 1 1 1 1   Down, Depressed, Hopeless 0 1 1 1  0  PHQ - 2 Score 0 2 2 2 1   Altered sleeping 1 2 1 2  0  Tired, decreased energy 1 1 1 3 1   Change in appetite 0 0 1 3 0  Feeling bad or failure about yourself  0 0 1 0 0  Trouble concentrating 2 2 1 3 1   Moving slowly or fidgety/restless 0 0 1 0 0  Suicidal thoughts 0 0 0 0 0  PHQ-9 Score 4 7 8 13 3   Difficult doing work/chores Somewhat difficult Somewhat difficult Somewhat difficult Somewhat difficult -    Details           Interpretation of Total Score  Total Score Depression Severity:  1-4 = Minimal depression, 5-9 = Mild depression, 10-14 = Moderate depression, 15-19 = Moderately severe depression, 20-27 = Severe depression   Psychosocial Evaluation and Intervention:  Psychosocial Evaluation - 08/07/23 1325       Psychosocial Evaluation & Interventions   Interventions Encouraged to exercise with the  program and follow exercise prescription     Comments Bobby [name] is coming to 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 He did the program in 2018 after an MI. He states he doesn't feel stressed currenlty, just trying to be patient with the healing process. 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 His wife and dog are his main support system. He states his sleep has been horrible since surgery and sometimes he takes a Tylenol PM. He thinks it is slowly getting better, but he is up many times a night. His sternum incision did open and require debridement, he is almost done doing wet to dry dressing. He wants to work on his stamina and strength. He was supposed to participate in a [employer] race, but is unable to this year. His goal is to now be able to participate in this next year.    Expected Outcomes Short: attend cardiac rehab for education and exercise. Long: develop and maintain positive self care    Continue Psychosocial Services  Follow up required by staff             Psychosocial Re-Evaluation:  Psychosocial Re-Evaluation     Row Name 09/06/23 1557 10/15/23 1538           Psychosocial Re-Evaluation   Current issues with Current Stress Concerns None Identified      Comments Reviewed patient health questionnaire (PHQ-9) with patient for follow up. Previously, patients score indicated [name]/symptoms of depression.  Reviewed to see if patient is improving symptom wise while in program.  Score improved and patient states that it is because he has good support at home and his health has been getting better. Patient reports no issues with their current mental states, sleep, stress, depression or anxiety. Will follow up with patient in a few weeks for any changes.      Expected Outcomes Short: Continue to attend [facility] regularly for regular exercise and social engagement. Long: Continue to improve symptoms and manage a positive mental state. Short: Continue to exercise regularly to support  mental health and notify staff of any changes. Long: maintain mental health and well being through teaching of rehab or prescribed medications independently.      Interventions Encouraged to attend Cardiac Rehabilitation for the exercise Encouraged to attend Cardiac Rehabilitation for the exercise      Continue Psychosocial Services  Follow up required by staff Follow up required by staff               Psychosocial Discharge (Final Psychosocial Re-Evaluation):  Psychosocial Re-Evaluation - 10/15/23 1538       Psychosocial Re-Evaluation   Current issues with None Identified    Comments Patient reports no issues with their current mental states, sleep, stress, depression or anxiety. Will follow up with patient in a few weeks for any changes.    Expected Outcomes Short: Continue to exercise regularly to support mental health and notify staff of any changes. Long: maintain mental health and well being through teaching of rehab or prescribed medications independently.    Interventions Encouraged to attend Cardiac Rehabilitation for the exercise    Continue Psychosocial Services  Follow up required by staff             Vocational Rehabilitation: Provide vocational rehab assistance to qualifying candidates.   Vocational Rehab Evaluation & Intervention:  Vocational Rehab - 08/07/23 1308       Initial  Vocational Rehab Evaluation & Intervention   Assessment shows need for Vocational Rehabilitation No             Education: Education Goals: Education classes will be provided on a variety of topics geared toward better understanding of heart health and risk factor modification. Participant will state understanding/return demonstration of topics presented as noted by education test scores.  Learning Barriers/Preferences:  Learning Barriers/Preferences - 08/07/23 1308       Learning Barriers/Preferences   Learning Barriers None    Learning Preferences Individual Instruction              General Cardiac Education Topics:  AED/CPR: - Group verbal and written instruction with the use of models to demonstrate the basic use of the AED with the basic ABC's of resuscitation.   Anatomy and Cardiac Procedures: - Group verbal and visual presentation and models provide information about basic cardiac anatomy and function. Reviews the testing methods done to diagnose heart disease and the outcomes of the test results. Describes the treatment choices: Medical Management, Angioplasty, or Coronary Bypass Surgery for treating various heart conditions including Myocardial Infarction, Angina, Valve Disease, and Cardiac Arrhythmias.  Written material given at graduation. Flowsheet Row Cardiac Rehab from 08/22/2023 in [facility] Cardiac [facility] Rehab  Education need identified 08/22/23       Medication Safety: - Group verbal and visual instruction to review commonly prescribed medications for heart and lung disease. Reviews the medication, class of the drug, and side effects. Includes the steps to properly store meds and maintain the prescription regimen.  Written material given at graduation.   Intimacy: - Group verbal instruction through game format to discuss how heart and lung disease can affect sexual intimacy. Written material given at graduation..   Know Your Numbers and Heart Failure: - Group verbal and visual instruction to discuss disease risk factors for cardiac and pulmonary disease and treatment options.  Reviews associated critical values for Overweight/Obesity, Hypertension, Cholesterol, and Diabetes.  Discusses basics of heart failure: [name]/symptoms and treatments.  Introduces Heart Failure Zone chart for action plan for heart failure.  Written material given at graduation. Flowsheet Row Cardiac Rehab from 11/12/2017 in [facility] Cardiac and Pulmonary Rehab  Date 11/05/17  Educator [facility]  Instruction Review Code 1- Verbalizes Understanding       Infection  Prevention: - Provides verbal and written material to individual with discussion of infection control including proper hand washing and proper equipment cleaning during exercise session. Flowsheet Row Cardiac Rehab from 08/22/2023 in [facility] Cardiac and Pulmonary Rehab  Date 08/22/23  Educator [facility]  Instruction Review Code 1- Verbalizes Understanding       Falls Prevention: - Provides verbal and written material to individual with discussion of falls prevention and safety. Flowsheet Row Cardiac Rehab from 08/22/2023 in [facility] Cardiac and Pulmonary Rehab  Date 08/22/23  Educator [facility]  Instruction Review Code 1- Verbalizes Understanding       Other: -Provides group and verbal instruction on various topics (see comments) Flowsheet Row Cardiac Rehab from 11/12/2017 in [facility] Cardiac and Pulmonary Rehab  Date 10/31/17  [Risk Factors and Know Your Numbers]  Educator [facility]  Instruction Review Code 1- Verbalizes Understanding       Knowledge Questionnaire Score:  Knowledge Questionnaire Score - 08/22/23 1611       Knowledge Questionnaire Score   Pre Score 25/26             Core Components/Risk Factors/Patient Goals at Admission:  Personal Goals and Risk Factors  at Admission - 08/22/23 1607       Core Components/Risk Factors/Patient Goals on Admission    Weight Management Yes;Weight Loss    Intervention Weight Management: Develop a combined nutrition and exercise program designed to reach desired caloric intake, while maintaining appropriate intake of nutrient and fiber, sodium and fats, and appropriate energy expenditure required for the weight goal.;Weight Management: Provide education and appropriate resources to help participant work on and attain dietary goals.;Weight Management/Obesity: Establish reasonable short term and long term weight goals.;Obesity: Provide education and appropriate resources to help participant work on and attain dietary goals.    Admit Weight 221 lb 12.8 oz  (100.6 kg)    Goal Weight: Short Term 215 lb (97.5 kg)    Goal Weight: Long Term 210 lb (95.3 kg)    Expected Outcomes Short Term: Continue to assess and modify interventions until short term weight is achieved;Long Term: Adherence to nutrition and physical activity/exercise program aimed toward attainment of established weight goal;Weight Loss: Understanding of general recommendations for a balanced deficit meal plan, which promotes 1-2 lb weight loss per week and includes a negative energy balance of [phone] kcal/d;Understanding recommendations for meals to include 15-35% energy as protein, 25-35% energy from fat, 35-60% energy from carbohydrates, less than 200mg  of dietary cholesterol, 20-35 gm of total fiber daily;Understanding of distribution of calorie intake throughout the day with the consumption of 4-5 meals/snacks    Hypertension Yes    Intervention Provide education on lifestyle modifcations including regular physical activity/exercise, weight management, moderate sodium restriction and increased consumption of fresh fruit, vegetables, and low fat dairy, alcohol moderation, and smoking cessation.;Monitor prescription use compliance.    Expected Outcomes Short Term: Continued assessment and intervention until BP is < 140/44mm HG in hypertensive participants. < 130/68mm HG in hypertensive participants with diabetes, heart failure or chronic kidney disease.;Long Term: Maintenance of blood pressure at goal levels.    Lipids Yes    Intervention Provide education and support for participant on nutrition & aerobic/resistive exercise along with prescribed medications to achieve LDL 70mg , HDL >40mg .    Expected Outcomes Short Term: Participant states understanding of desired cholesterol values and is compliant with medications prescribed. Participant is following exercise prescription and nutrition guidelines.;Long Term: Cholesterol controlled with medications as prescribed, with individualized exercise  RX and with personalized nutrition plan. Value goals: LDL < 70mg , HDL > 40 mg.             Education:Diabetes - Individual verbal and written instruction to review [name]/symptoms of diabetes, desired ranges of glucose level fasting, after meals and with exercise. Acknowledge that pre and post exercise glucose checks will be done for 3 sessions at entry of program.   Core Components/Risk Factors/Patient Goals Review:   Goals and Risk Factor Review     Row Name 09/06/23 1553 10/15/23 1541           Core Components/Risk Factors/Patient Goals Review   Personal Goals Review Weight Management/Obesity;Other Weight Management/Obesity;Other      Review Jaquavis state that his weight has gone down. He has started taking Lasix and it is helping him breath better and get that weight off. He wants to reach a weight goal below 210 pounds. Erskine was 221 pounds when he started rehab. He is now weighing 215 pounds. He would like to get to 210 pounds. Treighton has been increasing his loads in class as needed and is working to [employer] his breathing and fluid retention. He takes lasix 20mg  as day. He  can take an extra lasix if his weight is up.      Expected Outcomes Short: lose a few pounds in a couple weeks. Long: reach weight. Short: lose weight and maintain fluid. Long: reach weight goal and keep weight off independently.               Core Components/Risk Factors/Patient Goals at Discharge (Final Review):   Goals and Risk Factor Review - 10/15/23 1541       Core Components/Risk Factors/Patient Goals Review   Personal Goals Review Weight Management/Obesity;Other    Review Mamoru was 221 pounds when he started rehab. He is now weighing 215 pounds. He would like to get to 210 pounds. Camp has been increasing his loads in class as needed and is working to [employer] his breathing and fluid retention. He takes lasix 20mg  as day. He can take an extra lasix if his weight is up.    Expected Outcomes Short: lose  weight and maintain fluid. Long: reach weight goal and keep weight off independently.             ITP Comments:  ITP Comments     Row Name 08/07/23 1323 08/22/23 1555 08/29/23 1104 08/29/23 1540 09/26/23 1228   ITP Comments Initial phone call completed. Diagnosis can be found in [facility] 6/28. EP Orientation scheduled for Wednesday 8/28 at 2:30. Completed and gym orientation. Initial ITP created and sent for review to Dr. Bethann Punches, Medical Director. 30 Day review completed. Medical Director ITP review done, changes made as directed, and signed approval by Medical Director.     new to program First full day of exercise!  Patient was oriented to gym and equipment including functions, settings, policies, and procedures.  Patient's individual exercise prescription and treatment plan were reviewed.  All starting workloads were established based on the results of the 6 minute walk test done at initial orientation visit.  The plan for exercise progression was also introduced and progression will be customized based on patient's performance and goals. 30 Day review completed. Medical Director ITP review done, changes made as directed, and signed approval by Medical Director.    new to program    Row Name 10/03/23 1115 10/24/23 1515         ITP Comments Dahlton called to tell us his Apple watch has shown several times that he was in atrial fib. He has been sick all week. He called to ask if he should come to CR. Advised him to call his doctor and review his illness, the Apple watch findings and see if he should be seen for further evaluation.  Will cancel today's visit and see what his doctor says about continuing CR with afib. 30 Day review completed. Medical Director ITP review done, changes made as directed, and signed approval by Medical Director.               Comments:

## 2023-11-01 ENCOUNTER — Encounter: Payer: Self-pay | Admitting: Family Medicine

## 2023-11-05 ENCOUNTER — Other Ambulatory Visit: Payer: Self-pay

## 2023-11-05 ENCOUNTER — Other Ambulatory Visit: Payer: Self-pay | Admitting: *Deleted

## 2023-11-05 MED ORDER — CEPHALEXIN 500 MG PO CAPS
500.0000 mg | ORAL_CAPSULE | Freq: Three times a day (TID) | ORAL | 0 refills | Status: DC
  Filled 2023-11-05: qty 21, 7d supply, fill #0

## 2023-11-05 NOTE — Progress Notes (Signed)
Patient's wife contacted the office stating sternal incision wound has begun draining again. Patient contacted. States area on midline incision became painful over the weekend and a small lump appeared. States he took a shower and ran his fingers along the incision and was able to produce yellow/pink drainage from wound. States he now has a pin sized opening on incision. Photos sent in via MyChart. Photos reviewed by E. Barrett, PA. Patient advised per PA to begin taking Kelfex for 7 days. Advised to not push at area. Advised to keep area clean with soap and water. Advised to call office on Friday if area does not get any better with antibiotics for wound check. Patient verbalized understanding. Aware of prescription.

## 2023-11-07 ENCOUNTER — Ambulatory Visit: Payer: 59 | Admitting: Family Medicine

## 2023-11-07 ENCOUNTER — Encounter: Payer: 59 | Attending: Thoracic Surgery (Cardiothoracic Vascular Surgery) | Admitting: *Deleted

## 2023-11-07 DIAGNOSIS — R002 Palpitations: Secondary | ICD-10-CM | POA: Diagnosis not present

## 2023-11-07 DIAGNOSIS — Z951 Presence of aortocoronary bypass graft: Secondary | ICD-10-CM | POA: Insufficient documentation

## 2023-11-07 NOTE — Progress Notes (Signed)
Daily Session Note  Patient Details  Name: Bobby [name] MRN: [id number] Date of Birth: [birth year] Referring Provider:   Flowsheet Row Cardiac Rehab from 08/22/2023 in [facility] Cardiac and Pulmonary Rehab  Referring Provider Gollan       Encounter Date: 11/07/2023  Check In:  Session Check In - 11/07/23 1541       Check-In   Supervising physician immediately available to respond to emergencies See telemetry face sheet for immediately available ER MD    Location [facility]-Cardiac & Pulmonary Rehab    Staff Present Susann Givens, RN BSN;Joseph [employer], RCP,RRT,BSRT;Kelly Maumelle, BS, ACSM CEP, Exercise Physiologist;Megan Katrinka Blazing, RN, [place]    Virtual Visit No    Medication changes reported     Yes    Comments stopped carvedilol and added metoprolol    Fall or balance concerns reported    No    Warm-up and Cool-down Performed on first and last piece of equipment    Resistance Training Performed Yes    VAD Patient? No    PAD/SET Patient? No      Pain Assessment   Currently in Pain? No/denies                Social History   Tobacco Use  Smoking Status Former   Current packs/day: 0.00   Average packs/day: 0.5 packs/day for 20.0 years (10.0 ttl pk-yrs)   Types: Cigarettes   Start date: 07/13/1997   Quit date: 07/13/2017   Years since quitting: 6.3  Smokeless Tobacco Never  Tobacco Comments   quit 2012    Goals Met:  Independence with exercise equipment Exercise tolerated well No report of concerns or symptoms today Strength training completed today  Goals Unmet:  Not Applicable  Comments: Pt able to follow exercise prescription today without complaint.  Will continue to monitor for progression.    Dr. Bethann Punches is Medical Director for [facility] Cardiac Rehabilitation.  Dr. Vida Rigger is Medical Director for [facility] Pulmonary Rehabilitation.

## 2023-11-08 ENCOUNTER — Telehealth (INDEPENDENT_AMBULATORY_CARE_PROVIDER_SITE_OTHER): Payer: 59 | Admitting: Family Medicine

## 2023-11-08 ENCOUNTER — Other Ambulatory Visit: Payer: Self-pay

## 2023-11-08 ENCOUNTER — Encounter: Payer: Self-pay | Admitting: Family Medicine

## 2023-11-08 VITALS — Ht 73.0 in | Wt 217.0 lb

## 2023-11-08 DIAGNOSIS — E063 Autoimmune thyroiditis: Secondary | ICD-10-CM

## 2023-11-08 DIAGNOSIS — R000 Tachycardia, unspecified: Secondary | ICD-10-CM | POA: Diagnosis not present

## 2023-11-08 DIAGNOSIS — G479 Sleep disorder, unspecified: Secondary | ICD-10-CM

## 2023-11-08 DIAGNOSIS — I480 Paroxysmal atrial fibrillation: Secondary | ICD-10-CM | POA: Diagnosis not present

## 2023-11-08 DIAGNOSIS — F39 Unspecified mood [affective] disorder: Secondary | ICD-10-CM

## 2023-11-08 DIAGNOSIS — T8149XA Infection following a procedure, other surgical site, initial encounter: Secondary | ICD-10-CM

## 2023-11-08 DIAGNOSIS — E785 Hyperlipidemia, unspecified: Secondary | ICD-10-CM | POA: Diagnosis not present

## 2023-11-08 MED ORDER — ESZOPICLONE 1 MG PO TABS
1.0000 mg | ORAL_TABLET | Freq: Every evening | ORAL | 0 refills | Status: DC | PRN
  Filled 2023-11-08: qty 30, 30d supply, fill #0

## 2023-11-08 NOTE — Progress Notes (Signed)
Virtual Visit via Video note  I connected with Bobby [name] on 11/16/23 at 0915 by video and verified that I am speaking with the correct person using two identifiers. Bobby [name] is currently located at home and is currently alone during visit. The provider, Dana Allan, MD is located in their office at time of visit.  I discussed the limitations, risks, security and privacy concerns of performing an evaluation and management service by video and the availability of in person appointments. I also discussed with the patient that there may be a patient responsible charge related to this service. The patient expressed understanding and agreed to proceed.  Subjective: PCP: Dana Allan, MD  Chief Complaint  Patient presents with   Insomnia    Since open heart surgery July 1 but has gotten worse. Has tired melatonin does not have trouble going to sleep it is just staying a sleep.     HPI Discussed the use of AI scribe software for clinical note transcription with the patient, who gave verbal consent to proceed.  History of Present Illness The patient, with a complex cardiac history including open heart surgery, presents with worsening insomnia over the past two to three weeks. They report no difficulty falling asleep, but consistently wake around 2 AM and struggle to return to sleep. This pattern persists regardless of bedtime, resulting in an average of three to four hours of sleep per night. The patient has previously been prescribed Xanax for anxiety and sleep issues post open heart surgery, which they report may have been somewhat beneficial, though their recollection of this period is unclear.  The patient is currently on a regimen of amiodarone, Eliquis, aspirin, atorvastatin, Zetia, Repatha, Keflex, Lasix, Synthroid, metoprolol, Protonix, potassium, and Viagra. They have recently transitioned from carvedilol to metoprolol due to blood pressure concerns and potential heart rhythm issues.  The patient also reports a history of thyroid issues, with recent lab results indicating a high TSH level, potentially influenced by the amiodarone.  The patient has tried melatonin for sleep issues in the past, but expresses concern about potential interactions with their current medications. They deny any significant mental health concerns, stating they are "doing pretty fair" despite their complex medical history and ongoing struggles post-surgery. They were previously on Cymbalta, an antidepressant, but this has been discontinued for reasons unknown to the patient.   ROS: Per HPI  Current Outpatient Medications:    amiodarone (PACERONE) 200 MG tablet, Take 1 tablet (200 mg total) by mouth daily., Disp: 90 tablet, Rfl: 3   apixaban (ELIQUIS) 5 MG TABS tablet, Take 1 tablet (5 mg total) by mouth 2 (two) times daily., Disp: 180 tablet, Rfl: 3   aspirin 81 MG chewable tablet, Chew 1 tablet (81 mg total) by mouth daily., Disp: 30 tablet, Rfl: 11   atorvastatin (LIPITOR) 20 MG tablet, Take 1 tablet (20 mg total) by mouth daily., Disp: 90 tablet, Rfl: 3   cephALEXin (KEFLEX) 500 MG capsule, Take 1 capsule (500 mg total) by mouth 3 (three) times daily for 7 days., Disp: 21 capsule, Rfl: 0   dapagliflozin propanediol (FARXIGA) 10 MG TABS tablet, Take 1 tablet (10 mg total) by mouth daily before breakfast., Disp: 90 tablet, Rfl: 3   eszopiclone (LUNESTA) 1 MG TABS tablet, Take 1 tablet (1 mg total) by mouth at bedtime as needed for sleep. Take immediately before bedtime, Disp: 30 tablet, Rfl: 0   Evolocumab (REPATHA SURECLICK) 140 MG/ML SOAJ, Inject 140mg  into the skin once every 14  days., Disp: 2 mL, Rfl: 3   ezetimibe (ZETIA) 10 MG tablet, Take 1 tablet (10 mg total) by mouth daily., Disp: 90 tablet, Rfl: 3   furosemide (LASIX) 20 MG tablet, Take 1 tablet (20 mg) by mouth once every other day, Disp: , Rfl:    furosemide (LASIX) 40 MG tablet, Take 1 tablet (40 mg total) by mouth daily as needed. Take for  shortness of breath or abdominal swelling., Disp: 90 tablet, Rfl: 3   levothyroxine (SYNTHROID) 137 MCG tablet, Take 1 tablet (137 mcg total) by mouth daily., Disp: 90 tablet, Rfl: 0   metoprolol succinate (TOPROL-XL) 25 MG 24 hr tablet, Take 1 tablet (25 mg total) by mouth at bedtime., Disp: 90 tablet, Rfl: 0   pantoprazole (PROTONIX) 40 MG tablet, Take 1 tablet (40 mg total) by mouth 2 (two) times daily., Disp: 60 tablet, Rfl: 3   Potassium Chloride ER 20 MEQ TBCR, Take 20 mEq by mouth daily., Disp: , Rfl:    potassium chloride SA (KLOR-CON M20) 20 MEQ tablet, Take 1 tablet (20 mEq total) by mouth daily as needed. Take as needed with Lasix., Disp: 90 tablet, Rfl: 3   sildenafil (VIAGRA) 100 MG tablet, Take 1 tablet (100 mg total) by mouth as needed for erectile dysfunction. TAKE 1 (ONE) TABLET AS NEEDED. DO NOT USE AT SAME TIME AS NITRO, Disp: 30 tablet, Rfl: 3  Observations/Objective: Physical Exam Vitals reviewed.  Constitutional:      General: He is not in acute distress.    Appearance: Normal appearance. He is not toxic-appearing.  Eyes:     Conjunctiva/sclera: Conjunctivae normal.  Pulmonary:     Effort: Pulmonary effort is normal.  Neurological:     Mental Status: He is alert. Mental status is at baseline.  Psychiatric:        Mood and Affect: Mood normal.        Behavior: Behavior normal.        Thought Content: Thought content normal.        Judgment: Judgment normal.    Assessment and Plan: Sleep disturbance Assessment & Plan: Difficulty staying asleep.  Average of 3-4 hours night since discharge from hospital s/p CABG. Took Xanax that did not help  -Start Lunesta  Orders: -     Eszopiclone; Take 1 tablet (1 mg total) by mouth at bedtime as needed for sleep. Take immediately before bedtime  Dispense: 30 tablet; Refill: 0  Tachycardia  Hypothyroidism due to Hashimoto thyroiditis Assessment & Plan: Recent TSH levels at high end of normal range or slightly elevated.  Patient on levothyroxine daily and amiodarone.  Likely transient elevation from recent addition of amiodarone. -Can repeat Thyroid levels in 3 months to ensure back to baseline   Mood disorder [facility]) Assessment & Plan: Worsening over the past 2-3 weeks, difficulty maintaining sleep, waking up around 2 AM, unable to return to sleep. Previously used Xanax post open-heart surgery with some benefit. -Start Lunesta 1 mg qhs -Request patient feedback after 1 week of use. -Encouraged CBT -Offered SSRI, not interested today -Duloxetine discontinued, unknown why.  Patient not interested in restarting -Offered referral to psychiatry, patient politely declined.    Wound infection after surgery Assessment & Plan: Patient on Keflex for reopened chest wound post open-heart surgery. -Continue Keflex as prescribed. -Follow up with Cardiothoracic surgery as scheduled   Hyperlipidemia, unspecified hyperlipidemia type Assessment & Plan: Patient on atorvastatin 20mg , Zetia, and Repatha for cholesterol management. -Continue current regimen.   Paroxysmal atrial  fibrillation [facility]) Assessment & Plan: Patient recently switched from carvedilol to metoprolol 25mg  due to blood pressure concerns and rhythm issues. -Continue metoprolol 25mg . -Continue Eliquis 5 mg BID -Continue to follow up with Cardiology     Follow Up Instructions: Return if symptoms worsen or fail to improve, for PCP.   I discussed the assessment and treatment plan with the patient. The patient was provided an opportunity to ask questions and all were answered. The patient agreed with the plan and demonstrated an understanding of the instructions.   The patient was advised to call back or seek an in-person evaluation if the symptoms worsen or if the condition fails to improve as anticipated.  The above assessment and management plan was discussed with the patient. The patient verbalized understanding of and has agreed to the  management plan. Patient is aware to call the clinic if symptoms persist or worsen. Patient is aware when to return to the clinic for a follow-up visit. Patient educated on when it is appropriate to go to the emergency department.   Dana Allan, MD

## 2023-11-08 NOTE — Patient Instructions (Signed)
It was a pleasure meeting you today. Thank you for allowing me to take part in your health care.  Our goals for today as we discussed include:  Start Lunesta 1 mg at night as needed  Follow up as needed  [email or web address] [address] [place] [phone]   [facility] [place]  [address] [place] [address] [phone]    [email or web address]   If you have any questions or concerns, please do not hesitate to call the office at [phone].  I look forward to our next visit and until then take care and stay safe.  Regards,   Dana Allan, MD   [facility]

## 2023-11-12 ENCOUNTER — Encounter: Payer: 59 | Admitting: *Deleted

## 2023-11-12 DIAGNOSIS — Z951 Presence of aortocoronary bypass graft: Secondary | ICD-10-CM | POA: Diagnosis not present

## 2023-11-12 NOTE — Progress Notes (Signed)
Daily Session Note  Patient Details  Name: Bobby [name] MRN: [id number] Date of Birth: [birth year] Referring Provider:   Flowsheet Row Cardiac Rehab from 08/22/2023 in [facility] Cardiac and Pulmonary Rehab  Referring Provider Gollan       Encounter Date: 11/12/2023  Check In:  Session Check In - 11/12/23 1536       Check-In   Supervising physician immediately available to respond to emergencies See telemetry face sheet for immediately available ER MD    Location [facility]-Cardiac & Pulmonary Rehab    Staff Present Susann Givens, RN BSN;Joseph Rome, RCP,RRT,BSRT;Krista [place] RN, Atilano Median, RN, [place]    Virtual Visit No    Medication changes reported     No    Fall or balance concerns reported    No    Warm-up and Cool-down Performed on first and last piece of equipment    Resistance Training Performed Yes    VAD Patient? No    PAD/SET Patient? No      Pain Assessment   Currently in Pain? No/denies                Social History   Tobacco Use  Smoking Status Former   Current packs/day: 0.00   Average packs/day: 0.5 packs/day for 20.0 years (10.0 ttl pk-yrs)   Types: Cigarettes   Start date: 07/13/1997   Quit date: 07/13/2017   Years since quitting: 6.3  Smokeless Tobacco Never  Tobacco Comments   quit 2012    Goals Met:  Independence with exercise equipment Exercise tolerated well No report of concerns or symptoms today Strength training completed today  Goals Unmet:  Not Applicable  Comments: Pt able to follow exercise prescription today without complaint.  Will continue to monitor for progression.    Dr. Bethann Punches is Medical Director for [facility] Cardiac Rehabilitation.  Dr. Vida Rigger is Medical Director for [facility] Pulmonary Rehabilitation.

## 2023-11-14 ENCOUNTER — Encounter: Payer: 59 | Admitting: *Deleted

## 2023-11-14 ENCOUNTER — Encounter: Payer: Self-pay | Admitting: *Deleted

## 2023-11-14 DIAGNOSIS — Z951 Presence of aortocoronary bypass graft: Secondary | ICD-10-CM | POA: Diagnosis not present

## 2023-11-14 NOTE — Progress Notes (Signed)
Daily Session Note  Patient Details  Name: Bobby [name] MRN: [id number] Date of Birth: 1971/05/07 Referring Provider:   Flowsheet Row Cardiac Rehab from 08/22/2023 in [facility] Card[facility] Re[facility]  Referring Provider Gollan       Encounter Date: 11/14/2023  Check In:  Session Check In - 11/14/23 1552       Check-In   Supervising physician immediately available to respond to emergencies See telemetry face sheet for immediately available ER MD    Location [facility]-Cardiac & Pulmonary Rehab    Staff Present Susann Givens, RN BSN;Joseph Reino [place], RCP,RRT,BSRT;Maxon [place], , Exercise Physiologist;Kelly Madilyn Fireman, BS, ACSM CEP, Exercise Physiologist    Virtual Visit No    Medication changes reported     No    Fall or balance concerns reported    No    Warm-up and Cool-down Performed on first and last piece of equipment    Resistance Training Performed Yes    VAD Patient? No    PAD/SET Patient? No      Pain Assessment   Currently in Pain? No/denies                Social History   Tobacco Use  Smoking Status Former   Current packs/day: 0.00   Average packs/day: 0.5 packs/day for 20.0 years (10.0 ttl pk-yrs)   Types: Cigarettes   Start date: 07/13/1997   Quit date: 07/13/2017   Years since quitting: 6.3  Smokeless Tobacco Never  Tobacco Comments   quit 2012    Goals Met:  Independence with exercise equipment Exercise tolerated well No report of concerns or symptoms today Strength training completed today  Goals Unmet:  Not Applicable  Comments: Pt able to follow exercise prescription today without complaint.  Will continue to monitor for progression.    Dr. Bethann Punches is Medical Director for [facility] Cardiac Rehabilitation.  Dr. Vida Rigger is Medical Director for [facility] Pulmonary Rehabilitation.

## 2023-11-14 NOTE — Progress Notes (Signed)
Cardiac Individual Treatment Plan  Patient Details  Name: Bobby [name] MRN: [id number] Date of Birth: 18-Jul-1971 Referring Provider:   Flowsheet Row Cardiac Rehab from 08/22/2023 in [facility] Cardiac and Pulmonary Rehab  Referring Provider Gollan       Initial Encounter Date:  Flowsheet Row Cardiac Rehab from 08/22/2023 in [facility] Cardiac and Pulmonary Rehab  Date 08/22/23       Visit Diagnosis: S/P CABG x 1  Patient's Home Medications on Admission:  Current Outpatient Medications:    amiodarone (PACERONE) 200 MG tablet, Take 1 tablet (200 mg total) by mouth daily., Disp: 90 tablet, Rfl: 3   apixaban (ELIQUIS) 5 MG TABS tablet, Take 1 tablet (5 mg total) by mouth 2 (two) times daily., Disp: 180 tablet, Rfl: 3   aspirin 81 MG chewable tablet, Chew 1 tablet (81 mg total) by mouth daily., Disp: 30 tablet, Rfl: 11   atorvastatin (LIPITOR) 20 MG tablet, Take 1 tablet (20 mg total) by mouth daily., Disp: 90 tablet, Rfl: 3   cephALEXin (KEFLEX) 500 MG capsule, Take 1 capsule (500 mg total) by mouth 3 (three) times daily for 7 days., Disp: 21 capsule, Rfl: 0   dapagliflozin propanediol (FARXIGA) 10 MG TABS tablet, Take 1 tablet (10 mg total) by mouth daily before breakfast., Disp: 90 tablet, Rfl: 3   eszopiclone (LUNESTA) 1 MG TABS tablet, Take 1 tablet (1 mg total) by mouth at bedtime as needed for sleep. Take immediately before bedtime, Disp: 30 tablet, Rfl: 0   Evolocumab (REPATHA SURECLICK) 140 MG/ML SOAJ, Inject 140mg  into the skin once every 14 days., Disp: 2 mL, Rfl: 3   ezetimibe (ZETIA) 10 MG tablet, Take 1 tablet (10 mg total) by mouth daily., Disp: 90 tablet, Rfl: 3   furosemide (LASIX) 20 MG tablet, Take 1 tablet (20 mg) by mouth once every other day, Disp: , Rfl:    furosemide (LASIX) 40 MG tablet, Take 1 tablet (40 mg total) by mouth daily as needed. Take for shortness of breath or abdominal swelling., Disp: 90 tablet, Rfl: 3   levothyroxine (SYNTHROID) 137 MCG tablet, Take 1 tablet  (137 mcg total) by mouth daily., Disp: 90 tablet, Rfl: 0   metoprolol succinate (TOPROL-XL) 25 MG 24 hr tablet, Take 1 tablet (25 mg total) by mouth at bedtime., Disp: 90 tablet, Rfl: 0   pantoprazole (PROTONIX) 40 MG tablet, Take 1 tablet (40 mg total) by mouth 2 (two) times daily., Disp: 60 tablet, Rfl: 3   Potassium Chloride ER 20 MEQ TBCR, Take 20 mEq by mouth daily., Disp: , Rfl:    potassium chloride SA (KLOR-CON M20) 20 MEQ tablet, Take 1 tablet (20 mEq total) by mouth daily as needed. Take as needed with Lasix., Disp: 90 tablet, Rfl: 3   sildenafil (VIAGRA) 100 MG tablet, Take 1 tablet (100 mg total) by mouth as needed for erectile dysfunction. TAKE 1 (ONE) TABLET AS NEEDED. DO NOT USE AT SAME TIME AS NITRO, Disp: 30 tablet, Rfl: 3  Past Medical History: Past Medical History:  Diagnosis Date   Acute MI anterior wall first episode care (HCC) 07/13/2017   Occluded ostial LAD S/P 3.5x26 Onyx DES. RI 80%. RCA Occluded at prior stent site with collaterals    Cardiogenic shock (HCC) 07/13/2017   CHF (congestive heart failure) (HCC)    Graves disease    Hypertension    MI (myocardial infarction) (HCC)    Thyroid disease    thyroid irradiated per pt    Tobacco Use: Social History  Tobacco Use  Smoking Status Former   Current packs/day: 0.00   Average packs/day: 0.5 packs/day for 20.0 years (10.0 ttl pk-yrs)   Types: Cigarettes   Start date: 07/13/1997   Quit date: 07/13/2017   Years since quitting: 6.3  Smokeless Tobacco Never  Tobacco Comments   quit 2012    Labs: Review Flowsheet  More data exists      Latest Ref Rng & Units 07/26/2022 06/13/2023 06/22/2023 06/23/2023 06/25/2023  Labs for ITP Cardiac and Pulmonary Rehab  Cholestrol 0 - 200 mg/dL 74  71  - - -  LDL (calc) 0 - 99 mg/dL 24  18  - - -  HDL-C >82.95 mg/dL 39  62.13  - - -  Trlycerides 0.0 - 149.0 mg/dL 54  08.6  - - -  Hemoglobin A1c 4.8 - 5.6 % 6.3  6.5  - 6.4  -  PH, Arterial 7.35 - 7.45 - - 7.383  - 7.287  7.418   7.345  7.326  7.402  7.313   PCO2 arterial 32 - 48 mmHg - - 37.6  - 44.6  32.4  39.2  43.2  38.4  44.5   Bicarbonate 20.0 - 28.0 mmol/L - - 24.9  22.4  - 21.4  21.1  21.7  22.6  23.9  22.5   TCO2 22 - 32 mmol/L - - 26  24  - 23  22  23  23  24  26  25  25  24  29    Acid-base deficit 0.0 - 2.0 mmol/L - - 1.0  2.0  - 5.0  3.0  4.0  3.0  1.0  4.0   O2 Saturation % - - 71  99  - 99  98  96  97  100  100     Details       Multiple values from one day are sorted in reverse-chronological order          Exercise Target Goals: Exercise Program Goal: Individual exercise prescription set using results from initial 6 min walk test and THRR while considering  patient's activity barriers and safety.   Exercise Prescription Goal: Initial exercise prescription builds to 30-45 minutes a day of aerobic activity, 2-3 days per week.  Home exercise guidelines will be given to patient during program as part of exercise prescription that the participant will acknowledge.   Education: Aerobic Exercise: - Group verbal and visual presentation on the components of exercise prescription. Introduces F.I.T.T principle from ACSM for exercise prescriptions.  Reviews F.I.T.T. principles of aerobic exercise including progression. Written material given at graduation.   Education: Resistance Exercise: - Group verbal and visual presentation on the components of exercise prescription. Introduces F.I.T.T principle from ACSM for exercise prescriptions  Reviews F.I.T.T. principles of resistance exercise including progression. Written material given at graduation.    Education: Exercise & Equipment Safety: - Individual verbal instruction and demonstration of equipment use and safety with use of the equipment. Flowsheet Row Cardiac Rehab from 08/22/2023 in [facility] Cardiac and Pulmonary Rehab  Date 08/22/23  Educator [facility]  Instruction Review Code 1- Verbalizes Understanding       Education: Exercise Physiology & General  Exercise Guidelines: - Group verbal and written instruction with models to review the exercise physiology of the cardiovascular system and associated critical values. Provides general exercise guidelines with specific guidelines to those with heart or lung disease.  Flowsheet Row Cardiac Rehab from 11/12/2017 in [facility] Cardiac and Pulmonary Rehab  Date 10/22/17  Educator [employer]  Instruction Review Code 1- Tax inspector       Education: Flexibility, Balance, Mind/Body Relaxation: - Group verbal and visual presentation with interactive activity on the components of exercise prescription. Introduces F.I.T.T principle from ACSM for exercise prescriptions. Reviews F.I.T.T. principles of flexibility and balance exercise training including progression. Also discusses the mind body connection.  Reviews various relaxation techniques to help reduce and manage stress (i.e. Deep breathing, progressive muscle relaxation, and visualization). Balance handout provided to take home. Written material given at graduation.   Activity Barriers & Risk Stratification:  Activity Barriers & Cardiac Risk Stratification - 08/22/23 1558       Activity Barriers & Cardiac Risk Stratification   Activity Barriers Back Problems;Incisional Pain    Cardiac Risk Stratification High             6 Minute Walk:  6 Minute Walk     Row Name 08/22/23 1555         6 Minute Walk   Phase Initial     Distance 1330 feet     Walk Time 6 minutes     # of Rest Breaks 0     MPH 2.52     METS 3.76     RPE 9     Perceived Dyspnea  0     VO2 Peak 13.14     Symptoms No     Resting HR 74 bpm     Resting BP 112/72     Resting Oxygen Saturation  96 %     Exercise Oxygen Saturation  during 6 min walk 98 %     Max Ex. HR 97 bpm     Max Ex. BP 122/70     2 Minute Post BP 104/62              Oxygen Initial Assessment:   Oxygen Re-Evaluation:   Oxygen Discharge (Final Oxygen Re-Evaluation):   Initial  Exercise Prescription:  Initial Exercise Prescription - 08/22/23 1500       Date of Initial Exercise RX and Referring Provider   Date 08/22/23    Referring Provider Gollan      Oxygen   Maintain Oxygen Saturation 88% or higher      Treadmill   MPH 3    Grade 1    Minutes 15    METs 3.71      NuStep   Level 2   T6   SPM 80    Minutes 15    METs 3.76      Elliptical   Level 1    Speed 3    Minutes 15    METs 3.76      Prescription Details   Frequency (times per week) 3    Duration Progress to 30 minutes of continuous aerobic without [name]/symptoms of physical distress      Intensity   THRR 40-80% of Max Heartrate 111-148    Ratings of Perceived Exertion 11-13    Perceived Dyspnea 0-4      Progression   Progression Continue to progress workloads to maintain intensity without [name]/symptoms of physical distress.      Resistance Training   Training Prescription Yes    Weight 5    Reps 10-15             Perform Capillary Blood Glucose checks as needed.  Exercise Prescription Changes:   Exercise Prescription Changes     Row Name 08/22/23 1500 09/11/23 1500 09/24/23 1500 10/10/23  0700 10/24/23 0900     Response to Exercise   Blood Pressure (Admit) 112/72 104/60 132/82 102/60 110/62   Blood Pressure (Exercise) 122/70 148/68 152/76 152/70 --   Blood Pressure (Exit) 104/62 98/58 102/60 102/60 92/58   Heart Rate (Admit) 74 bpm 72 bpm 72 bpm 79 bpm 77 bpm   Heart Rate (Exercise) 97 bpm 137 bpm 128 bpm 131 bpm 141 bpm   Heart Rate (Exit) 73 bpm 94 bpm 89 bpm 95 bpm 71 bpm   Oxygen Saturation (Admit) 96 % -- -- -- --   Oxygen Saturation (Exercise) 98 % -- -- -- --   Oxygen Saturation (Exit) 98 % -- -- -- --   Rating of Perceived Exertion (Exercise) 9 15 13 15 16    Perceived Dyspnea (Exercise) 0 0 -- -- --   Symptoms none none none none none   Comments 6 MWT results -- -- -- --   Duration -- Progress to 30 minutes of  aerobic without [name]/symptoms of  physical distress Continue with 30 min of aerobic exercise without [name]/symptoms of physical distress. Continue with 30 min of aerobic exercise without [name]/symptoms of physical distress. Continue with 30 min of aerobic exercise without [name]/symptoms of physical distress.   Intensity -- THRR unchanged THRR unchanged THRR unchanged THRR unchanged     Progression   Progression -- Continue to progress workloads to maintain intensity without [name]/symptoms of physical distress. Continue to progress workloads to maintain intensity without [name]/symptoms of physical distress. Continue to progress workloads to maintain intensity without [name]/symptoms of physical distress. Continue to progress workloads to maintain intensity without [name]/symptoms of physical distress.   Average METs -- 4.22 3.91 5.73 5.22     Resistance Training   Training Prescription -- Yes Yes Yes Yes   Weight -- 5 5 lb 5 lb 10 lb   Reps -- 10-15 10-15 10-15 10-15     Interval Training   Interval Training -- No No No No     Treadmill   MPH -- 3 3 2.8 3.3   Grade -- 4 4 9 6    Minutes -- 15 15 15 15    METs -- 4.95 4.95 6.62 0.25     NuStep   Level -- 2  T6 4  T6 nustep -- --   Minutes -- 15 15 -- --   METs -- 3.2 2.2 -- --     Elliptical   Level -- 2 5 5 13    Speed -- 3 4.5 4.5 4.5   Minutes -- 15 15 15 15    METs -- 5.4 -- 5.5 7.5     Oxygen   Maintain Oxygen Saturation -- 88% or higher 88% or higher 88% or higher 88% or higher    Row Name 11/08/23 1400             Response to Exercise   Blood Pressure (Admit) 140/80       Blood Pressure (Exit) 98/58       Heart Rate (Admit) 78 bpm       Heart Rate (Exercise) 132 bpm       Heart Rate (Exit) 93 bpm       Rating of Perceived Exertion (Exercise) 14       Perceived Dyspnea (Exercise) 0       Symptoms none       Duration Continue with 30 min of aerobic exercise without [name]/symptoms of physical distress.       Intensity THRR unchanged  Progression   Progression Continue to progress workloads to maintain intensity without [name]/symptoms of physical distress.       Average METs 5.99         Resistance Training   Training Prescription Yes       Weight 10 lb       Reps 10-15         Interval Training   Interval Training No         Treadmill   MPH 3       Grade 6       Minutes 15       METs 5.78         Elliptical   Level 10       Speed 3.1       Minutes 15       METs 6.2         Oxygen   Maintain Oxygen Saturation 88% or higher                Exercise Comments:   Exercise Comments     Row Name 08/29/23 1541           Exercise Comments First full day of exercise!  Patient was oriented to gym and equipment including functions, settings, policies, and procedures.  Patient's individual exercise prescription and treatment plan were reviewed.  All starting workloads were established based on the results of the 6 minute walk test done at initial orientation visit.  The plan for exercise progression was also introduced and progression will be customized based on patient's performance and goals.                Exercise Goals and Review:   Exercise Goals     Row Name 08/22/23 1602             Exercise Goals   Increase Physical Activity Yes       Intervention Provide advice, education, support and counseling about physical activity/exercise needs.;Develop an individualized exercise prescription for aerobic and resistive training based on initial evaluation findings, risk stratification, comorbidities and participant's personal goals.       Expected Outcomes Short Term: Attend rehab on a regular basis to increase amount of physical activity.;Long Term: Add in home exercise to make exercise part of routine and to increase amount of physical activity.;Long Term: Exercising regularly at least 3-5 days a week.       Increase Strength and Stamina Yes       Intervention Provide advice, education, support  and counseling about physical activity/exercise needs.;Develop an individualized exercise prescription for aerobic and resistive training based on initial evaluation findings, risk stratification, comorbidities and participant's personal goals.       Expected Outcomes Short Term: Increase workloads from initial exercise prescription for resistance, speed, and METs.;Short Term: Perform resistance training exercises routinely during rehab and add in resistance training at home;Long Term: Improve cardiorespiratory fitness, muscular endurance and strength as measured by increased METs and functional capacity ( )       Able to understand and use rate of perceived exertion (RPE) scale Yes       Intervention Provide education and explanation on how to use RPE scale       Expected Outcomes Short Term: Able to use RPE daily in rehab to express subjective intensity level;Long Term:  Able to use RPE to guide intensity level when exercising independently       Able to understand and use Dyspnea scale Yes  Intervention Provide education and explanation on how to use Dyspnea scale       Expected Outcomes Short Term: Able to use Dyspnea scale daily in rehab to express subjective sense of shortness of breath during exertion;Long Term: Able to use Dyspnea scale to guide intensity level when exercising independently       Knowledge and understanding of Target Heart Rate Range (THRR) Yes       Intervention Provide education and explanation of THRR including how the numbers were predicted and where they are located for reference       Expected Outcomes Short Term: Able to state/look up THRR;Long Term: Able to use THRR to govern intensity when exercising independently;Short Term: Able to use daily as guideline for intensity in rehab       Able to check pulse independently Yes       Intervention Provide education and demonstration on how to check pulse in carotid and radial arteries.;Review the importance of being  able to check your own pulse for safety during independent exercise       Expected Outcomes Short Term: Able to explain why pulse checking is important during independent exercise;Long Term: Able to check pulse independently and accurately       Understanding of Exercise Prescription Yes       Intervention Provide education, explanation, and written materials on patient's individual exercise prescription       Expected Outcomes Short Term: Able to explain program exercise prescription;Long Term: Able to explain home exercise prescription to exercise independently                Exercise Goals Re-Evaluation :  Exercise Goals Re-Evaluation     Row Name 08/29/23 1546 09/11/23 1548 09/24/23 1522 10/10/23 0754 10/24/23 0936     Exercise Goal Re-Evaluation   Exercise Goals Review Increase Physical Activity;Able to understand and use rate of perceived exertion (RPE) scale;Knowledge and understanding of Target Heart Rate Range (THRR);Increase Strength and Stamina;Able to check pulse independently;Understanding of Exercise Prescription Increase Physical Activity;Increase Strength and Stamina;Understanding of Exercise Prescription Increase Physical Activity;Increase Strength and Stamina;Understanding of Exercise Prescription Increase Physical Activity;Increase Strength and Stamina;Understanding of Exercise Prescription Increase Physical Activity;Increase Strength and Stamina;Understanding of Exercise Prescription   Comments Reviewed RPE  and dyspnea scale, THR and program prescription with pt today.  Pt voiced understanding and was given a copy of goals to take home. Bobby [name] has completed his first 4 sessions during this review, and continues to get familiar with the program. He recently increased his grade on the treamill from 1% to 4%. He also increased his elliptical level from 1 to 2. We will continue to monitor his progress in the program. Bobby [name] continues to do well in rehab. He has continued to walk the  treadmill at a speed of 3 mph and an incline of 4%. He also improved to level 5 on the elliptical and level 4 on the T6 nustep. We will continue to monitor his progress in the program. Bobby [name] has only attended two sessions since the last review. However, he was able to increase his treadmill workload up to a speed of 2.8 mph with a 9% incline. He also continues to work at level 5 on the elliptical and use 5 lb hand weights for resistance training. We will continue to monitor his progress in the program. Bobby [name] continues to do well in rehab. He recently increased his treadmill workload to a speed of 3.3 mph while maintaining an incline of 6%. He  also increased from level 5 to level 13 on the elliptical. He increased to 10 lb hand weights for resistance training as well. We will continue to monitor his progress in the program.   Expected Outcomes Short: Use RPE daily to regulate intensity.  Long: Follow program prescription in THR. Short: Continue to follow current exercise prescription, and progressively increase workloads. Long: Continue exercise to improve strength and stamina. Short: Begin to progressively increase treadmill workload. Long: Continue exercise to improve strength and stamina. Short: Attend rehab more consistently. Long: Continue exercise to improve strength and stamina. Short: Continue to progressively increase treadmill workload. Long: Continue exercise to improve strength and stamina.    Row Name 11/08/23 1436             Exercise Goal Re-Evaluation   Exercise Goals Review Increase Physical Activity;Increase Strength and Stamina;Understanding of Exercise Prescription       Comments Bobby [name] continues to do well in rehab. He was only able to attend one rehab sesison during this review. During this one session he decreased both his level on the elliptical and his speed on the treadmill. We will encourage him to increase to previous workloads, and monitor his progress.       Expected Outcomes  Short: Continue to progressively increase treadmill workload. Long: Continue exercise to improve strength and stamina.                Discharge Exercise Prescription (Final Exercise Prescription Changes):  Exercise Prescription Changes - 11/08/23 1400       Response to Exercise   Blood Pressure (Admit) 140/80    Blood Pressure (Exit) 98/58    Heart Rate (Admit) 78 bpm    Heart Rate (Exercise) 132 bpm    Heart Rate (Exit) 93 bpm    Rating of Perceived Exertion (Exercise) 14    Perceived Dyspnea (Exercise) 0    Symptoms none    Duration Continue with 30 min of aerobic exercise without [name]/symptoms of physical distress.    Intensity THRR unchanged      Progression   Progression Continue to progress workloads to maintain intensity without [name]/symptoms of physical distress.    Average METs 5.99      Resistance Training   Training Prescription Yes    Weight 10 lb    Reps 10-15      Interval Training   Interval Training No      Treadmill   MPH 3    Grade 6    Minutes 15    METs 5.78      Elliptical   Level 10    Speed 3.1    Minutes 15    METs 6.2      Oxygen   Maintain Oxygen Saturation 88% or higher             Nutrition:  Target Goals: Understanding of nutrition guidelines, daily intake of sodium 1500mg , cholesterol 200mg , calories 30% from fat and 7% or less from saturated fats, daily to have 5 or more servings of fruits and vegetables.  Education: All About Nutrition: -Group instruction provided by verbal, written material, interactive activities, discussions, models, and posters to present general guidelines for heart healthy nutrition including fat, fiber, MyPlate, the role of sodium in heart healthy nutrition, utilization of the nutrition label, and utilization of this knowledge for meal planning. Follow up email sent as well. Written material given at graduation. Flowsheet Row Cardiac Rehab from 11/12/2017 in [facility] Cardiac and Pulmonary Rehab   Date  10/08/17  Educator PI  Instruction Review Code 1- Verbalizes Understanding       Biometrics:  Pre Biometrics - 08/22/23 1606       Pre Biometrics   Height 6\' 1"  (1.854 m)    Weight 221 lb 12.8 oz (100.6 kg)    Waist Circumference 42 inches    Hip Circumference 41.5 inches    Waist to Hip Ratio 1.01 %    BMI (Calculated) 29.27    Single Leg Stand 30 seconds              Nutrition Therapy Plan and Nutrition Goals:  Nutrition Therapy & Goals - 08/22/23 1607       Intervention Plan   Intervention Prescribe, educate and counsel regarding individualized specific dietary modifications aiming towards targeted core components such as weight, hypertension, lipid management, diabetes, heart failure and other comorbidities.    Expected Outcomes Short Term Goal: Understand basic principles of dietary content, such as calories, fat, sodium, cholesterol and nutrients.;Short Term Goal: A plan has been developed with personal nutrition goals set during dietitian appointment.;Long Term Goal: Adherence to prescribed nutrition plan.             Nutrition Assessments:  MEDIFICTS Score Key: >=70 Need to make dietary changes  40-70 Heart Healthy Diet <= 40 Therapeutic Level Cholesterol Diet  Flowsheet Row Cardiac Rehab from 08/22/2023 in [facility] Cardiac and Pulmonary Rehab  Picture Your Plate Total Score on Admission 69      Picture Your Plate Scores: <82 Unhealthy dietary pattern with much room for improvement. 41-50 Dietary pattern unlikely to meet recommendations for good health and room for improvement. 51-60 More healthful dietary pattern, with some room for improvement.  >60 Healthy dietary pattern, although there may be some specific behaviors that could be improved.    Nutrition Goals Re-Evaluation:  Nutrition Goals Re-Evaluation     Row Name 09/06/23 1555 10/15/23 1541 11/07/23 1546         Goals   Current Weight 216 lb (98 kg) -- --     Comment Bobby [name] defers  dietitian appointment. Bobby [name] defers dietitian appointment. Bobby [name] defers dietitian appointment.              Nutrition Goals Discharge (Final Nutrition Goals Re-Evaluation):  Nutrition Goals Re-Evaluation - 11/07/23 1546       Goals   Comment Bobby [name] defers dietitian appointment.             Psychosocial: Target Goals: Acknowledge presence or absence of significant depression and/or stress, maximize coping skills, provide positive support system. Participant is able to verbalize types and ability to use techniques and skills needed for reducing stress and depression.   Education: Stress, Anxiety, and Depression - Group verbal and visual presentation to define topics covered.  Reviews how body is impacted by stress, anxiety, and depression.  Also discusses healthy ways to reduce stress and to treat/manage anxiety and depression.  Written material given at graduation.   Education: Sleep Hygiene -Provides group verbal and written instruction about how sleep can affect your health.  Define sleep hygiene, discuss sleep cycles and impact of sleep habits. Review good sleep hygiene tips.    Initial Review & Psychosocial Screening:  Initial Psych Review & Screening - 08/07/23 1309       Initial Review   Current issues with Current Sleep Concerns      Family Dynamics   Good Support System? Yes   wife     Barriers   Psychosocial barriers  to participate in program There are no identifiable barriers or psychosocial needs.;The patient should benefit from training in stress management and relaxation.      Screening Interventions   Expected Outcomes Short Term goal: Utilizing psychosocial counselor, staff and physician to assist with identification of specific Stressors or current issues interfering with healing process. Setting desired goal for each stressor or current issue identified.;Short Term goal: Identification and review with participant of any Quality of Life or Depression concerns  found by scoring the questionnaire.;Long Term Goal: Stressors or current issues are controlled or eliminated.;Long Term goal: The participant improves quality of Life and PHQ9 Scores as seen by post scores and/or verbalization of changes             Quality of Life Scores:   Quality of Life - 08/22/23 1610       Quality of Life   Select Quality of Life      Quality of Life Scores   Health/Function Pre 16.27 %    Socioeconomic Pre 17.44 %    Psych/Spiritual Pre 16.93 %    Family Pre 17.3 %    GLOBAL Pre 16.81 %            Scores of 19 and below usually indicate a poorer quality of life in these areas.  A difference of  2-3 points is a clinically meaningful difference.  A difference of 2-3 points in the total score of the Quality of Life Index has been associated with significant improvement in overall quality of life, self-image, physical symptoms, and general health in studies assessing change in quality of life.  PHQ-9: Review Flowsheet  More data exists      09/06/2023 08/22/2023 06/20/2023 05/23/2023 10/15/2017  Depression screen PHQ 2/9  Decreased Interest 0 1 1 1 1   Down, Depressed, Hopeless 0 1 1 1  0  PHQ - 2 Score 0 2 2 2 1   Altered sleeping 1 2 1 2  0  Tired, decreased energy 1 1 1 3 1   Change in appetite 0 0 1 3 0  Feeling bad or failure about yourself  0 0 1 0 0  Trouble concentrating 2 2 1 3 1   Moving slowly or fidgety/restless 0 0 1 0 0  Suicidal thoughts 0 0 0 0 0  PHQ-9 Score 4 7 8 13 3   Difficult doing work/chores Somewhat difficult Somewhat difficult Somewhat difficult Somewhat difficult -    Details           Interpretation of Total Score  Total Score Depression Severity:  1-4 = Minimal depression, 5-9 = Mild depression, 10-14 = Moderate depression, 15-19 = Moderately severe depression, 20-27 = Severe depression   Psychosocial Evaluation and Intervention:  Psychosocial Evaluation - 08/07/23 1325       Psychosocial Evaluation & Interventions    Interventions Encouraged to exercise with the program and follow exercise prescription    Comments Bobby [name] is coming to 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 He did the program in 2018 after an MI. He states he doesn't feel stressed currenlty, just trying to be patient with the healing process. 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 His wife and dog are his main support system. He states his sleep has been horrible since surgery and sometimes he takes a Tylenol PM. He thinks it is slowly getting better, but he is up many times a night. His sternum incision did open  and require debridement, he is almost done doing wet to dry dressing. He wants to work on his stamina and strength. He was supposed to participate in a [employer] race, but is unable to this year. His goal is to now be able to participate in this next year.    Expected Outcomes Short: attend cardiac rehab for education and exercise. Long: develop and maintain positive self care    Continue Psychosocial Services  Follow up required by staff             Psychosocial Re-Evaluation:  Psychosocial Re-Evaluation     Row Name 09/06/23 1557 10/15/23 1538 11/07/23 1547         Psychosocial Re-Evaluation   Current issues with Current Stress Concerns None Identified Current Sleep Concerns     Comments Reviewed patient health questionnaire (PHQ-9) with patient for follow up. Previously, patients score indicated [name]/symptoms of depression.  Reviewed to see if patient is improving symptom wise while in program.  Score improved and patient states that it is because he has good support at home and his health has been getting better. Patient reports no issues with their current mental states, sleep, stress, depression or anxiety. Will follow up with patient in a few weeks for any changes. Patient reports no issues with their current mental states, stress, depression or anxiety. Will follow up with  patient in a few weeks for any changes. Bobby [name] has been having trouble staying asleep. He takes meletonin and is going to talk to his doctor tomorrow about it.     Expected Outcomes Short: Continue to attend [facility] regularly for regular exercise and social engagement. Long: Continue to improve symptoms and manage a positive mental state. Short: Continue to exercise regularly to support mental health and notify staff of any changes. Long: maintain mental health and well being through teaching of rehab or prescribed medications independently. Short: Continue to exercise regularly to support mental health and notify staff of any changes, meet with doctor about sleep. Long: maintain mental health and well being through teaching of rehab or prescribed medications independently. and maintain sleep habits.     Interventions Encouraged to attend Cardiac Rehabilitation for the exercise Encouraged to attend Cardiac Rehabilitation for the exercise Encouraged to attend Cardiac Rehabilitation for the exercise     Continue Psychosocial Services  Follow up required by staff Follow up required by staff Follow up required by staff              Psychosocial Discharge (Final Psychosocial Re-Evaluation):  Psychosocial Re-Evaluation - 11/07/23 1547       Psychosocial Re-Evaluation   Current issues with Current Sleep Concerns    Comments Patient reports no issues with their current mental states, stress, depression or anxiety. Will follow up with patient in a few weeks for any changes. Bobby [name] has been having trouble staying asleep. He takes meletonin and is going to talk to his doctor tomorrow about it.    Expected Outcomes Short: Continue to exercise regularly to support mental health and notify staff of any changes, meet with doctor about sleep. Long: maintain mental health and well being through teaching of rehab or prescribed medications independently. and maintain sleep habits.    Interventions Encouraged to  attend Cardiac Rehabilitation for the exercise    Continue Psychosocial Services  Follow up required by staff             Vocational Rehabilitation: Provide vocational rehab assistance to qualifying candidates.   Vocational Rehab  Evaluation & Intervention:  Vocational Rehab - 08/07/23 1308       Initial Vocational Rehab Evaluation & Intervention   Assessment shows need for Vocational Rehabilitation No             Education: Education Goals: Education classes will be provided on a variety of topics geared toward better understanding of heart health and risk factor modification. Participant will state understanding/return demonstration of topics presented as noted by education test scores.  Learning Barriers/Preferences:  Learning Barriers/Preferences - 08/07/23 1308       Learning Barriers/Preferences   Learning Barriers None    Learning Preferences Individual Instruction             General Cardiac Education Topics:  AED/CPR: - Group verbal and written instruction with the use of models to demonstrate the basic use of the AED with the basic ABC's of resuscitation.   Anatomy and Cardiac Procedures: - Group verbal and visual presentation and models provide information about basic cardiac anatomy and function. Reviews the testing methods done to diagnose heart disease and the outcomes of the test results. Describes the treatment choices: Medical Management, Angioplasty, or Coronary Bypass Surgery for treating various heart conditions including Myocardial Infarction, Angina, Valve Disease, and Cardiac Arrhythmias.  Written material given at graduation. Flowsheet Row Cardiac Rehab from 08/22/2023 in [facility] Cardiac and Pulmonary Rehab  Education need identified 08/22/23       Medication Safety: - Group verbal and visual instruction to review commonly prescribed medications for heart and lung disease. Reviews the medication, class of the drug, and side effects. Includes  the steps to properly store meds and maintain the prescription regimen.  Written material given at graduation.   Intimacy: - Group verbal instruction through game format to discuss how heart and lung disease can affect sexual intimacy. Written material given at graduation..   Know Your Numbers and Heart Failure: - Group verbal and visual instruction to discuss disease risk factors for cardiac and pulmonary disease and treatment options.  Reviews associated critical values for Overweight/Obesity, Hypertension, Cholesterol, and Diabetes.  Discusses basics of heart failure: [name]/symptoms and treatments.  Introduces Heart Failure Zone chart for action plan for heart failure.  Written material given at graduation. Flowsheet Row Cardiac Rehab from 11/12/2017 in [facility] Cardiac and Pulmonary Rehab  Date 11/05/17  Educator [facility]  Instruction Review Code 1- Verbalizes Understanding       Infection Prevention: - Provides verbal and written material to individual with discussion of infection control including proper hand washing and proper equipment cleaning during exercise session. Flowsheet Row Cardiac Rehab from 08/22/2023 in [facility] Cardiac and Pulmonary Rehab  Date 08/22/23  Educator [facility]  Instruction Review Code 1- Verbalizes Understanding       Falls Prevention: - Provides verbal and written material to individual with discussion of falls prevention and safety. Flowsheet Row Cardiac Rehab from 08/22/2023 in [facility] Cardiac and Pulmonary Rehab  Date 08/22/23  Educator [facility]  Instruction Review Code 1- Verbalizes Understanding       Other: -Provides group and verbal instruction on various topics (see comments) Flowsheet Row Cardiac Rehab from 11/12/2017 in [facility] Cardiac and Pulmonary Rehab  Date 10/31/17  [Risk Factors and Know Your Numbers]  Educator [facility]  Instruction Review Code 1- Verbalizes Understanding       Knowledge Questionnaire Score:  Knowledge Questionnaire Score - 08/22/23 1611        Knowledge Questionnaire Score   Pre Score 25/26  Core Components/Risk Factors/Patient Goals at Admission:  Personal Goals and Risk Factors at Admission - 08/22/23 1607       Core Components/Risk Factors/Patient Goals on Admission    Weight Management Yes;Weight Loss    Intervention Weight Management: Develop a combined nutrition and exercise program designed to reach desired caloric intake, while maintaining appropriate intake of nutrient and fiber, sodium and fats, and appropriate energy expenditure required for the weight goal.;Weight Management: Provide education and appropriate resources to help participant work on and attain dietary goals.;Weight Management/Obesity: Establish reasonable short term and long term weight goals.;Obesity: Provide education and appropriate resources to help participant work on and attain dietary goals.    Admit Weight 221 lb 12.8 oz (100.6 kg)    Goal Weight: Short Term 215 lb (97.5 kg)    Goal Weight: Long Term 210 lb (95.3 kg)    Expected Outcomes Short Term: Continue to assess and modify interventions until short term weight is achieved;Long Term: Adherence to nutrition and physical activity/exercise program aimed toward attainment of established weight goal;Weight Loss: Understanding of general recommendations for a balanced deficit meal plan, which promotes 1-2 lb weight loss per week and includes a negative energy balance of [phone] kcal/d;Understanding recommendations for meals to include 15-35% energy as protein, 25-35% energy from fat, 35-60% energy from carbohydrates, less than 200mg  of dietary cholesterol, 20-35 gm of total fiber daily;Understanding of distribution of calorie intake throughout the day with the consumption of 4-5 meals/snacks    Hypertension Yes    Intervention Provide education on lifestyle modifcations including regular physical activity/exercise, weight management, moderate sodium restriction and increased  consumption of fresh fruit, vegetables, and low fat dairy, alcohol moderation, and smoking cessation.;Monitor prescription use compliance.    Expected Outcomes Short Term: Continued assessment and intervention until BP is < 140/13mm HG in hypertensive participants. < 130/34mm HG in hypertensive participants with diabetes, heart failure or chronic kidney disease.;Long Term: Maintenance of blood pressure at goal levels.    Lipids Yes    Intervention Provide education and support for participant on nutrition & aerobic/resistive exercise along with prescribed medications to achieve LDL 70mg , HDL >40mg .    Expected Outcomes Short Term: Participant states understanding of desired cholesterol values and is compliant with medications prescribed. Participant is following exercise prescription and nutrition guidelines.;Long Term: Cholesterol controlled with medications as prescribed, with individualized exercise RX and with personalized nutrition plan. Value goals: LDL < 70mg , HDL > 40 mg.             Education:Diabetes - Individual verbal and written instruction to review [name]/symptoms of diabetes, desired ranges of glucose level fasting, after meals and with exercise. Acknowledge that pre and post exercise glucose checks will be done for 3 sessions at entry of program.   Core Components/Risk Factors/Patient Goals Review:   Goals and Risk Factor Review     Row Name 09/06/23 1553 10/15/23 1541 11/07/23 1543         Core Components/Risk Factors/Patient Goals Review   Personal Goals Review Weight Management/Obesity;Other Weight Management/Obesity;Other Other     Review Bobby [name] state that his weight has gone down. He has started taking Lasix and it is helping him breath better and get that weight off. He wants to reach a weight goal below 210 pounds. Bobby [name] was 221 pounds when he started rehab. He is now weighing 215 pounds. He would like to get to 210 pounds. Bobby [name] has been increasing his loads in class as  needed and is working to [employer]  his breathing and fluid retention. He takes lasix 20mg  as day. He can take an extra lasix if his weight is up. Bobby [name] had a Zio heart monitor and is done with the test. He sent it back on 11/05/2023. He states that his chest/sternal incision has come open slightly. Doctors informed him to use antibiotics and to check back with them on Friday.     Expected Outcomes Short: lose a few pounds in a couple weeks. Long: reach weight. Short: lose weight and maintain fluid. Long: reach weight goal and keep weight off independently. Short: meet with doctors about Incision. Long: maintain healing of incision.              Core Components/Risk Factors/Patient Goals at Discharge (Final Review):   Goals and Risk Factor Review - 11/07/23 1543       Core Components/Risk Factors/Patient Goals Review   Personal Goals Review Other    Review Bobby [name] had a Zio heart monitor and is done with the test. He sent it back on 11/05/2023. He states that his chest/sternal incision has come open slightly. Doctors informed him to use antibiotics and to check back with them on Friday.    Expected Outcomes Short: meet with doctors about Incision. Long: maintain healing of incision.             ITP Comments:  ITP Comments     Row Name 08/07/23 1323 08/22/23 1555 08/29/23 1104 08/29/23 1540 09/26/23 1228   ITP Comments Initial phone call completed. Diagnosis can be found in [facility] 6/28. EP Orientation scheduled for Wednesday 8/28 at 2:30. Completed and gym orientation. Initial ITP created and sent for review to Dr. Bethann Punches, Medical Director. 30 Day review completed. Medical Director ITP review done, changes made as directed, and signed approval by Medical Director.     new to program First full day of exercise!  Patient was oriented to gym and equipment including functions, settings, policies, and procedures.  Patient's individual exercise prescription and treatment plan were reviewed.  All  starting workloads were established based on the results of the 6 minute walk test done at initial orientation visit.  The plan for exercise progression was also introduced and progression will be customized based on patient's performance and goals. 30 Day review completed. Medical Director ITP review done, changes made as directed, and signed approval by Medical Director.    new to program    Row Name 10/03/23 1115 10/24/23 1515 11/14/23 1113       ITP Comments Bobby [name] called to tell us his Apple watch has shown several times that he was in atrial fib. He has been sick all week. He called to ask if he should come to CR. Advised him to call his doctor and review his illness, the Apple watch findings and see if he should be seen for further evaluation.  Will cancel today's visit and see what his doctor says about continuing CR with afib. 30 Day review completed. Medical Director ITP review done, changes made as directed, and signed approval by Medical Director. 30 Day review completed. Medical Director ITP review done, changes made as directed, and signed approval by Medical Director.              Comments:

## 2023-11-15 ENCOUNTER — Encounter: Payer: 59 | Admitting: *Deleted

## 2023-11-15 DIAGNOSIS — Z951 Presence of aortocoronary bypass graft: Secondary | ICD-10-CM

## 2023-11-15 NOTE — Progress Notes (Signed)
Daily Session Note  Patient Details  Name: Bobby [name] MRN: [id number] Date of Birth: 25-Jun-1971 Referring Provider:   Flowsheet Row Cardiac Rehab from 08/22/2023 in [facility] Cardiac and Pulmonary Rehab  Referring Provider Gollan       Encounter Date: 11/15/2023  Check In:  Session Check In - 11/15/23 1541       Check-In   Supervising physician immediately available to respond to emergencies See telemetry face sheet for immediately available ER MD    Location [facility]-Cardiac & Pulmonary Rehab    Staff Present Maxon Suzzette Righter, , Exercise Physiologist;Stephania Macfarlane Jewel Baize, RN BSN;Joseph Hood, RCP,RRT,BSRT;Noah Tickle, [place], Exercise Physiologist    Virtual Visit No    Medication changes reported     No    Fall or balance concerns reported    No    Warm-up and Cool-down Performed on first and last piece of equipment    Resistance Training Performed Yes    VAD Patient? No    PAD/SET Patient? No      Pain Assessment   Currently in Pain? No/denies                Social History   Tobacco Use  Smoking Status Former   Current packs/day: 0.00   Average packs/day: 0.5 packs/day for 20.0 years (10.0 ttl pk-yrs)   Types: Cigarettes   Start date: 07/13/1997   Quit date: 07/13/2017   Years since quitting: 6.3  Smokeless Tobacco Never  Tobacco Comments   quit 2012    Goals Met:  Independence with exercise equipment Exercise tolerated well No report of concerns or symptoms today Strength training completed today  Goals Unmet:  Not Applicable  Comments: Pt able to follow exercise prescription today without complaint.  Will continue to monitor for progression.    Dr. Bethann Punches is Medical Director for [facility] Cardiac Rehabilitation.  Dr. Vida Rigger is Medical Director for [facility] Pulmonary Rehabilitation.

## 2023-11-16 DIAGNOSIS — T8149XA Infection following a procedure, other surgical site, initial encounter: Secondary | ICD-10-CM | POA: Insufficient documentation

## 2023-11-16 DIAGNOSIS — G4700 Insomnia, unspecified: Secondary | ICD-10-CM | POA: Insufficient documentation

## 2023-11-16 DIAGNOSIS — G479 Sleep disorder, unspecified: Secondary | ICD-10-CM | POA: Insufficient documentation

## 2023-11-16 HISTORY — DX: Infection following a procedure, other surgical site, initial encounter: T81.49XA

## 2023-11-16 NOTE — Assessment & Plan Note (Signed)
Patient recently switched from carvedilol to metoprolol 25mg  due to blood pressure concerns and rhythm issues. -Continue metoprolol 25mg . -Continue Eliquis 5 mg BID -Continue to follow up with Cardiology

## 2023-11-16 NOTE — Assessment & Plan Note (Signed)
Patient on Keflex for reopened chest wound post open-heart surgery. -Continue Keflex as prescribed. -Follow up with Cardiothoracic surgery as scheduled

## 2023-11-16 NOTE — Assessment & Plan Note (Signed)
Patient on atorvastatin 20mg , Zetia, and Repatha for cholesterol management. -Continue current regimen.

## 2023-11-16 NOTE — Assessment & Plan Note (Deleted)
Patient recently switched from carvedilol to metoprolol 25mg  due to blood pressure concerns and rhythm issues. -Continue metoprolol 25mg . -Continue to follow up with Cardiology

## 2023-11-16 NOTE — Assessment & Plan Note (Signed)
Recent TSH levels at high end of normal range or slightly elevated. Patient on levothyroxine daily and amiodarone.  Likely transient elevation from recent addition of amiodarone. -Can repeat Thyroid levels in 3 months to ensure back to baseline

## 2023-11-16 NOTE — Assessment & Plan Note (Signed)
>>  ASSESSMENT AND PLAN FOR SLEEP DISTURBANCE WRITTEN ON 11/16/2023  2:34 PM BY WALSH, TANYA, MD  Difficulty staying asleep.  Average of 3-4 hours night since discharge from hospital s/p CABG. Took Xanax  that did not help  -Start Lunesta

## 2023-11-16 NOTE — Assessment & Plan Note (Addendum)
Worsening over the past 2-3 weeks, difficulty maintaining sleep, waking up around 2 AM, unable to return to sleep. Previously used Xanax post open-heart surgery with some benefit. -Start Lunesta 1 mg qhs -Request patient feedback after 1 week of use. -Encouraged CBT -Offered SSRI, not interested today -Duloxetine discontinued, unknown why.  Patient not interested in restarting -Offered referral to psychiatry, patient politely declined.

## 2023-11-16 NOTE — Assessment & Plan Note (Deleted)
Patient on atorvastatin 20mg , Zetia, and Repatha for cholesterol management. -Continue current regimen.

## 2023-11-16 NOTE — Assessment & Plan Note (Signed)
>>  ASSESSMENT AND PLAN FOR DEPRESSION, MAJOR, RECURRENT (HCC) WRITTEN ON 11/16/2023  2:32 PM BY WALSH, TANYA, MD  Worsening over the past 2-3 weeks, difficulty maintaining sleep, waking up around 2 AM, unable to return to sleep. Previously used Xanax  post open-heart surgery with some benefit. -Start Lunesta  1 mg qhs -Request patient feedback after 1 week of use. -Encouraged CBT -Offered SSRI, not interested today -Duloxetine  discontinued, unknown why.  Patient not interested in restarting -Offered referral to psychiatry, patient politely declined.

## 2023-11-16 NOTE — Assessment & Plan Note (Signed)
Difficulty staying asleep.  Average of 3-4 hours night since discharge from hospital s/p CABG. Took Xanax that did not help  -Start [address]

## 2023-11-21 ENCOUNTER — Other Ambulatory Visit: Payer: Self-pay

## 2023-11-28 ENCOUNTER — Encounter: Payer: 59 | Attending: Thoracic Surgery (Cardiothoracic Vascular Surgery) | Admitting: *Deleted

## 2023-11-28 ENCOUNTER — Other Ambulatory Visit: Payer: Self-pay

## 2023-11-28 VITALS — Ht 73.0 in | Wt 216.7 lb

## 2023-11-28 DIAGNOSIS — Z951 Presence of aortocoronary bypass graft: Secondary | ICD-10-CM | POA: Diagnosis not present

## 2023-11-28 NOTE — Progress Notes (Signed)
Daily Session Note  Patient Details  Name: Bobby [name] MRN: [id number] Date of Birth: 19-Apr-1971 Referring Provider:   Flowsheet Row Cardiac Rehab from 08/22/2023 in [facility] Cardiac and Pulmonary Rehab  Referring Provider Gollan       Encounter Date: 11/28/2023  Check In:  Session Check In - 11/28/23 1550       Check-In   Supervising physician immediately available to respond to emergencies See telemetry face sheet for immediately available ER MD    Location [facility]-Cardiac & Pulmonary Rehab    Staff Present Elige Ko, RCP,RRT,BSRT;Brinleigh Tew Jewel Baize, RN Mabeline Caras, BS, ACSM CEP, Exercise Physiologist;Maxon Conetta BS, , Exercise Physiologist    Virtual Visit No    Medication changes reported     No    Fall or balance concerns reported    No    Warm-up and Cool-down Performed on first and last piece of equipment    Resistance Training Performed Yes    VAD Patient? No    PAD/SET Patient? No      Pain Assessment   Currently in Pain? No/denies                Social History   Tobacco Use  Smoking Status Former   Current packs/day: 0.00   Average packs/day: 0.5 packs/day for 20.0 years (10.0 ttl pk-yrs)   Types: Cigarettes   Start date: 07/13/1997   Quit date: 07/13/2017   Years since quitting: 6.3  Smokeless Tobacco Never  Tobacco Comments   quit 2012    Goals Met:  Independence with exercise equipment Exercise tolerated well No report of concerns or symptoms today Strength training completed today  Goals Unmet:  Not Applicable   Reviewed home exercise with pt today.  Pt plans to use his smart bike and TM at home upon graduation from cardiac rehab  for exercise.  Reviewed THR, pulse, RPE, sign and symptoms, pulse oximetery and when to call 911 or MD.  Also discussed weather considerations and indoor options.  Pt voiced understanding.    Comments:  Bobby [name] graduated today from  rehab with 20 sessions completed.  Details of the patient's exercise prescription  and what He needs to do in order to continue the prescription and progress were discussed with patient.  Patient was given a copy of prescription and goals.  Patient verbalized understanding. Bobby [name] plans to continue to exercise by using bike and treadmill at home.     Dr. Bethann Punches is Medical Director for [facility] Cardiac Rehabilitation.  Dr. Vida Rigger is Medical Director for [facility] Pulmonary Rehabilitation.

## 2023-11-28 NOTE — Patient Instructions (Signed)
Discharge Patient Instructions  Patient Details  Name: Bobby [name] MRN: [id number] Date of Birth: [birth year] Referring Provider:  Dana Allan, MD   Number of Visits: 20  Reason for Discharge:  Patient reached a stable level of exercise. Patient independent in their exercise. Patient has met program and personal goals.   Diagnosis:  S/P CABG x 1  Initial Exercise Prescription:  Initial Exercise Prescription - 08/22/23 1500       Date of Initial Exercise RX and Referring Provider   Date 08/22/23    Referring Provider Gollan      Oxygen   Maintain Oxygen Saturation 88% or higher      Treadmill   MPH 3    Grade 1    Minutes 15    METs 3.71      NuStep   Level 2   T6   SPM 80    Minutes 15    METs 3.76      Elliptical   Level 1    Speed 3    Minutes 15    METs 3.76      Prescription Details   Frequency (times per week) 3    Duration Progress to 30 minutes of continuous aerobic without signs/symptoms of physical distress      Intensity   THRR 40-80% of Max Heartrate 111-148    Ratings of Perceived Exertion 11-13    Perceived Dyspnea 0-4      Progression   Progression Continue to progress workloads to maintain intensity without signs/symptoms of physical distress.      Resistance Training   Training Prescription Yes    Weight 5    Reps 10-15             Functional Capacity:  6 Minute Walk     Row Name 08/22/23 1555 11/28/23 1600       6 Minute Walk   Phase Initial Discharge    Distance 1330 feet 1560 feet    Distance % Change -- 24.8 %    Distance Feet Change -- 330 ft    Walk Time 6 minutes 6 minutes    # of Rest Breaks 0 0    MPH 2.52 3    METS 3.76 4.12    RPE 9 0    Perceived Dyspnea  0 0    VO2 Peak 13.14 14.4    Symptoms No No    Resting HR 74 bpm 65 bpm    Resting BP 112/72 110/62    Resting Oxygen Saturation  96 % 98 %    Exercise Oxygen Saturation  during 6 min walk 98 % 98 %    Max Ex. HR 97 bpm 102 bpm    Max Ex. BP  122/70 106/60    2 Minute Post BP 104/62 --             Nutrition & Weight - Outcomes:  Pre Biometrics - 08/22/23 1606       Pre Biometrics   Height 6\' 1"  (1.854 m)    Weight 221 lb 12.8 oz (100.6 kg)    Waist Circumference 42 inches    Hip Circumference 41.5 inches    Waist to Hip Ratio 1.01 %    BMI (Calculated) 29.27    Single Leg Stand 30 seconds             Post Biometrics - 11/28/23 1602        Post  Biometrics   Height 6\' 1"  (1.854  m)    Weight 216 lb 11.2 oz (98.3 kg)    Waist Circumference 38.7 inches    Hip Circumference 38.7 inches    Waist to Hip Ratio 1 %    BMI (Calculated) 28.6    Single Leg Stand 30 seconds             Goals reviewed with patient; copy given to patient.

## 2023-11-28 NOTE — Progress Notes (Signed)
Cardiac Individual Treatment Plan  Patient Details  Name: Bobby [name] MRN: [id number] Date of Birth: Jun 29, 1971 Referring Provider:   Flowsheet Row Cardiac Rehab from 08/22/2023 in [facility] Cardiac and Pulmonary Rehab  Referring Provider Gollan       Initial Encounter Date:  Flowsheet Row Cardiac Rehab from 08/22/2023 in [facility] Cardiac and Pulmonary Rehab  Date 08/22/23       Visit Diagnosis: S/P CABG x 1  Patient's Home Medications on Admission:  Current Outpatient Medications:    amiodarone (PACERONE) 200 MG tablet, Take 1 tablet (200 mg total) by mouth daily., Disp: 90 tablet, Rfl: 3   apixaban (ELIQUIS) 5 MG TABS tablet, Take 1 tablet (5 mg total) by mouth 2 (two) times daily., Disp: 180 tablet, Rfl: 3   aspirin 81 MG chewable tablet, Chew 1 tablet (81 mg total) by mouth daily., Disp: 30 tablet, Rfl: 11   atorvastatin (LIPITOR) 20 MG tablet, Take 1 tablet (20 mg total) by mouth daily., Disp: 90 tablet, Rfl: 3   cephALEXin (KEFLEX) 500 MG capsule, Take 1 capsule (500 mg total) by mouth 3 (three) times daily for 7 days., Disp: 21 capsule, Rfl: 0   dapagliflozin propanediol (FARXIGA) 10 MG TABS tablet, Take 1 tablet (10 mg total) by mouth daily before breakfast., Disp: 90 tablet, Rfl: 3   eszopiclone (LUNESTA) 1 MG TABS tablet, Take 1 tablet (1 mg total) by mouth at bedtime as needed for sleep. Take immediately before bedtime, Disp: 30 tablet, Rfl: 0   Evolocumab (REPATHA SURECLICK) 140 MG/ML SOAJ, Inject 140mg  into the skin once every 14 days., Disp: 2 mL, Rfl: 3   ezetimibe (ZETIA) 10 MG tablet, Take 1 tablet (10 mg total) by mouth daily., Disp: 90 tablet, Rfl: 3   furosemide (LASIX) 20 MG tablet, Take 1 tablet (20 mg) by mouth once every other day, Disp: , Rfl:    furosemide (LASIX) 40 MG tablet, Take 1 tablet (40 mg total) by mouth daily as needed. Take for shortness of breath or abdominal swelling., Disp: 90 tablet, Rfl: 3   levothyroxine (SYNTHROID) 137 MCG tablet, Take 1 tablet  (137 mcg total) by mouth daily., Disp: 90 tablet, Rfl: 0   metoprolol succinate (TOPROL-XL) 25 MG 24 hr tablet, Take 1 tablet (25 mg total) by mouth at bedtime., Disp: 90 tablet, Rfl: 0   pantoprazole (PROTONIX) 40 MG tablet, Take 1 tablet (40 mg total) by mouth 2 (two) times daily., Disp: 60 tablet, Rfl: 3   Potassium Chloride ER 20 MEQ TBCR, Take 20 mEq by mouth daily., Disp: , Rfl:    potassium chloride SA (KLOR-CON M20) 20 MEQ tablet, Take 1 tablet (20 mEq total) by mouth daily as needed. Take as needed with Lasix., Disp: 90 tablet, Rfl: 3   sildenafil (VIAGRA) 100 MG tablet, Take 1 tablet (100 mg total) by mouth as needed for erectile dysfunction. TAKE 1 (ONE) TABLET AS NEEDED. DO NOT USE AT SAME TIME AS NITRO, Disp: 30 tablet, Rfl: 3  Past Medical History: Past Medical History:  Diagnosis Date   Acute MI anterior wall first episode care (HCC) 07/13/2017   Occluded ostial LAD S/P 3.5x26 Onyx DES. RI 80%. RCA Occluded at prior stent site with collaterals    Cardiogenic shock (HCC) 07/13/2017   CHF (congestive heart failure) (HCC)    Graves disease    Hypertension    MI (myocardial infarction) (HCC)    Thyroid disease    thyroid irradiated per pt    Tobacco Use: Social History  Tobacco Use  Smoking Status Former   Current packs/day: 0.00   Average packs/day: 0.5 packs/day for 20.0 years (10.0 ttl pk-yrs)   Types: Cigarettes   Start date: 07/13/1997   Quit date: 07/13/2017   Years since quitting: 6.3  Smokeless Tobacco Never  Tobacco Comments   quit 2012    Labs: Review Flowsheet  More data exists      Latest Ref Rng & Units 07/26/2022 06/13/2023 06/22/2023 06/23/2023 06/25/2023  Labs for ITP Cardiac and Pulmonary Rehab  Cholestrol 0 - 200 mg/dL 74  71  - - -  LDL (calc) 0 - 99 mg/dL 24  18  - - -  HDL-C >10.27 mg/dL 39  25.36  - - -  Trlycerides 0.0 - 149.0 mg/dL 54  64.4  - - -  Hemoglobin A1c 4.8 - 5.6 % 6.3  6.5  - 6.4  -  PH, Arterial 7.35 - 7.45 - - 7.383  - 7.287  7.418   7.345  7.326  7.402  7.313   PCO2 arterial 32 - 48 mmHg - - 37.6  - 44.6  32.4  39.2  43.2  38.4  44.5   Bicarbonate 20.0 - 28.0 mmol/L - - 24.9  22.4  - 21.4  21.1  21.7  22.6  23.9  22.5   TCO2 22 - 32 mmol/L - - 26  24  - 23  22  23  23  24  26  25  25  24  29    Acid-base deficit 0.0 - 2.0 mmol/L - - 1.0  2.0  - 5.0  3.0  4.0  3.0  1.0  4.0   O2 Saturation % - - 71  99  - 99  98  96  97  100  100     Details       Multiple values from one day are sorted in reverse-chronological order          Exercise Target Goals: Exercise Program Goal: Individual exercise prescription set using results from initial 6 min walk test and THRR while considering  patient's activity barriers and safety.   Exercise Prescription Goal: Initial exercise prescription builds to 30-45 minutes a day of aerobic activity, 2-3 days per week.  Home exercise guidelines will be given to patient during program as part of exercise prescription that the participant will acknowledge.   Education: Aerobic Exercise: - Group verbal and visual presentation on the components of exercise prescription. Introduces F.I.T.T principle from ACSM for exercise prescriptions.  Reviews F.I.T.T. principles of aerobic exercise including progression. Written material given at graduation.   Education: Resistance Exercise: - Group verbal and visual presentation on the components of exercise prescription. Introduces F.I.T.T principle from ACSM for exercise prescriptions  Reviews F.I.T.T. principles of resistance exercise including progression. Written material given at graduation.    Education: Exercise & Equipment Safety: - Individual verbal instruction and demonstration of equipment use and safety with use of the equipment. Flowsheet Row Cardiac Rehab from 08/22/2023 in [facility] Cardiac and Pulmonary Rehab  Date 08/22/23  Educator [facility]  Instruction Review Code 1- Verbalizes Understanding       Education: Exercise Physiology & General  Exercise Guidelines: - Group verbal and written instruction with models to review the exercise physiology of the cardiovascular system and associated critical values. Provides general exercise guidelines with specific guidelines to those with heart or lung disease.  Flowsheet Row Cardiac Rehab from 11/12/2017 in [facility] Cardiac and Pulmonary Rehab  Date 10/22/17  Educator [employer]  Instruction Review Code 1- Tax inspector       Education: Flexibility, Balance, Mind/Body Relaxation: - Group verbal and visual presentation with interactive activity on the components of exercise prescription. Introduces F.I.T.T principle from ACSM for exercise prescriptions. Reviews F.I.T.T. principles of flexibility and balance exercise training including progression. Also discusses the mind body connection.  Reviews various relaxation techniques to help reduce and manage stress (i.e. Deep breathing, progressive muscle relaxation, and visualization). Balance handout provided to take home. Written material given at graduation.   Activity Barriers & Risk Stratification:  Activity Barriers & Cardiac Risk Stratification - 08/22/23 1558       Activity Barriers & Cardiac Risk Stratification   Activity Barriers Back Problems;Incisional Pain    Cardiac Risk Stratification High             6 Minute Walk:  6 Minute Walk     Row Name 08/22/23 1555 11/28/23 1600       6 Minute Walk   Phase Initial Discharge    Distance 1330 feet 1560 feet    Distance % Change -- 24.8 %    Distance Feet Change -- 330 ft    Walk Time 6 minutes 6 minutes    # of Rest Breaks 0 0    MPH 2.52 3    METS 3.76 4.12    RPE 9 0    Perceived Dyspnea  0 0    VO2 Peak 13.14 14.4    Symptoms No No    Resting HR 74 bpm 65 bpm    Resting BP 112/72 110/62    Resting Oxygen Saturation  96 % 98 %    Exercise Oxygen Saturation  during 6 min walk 98 % 98 %    Max Ex. HR 97 bpm 102 bpm    Max Ex. BP 122/70 106/60    2 Minute Post BP  104/62 --             Oxygen Initial Assessment:   Oxygen Re-Evaluation:   Oxygen Discharge (Final Oxygen Re-Evaluation):   Initial Exercise Prescription:  Initial Exercise Prescription - 08/22/23 1500       Date of Initial Exercise RX and Referring Provider   Date 08/22/23    Referring Provider Gollan      Oxygen   Maintain Oxygen Saturation 88% or higher      Treadmill   MPH 3    Grade 1    Minutes 15    METs 3.71      NuStep   Level 2   T6   SPM 80    Minutes 15    METs 3.76      Elliptical   Level 1    Speed 3    Minutes 15    METs 3.76      Prescription Details   Frequency (times per week) 3    Duration Progress to 30 minutes of continuous aerobic without signs/symptoms of physical distress      Intensity   THRR 40-80% of Max Heartrate 111-148    Ratings of Perceived Exertion 11-13    Perceived Dyspnea 0-4      Progression   Progression Continue to progress workloads to maintain intensity without signs/symptoms of physical distress.      Resistance Training   Training Prescription Yes    Weight 5    Reps 10-15             Perform Capillary Blood Glucose checks  as needed.  Exercise Prescription Changes:   Exercise Prescription Changes     Row Name 08/22/23 1500 09/11/23 1500 09/24/23 1500 10/10/23 0700 10/24/23 0900     Response to Exercise   Blood Pressure (Admit) 112/72 104/60 132/82 102/60 110/62   Blood Pressure (Exercise) 122/70 148/68 152/76 152/70 --   Blood Pressure (Exit) 104/62 98/58 102/60 102/60 92/58   Heart Rate (Admit) 74 bpm 72 bpm 72 bpm 79 bpm 77 bpm   Heart Rate (Exercise) 97 bpm 137 bpm 128 bpm 131 bpm 141 bpm   Heart Rate (Exit) 73 bpm 94 bpm 89 bpm 95 bpm 71 bpm   Oxygen Saturation (Admit) 96 % -- -- -- --   Oxygen Saturation (Exercise) 98 % -- -- -- --   Oxygen Saturation (Exit) 98 % -- -- -- --   Rating of Perceived Exertion (Exercise) 9 15 13 15 16    Perceived Dyspnea (Exercise) 0 0 -- -- --    Symptoms none none none none none   Comments 6 MWT results -- -- -- --   Duration -- Progress to 30 minutes of  aerobic without signs/symptoms of physical distress Continue with 30 min of aerobic exercise without signs/symptoms of physical distress. Continue with 30 min of aerobic exercise without signs/symptoms of physical distress. Continue with 30 min of aerobic exercise without signs/symptoms of physical distress.   Intensity -- THRR unchanged THRR unchanged THRR unchanged THRR unchanged     Progression   Progression -- Continue to progress workloads to maintain intensity without signs/symptoms of physical distress. Continue to progress workloads to maintain intensity without signs/symptoms of physical distress. Continue to progress workloads to maintain intensity without signs/symptoms of physical distress. Continue to progress workloads to maintain intensity without signs/symptoms of physical distress.   Average METs -- 4.22 3.91 5.73 5.22     Resistance Training   Training Prescription -- Yes Yes Yes Yes   Weight -- 5 5 lb 5 lb 10 lb   Reps -- 10-15 10-15 10-15 10-15     Interval Training   Interval Training -- No No No No     Treadmill   MPH -- 3 3 2.8 3.3   Grade -- 4 4 9 6    Minutes -- 15 15 15 15    METs -- 4.95 4.95 6.62 0.25     NuStep   Level -- 2  T6 4  T6 nustep -- --   Minutes -- 15 15 -- --   METs -- 3.2 2.2 -- --     Elliptical   Level -- 2 5 5 13    Speed -- 3 4.5 4.5 4.5   Minutes -- 15 15 15 15    METs -- 5.4 -- 5.5 7.5     Oxygen   Maintain Oxygen Saturation -- 88% or higher 88% or higher 88% or higher 88% or higher    Row Name 11/08/23 1400 11/21/23 0800 11/28/23 1600         Response to Exercise   Blood Pressure (Admit) 140/80 102/68 --     Blood Pressure (Exit) 98/58 100/62 --     Heart Rate (Admit) 78 bpm 93 bpm --     Heart Rate (Exercise) 132 bpm 141 bpm --     Heart Rate (Exit) 93 bpm 96 bpm --     Rating of Perceived Exertion (Exercise) 14 15  --     Perceived Dyspnea (Exercise) 0 -- --     Symptoms none none --  Duration Continue with 30 min of aerobic exercise without signs/symptoms of physical distress. Continue with 30 min of aerobic exercise without signs/symptoms of physical distress. Continue with 30 min of aerobic exercise without signs/symptoms of physical distress.     Intensity THRR unchanged THRR unchanged THRR unchanged       Progression   Progression Continue to progress workloads to maintain intensity without signs/symptoms of physical distress. Continue to progress workloads to maintain intensity without signs/symptoms of physical distress. Continue to progress workloads to maintain intensity without signs/symptoms of physical distress.     Average METs 5.99 5.63 5.63       Resistance Training   Training Prescription Yes Yes Yes     Weight 10 lb 10 lb 10 lb     Reps 10-15 10-15 10-15       Interval Training   Interval Training No No No       Treadmill   MPH 3 3 3      Grade 6 6 6      Minutes 15 15 15      METs 5.78 5.78 5.78       Elliptical   Level 10 10 10      Speed 3.1 3.1 3.1     Minutes 15 15 15      METs 6.2 6.2 6.2       Home Exercise Plan   Plans to continue exercise at -- -- Home (comment)  patient early grad from [facility] and plans to exercise at home 4-5 days per week on smart bike and TM at home.     Frequency -- -- Add 4 additional days to program exercise sessions.     Initial Home Exercises Provided -- -- 11/28/23       Oxygen   Maintain Oxygen Saturation 88% or higher 88% or higher 88% or higher              Exercise Comments:   Exercise Comments     Row Name 08/29/23 1541           Exercise Comments First full day of exercise!  Patient was oriented to gym and equipment including functions, settings, policies, and procedures.  Patient's individual exercise prescription and treatment plan were reviewed.  All starting workloads were established based on the results of the 6  minute walk test done at initial orientation visit.  The plan for exercise progression was also introduced and progression will be customized based on patient's performance and goals.                Exercise Goals and Review:   Exercise Goals     Row Name 08/22/23 1602             Exercise Goals   Increase Physical Activity Yes       Intervention Provide advice, education, support and counseling about physical activity/exercise needs.;Develop an individualized exercise prescription for aerobic and resistive training based on initial evaluation findings, risk stratification, comorbidities and participant's personal goals.       Expected Outcomes Short Term: Attend rehab on a regular basis to increase amount of physical activity.;Long Term: Add in home exercise to make exercise part of routine and to increase amount of physical activity.;Long Term: Exercising regularly at least 3-5 days a week.       Increase Strength and Stamina Yes       Intervention Provide advice, education, support and counseling about physical activity/exercise needs.;Develop an individualized exercise prescription for aerobic and resistive  training based on initial evaluation findings, risk stratification, comorbidities and participant's personal goals.       Expected Outcomes Short Term: Increase workloads from initial exercise prescription for resistance, speed, and METs.;Short Term: Perform resistance training exercises routinely during rehab and add in resistance training at home;Long Term: Improve cardiorespiratory fitness, muscular endurance and strength as measured by increased METs and functional capacity ( )       Able to understand and use rate of perceived exertion (RPE) scale Yes       Intervention Provide education and explanation on how to use RPE scale       Expected Outcomes Short Term: Able to use RPE daily in rehab to express subjective intensity level;Long Term:  Able to use RPE to guide  intensity level when exercising independently       Able to understand and use Dyspnea scale Yes       Intervention Provide education and explanation on how to use Dyspnea scale       Expected Outcomes Short Term: Able to use Dyspnea scale daily in rehab to express subjective sense of shortness of breath during exertion;Long Term: Able to use Dyspnea scale to guide intensity level when exercising independently       Knowledge and understanding of Target Heart Rate Range (THRR) Yes       Intervention Provide education and explanation of THRR including how the numbers were predicted and where they are located for reference       Expected Outcomes Short Term: Able to state/look up THRR;Long Term: Able to use THRR to govern intensity when exercising independently;Short Term: Able to use daily as guideline for intensity in rehab       Able to check pulse independently Yes       Intervention Provide education and demonstration on how to check pulse in carotid and radial arteries.;Review the importance of being able to check your own pulse for safety during independent exercise       Expected Outcomes Short Term: Able to explain why pulse checking is important during independent exercise;Long Term: Able to check pulse independently and accurately       Understanding of Exercise Prescription Yes       Intervention Provide education, explanation, and written materials on patient's individual exercise prescription       Expected Outcomes Short Term: Able to explain program exercise prescription;Long Term: Able to explain home exercise prescription to exercise independently                Exercise Goals Re-Evaluation :  Exercise Goals Re-Evaluation     Row Name 08/29/23 1546 09/11/23 1548 09/24/23 1522 10/10/23 0754 10/24/23 0936     Exercise Goal Re-Evaluation   Exercise Goals Review Increase Physical Activity;Able to understand and use rate of perceived exertion (RPE) scale;Knowledge and  understanding of Target Heart Rate Range (THRR);Increase Strength and Stamina;Able to check pulse independently;Understanding of Exercise Prescription Increase Physical Activity;Increase Strength and Stamina;Understanding of Exercise Prescription Increase Physical Activity;Increase Strength and Stamina;Understanding of Exercise Prescription Increase Physical Activity;Increase Strength and Stamina;Understanding of Exercise Prescription Increase Physical Activity;Increase Strength and Stamina;Understanding of Exercise Prescription   Comments Reviewed RPE  and dyspnea scale, THR and program prescription with pt today.  Pt voiced understanding and was given a copy of goals to take home. Zedric has completed his first 4 sessions during this review, and continues to get familiar with the program. He recently increased his grade on the treamill from 1% to 4%. He  also increased his elliptical level from 1 to 2. We will continue to monitor his progress in the program. Ketrick continues to do well in rehab. He has continued to walk the treadmill at a speed of 3 mph and an incline of 4%. He also improved to level 5 on the elliptical and level 4 on the T6 nustep. We will continue to monitor his progress in the program. Auguster has only attended two sessions since the last review. However, he was able to increase his treadmill workload up to a speed of 2.8 mph with a 9% incline. He also continues to work at level 5 on the elliptical and use 5 lb hand weights for resistance training. We will continue to monitor his progress in the program. Ewald continues to do well in rehab. He recently increased his treadmill workload to a speed of 3.3 mph while maintaining an incline of 6%. He also increased from level 5 to level 13 on the elliptical. He increased to 10 lb hand weights for resistance training as well. We will continue to monitor his progress in the program.   Expected Outcomes Short: Use RPE daily to regulate intensity.  Long: Follow  program prescription in THR. Short: Continue to follow current exercise prescription, and progressively increase workloads. Long: Continue exercise to improve strength and stamina. Short: Begin to progressively increase treadmill workload. Long: Continue exercise to improve strength and stamina. Short: Attend rehab more consistently. Long: Continue exercise to improve strength and stamina. Short: Continue to progressively increase treadmill workload. Long: Continue exercise to improve strength and stamina.    Row Name 11/08/23 1436 11/21/23 0817 11/28/23 1623         Exercise Goal Re-Evaluation   Exercise Goals Review Increase Physical Activity;Increase Strength and Stamina;Understanding of Exercise Prescription Increase Physical Activity;Increase Strength and Stamina;Understanding of Exercise Prescription Increase Physical Activity;Able to understand and use rate of perceived exertion (RPE) scale;Knowledge and understanding of Target Heart Rate Range (THRR);Understanding of Exercise Prescription;Increase Strength and Stamina;Able to understand and use Dyspnea scale;Able to check pulse independently     Comments Nickson continues to do well in rehab. He was only able to attend one rehab sesison during this review. During this one session he decreased both his level on the elliptical and his speed on the treadmill. We will encourage him to increase to previous workloads, and monitor his progress. Reign continues to do well in rehab. He continues to stay consistent on the elliptical at level 10 and the treadmill at a speed of 3 mph and an incline of 6%. He also has done well with 10 lb hand weights for resistance training in the program. We will continue to monitor his progress in the program. Reviewed home exercise with pt today.  Pt plans to use his smart bike and TM at home upon graduation from cardiac rehab  for exercise.  Reviewed THR, pulse, RPE, sign and symptoms, pulse oximetery and when to call 911 or MD.   Also discussed weather considerations and indoor options.  Pt voiced understanding.     Expected Outcomes Short: Continue to progressively increase treadmill workload. Long: Continue exercise to improve strength and stamina. Short: Continue to progressively increase treadmill workload. Long: Continue exercise to improve strength and stamina. Short: graduate from cardiac rehab and begin 4-5 days a week of home exericse. Long: maintain independent exercise routine.              Discharge Exercise Prescription (Final Exercise Prescription Changes):  Exercise Prescription Changes -  11/28/23 1600       Response to Exercise   Duration Continue with 30 min of aerobic exercise without signs/symptoms of physical distress.    Intensity THRR unchanged      Progression   Progression Continue to progress workloads to maintain intensity without signs/symptoms of physical distress.    Average METs 5.63      Resistance Training   Training Prescription Yes    Weight 10 lb    Reps 10-15      Interval Training   Interval Training No      Treadmill   MPH 3    Grade 6    Minutes 15    METs 5.78      Elliptical   Level 10    Speed 3.1    Minutes 15    METs 6.2      Home Exercise Plan   Plans to continue exercise at Home (comment)   patient early grad from [facility] and plans to exercise at home 4-5 days per week on smart bike and TM at home.   Frequency Add 4 additional days to program exercise sessions.    Initial Home Exercises Provided 11/28/23      Oxygen   Maintain Oxygen Saturation 88% or higher             Nutrition:  Target Goals: Understanding of nutrition guidelines, daily intake of sodium 1500mg , cholesterol 200mg , calories 30% from fat and 7% or less from saturated fats, daily to have 5 or more servings of fruits and vegetables.  Education: All About Nutrition: -Group instruction provided by verbal, written material, interactive activities, discussions, models, and  posters to present general guidelines for heart healthy nutrition including fat, fiber, MyPlate, the role of sodium in heart healthy nutrition, utilization of the nutrition label, and utilization of this knowledge for meal planning. Follow up email sent as well. Written material given at graduation. Flowsheet Row Cardiac Rehab from 11/12/2017 in [facility] Cardiac and Pulmonary Rehab  Date 10/08/17  Educator PI  Instruction Review Code 1- Verbalizes Understanding       Biometrics:  Pre Biometrics - 08/22/23 1606       Pre Biometrics   Height 6\' 1"  (1.854 m)    Weight 221 lb 12.8 oz (100.6 kg)    Waist Circumference 42 inches    Hip Circumference 41.5 inches    Waist to Hip Ratio 1.01 %    BMI (Calculated) 29.27    Single Leg Stand 30 seconds             Post Biometrics - 11/28/23 1602        Post  Biometrics   Height 6\' 1"  (1.854 m)    Weight 216 lb 11.2 oz (98.3 kg)    Waist Circumference 38.7 inches    Hip Circumference 38.7 inches    Waist to Hip Ratio 1 %    BMI (Calculated) 28.6    Single Leg Stand 30 seconds             Nutrition Therapy Plan and Nutrition Goals:  Nutrition Therapy & Goals - 08/22/23 1607       Intervention Plan   Intervention Prescribe, educate and counsel regarding individualized specific dietary modifications aiming towards targeted core components such as weight, hypertension, lipid management, diabetes, heart failure and other comorbidities.    Expected Outcomes Short Term Goal: Understand basic principles of dietary content, such as calories, fat, sodium, cholesterol and nutrients.;Short Term Goal: A plan  has been developed with personal nutrition goals set during dietitian appointment.;Long Term Goal: Adherence to prescribed nutrition plan.             Nutrition Assessments:  MEDIFICTS Score Key: >=70 Need to make dietary changes  40-70 Heart Healthy Diet <= 40 Therapeutic Level Cholesterol Diet  Flowsheet Row Cardiac Rehab  from 08/22/2023 in [facility] Cardiac and Pulmonary Rehab  Picture Your Plate Total Score on Admission 69      Picture Your Plate Scores: <01 Unhealthy dietary pattern with much room for improvement. 41-50 Dietary pattern unlikely to meet recommendations for good health and room for improvement. 51-60 More healthful dietary pattern, with some room for improvement.  >60 Healthy dietary pattern, although there may be some specific behaviors that could be improved.    Nutrition Goals Re-Evaluation:  Nutrition Goals Re-Evaluation     Row Name 09/06/23 1555 10/15/23 1541 11/07/23 1546         Goals   Current Weight 216 lb (98 kg) -- --     Comment Yared defers dietitian appointment. Gregary Signs defers dietitian appointment. Gregary Signs defers dietitian appointment.              Nutrition Goals Discharge (Final Nutrition Goals Re-Evaluation):  Nutrition Goals Re-Evaluation - 11/07/23 1546       Goals   Comment Maahir defers dietitian appointment.             Psychosocial: Target Goals: Acknowledge presence or absence of significant depression and/or stress, maximize coping skills, provide positive support system. Participant is able to verbalize types and ability to use techniques and skills needed for reducing stress and depression.   Education: Stress, Anxiety, and Depression - Group verbal and visual presentation to define topics covered.  Reviews how body is impacted by stress, anxiety, and depression.  Also discusses healthy ways to reduce stress and to treat/manage anxiety and depression.  Written material given at graduation.   Education: Sleep Hygiene -Provides group verbal and written instruction about how sleep can affect your health.  Define sleep hygiene, discuss sleep cycles and impact of sleep habits. Review good sleep hygiene tips.    Initial Review & Psychosocial Screening:  Initial Psych Review & Screening - 08/07/23 1309       Initial Review   Current issues with Current  Sleep Concerns      Family Dynamics   Good Support System? Yes   wife     Barriers   Psychosocial barriers to participate in program There are no identifiable barriers or psychosocial needs.;The patient should benefit from training in stress management and relaxation.      Screening Interventions   Expected Outcomes Short Term goal: Utilizing psychosocial counselor, staff and physician to assist with identification of specific Stressors or current issues interfering with healing process. Setting desired goal for each stressor or current issue identified.;Short Term goal: Identification and review with participant of any Quality of Life or Depression concerns found by scoring the questionnaire.;Long Term Goal: Stressors or current issues are controlled or eliminated.;Long Term goal: The participant improves quality of Life and PHQ9 Scores as seen by post scores and/or verbalization of changes             Quality of Life Scores:   Quality of Life - 11/28/23 1611       Quality of Life   Select Quality of Life      Quality of Life Scores   Health/Function Pre 16.27 %    Health/Function Post  23.13 %    Health/Function % Change 42.16 %    Socioeconomic Pre 17.44 %    Socioeconomic Post 25.93 %    Socioeconomic % Change  48.68 %    Psych/Spiritual Pre 16.93 %    Psych/Spiritual Post 23.79 %    Psych/Spiritual % Change 40.52 %    Family Pre 17.3 %    Family Post 28.8 %    Family % Change 66.47 %    GLOBAL Pre 16.81 %    GLOBAL Post 24.68 %    GLOBAL % Change 46.82 %            Scores of 19 and below usually indicate a poorer quality of life in these areas.  A difference of  2-3 points is a clinically meaningful difference.  A difference of 2-3 points in the total score of the Quality of Life Index has been associated with significant improvement in overall quality of life, self-image, physical symptoms, and general health in studies assessing change in quality of  life.  PHQ-9: Review Flowsheet  More data exists      11/28/2023 09/06/2023 08/22/2023 06/20/2023 05/23/2023  Depression screen PHQ 2/9  Decreased Interest 1 0 1 1 1   Down, Depressed, Hopeless 0 0 1 1 1   PHQ - 2 Score 1 0 2 2 2   Altered sleeping 1 1 2 1 2   Tired, decreased energy 1 1 1 1 3   Change in appetite 0 0 0 1 3  Feeling bad or failure about yourself  1 0 0 1 0  Trouble concentrating 1 2 2 1 3   Moving slowly or fidgety/restless 0 0 0 1 0  Suicidal thoughts 0 0 0 0 0  PHQ-9 Score 5 4 7 8 13   Difficult doing work/chores Not difficult at all Somewhat difficult Somewhat difficult Somewhat difficult Somewhat difficult    Details           Interpretation of Total Score  Total Score Depression Severity:  1-4 = Minimal depression, 5-9 = Mild depression, 10-14 = Moderate depression, 15-19 = Moderately severe depression, 20-27 = Severe depression   Psychosocial Evaluation and Intervention:  Psychosocial Evaluation - 08/07/23 1325       Psychosocial Evaluation & Interventions   Interventions Encouraged to exercise with the program and follow exercise prescription    Comments 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 He did the program in 2018 after an MI. He states he doesn't feel stressed currenlty, just trying to be patient with the healing process. 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 His wife and dog are his main support system. He states his sleep has been horrible since surgery and sometimes he takes a Tylenol PM. He thinks it is slowly getting better, but he is up many times a night. His sternum incision did open and require debridement, he is almost done doing wet to dry dressing. He wants to work on his stamina and strength. He was supposed to participate in a [employer] race, but is unable to this year. His goal is to now be able to participate in this next year.    Expected Outcomes Short: attend cardiac rehab  for education and exercise. Long: develop and maintain positive self care    Continue Psychosocial Services  Follow up required by staff             Psychosocial  Re-Evaluation:  Psychosocial Re-Evaluation     Row Name 09/06/23 1557 10/15/23 1538 11/07/23 1547         Psychosocial Re-Evaluation   Current issues with Current Stress Concerns None Identified Current Sleep Concerns     Comments Reviewed patient health questionnaire (PHQ-9) with patient for follow up. Previously, patients score indicated signs/symptoms of depression.  Reviewed to see if patient is improving symptom wise while in program.  Score improved and patient states that it is because he has good support at home and his health has been getting better. Patient reports no issues with their current mental states, sleep, stress, depression or anxiety. Will follow up with patient in a few weeks for any changes. Patient reports no issues with their current mental states, stress, depression or anxiety. Will follow up with patient in a few weeks for any changes. Stacy has been having trouble staying asleep. He takes meletonin and is going to talk to his doctor tomorrow about it.     Expected Outcomes Short: Continue to attend [facility] regularly for regular exercise and social engagement. Long: Continue to improve symptoms and manage a positive mental state. Short: Continue to exercise regularly to support mental health and notify staff of any changes. Long: maintain mental health and well being through teaching of rehab or prescribed medications independently. Short: Continue to exercise regularly to support mental health and notify staff of any changes, meet with doctor about sleep. Long: maintain mental health and well being through teaching of rehab or prescribed medications independently. and maintain sleep habits.     Interventions Encouraged to attend Cardiac Rehabilitation for the exercise Encouraged to attend Cardiac  Rehabilitation for the exercise Encouraged to attend Cardiac Rehabilitation for the exercise     Continue Psychosocial Services  Follow up required by staff Follow up required by staff Follow up required by staff              Psychosocial Discharge (Final Psychosocial Re-Evaluation):  Psychosocial Re-Evaluation - 11/07/23 1547       Psychosocial Re-Evaluation   Current issues with Current Sleep Concerns    Comments Patient reports no issues with their current mental states, stress, depression or anxiety. Will follow up with patient in a few weeks for any changes. Burel has been having trouble staying asleep. He takes meletonin and is going to talk to his doctor tomorrow about it.    Expected Outcomes Short: Continue to exercise regularly to support mental health and notify staff of any changes, meet with doctor about sleep. Long: maintain mental health and well being through teaching of rehab or prescribed medications independently. and maintain sleep habits.    Interventions Encouraged to attend Cardiac Rehabilitation for the exercise    Continue Psychosocial Services  Follow up required by staff             Vocational Rehabilitation: Provide vocational rehab assistance to qualifying candidates.   Vocational Rehab Evaluation & Intervention:  Vocational Rehab - 08/07/23 1308       Initial Vocational Rehab Evaluation & Intervention   Assessment shows need for Vocational Rehabilitation No             Education: Education Goals: Education classes will be provided on a variety of topics geared toward better understanding of heart health and risk factor modification. Participant will state understanding/return demonstration of topics presented as noted by education test scores.  Learning Barriers/Preferences:  Learning Barriers/Preferences - 08/07/23 1308       Learning Barriers/Preferences  Learning Barriers None    Learning Preferences Individual Instruction              General Cardiac Education Topics:  AED/CPR: - Group verbal and written instruction with the use of models to demonstrate the basic use of the AED with the basic ABC's of resuscitation.   Anatomy and Cardiac Procedures: - Group verbal and visual presentation and models provide information about basic cardiac anatomy and function. Reviews the testing methods done to diagnose heart disease and the outcomes of the test results. Describes the treatment choices: Medical Management, Angioplasty, or Coronary Bypass Surgery for treating various heart conditions including Myocardial Infarction, Angina, Valve Disease, and Cardiac Arrhythmias.  Written material given at graduation. Flowsheet Row Cardiac Rehab from 08/22/2023 in [facility] Cardiac and Pulmonary Rehab  Education need identified 08/22/23       Medication Safety: - Group verbal and visual instruction to review commonly prescribed medications for heart and lung disease. Reviews the medication, class of the drug, and side effects. Includes the steps to properly store meds and maintain the prescription regimen.  Written material given at graduation.   Intimacy: - Group verbal instruction through game format to discuss how heart and lung disease can affect sexual intimacy. Written material given at graduation..   Know Your Numbers and Heart Failure: - Group verbal and visual instruction to discuss disease risk factors for cardiac and pulmonary disease and treatment options.  Reviews associated critical values for Overweight/Obesity, Hypertension, Cholesterol, and Diabetes.  Discusses basics of heart failure: signs/symptoms and treatments.  Introduces Heart Failure Zone chart for action plan for heart failure.  Written material given at graduation. Flowsheet Row Cardiac Rehab from 11/12/2017 in [facility] Cardiac and Pulmonary Rehab  Date 11/05/17  Educator [facility]  Instruction Review Code 1- Verbalizes Understanding       Infection  Prevention: - Provides verbal and written material to individual with discussion of infection control including proper hand washing and proper equipment cleaning during exercise session. Flowsheet Row Cardiac Rehab from 08/22/2023 in [facility] Cardiac and Pulmonary Rehab  Date 08/22/23  Educator [facility]  Instruction Review Code 1- Verbalizes Understanding       Falls Prevention: - Provides verbal and written material to individual with discussion of falls prevention and safety. Flowsheet Row Cardiac Rehab from 08/22/2023 in [facility] Cardiac and Pulmonary Rehab  Date 08/22/23  Educator [facility]  Instruction Review Code 1- Verbalizes Understanding       Other: -Provides group and verbal instruction on various topics (see comments) Flowsheet Row Cardiac Rehab from 11/12/2017 in [facility] Cardiac and Pulmonary Rehab  Date 10/31/17  [Risk Factors and Know Your Numbers]  Educator [facility]  Instruction Review Code 1- Verbalizes Understanding       Knowledge Questionnaire Score:  Knowledge Questionnaire Score - 11/28/23 1609       Knowledge Questionnaire Score   Post Score 26/26             Core Components/Risk Factors/Patient Goals at Admission:  Personal Goals and Risk Factors at Admission - 08/22/23 1607       Core Components/Risk Factors/Patient Goals on Admission    Weight Management Yes;Weight Loss    Intervention Weight Management: Develop a combined nutrition and exercise program designed to reach desired caloric intake, while maintaining appropriate intake of nutrient and fiber, sodium and fats, and appropriate energy expenditure required for the weight goal.;Weight Management: Provide education and appropriate resources to help participant work on and attain dietary goals.;Weight Management/Obesity: Establish reasonable short term  and long term weight goals.;Obesity: Provide education and appropriate resources to help participant work on and attain dietary goals.    Admit Weight 221 lb 12.8 oz  (100.6 kg)    Goal Weight: Short Term 215 lb (97.5 kg)    Goal Weight: Long Term 210 lb (95.3 kg)    Expected Outcomes Short Term: Continue to assess and modify interventions until short term weight is achieved;Long Term: Adherence to nutrition and physical activity/exercise program aimed toward attainment of established weight goal;Weight Loss: Understanding of general recommendations for a balanced deficit meal plan, which promotes 1-2 lb weight loss per week and includes a negative energy balance of [phone] kcal/d;Understanding recommendations for meals to include 15-35% energy as protein, 25-35% energy from fat, 35-60% energy from carbohydrates, less than 200mg  of dietary cholesterol, 20-35 gm of total fiber daily;Understanding of distribution of calorie intake throughout the day with the consumption of 4-5 meals/snacks    Hypertension Yes    Intervention Provide education on lifestyle modifcations including regular physical activity/exercise, weight management, moderate sodium restriction and increased consumption of fresh fruit, vegetables, and low fat dairy, alcohol moderation, and smoking cessation.;Monitor prescription use compliance.    Expected Outcomes Short Term: Continued assessment and intervention until BP is < 140/30mm HG in hypertensive participants. < 130/58mm HG in hypertensive participants with diabetes, heart failure or chronic kidney disease.;Long Term: Maintenance of blood pressure at goal levels.    Lipids Yes    Intervention Provide education and support for participant on nutrition & aerobic/resistive exercise along with prescribed medications to achieve LDL 70mg , HDL >40mg .    Expected Outcomes Short Term: Participant states understanding of desired cholesterol values and is compliant with medications prescribed. Participant is following exercise prescription and nutrition guidelines.;Long Term: Cholesterol controlled with medications as prescribed, with individualized exercise  RX and with personalized nutrition plan. Value goals: LDL < 70mg , HDL > 40 mg.             Education:Diabetes - Individual verbal and written instruction to review signs/symptoms of diabetes, desired ranges of glucose level fasting, after meals and with exercise. Acknowledge that pre and post exercise glucose checks will be done for 3 sessions at entry of program.   Core Components/Risk Factors/Patient Goals Review:   Goals and Risk Factor Review     Row Name 09/06/23 1553 10/15/23 1541 11/07/23 1543         Core Components/Risk Factors/Patient Goals Review   Personal Goals Review Weight Management/Obesity;Other Weight Management/Obesity;Other Other     Review Kasey state that his weight has gone down. He has started taking Lasix and it is helping him breath better and get that weight off. He wants to reach a weight goal below 210 pounds. Bub was 221 pounds when he started rehab. He is now weighing 215 pounds. He would like to get to 210 pounds. Brennen has been increasing his loads in class as needed and is working to [employer] his breathing and fluid retention. He takes lasix 20mg  as day. He can take an extra lasix if his weight is up. Dantwan had a Zio heart monitor and is done with the test. He sent it back on 11/05/2023. He states that his chest/sternal incision has come open slightly. Doctors informed him to use antibiotics and to check back with them on Friday.     Expected Outcomes Short: lose a few pounds in a couple weeks. Long: reach weight. Short: lose weight and maintain fluid. Long: reach weight goal and keep weight  off independently. Short: meet with doctors about Incision. Long: maintain healing of incision.              Core Components/Risk Factors/Patient Goals at Discharge (Final Review):   Goals and Risk Factor Review - 11/07/23 1543       Core Components/Risk Factors/Patient Goals Review   Personal Goals Review Other    Review Joziel had a Zio heart monitor and is done  with the test. He sent it back on 11/05/2023. He states that his chest/sternal incision has come open slightly. Doctors informed him to use antibiotics and to check back with them on Friday.    Expected Outcomes Short: meet with doctors about Incision. Long: maintain healing of incision.             ITP Comments:  ITP Comments     Row Name 08/07/23 1323 08/22/23 1555 08/29/23 1104 08/29/23 1540 09/26/23 1228   ITP Comments Initial phone call completed. Diagnosis can be found in [facility] 6/28. EP Orientation scheduled for Wednesday 8/28 at 2:30. Completed and gym orientation. Initial ITP created and sent for review to Dr. Bethann Punches, Medical Director. 30 Day review completed. Medical Director ITP review done, changes made as directed, and signed approval by Medical Director.     new to program First full day of exercise!  Patient was oriented to gym and equipment including functions, settings, policies, and procedures.  Patient's individual exercise prescription and treatment plan were reviewed.  All starting workloads were established based on the results of the 6 minute walk test done at initial orientation visit.  The plan for exercise progression was also introduced and progression will be customized based on patient's performance and goals. 30 Day review completed. Medical Director ITP review done, changes made as directed, and signed approval by Medical Director.    new to program    Row Name 10/03/23 1115 10/24/23 1515 11/14/23 1113 11/28/23 1630     ITP Comments Kimmie called to tell us his Apple watch has shown several times that he was in atrial fib. He has been sick all week. He called to ask if he should come to CR. Advised him to call his doctor and review his illness, the Apple watch findings and see if he should be seen for further evaluation.  Will cancel today's visit and see what his doctor says about continuing CR with afib. 30 Day review completed. Medical Director ITP review done,  changes made as directed, and signed approval by Medical Director. 30 Day review completed. Medical Director ITP review done, changes made as directed, and signed approval by Medical Director. Durell graduated today from  rehab with 20 sessions completed.  Details of the patient's exercise prescription and what He needs to do in order to continue the prescription and progress were discussed with patient.  Patient was given a copy of prescription and goals.  Patient verbalized understanding. Rickardo plans to continue to exercise by using bike and treadmill at home.             Comments: Discharge ITP

## 2023-11-28 NOTE — Progress Notes (Signed)
Discharge Summary   Kedwin Whitecotton DOB: 01/29/2071  Adithya graduated today from  rehab with 20 sessions completed.  Details of the patient's exercise prescription and what He needs to do in order to continue the prescription and progress were discussed with patient.  Patient was given a copy of prescription and goals.  Patient verbalized understanding. Mickael plans to continue to exercise by using bike and treadmill at home.   6 Minute Walk     Row Name 08/22/23 1555 11/28/23 1600       6 Minute Walk   Phase Initial Discharge    Distance 1330 feet 1560 feet    Distance % Change -- 24.8 %    Distance Feet Change -- 330 ft    Walk Time 6 minutes 6 minutes    # of Rest Breaks 0 0    MPH 2.52 3    METS 3.76 4.12    RPE 9 0    Perceived Dyspnea  0 0    VO2 Peak 13.14 14.4    Symptoms No No    Resting HR 74 bpm 65 bpm    Resting BP 112/72 110/62    Resting Oxygen Saturation  96 % 98 %    Exercise Oxygen Saturation  during 6 min walk 98 % 98 %    Max Ex. HR 97 bpm 102 bpm    Max Ex. BP 122/70 106/60    2 Minute Post BP 104/62 --

## 2023-11-29 NOTE — Progress Notes (Signed)
Cardiology Clinic Note   Date: 11/30/2023 ID: Bobby [name], DOB 30-Jan-1971, MRN [id number]  Primary Cardiologist:  Julien Nordmann, MD  Patient Profile    Bobby [name] is a 52 y.o. male who presents to the clinic today for follow up after testing.     Past medical history significant for: CAD. PCI with stents x 2 to RCA 2003. LHC 07/13/2017 (STEMI): Ostial LAD 100% occlusion.  Mid ramus 70 to 80%.  Mild disease in LCx.  RCA occluded at prior angioplasty site with contralateral collaterals from LAD.  PCI with thrombectomy, angioplasty, DES 3.5 x 26 mm to ostial LAD.  Impella placed secondary to cardiogenic shock. LHC 08/21/2017 (angina): LM normal.  Ostial LAD with patent stent.  Ostial diagonal 20%.  Proximal ramus 50%.  Known RCA CTO. LHC 08/10/2020 (abnormal stress test): LM normal.  Ostial LAD with patent stent.  Ostial diagonal 20%.  OM1 20%.  RCA in-stent CTO with left to left collaterals filling up to mid RCA.  Normal LVEDP. R/LHC 06/22/2023 (unstable angina): Ostial LCx 90% (ostium appears jailed by proximal LAD stent extending back into the distal LMCA).  Ostial LAD with patent stent.  RCA in-stent CTO.  Severely reduced LV systolic function 25 to 35%.  Upper normal to mildly elevated left and right heart filling pressures LVEDP 16 mmHg, PCWP 16 mmHg, mean RA 7.  Low normal to mildly reduced cardiac output/index.  Angiographically normal abdominal aorta, iliac arteries, and right common femoral artery.  Recommend transfer to [name] for CT surgery consult. CABG x 1 06/25/2023: LIMA to OM1. Chronic systolic heart failure/ischemic cardiomyopathy. Echo 06/22/2023: EF 25 to 30%.  Akinesis of entire anterior wall and apical segment.  Mild LVH.  Grade I DD.  Mildly reduced RV function.  Mild RVH. PAF. Onset June 2024. Palpitations/PVCs. 7 day ZIO 11/08/2023: HR 51-135 bpm, average 76 bpm.  1 run of NSVT lasting 6 beats with max rate 135 bpm.  1 run of SVT lasting 8 beats with max rate 126 bpm.   Isolated PACs.  Frequent PVCs (17.6%), rare couplets and triplets.  Patient triggered events associated with normal sinus rhythm and PVCs. Hypertension. Hyperlipidemia. Lipid panel 06/13/2023: LDL 18, HDL 41, TG 59, total 71. GERD. Hypothyroidism. OSA. On CPAP. Former tobacco abuse.  In summary, patient with extensive cardiac history.  He was previously followed by Dr. Rosemary Holms.  He underwent stenting x 2 to RCA in 2003 (no records available).  In July 2018 he presented with an anterior STEMI and was taken emergently to the the Cath Lab.  LHC revealed in-stent CTO of RCA, ostial LAD 100%, diagonal 80%, OM 70% with left-to-right collaterals.  He underwent PCI with DES to ostial LAD.  Impella was placed secondary to cardiogenic shock.  Echo showed EF 30 to 35%.  Relook LHC August 2018 showed patent stent.  LHC August 2021 secondary to abnormal stress test was essentially unchanged from previous.  Echo July 2021 showed EF 40 to 45%.  Stress test August 2023 showed abnormal perfusion with fixed defect, EF 41%, intermediate risk.  Patient presented to the ED on 06/15/2023 with chest pain and found to have new onset A-fib.  He was started on Eliquis and spontaneously converted in the ER.  Troponin 97.  He was seen in the office by Dr. Mariah Milling for follow-up and continued to complain of chest pain.  Outpatient heart catheterization was scheduled.  Echo showed EF 25 to 30%.  LHC showed significant two-vessel CAD (details above) and  patient was transferred to [facility] for CTS evaluation.  He underwent CABG x 1 on 06/25/2023.   ED visit 08/28/2023 for shortness of breath.  Troponin 19>> 18.  BNP 455.  CTA negative for PE. Cardiology was consulted and recommended not starting diuretics given no pulmonary edema or clinical volume overload.  He was discharged.   Office visit on 09/03/2023 for hospital follow-up with complaints of orthopnea.  Given his symptoms and elevated BNP and the ED he was started on Lasix and  potassium.  He was maintaining sinus rhythm at that time.  Spironolactone could not be added secondary to low BP.  Office contacted by cardiac rehab RN on 10/18/2023 secondary to patient reporting increased shortness of breath and 3 pound weight gain overnight.  EKG obtained at cardiac rehab revealed PVCs.  Patient was scheduled to be evaluated in the office.     History of Present Illness    Bobby [name] is followed by Dr. Mariah Milling for the above outlined history.   Patient was last seen in the office by me on 10/19/2023 for evaluation of shortness of breath and palpitations.  He reported 2-week history of nighttime palpitations disrupting his sleep with accompanying shortness of breath.  He also reported intermittent orthostatic dizziness.  BP was soft at 98/70.  Orthostatic vitals were negative.  Carvedilol was stopped and he was started on Toprol to be taken in the evenings.  7-day ZIO showed 1 run of VT lasting 6 beats, 1 run of SVT lasting 8 beats, and frequent PVCs with 17.6% burden.  Patient graduated from cardiac rehab on 11/28/2023.  Today, patient is here alone. He reports feeling greatly improved since last visit. He has been able to sleep through the night since starting Toprol. His positional lightheadedness has improved since coming off the carvedilol. He denies chest pain. He has reduced his lasix dose to every 2-3 days based on weight gain. Last dose was Tuesday or Wednesday of this week. He has never had lower extremity edema but typically has abdominal bloating/fullness when he is holding onto extra fluid. He just completed cardiac rehab and plans to continue exercise at home on a stationary bike and treadmill. 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 He still has some fatigue. Discussed Zio results. All questions answered.     ROS: All other systems reviewed and are otherwise negative except as noted in History of Present Illness.  Studies Reviewed    EKG  Interpretation Date/Time:  Friday November 30 2023 13:58:29 EST Ventricular Rate:  88 PR Interval:  176 QRS Duration:  106 QT Interval:  410 QTC Calculation: 496 R Axis:   -48  Text Interpretation: Sinus rhythm with frequent Premature ventricular complexes Left axis deviation Incomplete left bundle branch block Minimal voltage criteria for LVH, may be normal variant ( Cornell product ) T wave abnormality, consider lateral ischemia When compared with ECG of 19-Oct-2023 14:50, PVCs more frequent Confirmed by Carlos Levering [phone]) on 11/30/2023 2:03:16 PM   Risk Assessment/Calculations     CHA2DS2-VASc Score = 3   This indicates a 3.2% annual risk of stroke. The patient's score is based upon: CHF History: 1 HTN History: 1 Diabetes History: 0 Stroke History: 0 Vascular Disease History: 1 Age Score: 0 Gender Score: 0             Physical Exam    VS:  BP 96/62 (BP Location: Left Arm, Patient Position: Sitting, Cuff Size: Normal)   Pulse  88   Ht 6\' 1"  (1.854 m)   Wt 221 lb 6.4 oz (100.4 kg)   SpO2 98%   BMI 29.21 kg/m  , BMI Body mass index is 29.21 kg/m.  GEN: Well nourished, well developed, in no acute distress. Neck: No JVD or carotid bruits. Cardiac:  RRR. Extrasystole. No murmurs. No rubs or gallops.   Respiratory:  Respirations regular and unlabored. Clear to auscultation without rales, wheezing or rhonchi. GI: Soft, nontender, nondistended. Extremities: Radials/DP/PT 2+ and equal bilaterally. No clubbing or cyanosis. No edema.  Skin: Warm and dry, no rash. Neuro: Strength intact.  Assessment & Plan   Palpitations/PVCs/Shortness of breath 7-day ZIO November 2024 showed 1 run of NSVT, 1 run of SVT, frequent PVCs with 17.6%, rare couplets and triplets.  Patient reports feeling improved since starting Toprol. He still has occasional palpitations but shortness of breath has resolved. He has been able to sleep through the night since starting the Toprol. EKG today  shows frequent PVCs.  -Continue Toprol. -Refer to EP.   CAD Extensive history with multiple PCI.  Most recently, CABG x 25 June 2023.  Patient denies chest pain, tightness of pressure. He graduated from cardiac rehab on 11/28/2023. He plans to continue exercise at home with a stationary bike and treadmill.  -Continue aspirin, Eliquis, Toprol, Repatha, atorvastatin, Zetia.    Chronic systolic heart failure/ischemic cardiomyopathy Echo June 2024 showed EF 25 to 30%.  Patient taking Lasix as needed, typically every 2-3 days. He has never had lower extremity edema and typically experiences abdominal fullness/bloating when holding onto extra fluid. Euvolemic and well compensated on exam. -Continue Lasix, Farxiga, Toprol. Cannot add spironolactone or ARB/ARNi secondary to soft BP.  -Continue to weigh daily.  -Repeat echo.  -BMP today.    PAF Onset June 2024.  No afib seen on recent Zio. Sinus with frequent PVCs on EKG today.  Denies spontaneous bleeding concerns. -Continue amiodarone, Toprol and Eliquis.    Hypertension BP today 96/62.  Patient reports improved positional lightheadedness since stopping carvedilol.  -Continue Toprol.   Hyperlipidemia LDL June 2024 18, at goal.   -Continue atorvastatin, Zetia, Repatha.  Disposition: BMP today. Echo. EP referral. Return in 3 months or sooner as needed.          Signed, Etta Grandchild. Rosine Solecki, DNP, NP-C

## 2023-11-30 ENCOUNTER — Encounter: Payer: Self-pay | Admitting: Student

## 2023-11-30 ENCOUNTER — Ambulatory Visit: Payer: 59 | Attending: Student | Admitting: Student

## 2023-11-30 VITALS — BP 96/62 | HR 88 | Ht 73.0 in | Wt 221.4 lb

## 2023-11-30 DIAGNOSIS — D6859 Other primary thrombophilia: Secondary | ICD-10-CM | POA: Diagnosis not present

## 2023-11-30 DIAGNOSIS — R002 Palpitations: Secondary | ICD-10-CM

## 2023-11-30 DIAGNOSIS — I5022 Chronic systolic (congestive) heart failure: Secondary | ICD-10-CM | POA: Diagnosis not present

## 2023-11-30 DIAGNOSIS — E785 Hyperlipidemia, unspecified: Secondary | ICD-10-CM

## 2023-11-30 DIAGNOSIS — I493 Ventricular premature depolarization: Secondary | ICD-10-CM | POA: Diagnosis not present

## 2023-11-30 DIAGNOSIS — I10 Essential (primary) hypertension: Secondary | ICD-10-CM | POA: Diagnosis not present

## 2023-11-30 DIAGNOSIS — I2510 Atherosclerotic heart disease of native coronary artery without angina pectoris: Secondary | ICD-10-CM

## 2023-11-30 DIAGNOSIS — Z79899 Other long term (current) drug therapy: Secondary | ICD-10-CM

## 2023-11-30 DIAGNOSIS — R0602 Shortness of breath: Secondary | ICD-10-CM | POA: Diagnosis not present

## 2023-11-30 NOTE — Patient Instructions (Signed)
Medication Instructions:  Your physician recommends that you continue on your current medications as directed. Please refer to the Current Medication list given to you today.   *If you need a refill on your cardiac medications before your next appointment, please call your pharmacy*   Lab Work: Your provider would like for you to have following labs drawn today (BMP).     Testing/Procedures: Your physician has requested that you have an echocardiogram. Echocardiography is a painless test that uses sound waves to create images of your heart. It provides your doctor with information about the size and shape of your heart and how well your heart's chambers and valves are working.   You may receive an ultrasound enhancing agent through an IV if needed to better visualize your heart during the echo. This procedure takes approximately one hour.  There are no restrictions for this procedure.  This will take place at [address] [facility] [address], [place] [address]  Please note: We ask at that you not bring children with you during ultrasound (echo/ vascular) testing. Due to room size and safety concerns, children are not allowed in the ultrasound rooms during exams. Our front office staff cannot provide observation of children in our lobby area while testing is being conducted. An adult accompanying a patient to their appointment will only be allowed in the ultrasound room at the discretion of the ultrasound technician under special circumstances. We apologize for any inconvenience.    Follow-Up: At [facility], you and your health needs are our priority.  As part of our continuing mission to provide you with exceptional heart care, we have created designated Provider Care Teams.  These Care Teams include your primary Cardiologist (physician) and Advanced Practice Providers (APPs -  Physician Assistants and Nurse Practitioners) who all work together to provide you with the  care you need, when you need it.  We recommend signing up for the patient portal called "MyChart".  Sign up information is provided on this After Visit Summary.  MyChart is used to connect with patients for Virtual Visits (Telemedicine).  Patients are able to view lab/test results, encounter notes, upcoming appointments, etc.  Non-urgent messages can be sent to your provider as well.   To learn more about what you can do with MyChart, go to [email or web address].    Your next appointment:   3 month(s)  Provider:   You may see Julien Nordmann, MD or one of the following Advanced Practice Providers on your designated Care Team:   Nicolasa Ducking, NP Eula Listen, PA-C Cadence Fransico Michael, PA-C Charlsie Quest, NP Carlos Levering, NP    Your physician recommends that you schedule a follow-up appointment with EP as soon as possible.

## 2023-12-01 LAB — BASIC METABOLIC PANEL
BUN/Creatinine Ratio: 13 (ref 9–20)
BUN: 18 mg/dL (ref 6–24)
CO2: 22 mmol/L (ref 20–29)
Calcium: 9.2 mg/dL (ref 8.7–10.2)
Chloride: 104 mmol/L (ref 96–106)
Creatinine, Ser: 1.39 mg/dL — ABNORMAL HIGH (ref 0.76–1.27)
Glucose: 91 mg/dL (ref 70–99)
Potassium: 5.1 mmol/L (ref 3.5–5.2)
Sodium: 140 mmol/L (ref 134–144)
eGFR: 60 mL/min/{1.73_m2} (ref >59)

## 2023-12-03 NOTE — Progress Notes (Signed)
Seen by patient Bobby [name] on 12/03/2023 8:24 AM  Pt called and he reports he is taking potassium only on the days he takes his lasix.  Pt denies seeking potassium sources in diet or supplements.

## 2023-12-05 ENCOUNTER — Other Ambulatory Visit: Payer: Self-pay

## 2023-12-05 ENCOUNTER — Other Ambulatory Visit: Payer: Self-pay | Admitting: Family Medicine

## 2023-12-05 DIAGNOSIS — G479 Sleep disorder, unspecified: Secondary | ICD-10-CM

## 2023-12-05 MED ORDER — ESZOPICLONE 1 MG PO TABS
1.0000 mg | ORAL_TABLET | Freq: Every evening | ORAL | 0 refills | Status: DC | PRN
  Filled 2023-12-05 – 2023-12-06 (×2): qty 30, 30d supply, fill #0

## 2023-12-06 ENCOUNTER — Other Ambulatory Visit: Payer: Self-pay

## 2023-12-14 ENCOUNTER — Encounter: Payer: Self-pay | Admitting: Family Medicine

## 2023-12-20 ENCOUNTER — Ambulatory Visit: Payer: 59 | Attending: Student

## 2023-12-20 DIAGNOSIS — I5022 Chronic systolic (congestive) heart failure: Secondary | ICD-10-CM | POA: Diagnosis not present

## 2023-12-20 LAB — ECHOCARDIOGRAM COMPLETE: S' Lateral: 5.5 cm

## 2023-12-20 MED ORDER — PERFLUTREN LIPID MICROSPHERE
1.0000 mL | INTRAVENOUS | Status: AC | PRN
Start: 2023-12-20 — End: 2023-12-20
  Administered 2023-12-20: 3 mL via INTRAVENOUS

## 2023-12-21 ENCOUNTER — Other Ambulatory Visit: Payer: Self-pay

## 2023-12-24 ENCOUNTER — Other Ambulatory Visit: Payer: Self-pay

## 2023-12-24 ENCOUNTER — Encounter: Payer: Self-pay | Admitting: Internal Medicine

## 2023-12-24 ENCOUNTER — Ambulatory Visit: Payer: 59 | Attending: Internal Medicine | Admitting: Internal Medicine

## 2023-12-24 VITALS — BP 110/68 | HR 68 | Ht 73.0 in | Wt 221.8 lb

## 2023-12-24 DIAGNOSIS — Z951 Presence of aortocoronary bypass graft: Secondary | ICD-10-CM

## 2023-12-24 DIAGNOSIS — I493 Ventricular premature depolarization: Secondary | ICD-10-CM | POA: Insufficient documentation

## 2023-12-24 DIAGNOSIS — I5020 Unspecified systolic (congestive) heart failure: Secondary | ICD-10-CM

## 2023-12-24 DIAGNOSIS — I255 Ischemic cardiomyopathy: Secondary | ICD-10-CM | POA: Diagnosis not present

## 2023-12-24 DIAGNOSIS — I480 Paroxysmal atrial fibrillation: Secondary | ICD-10-CM

## 2023-12-24 DIAGNOSIS — I25110 Atherosclerotic heart disease of native coronary artery with unstable angina pectoris: Secondary | ICD-10-CM

## 2023-12-24 DIAGNOSIS — I5042 Chronic combined systolic (congestive) and diastolic (congestive) heart failure: Secondary | ICD-10-CM | POA: Diagnosis not present

## 2023-12-24 MED ORDER — MEXILETINE HCL 150 MG PO CAPS
150.0000 mg | ORAL_CAPSULE | Freq: Two times a day (BID) | ORAL | 1 refills | Status: DC
  Filled 2023-12-24: qty 180, 90d supply, fill #0
  Filled 2024-03-20: qty 180, 90d supply, fill #1

## 2023-12-24 NOTE — Patient Instructions (Signed)
Medication Instructions:  Your physician has recommended you make the following change in your medication:   ** Begin Mexiletine 150mg  - 1 capsule by mouth twice daily.  *If you need a refill on your cardiac medications before your next appointment, please call your pharmacy*   Lab Work: None ordered.  If you have labs (blood work) drawn today and your tests are completely normal, you will receive your results only by: MyChart Message (if you have MyChart) OR A paper copy in the mail If you have any lab test that is abnormal or we need to change your treatment, we will call you to review the results.   Testing/Procedures: None ordered.    Follow-Up: At [facility], you and your health needs are our priority.  As part of our continuing mission to provide you with exceptional heart care, we have created designated Provider Care Teams.  These Care Teams include your primary Cardiologist (physician) and Advanced Practice Providers (APPs -  Physician Assistants and Nurse Practitioners) who all work together to provide you with the care you need, when you need it.  We recommend signing up for the patient portal called "MyChart".  Sign up information is provided on this After Visit Summary.  MyChart is used to connect with patients for Virtual Visits (Telemedicine).  Patients are able to view lab/test results, encounter notes, upcoming appointments, etc.  Non-urgent messages can be sent to your provider as well.   To learn more about what you can do with MyChart, go to [email or web address].    Your next appointment:   Dr Graciela Husbands will follow up with you regarding life vest, blood thinner and ablation - If you have not heard from him by Friday please send me a MyChart message.

## 2023-12-24 NOTE — Progress Notes (Signed)
ELECTROPHYSIOLOGY CONSULT NOTE  Patient ID: Bobby [name], MRN: [id number], DOB/AGE: 07/16/1971 52 y.o. Admit date: (Not on file) Date of Consult: 12/24/2023  Primary Physician: Dana Allan, MD Primary Cardiologist: Knute Neu     Bobby [name] is a 52 y.o. male who is being seen today for the evaluation of PVC and cardiomyopathy at the request of TG.    HPI Bobby [name] is a 52 y.o. male referred for issues with frequent PVCs in the setting of ischemic cardiomyopathy for which he has undergone stenting in the past and then bypass surgery 6/24 PVC burden 2021 with an ejection fraction of 25-40% was less than 1%.  11/24 demonstrated 17% with a right bundle inferior axis morphology and a relatively narrow QRSd.  These have been quite symptomatic both with palpitations as well as with a could be shortness of breath is also his impression that since his bypass surgery his functional tolerance has gradually been worsening with more shortness of breath fatigue.     Exercise capacity is quite limited.  With dyspnea with any incline or any sustained effort.  Orthostatic lightheadedness and some lightheadedness as noted with palpitations and PVCs.  Monitor also showed nonsustained ventricular tachycardia.  PVCs have been discombobulated at night potentially lying on his left side  Atrial fibrillation identified 6/24 started on anticoagulation with apixaban converted spontaneously. Amiodarone was started at that time and has been maintained since then.  The PVCs have persisted.    GDMT has been limited because of hypotension he is currently only on an SGLT2  DATE TEST EF   7/21 Echo  40-45%   8/21 LHC  LAD stent patent RCA ISR-total        6/24 LHC   25-30 % Cx 90; RCA-T LAD stenbt  6/24 Echo   25-30 %   12/24 Echo   25-30%    Date Cr K Hgb  12/24 1.39 5.1(10/24) 13.4         Thromboembolic risk factors , HTN-1, Vasc disease -1, CHF-1) for a CHADSVASc Score of >=3    Past Medical  History:  Diagnosis Date   Acute MI anterior wall first episode care (HCC) 07/13/2017   Occluded ostial LAD S/P 3.5x26 Onyx DES. RI 80%. RCA Occluded at prior stent site with collaterals    Cardiogenic shock (HCC) 07/13/2017   CHF (congestive heart failure) (HCC)    Graves disease    Hypertension    MI (myocardial infarction) (HCC)    Thyroid disease    thyroid irradiated per pt      Surgical History:  Past Surgical History:  Procedure Laterality Date   ABDOMINAL AORTOGRAM N/A 06/22/2023   Procedure: ABDOMINAL AORTOGRAM;  Surgeon: Yvonne Kendall, MD;  Location: [facility] INVASIVE CV LAB;  Service: Cardiovascular;  Laterality: N/A;   CORONARY ANGIOPLASTY WITH STENT PLACEMENT     CORONARY ARTERY BYPASS GRAFT N/A 06/25/2023   Procedure: CORONARY ARTERY BYPASS GRAFTING (CABG) x1 USING LEFT INTERNAL MAMMARY ARTERY (LIMA);  Surgeon: Loreli Slot, MD;  Location: [facility] OR;  Service: Open Heart Surgery;  Laterality: N/A;   CORONARY/GRAFT ACUTE MI REVASCULARIZATION N/A 07/13/2017   Procedure: Coronary/Graft Acute MI Revascularization;  Surgeon: Yates Decamp, MD;  Location: [facility] INVASIVE CV LAB;  Service: Cardiovascular;  Laterality: N/A;   ESOPHAGOGASTRODUODENOSCOPY N/A 06/30/2020   Procedure: ESOPHAGOGASTRODUODENOSCOPY (EGD);  Surgeon: Toledo, Boykin Nearing, MD;  Location: [facility] ENDOSCOPY;  Service: Gastroenterology;  Laterality: N/A;   LEFT HEART CATH AND CORONARY ANGIOGRAPHY N/A 07/13/2017  Procedure: Left Heart Cath and Coronary Angiography;  Surgeon: Yates Decamp, MD;  Location: [facility] INVASIVE CV LAB;  Service: Cardiovascular;  Laterality: N/A;   LEFT HEART CATH AND CORONARY ANGIOGRAPHY N/A 08/21/2017   Procedure: LEFT HEART CATH AND CORONARY ANGIOGRAPHY;  Surgeon: Elder Negus, MD;  Location: [facility] INVASIVE CV LAB;  Service: Cardiovascular;  Laterality: N/A;   LEFT HEART CATH AND CORONARY ANGIOGRAPHY N/A 08/10/2020   Procedure: LEFT HEART CATH AND CORONARY ANGIOGRAPHY;  Surgeon: Elder Negus, MD;   Location: [facility] INVASIVE CV LAB;  Service: Cardiovascular;  Laterality: N/A;   RIGHT/LEFT HEART CATH AND CORONARY ANGIOGRAPHY N/A 06/22/2023   Procedure: RIGHT/LEFT HEART CATH AND CORONARY ANGIOGRAPHY;  Surgeon: Yvonne Kendall, MD;  Location: [facility] INVASIVE CV LAB;  Service: Cardiovascular;  Laterality: N/A;   SKIN GRAFT     TEE WITHOUT CARDIOVERSION N/A 06/25/2023   Procedure: TRANSESOPHAGEAL ECHOCARDIOGRAM;  Surgeon: Loreli Slot, MD;  Location: [facility] OR;  Service: Open Heart Surgery;  Laterality: N/A;   VENTRICULAR ASSIST DEVICE INSERTION N/A 07/13/2017   Procedure: Ventricular Assist Device Insertion;  Surgeon: Yates Decamp, MD;  Location: [facility] INVASIVE CV LAB;  Service: Cardiovascular;  Laterality: N/A;     Home Meds: Current Meds  Medication Sig   amiodarone (PACERONE) 200 MG tablet Take 1 tablet (200 mg total) by mouth daily.   apixaban (ELIQUIS) 5 MG TABS tablet Take 1 tablet (5 mg total) by mouth 2 (two) times daily.   aspirin 81 MG chewable tablet Chew 1 tablet (81 mg total) by mouth daily.   atorvastatin (LIPITOR) 20 MG tablet Take 1 tablet (20 mg total) by mouth daily.   cephALEXin (KEFLEX) 500 MG capsule Take 1 capsule (500 mg total) by mouth 3 (three) times daily for 7 days.   dapagliflozin propanediol (FARXIGA) 10 MG TABS tablet Take 1 tablet (10 mg total) by mouth daily before breakfast.   eszopiclone (LUNESTA) 1 MG TABS tablet Take 1 tablet (1 mg total) by mouth at bedtime as needed for sleep. Take immediately before bedtime   Evolocumab (REPATHA SURECLICK) 140 MG/ML SOAJ Inject 140mg  into the skin once every 14 days.   ezetimibe (ZETIA) 10 MG tablet Take 1 tablet (10 mg total) by mouth daily.   furosemide (LASIX) 20 MG tablet Take 1 tablet (20 mg) by mouth once every other day   levothyroxine (SYNTHROID) 137 MCG tablet Take 1 tablet (137 mcg total) by mouth daily.   metoprolol succinate (TOPROL-XL) 25 MG 24 hr tablet Take 1 tablet (25 mg total) by mouth at bedtime.   mexiletine  (MEXITIL) 150 MG capsule Take 1 capsule (150 mg total) by mouth 2 (two) times daily.   pantoprazole (PROTONIX) 40 MG tablet Take 1 tablet (40 mg total) by mouth 2 (two) times daily.   Potassium Chloride ER 20 MEQ TBCR Take 20 mEq by mouth daily.   sildenafil (VIAGRA) 100 MG tablet Take 1 tablet (100 mg total) by mouth as needed for erectile dysfunction. TAKE 1 (ONE) TABLET AS NEEDED. DO NOT USE AT SAME TIME AS NITRO    Allergies: No Known Allergies  Social History   Socioeconomic History   Marital status: Married    Spouse name: Not on file   Number of children: 2   Years of education: Not on file   Highest education level: Not on file  Occupational History   Not on file  Tobacco Use   Smoking status: Former    Current packs/day: 0.00    Average packs/day:  0.5 packs/day for 20.0 years (10.0 ttl pk-yrs)    Types: Cigarettes    Start date: 07/13/1997    Quit date: 07/13/2017    Years since quitting: 6.4   Smokeless tobacco: Never   Tobacco comments:    quit 2012  Vaping Use   Vaping status: Never Used  Substance and Sexual Activity   Alcohol use: Not Currently    Comment: occ   Drug use: Not Currently   Sexual activity: Yes  Other Topics Concern   Not on file  Social History Narrative   Lives with fiance, working FT , disesel equip, right handed, 2 kids, college edu   Social Drivers of Corporate investment banker Strain: Not on file  Food Insecurity: No Food Insecurity (06/22/2023)   Hunger Vital Sign    Worried About Running Out of Food in the Last Year: Never true    Ran Out of Food in the Last Year: Never true  Transportation Needs: No Transportation Needs (06/22/2023)   PRAPARE - Administrator, Civil Service (Medical): No    Lack of Transportation (Non-Medical): No  Physical Activity: Sufficiently Active (05/23/2023)   Exercise Vital Sign    Days of Exercise per Week: 3 days    Minutes of Exercise per Session: 60 min  Stress: Stress Concern Present  (05/23/2023)   [employer] of Occupational Health - Occupational Stress Questionnaire    Feeling of Stress : To some extent  Social Connections: Not on file  Intimate Partner Violence: Not At Risk (06/22/2023)   Humiliation, Afraid, Rape, and Kick questionnaire    Fear of Current or Ex-Partner: No    Emotionally Abused: No    Physically Abused: No    Sexually Abused: No     Family History  Problem Relation Age of Onset   Valvular heart disease Mother    Heart disease Father 67       CABG   Hypertension Father    Heart disease Paternal Grandmother      ROS:  Please see the history of present illness.     All other systems reviewed and negative.    Physical Exam: Blood pressure 110/68, pulse 68, height 6\' 1"  (1.854 m), weight 221 lb 12.8 oz (100.6 kg). General: Well developed, well nourished male in no acute distress. Head: Normocephalic, atraumatic, sclera non-icteric, no xanthomas, nares are without discharge. EENT: normal  Lymph Nodes:  none Neck: Negative for carotid bruits. JVD not elevated. Back:without scoliosis kyphosis Lungs: Clear bilaterally to auscultation without wheezes, rales, or rhonchi. Breathing is unlabored. Heart: RRR with S1 S2. No murmur . No rubs, or gallops appreciated. Abdomen: Soft, non-tender, non-distended with normoactive bowel sounds. No hepatomegaly. No rebound/guarding. No obvious abdominal masses. Msk:  Strength and tone appear normal for age. Extremities: No clubbing or cyanosis. No edema.  Distal pedal pulses are 2+ and equal bilaterally. Skin: Warm and Dry Neuro: Alert and oriented X 3. CN III-XII intact Grossly normal sensory and motor function . Psych:  Responds to questions appropriately with a normal affect.        EKG: Sinus at 68 Interval 17/10/45 Poor R wave progression PVCs/PJCs (QRS duration 140 msec)   Assessment and Plan:  PVCs-frequent right bundle inferior axis QRSd 140 ms  Cardiomyopathy ischemic question  arrhythmic contribution  Congestive heart failure class III  Sleep apnea-treated  Hypotension limiting GDMT    Atrial fibrillation-paroxysmal  High Risk Medication Surveillance-amiodarone  Left ventricular apical thrombus   The  patient has PVCs of high-frequency seemingly emerging from the left ventricular septum I would just base of the QRS duration and axis.  Given their symptom and frequency burden, and not withstanding their narrowness, I would recommend ablative therapy particularly in light of the resistance to amiodarone.  I will reach out to Dr. Leonia Reeves and ask for his input.  If not he, we will discuss this with Dr. Wynelle Link down at [facility].  The presence of left ventricular thrombus has important impact on the timing of any procedure.  In the interim, we will add mexiletine to the amiodarone to see if we can suppress the PVCs and increase the amiodarone if necessary.  Have reached out to colleagues, with the clot.  To have some pressure component in the context of Eliquis anticoagulation we will discontinue this and put him on warfarin.  Given his cardiomyopathy, the anxieties of sudden cardiac death which are real, we will reach out for LifeVest wearable defibrillator as the workup unfolds.  With his atrial fibrillation, continue anticoagulation as above.  GDMT is limited by his blood pressure.   Sherryl Manges

## 2023-12-25 ENCOUNTER — Other Ambulatory Visit: Payer: Self-pay

## 2023-12-25 ENCOUNTER — Telehealth: Payer: Self-pay | Admitting: Internal Medicine

## 2023-12-25 DIAGNOSIS — I480 Paroxysmal atrial fibrillation: Secondary | ICD-10-CM

## 2023-12-25 DIAGNOSIS — I493 Ventricular premature depolarization: Secondary | ICD-10-CM

## 2023-12-25 MED ORDER — AMIODARONE HCL 200 MG PO TABS: 400.0000 mg | ORAL_TABLET | Freq: Every day | ORAL | Status: DC

## 2023-12-25 MED ORDER — WARFARIN SODIUM 5 MG PO TABS
5.0000 mg | ORAL_TABLET | Freq: Every day | ORAL | 0 refills | Status: DC
  Filled 2023-12-25: qty 60, 30d supply, fill #0

## 2023-12-25 NOTE — Telephone Encounter (Signed)
 There is an urgent referral for the patient to see the Coumadin  Clinic as a new patient. There is two new patient slots blocked on 01/02/24. Would it be okay to schedule the patient on 01/02/24 in the new slots or should the patient be schedule for 01/09/24 at 2:30 PM?

## 2023-12-25 NOTE — Telephone Encounter (Signed)
Pt has an upcoming appointment on 01/02/24 in [facility].

## 2023-12-25 NOTE — Addendum Note (Signed)
Addended by: Alois Cliche on: 12/25/2023 08:31 AM   Modules accepted: Orders

## 2023-12-25 NOTE — Telephone Encounter (Signed)
[employer] Life Vest needs: Insurance Cards  Order  Chart Notes Echo/Cath Report done in the last 6 months  Faxed to [phone] or email [email or web address]

## 2023-12-26 ENCOUNTER — Other Ambulatory Visit: Payer: Self-pay | Admitting: Internal Medicine

## 2023-12-27 ENCOUNTER — Other Ambulatory Visit: Payer: Self-pay

## 2023-12-27 ENCOUNTER — Other Ambulatory Visit: Payer: Self-pay | Admitting: Internal Medicine

## 2023-12-27 DIAGNOSIS — I420 Dilated cardiomyopathy: Secondary | ICD-10-CM | POA: Diagnosis not present

## 2023-12-27 DIAGNOSIS — I252 Old myocardial infarction: Secondary | ICD-10-CM | POA: Diagnosis not present

## 2023-12-27 MED ORDER — AMIODARONE HCL 200 MG PO TABS
400.0000 mg | ORAL_TABLET | Freq: Every day | ORAL | 3 refills | Status: DC
  Filled 2023-12-27 (×2): qty 180, 90d supply, fill #0

## 2023-12-27 NOTE — Telephone Encounter (Signed)
 This is a Estate agent.

## 2023-12-28 ENCOUNTER — Other Ambulatory Visit: Payer: Self-pay

## 2024-01-02 ENCOUNTER — Ambulatory Visit: Payer: Commercial Managed Care - PPO | Attending: Internal Medicine

## 2024-01-02 DIAGNOSIS — I25110 Atherosclerotic heart disease of native coronary artery with unstable angina pectoris: Secondary | ICD-10-CM

## 2024-01-02 DIAGNOSIS — Z5181 Encounter for therapeutic drug level monitoring: Secondary | ICD-10-CM

## 2024-01-02 DIAGNOSIS — I480 Paroxysmal atrial fibrillation: Secondary | ICD-10-CM | POA: Diagnosis not present

## 2024-01-02 DIAGNOSIS — Z951 Presence of aortocoronary bypass graft: Secondary | ICD-10-CM

## 2024-01-02 DIAGNOSIS — I255 Ischemic cardiomyopathy: Secondary | ICD-10-CM

## 2024-01-02 DIAGNOSIS — I5042 Chronic combined systolic (congestive) and diastolic (congestive) heart failure: Secondary | ICD-10-CM

## 2024-01-02 DIAGNOSIS — I5020 Unspecified systolic (congestive) heart failure: Secondary | ICD-10-CM

## 2024-01-02 DIAGNOSIS — I493 Ventricular premature depolarization: Secondary | ICD-10-CM

## 2024-01-02 LAB — POCT INR: INR: 3.9 — AB (ref 2.0–3.0)

## 2024-01-02 NOTE — Patient Instructions (Signed)
 HOLD TODAY ONLY THEN START TAKING 2.5 mg  DAILY, EXCEPT 5 mg EVERY MONDAY, WEDNESDAY and FRIDAY.  INR in 1 week. Amiodarone  400 mg bid 1/3-1/17  A full discussion of the nature of anticoagulants has been carried out.  A benefit risk analysis has been presented to the patient, so that they understand the justification for choosing anticoagulation at this time. The need for frequent and regular monitoring, precise dosage adjustment and compliance is stressed.  Side effects of potential bleeding are discussed.  The patient should avoid any OTC items containing aspirin  or ibuprofen , and should avoid great swings in general diet.  Avoid alcohol consumption.  Call if any signs of abnormal bleeding.  [phone]

## 2024-01-03 ENCOUNTER — Other Ambulatory Visit: Payer: Self-pay | Admitting: Family Medicine

## 2024-01-04 ENCOUNTER — Other Ambulatory Visit: Payer: Self-pay

## 2024-01-04 MED ORDER — LEVOTHYROXINE SODIUM 137 MCG PO TABS
137.0000 ug | ORAL_TABLET | Freq: Every day | ORAL | 0 refills | Status: DC
  Filled 2024-01-04: qty 90, 90d supply, fill #0

## 2024-01-09 ENCOUNTER — Other Ambulatory Visit: Payer: Self-pay

## 2024-01-09 ENCOUNTER — Telehealth: Payer: Commercial Managed Care - PPO | Admitting: Internal Medicine

## 2024-01-09 ENCOUNTER — Encounter: Payer: Self-pay | Admitting: Family Medicine

## 2024-01-09 ENCOUNTER — Ambulatory Visit: Payer: Commercial Managed Care - PPO | Attending: Cardiovascular Disease

## 2024-01-09 DIAGNOSIS — Z5181 Encounter for therapeutic drug level monitoring: Secondary | ICD-10-CM | POA: Diagnosis not present

## 2024-01-09 DIAGNOSIS — I493 Ventricular premature depolarization: Secondary | ICD-10-CM

## 2024-01-09 DIAGNOSIS — I255 Ischemic cardiomyopathy: Secondary | ICD-10-CM | POA: Diagnosis not present

## 2024-01-09 DIAGNOSIS — F339 Major depressive disorder, recurrent, unspecified: Secondary | ICD-10-CM | POA: Diagnosis not present

## 2024-01-09 DIAGNOSIS — I25110 Atherosclerotic heart disease of native coronary artery with unstable angina pectoris: Secondary | ICD-10-CM

## 2024-01-09 DIAGNOSIS — I480 Paroxysmal atrial fibrillation: Secondary | ICD-10-CM | POA: Diagnosis not present

## 2024-01-09 DIAGNOSIS — I5042 Chronic combined systolic (congestive) and diastolic (congestive) heart failure: Secondary | ICD-10-CM

## 2024-01-09 DIAGNOSIS — I5020 Unspecified systolic (congestive) heart failure: Secondary | ICD-10-CM

## 2024-01-09 DIAGNOSIS — Z951 Presence of aortocoronary bypass graft: Secondary | ICD-10-CM

## 2024-01-09 LAB — POCT INR: INR: 3.1 — AB (ref 2.0–3.0)

## 2024-01-09 MED ORDER — SERTRALINE HCL 50 MG PO TABS
50.0000 mg | ORAL_TABLET | Freq: Every day | ORAL | 0 refills | Status: DC
  Filled 2024-01-09: qty 30, 30d supply, fill #0

## 2024-01-09 NOTE — Assessment & Plan Note (Signed)
>>  ASSESSMENT AND PLAN FOR DEPRESSION, MAJOR, RECURRENT (HCC) WRITTEN ON 01/09/2024  3:06 PM BY NARENDRA, NISCHAL, MD  -Patient presents via video visit today for worsening depression and anxiety.  Patient states that his mood has worsened over the last few weeks and is likely related to his underlying health issues.  He remains worried about dying suddenly and states that he has a lot to live for including kids and grandkids -He does have a depressed mood and affect -He denies any suicidal ideation -His PHQ-9 score was 22 and his GAD-7 score was elevated at 20 -I offered referral to psychiatry or a therapist but he declined at this time.  I did explain to him that cognitive behavioral therapy may be helpful in his condition but he states that he does not have the time to go to behavioral health. -Will start the patient on sertraline  50 mg daily at this time.  Patient states that he was previously on Cymbalta  but that he did not feel any emotions while on it (was neither happy nor sad) and took himself off this. -I explained to the patient that will take 3 to 4 weeks for him to start feeling an effect from this treatment.  He expressed understanding and is in agreement with starting this medication at this time -He will follow-up with Dr. Hope on February 5 and will reassess symptoms at that time. -No further workup for now.  Patient instructed to call us  if he has any worsening symptoms

## 2024-01-09 NOTE — Addendum Note (Signed)
 Addended by: Reynolds Cea A on: 01/09/2024 03:14 PM   Modules accepted: Orders

## 2024-01-09 NOTE — Progress Notes (Signed)
 Virtual Visit via Video Note  I connected with [name] on 01/09/24 at 2:30 pm by a video enabled telemedicine application and verified that I am speaking with the correct person using two identifiers.  Location patient: home Location provider:work or home office Persons participating in the virtual visit: patient, provider  I discussed the limitations of evaluation and management by telemedicine and the availability of in person appointments. The patient expressed understanding and agreed to proceed.   HPI:  Patient states that he has had multiple cardiac issues including a recent open heart surgery and currently is using a LifeVest.  He states that his cardiac issues have been affecting his mental health.  He states that he has been more anxious lately and is worried that he might suddenly passed away.  He also noted that he has been more irritable lately and has been more emotional.  He states that his sleep is slightly improved since starting the Lunesta  but still wakes up 2-3 times a night.  He has no interest or pleasure in doing things and feels depressed most of the time.  States that he has no energy and this is a combination of his heart issues as well as his mood.  Denies any suicidal ideation.  States that he has a lot to live for including his kids and grandkids.  Was told recently that he has a third grandchild on the way.  ROS: See pertinent positives and negatives per HPI.  Past Medical History:  Diagnosis Date   Acute MI anterior wall first episode care (HCC) 07/13/2017   Occluded ostial LAD S/P 3.5x26 Onyx DES. RI 80%. RCA Occluded at prior stent site with collaterals    Cardiogenic shock (HCC) 07/13/2017   CHF (congestive heart failure) (HCC)    Graves disease    Hypertension    MI (myocardial infarction) (HCC)    Thyroid  disease    thyroid  irradiated per pt    Past Surgical History:  Procedure Laterality Date   ABDOMINAL AORTOGRAM N/A 06/22/2023   Procedure:  ABDOMINAL AORTOGRAM;  Surgeon: Sammy Crisp, MD;  Location: [facility] INVASIVE CV LAB;  Service: Cardiovascular;  Laterality: N/A;   CORONARY ANGIOPLASTY WITH STENT PLACEMENT     CORONARY ARTERY BYPASS GRAFT N/A 06/25/2023   Procedure: CORONARY ARTERY BYPASS GRAFTING (CABG) x1 USING LEFT INTERNAL MAMMARY ARTERY (LIMA);  Surgeon: Zelphia Higashi, MD;  Location: [facility] OR;  Service: Open Heart Surgery;  Laterality: N/A;   CORONARY/GRAFT ACUTE MI REVASCULARIZATION N/A 07/13/2017   Procedure: Coronary/Graft Acute MI Revascularization;  Surgeon: Knox Perl, MD;  Location: [facility] INVASIVE CV LAB;  Service: Cardiovascular;  Laterality: N/A;   ESOPHAGOGASTRODUODENOSCOPY N/A 06/30/2020   Procedure: ESOPHAGOGASTRODUODENOSCOPY (EGD);  Surgeon: Toledo, Alphonsus Jeans, MD;  Location: [facility] ENDOSCOPY;  Service: Gastroenterology;  Laterality: N/A;   LEFT HEART CATH AND CORONARY ANGIOGRAPHY N/A 07/13/2017   Procedure: Left Heart Cath and Coronary Angiography;  Surgeon: Knox Perl, MD;  Location: [facility] INVASIVE CV LAB;  Service: Cardiovascular;  Laterality: N/A;   LEFT HEART CATH AND CORONARY ANGIOGRAPHY N/A 08/21/2017   Procedure: LEFT HEART CATH AND CORONARY ANGIOGRAPHY;  Surgeon: Cody Das, MD;  Location: [facility] INVASIVE CV LAB;  Service: Cardiovascular;  Laterality: N/A;   LEFT HEART CATH AND CORONARY ANGIOGRAPHY N/A 08/10/2020   Procedure: LEFT HEART CATH AND CORONARY ANGIOGRAPHY;  Surgeon: Cody Das, MD;  Location: [facility] INVASIVE CV LAB;  Service: Cardiovascular;  Laterality: N/A;   RIGHT/LEFT HEART CATH AND CORONARY ANGIOGRAPHY N/A 06/22/2023  Procedure: RIGHT/LEFT HEART CATH AND CORONARY ANGIOGRAPHY;  Surgeon: Sammy Crisp, MD;  Location: [facility] INVASIVE CV LAB;  Service: Cardiovascular;  Laterality: N/A;   SKIN GRAFT     TEE WITHOUT CARDIOVERSION N/A 06/25/2023   Procedure: TRANSESOPHAGEAL ECHOCARDIOGRAM;  Surgeon: Zelphia Higashi, MD;  Location: [facility] OR;  Service: Open Heart Surgery;  Laterality: N/A;    VENTRICULAR ASSIST DEVICE INSERTION N/A 07/13/2017   Procedure: Ventricular Assist Device Insertion;  Surgeon: Knox Perl, MD;  Location: [facility] INVASIVE CV LAB;  Service: Cardiovascular;  Laterality: N/A;    Family History  Problem Relation Age of Onset   Valvular heart disease Mother    Heart disease Father 97       CABG   Hypertension Father    Heart disease Paternal Grandmother     SOCIAL HX:    Current Outpatient Medications:    sertraline  (ZOLOFT ) 50 MG tablet, Take 1 tablet (50 mg total) by mouth daily., Disp: 30 tablet, Rfl: 0   amiodarone  (PACERONE ) 200 MG tablet, Take 2 tablets (400 mg total) by mouth daily., Disp: 180 tablet, Rfl: 3   apixaban  (ELIQUIS ) 5 MG TABS tablet, Take 1 tablet (5 mg total) by mouth 2 (two) times daily., Disp: 180 tablet, Rfl: 3   aspirin  81 MG chewable tablet, Chew 1 tablet (81 mg total) by mouth daily., Disp: 30 tablet, Rfl: 11   atorvastatin  (LIPITOR) 20 MG tablet, Take 1 tablet (20 mg total) by mouth daily., Disp: 90 tablet, Rfl: 3   cephALEXin  (KEFLEX ) 500 MG capsule, Take 1 capsule (500 mg total) by mouth 3 (three) times daily for 7 days., Disp: 21 capsule, Rfl: 0   dapagliflozin  propanediol (FARXIGA ) 10 MG TABS tablet, Take 1 tablet (10 mg total) by mouth daily before breakfast., Disp: 90 tablet, Rfl: 3   eszopiclone  (LUNESTA ) 1 MG TABS tablet, Take 1 tablet (1 mg total) by mouth at bedtime as needed for sleep. Take immediately before bedtime, Disp: 30 tablet, Rfl: 0   Evolocumab  (REPATHA  SURECLICK) 140 MG/ML SOAJ, Inject 140mg  into the skin once every 14 days., Disp: 2 mL, Rfl: 3   ezetimibe  (ZETIA ) 10 MG tablet, Take 1 tablet (10 mg total) by mouth daily., Disp: 90 tablet, Rfl: 3   furosemide  (LASIX ) 20 MG tablet, Take 1 tablet (20 mg) by mouth once every other day, Disp: , Rfl:    furosemide  (LASIX ) 40 MG tablet, Take 1 tablet (40 mg total) by mouth daily as needed. Take for shortness of breath or abdominal swelling., Disp: 90 tablet, Rfl: 3    levothyroxine  (SYNTHROID ) 137 MCG tablet, Take 1 tablet (137 mcg total) by mouth daily., Disp: 90 tablet, Rfl: 0   metoprolol  succinate (TOPROL -XL) 25 MG 24 hr tablet, Take 1 tablet (25 mg total) by mouth at bedtime., Disp: 90 tablet, Rfl: 0   mexiletine (MEXITIL ) 150 MG capsule, Take 1 capsule (150 mg total) by mouth 2 (two) times daily., Disp: 180 capsule, Rfl: 1   pantoprazole  (PROTONIX ) 40 MG tablet, Take 1 tablet (40 mg total) by mouth 2 (two) times daily., Disp: 60 tablet, Rfl: 3   Potassium Chloride  ER 20 MEQ TBCR, Take 20 mEq by mouth daily., Disp: , Rfl:    potassium chloride  SA (KLOR-CON  M20) 20 MEQ tablet, Take 1 tablet (20 mEq total) by mouth daily as needed. Take as needed with Lasix ., Disp: 90 tablet, Rfl: 3   sildenafil  (VIAGRA ) 100 MG tablet, Take 1 tablet (100 mg total) by mouth as needed for erectile dysfunction.  TAKE 1 (ONE) TABLET AS NEEDED. DO NOT USE AT SAME TIME AS NITRO, Disp: 30 tablet, Rfl: 3   warfarin (COUMADIN ) 5 MG tablet, Take 1-2 tablets (5-10 mg total) by mouth daily or as prescribed by Coumadin  Clinic, Disp: 60 tablet, Rfl: 0  EXAM:  VITALS per patient if applicable:  GENERAL: alert, oriented, appears well and in no acute distress  HEENT: atraumatic, conjunctiva clear, no obvious abnormalities on inspection of external nose and ears  NECK: normal movements of the head and neck  LUNGS: on inspection no signs of respiratory distress, breathing rate appears normal, no obvious gross SOB, gasping or wheezing  CV: no obvious cyanosis  MS: moves all visible extremities without noticeable abnormality  PSYCH/NEURO: Patient has a depressed mood and affect.  No suicidal or homicidal thoughts or ideation.  ASSESSMENT AND PLAN:  Discussed the following assessment and plan:  Episode of recurrent major depressive disorder, unspecified depression episode severity (HCC) - Plan: sertraline  (ZOLOFT ) 50 MG tablet      I discussed the assessment and treatment plan  with the patient. The patient was provided an opportunity to ask questions and all were answered. The patient agreed with the plan and demonstrated an understanding of the instructions.   The patient was advised to call back or seek an in-person evaluation if the symptoms worsen or if the condition fails to improve as anticipated.  I provided 24 minutes of non-face-to-face time during this encounter.   Liyla Radliff, MD

## 2024-01-09 NOTE — Patient Instructions (Signed)
 START TAKING 2.5 mg  DAILY, EXCEPT 5 mg EVERY MONDAY  and FRIDAY.  INR in 1 week. Amiodarone  400 mg bid 1/3-1/17 [phone]

## 2024-01-09 NOTE — Assessment & Plan Note (Signed)
-  Patient presents via video visit today for worsening depression and anxiety.  Patient states that his mood has worsened over the last few weeks and is likely related to his underlying health issues.  He remains worried about dying suddenly and states that he has a lot to live for including kids and grandkids -He does have a depressed mood and affect -He denies any suicidal ideation -His PHQ-9 score was 22 and his GAD-7 score was elevated at 20 -I offered referral to psychiatry or a therapist but he declined at this time.  I did explain to him that cognitive behavioral therapy may be helpful in his condition but he states that he does not have the time to go to behavioral health. -Will start the patient on sertraline  50 mg daily at this time.  Patient states that he was previously on Cymbalta  but that he did not feel any emotions while on it (was neither happy nor sad) and took himself off this. -I explained to the patient that will take 3 to 4 weeks for him to start feeling an effect from this treatment.  He expressed understanding and is in agreement with starting this medication at this time -He will follow-up with Dr. Sueanne Emerald on February 5 and will reassess symptoms at that time. -No further workup for now.  Patient instructed to call us  if he has any worsening symptoms

## 2024-01-10 ENCOUNTER — Other Ambulatory Visit: Payer: Self-pay | Admitting: Student

## 2024-01-11 ENCOUNTER — Other Ambulatory Visit: Payer: Self-pay

## 2024-01-11 ENCOUNTER — Other Ambulatory Visit: Payer: Self-pay | Admitting: Student

## 2024-01-11 MED FILL — Metoprolol Succinate Tab ER 24HR 25 MG (Tartrate Equiv): ORAL | 90 days supply | Qty: 90 | Fill #0 | Status: AC

## 2024-01-16 ENCOUNTER — Ambulatory Visit: Payer: Commercial Managed Care - PPO | Attending: Cardiovascular Disease

## 2024-01-16 DIAGNOSIS — I5020 Unspecified systolic (congestive) heart failure: Secondary | ICD-10-CM

## 2024-01-16 DIAGNOSIS — I255 Ischemic cardiomyopathy: Secondary | ICD-10-CM | POA: Diagnosis not present

## 2024-01-16 DIAGNOSIS — I25110 Atherosclerotic heart disease of native coronary artery with unstable angina pectoris: Secondary | ICD-10-CM

## 2024-01-16 DIAGNOSIS — I5042 Chronic combined systolic (congestive) and diastolic (congestive) heart failure: Secondary | ICD-10-CM

## 2024-01-16 DIAGNOSIS — Z5181 Encounter for therapeutic drug level monitoring: Secondary | ICD-10-CM | POA: Diagnosis not present

## 2024-01-16 DIAGNOSIS — I493 Ventricular premature depolarization: Secondary | ICD-10-CM

## 2024-01-16 DIAGNOSIS — Z951 Presence of aortocoronary bypass graft: Secondary | ICD-10-CM

## 2024-01-16 DIAGNOSIS — I480 Paroxysmal atrial fibrillation: Secondary | ICD-10-CM

## 2024-01-16 LAB — POCT INR: INR: 2.2 (ref 2.0–3.0)

## 2024-01-16 NOTE — Patient Instructions (Signed)
Continue taking 2.5 mg  DAILY, EXCEPT 5 mg EVERY MONDAY and FRIDAY.  INR in 1 week. Amiodarone 400 mg Daily [phone]

## 2024-01-23 ENCOUNTER — Ambulatory Visit: Payer: Commercial Managed Care - PPO | Attending: Cardiovascular Disease

## 2024-01-23 DIAGNOSIS — I5042 Chronic combined systolic (congestive) and diastolic (congestive) heart failure: Secondary | ICD-10-CM

## 2024-01-23 DIAGNOSIS — I5020 Unspecified systolic (congestive) heart failure: Secondary | ICD-10-CM

## 2024-01-23 DIAGNOSIS — I493 Ventricular premature depolarization: Secondary | ICD-10-CM

## 2024-01-23 DIAGNOSIS — I480 Paroxysmal atrial fibrillation: Secondary | ICD-10-CM | POA: Diagnosis not present

## 2024-01-23 DIAGNOSIS — Z5181 Encounter for therapeutic drug level monitoring: Secondary | ICD-10-CM | POA: Diagnosis not present

## 2024-01-23 DIAGNOSIS — I255 Ischemic cardiomyopathy: Secondary | ICD-10-CM | POA: Diagnosis not present

## 2024-01-23 DIAGNOSIS — Z951 Presence of aortocoronary bypass graft: Secondary | ICD-10-CM

## 2024-01-23 DIAGNOSIS — I25110 Atherosclerotic heart disease of native coronary artery with unstable angina pectoris: Secondary | ICD-10-CM

## 2024-01-23 LAB — POCT INR: INR: 2 (ref 2.0–3.0)

## 2024-01-23 NOTE — Patient Instructions (Signed)
TAKE 1 TABLET TODAY ONLY THEN  Continue taking 2.5 mg  DAILY, EXCEPT 5 mg EVERY MONDAY and FRIDAY.  INR in 2 weeks. Amiodarone 400 mg Daily [phone]

## 2024-01-25 ENCOUNTER — Other Ambulatory Visit: Payer: Self-pay | Admitting: Cardiovascular Disease

## 2024-01-25 ENCOUNTER — Other Ambulatory Visit: Payer: Self-pay

## 2024-01-25 MED ORDER — REPATHA SURECLICK 140 MG/ML ~~LOC~~ SOAJ
140.0000 mg | SUBCUTANEOUS | 2 refills | Status: DC
  Filled 2024-01-25: qty 2, 28d supply, fill #0
  Filled 2024-02-15: qty 2, 28d supply, fill #1
  Filled 2024-03-20: qty 2, 28d supply, fill #2

## 2024-01-25 NOTE — Telephone Encounter (Signed)
Last office visit: 11/30/23 with plan to f/u 3 months. next office visit: 03/03/24

## 2024-01-27 DIAGNOSIS — I252 Old myocardial infarction: Secondary | ICD-10-CM | POA: Diagnosis not present

## 2024-01-27 DIAGNOSIS — I420 Dilated cardiomyopathy: Secondary | ICD-10-CM | POA: Diagnosis not present

## 2024-01-29 ENCOUNTER — Other Ambulatory Visit: Payer: Self-pay

## 2024-01-29 ENCOUNTER — Ambulatory Visit: Payer: Commercial Managed Care - PPO | Attending: Internal Medicine

## 2024-01-29 DIAGNOSIS — I493 Ventricular premature depolarization: Secondary | ICD-10-CM

## 2024-01-30 ENCOUNTER — Other Ambulatory Visit: Payer: Self-pay

## 2024-01-30 ENCOUNTER — Ambulatory Visit: Payer: Commercial Managed Care - PPO | Admitting: Family Medicine

## 2024-01-30 ENCOUNTER — Encounter: Payer: Self-pay | Admitting: Family Medicine

## 2024-01-30 VITALS — BP 92/70 | HR 58 | Temp 97.9°F | Resp 18 | Ht 73.0 in | Wt 226.4 lb

## 2024-01-30 DIAGNOSIS — I480 Paroxysmal atrial fibrillation: Secondary | ICD-10-CM | POA: Diagnosis not present

## 2024-01-30 DIAGNOSIS — E039 Hypothyroidism, unspecified: Secondary | ICD-10-CM | POA: Diagnosis not present

## 2024-01-30 DIAGNOSIS — F339 Major depressive disorder, recurrent, unspecified: Secondary | ICD-10-CM | POA: Diagnosis not present

## 2024-01-30 DIAGNOSIS — G479 Sleep disorder, unspecified: Secondary | ICD-10-CM

## 2024-01-30 DIAGNOSIS — I10 Essential (primary) hypertension: Secondary | ICD-10-CM | POA: Diagnosis not present

## 2024-01-30 DIAGNOSIS — F39 Unspecified mood [affective] disorder: Secondary | ICD-10-CM | POA: Diagnosis not present

## 2024-01-30 DIAGNOSIS — I4891 Unspecified atrial fibrillation: Secondary | ICD-10-CM | POA: Diagnosis not present

## 2024-01-30 DIAGNOSIS — D6869 Other thrombophilia: Secondary | ICD-10-CM

## 2024-01-30 LAB — COMPREHENSIVE METABOLIC PANEL
ALT: 36 U/L (ref 0–53)
AST: 21 U/L (ref 0–37)
Albumin: 4.4 g/dL (ref 3.5–5.2)
Alkaline Phosphatase: 76 U/L (ref 39–117)
BUN: 17 mg/dL (ref 6–23)
CO2: 24 meq/L (ref 19–32)
Calcium: 8.9 mg/dL (ref 8.4–10.5)
Chloride: 106 meq/L (ref 96–112)
Creatinine, Ser: 1.36 mg/dL (ref 0.40–1.50)
GFR: 59.13 mL/min — ABNORMAL LOW (ref >60.00)
Glucose, Bld: 111 mg/dL — ABNORMAL HIGH (ref 70–99)
Potassium: 4.6 meq/L (ref 3.5–5.1)
Sodium: 139 meq/L (ref 135–145)
Total Bilirubin: 0.6 mg/dL (ref 0.2–1.2)
Total Protein: 7 g/dL (ref 6.0–8.3)

## 2024-01-30 LAB — TSH: TSH: 7.92 u[IU]/mL — ABNORMAL HIGH (ref 0.35–5.50)

## 2024-01-30 MED ORDER — SERTRALINE HCL 100 MG PO TABS
100.0000 mg | ORAL_TABLET | Freq: Every day | ORAL | 3 refills | Status: DC
  Filled 2024-01-30: qty 90, 90d supply, fill #0
  Filled 2024-05-05: qty 90, 90d supply, fill #1

## 2024-01-30 MED ORDER — LEVOTHYROXINE SODIUM 137 MCG PO TABS
137.0000 ug | ORAL_TABLET | Freq: Every day | ORAL | 3 refills | Status: DC
  Filled 2024-01-30: qty 90, 90d supply, fill #0

## 2024-01-30 NOTE — Patient Instructions (Addendum)
 It was a pleasure meeting you today. Thank you for allowing me to take part in your health care.  Our goals for today as we discussed include:  Increase Zoloft  to 100 mg daily  [facility]  [address]  [place] [address]  [phone]   [facility] [place]  [address] [place] [address] [phone]    For Mental Health Concerns  [facility] [phone] Address: [address] [place], [place] [address] Hours: Open 24/7, No appointment required.    We will get some labs today.  If they are abnormal or we need to do something about them, I will call you.  If they are normal, I will send you a message on MyChart (if it is active) or a letter in the mail.  If you don't hear from us  in 2 weeks, please call the office at the number below.     This is a list of the screening recommended for you and due dates:  Health Maintenance  Topic Date Due   Pneumococcal Vaccination (1 of 2 - PCV) Never done   Flu Shot  03/24/2024*   Colon Cancer Screening  03/01/2027   DTaP/Tdap/Td vaccine (3 - Td or Tdap) 11/16/2030   Hepatitis C Screening  Completed   HIV Screening  Completed   Zoster (Shingles) Vaccine  Completed   HPV Vaccine  Aged Out   COVID-19 Vaccine  Discontinued  *Topic was postponed. The date shown is not the original due date.     Follow up in 3 months or sooner if needed   If you have any questions or concerns, please do not hesitate to call the office at [phone].  I look forward to our next visit and until then take care and stay safe.  Regards,   Glenys Ferrari, MD   [facility]

## 2024-01-30 NOTE — Progress Notes (Signed)
 SUBJECTIVE:   Chief Complaint  Patient presents with   Depression   Anxiety   HPI Presents for follow up mood disorder  Discussed the use of AI scribe software for clinical note transcription with the patient, who gave verbal consent to proceed.  History of Present Illness   Bobby [name] is a 53 year old male with depression, anxiety, and cardiac issues who presents for follow-up.  He has been experiencing cardiac issues since October. He wore a heart monitor for three to four weeks, which activated five or six times. An echocardiogram revealed blood clots in his heart, indicating that Eliquis  was ineffective. He is now on warfarin and is under the care of a cardiologist and an electrophysiologist. He was initially on a high dose of amiodarone  (400 mg twice daily) for two weeks, which he found challenging, but the second week was more tolerable. He is now on 400 mg daily. He feels like he has a 'brick on his chest' 90% of the time. He switched from carvedilol  to metoprolol  and takes Lasix  40 mg as needed. He experiences lightheadedness when standing and has a consistently low blood pressure around 90/60 mmHg. He has a history of PVCs, with 17-18% of his heartbeats being PVCs over a week. His heart monitor activates during physical activity, such as playing with his dog or working in the garage.  He is experiencing ongoing depression and anxiety. Sertraline  (Zoloft ) has provided some relief but not significantly. He has been on a low dose for about three weeks. He has not started therapy due to other ongoing issues. He feels overwhelmed by his current situation and believes his mental health will not improve until his other health issues are resolved.  He reports poor sleep quality and has tried Lunesta  and Xanax , both of which were ineffective. Lunesta  made him irritable, and he prefers using Benadryl  for sleep. He uses a CPAP machine but struggles with it due to being a side sleeper. He feels  fatigued and mentally exhausted, significantly impacting his work life. He is concerned about job security due to frequent absences.  He has been on amiodarone  since July 1st following surgery, and his thyroid  was checked in October and found to be normal. He is on levothyroxine  137 mcg.  He is on multiple medications including metoprolol , Lasix , and Farxiga . He is experiencing fatigue and work-related stress, which may be impacting his overall health.      PERTINENT PMH / PSH: As above  OBJECTIVE:  BP 92/70   Pulse (!) 58   Temp 97.9 F (36.6 C)   Resp 18   Ht 6' 1 (1.854 m)   Wt 226 lb 6 oz (102.7 kg)   SpO2 97%   BMI 29.87 kg/m    Physical Exam Vitals reviewed.  Constitutional:      General: He is not in acute distress.    Appearance: Normal appearance. He is normal weight. He is not ill-appearing, toxic-appearing or diaphoretic.  Eyes:     General:        Right eye: No discharge.        Left eye: No discharge.  Cardiovascular:     Rate and Rhythm: Normal rate. Rhythm regularly irregular.     Heart sounds: Normal heart sounds.     Comments: Wearing Life Vest Pulmonary:     Effort: Pulmonary effort is normal.     Breath sounds: Normal breath sounds.  Abdominal:     General: Bowel sounds are normal.  Musculoskeletal:        General: Normal range of motion.     Cervical back: Normal range of motion.  Skin:    General: Skin is warm and dry.  Neurological:     Mental Status: He is alert and oriented to person, place, and time. Mental status is at baseline.  Psychiatric:        Mood and Affect: Mood normal.        Behavior: Behavior normal.        Thought Content: Thought content normal.        Judgment: Judgment normal.           01/30/2024    8:36 AM 01/09/2024    3:13 PM 11/28/2023    4:07 PM 09/06/2023    3:55 PM 08/22/2023    4:11 PM  Depression screen PHQ 2/9  Decreased Interest 3 3 1  0 1  Down, Depressed, Hopeless 3 3 0 0 1  PHQ - 2 Score 6 6 1  0 2   Altered sleeping 3 3 1 1 2   Tired, decreased energy 3 3 1 1 1   Change in appetite 2 3 0 0 0  Feeling bad or failure about yourself  2 3 1  0 0  Trouble concentrating 3 3 1 2 2   Moving slowly or fidgety/restless 1 1 0 0 0  Suicidal thoughts 0 0 0 0 0  PHQ-9 Score 20 22 5 4 7   Difficult doing work/chores Extremely dIfficult Extremely dIfficult Not difficult at all Somewhat difficult Somewhat difficult      01/30/2024    8:37 AM 01/09/2024    3:13 PM 06/20/2023    1:08 PM 05/23/2023    2:33 PM  GAD 7 : Generalized Anxiety Score  Nervous, Anxious, on Edge 3 3 3 1   Control/stop worrying 1 3 0 0  Worry too much - different things 2 3 1  0  Trouble relaxing 3 3 3 2   Restless 3 3 3 2   Easily annoyed or irritable 3 3 1 2   Afraid - awful might happen 2 2 0 1  Total GAD 7 Score 17 20 11 8   Anxiety Difficulty Extremely difficult Extremely difficult Somewhat difficult Somewhat difficult    ASSESSMENT/PLAN:  Mood disorder (HCC) Assessment & Plan: Partial response to Sertraline . Patient is experiencing significant life stressors and is not currently engaged in therapy. -Increase Sertraline  to 100mg  daily. -Encourage patient to consider therapy when ready. -Mental health resources provided  Orders: -     Sertraline  HCl; Take 1 tablet (100 mg total) by mouth daily.  Dispense: 90 tablet; Refill: 3  Paroxysmal atrial fibrillation [facility]) Assessment & Plan: Patient has a history of PVCs and is currently on Amiodarone . Patient reports feeling PVCs and has a LifeVest that has gone off multiple times. Patient is also on Warfarin. -Continue Amiodarone  200 mg daily -Carvedilol  switched to Metoprolol  by Cardiology -Advise patient to follow up with cardiology regarding LifeVest alerts and ongoing symptoms.   Episode of recurrent major depressive disorder, unspecified depression episode severity (HCC) -     Sertraline  HCl; Take 1 tablet (100 mg total) by mouth daily.  Dispense: 90 tablet; Refill:  3  Acquired hypothyroidism Assessment & Plan: Patient is on Levothyroxine  . Amiodarone  can interfere with thyroid  function. -Check TSH, liver function, and kidney function today.  Orders: -     Levothyroxine  Sodium; Take 1 tablet (137 mcg total) by mouth daily.  Dispense: 90 tablet; Refill: 3 -  TSH  Primary hypertension Assessment & Plan: Chronic.  Well-controlled on current medications. Continue Metoprolol  XL 25 mg daily Continue Mexitil  150 mg BID Follows with cardiology  Orders: -     Comprehensive metabolic panel  Sleep disturbance Assessment & Plan: Poor response to Lunesta , reported irritability. Patient reports some benefit from Benadryl . -Discontinue Lunesta . -Advise patient to continue Benadryl  as needed for sleep.   Hypercoagulable state due to atrial fibrillation, unspecified type [facility]) Assessment & Plan: Currently on Warfarin and follows with Coumadin  clinic -Continue to follow with Cardiology     PDMP reviewed  Return in about 3 months (around 04/28/2024) for PCP, mood .  Glenys Ferrari, MD

## 2024-01-30 NOTE — Assessment & Plan Note (Signed)
 Currently on Warfarin and follows with Coumadin  clinic -Continue to follow with Cardiology

## 2024-01-30 NOTE — Assessment & Plan Note (Signed)
 Poor response to Lunesta , reported irritability. Patient reports some benefit from Benadryl . -Discontinue Lunesta . -Advise patient to continue Benadryl  as needed for sleep.

## 2024-01-30 NOTE — Assessment & Plan Note (Signed)
 Partial response to Sertraline . Patient is experiencing significant life stressors and is not currently engaged in therapy. -Increase Sertraline  to 100mg  daily. -Encourage patient to consider therapy when ready. -Mental health resources provided

## 2024-01-30 NOTE — Assessment & Plan Note (Signed)
 Patient is on Levothyroxine  137mcg. Amiodarone  can interfere with thyroid  function. -Check TSH, liver function, and kidney function today.

## 2024-01-30 NOTE — Assessment & Plan Note (Signed)
>>  ASSESSMENT AND PLAN FOR SLEEP DISTURBANCE WRITTEN ON 01/30/2024  1:15 PM BY WALSH, TANYA, MD  Poor response to Lunesta , reported irritability. Patient reports some benefit from Benadryl . -Discontinue Lunesta . -Advise patient to continue Benadryl  as needed for sleep.

## 2024-01-30 NOTE — Assessment & Plan Note (Addendum)
 Patient has a history of PVCs and is currently on Amiodarone . Patient reports feeling PVCs and has a LifeVest that has gone off multiple times. Patient is also on Warfarin. -Continue Amiodarone  200 mg daily -Carvedilol  switched to Metoprolol  by Cardiology -Advise patient to follow up with cardiology regarding LifeVest alerts and ongoing symptoms.

## 2024-01-30 NOTE — Assessment & Plan Note (Signed)
 Chronic.  Well-controlled on current medications. Continue Metoprolol  XL 25 mg daily Continue Mexitil  150 mg BID Follows with cardiology

## 2024-01-31 ENCOUNTER — Telehealth: Payer: Commercial Managed Care - PPO | Admitting: Physician Assistant

## 2024-01-31 ENCOUNTER — Other Ambulatory Visit: Payer: Self-pay

## 2024-01-31 DIAGNOSIS — J208 Acute bronchitis due to other specified organisms: Secondary | ICD-10-CM

## 2024-01-31 DIAGNOSIS — B9689 Other specified bacterial agents as the cause of diseases classified elsewhere: Secondary | ICD-10-CM | POA: Diagnosis not present

## 2024-01-31 MED ORDER — DOXYCYCLINE HYCLATE 100 MG PO TABS
100.0000 mg | ORAL_TABLET | Freq: Two times a day (BID) | ORAL | 0 refills | Status: DC
  Filled 2024-01-31: qty 14, 7d supply, fill #0

## 2024-01-31 MED ORDER — BENZONATATE 100 MG PO CAPS
100.0000 mg | ORAL_CAPSULE | Freq: Three times a day (TID) | ORAL | 0 refills | Status: DC | PRN
  Filled 2024-01-31: qty 30, 10d supply, fill #0

## 2024-01-31 NOTE — Progress Notes (Signed)
 I have spent 5 minutes in review of e-visit questionnaire, review and updating patient chart, medical decision making and response to patient.   Piedad Climes, PA-C

## 2024-01-31 NOTE — Progress Notes (Signed)

## 2024-02-01 ENCOUNTER — Encounter: Payer: Self-pay | Admitting: Internal Medicine

## 2024-02-06 ENCOUNTER — Ambulatory Visit: Payer: Commercial Managed Care - PPO | Attending: Cardiovascular Disease

## 2024-02-06 DIAGNOSIS — I493 Ventricular premature depolarization: Secondary | ICD-10-CM

## 2024-02-06 DIAGNOSIS — I255 Ischemic cardiomyopathy: Secondary | ICD-10-CM

## 2024-02-06 DIAGNOSIS — I480 Paroxysmal atrial fibrillation: Secondary | ICD-10-CM | POA: Diagnosis not present

## 2024-02-06 DIAGNOSIS — Z951 Presence of aortocoronary bypass graft: Secondary | ICD-10-CM

## 2024-02-06 DIAGNOSIS — Z5181 Encounter for therapeutic drug level monitoring: Secondary | ICD-10-CM

## 2024-02-06 DIAGNOSIS — I25110 Atherosclerotic heart disease of native coronary artery with unstable angina pectoris: Secondary | ICD-10-CM

## 2024-02-06 DIAGNOSIS — I5042 Chronic combined systolic (congestive) and diastolic (congestive) heart failure: Secondary | ICD-10-CM

## 2024-02-06 DIAGNOSIS — I5020 Unspecified systolic (congestive) heart failure: Secondary | ICD-10-CM

## 2024-02-06 LAB — POCT INR: INR: 2.2 (ref 2.0–3.0)

## 2024-02-06 NOTE — Patient Instructions (Signed)
Continue taking 2.5 mg  DAILY, EXCEPT 5 mg EVERY MONDAY and FRIDAY.  INR in 4 weeks. Amiodarone 400 mg Daily [phone]

## 2024-02-08 ENCOUNTER — Other Ambulatory Visit: Payer: Self-pay

## 2024-02-08 ENCOUNTER — Other Ambulatory Visit: Payer: Self-pay | Admitting: Family Medicine

## 2024-02-08 DIAGNOSIS — K219 Gastro-esophageal reflux disease without esophagitis: Secondary | ICD-10-CM

## 2024-02-08 DIAGNOSIS — I493 Ventricular premature depolarization: Secondary | ICD-10-CM | POA: Diagnosis not present

## 2024-02-08 MED ORDER — PANTOPRAZOLE SODIUM 40 MG PO TBEC
40.0000 mg | DELAYED_RELEASE_TABLET | Freq: Two times a day (BID) | ORAL | 3 refills | Status: DC
  Filled 2024-02-08: qty 60, 30d supply, fill #0
  Filled 2024-03-10: qty 60, 30d supply, fill #1
  Filled 2024-04-13: qty 60, 30d supply, fill #2
  Filled 2024-05-12: qty 60, 30d supply, fill #3

## 2024-02-14 ENCOUNTER — Other Ambulatory Visit: Payer: Self-pay

## 2024-02-14 ENCOUNTER — Ambulatory Visit: Payer: Commercial Managed Care - PPO | Attending: Internal Medicine | Admitting: Internal Medicine

## 2024-02-14 ENCOUNTER — Encounter: Payer: Self-pay | Admitting: Internal Medicine

## 2024-02-14 VITALS — BP 108/80 | HR 67 | Ht 73.0 in | Wt 226.5 lb

## 2024-02-14 DIAGNOSIS — I5042 Chronic combined systolic (congestive) and diastolic (congestive) heart failure: Secondary | ICD-10-CM

## 2024-02-14 DIAGNOSIS — I25110 Atherosclerotic heart disease of native coronary artery with unstable angina pectoris: Secondary | ICD-10-CM

## 2024-02-14 DIAGNOSIS — I255 Ischemic cardiomyopathy: Secondary | ICD-10-CM

## 2024-02-14 DIAGNOSIS — I493 Ventricular premature depolarization: Secondary | ICD-10-CM

## 2024-02-14 DIAGNOSIS — E039 Hypothyroidism, unspecified: Secondary | ICD-10-CM | POA: Diagnosis not present

## 2024-02-14 DIAGNOSIS — I480 Paroxysmal atrial fibrillation: Secondary | ICD-10-CM

## 2024-02-14 DIAGNOSIS — R002 Palpitations: Secondary | ICD-10-CM | POA: Diagnosis not present

## 2024-02-14 MED ORDER — LEVOTHYROXINE SODIUM 150 MCG PO TABS
150.0000 ug | ORAL_TABLET | Freq: Every day | ORAL | 1 refills | Status: DC
  Filled 2024-02-14: qty 90, 90d supply, fill #0

## 2024-02-14 MED ORDER — AMIODARONE HCL 200 MG PO TABS
200.0000 mg | ORAL_TABLET | Freq: Every day | ORAL | 3 refills | Status: DC
  Filled 2024-02-14: qty 180, 180d supply, fill #0
  Filled 2024-02-29: qty 90, 90d supply, fill #0
  Filled 2024-03-04: qty 180, 180d supply, fill #0
  Filled 2024-03-04: qty 90, 90d supply, fill #0

## 2024-02-14 NOTE — Progress Notes (Signed)
Patient Care Team: Dana Allan, MD as PCP - General (Family Medicine) Reather Littler, MD (Inactive) as PCP - Endocrinology (Endocrinology) Antonieta Iba, MD as PCP - Cardiology (Cardiology) End, Cristal Deer, MD as Consulting Physician (Cardiology)   HPI  Bobby [name] is a 53 y.o. male seen in followup for freq PVCs with a right bundle inferior axis morphology and a relatively narrow QRSd.    Has CAD with stenting in the past and then bypass surgery 6/24  PVC burden 2021 with an ejection fraction of 25-40% was less than 1%.  11/24 demonstrated 17% while taking amio; Mex added 12/24  With his LV apical clot, NOAC was discontinued and he was started on warfarin >> repeat imaging is pending  LIfeVest added     Still w palpitations; sometimes feels ok but othertimes beset with weakness-- some sob and LH No nausea  Rx hypothyroidism  Date PVCs  5/21 <1%  11/24 17%  2/25 <1%       DATE TEST EF    7/21 Echo  40-45%    8/21 LHC   LAD stent patent RCA ISR-total            6/24 LHC   25-30 % Cx 90; RCA-T LAD stenbt  6/24 Echo   25-30 %    12/24 Echo   25-30% LV thrombus      Date Cr K Hgb TSH LFTs  12/24 1.39 5.1(10/24) 13.4     2/25    4.6   7.92 36    Thromboembolic risk factors , HTN-1, Vasc disease -1, CHF-1) for a CHADSVASc Score of >=3       Records and Results Reviewed   Past Medical History:  Diagnosis Date   Acute MI anterior wall first episode care (HCC) 07/13/2017   Occluded ostial LAD S/P 3.5x26 Onyx DES. RI 80%. RCA Occluded at prior stent site with collaterals    Cardiogenic shock (HCC) 07/13/2017   CHF (congestive heart failure) (HCC)    Graves disease    Hypertension    MI (myocardial infarction) (HCC)    Thyroid disease    thyroid irradiated per pt    Past Surgical History:  Procedure Laterality Date   ABDOMINAL AORTOGRAM N/A 06/22/2023   Procedure: ABDOMINAL AORTOGRAM;  Surgeon: Yvonne Kendall, MD;  Location: [facility] INVASIVE CV  LAB;  Service: Cardiovascular;  Laterality: N/A;   CORONARY ANGIOPLASTY WITH STENT PLACEMENT     CORONARY ARTERY BYPASS GRAFT N/A 06/25/2023   Procedure: CORONARY ARTERY BYPASS GRAFTING (CABG) x1 USING LEFT INTERNAL MAMMARY ARTERY (LIMA);  Surgeon: Loreli Slot, MD;  Location: [facility] OR;  Service: Open Heart Surgery;  Laterality: N/A;   CORONARY/GRAFT ACUTE MI REVASCULARIZATION N/A 07/13/2017   Procedure: Coronary/Graft Acute MI Revascularization;  Surgeon: Yates Decamp, MD;  Location: [facility] INVASIVE CV LAB;  Service: Cardiovascular;  Laterality: N/A;   ESOPHAGOGASTRODUODENOSCOPY N/A 06/30/2020   Procedure: ESOPHAGOGASTRODUODENOSCOPY (EGD);  Surgeon: Toledo, Boykin Nearing, MD;  Location: [facility] ENDOSCOPY;  Service: Gastroenterology;  Laterality: N/A;   LEFT HEART CATH AND CORONARY ANGIOGRAPHY N/A 07/13/2017   Procedure: Left Heart Cath and Coronary Angiography;  Surgeon: Yates Decamp, MD;  Location: [facility] INVASIVE CV LAB;  Service: Cardiovascular;  Laterality: N/A;   LEFT HEART CATH AND CORONARY ANGIOGRAPHY N/A 08/21/2017   Procedure: LEFT HEART CATH AND CORONARY ANGIOGRAPHY;  Surgeon: Elder Negus, MD;  Location: [facility] INVASIVE CV LAB;  Service: Cardiovascular;  Laterality: N/A;   LEFT HEART CATH AND  CORONARY ANGIOGRAPHY N/A 08/10/2020   Procedure: LEFT HEART CATH AND CORONARY ANGIOGRAPHY;  Surgeon: Elder Negus, MD;  Location: [facility] INVASIVE CV LAB;  Service: Cardiovascular;  Laterality: N/A;   RIGHT/LEFT HEART CATH AND CORONARY ANGIOGRAPHY N/A 06/22/2023   Procedure: RIGHT/LEFT HEART CATH AND CORONARY ANGIOGRAPHY;  Surgeon: Yvonne Kendall, MD;  Location: [facility] INVASIVE CV LAB;  Service: Cardiovascular;  Laterality: N/A;   SKIN GRAFT     TEE WITHOUT CARDIOVERSION N/A 06/25/2023   Procedure: TRANSESOPHAGEAL ECHOCARDIOGRAM;  Surgeon: Loreli Slot, MD;  Location: [facility] OR;  Service: Open Heart Surgery;  Laterality: N/A;   VENTRICULAR ASSIST DEVICE INSERTION N/A 07/13/2017   Procedure: Ventricular Assist  Device Insertion;  Surgeon: Yates Decamp, MD;  Location: [facility] INVASIVE CV LAB;  Service: Cardiovascular;  Laterality: N/A;    Current Meds  Medication Sig   amiodarone (PACERONE) 200 MG tablet Take 2 tablets (400 mg total) by mouth daily.   aspirin 81 MG chewable tablet Chew 1 tablet (81 mg total) by mouth daily.   atorvastatin (LIPITOR) 20 MG tablet Take 1 tablet (20 mg total) by mouth daily.   dapagliflozin propanediol (FARXIGA) 10 MG TABS tablet Take 1 tablet (10 mg total) by mouth daily before breakfast.   eszopiclone (LUNESTA) 1 MG TABS tablet Take 1 tablet (1 mg total) by mouth at bedtime as needed for sleep. Take immediately before bedtime   Evolocumab (REPATHA SURECLICK) 140 MG/ML SOAJ Inject 140mg  into the skin once every 14 days.   ezetimibe (ZETIA) 10 MG tablet Take 1 tablet (10 mg total) by mouth daily.   furosemide (LASIX) 20 MG tablet Take 1 tablet (20 mg) by mouth once every other day   furosemide (LASIX) 40 MG tablet Take 1 tablet (40 mg total) by mouth daily as needed. Take for shortness of breath or abdominal swelling.   levothyroxine (SYNTHROID) 137 MCG tablet Take 1 tablet (137 mcg total) by mouth daily.   metoprolol succinate (TOPROL-XL) 25 MG 24 hr tablet Take 1 tablet (25 mg total) by mouth at bedtime.   mexiletine (MEXITIL) 150 MG capsule Take 1 capsule (150 mg total) by mouth 2 (two) times daily.   pantoprazole (PROTONIX) 40 MG tablet Take 1 tablet (40 mg total) by mouth 2 (two) times daily.   potassium chloride SA (KLOR-CON M20) 20 MEQ tablet Take 1 tablet (20 mEq total) by mouth daily as needed. Take as needed with Lasix.   sertraline (ZOLOFT) 100 MG tablet Take 1 tablet (100 mg total) by mouth daily.   sildenafil (VIAGRA) 100 MG tablet Take 1 tablet (100 mg total) by mouth as needed for erectile dysfunction. TAKE 1 (ONE) TABLET AS NEEDED. DO NOT USE AT SAME TIME AS NITRO   warfarin (COUMADIN) 5 MG tablet Take 1-2 tablets (5-10 mg total) by mouth daily or as prescribed by  Coumadin Clinic    No Known Allergies    Review of Systems negative except from HPI and PMH  Physical Exam BP 108/80 (BP Location: Left Arm, Patient Position: Sitting, Cuff Size: Normal)   Pulse 67   Ht 6\' 1"  (1.854 m)   Wt 226 lb 8 oz (102.7 kg)   SpO2 98%   BMI 29.88 kg/m  Well developed and well nourished in no acute distress HENT normal E scleral and icterus clear Neck Supple JVP flat; carotids brisk and full Clear to ausculation LIfe Vest in place Regular rate and rhythm, no murmurs gallops or rub Soft with active bowel sounds No clubbing cyanosis  Edema Alert and oriented, grossly normal motor and sensory function Skin Warm and Dry  ECG sinus @ 67 20/11/45  Estimated Creatinine Clearance: 78.2 mL/min (by C-G formula based on SCr of 1.36 mg/dL).   Assessment and  Plan   PVCs-frequent right bundle inferior axis QRSd 140 ms   Cardiomyopathy ischemic question arrhythmic contribution   Congestive heart failure class III   Sleep apnea-treated   Hypotension limiting GDMT     Atrial fibrillation-paroxysmal   High Risk Medication Surveillance-amiodarone   Left ventricular apical thrombus  Monitor noted symptoms associated with sinus rhythm on almost all recordings.  He did have some PACs but none of these were noted with symptoms either.  PVCs are quantitatively suppressed.  Will continue the amiodarone and the mexiletine with intentions of reducing amiodarone dosing in the future.  TSH is mildly elevated.  Will increase his Synthroid from 137--150 mcg.  Currently, the strategy for PVC suppression remains antiarrhythmic therapy as he has what was described as a interval clot on his last echocardiogram.  We will plan to reassess the clot as well as LV function with a cMRI at the end of March.  Hopefully there will be interval improvement--is cMRI 11/18 had shown LGE consistent with full-thickness clot in the context of his coronary artery disease.  He is  euvolemic.  Will continue to hold off on diuretics.  I am going to take the liberty of referring him to the heart failure clinic following the MRI.  At that point, hopefully his ejection fraction was improved and the LifeVest can be discontinued.  If the LV clot persists will need to continue him on antiarrhythmic suppression of his PVCs given the symptoms not withstanding improvement or lack thereof. As noted above, we will plan to reduce amiodarone over the next months.  If the clot resolves, would refer him for catheter ablation for his ventricular ectopy  He has started on antidepressant  No bleeding.  Continue on Eliquis  Current medicines are reviewed at length with the patient today .  The patient does not  have concerns regarding medicines.

## 2024-02-14 NOTE — Patient Instructions (Addendum)
Medication Instructions:  Increase Levothyroxine to 150 mcg daily  DECREASE Amiodarone to 200 mg daily   *If you need a refill on your cardiac medications before your next appointment, please call your pharmacy*   Testing/Procedures: Your physician has requested that you have a cardiac MRI. Cardiac MRI uses a computer to create images of your heart as its beating, producing both still and moving pictures of your heart and major blood vessels. For further information please visit [email or web address]. Please follow the instruction sheet given to you today for more information.   Follow-Up: At [facility], you and your health needs are our priority.  As part of our continuing mission to provide you with exceptional heart care, we have created designated Provider Care Teams.  These Care Teams include your primary Cardiologist (physician) and Advanced Practice Providers (APPs -  Physician Assistants and Nurse Practitioners) who all work together to provide you with the care you need, when you need it.  We recommend signing up for the patient portal called "MyChart".  Sign up information is provided on this After Visit Summary.  MyChart is used to connect with patients for Virtual Visits (Telemedicine).  Patients are able to view lab/test results, encounter notes, upcoming appointments, etc.  Non-urgent messages can be sent to your provider as well.   To learn more about what you can do with MyChart, go to [email or web address].    Your next appointment:   3-4 month(s)  Provider:   Sherryl Manges, MD    Other Instructions Referral to heart failure     You are scheduled for Cardiac MRI at the location below.   [facility] [address] [place], [place] [address] Please take advantage of the free valet parking available at the [facility] and [employer] (Entrance C).  Proceed to the [facility] Radiology Department (First Floor) for check-in.   OR   [facility] [address] [place], [place] [address] Please go to the [facility] and check-in with the desk attendant.   Magnetic resonance imaging (MRI) is a painless test that produces images of the inside of the body without using Xrays.  During an MRI, strong magnets and radio waves work together in a Data processing manager to form detailed images.   MRI images may provide more details about a medical condition than X-rays, CT scans, and ultrasounds can provide.  You may be given earphones to listen for instructions.  You may eat a light breakfast and take medications as ordered with the exception of furosemide, hydrochlorothiazide, chlorthalidone or spironolactone (or any other fluid pill). If you are undergoing a stress MRI, please avoid stimulants for 12 hr prior to test. (I.e. Caffeine, nicotine, chocolate, or antihistamine medications)  An IV will be inserted into one of your veins. Contrast material will be injected into your IV. It will leave your body through your urine within a day. You may be told to drink plenty of fluids to help flush the contrast material out of your system.  You will be asked to remove all metal, including: Watch, jewelry, and other metal objects including hearing aids, hair pieces and dentures. Also wearable glucose monitoring systems (ie. Freestyle Libre and Omnipods) (Braces and fillings normally are not a problem.)   TEST WILL TAKE APPROXIMATELY 1 HOUR  PLEASE NOTIFY SCHEDULING AT LEAST 24 HOURS IN ADVANCE IF YOU ARE UNABLE TO KEEP YOUR APPOINTMENT. [phone]  For more information and frequently asked questions, please visit our website :  [email or web address]  Please call the Cardiac Imaging Nurse Navigators with any questions/concerns. [phone] Office

## 2024-02-15 ENCOUNTER — Telehealth: Payer: Self-pay | Admitting: Family

## 2024-02-15 ENCOUNTER — Other Ambulatory Visit: Payer: Self-pay

## 2024-02-15 LAB — CBC
Hematocrit: 40.9 % (ref 37.5–51.0)
Hemoglobin: 12.8 g/dL — ABNORMAL LOW (ref 13.0–17.7)
MCH: 28.1 pg (ref 26.6–33.0)
MCHC: 31.3 g/dL — ABNORMAL LOW (ref 31.5–35.7)
MCV: 90 fL (ref 79–97)
Platelets: 341 10*3/uL (ref 150–450)
RBC: 4.56 x10E6/uL (ref 4.14–5.80)
RDW: 16.9 % — ABNORMAL HIGH (ref 11.6–15.4)
WBC: 7.7 10*3/uL (ref 3.4–10.8)

## 2024-02-15 NOTE — Telephone Encounter (Signed)
Pt confirmed appt for 02/18/24

## 2024-02-16 NOTE — Progress Notes (Unsigned)
 Advanced Heart Failure Clinic Note   Referring Physician: Sherryl Manges, MD PCP: Dana Allan, MD (last seen 02/25) Cardiologist: Julien Nordmann, MD / Carlos Levering, NP (last seen 12/24)  Chief Complaint:   HPI:  Bobby [name] is a 53 y/o male referred by Dr Graciela Husbands. He has a history of CAD w/ stent 07/ 2018, CABG 06/24, frequent PVC's, LV apical clot, depression, hypothyroidism, HTN, OSA, PAF and chronic heart failure. Currently wearing LifeVest and has updated cMRI ordered.   Patient with extensive cardiac history. He was previously followed by Dr. Rosemary Holms. He underwent stenting x 2 to RCA in 2003. In July 2018 he presented with an anterior STEMI and was taken emergently to the the Cath Lab. LHC revealed in-stent CTO of RCA, ostial LAD 100%, diagonal 80%, OM 70% with left-to-right collaterals. He underwent PCI with DES to ostial LAD. Impella was placed secondary to cardiogenic shock. Echo showed EF 30 to 35%. Relook LHC August 2018 showed patent stent. cMRI 11/18 had shown LGE consistent with full-thickness clot in the context of his coronary artery disease.   Echo July 2021 showed EF 40 to 45%. Stress test August 2023 showed abnormal perfusion with fixed defect, EF 41%, intermediate risk.   LHC August 2021 secondary to abnormal stress test was essentially unchanged from previous.   Patient presented to the ED on 06/15/2023 with chest pain and found to have new onset A-fib. He was started on Eliquis and spontaneously converted in the ER. Troponin 97. He was seen in the office by Dr. Mariah Milling for follow-up and continued to complain of chest pain. Outpatient heart catheterization was scheduled. Echo showed EF 25 to 30%. LHC showed significant two-vessel CAD (details above) and patient was transferred to [facility] for CTS evaluation. He underwent CABG x 1 on 06/25/2023.   ED visit 08/28/2023 for shortness of breath.  Troponin 19>> 18.  BNP 455.  CTA negative for PE. Cardiology was consulted and  recommended not starting diuretics given no pulmonary edema or clinical volume overload.  He was discharged.   He presents today for his initial HF clinic visit with a chief complaint of   Review of Systems: [y] = yes, [ ]  = no   General: Weight gain [ ] ; Weight loss [ ] ; Anorexia [ ] ; Fatigue [ ] ; Fever [ ] ; Chills [ ] ; Weakness [ ]   Cardiac: Chest pain/pressure [ ] ; Resting SOB [ ] ; Exertional SOB [ ] ; Orthopnea [ ] ; Pedal Edema [ ] ; Palpitations [ ] ; Syncope [ ] ; Presyncope [ ] ; Paroxysmal nocturnal dyspnea[ ]   Pulmonary: Cough [ ] ; Wheezing[ ] ; Hemoptysis[ ] ; Sputum [ ] ; Snoring [ ]   GI: Vomiting[ ] ; Dysphagia[ ] ; Melena[ ] ; Hematochezia [ ] ; Heartburn[ ] ; Abdominal pain [ ] ; Constipation [ ] ; Diarrhea [ ] ; BRBPR [ ]   GU: Hematuria[ ] ; Dysuria [ ] ; Nocturia[ ]   Vascular: Pain in legs with walking [ ] ; Pain in feet with lying flat [ ] ; Non-healing sores [ ] ; Stroke [ ] ; TIA [ ] ; Slurred speech [ ] ;  Neuro: Headaches[ ] ; Vertigo[ ] ; Seizures[ ] ; Paresthesias[ ] ;Blurred vision [ ] ; Diplopia [ ] ; Vision changes [ ]   Ortho/Skin: Arthritis [ ] ; Joint pain [ ] ; Muscle pain [ ] ; Joint swelling [ ] ; Back Pain [ ] ; Rash [ ]   Psych: Depression[ ] ; Anxiety[ ]   Heme: Bleeding problems [ ] ; Clotting disorders [ ] ; Anemia [ ]   Endocrine: Diabetes [ ] ; Thyroid dysfunction[ ]    Past Medical History:  Diagnosis Date  Acute MI anterior wall first episode care [facility]) 07/13/2017   Occluded ostial LAD S/P 3.5x26 Onyx DES. RI 80%. RCA Occluded at prior stent site with collaterals    Cardiogenic shock (HCC) 07/13/2017   CHF (congestive heart failure) (HCC)    Graves disease    Hypertension    MI (myocardial infarction) (HCC)    Thyroid disease    thyroid irradiated per pt    Current Outpatient Medications  Medication Sig Dispense Refill   amiodarone (PACERONE) 200 MG tablet Take 1 tablet (200 mg total) by mouth daily. 180 tablet 3   aspirin 81 MG chewable tablet Chew 1 tablet (81 mg total) by mouth  daily. 30 tablet 11   atorvastatin (LIPITOR) 20 MG tablet Take 1 tablet (20 mg total) by mouth daily. 90 tablet 3   benzonatate (TESSALON) 100 MG capsule Take 1 capsule (100 mg total) by mouth 3 (three) times daily as needed for cough. (Patient not taking: Reported on 02/14/2024) 30 capsule 0   dapagliflozin propanediol (FARXIGA) 10 MG TABS tablet Take 1 tablet (10 mg total) by mouth daily before breakfast. 90 tablet 3   doxycycline (VIBRA-TABS) 100 MG tablet Take 1 tablet (100 mg total) by mouth 2 (two) times daily. (Patient not taking: Reported on 02/14/2024) 14 tablet 0   eszopiclone (LUNESTA) 1 MG TABS tablet Take 1 tablet (1 mg total) by mouth at bedtime as needed for sleep. Take immediately before bedtime 30 tablet 0   Evolocumab (REPATHA SURECLICK) 140 MG/ML SOAJ Inject 140mg  into the skin once every 14 days. 2 mL 2   ezetimibe (ZETIA) 10 MG tablet Take 1 tablet (10 mg total) by mouth daily. 90 tablet 3   furosemide (LASIX) 20 MG tablet Take 1 tablet (20 mg) by mouth once every other day     furosemide (LASIX) 40 MG tablet Take 1 tablet (40 mg total) by mouth daily as needed. Take for shortness of breath or abdominal swelling. 90 tablet 3   levothyroxine (SYNTHROID) 150 MCG tablet Take 1 tablet (150 mcg total) by mouth daily. 90 tablet 1   metoprolol succinate (TOPROL-XL) 25 MG 24 hr tablet Take 1 tablet (25 mg total) by mouth at bedtime. 90 tablet 2   mexiletine (MEXITIL) 150 MG capsule Take 1 capsule (150 mg total) by mouth 2 (two) times daily. 180 capsule 1   pantoprazole (PROTONIX) 40 MG tablet Take 1 tablet (40 mg total) by mouth 2 (two) times daily. 60 tablet 3   potassium chloride SA (KLOR-CON M20) 20 MEQ tablet Take 1 tablet (20 mEq total) by mouth daily as needed. Take as needed with Lasix. 90 tablet 3   sertraline (ZOLOFT) 100 MG tablet Take 1 tablet (100 mg total) by mouth daily. 90 tablet 3   sildenafil (VIAGRA) 100 MG tablet Take 1 tablet (100 mg total) by mouth as needed for  erectile dysfunction. TAKE 1 (ONE) TABLET AS NEEDED. DO NOT USE AT SAME TIME AS NITRO 30 tablet 3   warfarin (COUMADIN) 5 MG tablet Take 1-2 tablets (5-10 mg total) by mouth daily or as prescribed by [facility] 60 tablet 0   No current facility-administered medications for this visit.    No Known Allergies    Social History   Socioeconomic History   Marital status: Married    Spouse name: Not on file   Number of children: 2   Years of education: Not on file   Highest education level: Associate degree: occupational, Scientist, product/process development, or vocational program  Occupational History   Not on file  Tobacco Use   Smoking status: Former    Current packs/day: 0.00    Average packs/day: 0.5 packs/day for 20.0 years (10.0 ttl pk-yrs)    Types: Cigarettes    Start date: 07/13/1997    Quit date: 07/13/2017    Years since quitting: 6.6   Smokeless tobacco: Never   Tobacco comments:    quit 2012  Vaping Use   Vaping status: Never Used  Substance and Sexual Activity   Alcohol use: Not Currently    Comment: occ   Drug use: Not Currently   Sexual activity: Yes  Other Topics Concern   Not on file  Social History Narrative   Lives with fiance, working FT , disesel equip, right handed, 2 kids, college edu   Social Drivers of Health   Financial Resource Strain: Low Risk  (01/09/2024)   Overall Financial Resource Strain (CARDIA)    Difficulty of Paying Living Expenses: Not hard at all  Food Insecurity: No Food Insecurity (01/09/2024)   Hunger Vital Sign    Worried About Running Out of Food in the Last Year: Never true    Ran Out of Food in the Last Year: Never true  Transportation Needs: No Transportation Needs (01/09/2024)   PRAPARE - Administrator, Civil Service (Medical): No    Lack of Transportation (Non-Medical): No  Physical Activity: Inactive (01/09/2024)   Exercise Vital Sign    Days of Exercise per Week: 0 days    Minutes of Exercise per Session: 60 min  Stress: Stress  Concern Present (01/09/2024)   [employer] of Occupational Health - Occupational Stress Questionnaire    Feeling of Stress : Very much  Social Connections: Moderately Integrated (01/09/2024)   Social Connection and Isolation Panel [NHANES]    Frequency of Communication with Friends and Family: Three times a week    Frequency of Social Gatherings with Friends and Family: Once a week    Attends Religious Services: 1 to 4 times per year    Active Member of [employer] or Organizations: No    Attends Engineer, structural: Not on file    Marital Status: Married  Catering manager Violence: Not At Risk (06/22/2023)   Humiliation, Afraid, Rape, and Kick questionnaire    Fear of Current or Ex-Partner: No    Emotionally Abused: No    Physically Abused: No    Sexually Abused: No      Family History  Problem Relation Age of Onset   Valvular heart disease Mother    Heart disease Father 10       CABG   Hypertension Father    Heart disease Paternal Grandmother     There were no vitals filed for this visit.   PHYSICAL EXAM: General:  Well appearing. No respiratory difficulty HEENT: normal Neck: supple. no JVD. Carotids 2+ bilat; no bruits. No lymphadenopathy or thyromegaly appreciated. Cor: PMI nondisplaced. Regular rate & rhythm. No rubs, gallops or murmurs. Lungs: clear Abdomen: soft, nontender, nondistended. No hepatosplenomegaly. No bruits or masses. Good bowel sounds. Extremities: no cyanosis, clubbing, rash, edema Neuro: alert & oriented x 3, cranial nerves grossly intact. moves all 4 extremities w/o difficulty. Affect pleasant.  ECG:   ASSESSMENT & PLAN:  1: Chronic heart failure with reduced ejection fraction- - suspect  - NYHA class - euvolemic - weighing daily - echo 12/20/23: EF 25-30%, chronic thrombus in apex, RV mildly reduced, moderate LAE - cMRI 11/18 had shown  LGE consistent with full-thickness clot in the context of his coronary artery disease.  - has  upcoming cMRI ordered - continue - BNP 08/28/23 reviewed and was 455.2  2: HTN- - BP - saw PCP Clent Ridges) 02/25 - BMET 01/30/24 reviewed and showed sodium 139, potassium 4.6, creatinine 1.36 and GFR 59.13  3: Atrial fibrillation/ PVC's- - saw EF Graciela Husbands) 02/25 - continue amiodarone - wore zio 02/11/24: min HR of 51 bpm, max HR of 102 bpm, and avg HR of 67 bpm. Predominant underlying rhythm was Sinus Rhythm. Isolated SVEs were occasional (2.3%, 6372), and no SVE Couplets or SVE Triplets were present. Isolated VEs were rare (<1.0%), and no  VE Couplets or VE Triplets were present. Ventricular Trigeminy was present. PVCs significantly diminished   4: CAD- - saw cardiology ([place])  - LDL - CABG 06/24 -  R/ LHC 06/22/23 Significant two-vessel coronary artery disease, including 90% ostial LCx stenosis (ostium appears jailed by proximal LAD stent extending back into the distal LMCA) and chronic total occlusion of proximal RCA stent. Widely patent ostial/proximal LAD stent. Severely reduced left ventricular systolic function (LVEF 25-35%). Upper normal to mildly elevated left and right heart filling pressures (LVEDP 16 mmHg, PCWP 16 mm Hg, mean RA 7). Low normal to mildly reduced cardiac output/index (Fick CO 5.1 L/min, CI 2.3 L/min/m^2). Angiographically normal abdominal aorta, iliac arteries, and right common femoral artery.  5: Hypothyroidism- - TSH  6: OSA- -    Delma Freeze, FNP 02/16/24

## 2024-02-18 ENCOUNTER — Other Ambulatory Visit: Payer: Self-pay

## 2024-02-18 ENCOUNTER — Encounter: Payer: Self-pay | Admitting: Family

## 2024-02-18 ENCOUNTER — Ambulatory Visit: Payer: Commercial Managed Care - PPO | Attending: Family | Admitting: Family

## 2024-02-18 VITALS — BP 113/77 | HR 62 | Ht 73.0 in | Wt 227.8 lb

## 2024-02-18 DIAGNOSIS — F32A Depression, unspecified: Secondary | ICD-10-CM | POA: Diagnosis not present

## 2024-02-18 DIAGNOSIS — Z87891 Personal history of nicotine dependence: Secondary | ICD-10-CM | POA: Diagnosis not present

## 2024-02-18 DIAGNOSIS — I2582 Chronic total occlusion of coronary artery: Secondary | ICD-10-CM | POA: Diagnosis not present

## 2024-02-18 DIAGNOSIS — I5020 Unspecified systolic (congestive) heart failure: Secondary | ICD-10-CM

## 2024-02-18 DIAGNOSIS — I110 Hypertensive heart disease with heart failure: Secondary | ICD-10-CM | POA: Insufficient documentation

## 2024-02-18 DIAGNOSIS — Z86718 Personal history of other venous thrombosis and embolism: Secondary | ICD-10-CM | POA: Insufficient documentation

## 2024-02-18 DIAGNOSIS — Z951 Presence of aortocoronary bypass graft: Secondary | ICD-10-CM | POA: Insufficient documentation

## 2024-02-18 DIAGNOSIS — I493 Ventricular premature depolarization: Secondary | ICD-10-CM | POA: Insufficient documentation

## 2024-02-18 DIAGNOSIS — G4733 Obstructive sleep apnea (adult) (pediatric): Secondary | ICD-10-CM | POA: Diagnosis not present

## 2024-02-18 DIAGNOSIS — Z955 Presence of coronary angioplasty implant and graft: Secondary | ICD-10-CM | POA: Diagnosis not present

## 2024-02-18 DIAGNOSIS — E039 Hypothyroidism, unspecified: Secondary | ICD-10-CM | POA: Diagnosis not present

## 2024-02-18 DIAGNOSIS — R008 Other abnormalities of heart beat: Secondary | ICD-10-CM | POA: Diagnosis not present

## 2024-02-18 DIAGNOSIS — I2510 Atherosclerotic heart disease of native coronary artery without angina pectoris: Secondary | ICD-10-CM | POA: Diagnosis not present

## 2024-02-18 DIAGNOSIS — Z7984 Long term (current) use of oral hypoglycemic drugs: Secondary | ICD-10-CM | POA: Insufficient documentation

## 2024-02-18 DIAGNOSIS — I10 Essential (primary) hypertension: Secondary | ICD-10-CM | POA: Diagnosis not present

## 2024-02-18 DIAGNOSIS — Z7901 Long term (current) use of anticoagulants: Secondary | ICD-10-CM | POA: Insufficient documentation

## 2024-02-18 DIAGNOSIS — R0602 Shortness of breath: Secondary | ICD-10-CM | POA: Diagnosis present

## 2024-02-18 DIAGNOSIS — Z7982 Long term (current) use of aspirin: Secondary | ICD-10-CM | POA: Insufficient documentation

## 2024-02-18 DIAGNOSIS — Z8249 Family history of ischemic heart disease and other diseases of the circulatory system: Secondary | ICD-10-CM | POA: Insufficient documentation

## 2024-02-18 DIAGNOSIS — I252 Old myocardial infarction: Secondary | ICD-10-CM | POA: Diagnosis not present

## 2024-02-18 DIAGNOSIS — I480 Paroxysmal atrial fibrillation: Secondary | ICD-10-CM | POA: Insufficient documentation

## 2024-02-18 DIAGNOSIS — Z79899 Other long term (current) drug therapy: Secondary | ICD-10-CM | POA: Diagnosis not present

## 2024-02-18 DIAGNOSIS — Z79890 Hormone replacement therapy (postmenopausal): Secondary | ICD-10-CM | POA: Insufficient documentation

## 2024-02-18 DIAGNOSIS — I25110 Atherosclerotic heart disease of native coronary artery with unstable angina pectoris: Secondary | ICD-10-CM

## 2024-02-18 MED ORDER — LOSARTAN POTASSIUM 25 MG PO TABS
12.5000 mg | ORAL_TABLET | Freq: Every day | ORAL | 3 refills | Status: DC
  Filled 2024-02-18: qty 45, 90d supply, fill #0

## 2024-02-18 NOTE — Patient Instructions (Signed)
 START LOSARTAN 12.5 MG ONCE DAILY  START CHECKING YOUR BLOOD PRESSURE A FEW TIMES A WEEK AND PLEASE KEEP A LOG OF THIS

## 2024-02-24 DIAGNOSIS — I420 Dilated cardiomyopathy: Secondary | ICD-10-CM | POA: Diagnosis not present

## 2024-02-24 DIAGNOSIS — I252 Old myocardial infarction: Secondary | ICD-10-CM | POA: Diagnosis not present

## 2024-02-26 ENCOUNTER — Encounter: Payer: Self-pay | Admitting: Internal Medicine

## 2024-02-29 ENCOUNTER — Other Ambulatory Visit: Payer: Self-pay

## 2024-02-29 ENCOUNTER — Other Ambulatory Visit: Payer: Self-pay | Admitting: Internal Medicine

## 2024-02-29 MED ORDER — WARFARIN SODIUM 5 MG PO TABS
5.0000 mg | ORAL_TABLET | Freq: Every day | ORAL | 0 refills | Status: DC
  Filled 2024-02-29: qty 60, 30d supply, fill #0

## 2024-03-03 ENCOUNTER — Telehealth: Payer: Self-pay | Admitting: Internal Medicine

## 2024-03-03 ENCOUNTER — Ambulatory Visit: Payer: 59 | Admitting: Cardiovascular Disease

## 2024-03-03 ENCOUNTER — Encounter (HOSPITAL_COMMUNITY): Payer: Self-pay

## 2024-03-03 NOTE — Telephone Encounter (Signed)
 Pt's wife is requesting a callback wanting to make sure that the pre Berkley Harvey has been done for insurance for upcoming MRI on Wednesday before they come in and it hasn't been done. Please advise

## 2024-03-04 ENCOUNTER — Other Ambulatory Visit: Payer: Self-pay

## 2024-03-05 ENCOUNTER — Other Ambulatory Visit: Payer: Self-pay | Admitting: Internal Medicine

## 2024-03-05 ENCOUNTER — Ambulatory Visit
Admission: RE | Admit: 2024-03-05 | Discharge: 2024-03-05 | Disposition: A | Discharge status: Home / Self Care | Payer: Commercial Managed Care - PPO | Source: Ambulatory Visit | Attending: Internal Medicine | Admitting: Internal Medicine

## 2024-03-05 ENCOUNTER — Other Ambulatory Visit: Payer: Self-pay

## 2024-03-05 ENCOUNTER — Ambulatory Visit: Attending: Cardiovascular Disease

## 2024-03-05 ENCOUNTER — Ambulatory Visit: Payer: Commercial Managed Care - PPO

## 2024-03-05 DIAGNOSIS — I480 Paroxysmal atrial fibrillation: Secondary | ICD-10-CM | POA: Diagnosis not present

## 2024-03-05 DIAGNOSIS — I255 Ischemic cardiomyopathy: Secondary | ICD-10-CM | POA: Diagnosis not present

## 2024-03-05 DIAGNOSIS — Z5181 Encounter for therapeutic drug level monitoring: Secondary | ICD-10-CM | POA: Diagnosis not present

## 2024-03-05 DIAGNOSIS — I493 Ventricular premature depolarization: Secondary | ICD-10-CM

## 2024-03-05 DIAGNOSIS — I5020 Unspecified systolic (congestive) heart failure: Secondary | ICD-10-CM

## 2024-03-05 DIAGNOSIS — I5042 Chronic combined systolic (congestive) and diastolic (congestive) heart failure: Secondary | ICD-10-CM

## 2024-03-05 DIAGNOSIS — Z951 Presence of aortocoronary bypass graft: Secondary | ICD-10-CM

## 2024-03-05 DIAGNOSIS — I25110 Atherosclerotic heart disease of native coronary artery with unstable angina pectoris: Secondary | ICD-10-CM

## 2024-03-05 LAB — POCT INR: INR: 2.5 (ref 2.0–3.0)

## 2024-03-05 MED ORDER — GADOBUTROL 1 MMOL/ML IV SOLN
13.0000 mL | Freq: Once | INTRAVENOUS | Status: AC | PRN
Start: 2024-03-05 — End: 2024-03-05
  Administered 2024-03-05: 13 mL via INTRAVENOUS

## 2024-03-05 NOTE — Patient Instructions (Signed)
 Continue taking 2.5 mg  DAILY, EXCEPT 5 mg EVERY MONDAY and FRIDAY.  INR in 6 weeks.  [phone]

## 2024-03-08 NOTE — Telephone Encounter (Signed)
 We can increase back-- prob interprolated PVCs ( a little bit hard to be sure not  /junctional)  lets try 400 bid x 2 weeks then 300 daily  Thanks SK

## 2024-03-10 ENCOUNTER — Other Ambulatory Visit: Payer: Self-pay

## 2024-03-10 MED ORDER — AMIODARONE HCL 200 MG PO TABS
ORAL_TABLET | ORAL | 3 refills | Status: DC
  Filled 2024-03-10 – 2024-03-17 (×3): qty 170, 90d supply, fill #0
  Filled 2024-06-15: qty 135, 90d supply, fill #1
  Filled 2024-09-16: qty 135, 90d supply, fill #2

## 2024-03-17 ENCOUNTER — Other Ambulatory Visit: Payer: Self-pay

## 2024-03-17 ENCOUNTER — Other Ambulatory Visit (HOSPITAL_COMMUNITY): Payer: Self-pay

## 2024-03-21 ENCOUNTER — Other Ambulatory Visit: Payer: Self-pay

## 2024-03-24 ENCOUNTER — Ambulatory Visit: Attending: Cardiology | Admitting: Cardiology

## 2024-03-24 VITALS — BP 108/70 | HR 59 | Ht 73.0 in | Wt 227.2 lb

## 2024-03-24 DIAGNOSIS — I480 Paroxysmal atrial fibrillation: Secondary | ICD-10-CM | POA: Diagnosis not present

## 2024-03-24 NOTE — Patient Instructions (Signed)
 Medication Instructions:  STOP Losartan   Increase Lasix twice daily for 3 days.   *If you need a refill on your cardiac medications before your next appointment, please call your pharmacy*  Lab Work: Your provider would like for you to have following labs drawn today CBC, CMET, TSH, T4, BNP, MAG.   If you have labs (blood work) drawn today and your tests are completely normal, you will receive your results only by: MyChart Message (if you have MyChart) OR A paper copy in the mail If you have any lab test that is abnormal or we need to change your treatment, we will call you to review the results.   Follow-Up: At [facility], you and your health needs are our priority.  As part of our continuing mission to provide you with exceptional heart care, our providers are all part of one team.  This team includes your primary Cardiologist (physician) and Advanced Practice Providers or APPs (Physician Assistants and Nurse Practitioners) who all work together to provide you with the care you need, when you need it.  Your next appointment:   Will call you for follow up   We recommend signing up for the patient portal called "MyChart".  Sign up information is provided on this After Visit Summary.  MyChart is used to connect with patients for Virtual Visits (Telemedicine).  Patients are able to view lab/test results, encounter notes, upcoming appointments, etc.  Non-urgent messages can be sent to your provider as well.   To learn more about what you can do with MyChart, go to [email or web address].

## 2024-03-24 NOTE — Progress Notes (Unsigned)
 Electrophysiology Clinic Note    Date:  03/25/2024  Patient ID:  Bobby [name], Bobby [name] 05-16-1971, MRN [id number] PCP:  Dana Allan, MD  Cardiologist:  Julien Nordmann, MD Electrophysiologist: Sherryl Manges, MD   Discussed the use of AI scribe software for clinical note transcription with the patient, who gave verbal consent to proceed.   Patient Profile    Chief Complaint: HFrEF, discuss ICD  History of Present Illness: Bobby [name] is a 53 y.o. male with PMH notable for ICM, CAD s/p PCI, s/p CABG (2024), HFrEF, PVC, parox AFib, LV thrombus, HTN, OSA on CPAP; seen today for Sherryl Manges, MD for routine electrophysiology followup.   He first saw Dr. Graciela Husbands 11/2023 to discuss PVC and cardiomyopathy. Lifevest initiated at that visit. GDMT has been limited by hypotension His  Dr. Graciela Husbands reduced amiodarone. Patient messaged clinic early 02/2024 with increased palpitations and chest pressure. Amio increased to 400mg  BID x 2 weeks, then 300mg  daily. He also obtained cardiac MRI that showed continued LVEF reduction to 31%   On follow-up today, he feels particularly unwell. He has noticed increase abdominal tightness and bloating, has decreased appetite. His weight is staying around 230lb in the morning, dry weight is about 215. He has taken his PRN 40mg  lasix yesterday and today without much improvement. Weight goes down some throughout the day, but has returned to ~230lb in the AM. He has intermittent dizziness. He has intermittent heart palpitations.  He is currently taking 300mg  amiodarone daily.   He requests to ensure ICD is MRI-compatible as he anticipates needing a L shoulder MRI in the near future to eval a potential torn rotator cuff.    Arrhythmia/Device History Amiodarone Mexiletine     ROS:  Please see the history of present illness. All other systems are reviewed and otherwise negative.    Physical Exam    VS:  BP 108/70 (BP Location: Left Arm, Patient Position: Sitting,  Cuff Size: Normal)   Pulse (!) 59   Ht 6\' 1"  (1.854 m)   Wt 227 lb 3.2 oz (103.1 kg)   SpO2 97%   BMI 29.98 kg/m  BMI: Body mass index is 29.98 kg/m.  Wt Readings from Last 3 Encounters:  03/24/24 227 lb 3.2 oz (103.1 kg)  02/18/24 227 lb 12.8 oz (103.3 kg)  02/14/24 226 lb 8 oz (102.7 kg)     GEN- The patient is ill- appearing, slightly diaphoretic. alert and oriented x 3 today.   Lungs- Clear to ausculation bilaterally, normal work of breathing.  Heart- Regular rate and rhythm, JVD at mid-neck. Abd tight Extremities- Trace peripheral edema, warm, dry    Studies Reviewed   Previous EP, cardiology notes.    EKG is ordered. Personal review of EKG from today shows:    EKG Interpretation Date/Time:  Monday March 24 2024 14:32:18 EDT Ventricular Rate:  59 PR Interval:  178 QRS Duration:  114 QT Interval:  486 QTC Calculation: 481 R Axis:   -40  Text Interpretation: Sinus bradycardia Left axis deviation Incomplete left bundle branch block Minimal voltage criteria for LVH, may be normal variant ( Cornell product ) Nonspecific T wave abnormality Confirmed by Sherie Don [phone]) on 03/24/2024 2:39:14 PM    Cardiac MRI, 03/05/2024 1. Moderate-severely reduced systolic function (LVEF = 31%).  2. Subendocardial LGE in the LV mid to apical anterior and anteroseptal walls, apical walls.   3. Small apical thrombus noted, measures about 10 x 4 mm.  4. Moderately reduced RV function.  5. Findings suggest ischemic cardiomyopathy with prior LAD infarct.  TTE, 12/20/2023  1. Global hypokinesis with Severe apical hypokinesis/akinesis, images with definity suggestive of chronic/calcified and likely new mural thrombus. Calcification/chronic thrombus in apical region noted on prior TEE 7/24.   2. Left ventricular ejection fraction, by estimation, is 25 to 30%. The left ventricle has severely decreased function. The left ventricle demonstrates global hypokinesis. The left ventricular internal  cavity size was moderately dilated. Left  ventricular diastolic parameters are indeterminate.   3. Right ventricular systolic function is mildly reduced. The right ventricular size is normal. Tricuspid regurgitation signal is inadequate for assessing PA pressure.   4. Left atrial size was moderately dilated.   5. The mitral valve is normal in structure. No evidence of mitral valve regurgitation. No evidence of mitral stenosis.   6. The aortic valve is tricuspid. Aortic valve regurgitation is not visualized. No aortic stenosis is present.   7. The inferior vena cava is normal in size with greater than 50% respiratory variability, suggesting right atrial pressure of 3 mmHg.    Long term monitor, 11/19/2023 Patient had a min HR of 51 bpm, max HR of 135 bpm, and avg HR of 76 bpm.  1 run of Ventricular Tachycardia occurred lasting 6 beats with a max rate of 135 bpm (avg 120 bpm).  1 run of Supraventricular Tachycardia occurred lasting 8 beats with a max rate of 126 bpm (avg 115 bpm).  Isolated SVEs were rare (<1.0%), and no SVE Couplets or SVE Triplets were present.  Isolated VEs were frequent (17.6%, 130307), VE Couplets were rare (<1.0%, 1119), and VE Triplets were rare (<1.0%, 2).  Ventricular Bigeminy and Trigeminy were present.    Patient triggered events (29) associated with normal sinus rhythm and PVCs  TTE, 06/2020 Poor visualization of endocardial borders.  Left ventricle cavity is  normal in size and thickness. Anterior/anteroseptal akinesis. LVEF  probably 40-45%.  Indeterminate diastolic filling pattern.  No significant valvular abnormalities.  normal right atrial pressure.  No significant change compared to previous study in 2018.    TTE, 06/22/2023  1. Left ventricular ejection fraction, by estimation, is 25 to 30%. The  left ventricle has severely decreased function. The left ventricle  demonstrates regional wall motion abnormalities (see scoring  diagram/findings for  description). There is mild left   ventricular hypertrophy. Left ventricular diastolic parameters are  consistent with Grade I diastolic dysfunction (impaired relaxation). There  is akinesis of the left ventricular, entire anterior wall and apical  segment.   2. Right ventricular systolic function is mildly reduced. The right  ventricular size is mildly enlarged. Tricuspid regurgitation signal is  inadequate for assessing PA pressure.   3. The mitral valve was not well visualized. No evidence of mitral valve  regurgitation. No evidence of mitral stenosis.   4. The aortic valve was not well visualized. Aortic valve regurgitation  is not visualized. No aortic stenosis is present.    Ambulatory monitor, 03/2020 HR 44-150 bpm. Avg HR 72 bpm. Supraventricular ectopy, PAC/upto 6 beat run. SVE burden <1% Ventricular ectopy, PVC, NSVT upto 5 beats. VE burden <1% No atrial fibrillation/atrial flutter/SVT/high grade AV block, sinus pause >3sec noted. 12 patient activated events do not correlate with arrhythmias     Assessment and Plan     #) HFrEF #) hypotension He presents with symptoms of fluid overload, including abdominal tightness, and trace peripheral edema. Current diuretic therapy is insufficient, suggesting worsening heart failure. - stop losartan - Increase PRN  lasix to 40mg  BID x three days, take K supplement with each lasix dose - CMP, mag, BNP today  - Advise ER visit if symptoms worsen or labs are significantly abnormal  #) Implantable cardioverter-defibrillator (ICD) consideration ICD is recommended given ongoing reduced LVEF. The presence of a left ventricular thrombus and HF exacerbation complicate safe ICD implantation. He is using a life vest temporarily. ICD options include traditional transvenous or subcutaneous, both requiring supine positioning and MRI compatibility. - Consult with Doctor Graciela Husbands about ICD timing and type - Ensure ICD system is MRI compatible -  Continue life vest use until further decisions  #) Left ventricular thrombus The left ventricular thrombus is decreasing in size per imaging. This complicates ICD implantation due to anticoagulation with warfarin.  - I will discuss timing of ICD implant with LV thrombus with Dr. Graciela Husbands   #) parox afib - Continue amiodarone at 300 mg - update thyroid labs  #) PVC Previous Dr. Graciela Husbands notes mention referring for PVC ablation, will discuss further with him.            Current medicines are reviewed at length with the patient today.   The patient has concerns regarding his medicines.  The following changes were made today:   STOP losartan INCREASE lasix as above  Labs/ tests ordered today include:  Orders Placed This Encounter  Procedures   CBC   Comprehensive metabolic panel with GFR   TSH   T4, free   Brain natriuretic peptide   Magnesium   EKG 12-Lead     Disposition: Follow up with Dr. Graciela Husbands or EP APP in 4 weeks   Signed, Sherie Don, NP  03/25/24  4:26 PM  Electrophysiology [facility]

## 2024-03-25 ENCOUNTER — Telehealth: Payer: Self-pay | Admitting: Family

## 2024-03-25 ENCOUNTER — Other Ambulatory Visit: Payer: Self-pay

## 2024-03-25 DIAGNOSIS — E039 Hypothyroidism, unspecified: Secondary | ICD-10-CM

## 2024-03-25 LAB — COMPREHENSIVE METABOLIC PANEL WITH GFR
ALT: 67 IU/L — ABNORMAL HIGH (ref 0–44)
AST: 40 IU/L (ref 0–40)
Albumin: 4 g/dL (ref 3.8–4.9)
Alkaline Phosphatase: 107 IU/L (ref 44–121)
BUN/Creatinine Ratio: 13 (ref 9–20)
BUN: 20 mg/dL (ref 6–24)
Bilirubin Total: 0.5 mg/dL (ref 0.0–1.2)
CO2: 23 mmol/L (ref 20–29)
Calcium: 8.9 mg/dL (ref 8.7–10.2)
Chloride: 106 mmol/L (ref 96–106)
Creatinine, Ser: 1.57 mg/dL — ABNORMAL HIGH (ref 0.76–1.27)
Globulin, Total: 2.1 g/dL (ref 1.5–4.5)
Glucose: 90 mg/dL (ref 70–99)
Potassium: 4.4 mmol/L (ref 3.5–5.2)
Sodium: 142 mmol/L (ref 134–144)
Total Protein: 6.1 g/dL (ref 6.0–8.5)
eGFR: 52 mL/min/{1.73_m2} — ABNORMAL LOW (ref >59)

## 2024-03-25 LAB — CBC
Hematocrit: 37 % — ABNORMAL LOW (ref 37.5–51.0)
Hemoglobin: 11.7 g/dL — ABNORMAL LOW (ref 13.0–17.7)
MCH: 27.9 pg (ref 26.6–33.0)
MCHC: 31.6 g/dL (ref 31.5–35.7)
MCV: 88 fL (ref 79–97)
Platelets: 236 10*3/uL (ref 150–450)
RBC: 4.19 x10E6/uL (ref 4.14–5.80)
RDW: 16.1 % — ABNORMAL HIGH (ref 11.6–15.4)
WBC: 8.4 10*3/uL (ref 3.4–10.8)

## 2024-03-25 LAB — T4, FREE: Free T4: 1.77 ng/dL (ref 0.82–1.77)

## 2024-03-25 LAB — MAGNESIUM: Magnesium: 2.2 mg/dL (ref 1.6–2.3)

## 2024-03-25 LAB — TSH: TSH: 12.2 u[IU]/mL — ABNORMAL HIGH (ref 0.450–4.500)

## 2024-03-25 LAB — BRAIN NATRIURETIC PEPTIDE: BNP: 503.5 pg/mL — ABNORMAL HIGH (ref 0.0–100.0)

## 2024-03-25 MED ORDER — LEVOTHYROXINE SODIUM 175 MCG PO TABS
175.0000 ug | ORAL_TABLET | Freq: Every day | ORAL | 1 refills | Status: DC
  Filled 2024-03-25: qty 60, 60d supply, fill #0
  Filled 2024-05-20: qty 60, 60d supply, fill #1

## 2024-03-25 NOTE — Telephone Encounter (Incomplete)
 Called to confirm/remind patient of their appointment at the [facility] on 03/26/24***.   Appointment:   [x] Confirmed  [] Left mess   [] No answer/No voice mail  [] Phone not in service  Patient reminded to bring all medications and/or complete list.  Confirmed patient has transportation. Gave directions, instructed to utilize valet parking.

## 2024-03-26 ENCOUNTER — Other Ambulatory Visit: Payer: Self-pay

## 2024-03-26 ENCOUNTER — Encounter: Payer: Self-pay | Admitting: Family

## 2024-03-26 ENCOUNTER — Ambulatory Visit: Payer: Commercial Managed Care - PPO | Attending: Family | Admitting: Family

## 2024-03-26 VITALS — BP 103/71 | HR 55 | Wt 226.0 lb

## 2024-03-26 DIAGNOSIS — I25110 Atherosclerotic heart disease of native coronary artery with unstable angina pectoris: Secondary | ICD-10-CM

## 2024-03-26 DIAGNOSIS — Z951 Presence of aortocoronary bypass graft: Secondary | ICD-10-CM | POA: Diagnosis not present

## 2024-03-26 DIAGNOSIS — I110 Hypertensive heart disease with heart failure: Secondary | ICD-10-CM | POA: Diagnosis not present

## 2024-03-26 DIAGNOSIS — I480 Paroxysmal atrial fibrillation: Secondary | ICD-10-CM | POA: Diagnosis not present

## 2024-03-26 DIAGNOSIS — I420 Dilated cardiomyopathy: Secondary | ICD-10-CM | POA: Diagnosis not present

## 2024-03-26 DIAGNOSIS — Z86718 Personal history of other venous thrombosis and embolism: Secondary | ICD-10-CM | POA: Diagnosis not present

## 2024-03-26 DIAGNOSIS — I2510 Atherosclerotic heart disease of native coronary artery without angina pectoris: Secondary | ICD-10-CM | POA: Insufficient documentation

## 2024-03-26 DIAGNOSIS — I252 Old myocardial infarction: Secondary | ICD-10-CM | POA: Diagnosis not present

## 2024-03-26 DIAGNOSIS — Z79890 Hormone replacement therapy (postmenopausal): Secondary | ICD-10-CM | POA: Insufficient documentation

## 2024-03-26 DIAGNOSIS — Z79899 Other long term (current) drug therapy: Secondary | ICD-10-CM | POA: Insufficient documentation

## 2024-03-26 DIAGNOSIS — I2582 Chronic total occlusion of coronary artery: Secondary | ICD-10-CM | POA: Diagnosis not present

## 2024-03-26 DIAGNOSIS — Z87891 Personal history of nicotine dependence: Secondary | ICD-10-CM | POA: Insufficient documentation

## 2024-03-26 DIAGNOSIS — I10 Essential (primary) hypertension: Secondary | ICD-10-CM | POA: Diagnosis not present

## 2024-03-26 DIAGNOSIS — I5022 Chronic systolic (congestive) heart failure: Secondary | ICD-10-CM | POA: Diagnosis not present

## 2024-03-26 DIAGNOSIS — Z7984 Long term (current) use of oral hypoglycemic drugs: Secondary | ICD-10-CM | POA: Diagnosis not present

## 2024-03-26 DIAGNOSIS — G4733 Obstructive sleep apnea (adult) (pediatric): Secondary | ICD-10-CM | POA: Diagnosis not present

## 2024-03-26 DIAGNOSIS — Z7982 Long term (current) use of aspirin: Secondary | ICD-10-CM | POA: Insufficient documentation

## 2024-03-26 DIAGNOSIS — Z955 Presence of coronary angioplasty implant and graft: Secondary | ICD-10-CM | POA: Diagnosis not present

## 2024-03-26 DIAGNOSIS — E039 Hypothyroidism, unspecified: Secondary | ICD-10-CM | POA: Insufficient documentation

## 2024-03-26 DIAGNOSIS — Z7901 Long term (current) use of anticoagulants: Secondary | ICD-10-CM | POA: Diagnosis not present

## 2024-03-26 DIAGNOSIS — I513 Intracardiac thrombosis, not elsewhere classified: Secondary | ICD-10-CM | POA: Diagnosis not present

## 2024-03-26 DIAGNOSIS — R008 Other abnormalities of heart beat: Secondary | ICD-10-CM | POA: Insufficient documentation

## 2024-03-26 DIAGNOSIS — I493 Ventricular premature depolarization: Secondary | ICD-10-CM | POA: Diagnosis not present

## 2024-03-26 MED ORDER — TORSEMIDE 20 MG PO TABS
40.0000 mg | ORAL_TABLET | Freq: Every day | ORAL | 3 refills | Status: DC
  Filled 2024-03-26: qty 180, 90d supply, fill #0
  Filled 2024-07-08: qty 180, 90d supply, fill #1

## 2024-03-26 NOTE — Patient Instructions (Signed)
 STOP Lasix.  START Torsemide 40 mg ( 2 tabs) daily.  Go DOWN to LOWER LEVEL (LL) to have your blood work completed inside of [employer] office.  We will only call you if the results are abnormal or if the provider would like to make medication changes.  Your physician recommends that you schedule a follow-up appointment in: 3 weeks.  At the [facility], you and your health needs are our priority. As part of our continuing mission to provide you with exceptional heart care, we have created designated Provider Care Teams. These Care Teams include your primary Cardiologist (physician) and Advanced Practice Providers (APPs- Physician Assistants and Nurse Practitioners) who all work together to provide you with the care you need, when you need it.   You may see any of the following providers on your designated Care Team at your next follow up: Dr Arvilla Meres Dr Marca Ancona Dr. Dorthula Nettles Dr. Clearnce Hasten Amy Filbert Schilder, NP Robbie Lis, [place] [facility] [place], [place] Brynda Peon, NP [place] Lee, NP Clarisa Kindred, NP Karle Plumber, PharmD Enos Fling, PharmD   Please be sure to bring in all your medications bottles to every appointment.    Thank you for choosing [facility]

## 2024-03-26 NOTE — Progress Notes (Addendum)
 [facility] Note    Referring Physician: Richardo Chandler, MD PCP: Valli Gaw, MD (last seen 02/25) Cardiologist: Belva Boyden, MD / Morey Ar, NP (last seen 12/24)  Chief Complaint: shortness of breath  HPI:  [name] is a 53 y/o male referred by Dr Rodolfo Clan. He has a history of CAD w/ stent 07/ 2018, CABG 06/24, frequent PVC's, LV apical clot, depression, hypothyroidism, HTN, OSA, PAF and chronic heart failure. He has his first MI at the age of 35. Strong history of CAD in his dad and his paternal grandma. No HF in the family that he's aware of. Currently wearing LifeVest and has updated cMRI ordered.   Patient with extensive cardiac history. He was previously followed by Dr. Filiberto Hug. He underwent stenting x 2 to RCA in 2003. In July 2018 he presented with an anterior STEMI and was taken emergently to the the Cath Lab. LHC revealed in-stent CTO of RCA, ostial LAD 100%, diagonal 80%, OM 70% with left-to-right collaterals. He underwent PCI with DES to ostial LAD. Impella was placed secondary to cardiogenic shock. Echo showed EF 30 to 35%. Relook LHC August 2018 showed patent stent. cMRI 11/18 had shown LGE consistent with full-thickness clot in the context of his coronary artery disease.   Echo July 2021 showed EF 40 to 45%. Stress test August 2023 showed abnormal perfusion with fixed defect, EF 41%, intermediate risk.   LHC August 2021 secondary to abnormal stress test was essentially unchanged from previous.   Patient presented to the ED on 06/15/2023 with chest pain and found to have new onset A-fib. He was started on Eliquis  and spontaneously converted in the ER. Troponin 97. He was seen in the office by Dr. Gollan for follow-up and continued to complain of chest pain. Outpatient heart catheterization was scheduled. Echo showed EF 25 to 30%. LHC showed significant two-vessel CAD (details above) and patient was transferred to [facility] for CTS evaluation. He  underwent CABG x 1 on 06/25/2023.   ED visit 08/28/2023 for shortness of breath. Troponin 19>> 18.  BNP 455.  CTA negative for PE. Cardiology was consulted and recommended not starting diuretics given no pulmonary edema or clinical volume overload. He was discharged.   cMRI 03/05/24:  1. Moderate-severely reduced systolic function (LVEF = 31%).  2. Subendocardial LGE in the LV mid to apical anterior and anteroseptal walls, apical walls.  3. Small apical thrombus noted, measures about 10 x 4 mm.  4. Moderately reduced RV function.  5. Findings suggest ischemic cardiomyopathy with prior LAD infarct.  He presents today for a HF follow-up visit with a chief complaint of shortness of breath (improving). Has associated fatigue and abdominal distention (much better) along with this. Denies chest pain, cough, palpitations or pedal edema. Slept ok last night. 2 days ago he went to see EP and was given furosemide  40mg  BID for a few days due to worsening symptoms, weight gain and abdominal distention. He says that he feels much better although still feels like he has some swelling in his abdomen. Was concerned about getting too dry so only took the lasix  once today. Home weight back down to dry weight of 216-217. He was unable to tolerate losartan  as it made his BP too low so it was stopped.   Has worn lifevest since 01/25 and it has alarmed a few times. No shocks have been delievered. Voices frustration about not feeling better though overall. Discussion has been had about ICD but unsure of timeline  due to his LV thrombus.   ROS: All systems negative except what is listed in HPI, PMH and Problem List   Past Medical History:  Diagnosis Date   Acute MI anterior wall first episode care (HCC) 07/13/2017   Occluded ostial LAD S/P 3.5x26 Onyx DES. RI 80%. RCA Occluded at prior stent site with collaterals    Cardiogenic shock (HCC) 07/13/2017   CHF (congestive heart failure) (HCC)    Graves disease    Hypertension     MI (myocardial infarction) (HCC)    Thyroid  disease    thyroid  irradiated per pt    Current Outpatient Medications  Medication Sig Dispense Refill   amiodarone  (PACERONE ) 200 MG tablet Take 2 tablets (400 mg total) by mouth 2 (two) times daily for 14 days, THEN 1.5 tablets (300 mg total) daily. 191 tablet 3   aspirin  81 MG chewable tablet Chew 1 tablet (81 mg total) by mouth daily. 30 tablet 11   atorvastatin  (LIPITOR) 20 MG tablet Take 1 tablet (20 mg total) by mouth daily. 90 tablet 3   dapagliflozin  propanediol (FARXIGA ) 10 MG TABS tablet Take 1 tablet (10 mg total) by mouth daily before breakfast. 90 tablet 3   Evolocumab  (REPATHA  SURECLICK) 140 MG/ML SOAJ Inject 140mg  into the skin once every 14 days. 2 mL 2   ezetimibe  (ZETIA ) 10 MG tablet Take 1 tablet (10 mg total) by mouth daily. 90 tablet 3   furosemide  (LASIX ) 40 MG tablet Take 1 tablet (40 mg total) by mouth daily as needed. Take for shortness of breath or abdominal swelling. (Patient taking differently: Take 40 mg by mouth daily as needed. Take 20-40mg  PRN for shortness of breath or abdominal swelling.) 90 tablet 3   levothyroxine  (SYNTHROID ) 175 MCG tablet Take 1 tablet (175 mcg total) by mouth daily. 60 tablet 1   metoprolol  succinate (TOPROL -XL) 25 MG 24 hr tablet Take 1 tablet (25 mg total) by mouth at bedtime. 90 tablet 2   mexiletine (MEXITIL ) 150 MG capsule Take 1 capsule (150 mg total) by mouth 2 (two) times daily. 180 capsule 1   pantoprazole  (PROTONIX ) 40 MG tablet Take 1 tablet (40 mg total) by mouth 2 (two) times daily. 60 tablet 3   potassium chloride  SA (KLOR-CON  M20) 20 MEQ tablet Take 1 tablet (20 mEq total) by mouth daily as needed. Take as needed with Lasix . 90 tablet 3   sertraline  (ZOLOFT ) 100 MG tablet Take 1 tablet (100 mg total) by mouth daily. 90 tablet 3   sildenafil  (VIAGRA ) 100 MG tablet Take 1 tablet (100 mg total) by mouth as needed for erectile dysfunction. TAKE 1 (ONE) TABLET AS NEEDED. DO NOT USE AT  SAME TIME AS NITRO 30 tablet 3   warfarin (COUMADIN ) 5 MG tablet Take 1-2 tablets (5-10 mg total) by mouth daily or as prescribed by Coumadin  Clinic 60 tablet 0   No current facility-administered medications for this visit.    No Known Allergies    Social History   Socioeconomic History   Marital status: Married    Spouse name: Not on file   Number of children: 2   Years of education: Not on file   Highest education level: Associate degree: occupational, Scientist, product/process development, or vocational program  Occupational History   Not on file  Tobacco Use   Smoking status: Former    Current packs/day: 0.00    Average packs/day: 0.5 packs/day for 20.0 years (10.0 ttl pk-yrs)    Types: Cigarettes    Start date:  07/13/1997    Quit date: 07/13/2017    Years since quitting: 6.7   Smokeless tobacco: Never   Tobacco comments:    quit 2012  Vaping Use   Vaping status: Never Used  Substance and Sexual Activity   Alcohol use: Not Currently    Comment: occ   Drug use: Not Currently   Sexual activity: Yes  Other Topics Concern   Not on file  Social History Narrative   Lives with fiance, working FT , disesel equip, right handed, 2 kids, college edu   Social Drivers of Health   Financial Resource Strain: Low Risk  (01/09/2024)   Overall Financial Resource Strain (CARDIA)    Difficulty of Paying Living Expenses: Not hard at all  Food Insecurity: No Food Insecurity (01/09/2024)   Hunger Vital Sign    Worried About Running Out of Food in the Last Year: Never true    Ran Out of Food in the Last Year: Never true  Transportation Needs: No Transportation Needs (01/09/2024)   PRAPARE - Administrator, Civil Service (Medical): No    Lack of Transportation (Non-Medical): No  Physical Activity: Inactive (01/09/2024)   Exercise Vital Sign    Days of Exercise per Week: 0 days    Minutes of Exercise per Session: 60 min  Stress: Stress Concern Present (01/09/2024)   [employer] of Occupational  Health - Occupational Stress Questionnaire    Feeling of Stress : Very much  Social Connections: Moderately Integrated (01/09/2024)   Social Connection and Isolation Panel [NHANES]    Frequency of Communication with Friends and Family: Three times a week    Frequency of Social Gatherings with Friends and Family: Once a week    Attends Religious Services: 1 to 4 times per year    Active Member of [employer] or Organizations: No    Attends Banker Meetings: Not on file    Marital Status: Married  Catering manager Violence: Not At Risk (06/22/2023)   Humiliation, Afraid, Rape, and Kick questionnaire    Fear of Current or Ex-Partner: No    Emotionally Abused: No    Physically Abused: No    Sexually Abused: No      Family History  Problem Relation Age of Onset   Valvular heart disease Mother    Heart disease Father 71       CABG   Hypertension Father    Heart disease Paternal Grandmother    Vitals:   03/26/24 1507  BP: 103/71  Pulse: (!) 55  SpO2: 96%  Weight: 226 lb (102.5 kg)   Wt Readings from Last 3 Encounters:  03/26/24 226 lb (102.5 kg)  03/24/24 227 lb 3.2 oz (103.1 kg)  02/18/24 227 lb 12.8 oz (103.3 kg)   Lab Results  Component Value Date   CREATININE 1.57 (H) 03/24/2024   CREATININE 1.36 01/30/2024   CREATININE 1.39 (H) 11/30/2023    PHYSICAL EXAM:  General: Well appearing. No resp difficulty HEENT: normal Neck: supple, no JVD Cor: Regular rhythm, bradycardic. No rubs, gallops or murmurs Lungs: clear Abdomen: soft, nontender, distended. Extremities: no cyanosis, clubbing, rash, edema Neuro: alert & oriented X 3. Moves all 4 extremities w/o difficulty. Affect pleasant   ECG: not done   ASSESSMENT & PLAN:  1: Ischemic heart failure with reduced ejection fraction- - 1st MI at age 28, CABG 06/24 - has been wearing LifeVest since ~ 01/25; no shocks delivered - NYHA class II - euvolemic - weighing daily -  weight down 1.8 pounds from last visit  here 6 weeks ago - echo 12/20/23: EF 25-30%, chronic thrombus in apex, RV mildly reduced, moderate LAE - cMRI 11/18 had shown LGE consistent with full-thickness clot in the context of his coronary artery disease.  - cMRI 03/05/24:  1. Moderate-severely reduced systolic function (LVEF = 31%).  2. Subendocardial LGE in the LV mid to apical anterior and anteroseptal walls, apical walls.  3. Small apical thrombus noted, measures about 10 x 4 mm.  4. Moderately reduced RV function.  5. Findings suggest ischemic cardiomyopathy with prior LAD infarct. - continue farxiga  10mg  daily - stop furosemide  and begin torsemide  40mg  daily/  potassium 20meq  - continue metoprolol  succinate 25mg  daily - continue warfarin for LV thrombus per coumadin  clinic - current BP will not tolerate MRA - BMET today & repeat at next visit - discussion has been had about ICD implant but timeline unclear due to LV thrombus - not adding salt to his food and is reading food labels for sodium content - BNP 08/28/23 reviewed and was 455.2  2: HTN- - BP 103/71 - saw PCP Sueanne Emerald) 02/25 - BMET 03/24/24 reviewed and showed sodium 142, potassium 4.4, creatinine 1.57 and GFR 52 - BMET today  3: Atrial fibrillation/ PVC's- - saw EP ([place]) 03/25 - continue amiodarone  300mg  daily - continue mexiletine 150mg  BID - wore zio 02/11/24: min HR of 51 bpm, max HR of 102 bpm, and avg HR of 67 bpm. Predominant underlying rhythm was Sinus Rhythm. Isolated SVEs were occasional (2.3%, 6372), and no SVE Couplets or SVE Triplets were present. Isolated VEs were rare (<1.0%), and no VE Couplets or VE Triplets were present. Ventricular Trigeminy was present. PVCs significantly diminished   4: CAD- - saw cardiology ([place])  - continue ASA 81mg  daily - continue atorvastatin  20mg  daily - continue repatha  140mg  every 2 weeks - continue ezetimibe  10mg  daily - LDL 06/13/23 reviewed and was 19 - lipo (a) 08/05/23 reviewed and was <8.4 - CABG  06/24 - R/ LHC 06/22/23 Significant two-vessel coronary artery disease, including 90% ostial LCx stenosis (ostium appears jailed by proximal LAD stent extending back into the distal LMCA) and chronic total occlusion of proximal RCA stent. Widely patent ostial/proximal LAD stent. Severely reduced left ventricular systolic function (LVEF 25-35%). Upper normal to mildly elevated left and right heart filling pressures (LVEDP 16 mmHg, PCWP 16 mm Hg, mean RA 7). Low normal to mildly reduced cardiac output/index (Fick CO 5.1 L/min, CI 2.3 L/min/m^2). Angiographically normal abdominal aorta, iliac arteries, and right common femoral artery.  5: Hypothyroidism- - TSH 01/30/24 reviewed and was 7.92 - continue synthroid  175mg  mcg   6: OSA- - wearing CPAP nasal pillows for last 2-3 years   Return in 3 weeks to see HF MD, sooner if needed.   Charlette Console, FNP 03/26/24

## 2024-03-27 ENCOUNTER — Ambulatory Visit: Attending: Cardiology

## 2024-03-27 ENCOUNTER — Other Ambulatory Visit: Payer: Self-pay

## 2024-03-27 ENCOUNTER — Encounter: Payer: Self-pay | Admitting: Family

## 2024-03-27 ENCOUNTER — Telehealth: Payer: Self-pay | Admitting: Cardiology

## 2024-03-27 DIAGNOSIS — I480 Paroxysmal atrial fibrillation: Secondary | ICD-10-CM | POA: Diagnosis not present

## 2024-03-27 LAB — BASIC METABOLIC PANEL WITH GFR
BUN/Creatinine Ratio: 15 (ref 9–20)
BUN: 23 mg/dL (ref 6–24)
CO2: 24 mmol/L (ref 20–29)
Calcium: 9.5 mg/dL (ref 8.7–10.2)
Chloride: 100 mmol/L (ref 96–106)
Creatinine, Ser: 1.57 mg/dL — ABNORMAL HIGH (ref 0.76–1.27)
Glucose: 89 mg/dL (ref 70–99)
Potassium: 4.8 mmol/L (ref 3.5–5.2)
Sodium: 140 mmol/L (ref 134–144)
eGFR: 52 mL/min/{1.73_m2} — ABNORMAL LOW (ref >59)

## 2024-03-27 NOTE — Telephone Encounter (Signed)
 I called patient to follow-up on our visit earlier this week after speaking with Dr. Graciela Husbands.  Recommended updated 3d zio monitor to confirm PVC suppression.  Also in discussion with HF MD's regarding appropriateness of RHC.     Patient verbalized understanding.

## 2024-03-27 NOTE — Telephone Encounter (Signed)
 ZIO ordered for 3 days.

## 2024-04-01 ENCOUNTER — Other Ambulatory Visit: Payer: Self-pay

## 2024-04-01 ENCOUNTER — Other Ambulatory Visit: Payer: Self-pay | Admitting: Internal Medicine

## 2024-04-01 DIAGNOSIS — I480 Paroxysmal atrial fibrillation: Secondary | ICD-10-CM

## 2024-04-01 MED ORDER — WARFARIN SODIUM 5 MG PO TABS
2.5000 mg | ORAL_TABLET | Freq: Every day | ORAL | 0 refills | Status: DC
  Filled 2024-04-01: qty 60, 60d supply, fill #0

## 2024-04-01 NOTE — Telephone Encounter (Signed)
 Warfarin 5mg  refill Afib, LV clot Last INR 03/05/24 Last OV 03/26/24

## 2024-04-10 ENCOUNTER — Other Ambulatory Visit: Payer: Self-pay

## 2024-04-10 ENCOUNTER — Other Ambulatory Visit: Payer: Self-pay | Admitting: Cardiovascular Disease

## 2024-04-10 DIAGNOSIS — I700 Atherosclerosis of aorta: Secondary | ICD-10-CM

## 2024-04-10 DIAGNOSIS — I25110 Atherosclerotic heart disease of native coronary artery with unstable angina pectoris: Secondary | ICD-10-CM

## 2024-04-10 MED ORDER — REPATHA SURECLICK 140 MG/ML ~~LOC~~ SOAJ
140.0000 mg | SUBCUTANEOUS | 3 refills | Status: AC
  Filled 2024-04-10 – 2024-04-11 (×2): qty 6, 84d supply, fill #0
  Filled 2024-07-08: qty 6, 84d supply, fill #1
  Filled 2024-09-29: qty 6, 84d supply, fill #2
  Filled 2024-12-21: qty 6, 84d supply, fill #3

## 2024-04-11 ENCOUNTER — Other Ambulatory Visit: Payer: Self-pay

## 2024-04-14 ENCOUNTER — Telehealth: Payer: Self-pay | Admitting: Internal Medicine

## 2024-04-14 DIAGNOSIS — I480 Paroxysmal atrial fibrillation: Secondary | ICD-10-CM | POA: Diagnosis not present

## 2024-04-14 MED FILL — Metoprolol Succinate Tab ER 24HR 25 MG (Tartrate Equiv): ORAL | 90 days supply | Qty: 90 | Fill #1 | Status: AC

## 2024-04-14 NOTE — Telephone Encounter (Signed)
 Called to confirm/remind patient of their appointment at the [facility] on 04/15/24.   Appointment:   [x] Confirmed  [] Left mess   [] No answer/No voice mail  [] VM Full/unable to leave message  [] Phone not in service  Patient reminded to bring all medications and/or complete list.  Confirmed patient has transportation. Gave directions, instructed to utilize valet parking.

## 2024-04-15 ENCOUNTER — Other Ambulatory Visit: Payer: Self-pay

## 2024-04-15 ENCOUNTER — Other Ambulatory Visit
Admission: RE | Admit: 2024-04-15 | Discharge: 2024-04-15 | Disposition: A | Discharge status: Home / Self Care | Source: Ambulatory Visit | Attending: Internal Medicine | Admitting: Internal Medicine

## 2024-04-15 ENCOUNTER — Ambulatory Visit (HOSPITAL_BASED_OUTPATIENT_CLINIC_OR_DEPARTMENT_OTHER): Admitting: Internal Medicine

## 2024-04-15 VITALS — BP 100/74 | HR 60 | Wt 220.8 lb

## 2024-04-15 DIAGNOSIS — I480 Paroxysmal atrial fibrillation: Secondary | ICD-10-CM

## 2024-04-15 DIAGNOSIS — E039 Hypothyroidism, unspecified: Secondary | ICD-10-CM | POA: Insufficient documentation

## 2024-04-15 DIAGNOSIS — I5022 Chronic systolic (congestive) heart failure: Secondary | ICD-10-CM

## 2024-04-15 DIAGNOSIS — G4733 Obstructive sleep apnea (adult) (pediatric): Secondary | ICD-10-CM | POA: Diagnosis not present

## 2024-04-15 DIAGNOSIS — I493 Ventricular premature depolarization: Secondary | ICD-10-CM | POA: Diagnosis not present

## 2024-04-15 DIAGNOSIS — I509 Heart failure, unspecified: Secondary | ICD-10-CM | POA: Diagnosis not present

## 2024-04-15 LAB — BASIC METABOLIC PANEL WITH GFR
Anion gap: 11 (ref 5–15)
BUN: 33 mg/dL — ABNORMAL HIGH (ref 6–20)
CO2: 30 mmol/L (ref 22–32)
Calcium: 8.9 mg/dL (ref 8.9–10.3)
Chloride: 100 mmol/L (ref 98–111)
Creatinine, Ser: 1.8 mg/dL — ABNORMAL HIGH (ref 0.61–1.24)
GFR, Estimated: 44 mL/min — ABNORMAL LOW (ref >60)
Glucose, Bld: 113 mg/dL — ABNORMAL HIGH (ref 70–99)
Potassium: 3.6 mmol/L (ref 3.5–5.1)
Sodium: 141 mmol/L (ref 135–145)

## 2024-04-15 LAB — BRAIN NATRIURETIC PEPTIDE: B Natriuretic Peptide: 331.1 pg/mL — ABNORMAL HIGH (ref 0.0–100.0)

## 2024-04-15 LAB — T4, FREE: Free T4: 1.2 ng/dL — ABNORMAL HIGH (ref 0.61–1.12)

## 2024-04-15 LAB — TSH: TSH: 2.722 u[IU]/mL (ref 0.350–4.500)

## 2024-04-15 MED ORDER — DIGOXIN 125 MCG PO TABS
0.1250 mg | ORAL_TABLET | Freq: Every day | ORAL | 5 refills | Status: DC
  Filled 2024-04-15: qty 90, 90d supply, fill #0
  Filled 2024-07-14: qty 90, 90d supply, fill #1

## 2024-04-15 NOTE — Patient Instructions (Signed)
 Medication Changes:  START DIGOXIN  0.125 MG ONCE DAILY  Lab Work:  Go over to the [facility]. Go pass the gift shop and have your blood work completed.  We will only call you if the results are abnormal or if the provider would like to make medication changes.   Testing/Procedures:  We will call you to schedule your CPX and go over the information, once they're available in July 2025.   Follow-Up in: 1 month with Dr. Bensimhon.  At the [facility], you and your health needs are our priority. We have a designated team specialized in the treatment of Heart Failure. This Care Team includes your primary Heart Failure Specialized Cardiologist (physician), Advanced Practice Providers (APPs- Physician Assistants and Nurse Practitioners), and Pharmacist who all work together to provide you with the care you need, when you need it.   You may see any of the following providers on your designated Care Team at your next follow up:  Dr. Jules Oar Dr. Peder Bourdon Dr. Alwin Baars Dr. Judyth Nunnery Shawnee Dellen, FNP Bevely Brush, RPH-CPP  Please be sure to bring in all your medications bottles to every appointment.   Need to Contact Us :  If you have any questions or concerns before your next appointment please send us  a message through [place] or call our office at [phone].    TO LEAVE A MESSAGE FOR THE NURSE SELECT OPTION 2, PLEASE LEAVE A MESSAGE INCLUDING: YOUR NAME DATE OF BIRTH CALL BACK NUMBER REASON FOR CALL**this is important as we prioritize the call backs  YOU WILL RECEIVE A CALL BACK THE SAME DAY AS LONG AS YOU CALL BEFORE 4:00 PM

## 2024-04-15 NOTE — Progress Notes (Signed)
 [facility] Consult  Note    Referring Physician: Richardo Chandler, MD PCP: Valli Gaw, MD (last seen 02/25) Cardiologist: Belva Boyden, MD / Morey Ar, NP (last seen 12/24)  Chief Complaint: f/u for chronic systolic heart failure   HPI:  Bobby [name] is a 53 y/o male referred by Dr Rodolfo Clan for further evaluation of his HF.    He has a history of CAD w/ stent 07/ 2018, CABG 06/24, frequent PVC's, LV apical clot, depression, hypothyroidism, HTN, OSA, PAF and chronic heart failure. He has his first MI at the age of 58. Strong history of CAD in his dad and his paternal grandma. No HF in the family that he's aware of. Currently wearing LifeVest and has updated cMRI ordered.   Patient with extensive cardiac history. He was previously followed by Dr. Filiberto Hug. He underwent stenting x 2 to RCA in 2003. In July 2018 he presented with an anterior STEMI and was taken emergently to the the Cath Lab. LHC revealed in-stent CTO of RCA, ostial LAD 100%, diagonal 80%, OM 70% with left-to-right collaterals. He underwent PCI with DES to ostial LAD. Impella was placed secondary to cardiogenic shock. Echo showed EF 30 to 35%. Relook LHC August 2018 showed patent stent. cMRI 11/18 had shown LGE consistent with full-thickness clot in the context of his coronary artery disease.   Echo July 2021 showed EF 40 to 45%. Stress test August 2023 showed abnormal perfusion with fixed defect, EF 41%, intermediate risk.   LHC August 2021 secondary to abnormal stress test was essentially unchanged from previous.   Patient presented to the ED on 06/15/2023 with chest pain and found to have new onset A-fib. He was started on Eliquis  and spontaneously converted in the ER. Troponin 97. He was seen in the office by Dr. Gollan for follow-up and continued to complain of chest pain. Outpatient heart catheterization was scheduled. Echo showed EF 25 to 30%. LHC showed significant two-vessel CAD (details above)  and patient was transferred to [facility] for CTS evaluation. He underwent CABG x 1 on 06/25/2023.   ED visit 08/28/2023 for shortness of breath. Troponin 19>> 18.  BNP 455.  CTA negative for PE. Cardiology was consulted and recommended not starting diuretics given no pulmonary edema or clinical volume overload. He was discharged.   cMRI 03/05/24:  1. Moderate-severely reduced systolic function (LVEF = 31%).  2. Subendocardial LGE in the LV mid to apical anterior and anteroseptal walls, apical walls.  3. Small apical thrombus noted, measures about 10 x 4 mm.  4. Moderately reduced RV function.  5. Findings suggest ischemic cardiomyopathy with prior LAD infarct.  He was unable to tolerate losartan  as it made his BP too low so it was stopped.   Continues to wear life vest. Discussion has been had about ICD but unsure of timeline due to his LV thrombus. LifeVest interrogation 6,119 steps. Multiple alarms but no therapies.   Saw Elberta of 03/26/24 and lasix  was switched to torsemide  40. Says he feels much better. Continues to work [employer]. No CP. Does have issues with SOB and fatigue. But has more bad days than good days/ No edema, orthopnea or PND. Takes BP 3x/day typically 100/60 Wears CPAP every night  ROS: All systems negative except what is listed in HPI, PMH and Problem List   Past Medical History:  Diagnosis Date   Acute MI anterior wall first episode care (HCC) 07/13/2017   Occluded ostial LAD S/P 3.5x26 Onyx DES. RI  80%. RCA Occluded at prior stent site with collaterals    Cardiogenic shock (HCC) 07/13/2017   CHF (congestive heart failure) (HCC)    Graves disease    Hypertension    MI (myocardial infarction) (HCC)    Thyroid  disease    thyroid  irradiated per pt    Current Outpatient Medications  Medication Sig Dispense Refill   amiodarone  (PACERONE ) 200 MG tablet Take 2 tablets (400 mg total) by mouth 2 (two) times daily for 14 days, THEN 1.5 tablets (300 mg total) daily. 191 tablet 3    aspirin  81 MG chewable tablet Chew 1 tablet (81 mg total) by mouth daily. 30 tablet 11   atorvastatin  (LIPITOR) 20 MG tablet Take 1 tablet (20 mg total) by mouth daily. 90 tablet 3   dapagliflozin  propanediol (FARXIGA ) 10 MG TABS tablet Take 1 tablet (10 mg total) by mouth daily before breakfast. 90 tablet 3   Evolocumab  (REPATHA  SURECLICK) 140 MG/ML SOAJ Inject 140mg  into the skin once every 14 days. 6 mL 3   ezetimibe  (ZETIA ) 10 MG tablet Take 1 tablet (10 mg total) by mouth daily. 90 tablet 3   levothyroxine  (SYNTHROID ) 175 MCG tablet Take 1 tablet (175 mcg total) by mouth daily. 60 tablet 1   metoprolol  succinate (TOPROL -XL) 25 MG 24 hr tablet Take 1 tablet (25 mg total) by mouth at bedtime. 90 tablet 2   mexiletine (MEXITIL ) 150 MG capsule Take 1 capsule (150 mg total) by mouth 2 (two) times daily. 180 capsule 1   pantoprazole  (PROTONIX ) 40 MG tablet Take 1 tablet (40 mg total) by mouth 2 (two) times daily. 60 tablet 3   sertraline  (ZOLOFT ) 100 MG tablet Take 1 tablet (100 mg total) by mouth daily. 90 tablet 3   sildenafil  (VIAGRA ) 100 MG tablet Take 1 tablet (100 mg total) by mouth as needed for erectile dysfunction. TAKE 1 (ONE) TABLET AS NEEDED. DO NOT USE AT SAME TIME AS NITRO 30 tablet 3   torsemide  (DEMADEX ) 20 MG tablet Take 2 tablets (40 mg total) by mouth daily. 180 tablet 3   warfarin (COUMADIN ) 5 MG tablet Take 0.5-1 tablets (2.5-5 mg total) by mouth daily as directed by Anticoagulation Clinic. 60 tablet 0   potassium chloride  SA (KLOR-CON  M20) 20 MEQ tablet Take 1 tablet (20 mEq total) by mouth daily as needed. Take as needed with Lasix . 90 tablet 3   No current facility-administered medications for this visit.    No Known Allergies    Social History   Socioeconomic History   Marital status: Married    Spouse name: Not on file   Number of children: 2   Years of education: Not on file   Highest education level: Associate degree: occupational, Scientist, product/process development, or vocational  program  Occupational History   Not on file  Tobacco Use   Smoking status: Former    Current packs/day: 0.00    Average packs/day: 0.5 packs/day for 20.0 years (10.0 ttl pk-yrs)    Types: Cigarettes    Start date: 07/13/1997    Quit date: 07/13/2017    Years since quitting: 6.7   Smokeless tobacco: Never   Tobacco comments:    quit 2012  Vaping Use   Vaping status: Never Used  Substance and Sexual Activity   Alcohol use: Not Currently    Comment: occ   Drug use: Not Currently   Sexual activity: Yes  Other Topics Concern   Not on file  Social History Narrative   Lives with fiance,  working [employer] , Engineer, drilling, right handed, 2 kids, college edu   Social Drivers of Corporate investment banker Strain: Low Risk  (01/09/2024)   Overall Financial Resource Strain (CARDIA)    Difficulty of Paying Living Expenses: Not hard at all  Food Insecurity: No Food Insecurity (01/09/2024)   Hunger Vital Sign    Worried About Running Out of Food in the Last Year: Never true    Ran Out of Food in the Last Year: Never true  Transportation Needs: No Transportation Needs (01/09/2024)   PRAPARE - Administrator, Civil Service (Medical): No    Lack of Transportation (Non-Medical): No  Physical Activity: Inactive (01/09/2024)   Exercise Vital Sign    Days of Exercise per Week: 0 days    Minutes of Exercise per Session: 60 min  Stress: Stress Concern Present (01/09/2024)   [employer] of Occupational Health - Occupational Stress Questionnaire    Feeling of Stress : Very much  Social Connections: Moderately Integrated (01/09/2024)   Social Connection and Isolation Panel [NHANES]    Frequency of Communication with Friends and Family: Three times a week    Frequency of Social Gatherings with Friends and Family: Once a week    Attends Religious Services: 1 to 4 times per year    Active Member of [employer] or Organizations: No    Attends Engineer, structural: Not on file    Marital  Status: Married  Catering manager Violence: Not At Risk (06/22/2023)   Humiliation, Afraid, Rape, and Kick questionnaire    Fear of Current or Ex-Partner: No    Emotionally Abused: No    Physically Abused: No    Sexually Abused: No      Family History  Problem Relation Age of Onset   Valvular heart disease Mother    Heart disease Father 29       CABG   Hypertension Father    Heart disease Paternal Grandmother    Vitals:   04/15/24 1459  BP: 100/74  Pulse: 60  SpO2: 98%  Weight: 220 lb 12.8 oz (100.2 kg)    Wt Readings from Last 3 Encounters:  04/15/24 220 lb 12.8 oz (100.2 kg)  03/26/24 226 lb (102.5 kg)  03/24/24 227 lb 3.2 oz (103.1 kg)   Lab Results  Component Value Date   CREATININE 1.57 (H) 03/26/2024   CREATININE 1.57 (H) 03/24/2024   CREATININE 1.36 01/30/2024   PHYSICAL EXAM: General:  Well appearing. No resp difficulty HEENT: normal Neck: supple. no JVD. Carotids 2+ bilat; no bruits. No lymphadenopathy or thryomegaly appreciated. Cor: PMI nondisplaced. Regular rate & rhythm. No rubs, gallops or murmurs. Lungs: clear Abdomen: soft, nontender, nondistended. No hepatosplenomegaly. No bruits or masses. Good bowel sounds. Extremities: no cyanosis, clubbing, rash, edema Neuro: alert & orientedx3, cranial nerves grossly intact. moves all 4 extremities w/o difficulty. Affect pleasant  ECG: NSR 62 with PACs Personally reviewed    ASSESSMENT & PLAN:  1:Chronic systolic HF due to iCM - EF chronically low since 2018 (30-35%), multiple PCIs, eventual CABG   - RHC 6/24 (pre CABG) RA 7, PA 28/15, PCW 16, PA sat 71%, FICK CO/CI 5.1/2.3  - Echo 12/20/23: EF 25-30%, chronic thrombus in apex, RV mildly reduced, moderate LAE - cMRI 03/05/24: LVEF 31%, RVEF 36%, Subendocardial LGE in the LV mid to apical anterior and anteroseptal walls, apical walls c/w ICM, + small apical thrombus  - NYHA Class III - Volume ok - continue farxiga  10mg   daily - continue torsemide  40mg   daily/  potassium 20meq  - continue metoprolol  succinate 25mg  daily - ARB stopped due to hypotension  - add digoxin  0.125 - will check labs if K ok.  - continue warfarin for LV thrombus per coumadin  clinic - wearing LifeVest, EP following for ICD> Hopefully can get sooner rather than later - I worry he may be progressing to advanced HF. Will need CPX to further evaluate. Will arrange for testing when available in July. If symptoms worsen prior to that will need RHC - labs today  2: Atrial fibrillation/ PVC's- - saw EF ([facility]) 03/25 - controlled on amiodarone  300mg  daily and mexiletine 150mg  BID - wore zio 02/11/24: min HR of 51 bpm, max HR of 102 bpm, and avg HR of 67 bpm. Predominant underlying rhythm was Sinus Rhythm. Isolated SVEs were occasional (2.3%, 6372), and no SVE Couplets or SVE Triplets were present. Isolated VEs were rare (<1.0%), and no VE Couplets or VE Triplets were present. Ventricular Trigeminy was present. PVCs significantly diminished  - Will get amio labs today as he says he ahs been having problems with his thyroid   3: CAD- - 1st MI at age 17 treated w/ PCI, CABG x 1 06/24 (LIMA-OM1)  - continue ASA 81mg  daily - continue atorvastatin  20mg  daily - continue repatha  140mg  every 2 weeks - continue ezetimibe  10mg  daily - no current s/s angina - manage dby Gen Cards  4: Hypothyroidism- - likely due to amio - continue synthroid   - repeat labs today  5: OSA- - wearing CPAP nasal pillows regularly    Jules Oar, MD 04/15/24

## 2024-04-16 ENCOUNTER — Ambulatory Visit: Attending: Cardiovascular Disease

## 2024-04-16 DIAGNOSIS — I255 Ischemic cardiomyopathy: Secondary | ICD-10-CM | POA: Diagnosis not present

## 2024-04-16 DIAGNOSIS — I5042 Chronic combined systolic (congestive) and diastolic (congestive) heart failure: Secondary | ICD-10-CM

## 2024-04-16 DIAGNOSIS — Z5181 Encounter for therapeutic drug level monitoring: Secondary | ICD-10-CM

## 2024-04-16 DIAGNOSIS — Z951 Presence of aortocoronary bypass graft: Secondary | ICD-10-CM

## 2024-04-16 DIAGNOSIS — I493 Ventricular premature depolarization: Secondary | ICD-10-CM

## 2024-04-16 DIAGNOSIS — I480 Paroxysmal atrial fibrillation: Secondary | ICD-10-CM | POA: Diagnosis not present

## 2024-04-16 DIAGNOSIS — I5020 Unspecified systolic (congestive) heart failure: Secondary | ICD-10-CM

## 2024-04-16 DIAGNOSIS — I25110 Atherosclerotic heart disease of native coronary artery with unstable angina pectoris: Secondary | ICD-10-CM

## 2024-04-16 LAB — T3, FREE: T3, Free: 2.1 pg/mL (ref 2.0–4.4)

## 2024-04-16 LAB — POCT INR: INR: 2.4 (ref 2.0–3.0)

## 2024-04-16 NOTE — Patient Instructions (Signed)
 Continue taking 2.5 mg  DAILY, EXCEPT 5 mg EVERY MONDAY and FRIDAY.  INR in 6 weeks.  [phone]

## 2024-04-22 ENCOUNTER — Other Ambulatory Visit: Payer: Self-pay

## 2024-04-25 DIAGNOSIS — I420 Dilated cardiomyopathy: Secondary | ICD-10-CM | POA: Diagnosis not present

## 2024-04-25 DIAGNOSIS — I252 Old myocardial infarction: Secondary | ICD-10-CM | POA: Diagnosis not present

## 2024-04-29 ENCOUNTER — Encounter: Payer: Self-pay | Admitting: Pharmacist

## 2024-04-29 ENCOUNTER — Ambulatory Visit: Admitting: Family Medicine

## 2024-04-29 ENCOUNTER — Encounter: Payer: Self-pay | Admitting: Family Medicine

## 2024-04-29 VITALS — BP 97/53 | HR 54 | Temp 98.2°F | Resp 20 | Ht 73.0 in | Wt 225.1 lb

## 2024-04-29 DIAGNOSIS — I255 Ischemic cardiomyopathy: Secondary | ICD-10-CM

## 2024-04-29 DIAGNOSIS — E063 Autoimmune thyroiditis: Secondary | ICD-10-CM | POA: Diagnosis not present

## 2024-04-29 DIAGNOSIS — R944 Abnormal results of kidney function studies: Secondary | ICD-10-CM | POA: Diagnosis not present

## 2024-04-29 DIAGNOSIS — D6869 Other thrombophilia: Secondary | ICD-10-CM

## 2024-04-29 DIAGNOSIS — F39 Unspecified mood [affective] disorder: Secondary | ICD-10-CM

## 2024-04-29 DIAGNOSIS — I5042 Chronic combined systolic (congestive) and diastolic (congestive) heart failure: Secondary | ICD-10-CM

## 2024-04-29 DIAGNOSIS — I200 Unstable angina: Secondary | ICD-10-CM

## 2024-04-29 DIAGNOSIS — I4891 Unspecified atrial fibrillation: Secondary | ICD-10-CM

## 2024-04-29 DIAGNOSIS — I480 Paroxysmal atrial fibrillation: Secondary | ICD-10-CM

## 2024-04-29 NOTE — Patient Instructions (Addendum)
 It was a pleasure meeting you today. Thank you for allowing me to take part in your health care.  Our goals for today as we discussed include:  We will get some labs today.  If they are abnormal or we need to do something about them, I will call you.  If they are normal, I will send you a message on MyChart (if it is active) or a letter in the mail.  If you don't hear from us  in 2 weeks, please call the office at the number below.   If kidney functions decreased recommend refer to kidney doctor.  No refills needed at this time.    This is a list of the screening recommended for you and due dates:  Health Maintenance  Topic Date Due   Pneumococcal Vaccination (1 of 2 - PCV) Never done   Flu Shot  07/25/2024   Colon Cancer Screening  03/01/2027   DTaP/Tdap/Td vaccine (3 - Td or Tdap) 11/16/2030   Hepatitis C Screening  Completed   HIV Screening  Completed   Zoster (Shingles) Vaccine  Completed   HPV Vaccine  Aged Out   Meningitis B Vaccine  Aged Out   COVID-19 Vaccine  Discontinued      If you have any questions or concerns, please do not hesitate to call the office at [phone].  I look forward to our next visit and until then take care and stay safe.  Regards,   Valli Gaw, MD   [facility]

## 2024-04-29 NOTE — Progress Notes (Unsigned)
 Chart Review CC: Thyroid  function test monitoring in the setting of Hashimoto, c/b AF on amiodarone  and digoxin .   HPI:  - Initial amio load inpatient 06/2023. Oral maintenance s/p load thereafter 200 mg BID.  - 10/2023 TSH increase to upper end of normal, repeat 3 mo (on levothyroxine  137 mcg) - 12/25/23: ?? amio 400mg  twice daily x 2 weeks then 400 mg daily thereafter - 2/5: TSH 7.92 - 02/14/24: Ongoing PVC - reduce amio to 200 mg daily (3/17 reports worsened PVC) ? amio 300 mg daily. ?? Levothyroxine  150 mcg (cardiology) - 03/24/24: TSH 12.2. ?? levothyroxine  to 175 mcg daily  Lab Results  Component Value Date   TSH 2.722 04/15/2024   TSH 12.200 (H) 03/24/2024   TSH 7.92 (H) 01/30/2024   TSH 4.420 10/19/2023   TSH 4.699 (H) 06/15/2023   FREET4 1.20 (H) 04/15/2024   FREET4 1.77 03/24/2024   FREET4 1.00 06/15/2023   FREET4 0.98 07/26/2022   FREET4 0.87 02/11/2016    04/15/24: s/p levothyroxine  increase from 150 to 175 mcg  TSH WNL: 2.722 (WNL though decreased from 12.2 in 3 wk) T4 (free): 1.20 (H)  Recent changes possibly affecting thyroid  levels: - digoxin  start 04/15/24. Dig level 5/6 - Digoxin : increased digoxin  serum levels possible in hypothyroid; decreased digoxin  serum levels in hyperthyroid are possible. Digoxin  itself not necessarily associated with change in thyroid  hormones, rather the inverse is true.   Notably, TSH may begin to improve soon after levothyroxine  dose is changed, but new steady state not expected for ~6 weeks total. Last TSH significantly reduced from previous after 3 weeks. Amio dose has not changed further since February and should be steady.  Recommend monitoring for s/sx hypothyroidism. Reasonable to recheck at estimated steady state 6 weeks (~next week). Recommend recheck if any s/sx hypothyroid.   Daron Ellen, PharmD Clinical Pharmacist [facility] [phone]

## 2024-04-29 NOTE — Progress Notes (Signed)
 SUBJECTIVE:   Chief Complaint  Patient presents with   mood disorder     3 month follow up   HPI Follow up chronic disease management  Discussed the use of AI scribe software for clinical note transcription with the patient, who gave verbal consent to proceed.  History of Present Illness Bobby [name] is a 53 year old male who presents for a follow-up visit regarding his thyroid  condition and mood disorder.  He is currently on Synthroid  for his thyroid  condition, with no recent changes to his medication. He feels that his thyroid  levels need to be checked again, despite having been checked two weeks ago.  He is managing a mood disorder with sertraline  (Zoloft ) at a dose of 100 mg daily. He takes this medication consistently and notes an improvement in his mood, stating he feels 'better than I have' and 'moving forward'.  He experiences difficulty initiating urination, which he suspects might be a side effect of one of his medications. He is not currently taking duloxetine , which he acknowledges could have caused similar symptoms in the past.  He has an upcoming appointment for an electrocardiogram and has previously discussed with an electrophysiologist the potential need for an implantable cardioverter-defibrillator (ICD). He has not had any alerts from his cardiac device in the past three weeks.  He is also on Farxiga  and torsemide , which are expected to increase urination, and Noxafil. He mentions a history of blood clots and is hopeful that his current treatment has reduced them.    PERTINENT PMH / PSH: As above  OBJECTIVE:  BP (!) 97/53   Pulse (!) 54   Temp 98.2 F (36.8 C)   Resp 20   Ht 6\' 1"  (1.854 m)   Wt 225 lb 2 oz (102.1 kg)   SpO2 99%   BMI 29.70 kg/m    Physical Exam Vitals reviewed.  Constitutional:      General: He is not in acute distress.    Appearance: Normal appearance. He is not ill-appearing, toxic-appearing or diaphoretic.  Eyes:     General:         Right eye: No discharge.        Left eye: No discharge.  Cardiovascular:     Rate and Rhythm: Bradycardia present. Rhythm irregularly irregular.     Heart sounds: Normal heart sounds.     Comments: Wearing Lifepack battery  Pulmonary:     Effort: Pulmonary effort is normal.     Breath sounds: Normal breath sounds.  Abdominal:     General: Bowel sounds are normal.  Musculoskeletal:        General: Normal range of motion.     Cervical back: Normal range of motion.  Skin:    General: Skin is warm and dry.  Neurological:     Mental Status: He is alert and oriented to person, place, and time. Mental status is at baseline.  Psychiatric:        Mood and Affect: Mood normal.        Behavior: Behavior normal.        Thought Content: Thought content normal.        Judgment: Judgment normal.           04/29/2024   12:57 PM 01/30/2024    8:36 AM 01/09/2024    3:13 PM 11/28/2023    4:07 PM 09/06/2023    3:55 PM  Depression screen PHQ 2/9  Decreased Interest 1 3 3 1  0  Down, Depressed,  Hopeless 1 3 3  0 0  PHQ - 2 Score 2 6 6 1  0  Altered sleeping 1 3 3 1 1   Tired, decreased energy 1 3 3 1 1   Change in appetite 0 2 3 0 0  Feeling bad or failure about yourself  0 2 3 1  0  Trouble concentrating 1 3 3 1 2   Moving slowly or fidgety/restless 0 1 1 0 0  Suicidal thoughts 0 0 0 0 0  PHQ-9 Score 5 20 22 5 4   Difficult doing work/chores Somewhat difficult Extremely dIfficult Extremely dIfficult Not difficult at all Somewhat difficult      04/29/2024   12:57 PM 01/30/2024    8:37 AM 01/09/2024    3:13 PM 06/20/2023    1:08 PM  GAD 7 : Generalized Anxiety Score  Nervous, Anxious, on Edge 1 3 3 3   Control/stop worrying 0 1 3 0  Worry too much - different things 1 2 3 1   Trouble relaxing 1 3 3 3   Restless 2 3 3 3   Easily annoyed or irritable 1 3 3 1   Afraid - awful might happen 0 2 2 0  Total GAD 7 Score 6 17 20 11   Anxiety Difficulty Somewhat difficult Extremely difficult Extremely  difficult Somewhat difficult    ASSESSMENT/PLAN:  Hypothyroidism due to Hashimoto thyroiditis Assessment & Plan: Thyroid  levels stable. Levels decreased significantly with increasing dose 1 month ago.  Dose adjusted by Cardiology, increased Levothyroxine  from 137 mcg to 175 mcg.  Patient on Amiodarone  and now digoxin .  Patient reports feeling better with the increase dose - Recheck thyroid  levels today. -If levels lowering, would decrease dose by 25 mcg and repeat TSH in 4 weeks.  Orders: -     TSH -     Comprehensive metabolic panel with GFR  Mood disorder (HCC) Assessment & Plan: Depressed mood possibly linked to thyroid  disorder. On sertraline  100 mg daily. Doing well -Continue sertraline  100 mg daily.   Hypercoagulable state due to atrial fibrillation, unspecified type [facility] Assessment & Plan: Currently on Warfarin and follows with Coumadin  clinic -Continue to follow with Cardiology  Orders: -     CBC with Differential/Platelet  Ischemic cardiomyopathy -     Digoxin  level  CHF (congestive heart failure), NYHA class II, chronic, combined (HCC) Assessment & Plan: Recently started on Digoxin  for afib Check digoxin  level.  Not symptomatic  Orders: -     Digoxin  level  Unstable angina (HCC) Assessment & Plan: Continue current medication Continue to follow with Cardiology for management Lifepack in place   Paroxysmal atrial fibrillation [facility]) Assessment & Plan: Patient has a history of PVCs and is currently on Amiodarone . Patient reports feeling PVCs and has a LifeVest that has gone off multiple times. Patient is also on Warfarin. -Continue Amiodarone  200 mg daily -Carvedilol  switched to Metoprolol  by Cardiology -Currently on Wafarin and followed by Coumadin  clinic -Follow up with Cardiology for management of current medications.   Decreased GFR Assessment & Plan: Stage 3a CKD Continues to decrease, likley related to CHF and medications Recommend Nephrology  referral if continues to lower.    Other orders -     TIQ-NTM     PDMP reviewed  Return if symptoms worsen or fail to improve, for PCP.  Valli Gaw, MD

## 2024-04-29 NOTE — Progress Notes (Unsigned)
 Electrophysiology Office Follow up Visit Note:    Date:  04/30/2024   ID:  Bobby [name], DOB 22-Oct-1971, MRN [id number]  PCP:  Valli Gaw, MD  [facility] Cardiologist:  Belva Boyden, MD  [facility] Electrophysiologist:  Richardo Chandler, MD    Interval History:     Bobby [name] is a 53 y.o. male who presents for a follow up visit.   [name] is a 53 year old man who I am seeing today to discuss ICD therapy.  He has previously been followed by Dr. Rodolfo Clan and follows with Dr. Julane Ny in the heart failure clinic.  He has a history of coronary artery disease with prior CABG in 2024, ischemic cardiomyopathy complicated by an LV apical thrombus, depression, hypothyroidism, hypertension, sleep apnea, paroxysmal atrial fibrillation.  He last saw Dr. Julane Ny April 15, 2024.  He was wearing a LifeVest at that appointment.  A March cardiac MRI showed an ejection fraction of 31% with significant LGE.  He is with his wife today in clinic.  He feels palpitations but these have improved some from the period of time immediately after his bypass.  He still feels intermittent burning on his sternum where it is still healing.       Past medical, surgical, social and family history were reviewed.  ROS:   Please see the history of present illness.    All other systems reviewed and are negative.  EKGs/Labs/Other Studies Reviewed:    The following studies were reviewed today:  April 16, 2024 ZIO monitor No atrial fibrillation, frequent ventricular ectopy 6.3%  March 06, 2024 cardiac MRI EF 31% Dilated LV LGE, transmural, LV mid to apical anterior and anteroseptal walls Small apical thrombus  December 20, 2023 echo EF 25 to 30% Chronic and acute mural thrombus Mildly reduced RV function Moderately dilated left atrium  April 15, 2024 EKG shows sinus rhythm, nonspecific interventricular conduction delay, left axis deviation  EKG Interpretation Date/Time:  Wednesday Apr 30 2024  09:58:42 EDT Ventricular Rate:  60 PR Interval:  190 QRS Duration:  112 QT Interval:  452 QTC Calculation: 452 R Axis:   -45  Text Interpretation: Sinus rhythm with occasional Premature ventricular complexes Left axis deviation Incomplete left bundle branch block Confirmed by Harvie Liner [phone]) on 04/30/2024 10:02:01 AM    Physical Exam:    VS:  BP 98/62 (BP Location: Left Arm, Patient Position: Sitting, Cuff Size: Large)   Pulse (!) 52   Ht 6\' 1"  (1.854 m)   Wt 223 lb (101.2 kg)   SpO2 95%   BMI 29.42 kg/m     Wt Readings from Last 3 Encounters:  04/30/24 223 lb (101.2 kg)  04/29/24 225 lb 2 oz (102.1 kg)  04/15/24 220 lb 12.8 oz (100.2 kg)     GEN: no distress.  LifeVest in place CARD: RRR, No MRG RESP: No IWOB. CTAB.      ASSESSMENT:    1. Chronic HFrEF (heart failure with reduced ejection fraction) (HCC)   2. Ischemic cardiomyopathy    PLAN:    In order of problems listed above:  #Chronic systolic heart failure #Coronary artery disease #Ischemic cardiomyopathy NYHA class II.  Warm and dry on exam.  EF 31%.  Significant LGE on cardiac MRI.  We discussed his risk for sudden cardiac death during today's clinic appointment.  We discussed the role of defibrillator therapy and reducing his risk for sudden cardiac death.  No plans for defibrillation threshold testing given history of mural thrombus.  He  will need a dual-chamber ICD.  Plan for [employer] device.  Prior to ICD implant, need to confirm resolution of thrombus. If no thrombus on repeat TTE, plan to schedule ICD implant.  The patient has an ischemic CM (EF 31%), NYHA Class III CHF, and CAD.  He is referred by Dr Bensimhon for risk stratification of sudden death and consideration of ICD implantation.  At this time, he meets criteria for ICD implantation for primary prevention of sudden death.  I have had a thorough discussion with the patient reviewing options.  The patient and their family (if  available) have had opportunities to ask questions and have them answered. The patient and I have decided together through a shared decision making process to proceed with ICD implant at this time.    Risks, benefits, alternatives to ICD implantation were discussed in detail with the patient today. The patient understands that the risks include but are not limited to bleeding, infection, pneumothorax, perforation, tamponade, vascular damage, renal failure, MI, stroke, death, inappropriate shocks, and lead dislodgement and wishes to proceed.  We will therefore schedule device implantation at the next available time.  Continue Coumadin  around the time of his procedure given LV thrombus on prior imaging  Hold aspirin  for 5 days prior to the procedure.  Pros and cons of holding aspirin  were discussed in detail the patient during today's clinic appointment.  #Atrial fibrillation Coumadin  as above.  Plan for dual-chamber device  Signed, Harvie Liner, MD, [facility], [facility] 04/30/2024 10:28 AM    Electrophysiology [facility]

## 2024-04-30 ENCOUNTER — Ambulatory Visit: Attending: Cardiology | Admitting: Cardiology

## 2024-04-30 ENCOUNTER — Encounter: Payer: Self-pay | Admitting: Cardiology

## 2024-04-30 VITALS — BP 98/62 | HR 52 | Ht 73.0 in | Wt 223.0 lb

## 2024-04-30 DIAGNOSIS — I255 Ischemic cardiomyopathy: Secondary | ICD-10-CM | POA: Diagnosis not present

## 2024-04-30 DIAGNOSIS — I480 Paroxysmal atrial fibrillation: Secondary | ICD-10-CM

## 2024-04-30 DIAGNOSIS — I5022 Chronic systolic (congestive) heart failure: Secondary | ICD-10-CM

## 2024-04-30 LAB — CBC WITH DIFFERENTIAL/PLATELET
Basophils Absolute: 0.1 K/uL (ref 0.0–0.1)
Basophils Relative: 1 % (ref 0.0–3.0)
Eosinophils Absolute: 0.1 K/uL (ref 0.0–0.7)
Eosinophils Relative: 1.6 % (ref 0.0–5.0)
HCT: 38.3 % — ABNORMAL LOW (ref 39.0–52.0)
Hemoglobin: 12.3 g/dL — ABNORMAL LOW (ref 13.0–17.0)
Lymphocytes Relative: 24.1 % (ref 12.0–46.0)
Lymphs Abs: 2.2 K/uL (ref 0.7–4.0)
MCHC: 32.1 g/dL (ref 30.0–36.0)
MCV: 85.7 fl (ref 78.0–100.0)
Monocytes Absolute: 0.7 K/uL (ref 0.1–1.0)
Monocytes Relative: 7.9 % (ref 3.0–12.0)
Neutro Abs: 5.9 K/uL (ref 1.4–7.7)
Neutrophils Relative %: 65.4 % (ref 43.0–77.0)
Platelets: 209 K/uL (ref 150.0–400.0)
RBC: 4.47 Mil/uL (ref 4.22–5.81)
RDW: 17.9 % — ABNORMAL HIGH (ref 11.5–15.5)
WBC: 9.1 K/uL (ref 4.0–10.5)

## 2024-04-30 LAB — COMPREHENSIVE METABOLIC PANEL WITH GFR
ALT: 92 U/L — ABNORMAL HIGH (ref 0–53)
AST: 43 U/L — ABNORMAL HIGH (ref 0–37)
Albumin: 4 g/dL (ref 3.5–5.2)
Alkaline Phosphatase: 79 U/L (ref 39–117)
BUN: 23 mg/dL (ref 6–23)
CO2: 27 meq/L (ref 19–32)
Calcium: 9.2 mg/dL (ref 8.4–10.5)
Chloride: 105 meq/L (ref 96–112)
Creatinine, Ser: 1.57 mg/dL — ABNORMAL HIGH (ref 0.40–1.50)
GFR: 49.69 mL/min — ABNORMAL LOW
Glucose, Bld: 88 mg/dL (ref 70–99)
Potassium: 4.3 meq/L (ref 3.5–5.1)
Sodium: 140 meq/L (ref 135–145)
Total Bilirubin: 0.4 mg/dL (ref 0.2–1.2)
Total Protein: 6.5 g/dL (ref 6.0–8.3)

## 2024-04-30 LAB — TSH: TSH: 3.71 u[IU]/mL (ref 0.35–5.50)

## 2024-04-30 NOTE — Patient Instructions (Signed)
 Medication Instructions:  Your physician recommends that you continue on your current medications as directed. Please refer to the Current Medication list given to you today.  *If you need a refill on your cardiac medications before your next appointment, please call your pharmacy*  Testing/Procedures: Echocardiogram  Your physician has requested that you have an echocardiogram. Echocardiography is a painless test that uses sound waves to create images of your heart. It provides your doctor with information about the size and shape of your heart and how well your heart's chambers and valves are working. This procedure takes approximately one hour. There are no restrictions for this procedure. Please do NOT wear cologne, perfume, aftershave, or lotions (deodorant is allowed). Please arrive 15 minutes prior to your appointment time.  Please note: We ask at that you not bring children with you during ultrasound (echo/ vascular) testing. Due to room size and safety concerns, children are not allowed in the ultrasound rooms during exams. Our front office staff cannot provide observation of children in our lobby area while testing is being conducted. An adult accompanying a patient to their appointment will only be allowed in the ultrasound room at the discretion of the ultrasound technician under special circumstances. We apologize for any inconvenience.  Follow-Up: At [facility], you and your health needs are our priority.  As part of our continuing mission to provide you with exceptional heart care, our providers are all part of one team.  This team includes your primary Cardiologist (physician) and Advanced Practice Providers or APPs (Physician Assistants and Nurse Practitioners) who all work together to provide you with the care you need, when you need it.  Your next appointment:   We will contact you after your echocardiogram.

## 2024-05-04 ENCOUNTER — Encounter: Payer: Self-pay | Admitting: Family Medicine

## 2024-05-04 DIAGNOSIS — R944 Abnormal results of kidney function studies: Secondary | ICD-10-CM | POA: Insufficient documentation

## 2024-05-04 NOTE — Assessment & Plan Note (Signed)
 Patient has a history of PVCs and is currently on Amiodarone . Patient reports feeling PVCs and has a LifeVest that has gone off multiple times. Patient is also on Warfarin. -Continue Amiodarone  200 mg daily -Carvedilol  switched to Metoprolol  by Cardiology -Currently on Wafarin and followed by Coumadin  clinic -Follow up with Cardiology for management of current medications.

## 2024-05-04 NOTE — Assessment & Plan Note (Addendum)
 Thyroid  levels stable. Levels decreased significantly with increasing dose 1 month ago.  Dose adjusted by Cardiology, increased Levothyroxine  from 137 mcg to 175 mcg.  Patient on Amiodarone  and now digoxin .  Patient reports feeling better with the increase dose - Recheck thyroid  levels today. -If levels lowering, would decrease dose by 25 mcg and repeat TSH in 4 weeks.

## 2024-05-04 NOTE — Assessment & Plan Note (Signed)
 Stage 3a CKD Continues to decrease, likley related to CHF and medications Recommend Nephrology referral if continues to lower.

## 2024-05-04 NOTE — Assessment & Plan Note (Signed)
 Depressed mood possibly linked to thyroid  disorder. On sertraline  100 mg daily. Doing well -Continue sertraline  100 mg daily.

## 2024-05-04 NOTE — Assessment & Plan Note (Signed)
 Continue current medication Continue to follow with Cardiology for management [place] in place

## 2024-05-04 NOTE — Assessment & Plan Note (Signed)
 Recently started on Digoxin  for afib Check digoxin  level.  Not symptomatic

## 2024-05-04 NOTE — Assessment & Plan Note (Signed)
 Currently on Warfarin and follows with Coumadin  clinic -Continue to follow with Cardiology

## 2024-05-06 LAB — TIQ-NTM

## 2024-05-06 LAB — DIGOXIN LEVEL: Digoxin Level: 0.9 ug/L (ref 0.8–2.0)

## 2024-05-08 ENCOUNTER — Encounter: Payer: Self-pay | Admitting: Cardiology

## 2024-05-08 ENCOUNTER — Ambulatory Visit: Attending: Cardiology

## 2024-05-08 DIAGNOSIS — I5022 Chronic systolic (congestive) heart failure: Secondary | ICD-10-CM | POA: Diagnosis not present

## 2024-05-08 DIAGNOSIS — I255 Ischemic cardiomyopathy: Secondary | ICD-10-CM

## 2024-05-08 LAB — ECHOCARDIOGRAM COMPLETE
AR max vel: 3.32 cm2
AV Area VTI: 3.76 cm2
AV Area mean vel: 3.25 cm2
AV Mean grad: 3 mmHg
AV Peak grad: 5.6 mmHg
Ao pk vel: 1.18 m/s
Calc EF: 39.9 %
S' Lateral: 5 cm
Single Plane A2C EF: 41.3 %
Single Plane A4C EF: 34.7 %

## 2024-05-08 MED ORDER — PERFLUTREN LIPID MICROSPHERE: 3.0000 mL | INTRAVENOUS | Status: AC | PRN

## 2024-05-13 ENCOUNTER — Ambulatory Visit: Payer: Self-pay | Admitting: Cardiology

## 2024-05-13 DIAGNOSIS — I5022 Chronic systolic (congestive) heart failure: Secondary | ICD-10-CM

## 2024-05-14 NOTE — Telephone Encounter (Signed)
Echo has been ordered

## 2024-05-20 ENCOUNTER — Other Ambulatory Visit: Payer: Self-pay

## 2024-05-26 ENCOUNTER — Telehealth: Payer: Self-pay | Admitting: Internal Medicine

## 2024-05-26 DIAGNOSIS — I252 Old myocardial infarction: Secondary | ICD-10-CM | POA: Diagnosis not present

## 2024-05-26 DIAGNOSIS — I420 Dilated cardiomyopathy: Secondary | ICD-10-CM | POA: Diagnosis not present

## 2024-05-26 NOTE — Telephone Encounter (Signed)
 Called to confirm/remind patient of their appointment at the Advanced Heart Failure Clinic on 05/27/24.   Appointment:   [x] Confirmed  [] Left mess   [] No answer/No voice mail  [] VM Full/unable to leave message  [] Phone not in service  Patient reminded to bring all medications and/or complete list.  Confirmed patient has transportation. Gave directions, instructed to utilize valet parking.

## 2024-05-27 ENCOUNTER — Ambulatory Visit: Attending: Internal Medicine | Admitting: Internal Medicine

## 2024-05-27 ENCOUNTER — Other Ambulatory Visit: Payer: Self-pay

## 2024-05-27 ENCOUNTER — Encounter: Payer: Self-pay | Admitting: Internal Medicine

## 2024-05-27 VITALS — BP 110/75 | HR 52 | Wt 216.4 lb

## 2024-05-27 DIAGNOSIS — Z955 Presence of coronary angioplasty implant and graft: Secondary | ICD-10-CM | POA: Diagnosis not present

## 2024-05-27 DIAGNOSIS — I252 Old myocardial infarction: Secondary | ICD-10-CM | POA: Insufficient documentation

## 2024-05-27 DIAGNOSIS — I493 Ventricular premature depolarization: Secondary | ICD-10-CM | POA: Diagnosis not present

## 2024-05-27 DIAGNOSIS — Z8249 Family history of ischemic heart disease and other diseases of the circulatory system: Secondary | ICD-10-CM | POA: Diagnosis not present

## 2024-05-27 DIAGNOSIS — Z79890 Hormone replacement therapy (postmenopausal): Secondary | ICD-10-CM | POA: Diagnosis not present

## 2024-05-27 DIAGNOSIS — Z951 Presence of aortocoronary bypass graft: Secondary | ICD-10-CM | POA: Insufficient documentation

## 2024-05-27 DIAGNOSIS — Z87891 Personal history of nicotine dependence: Secondary | ICD-10-CM | POA: Diagnosis not present

## 2024-05-27 DIAGNOSIS — F32A Depression, unspecified: Secondary | ICD-10-CM | POA: Diagnosis not present

## 2024-05-27 DIAGNOSIS — E039 Hypothyroidism, unspecified: Secondary | ICD-10-CM | POA: Diagnosis not present

## 2024-05-27 DIAGNOSIS — Z86718 Personal history of other venous thrombosis and embolism: Secondary | ICD-10-CM | POA: Diagnosis not present

## 2024-05-27 DIAGNOSIS — I2510 Atherosclerotic heart disease of native coronary artery without angina pectoris: Secondary | ICD-10-CM

## 2024-05-27 DIAGNOSIS — Z7901 Long term (current) use of anticoagulants: Secondary | ICD-10-CM | POA: Insufficient documentation

## 2024-05-27 DIAGNOSIS — Z79899 Other long term (current) drug therapy: Secondary | ICD-10-CM | POA: Insufficient documentation

## 2024-05-27 DIAGNOSIS — I110 Hypertensive heart disease with heart failure: Secondary | ICD-10-CM | POA: Diagnosis not present

## 2024-05-27 DIAGNOSIS — G4700 Insomnia, unspecified: Secondary | ICD-10-CM | POA: Diagnosis not present

## 2024-05-27 DIAGNOSIS — R9439 Abnormal result of other cardiovascular function study: Secondary | ICD-10-CM | POA: Diagnosis not present

## 2024-05-27 DIAGNOSIS — Z7984 Long term (current) use of oral hypoglycemic drugs: Secondary | ICD-10-CM | POA: Diagnosis not present

## 2024-05-27 DIAGNOSIS — Z7982 Long term (current) use of aspirin: Secondary | ICD-10-CM | POA: Insufficient documentation

## 2024-05-27 DIAGNOSIS — I5022 Chronic systolic (congestive) heart failure: Secondary | ICD-10-CM | POA: Diagnosis not present

## 2024-05-27 DIAGNOSIS — G4733 Obstructive sleep apnea (adult) (pediatric): Secondary | ICD-10-CM | POA: Insufficient documentation

## 2024-05-27 DIAGNOSIS — I480 Paroxysmal atrial fibrillation: Secondary | ICD-10-CM | POA: Insufficient documentation

## 2024-05-27 MED ORDER — TRAZODONE HCL 100 MG PO TABS
100.0000 mg | ORAL_TABLET | Freq: Every evening | ORAL | 0 refills | Status: AC | PRN
  Filled 2024-05-27: qty 20, 20d supply, fill #0

## 2024-05-27 NOTE — Patient Instructions (Signed)
 Medication Changes:  START Trazadone 100mg  (1 tab) as needed at bedtime  Follow-Up in: Please follow up with the Advanced Heart Failure Clinic in 2 months with Dr. Bensimhon.  At the Advanced Heart Failure Clinic, you and your health needs are our priority. We have a designated team specialized in the treatment of Heart Failure. This Care Team includes your primary Heart Failure Specialized Cardiologist (physician), Advanced Practice Providers (APPs- Physician Assistants and Nurse Practitioners), and Pharmacist who all work together to provide you with the care you need, when you need it.   You may see any of the following providers on your designated Care Team at your next follow up:  Dr. Jules Oar Dr. Peder Bourdon Dr. Alwin Baars Dr. Judyth Nunnery Shawnee Dellen, FNP Bevely Brush, RPH-CPP  Please be sure to bring in all your medications bottles to every appointment.   Need to Contact Us :  If you have any questions or concerns before your next appointment please send us  a message through [place] or call our office at [phone].    TO LEAVE A MESSAGE FOR THE NURSE SELECT OPTION 2, PLEASE LEAVE A MESSAGE INCLUDING: YOUR NAME DATE OF BIRTH CALL BACK NUMBER REASON FOR CALL**this is important as we prioritize the call backs  YOU WILL RECEIVE A CALL BACK THE SAME DAY AS LONG AS YOU CALL BEFORE 4:00 PM

## 2024-05-27 NOTE — Progress Notes (Signed)
 Advanced Heart Failure Clinic Consult  Note    Referring Physician: Richardo Chandler, MD PCP: Valli Gaw, MD - no loner in practice. Awaiting new PCP Cardiologist: Timothy Gollan, MD / Morey Ar, NP (last seen 12/24)  Chief Complaint: f/u for chronic systolic heart failure   HPI:  Bobby [name] is a 53 y/o male referred by Dr Rodolfo Clan for further evaluation of his HF.   He has a history of CAD w/ stent 07/ 2018, CABG 06/24, frequent PVC's, LV apical clot, depression, hypothyroidism, HTN, OSA, PAF and chronic heart failure. He has his first MI at the age of 17. Strong history of CAD in his dad and his paternal grandma. No HF in the family that he's aware of.   Patient with extensive cardiac history. He was previously followed by Dr. Filiberto Hug. He underwent stenting x 2 to RCA in 2003. In July 2018 he presented with an anterior STEMI and was taken emergently to the the Cath Lab. LHC revealed in-stent CTO of RCA, ostial LAD 100%, diagonal 80%, OM 70% with left-to-right collaterals. He underwent PCI with DES to ostial LAD. Impella was placed secondary to cardiogenic shock. Echo showed EF 30 to 35%. Relook LHC August 2018 showed patent stent. cMRI 11/18 had shown LGE consistent with full-thickness clot in the context of his coronary artery disease.   Echo July 2021 showed EF 40 to 45%. Stress test August 2023 showed abnormal perfusion with fixed defect, EF 41%, intermediate risk.   LHC August 2021 secondary to abnormal stress test was essentially unchanged from previous.   Patient presented to the ED on 06/15/2023 with chest pain and found to have new onset A-fib. He was started on Eliquis  and spontaneously converted in the ER. Troponin 97. He was seen in the office by Dr. Gollan for follow-up and continued to complain of chest pain. Outpatient heart catheterization was scheduled. Echo showed EF 25 to 30%. LHC showed significant two-vessel CAD  LAD stent patent, 90 % ostial LCx (jailed by LAD  stent), RCA 100% mid. Transferred to [facility] for CTS evaluation. He underwent CABG x 1 on 06/25/2023 (LIMA to OM-1)   cMRI 03/05/24:  1. Moderate-severely reduced systolic function (LVEF = 31%).  2. Subendocardial LGE in the LV mid to apical anterior and anteroseptal walls, apical walls.  3. Small apical thrombus noted, measures about 10 x 4 mm.  4. Moderately reduced RV function.  5. Findings suggest ischemic cardiomyopathy with prior LAD infarct.  He was unable to tolerate losartan  as it made his BP too low so it was stopped.   Saw Dr. Marven Slimmer 04/30/24 plan for ICD. Repeat echo ordered   Echo 5/25 EF 35-40% RV mildly reduced. Mild Bobby -> Dr. Marven Slimmer suggested repeat echo 6-8 weeks  Here for f/u with his wife. Still wearing Lifevest. Says he feels a little better but very fatigued. Struggling with insomnia and depression. Unable to use CPAP for past 2 weeks. Fluid ok. Can skip lasix  2x/week. Occasional CP. BP at home usually 100/60   ROS: All systems negative except what is listed in HPI, PMH and Problem List   Past Medical History:  Diagnosis Date   Acute MI anterior wall first episode care (HCC) 07/13/2017   Occluded ostial LAD S/P 3.5x26 Onyx DES. RI 80%. RCA Occluded at prior stent site with collaterals    Cardiogenic shock (HCC) 07/13/2017   CHF (congestive heart failure) (HCC)    Graves disease    Hypertension    MI (myocardial infarction) (  HCC)    Thyroid  disease    thyroid  irradiated per pt    Current Outpatient Medications  Medication Sig Dispense Refill   amiodarone  (PACERONE ) 200 MG tablet Take 2 tablets (400 mg total) by mouth 2 (two) times daily for 14 days, THEN 1.5 tablets (300 mg total) daily. 191 tablet 3   aspirin  81 MG chewable tablet Chew 1 tablet (81 mg total) by mouth daily. 30 tablet 11   atorvastatin  (LIPITOR) 20 MG tablet Take 1 tablet (20 mg total) by mouth daily. 90 tablet 3   dapagliflozin  propanediol (FARXIGA ) 10 MG TABS tablet Take 1 tablet (10 mg  total) by mouth daily before breakfast. 90 tablet 3   digoxin  (LANOXIN ) 0.125 MG tablet Take 1 tablet (0.125 mg total) by mouth daily. 30 tablet 5   Evolocumab  (REPATHA  SURECLICK) 140 MG/ML SOAJ Inject 140mg  into the skin once every 14 days. 6 mL 3   ezetimibe  (ZETIA ) 10 MG tablet Take 1 tablet (10 mg total) by mouth daily. 90 tablet 3   levothyroxine  (SYNTHROID ) 175 MCG tablet Take 1 tablet (175 mcg total) by mouth daily. 60 tablet 1   metoprolol  succinate (TOPROL -XL) 25 MG 24 hr tablet Take 1 tablet (25 mg total) by mouth at bedtime. 90 tablet 2   mexiletine (MEXITIL ) 150 MG capsule Take 1 capsule (150 mg total) by mouth 2 (two) times daily. 180 capsule 1   pantoprazole  (PROTONIX ) 40 MG tablet Take 1 tablet (40 mg total) by mouth 2 (two) times daily. 60 tablet 3   potassium chloride  SA (KLOR-CON  M20) 20 MEQ tablet Take 1 tablet (20 mEq total) by mouth daily as needed. Take as needed with Lasix . 90 tablet 3   sertraline  (ZOLOFT ) 100 MG tablet Take 1 tablet (100 mg total) by mouth daily. 90 tablet 3   sildenafil  (VIAGRA ) 100 MG tablet Take 1 tablet (100 mg total) by mouth as needed for erectile dysfunction. TAKE 1 (ONE) TABLET AS NEEDED. DO NOT USE AT SAME TIME AS NITRO 30 tablet 3   torsemide  (DEMADEX ) 20 MG tablet Take 2 tablets (40 mg total) by mouth daily. 180 tablet 3   warfarin (COUMADIN ) 5 MG tablet Take 0.5-1 tablets (2.5-5 mg total) by mouth daily as directed by Anticoagulation Clinic. 60 tablet 0   No current facility-administered medications for this visit.    No Known Allergies    Social History   Socioeconomic History   Marital status: Married    Spouse name: Not on file   Number of children: 2   Years of education: Not on file   Highest education level: Associate degree: occupational, Scientist, product/process development, or vocational program  Occupational History   Not on file  Tobacco Use   Smoking status: Former    Current packs/day: 0.00    Average packs/day: 0.5 packs/day for 20.0 years  (10.0 ttl pk-yrs)    Types: Cigarettes    Start date: 07/13/1997    Quit date: 07/13/2017    Years since quitting: 6.8   Smokeless tobacco: Never   Tobacco comments:    quit 2012  Vaping Use   Vaping status: Never Used  Substance and Sexual Activity   Alcohol use: Not Currently    Comment: occ   Drug use: Not Currently   Sexual activity: Yes  Other Topics Concern   Not on file  Social History Narrative   Lives with fiance, working FT , disesel equip, right handed, 2 kids, college edu   Social Drivers of Health  Financial Resource Strain: Low Risk  (01/09/2024)   Overall Financial Resource Strain (CARDIA)    Difficulty of Paying Living Expenses: Not hard at all  Food Insecurity: No Food Insecurity (01/09/2024)   Hunger Vital Sign    Worried About Running Out of Food in the Last Year: Never true    Ran Out of Food in the Last Year: Never true  Transportation Needs: No Transportation Needs (01/09/2024)   PRAPARE - Administrator, Civil Service (Medical): No    Lack of Transportation (Non-Medical): No  Physical Activity: Unknown (01/09/2024)   Exercise Vital Sign    Days of Exercise per Week: 0 days    Minutes of Exercise per Session: Not on file  Recent Concern: Physical Activity - Inactive (01/09/2024)   Exercise Vital Sign    Days of Exercise per Week: 0 days    Minutes of Exercise per Session: 60 min  Stress: Stress Concern Present (01/09/2024)   [employer] of Occupational Health - Occupational Stress Questionnaire    Feeling of Stress : Very much  Social Connections: Moderately Integrated (01/09/2024)   Social Connection and Isolation Panel [NHANES]    Frequency of Communication with Friends and Family: Three times a week    Frequency of Social Gatherings with Friends and Family: Once a week    Attends Religious Services: 1 to 4 times per year    Active Member of [employer] or Organizations: No    Attends Engineer, structural: Not on file    Marital  Status: Married  Catering manager Violence: Not At Risk (06/22/2023)   Humiliation, Afraid, Rape, and Kick questionnaire    Fear of Current or Ex-Partner: No    Emotionally Abused: No    Physically Abused: No    Sexually Abused: No      Family History  Problem Relation Age of Onset   Valvular heart disease Mother    Heart disease Father 100       CABG   Hypertension Father    Heart disease Paternal Grandmother    There were no vitals filed for this visit.   Wt Readings from Last 3 Encounters:  04/30/24 223 lb (101.2 kg)  04/29/24 225 lb 2 oz (102.1 kg)  04/15/24 220 lb 12.8 oz (100.2 kg)   Lab Results  Component Value Date   CREATININE 1.57 (H) 04/29/2024   CREATININE 1.80 (H) 04/15/2024   CREATININE 1.57 (H) 03/26/2024   PHYSICAL EXAM: General:  Well appearing. No resp difficulty HEENT: normal Neck: supple. no JVD. Carotids 2+ bilat; no bruits. No lymphadenopathy or thryomegaly appreciated. Cor: PMI nondisplaced. Regular rate & rhythm. No rubs, gallops or murmurs. + Lifevest Lungs: clear Abdomen: soft, nontender, nondistended. No hepatosplenomegaly. No bruits or masses. Good bowel sounds. Extremities: no cyanosis, clubbing, rash, edema Neuro: alert & orientedx3, cranial nerves grossly intact. moves all 4 extremities w/o difficulty. Affect flat   ECG: n/a    ASSESSMENT & PLAN:  1. Chronic systolic HF due to iCM - EF chronically low since 2018 (30-35%), multiple PCIs, eventual CABG   - RHC 6/24 (pre CABG) RA 7, PA 28/15, PCW 16, PA sat 71%, FICK CO/CI 5.1/2.3  - Echo 12/20/23: EF 25-30%, chronic thrombus in apex, RV mildly reduced, moderate LAE - cMRI 03/05/24: LVEF 31%, RVEF 36%, Subendocardial LGE in the LV mid to apical anterior and anteroseptal walls, apical walls c/w ICM, + small apical thrombus  - Echo 5/25 EF 35-40% RV ok. Mild Bobby No LV  clot noted (Dr. Marven Slimmer repeating echo in 6-8 weeks to reassess eligibility for ICD) - Stable NYHA III - Volume ok  -  continue farxiga  10mg  daily - continue torsemide  40mg  daily/  potassium . Recent labs reviewed and ok.  - continue metoprolol  succinate 25mg  daily - ARB and spiro stopped due to hypotension  - Continue digoxin  0.125 - BP too low to titrate GDMT - Await repeat echo to decide on ICD. If not ICD candidate would consider Barostim - Proceed with CPX testing next visit  2. Atrial fibrillation/ PVC's- - saw EF ([facility]) 03/25 - controlled on amiodarone  300mg  daily and mexiletine 150mg  BID - zio 2/25: Sinus Rhythm avg HR 67 Occasional PACs. Rare PVCs - Zio 4/25 Sinus avg 68 Frequent PVCs 6.3% - recent amio labs ok   3. CAD- - 1st MI at age 45 treated w/ PCI, CABG x 1 06/24 (LIMA-OM1)  - continue ASA 81mg  daily - continue atorvastatin  20mg  daily - continue repatha  140mg  every 2 weeks - continue ezetimibe  10mg  daily - no significant s/s angina - manage dby Gen Cards  4. Hypothyroidism- - likely due to amio - continue synthroid   - TSH ok (3.7 on 04/29/24)  5. OSA- - encouraged him to remain complaint  6. Depression/insomnia - on Sertraline  - I think this is contributing to fatigue and exercise intolerance - have strongly suggested counseling - will try trazodone for sleep if QT ok    Bobby Oar, MD 05/27/24

## 2024-05-28 ENCOUNTER — Other Ambulatory Visit: Payer: Self-pay | Admitting: Internal Medicine

## 2024-05-28 ENCOUNTER — Other Ambulatory Visit: Payer: Self-pay

## 2024-05-28 ENCOUNTER — Ambulatory Visit: Attending: Cardiovascular Disease

## 2024-05-28 DIAGNOSIS — I25110 Atherosclerotic heart disease of native coronary artery with unstable angina pectoris: Secondary | ICD-10-CM

## 2024-05-28 DIAGNOSIS — I255 Ischemic cardiomyopathy: Secondary | ICD-10-CM

## 2024-05-28 DIAGNOSIS — I5042 Chronic combined systolic (congestive) and diastolic (congestive) heart failure: Secondary | ICD-10-CM

## 2024-05-28 DIAGNOSIS — Z5181 Encounter for therapeutic drug level monitoring: Secondary | ICD-10-CM

## 2024-05-28 DIAGNOSIS — I493 Ventricular premature depolarization: Secondary | ICD-10-CM

## 2024-05-28 DIAGNOSIS — I5020 Unspecified systolic (congestive) heart failure: Secondary | ICD-10-CM

## 2024-05-28 DIAGNOSIS — Z951 Presence of aortocoronary bypass graft: Secondary | ICD-10-CM

## 2024-05-28 DIAGNOSIS — I480 Paroxysmal atrial fibrillation: Secondary | ICD-10-CM

## 2024-05-28 LAB — POCT INR: INR: 2.5 (ref 2.0–3.0)

## 2024-05-28 MED ORDER — WARFARIN SODIUM 5 MG PO TABS
2.5000 mg | ORAL_TABLET | Freq: Every day | ORAL | 1 refills | Status: DC
  Filled 2024-05-28: qty 60, 60d supply, fill #0
  Filled 2024-06-15 – 2024-07-30 (×2): qty 60, 60d supply, fill #1

## 2024-05-28 NOTE — Patient Instructions (Signed)
 Continue taking 2.5 mg  DAILY, EXCEPT 5 mg EVERY MONDAY and FRIDAY.  INR in 6 weeks.  [phone]

## 2024-05-29 ENCOUNTER — Ambulatory Visit: Payer: Commercial Managed Care - PPO | Admitting: Internal Medicine

## 2024-06-15 ENCOUNTER — Other Ambulatory Visit: Payer: Self-pay | Admitting: Family Medicine

## 2024-06-15 ENCOUNTER — Other Ambulatory Visit: Payer: Self-pay | Admitting: Internal Medicine

## 2024-06-15 ENCOUNTER — Other Ambulatory Visit: Payer: Self-pay

## 2024-06-15 DIAGNOSIS — K219 Gastro-esophageal reflux disease without esophagitis: Secondary | ICD-10-CM

## 2024-06-16 ENCOUNTER — Other Ambulatory Visit: Payer: Self-pay

## 2024-06-16 ENCOUNTER — Telehealth (HOSPITAL_COMMUNITY): Payer: Self-pay | Admitting: Internal Medicine

## 2024-06-16 ENCOUNTER — Other Ambulatory Visit (HOSPITAL_COMMUNITY): Payer: Self-pay | Admitting: *Deleted

## 2024-06-16 DIAGNOSIS — I5020 Unspecified systolic (congestive) heart failure: Secondary | ICD-10-CM

## 2024-06-16 NOTE — Telephone Encounter (Signed)
 front office left pt a voice message to schedule a cardiopumonary exercise test ordered by Dr. Bensimhon. instructed pt to call front office back to get scheduled.

## 2024-06-17 ENCOUNTER — Other Ambulatory Visit: Payer: Self-pay | Admitting: Family

## 2024-06-17 ENCOUNTER — Other Ambulatory Visit: Payer: Self-pay

## 2024-06-17 MED FILL — Pantoprazole Sodium EC Tab 40 MG (Base Equiv): ORAL | 90 days supply | Qty: 180 | Fill #0 | Status: AC

## 2024-06-17 MED FILL — Mexiletine HCl Cap 150 MG: ORAL | 90 days supply | Qty: 180 | Fill #0 | Status: AC

## 2024-06-19 ENCOUNTER — Other Ambulatory Visit: Payer: Self-pay

## 2024-06-25 DIAGNOSIS — I252 Old myocardial infarction: Secondary | ICD-10-CM | POA: Diagnosis not present

## 2024-06-25 DIAGNOSIS — I420 Dilated cardiomyopathy: Secondary | ICD-10-CM | POA: Diagnosis not present

## 2024-06-26 ENCOUNTER — Ambulatory Visit (HOSPITAL_COMMUNITY): Attending: Cardiology

## 2024-06-26 DIAGNOSIS — I5020 Unspecified systolic (congestive) heart failure: Secondary | ICD-10-CM | POA: Diagnosis not present

## 2024-06-30 ENCOUNTER — Ambulatory Visit (HOSPITAL_COMMUNITY)
Admission: RE | Admit: 2024-06-30 | Discharge: 2024-06-30 | Disposition: A | Discharge status: Home / Self Care | Source: Ambulatory Visit | Attending: Cardiology | Admitting: Cardiology

## 2024-06-30 DIAGNOSIS — I5022 Chronic systolic (congestive) heart failure: Secondary | ICD-10-CM | POA: Insufficient documentation

## 2024-06-30 LAB — ECHOCARDIOGRAM LIMITED
Area-P 1/2: 3.85 cm2
S' Lateral: 5 cm

## 2024-06-30 MED ORDER — PERFLUTREN LIPID MICROSPHERE
1.0000 mL | INTRAVENOUS | Status: AC | PRN
  Administered 2024-06-30: 2 mL via INTRAVENOUS

## 2024-07-01 ENCOUNTER — Ambulatory Visit

## 2024-07-01 ENCOUNTER — Other Ambulatory Visit: Payer: Self-pay

## 2024-07-01 VITALS — BP 110/66 | HR 53 | Temp 97.9°F | Ht 73.0 in | Wt 217.8 lb

## 2024-07-01 DIAGNOSIS — G479 Sleep disorder, unspecified: Secondary | ICD-10-CM

## 2024-07-01 DIAGNOSIS — F339 Major depressive disorder, recurrent, unspecified: Secondary | ICD-10-CM

## 2024-07-01 DIAGNOSIS — E538 Deficiency of other specified B group vitamins: Secondary | ICD-10-CM

## 2024-07-01 DIAGNOSIS — N1831 Chronic kidney disease, stage 3a: Secondary | ICD-10-CM | POA: Diagnosis not present

## 2024-07-01 DIAGNOSIS — I2510 Atherosclerotic heart disease of native coronary artery without angina pectoris: Secondary | ICD-10-CM | POA: Diagnosis not present

## 2024-07-01 DIAGNOSIS — E039 Hypothyroidism, unspecified: Secondary | ICD-10-CM

## 2024-07-01 DIAGNOSIS — K219 Gastro-esophageal reflux disease without esophagitis: Secondary | ICD-10-CM | POA: Diagnosis not present

## 2024-07-01 DIAGNOSIS — I10 Essential (primary) hypertension: Secondary | ICD-10-CM

## 2024-07-01 DIAGNOSIS — E559 Vitamin D deficiency, unspecified: Secondary | ICD-10-CM | POA: Diagnosis not present

## 2024-07-01 DIAGNOSIS — R7303 Prediabetes: Secondary | ICD-10-CM

## 2024-07-01 DIAGNOSIS — I255 Ischemic cardiomyopathy: Secondary | ICD-10-CM

## 2024-07-01 DIAGNOSIS — F39 Unspecified mood [affective] disorder: Secondary | ICD-10-CM

## 2024-07-01 MED ORDER — SERTRALINE HCL 100 MG PO TABS
100.0000 mg | ORAL_TABLET | Freq: Every day | ORAL | 3 refills | Status: AC
  Filled 2024-07-01 – 2024-07-30 (×2): qty 90, 90d supply, fill #0
  Filled 2024-10-28: qty 90, 90d supply, fill #1

## 2024-07-01 MED ORDER — PANTOPRAZOLE SODIUM 20 MG PO TBEC
20.0000 mg | DELAYED_RELEASE_TABLET | Freq: Two times a day (BID) | ORAL | 2 refills | Status: AC
  Filled 2024-07-01: qty 180, 90d supply, fill #0
  Filled 2024-09-29: qty 180, 90d supply, fill #1
  Filled 2024-12-21: qty 180, 90d supply, fill #2

## 2024-07-01 NOTE — Assessment & Plan Note (Signed)
 BP within goal today. Cardiac medication refill per cardiology.

## 2024-07-01 NOTE — Assessment & Plan Note (Signed)
 Taking prn Trazodone  with feeling groggy in the morning.  Using melatonin prn,  and CPAP for sleep apnea. Encourage healthy sleep hygiene. Schedule follow-up with sleep specialist to review CPAP settings.

## 2024-07-01 NOTE — Assessment & Plan Note (Signed)
 Check A1c.

## 2024-07-01 NOTE — Assessment & Plan Note (Addendum)
 Low B12 levels may contribute to fatigue, increased r/o anemia, may contribute to increased cardiac burden. Repeat B12 level, CBC. Consider B12 injections vs oral B 12 if levels remain low

## 2024-07-01 NOTE — Assessment & Plan Note (Signed)
 Low vitamin D  levels may affect overall health and energy. Order repeat vitamin D  level. Consider vitamin D  supplementation if levels remain low.

## 2024-07-01 NOTE — Assessment & Plan Note (Signed)
 Continue close f/u with cardiology.

## 2024-07-01 NOTE — Assessment & Plan Note (Signed)
>>  ASSESSMENT AND PLAN FOR SLEEP DISTURBANCE WRITTEN ON 07/01/2024  4:01 PM BY Barett Whidbee, MD  Taking prn Trazodone  with feeling groggy in the morning.  Using melatonin prn,  and CPAP for sleep apnea. Encourage healthy sleep hygiene. Schedule follow-up with sleep specialist to review CPAP settings.

## 2024-07-01 NOTE — Assessment & Plan Note (Signed)
 Continue f/u and management per cardiology.

## 2024-07-01 NOTE — Assessment & Plan Note (Signed)
 Compromised renal function likely due to medications and reduced cardiac output. Check CMP.  Avoid nephrotoxic medications. Consider referral to nephrologist if renal function declines further.

## 2024-07-01 NOTE — Progress Notes (Signed)
 Established Patient Office Visit TOC from Dr. Hope    Subjective  Patient ID: Bobby [name], male    DOB: 10-12-1971  Age: 53 y.o. MRN: [id number]  Chief Complaint  Patient presents with   Establish Care    He  has a past medical history of Acute MI anterior wall first episode care [facility]) (07/13/2017), Allergy (N/A), Anxiety (Early 20's), Arrhythmia, Cardiogenic shock (HCC) (07/13/2017), CHF (congestive heart failure) (HCC), Coronary artery disease, Depression (Early 20's), Elevated troponin (06/29/2020), Encounter for screening for HIV (05/27/2023), GERD (gastroesophageal reflux disease), Graves disease, Heart murmur (Early 1980's), Hypertension, MI (myocardial infarction) (HCC), Myalgia (05/21/2019), Nausea vomiting and diarrhea (06/29/2020), Sleep apnea, Thyroid  disease, and Wound infection after surgery (11/16/2023).  HPI Discussed the use of AI scribe software for clinical note transcription with the patient, who gave verbal consent to proceed. History of Present Illness Bobby [name] is a 53 year old male with coronary artery disease (s/p CABG 2024), CHFrEF (ischemic cardiomyopathy),  OSA, p atrial fibrillation, HTN, hyperlipidemia, depression, insomnia, ED wearing a lifeVest who presents to establish care and medication management. Has echocardiogram on 06/30/24, has upcoming appointment with cardiology later this month. He denies chest pain, palpitations, lower leg edema.   He is currently on several cardiac medications, including amiodarone , aspirin , lipitor, zetia , farxiga , digoxin , repatha , mexiletine, kclor, warfarin, metoprolol , torsemide .  He is also taking Sildenafil  prn for ED. His family history is significant for heart disease; his father had a myocardial infarction and underwent bypass surgery at 32, and his mother has valvular heart disease. He reports a hereditary predisposition to heart disease.  He has a history of acquired hypothyroidism having undergone radiation treatment,  and is on levothyroxine  175 mcg, which was recently increased due to amiodarone  affecting his thyroid  function. He is not seeing endocrinologist.   He experiences poor sleep and uses a CPAP machine nightly. He takes trazodone  occasionally for sleep but dislikes its side effects. He also takes melatonin to aid sleep. He feels constantly tired, affecting his ability to work regularly.  He has a history of low vitamin D  and B12 levels. He is not currently taking any vitamin D  or B12 supplements.  He has a history of low kidney function, not taking NSAIDs.    ROS As per HPI    Objective:     BP 110/66 (BP Location: Left Arm, Patient Position: Sitting, Cuff Size: Normal)   Pulse (!) 53   Temp 97.9 F (36.6 C) (Oral)   Ht 6' 1 (1.854 m)   Wt 217 lb 12.8 oz (98.8 kg)   SpO2 95%   BMI 28.74 kg/m      07/01/2024    3:06 PM 04/29/2024   12:57 PM 01/30/2024    8:36 AM  Depression screen PHQ 2/9  Decreased Interest 1 1 3   Down, Depressed, Hopeless 0 1 3  PHQ - 2 Score 1 2 6   Altered sleeping 3 1 3   Tired, decreased energy 2 1 3   Change in appetite 1 0 2  Feeling bad or failure about yourself  0 0 2  Trouble concentrating 2 1 3   Moving slowly or fidgety/restless 0 0 1  Suicidal thoughts 0 0 0  PHQ-9 Score 9 5 20   Difficult doing work/chores Very difficult Somewhat difficult Extremely dIfficult      07/01/2024    3:06 PM 04/29/2024   12:57 PM 01/30/2024    8:37 AM 01/09/2024    3:13 PM  GAD 7 : Generalized Anxiety Score  Nervous, Anxious, on Edge 2 1 3 3   Control/stop worrying 0 0 1 3  Worry too much - different things 0 1 2 3   Trouble relaxing 3 1 3 3   Restless 3 2 3 3   Easily annoyed or irritable 1 1 3 3   Afraid - awful might happen 0 0 2 2  Total GAD 7 Score 9 6 17 20   Anxiety Difficulty Somewhat difficult Somewhat difficult Extremely difficult Extremely difficult      07/01/2024    3:06 PM 04/29/2024   12:57 PM 01/30/2024    8:36 AM  Depression screen PHQ 2/9  Decreased  Interest 1 1 3   Down, Depressed, Hopeless 0 1 3  PHQ - 2 Score 1 2 6   Altered sleeping 3 1 3   Tired, decreased energy 2 1 3   Change in appetite 1 0 2  Feeling bad or failure about yourself  0 0 2  Trouble concentrating 2 1 3   Moving slowly or fidgety/restless 0 0 1  Suicidal thoughts 0 0 0  PHQ-9 Score 9 5 20   Difficult doing work/chores Very difficult Somewhat difficult Extremely dIfficult      07/01/2024    3:06 PM 04/29/2024   12:57 PM 01/30/2024    8:37 AM 01/09/2024    3:13 PM  GAD 7 : Generalized Anxiety Score  Nervous, Anxious, on Edge 2 1 3 3   Control/stop worrying 0 0 1 3  Worry too much - different things 0 1 2 3   Trouble relaxing 3 1 3 3   Restless 3 2 3 3   Easily annoyed or irritable 1 1 3 3   Afraid - awful might happen 0 0 2 2  Total GAD 7 Score 9 6 17 20   Anxiety Difficulty Somewhat difficult Somewhat difficult Extremely difficult Extremely difficult   SDOH Screenings   Food Insecurity: Patient Declined (07/01/2024)  Housing: Patient Declined (07/01/2024)  Transportation Needs: Patient Declined (07/01/2024)  Utilities: Not At Risk (06/22/2023)  Alcohol Screen: Low Risk  (07/01/2024)  Depression (PHQ2-9): Medium Risk (07/01/2024)  Financial Resource Strain: Low Risk  (07/01/2024)  Physical Activity: Sufficiently Active (07/01/2024)  Social Connections: Moderately Isolated (07/01/2024)  Stress: Stress Concern Present (07/01/2024)  Tobacco Use: Medium Risk (07/01/2024)     Physical Exam Constitutional:      General: He is not in acute distress.    Appearance: Normal appearance.     Comments: Has lifevest on.   HENT:     Head: Normocephalic and atraumatic.     Right Ear: Tympanic membrane normal.     Left Ear: Tympanic membrane normal.     Mouth/Throat:     Mouth: Mucous membranes are moist.  Neck:     Thyroid : No thyroid  mass or thyroid  tenderness.     Vascular: No carotid bruit.  Cardiovascular:     Rate and Rhythm: Normal rate and regular rhythm.  Pulmonary:     Effort:  Pulmonary effort is normal.     Breath sounds: Normal breath sounds. No wheezing.  Abdominal:     General: Bowel sounds are normal.     Palpations: Abdomen is soft.  Musculoskeletal:     Cervical back: Neck supple. No rigidity.     Right lower leg: No edema.     Left lower leg: No edema.  Skin:    General: Skin is warm.  Neurological:     Mental Status: He is alert and oriented to person, place, and time.  Psychiatric:  Mood and Affect: Mood normal.        Behavior: Behavior normal.        No results found for any visits on 07/01/24.  The ASCVD Risk score (Arnett DK, et al., 2019) failed to calculate for the following reasons:   Risk score cannot be calculated because patient has a medical history suggesting prior/existing ASCVD     Assessment & Plan:   B12 deficiency Assessment & Plan: Low B12 levels may contribute to fatigue, increased r/o anemia, may contribute to increased cardiac burden. Repeat B12 level, CBC. Consider B12 injections vs oral B 12 if levels remain low   Orders: -     CBC with Differential/Platelet -     Vitamin B12  Gastroesophageal reflux disease without esophagitis Assessment & Plan: Chronic.  Symptoms significantly improved since taking Protonix  20 mg BID form once daily. Refill sent. Follow-up with Dr. Unk as scheduled  Orders: -     Pantoprazole  Sodium; Take 1 tablet (20 mg total) by mouth 2 (two) times daily.  Dispense: 180 tablet; Refill: 2  Episode of recurrent major depressive disorder, unspecified depression episode severity (HCC) -     Sertraline  HCl; Take 1 tablet (100 mg total) by mouth daily.  Dispense: 90 tablet; Refill: 3  Mood disorder (HCC) Assessment & Plan: Continue Sertraline  100 mg daily, refill sent.   Orders: -     Sertraline  HCl; Take 1 tablet (100 mg total) by mouth daily.  Dispense: 90 tablet; Refill: 3  Stage 3a chronic kidney disease (HCC) Assessment & Plan: Compromised renal function likely due to  medications and reduced cardiac output. Check CMP.  Avoid nephrotoxic medications. Consider referral to nephrologist if renal function declines further.  Orders: -     Comprehensive metabolic panel with GFR  Acquired hypothyroidism -     TSH -     T4, free  Prediabetes Assessment & Plan: Check A1c  Orders: -     Hemoglobin A1c  Vitamin D  deficiency Assessment & Plan: Low vitamin D  levels may affect overall health and energy. Order repeat vitamin D  level. Consider vitamin D  supplementation if levels remain low.  Orders: -     VITAMIN D  25 Hydroxy (Vit-D Deficiency, Fractures)  Sleep disturbance Assessment & Plan: Taking prn Trazodone  with feeling groggy in the morning.  Using melatonin prn,  and CPAP for sleep apnea. Encourage healthy sleep hygiene. Schedule follow-up with sleep specialist to review CPAP settings.    Coronary artery disease involving native coronary artery of native heart, unspecified whether angina present Assessment & Plan: Continue f/u and management per cardiology.    Ischemic cardiomyopathy Assessment & Plan: Continue close f/u with cardiology.    Primary hypertension Assessment & Plan: BP within goal today. Cardiac medication refill per cardiology.    I spent 45 minutes on the day of this face-to-face encounter reviewing the patient's medical and surgical history, medications, ongoing concerns, and reviewing the assessment and plan with the patient. This time also included counseling the patient on their health conditions and management options. Additionally, I spent time post-visit ordering and reviewing diagnostics and therapeutics with the patient.   Return in about 3 months (around 10/01/2024) for Chronic follow up .   Luke Shade, MD

## 2024-07-01 NOTE — Assessment & Plan Note (Signed)
 Chronic.  Symptoms significantly improved since taking Protonix  20 mg BID form once daily. Refill sent. Follow-up with Dr. Unk as scheduled

## 2024-07-01 NOTE — Assessment & Plan Note (Signed)
 Continue Sertraline  100 mg daily, refill sent.

## 2024-07-02 ENCOUNTER — Ambulatory Visit: Payer: Self-pay

## 2024-07-02 DIAGNOSIS — E1169 Type 2 diabetes mellitus with other specified complication: Secondary | ICD-10-CM

## 2024-07-02 LAB — COMPREHENSIVE METABOLIC PANEL WITH GFR
ALT: 104 U/L — ABNORMAL HIGH (ref 0–53)
AST: 50 U/L — ABNORMAL HIGH (ref 0–37)
Albumin: 4.6 g/dL (ref 3.5–5.2)
Alkaline Phosphatase: 107 U/L (ref 39–117)
BUN: 21 mg/dL (ref 6–23)
CO2: 32 meq/L (ref 19–32)
Calcium: 9.1 mg/dL (ref 8.4–10.5)
Chloride: 101 meq/L (ref 96–112)
Creatinine, Ser: 1.63 mg/dL — ABNORMAL HIGH (ref 0.40–1.50)
GFR: 47.44 mL/min — ABNORMAL LOW (ref >60.00)
Glucose, Bld: 95 mg/dL (ref 70–99)
Potassium: 4.2 meq/L (ref 3.5–5.1)
Sodium: 142 meq/L (ref 135–145)
Total Bilirubin: 0.6 mg/dL (ref 0.2–1.2)
Total Protein: 7.1 g/dL (ref 6.0–8.3)

## 2024-07-02 LAB — CBC WITH DIFFERENTIAL/PLATELET
Basophils Absolute: 0.2 K/uL — ABNORMAL HIGH (ref 0.0–0.1)
Basophils Relative: 2.3 % (ref 0.0–3.0)
Eosinophils Absolute: 0.2 K/uL (ref 0.0–0.7)
Eosinophils Relative: 1.9 % (ref 0.0–5.0)
HCT: 38.7 % — ABNORMAL LOW (ref 39.0–52.0)
Hemoglobin: 12.4 g/dL — ABNORMAL LOW (ref 13.0–17.0)
Lymphocytes Relative: 19.4 % (ref 12.0–46.0)
Lymphs Abs: 1.7 K/uL (ref 0.7–4.0)
MCHC: 31.9 g/dL (ref 30.0–36.0)
MCV: 81.4 fl (ref 78.0–100.0)
Monocytes Absolute: 0.8 K/uL (ref 0.1–1.0)
Monocytes Relative: 8.7 % (ref 3.0–12.0)
Neutro Abs: 6 K/uL (ref 1.4–7.7)
Neutrophils Relative %: 67.7 % (ref 43.0–77.0)
Platelets: 262 K/uL (ref 150.0–400.0)
RBC: 4.76 Mil/uL (ref 4.22–5.81)
RDW: 17.6 % — ABNORMAL HIGH (ref 11.5–15.5)
WBC: 8.9 K/uL (ref 4.0–10.5)

## 2024-07-02 LAB — T4, FREE: Free T4: 1.28 ng/dL (ref 0.60–1.60)

## 2024-07-02 LAB — VITAMIN B12: Vitamin B-12: 431 pg/mL (ref 211–911)

## 2024-07-02 LAB — VITAMIN D 25 HYDROXY (VIT D DEFICIENCY, FRACTURES): VITD: 34.1 ng/mL (ref 30.00–100.00)

## 2024-07-02 LAB — HEMOGLOBIN A1C: Hgb A1c MFr Bld: 6.9 % — ABNORMAL HIGH (ref 4.6–6.5)

## 2024-07-02 LAB — TSH: TSH: 2.62 u[IU]/mL (ref 0.35–5.50)

## 2024-07-03 ENCOUNTER — Other Ambulatory Visit: Payer: Self-pay

## 2024-07-03 DIAGNOSIS — E119 Type 2 diabetes mellitus without complications: Secondary | ICD-10-CM | POA: Insufficient documentation

## 2024-07-03 MED ORDER — METFORMIN HCL ER 500 MG PO TB24
500.0000 mg | ORAL_TABLET | Freq: Every day | ORAL | 1 refills | Status: AC
  Filled 2024-07-03: qty 90, 90d supply, fill #0
  Filled 2024-09-29: qty 90, 90d supply, fill #1

## 2024-07-04 ENCOUNTER — Telehealth: Payer: Self-pay | Admitting: Cardiovascular Disease

## 2024-07-04 NOTE — Telephone Encounter (Signed)
 Left message for patient to call back

## 2024-07-04 NOTE — Telephone Encounter (Signed)
 Wife is calling to see what next for patient after having his echo done. Please advise

## 2024-07-04 NOTE — Telephone Encounter (Signed)
 Spoke with the patient's wife and advised that Dr. Cindie is out of the office right now so has not had a chance to review his echocardiogram. Advised that it did show EF was at 25-30% and per last notes this means ICD is probably the next step. Advised that once Dr. Cindie reviews the echo we will give them a call.

## 2024-07-04 NOTE — Telephone Encounter (Signed)
 Pt's wife was returning nurse call and is requesting a callback. Please advise

## 2024-07-05 ENCOUNTER — Ambulatory Visit: Payer: Self-pay | Admitting: Cardiology

## 2024-07-08 ENCOUNTER — Telehealth: Payer: Self-pay | Admitting: Cardiovascular Disease

## 2024-07-08 ENCOUNTER — Other Ambulatory Visit: Payer: Self-pay | Admitting: *Deleted

## 2024-07-08 DIAGNOSIS — I5022 Chronic systolic (congestive) heart failure: Secondary | ICD-10-CM

## 2024-07-08 NOTE — Telephone Encounter (Signed)
 Pt wife called in with some questions about the defi and how how the process is going to work.  She would like a nurse to call her at [phone]

## 2024-07-08 NOTE — Telephone Encounter (Addendum)
 Returned call to pt's wife, Delon; pt is going to be scheduled for a dual chamber defibrillator and both pt and his wife were wanting to know an approximate date so that they can take care of some things prior to pt going out of work.  I explained to her the Doreatha RN is on vacation and I did not see any current orders for a defibrillator or future procedures scheduled under pt's appointments, but that I would reach out to the nurse who is covering Viola absence to see if they had any additional information.  Delon stated that they could either call her or Oluwaseyi directly.  She stated that she appreciated the call back

## 2024-07-09 ENCOUNTER — Ambulatory Visit: Attending: Cardiovascular Disease

## 2024-07-09 DIAGNOSIS — Z5181 Encounter for therapeutic drug level monitoring: Secondary | ICD-10-CM | POA: Diagnosis not present

## 2024-07-09 DIAGNOSIS — I480 Paroxysmal atrial fibrillation: Secondary | ICD-10-CM | POA: Diagnosis not present

## 2024-07-09 DIAGNOSIS — I255 Ischemic cardiomyopathy: Secondary | ICD-10-CM

## 2024-07-09 DIAGNOSIS — I5042 Chronic combined systolic (congestive) and diastolic (congestive) heart failure: Secondary | ICD-10-CM

## 2024-07-09 DIAGNOSIS — I5020 Unspecified systolic (congestive) heart failure: Secondary | ICD-10-CM

## 2024-07-09 DIAGNOSIS — I493 Ventricular premature depolarization: Secondary | ICD-10-CM

## 2024-07-09 DIAGNOSIS — I25110 Atherosclerotic heart disease of native coronary artery with unstable angina pectoris: Secondary | ICD-10-CM

## 2024-07-09 DIAGNOSIS — Z951 Presence of aortocoronary bypass graft: Secondary | ICD-10-CM

## 2024-07-09 LAB — POCT INR: INR: 2.1 (ref 2.0–3.0)

## 2024-07-09 NOTE — Telephone Encounter (Signed)
 Left detailed message informing wife that I reviewed with Dr. Cindie this afternoon -- ok to proceed with ICD implant.  Aware I am forwarding this message to the procedure scheduler who will reach out to arrange procedure.

## 2024-07-09 NOTE — Patient Instructions (Signed)
 Increase to 2.5 mg  DAILY, EXCEPT 5 mg EVERY MONDAY, WEDNESDAY and FRIDAY.  INR in 6 weeks.  [phone]

## 2024-07-10 ENCOUNTER — Telehealth: Payer: Self-pay | Admitting: *Deleted

## 2024-07-10 NOTE — Telephone Encounter (Signed)
 Called pt and scheduled appointment for INR check on 07/23/2024  INR/ICD Implant Received: Today Bobby [name], April, CMA  P Cv Div Anticoagulation Pt is scheduled for ICD Implant with Dr. Cindie on 8/6.   He will need to have INR checked week before procedure so we can manage his Coumadin  prior to procedure.  Pt gets this done in [place] - he will have his pre-procedure labs done same day.  Thanks, April

## 2024-07-10 NOTE — Telephone Encounter (Signed)
 Pt is scheduled for ICD Implant with Dr. Cindie on 8/6 at 3:30 pm.   He will need to have INR done week before procedure so we can manage his Coumadin  prior to procedure. I will message Coumadin  clinic to arrange this. Pt will have labs done same day he comes in for INR check. He will also pick up surgical scrub that day. He goes to [facility] for this.   Pt advised to hold ASA x 5 days per CL.   Instruction letter will be sent via MyChart.

## 2024-07-10 NOTE — Addendum Note (Signed)
 Addended by: Kaysey Berndt on: 07/10/2024 12:46 PM   Modules accepted: Orders

## 2024-07-14 MED FILL — Metoprolol Succinate Tab ER 24HR 25 MG (Tartrate Equiv): ORAL | 90 days supply | Qty: 90 | Fill #2 | Status: AC

## 2024-07-18 ENCOUNTER — Other Ambulatory Visit: Payer: Self-pay | Admitting: Cardiology

## 2024-07-18 ENCOUNTER — Other Ambulatory Visit: Payer: Self-pay

## 2024-07-18 DIAGNOSIS — E039 Hypothyroidism, unspecified: Secondary | ICD-10-CM

## 2024-07-18 MED FILL — Levothyroxine Sodium Tab 175 MCG: ORAL | 60 days supply | Qty: 60 | Fill #0 | Status: AC

## 2024-07-23 ENCOUNTER — Ambulatory Visit: Attending: Cardiovascular Disease

## 2024-07-23 ENCOUNTER — Telehealth: Payer: Self-pay

## 2024-07-23 ENCOUNTER — Telehealth (HOSPITAL_COMMUNITY): Payer: Self-pay

## 2024-07-23 DIAGNOSIS — Z5181 Encounter for therapeutic drug level monitoring: Secondary | ICD-10-CM

## 2024-07-23 DIAGNOSIS — I493 Ventricular premature depolarization: Secondary | ICD-10-CM

## 2024-07-23 DIAGNOSIS — I25110 Atherosclerotic heart disease of native coronary artery with unstable angina pectoris: Secondary | ICD-10-CM

## 2024-07-23 DIAGNOSIS — I255 Ischemic cardiomyopathy: Secondary | ICD-10-CM | POA: Diagnosis not present

## 2024-07-23 DIAGNOSIS — Z951 Presence of aortocoronary bypass graft: Secondary | ICD-10-CM

## 2024-07-23 DIAGNOSIS — I5020 Unspecified systolic (congestive) heart failure: Secondary | ICD-10-CM

## 2024-07-23 DIAGNOSIS — I5022 Chronic systolic (congestive) heart failure: Secondary | ICD-10-CM | POA: Diagnosis not present

## 2024-07-23 DIAGNOSIS — I5042 Chronic combined systolic (congestive) and diastolic (congestive) heart failure: Secondary | ICD-10-CM

## 2024-07-23 DIAGNOSIS — I480 Paroxysmal atrial fibrillation: Secondary | ICD-10-CM | POA: Diagnosis not present

## 2024-07-23 LAB — POCT INR: INR: 2.4 (ref 2.0–3.0)

## 2024-07-23 NOTE — Patient Instructions (Signed)
 Continue 2.5 mg  DAILY, EXCEPT 5 mg EVERY MONDAY, WEDNESDAY and FRIDAY.  INR in 2 weeks. ICD Implant 8/6 [phone]

## 2024-07-23 NOTE — Telephone Encounter (Signed)
 I spoke to the patient after finding out that Dr Cindie would like Goal closer to 1.5-2.0 upon ICD placement 8/6.  I told him to take 0.5 tablet Friday and Monday since his INR was 2.4 today.  He verbalized understanding.

## 2024-07-23 NOTE — Telephone Encounter (Signed)
 Patient returned call to discuss upcoming procedure.   Confirmed patient is scheduled for a Implantable cardioverter defibrilator (ICD) on Wednesday, August 6 with Dr. Ole Holts. Instructed patient to arrive at the Main Entrance A at [facility]: [address] [place], [place] [address] and check in at Admitting at 1:30 PM.   Labs completed  Any recent signs of acute illness or been started on antibiotics? No Any new medications started? No Any medications to hold? Hold Aspirin  for 5 days prior to the procedure- last dose on July 31. Hold Farxiga  for 3 days prior to the procedure- last dose on August 02. Hold Metformin  and Torsemide  on the morning of your procedure. Follow the instructions on Warfarin provided by the Coumadin  clinic.  Medication instructions:  On the morning of your procedure you may take morning medications not discussed with a sip of water.  You may have a LIGHT breakfast prior to 7:00 am on the morning of your procedure. No eating or drinking after 7:00 am prior to procedure.   The night before your procedure and the morning of your procedure, wash thoroughly with the CHG surgical soap from the neck down, paying special attention to the area where your procedure will be performed.  Advised of plan to stay overnight at the hospital. You MUST have a responsible adult to drive you home upon discharge.   Patient verbalized understanding to all instructions provided and agreed to proceed with procedure.

## 2024-07-23 NOTE — Telephone Encounter (Signed)
 Attempted to reach patient to discuss upcoming procedure, no answer. Left VM for patient to return call.

## 2024-07-24 LAB — CBC
Hematocrit: 37.8 % (ref 37.5–51.0)
Hemoglobin: 11.5 g/dL — ABNORMAL LOW (ref 13.0–17.7)
MCH: 25.7 pg — ABNORMAL LOW (ref 26.6–33.0)
MCHC: 30.4 g/dL — ABNORMAL LOW (ref 31.5–35.7)
MCV: 84 fL (ref 79–97)
Platelets: 275 x10E3/uL (ref 150–450)
RBC: 4.48 x10E6/uL (ref 4.14–5.80)
RDW: 15.8 % — ABNORMAL HIGH (ref 11.6–15.4)
WBC: 9.8 x10E3/uL (ref 3.4–10.8)

## 2024-07-24 LAB — BASIC METABOLIC PANEL WITH GFR
BUN/Creatinine Ratio: 15 (ref 9–20)
BUN: 25 mg/dL — AB (ref 6–24)
CO2: 21 mmol/L (ref 20–29)
Calcium: 9.1 mg/dL (ref 8.7–10.2)
Chloride: 99 mmol/L (ref 96–106)
Creatinine, Ser: 1.64 mg/dL — AB (ref 0.76–1.27)
Glucose: 119 mg/dL — AB (ref 70–99)
Potassium: 4 mmol/L (ref 3.5–5.2)
Sodium: 141 mmol/L (ref 134–144)
eGFR: 49 mL/min/1.73 — AB (ref >59)

## 2024-07-25 ENCOUNTER — Encounter: Payer: Self-pay | Admitting: Emergency Medicine

## 2024-07-29 NOTE — Pre-Procedure Instructions (Signed)
 Instructed patient on the following items: Arrival time 1300 Nothing to eat or drink after midnight No meds AM of procedure Responsible person to drive you home and stay with you for 24 hrs Wash with special soap night before and morning of procedure If on anti-coagulant drug instructions Coumadin - managed per coumadin  clinic

## 2024-07-30 ENCOUNTER — Ambulatory Visit (HOSPITAL_COMMUNITY)
Admission: RE | Admit: 2024-07-30 | Discharge: 2024-07-31 | Disposition: A | Discharge status: Home / Self Care | Attending: Cardiology | Admitting: Cardiology

## 2024-07-30 ENCOUNTER — Other Ambulatory Visit: Payer: Self-pay

## 2024-07-30 ENCOUNTER — Encounter (HOSPITAL_COMMUNITY)
Admission: RE | Disposition: A | Discharge status: Home / Self Care | Payer: Self-pay | Source: Home / Self Care | Attending: Cardiology

## 2024-07-30 ENCOUNTER — Encounter (HOSPITAL_COMMUNITY): Payer: Self-pay | Admitting: Cardiology

## 2024-07-30 DIAGNOSIS — Z7901 Long term (current) use of anticoagulants: Secondary | ICD-10-CM | POA: Diagnosis not present

## 2024-07-30 DIAGNOSIS — I5022 Chronic systolic (congestive) heart failure: Secondary | ICD-10-CM

## 2024-07-30 DIAGNOSIS — Z95810 Presence of automatic (implantable) cardiac defibrillator: Secondary | ICD-10-CM | POA: Diagnosis present

## 2024-07-30 DIAGNOSIS — I2510 Atherosclerotic heart disease of native coronary artery without angina pectoris: Secondary | ICD-10-CM | POA: Insufficient documentation

## 2024-07-30 DIAGNOSIS — I255 Ischemic cardiomyopathy: Principal | ICD-10-CM

## 2024-07-30 DIAGNOSIS — I480 Paroxysmal atrial fibrillation: Secondary | ICD-10-CM

## 2024-07-30 DIAGNOSIS — I110 Hypertensive heart disease with heart failure: Principal | ICD-10-CM | POA: Insufficient documentation

## 2024-07-30 DIAGNOSIS — Z951 Presence of aortocoronary bypass graft: Secondary | ICD-10-CM | POA: Insufficient documentation

## 2024-07-30 DIAGNOSIS — Z7982 Long term (current) use of aspirin: Secondary | ICD-10-CM | POA: Insufficient documentation

## 2024-07-30 HISTORY — DX: Presence of automatic (implantable) cardiac defibrillator: Z95.810

## 2024-07-30 LAB — GLUCOSE, CAPILLARY: Glucose-Capillary: 118 mg/dL — ABNORMAL HIGH (ref 70–99)

## 2024-07-30 LAB — PROTIME-INR
INR: 1.6 — ABNORMAL HIGH (ref 0.8–1.2)
Prothrombin Time: 20.2 s — ABNORMAL HIGH (ref 11.4–15.2)

## 2024-07-30 SURGERY — ICD IMPLANT

## 2024-07-30 MED ORDER — METOPROLOL SUCCINATE ER 25 MG PO TB24
25.0000 mg | ORAL_TABLET | Freq: Every day | ORAL | Status: DC
  Administered 2024-07-30: 25 mg via ORAL
  Filled 2024-07-30: qty 1

## 2024-07-30 MED ORDER — TRAZODONE HCL 100 MG PO TABS
100.0000 mg | ORAL_TABLET | Freq: Every evening | ORAL | Status: DC | PRN
  Administered 2024-07-30: 100 mg via ORAL
  Filled 2024-07-30: qty 1

## 2024-07-30 MED ORDER — FENTANYL CITRATE (PF) 100 MCG/2ML IJ SOLN
INTRAMUSCULAR | Status: AC
  Filled 2024-07-30: qty 2

## 2024-07-30 MED ORDER — MEXILETINE HCL 150 MG PO CAPS
150.0000 mg | ORAL_CAPSULE | Freq: Two times a day (BID) | ORAL | Status: DC
  Administered 2024-07-30 – 2024-07-31 (×2): 150 mg via ORAL
  Filled 2024-07-30 (×2): qty 1

## 2024-07-30 MED ORDER — ATORVASTATIN CALCIUM 10 MG PO TABS
20.0000 mg | ORAL_TABLET | Freq: Every day | ORAL | Status: DC
  Administered 2024-07-31: 20 mg via ORAL
  Filled 2024-07-30: qty 2

## 2024-07-30 MED ORDER — MIDAZOLAM HCL 2 MG/2ML IJ SOLN
INTRAMUSCULAR | Status: AC
  Filled 2024-07-30: qty 2

## 2024-07-30 MED ORDER — ONDANSETRON HCL 4 MG/2ML IJ SOLN: 4.0000 mg | Freq: Four times a day (QID) | INTRAMUSCULAR | Status: DC | PRN

## 2024-07-30 MED ORDER — FENTANYL CITRATE (PF) 100 MCG/2ML IJ SOLN
INTRAMUSCULAR | Status: DC | PRN
  Administered 2024-07-30 (×2): 25 ug via INTRAVENOUS

## 2024-07-30 MED ORDER — PANTOPRAZOLE SODIUM 20 MG PO TBEC
20.0000 mg | DELAYED_RELEASE_TABLET | Freq: Two times a day (BID) | ORAL | Status: DC
  Administered 2024-07-30 – 2024-07-31 (×2): 20 mg via ORAL
  Filled 2024-07-30 (×2): qty 1

## 2024-07-30 MED ORDER — LIDOCAINE HCL (PF) 1 % IJ SOLN
INTRAMUSCULAR | Status: AC
  Filled 2024-07-30: qty 60

## 2024-07-30 MED ORDER — SERTRALINE HCL 100 MG PO TABS
100.0000 mg | ORAL_TABLET | Freq: Every day | ORAL | Status: DC
  Administered 2024-07-31: 100 mg via ORAL
  Filled 2024-07-30: qty 1

## 2024-07-30 MED ORDER — SODIUM CHLORIDE 0.9 % IV SOLN
INTRAVENOUS | Status: AC
  Filled 2024-07-30: qty 2

## 2024-07-30 MED ORDER — CHLORHEXIDINE GLUCONATE 4 % EX SOLN
4.0000 | Freq: Once | CUTANEOUS | Status: AC
  Administered 2024-07-30: 4 via TOPICAL
  Filled 2024-07-30: qty 60

## 2024-07-30 MED ORDER — SODIUM CHLORIDE 0.9 % IV SOLN
INTRAVENOUS | Status: DC
Start: 2024-07-30 — End: 2024-07-30

## 2024-07-30 MED ORDER — CEFAZOLIN SODIUM-DEXTROSE 2-4 GM/100ML-% IV SOLN
INTRAVENOUS | Status: AC
  Filled 2024-07-30: qty 100

## 2024-07-30 MED ORDER — ACETAMINOPHEN 325 MG PO TABS
325.0000 mg | ORAL_TABLET | ORAL | Status: DC | PRN
  Administered 2024-07-30 – 2024-07-31 (×3): 650 mg via ORAL
  Filled 2024-07-30 (×3): qty 2

## 2024-07-30 MED ORDER — EZETIMIBE 10 MG PO TABS
10.0000 mg | ORAL_TABLET | Freq: Every day | ORAL | Status: DC
  Administered 2024-07-31: 10 mg via ORAL
  Filled 2024-07-30: qty 1

## 2024-07-30 MED ORDER — AMIODARONE HCL 200 MG PO TABS
300.0000 mg | ORAL_TABLET | Freq: Every day | ORAL | Status: DC
  Administered 2024-07-31: 300 mg via ORAL
  Filled 2024-07-30: qty 1

## 2024-07-30 MED ORDER — CEFAZOLIN SODIUM-DEXTROSE 2-4 GM/100ML-% IV SOLN
2.0000 g | INTRAVENOUS | Status: AC
  Administered 2024-07-30: 2 g via INTRAVENOUS

## 2024-07-30 MED ORDER — POTASSIUM CHLORIDE CRYS ER 20 MEQ PO TBCR
20.0000 meq | EXTENDED_RELEASE_TABLET | Freq: Every day | ORAL | Status: DC
  Administered 2024-07-31: 20 meq via ORAL
  Filled 2024-07-30: qty 1

## 2024-07-30 MED ORDER — MIDAZOLAM HCL 5 MG/5ML IJ SOLN
INTRAMUSCULAR | Status: DC | PRN
  Administered 2024-07-30 (×2): 1 mg via INTRAVENOUS

## 2024-07-30 MED ORDER — LIDOCAINE HCL (PF) 1 % IJ SOLN
INTRAMUSCULAR | Status: DC | PRN
  Administered 2024-07-30: 60 mL

## 2024-07-30 MED ORDER — HEPARIN (PORCINE) IN NACL 1000-0.9 UT/500ML-% IV SOLN
INTRAVENOUS | Status: DC | PRN
  Administered 2024-07-30: 500 mL

## 2024-07-30 MED ORDER — TORSEMIDE 20 MG PO TABS
40.0000 mg | ORAL_TABLET | Freq: Every day | ORAL | Status: DC
  Administered 2024-07-31: 40 mg via ORAL
  Filled 2024-07-30: qty 2

## 2024-07-30 MED ORDER — DIGOXIN 125 MCG PO TABS
0.1250 mg | ORAL_TABLET | Freq: Every day | ORAL | Status: DC
  Administered 2024-07-31: 0.125 mg via ORAL
  Filled 2024-07-30: qty 1

## 2024-07-30 MED ORDER — POVIDONE-IODINE 10 % EX SWAB
2.0000 | Freq: Once | CUTANEOUS | Status: AC
  Administered 2024-07-30: 2 via TOPICAL

## 2024-07-30 MED ORDER — LEVOTHYROXINE SODIUM 75 MCG PO TABS
175.0000 ug | ORAL_TABLET | Freq: Every day | ORAL | Status: DC
  Administered 2024-07-31: 175 ug via ORAL
  Filled 2024-07-30: qty 1

## 2024-07-30 MED ORDER — SODIUM CHLORIDE 0.9 % IV SOLN
80.0000 mg | INTRAVENOUS | Status: AC
  Administered 2024-07-30: 80 mg

## 2024-07-30 SURGICAL SUPPLY — 8 items
CABLE SURGICAL S-101-97-12 (CABLE) ×1 IMPLANT
ICD VIGILANT DR D233 (Pacemaker) IMPLANT
LEAD INGEVITY 7841 52 (Lead) IMPLANT
LEAD RELIANCE 0673 IMPLANT
PAD DEFIB RADIO PHYSIO CONN (PAD) ×1 IMPLANT
SHEATH 7FR PRELUDE SNAP 13 (SHEATH) IMPLANT
SHEATH 8FR PRELUDE SNAP 13 (SHEATH) IMPLANT
TRAY PACEMAKER INSERTION (PACKS) ×1 IMPLANT

## 2024-07-30 NOTE — H&P (Signed)
 Electrophysiology Office Follow up Visit Note:     Date:  07/30/2024    ID:  Bobby [name], DOB 10-Sep-1971, MRN [id number]   PCP:  Hope Merle, MD        [facility] Cardiologist:  Evalene Lunger, MD  [facility] Electrophysiologist:  Elspeth Sage, MD      Interval History:       Bobby [name] is a 53 y.o. male who presents for a follow up visit.    Bobby [name] is a 53 year old man who I am seeing today to discuss ICD therapy.  He has previously been followed by Dr. Sage and follows with Dr. Cherrie in the heart failure clinic.  He has a history of coronary artery disease with prior CABG in 2024, ischemic cardiomyopathy complicated by an LV apical thrombus, depression, hypothyroidism, hypertension, sleep apnea, paroxysmal atrial fibrillation.  He last saw Dr. Cherrie April 15, 2024.  He was wearing a LifeVest at that appointment.  A March cardiac MRI showed an ejection fraction of 31% with significant LGE.   He is with his wife today in clinic.  He feels palpitations but these have improved some from the period of time immediately after his bypass.  He still feels intermittent burning on his sternum where it is still healing.  Presents for ICD implant. Procedure reviewed.    Objective Past medical, surgical, social and family history were reviewed.   ROS:   Please see the history of present illness.    All other systems reviewed and are negative.   EKGs/Labs/Other Studies Reviewed:     The following studies were reviewed today:   April 16, 2024 ZIO monitor No atrial fibrillation, frequent ventricular ectopy 6.3%   March 06, 2024 cardiac MRI EF 31% Dilated LV LGE, transmural, LV mid to apical anterior and anteroseptal walls Small apical thrombus   December 20, 2023 echo EF 25 to 30% Chronic and acute mural thrombus Mildly reduced RV function Moderately dilated left atrium   April 15, 2024 EKG shows sinus rhythm, nonspecific interventricular conduction delay, left  axis deviation   EKG Interpretation Date/Time:                  Wednesday Apr 30 2024 09:58:42 EDT Ventricular Rate:         60 PR Interval:                 190 QRS Duration:             112 QT Interval:                 452 QTC Calculation:452 R Axis:                         -45   Text Interpretation:Sinus rhythm with occasional Premature ventricular complexes Left axis deviation Incomplete left bundle branch block Confirmed by Cindie Smalls [phone]) on 04/30/2024 10:02:01 AM     Physical Exam:     VS:  BP 123/86 (BP Location: Left Arm, Patient Position: Sitting, Cuff Size: Large)   Pulse 55   Ht 6' 1 (1.854 m)   Wt 223 lb (101.2 kg)   SpO2 95%   BMI 29.42 kg/m         Wt Readings from Last 3 Encounters:  04/30/24 223 lb (101.2 kg)  04/29/24 225 lb 2 oz (102.1 kg)  04/15/24 220 lb 12.8 oz (100.2 kg)      GEN: no distress.  LifeVest in place CARD: [id number], No MRG RESP: No IWOB. CTAB.     Assessment ASSESSMENT:     1. Chronic HFrEF (heart failure with reduced ejection fraction) (HCC)   2. Ischemic cardiomyopathy     PLAN:     In order of problems listed above:   #Chronic systolic heart failure #Coronary artery disease #Ischemic cardiomyopathy NYHA class II.  Warm and dry on exam.  EF 31%.  Significant LGE on cardiac MRI.  We discussed his risk for sudden cardiac death during today's clinic appointment.  We discussed the role of defibrillator therapy and reducing his risk for sudden cardiac death.  No plans for defibrillation threshold testing given history of mural thrombus.   He will need a dual-chamber ICD.  Plan for [employer] device.   Prior to ICD implant, need to confirm resolution of thrombus. If no thrombus on repeat TTE, plan to schedule ICD implant.   The patient has an ischemic CM (EF 31%), NYHA Class III CHF, and CAD.  He is referred by Dr Bensimhon for risk stratification of sudden death and consideration of ICD implantation.  At this time, he  meets criteria for ICD implantation for primary prevention of sudden death.  I have had a thorough discussion with the patient reviewing options.  The patient and their family (if available) have had opportunities to ask questions and have them answered. The patient and I have decided together through a shared decision making process to proceed with ICD implant at this time.     Risks, benefits, alternatives to ICD implantation were discussed in detail with the patient today. The patient understands that the risks include but are not limited to bleeding, infection, pneumothorax, perforation, tamponade, vascular damage, renal failure, MI, stroke, death, inappropriate shocks, and lead dislodgement and wishes to proceed.  We will therefore schedule device implantation at the next available time.   Continue Coumadin  around the time of his procedure given LV thrombus on prior imaging   Hold aspirin  for 5 days prior to the procedure.  Pros and cons of holding aspirin  were discussed in detail the patient during today's clinic appointment.   #Atrial fibrillation Coumadin  as above.  Plan for dual-chamber device   Presents for ICD implant. Procedure reviewed.  Signed, Ole Holts, MD, [facility], [facility] 07/30/2024 Electrophysiology [facility]

## 2024-07-31 ENCOUNTER — Other Ambulatory Visit: Payer: Self-pay

## 2024-07-31 ENCOUNTER — Ambulatory Visit (HOSPITAL_COMMUNITY)

## 2024-07-31 ENCOUNTER — Encounter (HOSPITAL_COMMUNITY): Payer: Self-pay | Admitting: Cardiology

## 2024-07-31 DIAGNOSIS — Z951 Presence of aortocoronary bypass graft: Secondary | ICD-10-CM | POA: Diagnosis not present

## 2024-07-31 DIAGNOSIS — Z7982 Long term (current) use of aspirin: Secondary | ICD-10-CM | POA: Diagnosis not present

## 2024-07-31 DIAGNOSIS — I5022 Chronic systolic (congestive) heart failure: Secondary | ICD-10-CM | POA: Diagnosis not present

## 2024-07-31 DIAGNOSIS — R918 Other nonspecific abnormal finding of lung field: Secondary | ICD-10-CM | POA: Diagnosis not present

## 2024-07-31 DIAGNOSIS — I110 Hypertensive heart disease with heart failure: Secondary | ICD-10-CM | POA: Diagnosis not present

## 2024-07-31 DIAGNOSIS — I255 Ischemic cardiomyopathy: Secondary | ICD-10-CM | POA: Diagnosis not present

## 2024-07-31 DIAGNOSIS — Z95810 Presence of automatic (implantable) cardiac defibrillator: Secondary | ICD-10-CM | POA: Diagnosis not present

## 2024-07-31 DIAGNOSIS — Z7901 Long term (current) use of anticoagulants: Secondary | ICD-10-CM | POA: Diagnosis not present

## 2024-07-31 DIAGNOSIS — I2510 Atherosclerotic heart disease of native coronary artery without angina pectoris: Secondary | ICD-10-CM | POA: Diagnosis not present

## 2024-07-31 MED ORDER — WARFARIN SODIUM 5 MG PO TABS: 2.5000 mg | ORAL_TABLET | Freq: Every day | ORAL | Status: DC

## 2024-07-31 MED ORDER — ACETAMINOPHEN 325 MG PO TABS: 325.0000 mg | ORAL_TABLET | ORAL | Status: AC | PRN

## 2024-07-31 MED FILL — Midazolam HCl Inj 2 MG/2ML (Base Equivalent): INTRAMUSCULAR | Qty: 2 | Status: AC

## 2024-07-31 NOTE — Discharge Summary (Signed)
 ELECTROPHYSIOLOGY PROCEDURE DISCHARGE SUMMARY    Patient ID: Bobby [name],  MRN: [id number], DOB/AGE: 55/55/1972 55 y.o.  Admit date: 07/30/2024 Discharge date: 07/31/2024  Primary Care Physician: Bair, Kalpana, MD  Primary Cardiologist: Evalene Lunger, MD  Electrophysiologist: Dr. Cindie    Primary Diagnosis:  Chronic systolic CHF    No Known Allergies   Procedures This Admission:  1.  Implantation of a [employer] dual chamber ICD on 07/30/2024 by Dr. Cindie with details as per op note.  2.  CXR on 07/31/24 demonstrated stable lead placement and no pneumothorax status post device implantation.      Brief HPI: Bobby [name] is a 53 y.o. male was referred to electrophysiology in the outpatient setting  for consideration of ICD implantation.  Past medical history includes above.  The patient has persistent LV dysfunction despite guideline directed therapy.  Risks, benefits, and alternatives to ICD implantation were reviewed with the patient who wished to proceed.   Hospital Course:  The patient was admitted and underwent implantation of a [employer] dual chamber ICD with details as outlined above. They were monitored on telemetry overnight which demonstrated NSR .  Left chest was without hematoma or ecchymosis.  The device was interrogated and found to be functioning normally.  CXR was obtained and demonstrated no pneumothorax status post device implantation..  Wound care, arm mobility, and restrictions were reviewed with the patient.  The patient was examined and considered stable for discharge to home.   Site appeared stable and Dr. Cindie was agreeable to discontinuation of the pressure bandage.  The patient's discharge medications include beta blocker (Toprol ). ARB previously dtopped due to hypotension.   Anticoagulation resumption Patient will resume coumadin  at half dose tonight and continue until check next week.  Physical Exam: Vitals:   07/30/24 2139  07/31/24 0027 07/31/24 0401 07/31/24 0840  BP: 104/60 (!) 98/58 102/60 118/72  Pulse: 60 63 (!) 54   Resp:  17 16 15   Temp:  98.1 F (36.7 C) 97.7 F (36.5 C) 97.7 F (36.5 C)  TempSrc:  Oral Oral Oral  SpO2:  96% 96% 98%  Weight:   98.8 kg   Height:        GEN- NAD. A&O x 3.  HEENT: Normocephalic, atraumatic Lungs- CTAB, normal effort.  Heart- RRR. No M/G/R.  GI- Soft, NT, ND.  Extremities- No clubbing, cyanosis, or edema Skin- Warm and dry, no rash or lesion. ICD site stable  Discharge Medications:  Allergies as of 07/31/2024   No Known Allergies      Medication List     PAUSE taking these medications    aspirin  81 MG chewable tablet Wait to take this until: August 04, 2024 Chew 1 tablet (81 mg total) by mouth daily.       TAKE these medications    acetaminophen  325 MG tablet Commonly known as: TYLENOL  Take 1-2 tablets (325-650 mg total) by mouth every 4 (four) hours as needed for mild pain (pain score 1-3).   amiodarone  200 MG tablet Commonly known as: PACERONE  Take 2 tablets (400 mg total) by mouth 2 (two) times daily for 14 days, THEN 1.5 tablets (300 mg total) daily. Start taking on: March 10, 2024 What changed: See the new instructions.   atorvastatin  20 MG tablet Commonly known as: LIPITOR Take 1 tablet (20 mg total) by mouth daily.   digoxin  0.125 MG tablet Commonly known as: LANOXIN  Take 1 tablet (0.125 mg total) by mouth daily.  ezetimibe  10 MG tablet Commonly known as: ZETIA  Take 1 tablet (10 mg total) by mouth daily.   Farxiga  10 MG Tabs tablet Generic drug: dapagliflozin  propanediol Take 1 tablet (10 mg total) by mouth daily before breakfast.   levothyroxine  175 MCG tablet Commonly known as: SYNTHROID  Take 1 tablet (175 mcg total) by mouth daily.   metFORMIN  500 MG 24 hr tablet Commonly known as: GLUCOPHAGE -XR Take 1 tablet (500 mg total) by mouth daily with breakfast.   metoprolol  succinate 25 MG 24 hr tablet Commonly known  as: TOPROL -XL Take 1 tablet (25 mg total) by mouth at bedtime.   mexiletine 150 MG capsule Commonly known as: MEXITIL  Take 1 capsule (150 mg total) by mouth 2 (two) times daily.   multivitamin with minerals tablet Take 1 tablet by mouth daily.   pantoprazole  20 MG tablet Commonly known as: Protonix  Take 1 tablet (20 mg total) by mouth 2 (two) times daily.   potassium chloride  SA 20 MEQ tablet Commonly known as: KLOR-CON  M Take 20 mEq by mouth daily as needed (Take with Lasix ).   Repatha  SureClick 140 MG/ML Soaj Generic drug: Evolocumab  Inject 140mg  into the skin once every 14 days.   sertraline  100 MG tablet Commonly known as: ZOLOFT  Take 1 tablet (100 mg total) by mouth daily.   sildenafil  100 MG tablet Commonly known as: VIAGRA  Take 1 tablet (100 mg total) by mouth as needed for erectile dysfunction. TAKE 1 (ONE) TABLET AS NEEDED. DO NOT USE AT SAME TIME AS NITRO   torsemide  20 MG tablet Commonly known as: DEMADEX  Take 2 tablets (40 mg total) by mouth daily.   traZODone  100 MG tablet Commonly known as: DESYREL  Take 1 tablet (100 mg total) by mouth at bedtime as needed for sleep.   warfarin 5 MG tablet Commonly known as: COUMADIN  Take as directed. If you are unsure how to take this medication, talk to your nurse or doctor. Original instructions: Take 0.5-1 tablets (2.5-5 mg total) by mouth daily. Take 0.5 (2.5 mg) tablet daily until follow up 8/13 What changed: additional instructions        Disposition: Home with usual follow up as in AVS  Duration of Discharge Encounter:  APP time: 24 minutes  Signed, Ozell Prentice Passey, PA-C  07/31/2024 9:03 AM

## 2024-07-31 NOTE — Plan of Care (Signed)

## 2024-07-31 NOTE — Plan of Care (Signed)

## 2024-07-31 NOTE — TOC Transition Note (Signed)
 Transition of Care [facility]) - Discharge Note   Patient Details  Name: Bobby [name] MRN: [id number] Date of Birth: 07-Dec-1971  Transition of Care [facility]) CM/SW Contact:  Waddell Barnie Rama, RN Phone Number: 07/31/2024, 9:17 AM   Clinical Narrative:    For dc today, has no needs.         Patient Goals and CMS Choice            Discharge Placement                       Discharge Plan and Services Additional resources added to the After Visit Summary for                                       Social Drivers of Health (SDOH) Interventions SDOH Screenings   Food Insecurity: No Food Insecurity (07/30/2024)  Housing: Low Risk  (07/30/2024)  Transportation Needs: No Transportation Needs (07/30/2024)  Utilities: Not At Risk (07/30/2024)  Alcohol Screen: Low Risk  (07/01/2024)  Depression (PHQ2-9): Medium Risk (07/01/2024)  Financial Resource Strain: Low Risk  (07/01/2024)  Physical Activity: Sufficiently Active (07/01/2024)  Social Connections: Moderately Isolated (07/01/2024)  Stress: Stress Concern Present (07/01/2024)  Tobacco Use: Medium Risk (07/30/2024)     Readmission Risk Interventions    07/31/2024    9:15 AM  Readmission Risk Prevention Plan  Post Dischage Appt Complete  Medication Screening Complete  Transportation Screening Complete

## 2024-07-31 NOTE — TOC CM/SW Note (Signed)
 Transition of Care [facility]) - Inpatient Brief Assessment   Patient Details  Name: Bobby [name] MRN: [id number] Date of Birth: 06-Jun-1971  Transition of Care [facility] CM/SW Contact:    Waddell Barnie Rama, RN Phone Number: 07/31/2024, 9:16 AM   Clinical Narrative: From home with spouse, has PCP and insurance on file, states has no HH services in place at this time or DME at home.  States family member will transport them home at [employer] and family is support system, states gets medications from [facility].  Pta self ambulatory.   There are no IP CM needs identified  at this time.  Please place consult for IP CM needs.     Transition of Care Asessment: Insurance and Status: Insurance coverage has been reviewed Patient has primary care physician: Yes Home environment has been reviewed: home with wife Prior level of function:: indep Prior/Current Home Services: No current home services Social Drivers of Health Review: SDOH reviewed no interventions necessary Readmission risk has been reviewed: Yes Transition of care needs: no transition of care needs at this time

## 2024-07-31 NOTE — Discharge Instructions (Signed)
 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 After 7 days, you may progress as below.  You should remove your sling 24 hours after your procedure, unless otherwise instructed by your provider.     Thursday August 07, 2024  Friday August 08, 2024 Saturday August 09, 2024 Sunday August 10, 2024   Do not lift, push, pull, or carry anything over 10 pounds with the affected arm until 6 weeks (Thursday September 11, 2024 ) after your procedure.   You may drive AFTER your wound check, unless you have been told otherwise by your provider.   Ask your healthcare provider when you can go back to work   INCISION/Dressing Continue coumadin  at half dose until next weeks visit.  If large square, outer bandage is left in place, this can be removed after 24 hours from your procedure. Do not remove steri-strips or glue as below.   Monitor your defibrillator site for redness, swelling, and drainage. Call the device clinic at [phone] if you experience these symptoms or fever/chills.  If your incision is sealed with Steri-strips or staples, you may shower 7 days after your procedure or when told by your provider. Do not remove the steri-strips or let the shower hit directly on your site. You may wash around your site with soap and water.    If you were discharged in a sling, please do not wear this during the day more than 48 hours after your surgery unless otherwise instructed. This may increase the risk of stiffness and soreness in your shoulder.   Avoid lotions, ointments, or perfumes over your incision until it is well-healed.  You may use a hot tub or a pool AFTER your wound check appointment if the incision is completely closed.  Your ICD is designed to protect you from life threatening heart rhythms. Because of this, you may receive a shock.   1 shock with no  symptoms:  Call the office during business hours. 1 shock with symptoms (chest pain, chest pressure, dizziness, lightheadedness, shortness of breath, overall feeling unwell):  Call 911. If you experience 2 or more shocks in 24 hours:  Call 911. If you receive a shock, you should not drive for 6 months per the [place] DMV IF you receive appropriate therapy from your ICD.   ICD Alerts:  Some alerts are vibratory and others beep. These are NOT emergencies. Please call our office to let us  know. If this occurs at night or on weekends, it can wait until the next business day. Send a remote transmission.  If your device is capable of reading fluid status (for heart failure), you will be offered monthly monitoring to review this with you.   DEVICE MANAGEMENT Remote monitoring is used to monitor your ICD from home. This monitoring is scheduled every 91 days by our office. It allows us  to keep an eye on the functioning of your device to ensure it is working properly. You will routinely see your Electrophysiologist annually (more often if necessary).   You should receive your ID card for your new device in 4-8 weeks. Keep this card with you at all times once received. Consider wearing a medical alert bracelet or necklace.  Your ICD  may be MRI compatible. This will be discussed at your next office visit/wound check.  You should avoid contact with strong electric or magnetic fields.  Do not use amateur (ham) radio equipment or electric (arc) welding torches. MP3 player headphones with magnets should not be used. Some devices are safe to use if held at least 12 inches (30 cm) from your defibrillator. These include power tools, lawn mowers, and speakers. If you are unsure if something is safe to use, ask your health care provider.  When using your cell phone, hold it to the ear that is on the opposite side from the defibrillator. Do not leave your cell phone in a pocket over the defibrillator.  You may safely use  electric blankets, heating pads, computers, and microwave ovens.  Call the office right away if: You have chest pain. You feel more than one shock. You feel more short of breath than you have felt before. You feel more light-headed than you have felt before. Your incision starts to open up.  This information is not intended to replace advice given to you by your health care provider. Make sure you discuss any questions you have with your health care provider.

## 2024-07-31 NOTE — Progress Notes (Signed)
 Explained discharge instructions to patient. Reviewed follow up appointment and next medication administration times. Also reviewed education. Patient verbalized having an understanding for instructions given. All belongings are in the patient's possession. Will pickup TOC meds on the way out for discharge. IV and telemetry were removed. CCMD was notified. No other needs verbalized. Will call volunteer services to transport downstairs for discharge. Ride is at the bedside.

## 2024-08-01 ENCOUNTER — Telehealth: Payer: Self-pay

## 2024-08-01 NOTE — Telephone Encounter (Signed)
 Attempted to contact patient. No answer, left message to call back.   ------------------------------------------------------------------------ Follow-up after same day discharge: Implant date: 07/30/2024 MD: Dr. Ole Holts Device: [facility] [employer] [phone] VIGILANT EL ICD Location: Left Chest   Wound check visit: 08/12/2024 @ 10:00 90 day MD follow-up: 11/03/2024 @ 1:30 PM  Remote Transmission received:Yes  Dressing/sling removed: TBD  Confirm OAC restart on: TBD (Coumadin )  Please continue to monitor your cardiac device site for redness, swelling, and drainage. Call the device clinic at [phone] if you experience these symptoms, fever/chills, or have questions about your device.   Remote monitoring is used to monitor your cardiac device from home. This monitoring is scheduled every 91 days by our office. It allows us  to keep an eye on the functioning of your device to ensure it is working properly.

## 2024-08-05 NOTE — Telephone Encounter (Signed)
 Spoke with Pt.  He states outer dressing was removed before he left the hospital and he has stopped using the sling.  Pt aware of wound check appointment.  Pt states he has an INR check tomorrow for warfarin management.  Transmission has been received.  Pt has no questions.  States he is doing well.

## 2024-08-06 ENCOUNTER — Ambulatory Visit: Attending: Cardiovascular Disease

## 2024-08-06 DIAGNOSIS — I5020 Unspecified systolic (congestive) heart failure: Secondary | ICD-10-CM

## 2024-08-06 DIAGNOSIS — I5042 Chronic combined systolic (congestive) and diastolic (congestive) heart failure: Secondary | ICD-10-CM

## 2024-08-06 DIAGNOSIS — I25110 Atherosclerotic heart disease of native coronary artery with unstable angina pectoris: Secondary | ICD-10-CM

## 2024-08-06 DIAGNOSIS — Z5181 Encounter for therapeutic drug level monitoring: Secondary | ICD-10-CM | POA: Diagnosis not present

## 2024-08-06 DIAGNOSIS — I493 Ventricular premature depolarization: Secondary | ICD-10-CM

## 2024-08-06 DIAGNOSIS — I480 Paroxysmal atrial fibrillation: Secondary | ICD-10-CM | POA: Diagnosis not present

## 2024-08-06 DIAGNOSIS — Z951 Presence of aortocoronary bypass graft: Secondary | ICD-10-CM

## 2024-08-06 DIAGNOSIS — I255 Ischemic cardiomyopathy: Secondary | ICD-10-CM | POA: Diagnosis not present

## 2024-08-06 LAB — POCT INR: INR: 1.5 — AB (ref 2.0–3.0)

## 2024-08-06 NOTE — Patient Instructions (Signed)
 Take 2 tablets today only then Continue 2.5 mg  DAILY, EXCEPT 5 mg EVERY MONDAY, WEDNESDAY and FRIDAY.  INR in 3 weeks. [phone]

## 2024-08-06 NOTE — Progress Notes (Signed)
 done

## 2024-08-12 ENCOUNTER — Ambulatory Visit: Attending: Cardiology

## 2024-08-12 DIAGNOSIS — I5042 Chronic combined systolic (congestive) and diastolic (congestive) heart failure: Secondary | ICD-10-CM

## 2024-08-12 LAB — CUP PACEART INCLINIC DEVICE CHECK
Date Time Interrogation Session: 20250819102813
Implantable Lead Connection Status: 753985
Implantable Lead Connection Status: 753985
Implantable Lead Implant Date: 20250806
Implantable Lead Implant Date: 20250806
Implantable Lead Location: 753859
Implantable Lead Location: 753860
Implantable Lead Model: 673
Implantable Lead Model: 7841
Implantable Lead Serial Number: 1632766
Implantable Lead Serial Number: 270735
Implantable Pulse Generator Implant Date: 20250806
Pulse Gen Serial Number: 702869

## 2024-08-12 NOTE — Patient Instructions (Addendum)
  After Your ICD (Implantable Cardiac Defibrillator)    Monitor your defibrillator site for redness, swelling, and drainage. Call the device clinic at [phone] if you experience these symptoms or fever/chills.  Your incision was closed with Steri-strips or staples:  You may shower 7 days after your procedure and wash your incision with soap and water. Avoid lotions, ointments, or perfumes over your incision until it is well-healed.  Do not lift, push or pull greater than 10 pounds with the affected arm until [facility]. There are no other restrictions in arm movement after your wound check appointment.  Your ICD is designed to protect you from life threatening heart rhythms. Because of this, you may receive a shock.   1 shock with no symptoms:  Call the office during business hours. 1 shock with symptoms (chest pain, chest pressure, dizziness, lightheadedness, shortness of breath, overall feeling unwell):  Call 911. If you experience 2 or more shocks in 24 hours:  Call 911. If you receive a shock, you should not drive.  [place] DMV - no driving for 6 months if you receive appropriate therapy from your ICD.   ICD Alerts:  Some alerts are vibratory and others beep. These are NOT emergencies. Please call our office to let us  know. If this occurs at night or on weekends, it can wait until the next business day. Send a remote transmission.  If your device is capable of reading fluid status (for heart failure), you will be offered monthly monitoring to review this with you.   Remote monitoring is used to monitor your ICD from home. This monitoring is scheduled every 91 days by our office. It allows us  to keep an eye on the functioning of your device to ensure it is working properly. You will routinely see your Electrophysiologist annually (more often if necessary).

## 2024-08-12 NOTE — Progress Notes (Signed)
 Normal dual chamber ICD wound check. Steri-strips removed edges of incision not fully approximated. Steri-strips reapplied & returned OV in 1 week. Wound care education provided. Presenting rhythm: AS/VS 58 . Routine testing performed. Thresholds, sensing, and impedances consistent with implant measurements with 3.5V safety margin/auto capture until 3 month visit. No treated arrhythmias. Reviewed arm restrictions to continue for 6 weeks total post op. Reviewed shock plan.  Pt enrolled in remote follow-up.

## 2024-08-14 ENCOUNTER — Ambulatory Visit: Payer: Self-pay | Admitting: Cardiology

## 2024-08-18 ENCOUNTER — Telehealth: Payer: Self-pay

## 2024-08-18 NOTE — Progress Notes (Signed)
 Attempted to call pt from recall. Lvm to return call.

## 2024-08-19 ENCOUNTER — Ambulatory Visit: Attending: Cardiology | Admitting: *Deleted

## 2024-08-19 DIAGNOSIS — I480 Paroxysmal atrial fibrillation: Secondary | ICD-10-CM

## 2024-08-19 NOTE — Progress Notes (Signed)
 Patient seen in clinic today to reassess implant surgical site which required re-application of steri strips last week as the wound edges were open in 2 ares. Steri strips removed from patient's incision site. Wound well healed and edges of both areas are approximated. No s/s of hematoma around site. Minimal bruising noted. Patient reminded of all precautions given to him at his 2 week post op visit and to continue them until his 6 week post op date. Patient verbalized understanding of what this RN told him. No charge for visit.

## 2024-08-20 ENCOUNTER — Encounter

## 2024-08-21 ENCOUNTER — Telehealth: Payer: Self-pay | Admitting: Internal Medicine

## 2024-08-21 NOTE — Telephone Encounter (Signed)
 Called to confirm/remind patient of their appointment [facility] on 08/22/24.   Appointment:   [] Confirmed  [x] Left mess   [] No answer/No voice mail  [] VM Full/unable to leave message  [] Phone not in service  Patient reminded to bring all medications and/or complete list.  Confirmed patient has transportation. Gave directions, instructed to utilize valet parking.

## 2024-08-22 ENCOUNTER — Ambulatory Visit: Admitting: Internal Medicine

## 2024-08-22 ENCOUNTER — Other Ambulatory Visit: Payer: Self-pay

## 2024-08-22 ENCOUNTER — Other Ambulatory Visit
Admission: RE | Admit: 2024-08-22 | Discharge: 2024-08-22 | Disposition: A | Discharge status: Home / Self Care | Source: Ambulatory Visit | Attending: Internal Medicine | Admitting: Internal Medicine

## 2024-08-22 VITALS — BP 120/75 | HR 62 | Wt 214.0 lb

## 2024-08-22 DIAGNOSIS — I255 Ischemic cardiomyopathy: Secondary | ICD-10-CM | POA: Diagnosis not present

## 2024-08-22 DIAGNOSIS — F32A Depression, unspecified: Secondary | ICD-10-CM | POA: Diagnosis not present

## 2024-08-22 DIAGNOSIS — Z951 Presence of aortocoronary bypass graft: Secondary | ICD-10-CM | POA: Diagnosis not present

## 2024-08-22 DIAGNOSIS — Z7982 Long term (current) use of aspirin: Secondary | ICD-10-CM | POA: Insufficient documentation

## 2024-08-22 DIAGNOSIS — E039 Hypothyroidism, unspecified: Secondary | ICD-10-CM | POA: Insufficient documentation

## 2024-08-22 DIAGNOSIS — G4733 Obstructive sleep apnea (adult) (pediatric): Secondary | ICD-10-CM | POA: Diagnosis not present

## 2024-08-22 DIAGNOSIS — Z87891 Personal history of nicotine dependence: Secondary | ICD-10-CM | POA: Diagnosis not present

## 2024-08-22 DIAGNOSIS — I252 Old myocardial infarction: Secondary | ICD-10-CM | POA: Diagnosis not present

## 2024-08-22 DIAGNOSIS — Z8249 Family history of ischemic heart disease and other diseases of the circulatory system: Secondary | ICD-10-CM | POA: Insufficient documentation

## 2024-08-22 DIAGNOSIS — I25110 Atherosclerotic heart disease of native coronary artery with unstable angina pectoris: Secondary | ICD-10-CM | POA: Diagnosis not present

## 2024-08-22 DIAGNOSIS — I480 Paroxysmal atrial fibrillation: Secondary | ICD-10-CM | POA: Insufficient documentation

## 2024-08-22 DIAGNOSIS — I5042 Chronic combined systolic (congestive) and diastolic (congestive) heart failure: Secondary | ICD-10-CM

## 2024-08-22 DIAGNOSIS — Z955 Presence of coronary angioplasty implant and graft: Secondary | ICD-10-CM | POA: Insufficient documentation

## 2024-08-22 DIAGNOSIS — Z79899 Other long term (current) drug therapy: Secondary | ICD-10-CM | POA: Diagnosis not present

## 2024-08-22 DIAGNOSIS — I2510 Atherosclerotic heart disease of native coronary artery without angina pectoris: Secondary | ICD-10-CM | POA: Insufficient documentation

## 2024-08-22 DIAGNOSIS — Z79890 Hormone replacement therapy (postmenopausal): Secondary | ICD-10-CM | POA: Diagnosis not present

## 2024-08-22 DIAGNOSIS — I110 Hypertensive heart disease with heart failure: Secondary | ICD-10-CM | POA: Insufficient documentation

## 2024-08-22 DIAGNOSIS — Z95810 Presence of automatic (implantable) cardiac defibrillator: Secondary | ICD-10-CM | POA: Insufficient documentation

## 2024-08-22 DIAGNOSIS — I513 Intracardiac thrombosis, not elsewhere classified: Secondary | ICD-10-CM | POA: Diagnosis not present

## 2024-08-22 DIAGNOSIS — I5022 Chronic systolic (congestive) heart failure: Secondary | ICD-10-CM | POA: Diagnosis not present

## 2024-08-22 DIAGNOSIS — G4700 Insomnia, unspecified: Secondary | ICD-10-CM | POA: Diagnosis not present

## 2024-08-22 DIAGNOSIS — I493 Ventricular premature depolarization: Secondary | ICD-10-CM | POA: Insufficient documentation

## 2024-08-22 DIAGNOSIS — Z7901 Long term (current) use of anticoagulants: Secondary | ICD-10-CM | POA: Diagnosis not present

## 2024-08-22 LAB — T4, FREE: Free T4: 1.3 ng/dL — ABNORMAL HIGH (ref 0.61–1.12)

## 2024-08-22 LAB — LIPID PANEL
Cholesterol: 90 mg/dL (ref 0–200)
HDL: 42 mg/dL (ref >40)
LDL Cholesterol: 31 mg/dL (ref 0–99)
Total CHOL/HDL Ratio: 2.1 ratio
Triglycerides: 84 mg/dL (ref <150)
VLDL: 17 mg/dL (ref 0–40)

## 2024-08-22 LAB — COMPREHENSIVE METABOLIC PANEL WITH GFR
ALT: 35 U/L (ref 0–44)
AST: 28 U/L (ref 15–41)
Albumin: 3.9 g/dL (ref 3.5–5.0)
Alkaline Phosphatase: 88 U/L (ref 38–126)
Anion gap: 11 (ref 5–15)
BUN: 20 mg/dL (ref 6–20)
CO2: 26 mmol/L (ref 22–32)
Calcium: 9 mg/dL (ref 8.9–10.3)
Chloride: 102 mmol/L (ref 98–111)
Creatinine, Ser: 1.28 mg/dL — ABNORMAL HIGH (ref 0.61–1.24)
GFR, Estimated: >60 mL/min (ref >60)
Glucose, Bld: 93 mg/dL (ref 70–99)
Potassium: 3.6 mmol/L (ref 3.5–5.1)
Sodium: 139 mmol/L (ref 135–145)
Total Bilirubin: 0.5 mg/dL (ref 0.0–1.2)
Total Protein: 7.1 g/dL (ref 6.5–8.1)

## 2024-08-22 LAB — TSH: TSH: 1.068 u[IU]/mL (ref 0.350–4.500)

## 2024-08-22 LAB — BRAIN NATRIURETIC PEPTIDE: B Natriuretic Peptide: 291.6 pg/mL — ABNORMAL HIGH (ref 0.0–100.0)

## 2024-08-22 LAB — DIGOXIN LEVEL: Digoxin Level: 0.7 ng/mL — ABNORMAL LOW (ref 0.8–2.0)

## 2024-08-22 MED ORDER — TORSEMIDE 20 MG PO TABS
ORAL_TABLET | ORAL | 5 refills | Status: DC
  Filled 2024-08-22: qty 35, fill #0

## 2024-08-22 MED ORDER — SPIRONOLACTONE 25 MG PO TABS
12.5000 mg | ORAL_TABLET | Freq: Every day | ORAL | 3 refills | Status: AC
  Filled 2024-08-22: qty 45, 90d supply, fill #0
  Filled 2024-11-16: qty 45, 90d supply, fill #1

## 2024-08-22 NOTE — Progress Notes (Signed)
 [facility] Consult  Note    Referring Physician: Fernande Standing, MD PCP: Bair, Kalpana, MD - no loner in practice. Awaiting new PCP Cardiologist: Timothy Gollan, MD / Loistine Sober, NP (last seen 12/24)  Chief Complaint: f/u for chronic systolic heart failure   HPI:  Bobby [name] is a 53 y/o male referred by Dr Fernande for further evaluation of his HF.   He has a history of CAD w/ stent 07/ 2018, CABG 06/24, frequent PVC's, LV apical clot, depression, hypothyroidism, HTN, OSA, PAF and chronic heart failure. He has his first MI at the age of 43.  Patient with extensive cardiac history. He was previously followed by Dr. Elmira. He underwent stenting x 2 to RCA in 2003. In July 2018 he presented with an anterior STEMI and was taken emergently to the the Cath Lab. LHC revealed in-stent CTO of RCA, ostial LAD 100%, diagonal 80%, OM 70% with left-to-right collaterals. He underwent PCI with DES to ostial LAD. Impella was placed secondary to cardiogenic shock. Echo showed EF 30 to 35%. Relook LHC August 2018 showed patent stent. cMRI 11/18 had shown LGE consistent with full-thickness clot in the context of his coronary artery disease.   Echo July 2021 40-45%. Stress test August 2023 showed abnormal perfusion with fixed defect, EF 41%, intermediate risk.   LHC August 2021 secondary to abnormal stress test was essentially unchanged from previous.   Patient presented to the ED on 06/15/2023 with chest pain and found to have new onset A-fib. He was started on Eliquis  and spontaneously converted in the ER. Troponin 97. He was seen in the office by Dr. Gollan for follow-up and continued to complain of chest pain. Outpatient heart catheterization was scheduled. Echo showed EF 25 to 30%. LHC showed significant two-vessel CAD  LAD stent patent, 90 % ostial LCx (jailed by LAD stent), RCA 100% mid. Transferred to [facility] for CTS evaluation. He underwent CABG x 1 on 06/25/2023 (LIMA to  OM-1)   cMRI 03/05/24:  1. Moderate-severely reduced systolic function (LVEF = 31%).  2. Subendocardial LGE in the LV mid to apical anterior and anteroseptal walls, apical walls.  3. Small apical thrombus noted, measures about 10 x 4 mm.  4. Moderately reduced RV function.  5. Findings suggest ischemic cardiomyopathy with prior LAD infarct.  Saw Dr. Cindie 04/30/24 plan for ICD. Repeat echo ordered   Echo 5/25 EF 35-40% RV mildly reduced. Mild Bobby -> Dr. Cindie suggested repeat echo 6-8 weeks  Echo 7/25 EF 25-30% RV low normal  CPX test 06/26/24 FEV1 2.95 (71% FVC 3.70 (69%) PVO2 17.4 (57%) - corrected to ibw 20.1 Slope 40  pRER 1.33 VE/MVV 78%   Here for f/u with his wife. Feeling a bit better. 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 Feels mood is a better. On most days he can walk around the plant without problem unless he has fluid on board. Taking torsemide  40mg  every 2-3 days as needed. No CP   ICD placed 07/30/24 - BosSCi   ROS: All systems negative except what is listed in HPI, PMH and Problem List   Past Medical History:  Diagnosis Date   Acute MI anterior wall first episode care (HCC) 07/13/2017   Occluded ostial LAD S/P 3.5x26 Onyx DES. RI 80%. RCA Occluded at prior stent site with collaterals    Allergy N/A   Seasonal Allegries   Anxiety Early 20's   Arrhythmia  Cardiogenic shock (HCC) 07/13/2017   CHF (congestive heart failure) (HCC)    Coronary artery disease    Depression Early 20's   No longer taking Meds   Elevated troponin 06/29/2020   Encounter for screening for HIV 05/27/2023   GERD (gastroesophageal reflux disease)    Graves disease    Heart murmur Early 1980's   Hypertension    MI (myocardial infarction) (HCC)    2002 (first), 2018   Myalgia 05/21/2019   Nausea vomiting and diarrhea 06/29/2020   Sleep apnea    Thyroid  disease    thyroid  irradiated per pt   Wound infection after surgery  11/16/2023    Current Outpatient Medications  Medication Sig Dispense Refill   acetaminophen  (TYLENOL ) 325 MG tablet Take 1-2 tablets (325-650 mg total) by mouth every 4 (four) hours as needed for mild pain (pain score 1-3).     amiodarone  (PACERONE ) 200 MG tablet Take 2 tablets (400 mg total) by mouth 2 (two) times daily for 14 days, THEN 1.5 tablets (300 mg total) daily. (Patient taking differently: Take 200 mg by mouth daily.) 191 tablet 3   aspirin  81 MG chewable tablet Chew 1 tablet (81 mg total) by mouth daily. 30 tablet 11   atorvastatin  (LIPITOR) 20 MG tablet Take 1 tablet (20 mg total) by mouth daily. 90 tablet 3   dapagliflozin  propanediol (FARXIGA ) 10 MG TABS tablet Take 1 tablet (10 mg total) by mouth daily before breakfast. 90 tablet 3   digoxin  (LANOXIN ) 0.125 MG tablet Take 1 tablet (0.125 mg total) by mouth daily. 30 tablet 5   Evolocumab  (REPATHA  SURECLICK) 140 MG/ML SOAJ Inject 140mg  into the skin once every 14 days. 6 mL 3   ezetimibe  (ZETIA ) 10 MG tablet Take 1 tablet (10 mg total) by mouth daily. 90 tablet 3   levothyroxine  (SYNTHROID ) 175 MCG tablet Take 1 tablet (175 mcg total) by mouth daily. 60 tablet 1   metFORMIN  (GLUCOPHAGE -XR) 500 MG 24 hr tablet Take 1 tablet (500 mg total) by mouth daily with breakfast. 90 tablet 1   metoprolol  succinate (TOPROL -XL) 25 MG 24 hr tablet Take 1 tablet (25 mg total) by mouth at bedtime. 90 tablet 2   mexiletine (MEXITIL ) 150 MG capsule Take 1 capsule (150 mg total) by mouth 2 (two) times daily. 180 capsule 1   Multiple Vitamins-Minerals (MULTIVITAMIN WITH MINERALS) tablet Take 1 tablet by mouth daily.     pantoprazole  (PROTONIX ) 20 MG tablet Take 1 tablet (20 mg total) by mouth 2 (two) times daily. 180 tablet 2   potassium chloride  SA (KLOR-CON  M) 20 MEQ tablet Take 20 mEq by mouth daily as needed (Take with Lasix ).     sertraline  (ZOLOFT ) 100 MG tablet Take 1 tablet (100 mg total) by mouth daily. 90 tablet 3   sildenafil  (VIAGRA ) 100  MG tablet Take 1 tablet (100 mg total) by mouth as needed for erectile dysfunction. TAKE 1 (ONE) TABLET AS NEEDED. DO NOT USE AT SAME TIME AS NITRO 30 tablet 3   torsemide  (DEMADEX ) 20 MG tablet Take 2 tablets (40 mg total) by mouth daily. 180 tablet 3   traZODone  (DESYREL ) 100 MG tablet Take 1 tablet (100 mg total) by mouth at bedtime as needed for sleep. 20 tablet 0   warfarin (COUMADIN ) 5 MG tablet Take 0.5-1 tablets (2.5-5 mg total) by mouth daily. Take 0.5 (2.5 mg) tablet daily until follow up 8/13     No current facility-administered medications for this visit.    No  Known Allergies    Social History   Socioeconomic History   Marital status: Married    Spouse name: Not on file   Number of children: 2   Years of education: Not on file   Highest education level: Associate degree: occupational, Scientist, product/process development, or vocational program  Occupational History   Not on file  Tobacco Use   Smoking status: Former    Current packs/day: 0.00    Average packs/day: 0.5 packs/day for 20.0 years (10.0 ttl pk-yrs)    Types: Cigarettes    Start date: 07/13/1997    Quit date: 07/13/2017    Years since quitting: 7.1   Smokeless tobacco: Never   Tobacco comments:    quit 2012  Vaping Use   Vaping status: Never Used  Substance and Sexual Activity   Alcohol use: Not Currently    Comment: occ   Drug use: Not Currently   Sexual activity: Yes  Other Topics Concern   Not on file  Social History Narrative   Lives with fiance, working FT , disesel equip, right handed, 2 kids, college edu   Social Drivers of Health   Financial Resource Strain: Low Risk  (07/01/2024)   Overall Financial Resource Strain (CARDIA)    Difficulty of Paying Living Expenses: Not very hard  Food Insecurity: No Food Insecurity (07/30/2024)   Hunger Vital Sign    Worried About Running Out of Food in the Last Year: Never true    Ran Out of Food in the Last Year: Never true  Transportation Needs: No Transportation Needs  (07/30/2024)   PRAPARE - Administrator, Civil Service (Medical): No    Lack of Transportation (Non-Medical): No  Physical Activity: Sufficiently Active (07/01/2024)   Exercise Vital Sign    Days of Exercise per Week: 5 days    Minutes of Exercise per Session: 40 min  Stress: Stress Concern Present (07/01/2024)   [employer] of Occupational Health - Occupational Stress Questionnaire    Feeling of Stress: To some extent  Social Connections: Moderately Isolated (07/01/2024)   Social Connection and Isolation Panel    Frequency of Communication with Friends and Family: Three times a week    Frequency of Social Gatherings with Friends and Family: Patient declined    Attends Religious Services: Patient declined    Active Member of Clubs or Organizations: No    Attends Engineer, structural: Not on file    Marital Status: Married  Catering manager Violence: Not At Risk (07/30/2024)   Humiliation, Afraid, Rape, and Kick questionnaire    Fear of Current or Ex-Partner: No    Emotionally Abused: No    Physically Abused: No    Sexually Abused: No      Family History  Problem Relation Age of Onset   Valvular heart disease Mother    Depression Mother    Heart disease Father 78       CABG   Hypertension Father    Heart attack Father    Heart disease Paternal Grandmother    Vitals:   08/22/24 1153  BP: 120/75  Pulse: 62  SpO2: 98%  Weight: 214 lb (97.1 kg)     Wt Readings from Last 3 Encounters:  08/22/24 214 lb (97.1 kg)  07/31/24 217 lb 14.4 oz (98.8 kg)  07/01/24 217 lb 12.8 oz (98.8 kg)   Lab Results  Component Value Date   CREATININE 1.64 (H) 07/23/2024   CREATININE 1.63 (H) 07/01/2024   CREATININE 1.57 (H)  04/29/2024   PHYSICAL EXAM: General:  Well appearing. No resp difficulty HEENT: normal Neck: supple. no JVD. Carotids 2+ bilat; no bruits. No lymphadenopathy or thryomegaly appreciated. Cor: PMI nondisplaced. Regular rate & rhythm. No rubs, gallops  or murmurs. Lungs: clear Abdomen: soft, nontender, nondistended. No hepatosplenomegaly. No bruits or masses. Good bowel sounds. Extremities: no cyanosis, clubbing, rash, edema Neuro: alert & orientedx3, cranial nerves grossly intact. moves all 4 extremities w/o difficulty. Affect pleasant  ReDS 36%   ASSESSMENT & PLAN:  1. Chronic systolic HF due to iCM - EF chronically low since 2018 (30-35%), multiple PCIs, eventual CABG   - RHC 6/24 (pre CABG) RA 7, PA 28/15, PCW 16, PA sat 71%, FICK CO/CI 5.1/2.3  - Echo 12/20/23: EF 25-30%, chronic thrombus in apex, RV mildly reduced, moderate LAE - cMRI 03/05/24: LVEF 31%, RVEF 36%, Subendocardial LGE in the LV mid to apical anterior and anteroseptal walls, apical walls c/w ICM, + small apical thrombus  - Echo 5/25 EF 35-40% RV ok. Mild Bobby No LV clot - Echo 7/25 EF 25-30% RV low normal - CPX 7/25 FEV1 2.95 (71%) FVC 3.70 (69%) PVO2 17.4 (57%) - corrected to ibw 20.1 Slope 40  pRER 1.33 VE/MVV 78%  - s/p BosSCi ICD 7/25 - Stable NYHA III - Volume ok  Switch torsemide  to 20 daily + PDN - continue farxiga  10mg  daily - continue metoprolol  succinate 25mg  daily - Continue digoxin  0.125 - Off spiro and losartan  due to low BP -> restart spiro 12.5 - Check labs today and 1 week (adding spiro)  - ICD interrogated personally: HL still calibrating. No VT/AF. Activity 2h/day.  - Long talk with him and his wife about HF trajectory based on CPX results. Currently too early for advanced therapies. Continue to titrate GDMT. Encourage exercise. Follow closely   2. Atrial fibrillation/ PVC's- - Followed by EP - controlled on amiodarone  200mg  daily and mexiletine 150mg  BID - zio 2/25: Sinus Rhythm avg HR 67 Occasional PACs. Rare PVCs - Zio 4/25 Sinus avg 68 Frequent PVCs 6.3% - in NSR. Check labs today  3. CAD- - 1st MI at age 23 treated w/ PCI, CABG x 1 06/24 (LIMA-OM1)  - continue ASA 81mg  daily - continue atorvastatin  20mg  daily - continue repatha  140mg   every 2 weeks - continue ezetimibe  10mg  daily - no s/s angina - manage dby Gen Cards  4. Hypothyroidism- - likely due to amio - continue synthroid   - TSH ok (3.7 on 04/29/24) - recheck TFTs today  5. OSA- - continue  6. Depression/insomnia - on Sertraline   I spent a total of 45 minutes today: 1) reviewing the patient's medical records including previous charts, labs and recent notes from other providers; 2) examining the patient and counseling them on their medical issues/explaining the plan of care; 3) adjusting meds as needed and 4) ordering lab work or other needed tests.     Bobby Fuel, MD 08/22/24

## 2024-08-22 NOTE — Patient Instructions (Signed)
 Medication Changes:  Take Torsemide  20 MG once daily. Take an additional tablet as needed for weight gain or swelling.  Start Spironolactone  12.5 MG once daily (1/2 tablet)   Lab Work:  Go over to the [facility]. Go pass the gift shop and have your blood work completed.  We will only call you if the results are abnormal or if the provider would like to make medication changes.  No news is good news.   Follow-Up in: 3 months with Dr. Bensimhon.  Our Doctors' schedules are NOT open yet for 3 months. We will place you on our recall list. Once they are available, we will call you to schedule your follow up appointment.    Thank you for choosing [facility].    At the [facility], you and your health needs are our priority. We have a designated team specialized in the treatment of Heart Failure. This Care Team includes your primary Heart Failure Specialized Cardiologist (physician), Advanced Practice Providers (APPs- Physician Assistants and Nurse Practitioners), and Pharmacist who all work together to provide you with the care you need, when you need it.   You may see any of the following providers on your designated Care Team at your next follow up:  Dr. Toribio Fuel Dr. Ezra Shuck Dr. Ria Commander Dr. Morene Brownie Ellouise Class, FNP Jaun Bash, RPH-CPP  Please be sure to bring in all your medications bottles to every appointment.   Need to Contact Us :  If you have any questions or concerns before your next appointment please send us  a message through [place] or call our office at [phone].    TO LEAVE A MESSAGE FOR THE NURSE SELECT OPTION 2, PLEASE LEAVE A MESSAGE INCLUDING: YOUR NAME DATE OF BIRTH CALL BACK NUMBER REASON FOR CALL**this is important as we prioritize the call backs  YOU WILL RECEIVE A CALL BACK THE SAME DAY AS LONG AS YOU CALL BEFORE 4:00 PM

## 2024-08-23 LAB — T3, FREE: T3, Free: 2.2 pg/mL (ref 2.0–4.4)

## 2024-08-27 ENCOUNTER — Ambulatory Visit: Attending: Cardiovascular Disease

## 2024-08-27 DIAGNOSIS — I5020 Unspecified systolic (congestive) heart failure: Secondary | ICD-10-CM

## 2024-08-27 DIAGNOSIS — Z5181 Encounter for therapeutic drug level monitoring: Secondary | ICD-10-CM | POA: Diagnosis not present

## 2024-08-27 DIAGNOSIS — I5042 Chronic combined systolic (congestive) and diastolic (congestive) heart failure: Secondary | ICD-10-CM

## 2024-08-27 DIAGNOSIS — I480 Paroxysmal atrial fibrillation: Secondary | ICD-10-CM | POA: Diagnosis not present

## 2024-08-27 DIAGNOSIS — I255 Ischemic cardiomyopathy: Secondary | ICD-10-CM

## 2024-08-27 DIAGNOSIS — Z951 Presence of aortocoronary bypass graft: Secondary | ICD-10-CM

## 2024-08-27 DIAGNOSIS — I25110 Atherosclerotic heart disease of native coronary artery with unstable angina pectoris: Secondary | ICD-10-CM

## 2024-08-27 DIAGNOSIS — I493 Ventricular premature depolarization: Secondary | ICD-10-CM

## 2024-08-27 LAB — POCT INR: INR: 2.7 (ref 2.0–3.0)

## 2024-08-27 NOTE — Patient Instructions (Signed)
 Continue 2.5 mg  DAILY, EXCEPT 5 mg EVERY MONDAY, WEDNESDAY and FRIDAY.  INR in 6 weeks. [phone]

## 2024-09-05 ENCOUNTER — Encounter: Payer: Self-pay | Admitting: Cardiovascular Disease

## 2024-09-05 DIAGNOSIS — G4733 Obstructive sleep apnea (adult) (pediatric): Secondary | ICD-10-CM

## 2024-09-10 ENCOUNTER — Other Ambulatory Visit: Payer: Self-pay | Admitting: Cardiovascular Disease

## 2024-09-10 DIAGNOSIS — I5020 Unspecified systolic (congestive) heart failure: Secondary | ICD-10-CM

## 2024-09-11 ENCOUNTER — Encounter

## 2024-09-11 ENCOUNTER — Other Ambulatory Visit: Payer: Self-pay | Admitting: Internal Medicine

## 2024-09-11 ENCOUNTER — Other Ambulatory Visit: Payer: Self-pay

## 2024-09-11 ENCOUNTER — Encounter: Payer: Self-pay | Admitting: Cardiovascular Disease

## 2024-09-11 ENCOUNTER — Other Ambulatory Visit: Payer: Self-pay | Admitting: Student

## 2024-09-11 ENCOUNTER — Other Ambulatory Visit: Payer: Self-pay | Admitting: Cardiovascular Disease

## 2024-09-11 DIAGNOSIS — I5020 Unspecified systolic (congestive) heart failure: Secondary | ICD-10-CM

## 2024-09-11 MED ORDER — EZETIMIBE 10 MG PO TABS
10.0000 mg | ORAL_TABLET | Freq: Every day | ORAL | 3 refills | Status: AC
  Filled 2024-09-11 – 2025-01-25 (×3): qty 90, 90d supply, fill #0

## 2024-09-11 MED ORDER — DAPAGLIFLOZIN PROPANEDIOL 10 MG PO TABS
10.0000 mg | ORAL_TABLET | Freq: Every day | ORAL | 3 refills | Status: AC
  Filled 2024-09-11 – 2024-12-10 (×2): qty 90, 90d supply, fill #0

## 2024-09-11 MED ORDER — METOPROLOL SUCCINATE ER 25 MG PO TB24
25.0000 mg | ORAL_TABLET | Freq: Every day | ORAL | 3 refills | Status: AC
  Filled 2024-09-11 – 2025-01-25 (×3): qty 90, 90d supply, fill #0

## 2024-09-11 MED ORDER — ATORVASTATIN CALCIUM 20 MG PO TABS
20.0000 mg | ORAL_TABLET | Freq: Every day | ORAL | 3 refills | Status: AC
  Filled 2024-09-11 – 2024-10-01 (×2): qty 90, 90d supply, fill #0
  Filled 2025-01-08: qty 90, 90d supply, fill #1
  Filled 2025-01-09: qty 90, 90d supply, fill #0

## 2024-09-11 MED ORDER — DAPAGLIFLOZIN PROPANEDIOL 10 MG PO TABS
10.0000 mg | ORAL_TABLET | Freq: Every day | ORAL | 0 refills | Status: DC
  Filled 2024-09-11: qty 90, 90d supply, fill #0

## 2024-09-11 MED ORDER — DIGOXIN 125 MCG PO TABS
0.1250 mg | ORAL_TABLET | Freq: Every day | ORAL | 5 refills | Status: DC
  Filled 2024-09-11 – 2024-10-19 (×2): qty 30, 30d supply, fill #0
  Filled 2024-11-16: qty 30, 30d supply, fill #1

## 2024-09-12 DIAGNOSIS — G4733 Obstructive sleep apnea (adult) (pediatric): Secondary | ICD-10-CM | POA: Insufficient documentation

## 2024-09-12 NOTE — Telephone Encounter (Signed)
 1. OSA (obstructive sleep apnea) (Primary) - Ambulatory referral to Pulmonology  Luke Shade, MD

## 2024-09-15 ENCOUNTER — Encounter: Payer: Self-pay | Admitting: Podiatry

## 2024-09-15 ENCOUNTER — Ambulatory Visit (INDEPENDENT_AMBULATORY_CARE_PROVIDER_SITE_OTHER)

## 2024-09-15 ENCOUNTER — Other Ambulatory Visit: Payer: Self-pay

## 2024-09-15 ENCOUNTER — Ambulatory Visit (INDEPENDENT_AMBULATORY_CARE_PROVIDER_SITE_OTHER): Payer: Self-pay | Admitting: Podiatry

## 2024-09-15 VITALS — Ht 73.0 in | Wt 214.0 lb

## 2024-09-15 DIAGNOSIS — M7751 Other enthesopathy of right foot: Secondary | ICD-10-CM

## 2024-09-15 DIAGNOSIS — M6740 Ganglion, unspecified site: Secondary | ICD-10-CM

## 2024-09-15 MED FILL — Levothyroxine Sodium Tab 175 MCG: ORAL | 60 days supply | Qty: 60 | Fill #1 | Status: AC

## 2024-09-15 NOTE — Progress Notes (Signed)
 Chief Complaint  Patient presents with   Ganglion Cyst    Pt is here due to cyst on the right foot 2nd and 4th toes, states he had it before on the left foot, feels the same way, didn't notice it till he started working out and rub against his shoes, states the area is sore due to the rubbing.    HPI: 53 y.o. male presenting today as a reestablish new patient for above complaint  Past Medical History:  Diagnosis Date   Acute MI anterior wall first episode care (HCC) 07/13/2017   Occluded ostial LAD S/P 3.5x26 Onyx DES. RI 80%. RCA Occluded at prior stent site with collaterals    Allergy N/A   Seasonal Allegries   Anxiety Early 20's   Arrhythmia    Cardiogenic shock (HCC) 07/13/2017   CHF (congestive heart failure) (HCC)    Coronary artery disease    Depression Early 20's   No longer taking Meds   Elevated troponin 06/29/2020   Encounter for screening for HIV 05/27/2023   GERD (gastroesophageal reflux disease)    Graves disease    Heart murmur Early 1980's   Hypertension    MI (myocardial infarction) (HCC)    2002 (first), 2018   Myalgia 05/21/2019   Nausea vomiting and diarrhea 06/29/2020   Sleep apnea    Thyroid  disease    thyroid  irradiated per pt   Wound infection after surgery 11/16/2023    Past Surgical History:  Procedure Laterality Date   ABDOMINAL AORTOGRAM N/A 06/22/2023   Procedure: ABDOMINAL AORTOGRAM;  Surgeon: Mady Bruckner, MD;  Location: [facility] INVASIVE CV LAB;  Service: Cardiovascular;  Laterality: N/A;   CORONARY ANGIOPLASTY WITH STENT PLACEMENT     CORONARY ARTERY BYPASS GRAFT N/A 06/25/2023   Procedure: CORONARY ARTERY BYPASS GRAFTING (CABG) x1 USING LEFT INTERNAL MAMMARY ARTERY (LIMA);  Surgeon: Kerrin Elspeth BROCKS, MD;  Location: [facility] OR;  Service: Open Heart Surgery;  Laterality: N/A;   CORONARY/GRAFT ACUTE MI REVASCULARIZATION N/A 07/13/2017   Procedure: Coronary/Graft Acute MI Revascularization;  Surgeon: Ladona Heinz, MD;  Location: [facility] INVASIVE CV  LAB;  Service: Cardiovascular;  Laterality: N/A;   ESOPHAGOGASTRODUODENOSCOPY N/A 06/30/2020   Procedure: ESOPHAGOGASTRODUODENOSCOPY (EGD);  Surgeon: Toledo, Ladell POUR, MD;  Location: [facility] ENDOSCOPY;  Service: Gastroenterology;  Laterality: N/A;   ICD IMPLANT N/A 07/30/2024   Procedure: ICD IMPLANT;  Surgeon: Cindie Ole DASEN, MD;  Location: [facility] INVASIVE CV LAB;  Service: Cardiovascular;  Laterality: N/A;   LEFT HEART CATH AND CORONARY ANGIOGRAPHY N/A 07/13/2017   Procedure: Left Heart Cath and Coronary Angiography;  Surgeon: Ladona Heinz, MD;  Location: [facility] INVASIVE CV LAB;  Service: Cardiovascular;  Laterality: N/A;   LEFT HEART CATH AND CORONARY ANGIOGRAPHY N/A 08/21/2017   Procedure: LEFT HEART CATH AND CORONARY ANGIOGRAPHY;  Surgeon: Elmira Newman PARAS, MD;  Location: [facility] INVASIVE CV LAB;  Service: Cardiovascular;  Laterality: N/A;   LEFT HEART CATH AND CORONARY ANGIOGRAPHY N/A 08/10/2020   Procedure: LEFT HEART CATH AND CORONARY ANGIOGRAPHY;  Surgeon: Elmira Newman PARAS, MD;  Location: [facility] INVASIVE CV LAB;  Service: Cardiovascular;  Laterality: N/A;   RIGHT/LEFT HEART CATH AND CORONARY ANGIOGRAPHY N/A 06/22/2023   Procedure: RIGHT/LEFT HEART CATH AND CORONARY ANGIOGRAPHY;  Surgeon: Mady Bruckner, MD;  Location: [facility] INVASIVE CV LAB;  Service: Cardiovascular;  Laterality: N/A;   SKIN GRAFT     TEE WITHOUT CARDIOVERSION N/A 06/25/2023   Procedure: TRANSESOPHAGEAL ECHOCARDIOGRAM;  Surgeon: Kerrin Elspeth BROCKS, MD;  Location: [facility] OR;  Service: Open  Heart Surgery;  Laterality: N/A;   VENTRICULAR ASSIST DEVICE INSERTION N/A 07/13/2017   Procedure: Ventricular Assist Device Insertion;  Surgeon: Ladona Heinz, MD;  Location: [facility] INVASIVE CV LAB;  Service: Cardiovascular;  Laterality: N/A;    No Known Allergies   Physical Exam: General: The patient is alert and oriented x3 in no acute distress.  Dermatology: Skin is warm, dry and supple bilateral lower extremities.   Vascular: Palpable pedal pulses  bilaterally. Capillary refill within normal limits.  No appreciable edema.  No erythema.  Neurological: Grossly intact via light touch  Musculoskeletal Exam: No pedal deformities noted.  Symptomatic mucoid cyst noted overlying the DIPJ of the 2nd and 4th digit of the right foot  Radiographic Exam RT foot 09/15/2024:  Normal osseous mineralization. Joint spaces preserved.  No fractures or osseous irregularities noted.  Impression: Negative  Assessment/Plan of Care: 1.  Chronic mucoid cyst 2nd and 4th digit right foot 2.  History of mucoid cyst and DIPJ arthroplasty second digit left foot  -Patient evaluated.  X-rays reviewed -Patient has had the mucoid cyst lesions to the right foot now for over 1 year now.  They are very aggravating and he has to tape his toes every time that he exercises.  He has good success in the past with DIPJ arthroplasty.  I do believe that surgery would benefit the patient since they have been present for over 1 year -Today we discussed the risk benefits advantages and disadvantages of the procedure.  All patient questions answered.  No guarantees were expressed or implied.  He consents for surgery -Authorization for surgery initiated today.  Surgery will consist of DIPJ arthroplasty 2nd and 4th digit right foot -Needs PCP and cardiac clearance prior -Return to clinic 1 week postop       Thresa EMERSON Sar, DPM [facility]  Dr. Thresa EMERSON Sar, DPM    [address] [place], [place] [address]                Office [phone]  Fax [phone]

## 2024-09-16 ENCOUNTER — Ambulatory Visit (INDEPENDENT_AMBULATORY_CARE_PROVIDER_SITE_OTHER)

## 2024-09-16 ENCOUNTER — Other Ambulatory Visit: Payer: Self-pay

## 2024-09-16 DIAGNOSIS — I480 Paroxysmal atrial fibrillation: Secondary | ICD-10-CM | POA: Diagnosis not present

## 2024-09-17 ENCOUNTER — Ambulatory Visit: Payer: Self-pay | Admitting: Cardiology

## 2024-09-17 LAB — CUP PACEART REMOTE DEVICE CHECK
Battery Remaining Longevity: 156 mo
Battery Remaining Percentage: 100 %
Brady Statistic RA Percent Paced: 2 %
Brady Statistic RV Percent Paced: 0 %
Date Time Interrogation Session: 20250923102600
HighPow Impedance: 58 Ohm
Implantable Lead Connection Status: 753985
Implantable Lead Connection Status: 753985
Implantable Lead Implant Date: 20250806
Implantable Lead Implant Date: 20250806
Implantable Lead Location: 753859
Implantable Lead Location: 753860
Implantable Lead Model: 673
Implantable Lead Model: 7841
Implantable Lead Serial Number: 1632766
Implantable Lead Serial Number: 270735
Implantable Pulse Generator Implant Date: 20250806
Lead Channel Impedance Value: 431 Ohm
Lead Channel Impedance Value: 500 Ohm
Lead Channel Pacing Threshold Amplitude: 0.5 V
Lead Channel Pacing Threshold Amplitude: 1.1 V
Lead Channel Pacing Threshold Pulse Width: 0.4 ms
Lead Channel Pacing Threshold Pulse Width: 0.4 ms
Lead Channel Setting Pacing Amplitude: 3.5 V
Lead Channel Setting Pacing Amplitude: 3.5 V
Lead Channel Setting Pacing Pulse Width: 0.4 ms
Lead Channel Setting Sensing Sensitivity: 0.5 mV
Pulse Gen Serial Number: 702869
Zone Setting Status: 755011

## 2024-09-17 NOTE — Progress Notes (Signed)
Remote ICD Transmission.

## 2024-09-19 MED FILL — Mexiletine HCl Cap 150 MG: ORAL | 90 days supply | Qty: 180 | Fill #1 | Status: AC

## 2024-09-22 ENCOUNTER — Ambulatory Visit: Admitting: Nurse Practitioner

## 2024-09-24 ENCOUNTER — Encounter: Payer: Self-pay | Admitting: Podiatry

## 2024-09-24 ENCOUNTER — Telehealth: Payer: Self-pay | Admitting: Podiatry

## 2024-09-24 NOTE — Telephone Encounter (Signed)
 DOS- 10/09/2024  2ND + 4TH HAMMERTOE REPAIR RT- 71714  [employer] EFFECTIVE DATE- 12/26/2023  DEDUCTIBLE- $600 REMAINING- $0 OOP- $9200 REMAINING- $1300 FAMILY DEDUCTIBLE- $1200 REMAINING- $500 FAMILY OOP- $18400 REMAINING- $10288.51 COINSURANCE- 30%  PER AVAILITY PORTAL, NO PRIOR AUTH IS REQUIRED FOR CPT CODE 71714 (2 UNITS). DOCUMENTATION ATTACHED TO SURGERY CONSENT PACKET.

## 2024-09-25 ENCOUNTER — Telehealth: Payer: Self-pay

## 2024-09-25 NOTE — Telephone Encounter (Signed)
 Received documentation from podiatry requesting pre-op evaluation for right foot surgery, scheduled for 10/09/24. Please reach out to the patient to schedule an appointment prior to the procedure. Also recommend patient see cardiologist for preop evaluation given his cardiovascular history.   Thank you,  Luke Shade, MD

## 2024-09-25 NOTE — Telephone Encounter (Signed)
 Yes.   Thank you,  Luke Shade, MD

## 2024-09-25 NOTE — Telephone Encounter (Signed)
 Copied from CRM [id number]. Topic: Appointments - Scheduling Inquiry for Clinic >> Sep 25, 2024 12:16 PM [place] A wrote: Reason for CRM: Patient's wife called said per Dr.Bair that patient needed appointment before surgery however next available is November 11. Please contact

## 2024-09-26 NOTE — Telephone Encounter (Signed)
 Lm for patient to call and schedule preop medical clearance with provider. When patient calls Darice or Luke needs to schedule appt.  Thank you

## 2024-09-29 ENCOUNTER — Ambulatory Visit (INDEPENDENT_AMBULATORY_CARE_PROVIDER_SITE_OTHER)

## 2024-09-29 ENCOUNTER — Telehealth: Payer: Self-pay | Admitting: Podiatry

## 2024-09-29 VITALS — BP 104/60 | HR 68 | Temp 98.3°F | Ht 73.0 in | Wt 211.6 lb

## 2024-09-29 DIAGNOSIS — Z01818 Encounter for other preprocedural examination: Secondary | ICD-10-CM | POA: Diagnosis not present

## 2024-09-29 DIAGNOSIS — N1831 Chronic kidney disease, stage 3a: Secondary | ICD-10-CM | POA: Diagnosis not present

## 2024-09-29 DIAGNOSIS — E039 Hypothyroidism, unspecified: Secondary | ICD-10-CM | POA: Diagnosis not present

## 2024-09-29 DIAGNOSIS — D649 Anemia, unspecified: Secondary | ICD-10-CM | POA: Insufficient documentation

## 2024-09-29 DIAGNOSIS — F39 Unspecified mood [affective] disorder: Secondary | ICD-10-CM

## 2024-09-29 HISTORY — DX: Encounter for other preprocedural examination: Z01.818

## 2024-09-29 LAB — TSH: TSH: 0.46 u[IU]/mL (ref 0.35–5.50)

## 2024-09-29 LAB — T4, FREE: Free T4: 1.54 ng/dL (ref 0.60–1.60)

## 2024-09-29 NOTE — Telephone Encounter (Signed)
 Pts wife left message they seen the pcp this morning and they told pt he would need to see cardiology before she would sign the H&P forms.The pts wife was not sure if we had been contacted or what needed to be done.   I returned call and told pt we had not heard back from pcp and then looked and pt has appt with cardiology on 10/14 and surgery is on 10/16. I told her that should be ok as long as we get it back before surgery. She asked about his coumadin  as well and I told her to ask his cardiologist as well.

## 2024-09-29 NOTE — Assessment & Plan Note (Signed)
 Check TSH, T4, continue levothyroxine  175 mcg daily. Suspected secondary to Amiodarone .

## 2024-09-29 NOTE — Assessment & Plan Note (Signed)
 Check CMP today

## 2024-09-29 NOTE — Assessment & Plan Note (Addendum)
 Patient with extensive cardiac history and ongoing fatigue. Recommend cardiology evaluation for cardiac clearance, cardiac medications recommendations for perioperative period and optimization of his cardiac conditions before surgery scheduled for 10/08/2024. Since his surgery is scheduled for 10/09/24 and his cardiology appointment is not till 10/08/2024, recommend postponing surgery for right foot ganglionic cyst removal.   Ischemic cardiomyopathy  ICD in place 07/30/2024  CAD w/ stent 07/ 2018 CABG 06/24 Hypertension Chronic heart failure, LVEF 31%  Atrial fibrillation/PVC's Hyperlipidemia On anticoagulation and multiple antiarrythmic medications.   Mood disorder: fairly stable on Sertraline  100 mg daily. Which he sould continue daily.   Acquired hypothyroidism:  Suspected secondary to Amiodarone . Currently on Synthroid  175 mcg daily. We will repeat TSH, T4 today.   Type II DM  On Metformin  500 mg daily, continue daily.   GERD:  Stable on Pantoprazole  20 mg BID, which he can continue twice daily.   CKD IIIa:  Avoid nephrotoxic medications/substances like NSAIDS, IV contrast.

## 2024-09-29 NOTE — Progress Notes (Signed)
 Acute Office Visit  Subjective:    Patient ID: Bobby [name], male    DOB: 12-06-1971, 53 y.o.   MRN: [id number]  Chief Complaint  Patient presents with   Dehydration   Pre-op Exam   HPI Pt is a 53 y.o. male who is here for preoperative clearance for right foot surgery for ganglion cyst removal which is scheduled for 10/09/24 with [facility] Thresa CHRISTELLA Sar. He previously had similar cyst removal from left foot without complications or reoccurrence. Due to patient's complex cardiovascular history surgical clearance is requested.  Patient reports he did mountain bike riding this weekend. He needed to stop multiple times to stop to catch a breath. He denies chest pain, palpitations but reports of HR being high (120s and as high as 160/min when he was exerting). He continues to have trouble falling asleep. Once he falls asleep he sleeps through the night. He has taken Trazodone  in the past to help with sleep but wakes up feeling groggy, as a result does not like taking Trazodone . He takes Sertraline  100 mg daily for mood disorder. No thought of SI/HI. He is not interested for behavioral health intervention at this time.   Patient denies previous s/e to anesthesia. He has significant cardiovascular disease including ischemic cardiomyopathy, CAD, chronic CHF, has ICD in place, frequent PVCs, paroxymal afib.   He also has a h/o OSA and is waiting to be evaluated by pulmonology on 10/13/2024.    ROS As per HPI    Objective:    BP 104/60 (BP Location: Right Arm, Patient Position: Sitting, Cuff Size: Normal)   Pulse 68   Temp 98.3 F (36.8 C) (Oral)   Ht 6' 1 (1.854 m)   Wt 211 lb 9.6 oz (96 kg)   SpO2 98%   BMI 27.92 kg/m    Physical Exam Constitutional:      General: He is not in acute distress. HENT:     Head: Normocephalic and atraumatic.     Mouth/Throat:     Mouth: Mucous membranes are moist.  Cardiovascular:     Rate and Rhythm: Normal rate and regular  rhythm.  Pulmonary:     Effort: Pulmonary effort is normal.     Breath sounds: Normal breath sounds.  Abdominal:     General: Bowel sounds are normal.     Palpations: Abdomen is soft.     Tenderness: There is no guarding.  Musculoskeletal:     Right lower leg: No edema.     Left lower leg: No edema.  Lymphadenopathy:     Cervical: No cervical adenopathy.  Neurological:     Mental Status: He is alert and oriented to person, place, and time.  Psychiatric:        Mood and Affect: Mood normal.        Behavior: Behavior is cooperative.        Thought Content: Thought content does not include homicidal or suicidal ideation. Thought content does not include homicidal or suicidal plan.     No results found for any visits on 09/29/24.     Assessment & Plan:  Preoperative clearance Assessment & Plan: Patient with extensive cardiac history and ongoing fatigue. Recommend cardiology evaluation for cardiac clearance, cardiac medications recommendations for perioperative period and optimization of his cardiac conditions before surgery scheduled for 10/08/2024. Since his surgery is scheduled for 10/09/24 and his cardiology appointment is not till 10/08/2024, recommend postponing surgery for right foot ganglionic cyst removal.  Ischemic cardiomyopathy  ICD in place 07/30/2024  CAD w/ stent 07/ 2018 CABG 06/24 Hypertension Chronic heart failure, LVEF 31%  Atrial fibrillation/PVC's Hyperlipidemia On anticoagulation and multiple antiarrythmic medications.   Mood disorder: fairly stable on Sertraline  100 mg daily. Which he sould continue daily.   Acquired hypothyroidism:  Suspected secondary to Amiodarone . Currently on Synthroid  175 mcg daily. We will repeat TSH, T4 today.   Type II DM  On Metformin  500 mg daily, continue daily.   GERD:  Stable on Pantoprazole  20 mg BID, which he can continue twice daily.   CKD IIIa:  Avoid nephrotoxic medications/substances like NSAIDS, IV contrast.       Acquired hypothyroidism Assessment & Plan: Check TSH, T4, continue levothyroxine  175 mcg daily. Suspected secondary to Amiodarone .   Orders: -     TSH -     T4, free  Stage 3a chronic kidney disease (HCC) Assessment & Plan: Check CMP today.   Orders: -     Comprehensive metabolic panel with GFR  Mood disorder Assessment & Plan: Patient kindly declined referral to behavioral health for counseling at this time. We will continue to reconsider and follow up on this as he may benefit from CBT in adjunct with continuing Sertraline  100 mg daily.       Return in about 4 weeks (around 10/27/2024) for Repeat A1c, CBC .  Luke Shade, MD

## 2024-09-29 NOTE — Assessment & Plan Note (Signed)
 Patient kindly declined referral to behavioral health for counseling at this time. We will continue to reconsider and follow up on this as he may benefit from CBT in adjunct with continuing Sertraline  100 mg daily.

## 2024-09-30 ENCOUNTER — Ambulatory Visit: Payer: Self-pay

## 2024-09-30 DIAGNOSIS — R945 Abnormal results of liver function studies: Secondary | ICD-10-CM | POA: Insufficient documentation

## 2024-09-30 LAB — COMPREHENSIVE METABOLIC PANEL WITH GFR
ALT: 36 U/L (ref 0–53)
AST: 38 U/L — ABNORMAL HIGH (ref 0–37)
Albumin: 4.4 g/dL (ref 3.5–5.2)
Alkaline Phosphatase: 84 U/L (ref 39–117)
BUN: 24 mg/dL — ABNORMAL HIGH (ref 6–23)
CO2: 25 meq/L (ref 19–32)
Calcium: 9 mg/dL (ref 8.4–10.5)
Chloride: 106 meq/L (ref 96–112)
Creatinine, Ser: 1.5 mg/dL (ref 0.40–1.50)
GFR: 52.33 mL/min — ABNORMAL LOW (ref >60.00)
Glucose, Bld: 113 mg/dL — ABNORMAL HIGH (ref 70–99)
Potassium: 4.2 meq/L (ref 3.5–5.1)
Sodium: 142 meq/L (ref 135–145)
Total Bilirubin: 0.6 mg/dL (ref 0.2–1.2)
Total Protein: 6.6 g/dL (ref 6.0–8.3)

## 2024-09-30 NOTE — Progress Notes (Signed)
 1. Abnormal liver function (Primary) - Hepatic function panel; Future - Gamma GT; Future  Luke Shade, MD

## 2024-10-02 ENCOUNTER — Other Ambulatory Visit: Payer: Self-pay

## 2024-10-06 ENCOUNTER — Telehealth (HOSPITAL_COMMUNITY): Payer: Self-pay

## 2024-10-06 ENCOUNTER — Telehealth: Payer: Self-pay | Admitting: Podiatry

## 2024-10-06 NOTE — Telephone Encounter (Signed)
  [facility]   Pre-operative Risk Assessment   HEARTCARE STAFF-IMPORTANT INSTRUCTIONS 1 Red and Blue Text will auto delete once note is signed or closed. 2 Press F2 to navigate through template.   3 On drop down lists, L click to select >> R click to activate next field 4 Reason for Visit format is IMPORTANT!!  See Directions on No. 2 below. 5 Please review chart to determine if there is already a clearance note open for this procedure!!  DO NOT duplicate if a note already exists!!    :1}      Request for Surgical Clearance    Procedure:  foot Surgery  Date of Surgery:  Clearance 10/09/24                                 Surgeon:  Thresa Sar Surgeon's Group or Practice Name:  [facility] Phone number:  [phone] Fax number:  [phone]   Type of Clearance Requested:   - Medical    Type of Anesthesia:  Local & IV sedation    Additional requests/questions:  Please fax a copy of Clearance to the surgeon's office.  Signed, Lisa CHRISTELLA Sergeant   10/06/2024, 9:55 AM   [facility] Harlene Gainer, FNP [facility] [address] [facility] [place] [address] [phone] (office) [phone] (fax)

## 2024-10-06 NOTE — Progress Notes (Unsigned)
 Cad Cardiology Office Note  Date:  10/07/2024   ID:  Bobby [name], DOB 12/31/1970, MRN [id number]  PCP:  Abbey Bruckner, MD   Chief Complaint  Patient presents with   Cardiac clearance     Patient having hammer toe surgery that is scheduled for Thursday, October 09, 2024. Patient c/o shortness of breath if has an increase in fluid.     HPI:  [name] is a 53 year old gentleman with past medical history of Hypotestosterone Hypothyroid, on supplement Hx of smoking CAD, inferior wall MI, stenting to RCA 2003 Catheterization July 2018, occluded RCA with collaterals, Anterior wall STEMI July 13, 2017, status post anterior wall MI 06/2017 s/p successful LAD revascularization EF 40 to 45% in July 2021, anterior, anteroseptal akinesis August 2021: Cardiac catheterization RCA: Prox in-stent CTO, previously known. Left-to-right collaterals ICD by Cindie 07/10/2024 Who presents for follow-up of his coronary artery disease, new diagnosis of atrial fibrillation in the ER June 15, 2023, s/p CABG with LIMA to the OM1 on June 25, 2023  Seen by myself in clinic 9/24 Scheduled for hammertoe surgery later this week No acute issues  Takes torsemide  20 daily sometimes 40 daily depending on shortness of breath symptoms Spiro 25 daily Goal weight 210 to 213  Underwent ICD [facility] July 10, 2024  Catheterization June 22, 2023 showing severe two-vessel coronary disease 90% ostial left circumflex, chronic total occlusion proximal RCA stent Fraction 25 to 35% Was transferred to [facility] for CABG  CORONARY ARTERY BYPASS GRAFTING (CABG) x1 USING LEFT INTERNAL MAMMARY ARTERY (LIMA) (N/A) End of June 25, 2023  08/28/23: Worsening SOB, went to the ER, elevated BNP Cardiac CTA detailing no PE  On CPAP at night  Labs: Total chol 90, LDL 31 A1C 6.9  EKG personally reviewed by myself on todays visit EKG Interpretation Date/Time:  Tuesday October 07 2024 14:20:17 EDT Ventricular Rate:  59 PR  Interval:  188 QRS Duration:  114 QT Interval:  424 QTC Calculation: 419 R Axis:   -48  Text Interpretation: Sinus bradycardia Left axis deviation Incomplete left bundle branch block T wave abnormality, consider lateral ischemia When compared with ECG of 31-Jul-2024 04:07, Inverted T waves have replaced nonspecific T wave abnormality in Lateral leads Confirmed by Perla Lye [phone]) on 10/07/2024 6:17:39 PM    Previously seen by cardiology June 08, 2022 [facility] in [place]  Several weeks ago,chest pain Helped friend move, had chest pain Stuttering since then June 21 developed severe pain concerning for angina, took nitro with short-term relief but symptoms came back Seen in the emergency room June 15, 2023 Reported having chest pain, shortness of breath Found to be in new onset atrial fibrillation Started on Eliquis  5 twice daily Converting to normal sinus rhythm in emergency room on diltiazem infusion  Blood pressure low on today's visit Unable to add long-acting nitroglycerin  Tolerating carvedilol  3.25 twice daily  Tolerating Lipitor and Zetia  with Repatha  Cholesterol at goal  remote history of stenting x2 to the RCA 2003,  Cardiac catheterization July 13, 2017 noted to have occluded RCA, as well as 100% proximal LAD disease, 80% diagonal disease, 70% OM disease collaterals from left to right  Repeat catheterization August 21, 2017, patent LAD stent, occluded proximal RCA in-stent restenosis, 50% ramus disease  hospitalized in early July 2021 with nausea, vomiting, and diarrhea. Patient underwent EGD that showed erythematous mucosa in stomach antrum, and duodenum. Biopsy was taken. Prontonix and outpatient f/u were recommended. Pathology showed no malignancy.   Cardiac testing  and imaging as below Coronary angiography 08/10/2020: LM: Normal LAD: Patent ostial LAD stent. 20% ostial diagonal stenosis LCx: 20% OM1 stenosis RCA: Prox in-stent CTO.  Left-to-left collaterals fill up to mid RCA Normal LVEDP   Echocardiogram 07/16/2020:  Poor visualization of endocardial borders.  Left ventricle cavity is  normal in size and thickness. Anterior/anteroseptal akinesis. LVEF  probably 40-45%.  Indeterminate diastolic filling pattern.  No significant valvular abnormalities.  normal right atrial pressure.  No significant change compared to previous study in 2018.   Real-time Outpatient Cardiac Telemetry 04/01/2020 - 04/15/2020: Dominant rhythm: Sinus. HR 44-150 bpm. Avg HR 72 bpm. Supraventricular ectopy, PAC/upto 6 beat run. SVE burden <1% Ventricular ectopy, PVC, NSVT upto 5 beats. VE burden <1% No atrial fibrillation/atrial flutter/SVT/high grade AV block, sinus pause >3sec noted. 12 patient activated events do not correlate with arrhthymias.    Stress perfusion MRI 11/07/2017: 1. Moderately dilated left ventricle with normal wall thickness and moderately decreased systolic function (LVEF = 43%). There is hypokinesis of the basal anteroseptal, anterior walls and akinesis of the mid inferoseptal, anteroseptal, anterior, all apical walls including true apex. A large infarct in the basal and mid inferior, inferoseptal, anteroseptal, anterior walls and in all the apical walls with no ischemia. 2. Normal right ventricular size, thickness and systolic function (LVEF = 56%). There are no regional wall motion abnormalities. 3.  Mildly dilated left atrium. 4. Mild mitral and tricuspid regurgitation. Collectively, these findings are consistent with an ischemic cardiomyopathy with a large infarct in the LAD and RCA territory with no peri-infarct ischemia. There is poor chance of recovery if Revascularized.   Cath 08/21/2017: LM: Normal LAD: Patent ostial LAD stent. 20% ostial diagonal stenosis Ramus: 50% proximal stenosis RCA: Not engaged today. Known RCA CTO   Elevated LVEDP LVEF 45%   PMH:   has a past medical history of Acute MI  anterior wall first episode care [facility]) (07/13/2017), Allergy (N/A), Anxiety (Early 20's), Arrhythmia, Cardiogenic shock (HCC) (07/13/2017), CHF (congestive heart failure) (HCC), Coronary artery disease, Depression (Early 20's), Elevated troponin (06/29/2020), Encounter for screening for HIV (05/27/2023), GERD (gastroesophageal reflux disease), Graves disease, Heart murmur (Early 1980's), Hypertension, MI (myocardial infarction) (HCC), Myalgia (05/21/2019), Nausea vomiting and diarrhea (06/29/2020), Prediabetes (05/27/2023), Sleep apnea, Thyroid  disease, Vitamin D  deficiency (05/27/2023), and Wound infection after surgery (11/16/2023).  PSH:    Past Surgical History:  Procedure Laterality Date   ABDOMINAL AORTOGRAM N/A 06/22/2023   Procedure: ABDOMINAL AORTOGRAM;  Surgeon: Mady Bruckner, MD;  Location: [facility] INVASIVE CV LAB;  Service: Cardiovascular;  Laterality: N/A;   CORONARY ANGIOPLASTY WITH STENT PLACEMENT     CORONARY ARTERY BYPASS GRAFT N/A 06/25/2023   Procedure: CORONARY ARTERY BYPASS GRAFTING (CABG) x1 USING LEFT INTERNAL MAMMARY ARTERY (LIMA);  Surgeon: Kerrin Elspeth BROCKS, MD;  Location[facility];  Service: Open Heart Surgery;  Laterality: N/A;   CORONARY/GRAFT ACUTE MI REVASCULARIZATION N/A 07/13/2017   Procedure: Coronary/Graft Acute MI Revascularization;  Surgeon: Ladona Heinz, MD;  Location: [facility] INVASIVE CV LAB;  Service: Cardiovascular;  Laterality: N/A;   ESOPHAGOGASTRODUODENOSCOPY N/A 06/30/2020   Procedure: ESOPHAGOGASTRODUODENOSCOPY (EGD);  Surgeon: Toledo, Ladell POUR, MD;  Location: [facility] ENDOSCOPY;  Service: Gastroenterology;  Laterality: N/A;   ICD IMPLANT N/A 07/30/2024   Procedure: ICD IMPLANT;  Surgeon: Cindie Ole DASEN, MD;  Location: [facility] INVASIVE CV LAB;  Service: Cardiovascular;  Laterality: N/A;   LEFT HEART CATH AND CORONARY ANGIOGRAPHY N/A 07/13/2017   Procedure: Left Heart Cath and Coronary Angiography;  Surgeon: Ladona Heinz, MD;  Location: [facility] INVASIVE CV LAB;  Service:  Cardiovascular;  Laterality: N/A;   LEFT HEART CATH AND CORONARY ANGIOGRAPHY N/A 08/21/2017   Procedure: LEFT HEART CATH AND CORONARY ANGIOGRAPHY;  Surgeon: Elmira Newman PARAS, MD;  Location: [facility] INVASIVE CV LAB;  Service: Cardiovascular;  Laterality: N/A;   LEFT HEART CATH AND CORONARY ANGIOGRAPHY N/A 08/10/2020   Procedure: LEFT HEART CATH AND CORONARY ANGIOGRAPHY;  Surgeon: Elmira Newman PARAS, MD;  Location: [facility] INVASIVE CV LAB;  Service: Cardiovascular;  Laterality: N/A;   RIGHT/LEFT HEART CATH AND CORONARY ANGIOGRAPHY N/A 06/22/2023   Procedure: RIGHT/LEFT HEART CATH AND CORONARY ANGIOGRAPHY;  Surgeon: Mady Bruckner, MD;  Location: [facility] INVASIVE CV LAB;  Service: Cardiovascular;  Laterality: N/A;   SKIN GRAFT     TEE WITHOUT CARDIOVERSION N/A 06/25/2023   Procedure: TRANSESOPHAGEAL ECHOCARDIOGRAM;  Surgeon: Kerrin Elspeth BROCKS, MD;  Location: [facility] OR;  Service: Open Heart Surgery;  Laterality: N/A;   VENTRICULAR ASSIST DEVICE INSERTION N/A 07/13/2017   Procedure: Ventricular Assist Device Insertion;  Surgeon: Ladona Heinz, MD;  Location: [facility] INVASIVE CV LAB;  Service: Cardiovascular;  Laterality: N/A;    Current Outpatient Medications  Medication Sig Dispense Refill   acetaminophen  (TYLENOL ) 325 MG tablet Take 1-2 tablets (325-650 mg total) by mouth every 4 (four) hours as needed for mild pain (pain score 1-3).     amiodarone  (PACERONE ) 200 MG tablet Take 200 mg by mouth daily.     aspirin  81 MG chewable tablet Chew 1 tablet (81 mg total) by mouth daily. 30 tablet 11   atorvastatin  (LIPITOR) 20 MG tablet Take 1 tablet (20 mg total) by mouth daily. 90 tablet 3   dapagliflozin  propanediol (FARXIGA ) 10 MG TABS tablet Take 1 tablet (10 mg total) by mouth daily before breakfast. 90 tablet 3   digoxin  (LANOXIN ) 0.125 MG tablet Take 1 tablet (0.125 mg total) by mouth daily. 30 tablet 5   Evolocumab  (REPATHA  SURECLICK) 140 MG/ML SOAJ Inject 140mg  into the skin once every 14 days. 6 mL 3   ezetimibe   (ZETIA ) 10 MG tablet Take 1 tablet (10 mg total) by mouth daily. 90 tablet 3   levothyroxine  (SYNTHROID ) 175 MCG tablet Take 1 tablet (175 mcg total) by mouth daily. 60 tablet 1   metFORMIN  (GLUCOPHAGE -XR) 500 MG 24 hr tablet Take 1 tablet (500 mg total) by mouth daily with breakfast. 90 tablet 1   metoprolol  succinate (TOPROL -XL) 25 MG 24 hr tablet Take 1 tablet (25 mg total) by mouth at bedtime. 90 tablet 3   mexiletine (MEXITIL ) 150 MG capsule Take 1 capsule (150 mg total) by mouth 2 (two) times daily. 180 capsule 1   Multiple Vitamins-Minerals (MULTIVITAMIN WITH MINERALS) tablet Take 1 tablet by mouth daily.     pantoprazole  (PROTONIX ) 20 MG tablet Take 1 tablet (20 mg total) by mouth 2 (two) times daily. 180 tablet 2   sertraline  (ZOLOFT ) 100 MG tablet Take 1 tablet (100 mg total) by mouth daily. 90 tablet 3   sildenafil  (VIAGRA ) 100 MG tablet Take 1 tablet (100 mg total) by mouth as needed for erectile dysfunction. TAKE 1 (ONE) TABLET AS NEEDED. DO NOT USE AT SAME TIME AS NITRO 30 tablet 3   spironolactone  (ALDACTONE ) 25 MG tablet Take 0.5 tablets (12.5 mg total) by mouth daily. 45 tablet 3   torsemide  (DEMADEX ) 20 MG tablet Take 1 tablet (20 mg total) by mouth daily. May also take 1 additional tablet (20 mg total) as needed for weight gain or swelling. 35 tablet 5  traZODone  (DESYREL ) 100 MG tablet Take 1 tablet (100 mg total) by mouth at bedtime as needed for sleep. 20 tablet 0   warfarin (COUMADIN ) 5 MG tablet Take 0.5-1 tablets (2.5-5 mg total) by mouth daily. Take 0.5 (2.5 mg) tablet daily until follow up 8/13     No current facility-administered medications for this visit.    Allergies:   Patient has no known allergies.   Social History:  The patient  reports that he quit smoking about 7 years ago. His smoking use included cigarettes. He started smoking about 27 years ago. He has a 10 pack-year smoking history. He has never used smokeless tobacco. He reports that he does not currently  use alcohol. He reports that he does not currently use drugs.   Family History:   family history includes Depression in his mother; Heart attack in his father; Heart disease in his paternal grandmother; Heart disease (age of onset: 64) in his father; Hypertension in his father; Valvular heart disease in his mother.    Review of Systems: Review of Systems  Constitutional: Negative.   HENT: Negative.    Respiratory: Negative.    Cardiovascular: Negative.   Gastrointestinal: Negative.   Musculoskeletal: Negative.   Neurological: Negative.   Psychiatric/Behavioral: Negative.    All other systems reviewed and are negative.   PHYSICAL EXAM: VS:  BP 120/70 (BP Location: Left Arm, Patient Position: Sitting, Cuff Size: Normal)   Pulse (!) 59   Ht 6' 1 (1.854 m)   Wt 217 lb 2 oz (98.5 kg)   SpO2 98%   BMI 28.65 kg/m  , BMI Body mass index is 28.65 kg/m. Constitutional:  oriented to person, place, and time. No distress.  HENT:  Head: Grossly normal Eyes:  no discharge. No scleral icterus.  Neck: No JVD, no carotid bruits  Cardiovascular: Regular rate and rhythm, no murmurs appreciated Pulmonary/Chest: Clear to auscultation bilaterally, no wheezes or rales Abdominal: Soft.  no distension.  no tenderness.  Musculoskeletal: Normal range of motion Neurological:  normal muscle tone. Coordination normal. No atrophy Skin: Skin warm and dry Psychiatric: normal affect, pleasant  Recent Labs: 03/24/2024: Magnesium  2.2 07/23/2024: Hemoglobin 11.5; Platelets 275 08/22/2024: B Natriuretic Peptide 291.6 09/29/2024: ALT 36; BUN 24; Creatinine, Ser 1.50; Potassium 4.2; Sodium 142; TSH 0.46    Lipid Panel Lab Results  Component Value Date   CHOL 90 08/22/2024   HDL 42 08/22/2024   LDLCALC 31 08/22/2024   TRIG 84 08/22/2024    Wt Readings from Last 3 Encounters:  10/07/24 217 lb 2 oz (98.5 kg)  09/29/24 211 lb 9.6 oz (96 kg)  09/15/24 214 lb (97.1 kg)     ASSESSMENT AND PLAN:  Problem  List Items Addressed This Visit       Cardiology Problems   Ischemic cardiomyopathy   Relevant Medications   amiodarone  (PACERONE ) 200 MG tablet   Other Relevant Orders   EKG 12-Lead (Completed)   CHF (congestive heart failure), NYHA class II, chronic, combined (HCC)   Relevant Medications   amiodarone  (PACERONE ) 200 MG tablet   Other Relevant Orders   EKG 12-Lead (Completed)   Paroxysmal atrial fibrillation (HCC) - Primary   Relevant Medications   amiodarone  (PACERONE ) 200 MG tablet   Other Relevant Orders   EKG 12-Lead (Completed)   Frequent PVCs   Relevant Medications   amiodarone  (PACERONE ) 200 MG tablet   Other Relevant Orders   EKG 12-Lead (Completed)   Coronary artery disease involving native coronary artery of  native heart with unstable angina pectoris (HCC)   Relevant Medications   amiodarone  (PACERONE ) 200 MG tablet   Other Relevant Orders   EKG 12-Lead (Completed)   HFrEF (heart failure with reduced ejection fraction) (HCC)   Relevant Medications   amiodarone  (PACERONE ) 200 MG tablet   Other Relevant Orders   EKG 12-Lead (Completed)     Other   S/P CABG x 1   Other Visit Diagnoses       Chronic HFrEF (heart failure with reduced ejection fraction) (HCC)       Relevant Medications   amiodarone  (PACERONE ) 200 MG tablet     PVC (premature ventricular contraction)       Relevant Medications   amiodarone  (PACERONE ) 200 MG tablet      Preop cardiovascular evaluation Acceptable risk for toe surgery later this week, No further cardiac testing needed  Coronary artery disease with stable angina Hx of CABG, LIMA to the OM Prior stent to the LAD, occluded RCA with collaterals left to right Continue Lipitor 20 Zetia  10 with Repatha   Ischemic cardiomyopathy/chronic systolic CHF Off low-dose carvedilol ,  Continue digoxin , metoprolol  spironolactone  Farxiga   Paroxysmal atrial fibrillation Maintaining normal sinus rhythm On metoprolol  amiodarone ,  Off Eliquis ,  on warfarin  Hyperlipidemia Numbers at goal on PCSK9 inhibitor/Repatha  , atorvastatin , Zetia   Anxiety Managed by primary care   Signed, Velinda Lunger, M.D., Ph.D. [facility] [place], [place] [phone]

## 2024-10-06 NOTE — Telephone Encounter (Signed)
 Surgical clearance faxed via Epic

## 2024-10-06 NOTE — Telephone Encounter (Signed)
 Received email from Manito at [facility] in regards to anesthesia approval. Per the pre anesthesia assessment nurse  Pt with AICD. Not suitable for other than cataract surgery at [facility].

## 2024-10-06 NOTE — H&P (View-Only) (Signed)
 [facility] Office Note  Date:  10/07/2024   ID:  Bobby [name], DOB 1971/10/16, MRN [id number]  PCP:  Abbey Bruckner, MD   Chief Complaint  Patient presents with   Cardiac clearance     Patient having hammer toe surgery that is scheduled for Thursday, October 09, 2024. Patient c/o shortness of breath if has an increase in fluid.     HPI:  [name] is a 53 year old gentleman with past medical history of Hypotestosterone Hypothyroid, on supplement Hx of smoking CAD, inferior wall MI, stenting to RCA 2003 Catheterization July 2018, occluded RCA with collaterals, Anterior wall STEMI July 13, 2017, status post anterior wall MI 06/2017 s/p successful LAD revascularization EF 40 to 45% in July 2021, anterior, anteroseptal akinesis August 2021: Cardiac catheterization RCA: Prox in-stent CTO, previously known. Left-to-right collaterals ICD by Cindie 07/10/2024 Who presents for follow-up of his coronary artery disease, new diagnosis of atrial fibrillation in the ER June 15, 2023, s/p CABG with LIMA to the OM1 on June 25, 2023  Seen by myself in clinic 9/24 Scheduled for hammertoe surgery later this week No acute issues  Takes torsemide  20 daily sometimes 40 daily depending on shortness of breath symptoms Spiro 25 daily Goal weight 210 to 213  Underwent ICD [facility] July 10, 2024  Catheterization June 22, 2023 showing severe two-vessel coronary disease 90% ostial left circumflex, chronic total occlusion proximal RCA stent Fraction 25 to 35% Was transferred to [facility] for CABG  CORONARY ARTERY BYPASS GRAFTING (CABG) x1 USING LEFT INTERNAL MAMMARY ARTERY (LIMA) (N/A) End of June 25, 2023  08/28/23: Worsening SOB, went to the ER, elevated BNP Cardiac CTA detailing no PE  On CPAP at night  Labs: Total chol 90, LDL 31 A1C 6.9  EKG personally reviewed by myself on todays visit EKG Interpretation Date/Time:  Tuesday October 07 2024 14:20:17 EDT Ventricular Rate:  59 PR  Interval:  188 QRS Duration:  114 QT Interval:  424 QTC Calculation: 419 R Axis:   -48  Text Interpretation: Sinus bradycardia Left axis deviation Incomplete left bundle branch block T wave abnormality, consider lateral ischemia When compared with ECG of 31-Jul-2024 04:07, Inverted T waves have replaced nonspecific T wave abnormality in Lateral leads Confirmed by Perla Lye [phone]) on 10/07/2024 6:17:39 PM    Previously seen by cardiology June 08, 2022 [facility] in [place]  Several weeks ago,chest pain Helped friend move, had chest pain Stuttering since then June 21 developed severe pain concerning for angina, took nitro with short-term relief but symptoms came back Seen in the emergency room June 15, 2023 Reported having chest pain, shortness of breath Found to be in new onset atrial fibrillation Started on Eliquis  5 twice daily Converting to normal sinus rhythm in emergency room on diltiazem infusion  Blood pressure low on today's visit Unable to add long-acting nitroglycerin  Tolerating carvedilol  3.25 twice daily  Tolerating Lipitor and Zetia  with Repatha  Cholesterol at goal  remote history of stenting x2 to the RCA 2003,  Cardiac catheterization July 13, 2017 noted to have occluded RCA, as well as 100% proximal LAD disease, 80% diagonal disease, 70% OM disease collaterals from left to right  Repeat catheterization August 21, 2017, patent LAD stent, occluded proximal RCA in-stent restenosis, 50% ramus disease  hospitalized in early July 2021 with nausea, vomiting, and diarrhea. Patient underwent EGD that showed erythematous mucosa in stomach antrum, and duodenum. Biopsy was taken. Prontonix and outpatient f/u were recommended. Pathology showed no malignancy.   Cardiac testing  and imaging as below Coronary angiography 08/10/2020: LM: Normal LAD: Patent ostial LAD stent. 20% ostial diagonal stenosis LCx: 20% OM1 stenosis RCA: Prox in-stent CTO.  Left-to-left collaterals fill up to mid RCA Normal LVEDP   Echocardiogram 07/16/2020:  Poor visualization of endocardial borders.  Left ventricle cavity is  normal in size and thickness. Anterior/anteroseptal akinesis. LVEF  probably 40-45%.  Indeterminate diastolic filling pattern.  No significant valvular abnormalities.  normal right atrial pressure.  No significant change compared to previous study in 2018.   Real-time Outpatient Cardiac Telemetry 04/01/2020 - 04/15/2020: Dominant rhythm: Sinus. HR 44-150 bpm. Avg HR 72 bpm. Supraventricular ectopy, PAC/upto 6 beat run. SVE burden <1% Ventricular ectopy, PVC, NSVT upto 5 beats. VE burden <1% No atrial fibrillation/atrial flutter/SVT/high grade AV block, sinus pause >3sec noted. 12 patient activated events do not correlate with arrhthymias.    Stress perfusion MRI 11/07/2017: 1. Moderately dilated left ventricle with normal wall thickness and moderately decreased systolic function (LVEF = 43%). There is hypokinesis of the basal anteroseptal, anterior walls and akinesis of the mid inferoseptal, anteroseptal, anterior, all apical walls including true apex. A large infarct in the basal and mid inferior, inferoseptal, anteroseptal, anterior walls and in all the apical walls with no ischemia. 2. Normal right ventricular size, thickness and systolic function (LVEF = 56%). There are no regional wall motion abnormalities. 3.  Mildly dilated left atrium. 4. Mild mitral and tricuspid regurgitation. Collectively, these findings are consistent with an ischemic cardiomyopathy with a large infarct in the LAD and RCA territory with no peri-infarct ischemia. There is poor chance of recovery if Revascularized.   Cath 08/21/2017: LM: Normal LAD: Patent ostial LAD stent. 20% ostial diagonal stenosis Ramus: 50% proximal stenosis RCA: Not engaged today. Known RCA CTO   Elevated LVEDP LVEF 45%   PMH:   has a past medical history of Acute MI  anterior wall first episode care [employer]) (07/13/2017), Allergy (N/A), Anxiety (Early 20's), Arrhythmia, Cardiogenic shock (HCC) (07/13/2017), CHF (congestive heart failure) (HCC), Coronary artery disease, Depression (Early 20's), Elevated troponin (06/29/2020), Encounter for screening for HIV (05/27/2023), GERD (gastroesophageal reflux disease), Graves disease, Heart murmur (Early 1980's), Hypertension, MI (myocardial infarction) (HCC), Myalgia (05/21/2019), Nausea vomiting and diarrhea (06/29/2020), Prediabetes (05/27/2023), Sleep apnea, Thyroid  disease, Vitamin D  deficiency (05/27/2023), and Wound infection after surgery (11/16/2023).  PSH:    Past Surgical History:  Procedure Laterality Date   ABDOMINAL AORTOGRAM N/A 06/22/2023   Procedure: ABDOMINAL AORTOGRAM;  Surgeon: Mady Bruckner, MD;  Location: [facility] INVASIVE CV LAB;  Service: Cardiovascular;  Laterality: N/A;   CORONARY ANGIOPLASTY WITH STENT PLACEMENT     CORONARY ARTERY BYPASS GRAFT N/A 06/25/2023   Procedure: CORONARY ARTERY BYPASS GRAFTING (CABG) x1 USING LEFT INTERNAL MAMMARY ARTERY (LIMA);  Surgeon: Kerrin Elspeth BROCKS, MD;  Location: [facility] OR;  Service: Open Heart Surgery;  Laterality: N/A;   CORONARY/GRAFT ACUTE MI REVASCULARIZATION N/A 07/13/2017   Procedure: Coronary/Graft Acute MI Revascularization;  Surgeon: Ladona Heinz, MD;  Location: [facility] INVASIVE CV LAB;  Service: Cardiovascular;  Laterality: N/A;   ESOPHAGOGASTRODUODENOSCOPY N/A 06/30/2020   Procedure: ESOPHAGOGASTRODUODENOSCOPY (EGD);  Surgeon: Toledo, Ladell POUR, MD;  Location: [facility] ENDOSCOPY;  Service: Gastroenterology;  Laterality: N/A;   ICD IMPLANT N/A 07/30/2024   Procedure: ICD IMPLANT;  Surgeon: Cindie Ole DASEN, MD;  Location: [facility] INVASIVE CV LAB;  Service: Cardiovascular;  Laterality: N/A;   LEFT HEART CATH AND CORONARY ANGIOGRAPHY N/A 07/13/2017   Procedure: Left Heart Cath and Coronary Angiography;  Surgeon: Ladona Heinz, MD;  Location: [facility] INVASIVE CV LAB;  Service:  Cardiovascular;  Laterality: N/A;   LEFT HEART CATH AND CORONARY ANGIOGRAPHY N/A 08/21/2017   Procedure: LEFT HEART CATH AND CORONARY ANGIOGRAPHY;  Surgeon: Elmira Newman PARAS, MD;  Location: [facility] INVASIVE CV LAB;  Service: Cardiovascular;  Laterality: N/A;   LEFT HEART CATH AND CORONARY ANGIOGRAPHY N/A 08/10/2020   Procedure: LEFT HEART CATH AND CORONARY ANGIOGRAPHY;  Surgeon: Elmira Newman PARAS, MD;  Location: [facility] INVASIVE CV LAB;  Service: Cardiovascular;  Laterality: N/A;   RIGHT/LEFT HEART CATH AND CORONARY ANGIOGRAPHY N/A 06/22/2023   Procedure: RIGHT/LEFT HEART CATH AND CORONARY ANGIOGRAPHY;  Surgeon: Mady Bruckner, MD;  Location: [facility] INVASIVE CV LAB;  Service: Cardiovascular;  Laterality: N/A;   SKIN GRAFT     TEE WITHOUT CARDIOVERSION N/A 06/25/2023   Procedure: TRANSESOPHAGEAL ECHOCARDIOGRAM;  Surgeon: Kerrin Elspeth BROCKS, MD;  Location: [facility] OR;  Service: Open Heart Surgery;  Laterality: N/A;   VENTRICULAR ASSIST DEVICE INSERTION N/A 07/13/2017   Procedure: Ventricular Assist Device Insertion;  Surgeon: Ladona Heinz, MD;  Location: [facility] INVASIVE CV LAB;  Service: Cardiovascular;  Laterality: N/A;    Current Outpatient Medications  Medication Sig Dispense Refill   acetaminophen  (TYLENOL ) 325 MG tablet Take 1-2 tablets (325-650 mg total) by mouth every 4 (four) hours as needed for mild pain (pain score 1-3).     amiodarone  (PACERONE ) 200 MG tablet Take 200 mg by mouth daily.     aspirin  81 MG chewable tablet Chew 1 tablet (81 mg total) by mouth daily. 30 tablet 11   atorvastatin  (LIPITOR) 20 MG tablet Take 1 tablet (20 mg total) by mouth daily. 90 tablet 3   dapagliflozin  propanediol (FARXIGA ) 10 MG TABS tablet Take 1 tablet (10 mg total) by mouth daily before breakfast. 90 tablet 3   digoxin  (LANOXIN ) 0.125 MG tablet Take 1 tablet (0.125 mg total) by mouth daily. 30 tablet 5   Evolocumab  (REPATHA  SURECLICK) 140 MG/ML SOAJ Inject 140mg  into the skin once every 14 days. 6 mL 3   ezetimibe   (ZETIA ) 10 MG tablet Take 1 tablet (10 mg total) by mouth daily. 90 tablet 3   levothyroxine  (SYNTHROID ) 175 MCG tablet Take 1 tablet (175 mcg total) by mouth daily. 60 tablet 1   metFORMIN  (GLUCOPHAGE -XR) 500 MG 24 hr tablet Take 1 tablet (500 mg total) by mouth daily with breakfast. 90 tablet 1   metoprolol  succinate (TOPROL -XL) 25 MG 24 hr tablet Take 1 tablet (25 mg total) by mouth at bedtime. 90 tablet 3   mexiletine (MEXITIL ) 150 MG capsule Take 1 capsule (150 mg total) by mouth 2 (two) times daily. 180 capsule 1   Multiple Vitamins-Minerals (MULTIVITAMIN WITH MINERALS) tablet Take 1 tablet by mouth daily.     pantoprazole  (PROTONIX ) 20 MG tablet Take 1 tablet (20 mg total) by mouth 2 (two) times daily. 180 tablet 2   sertraline  (ZOLOFT ) 100 MG tablet Take 1 tablet (100 mg total) by mouth daily. 90 tablet 3   sildenafil  (VIAGRA ) 100 MG tablet Take 1 tablet (100 mg total) by mouth as needed for erectile dysfunction. TAKE 1 (ONE) TABLET AS NEEDED. DO NOT USE AT SAME TIME AS NITRO 30 tablet 3   spironolactone  (ALDACTONE ) 25 MG tablet Take 0.5 tablets (12.5 mg total) by mouth daily. 45 tablet 3   torsemide  (DEMADEX ) 20 MG tablet Take 1 tablet (20 mg total) by mouth daily. May also take 1 additional tablet (20 mg total) as needed for weight gain or swelling. 35 tablet 5  traZODone  (DESYREL ) 100 MG tablet Take 1 tablet (100 mg total) by mouth at bedtime as needed for sleep. 20 tablet 0   warfarin (COUMADIN ) 5 MG tablet Take 0.5-1 tablets (2.5-5 mg total) by mouth daily. Take 0.5 (2.5 mg) tablet daily until follow up 8/13     No current facility-administered medications for this visit.    Allergies:   Patient has no known allergies.   Social History:  The patient  reports that he quit smoking about 7 years ago. His smoking use included cigarettes. He started smoking about 27 years ago. He has a 10 pack-year smoking history. He has never used smokeless tobacco. He reports that he does not currently  use alcohol. He reports that he does not currently use drugs.   Family History:   family history includes Depression in his mother; Heart attack in his father; Heart disease in his paternal grandmother; Heart disease (age of onset: 66) in his father; Hypertension in his father; Valvular heart disease in his mother.    Review of Systems: Review of Systems  Constitutional: Negative.   HENT: Negative.    Respiratory: Negative.    Cardiovascular: Negative.   Gastrointestinal: Negative.   Musculoskeletal: Negative.   Neurological: Negative.   Psychiatric/Behavioral: Negative.    All other systems reviewed and are negative.   PHYSICAL EXAM: VS:  BP 120/70 (BP Location: Left Arm, Patient Position: Sitting, Cuff Size: Normal)   Pulse (!) 59   Ht 6' 1 (1.854 m)   Wt 217 lb 2 oz (98.5 kg)   SpO2 98%   BMI 28.65 kg/m  , BMI Body mass index is 28.65 kg/m. Constitutional:  oriented to person, place, and time. No distress.  HENT:  Head: Grossly normal Eyes:  no discharge. No scleral icterus.  Neck: No JVD, no carotid bruits  Cardiovascular: Regular rate and rhythm, no murmurs appreciated Pulmonary/Chest: Clear to auscultation bilaterally, no wheezes or rales Abdominal: Soft.  no distension.  no tenderness.  Musculoskeletal: Normal range of motion Neurological:  normal muscle tone. Coordination normal. No atrophy Skin: Skin warm and dry Psychiatric: normal affect, pleasant  Recent Labs: 03/24/2024: Magnesium  2.2 07/23/2024: Hemoglobin 11.5; Platelets 275 08/22/2024: B Natriuretic Peptide 291.6 09/29/2024: ALT 36; BUN 24; Creatinine, Ser 1.50; Potassium 4.2; Sodium 142; TSH 0.46    Lipid Panel Lab Results  Component Value Date   CHOL 90 08/22/2024   HDL 42 08/22/2024   LDLCALC 31 08/22/2024   TRIG 84 08/22/2024    Wt Readings from Last 3 Encounters:  10/07/24 217 lb 2 oz (98.5 kg)  09/29/24 211 lb 9.6 oz (96 kg)  09/15/24 214 lb (97.1 kg)     ASSESSMENT AND PLAN:  Problem  List Items Addressed This Visit       Cardiology Problems   Ischemic cardiomyopathy   Relevant Medications   amiodarone  (PACERONE ) 200 MG tablet   Other Relevant Orders   EKG 12-Lead (Completed)   CHF (congestive heart failure), NYHA class II, chronic, combined (HCC)   Relevant Medications   amiodarone  (PACERONE ) 200 MG tablet   Other Relevant Orders   EKG 12-Lead (Completed)   Paroxysmal atrial fibrillation (HCC) - Primary   Relevant Medications   amiodarone  (PACERONE ) 200 MG tablet   Other Relevant Orders   EKG 12-Lead (Completed)   Frequent PVCs   Relevant Medications   amiodarone  (PACERONE ) 200 MG tablet   Other Relevant Orders   EKG 12-Lead (Completed)   Coronary artery disease involving native coronary artery of  native heart with unstable angina pectoris (HCC)   Relevant Medications   amiodarone  (PACERONE ) 200 MG tablet   Other Relevant Orders   EKG 12-Lead (Completed)   HFrEF (heart failure with reduced ejection fraction) (HCC)   Relevant Medications   amiodarone  (PACERONE ) 200 MG tablet   Other Relevant Orders   EKG 12-Lead (Completed)     Other   S/P CABG x 1   Other Visit Diagnoses       Chronic HFrEF (heart failure with reduced ejection fraction) (HCC)       Relevant Medications   amiodarone  (PACERONE ) 200 MG tablet     PVC (premature ventricular contraction)       Relevant Medications   amiodarone  (PACERONE ) 200 MG tablet      Preop cardiovascular evaluation Acceptable risk for toe surgery later this week, No further cardiac testing needed  Coronary artery disease with stable angina Hx of CABG, LIMA to the OM Prior stent to the LAD, occluded RCA with collaterals left to right Continue Lipitor 20 Zetia  10 with Repatha   Ischemic cardiomyopathy/chronic systolic CHF Off low-dose carvedilol ,  Continue digoxin , metoprolol  spironolactone  Farxiga   Paroxysmal atrial fibrillation Maintaining normal sinus rhythm On metoprolol  amiodarone ,  Off Eliquis ,  on warfarin  Hyperlipidemia Numbers at goal on PCSK9 inhibitor/Repatha  , atorvastatin , Zetia   Anxiety Managed by primary care   Signed, Velinda Lunger, M.D., Ph.D. [facility] [place], [place] [phone]

## 2024-10-07 ENCOUNTER — Encounter: Payer: Self-pay | Admitting: Cardiovascular Disease

## 2024-10-07 ENCOUNTER — Telehealth: Payer: Self-pay

## 2024-10-07 ENCOUNTER — Telehealth: Payer: Self-pay | Admitting: Cardiovascular Disease

## 2024-10-07 ENCOUNTER — Other Ambulatory Visit

## 2024-10-07 ENCOUNTER — Ambulatory Visit: Attending: Cardiovascular Disease | Admitting: Cardiovascular Disease

## 2024-10-07 VITALS — BP 120/70 | HR 59 | Ht 73.0 in | Wt 217.1 lb

## 2024-10-07 DIAGNOSIS — I480 Paroxysmal atrial fibrillation: Secondary | ICD-10-CM | POA: Diagnosis not present

## 2024-10-07 DIAGNOSIS — Z951 Presence of aortocoronary bypass graft: Secondary | ICD-10-CM | POA: Diagnosis not present

## 2024-10-07 DIAGNOSIS — I5022 Chronic systolic (congestive) heart failure: Secondary | ICD-10-CM

## 2024-10-07 DIAGNOSIS — I255 Ischemic cardiomyopathy: Secondary | ICD-10-CM

## 2024-10-07 DIAGNOSIS — I5042 Chronic combined systolic (congestive) and diastolic (congestive) heart failure: Secondary | ICD-10-CM | POA: Diagnosis not present

## 2024-10-07 DIAGNOSIS — I5020 Unspecified systolic (congestive) heart failure: Secondary | ICD-10-CM

## 2024-10-07 DIAGNOSIS — I25110 Atherosclerotic heart disease of native coronary artery with unstable angina pectoris: Secondary | ICD-10-CM | POA: Diagnosis not present

## 2024-10-07 DIAGNOSIS — I493 Ventricular premature depolarization: Secondary | ICD-10-CM | POA: Diagnosis not present

## 2024-10-07 NOTE — Telephone Encounter (Signed)
 Returned call to the patient, who confirmed that he is indeed scheduled for surgery on 10/09/24 and will keep his appointment today, 10/07/24, for pre-op clearance.

## 2024-10-07 NOTE — Telephone Encounter (Signed)
**  SURGERY MOVED TO [facility] 10/09/2024**  PER [facility] PORTAL, NO PRIOR AUTH IS REQUIRED FOR CPT CODE 71714. DOCUMENTATION ATTACHED TO SURGERY CONSENT PACKET.

## 2024-10-07 NOTE — Patient Instructions (Addendum)
 Medication Instructions:   No changes  Hold warfarin until after surgery  If you need a refill on your cardiac medications before your next appointment, please call your pharmacy.   Lab work: No new labs needed  Testing/Procedures: No new testing needed  Follow-Up: At [facility], you and your health needs are our priority.  As part of our continuing mission to provide you with exceptional heart care, we have created designated Provider Care Teams.  These Care Teams include your primary Cardiologist (physician) and Advanced Practice Providers (APPs -  Physician Assistants and Nurse Practitioners) who all work together to provide you with the care you need, when you need it.  You will need a follow up appointment in 6 months  Providers on your designated Care Team:   Lonni Meager, NP Bernardino Bring, PA-C Cadence [place], [place]  COVID-19 Vaccine Information can be found at: [email or web address] For questions related to vaccine distribution or appointments, please email [email or web address] or call [phone].

## 2024-10-07 NOTE — Telephone Encounter (Signed)
 Wife Rico) stated patient will not have his surgery at [facility] and wants to know if patient will still need to be seen this afternoon.

## 2024-10-07 NOTE — Telephone Encounter (Signed)
 Copied from CRM [id number]. Topic: Clinical - Medical Advice >> Oct 07, 2024 11:21 AM Anairis L wrote: Reason for CRM: Barabara from [facility] calling to get H & P faxed to her ASAP to Fax [phone].Pt surgery is schedule for 10/09/2024.

## 2024-10-08 ENCOUNTER — Encounter: Payer: Self-pay | Admitting: Cardiology

## 2024-10-08 ENCOUNTER — Encounter: Payer: Self-pay | Admitting: Podiatry

## 2024-10-08 ENCOUNTER — Encounter
Admission: RE | Admit: 2024-10-08 | Discharge: 2024-10-08 | Disposition: A | Discharge status: Home / Self Care | Source: Ambulatory Visit | Attending: Podiatry | Admitting: Podiatry

## 2024-10-08 ENCOUNTER — Ambulatory Visit

## 2024-10-08 ENCOUNTER — Other Ambulatory Visit: Payer: Self-pay

## 2024-10-08 DIAGNOSIS — Z01818 Encounter for other preprocedural examination: Secondary | ICD-10-CM

## 2024-10-08 HISTORY — DX: Ganglion, unspecified site: M67.40

## 2024-10-08 HISTORY — DX: Dyspnea, unspecified: R06.00

## 2024-10-08 HISTORY — DX: Hyperlipidemia, unspecified: E78.5

## 2024-10-08 HISTORY — DX: Deficiency of other specified B group vitamins: E53.8

## 2024-10-08 HISTORY — DX: Personal history of other diseases of the circulatory system: Z86.79

## 2024-10-08 HISTORY — DX: Hypothyroidism, unspecified: E03.9

## 2024-10-08 HISTORY — DX: Paroxysmal atrial fibrillation: I48.0

## 2024-10-08 HISTORY — DX: Ischemic cardiomyopathy: I25.5

## 2024-10-08 HISTORY — DX: Other seasonal allergic rhinitis: J30.2

## 2024-10-08 HISTORY — DX: Abnormal results of liver function studies: R94.5

## 2024-10-08 HISTORY — DX: Ventricular premature depolarization: I49.3

## 2024-10-08 HISTORY — DX: Type 2 diabetes mellitus without complications: E11.9

## 2024-10-08 HISTORY — DX: Male erectile dysfunction, unspecified: N52.9

## 2024-10-08 HISTORY — DX: Personal history of nicotine dependence: Z87.891

## 2024-10-08 HISTORY — DX: Unspecified systolic (congestive) heart failure: I50.20

## 2024-10-08 HISTORY — DX: Atherosclerosis of aorta: I70.0

## 2024-10-08 HISTORY — DX: Anemia, unspecified: D64.9

## 2024-10-08 HISTORY — DX: Long term (current) use of aspirin: Z79.82

## 2024-10-08 HISTORY — DX: Testicular hypofunction: E29.1

## 2024-10-08 HISTORY — DX: Long term (current) use of anticoagulants: Z79.01

## 2024-10-08 HISTORY — DX: Other hammer toe(s) (acquired), right foot: M20.41

## 2024-10-08 HISTORY — DX: Chronic kidney disease, stage 3a: N18.31

## 2024-10-08 HISTORY — DX: Obstructive sleep apnea (adult) (pediatric): G47.33

## 2024-10-08 HISTORY — DX: Other long term (current) drug therapy: Z79.899

## 2024-10-08 NOTE — Patient Instructions (Addendum)
 Your procedure is scheduled on:10-09-24 Thursday Report to the Registration Desk on the 1st floor of the Medical Mall.Then proceed to the 2nd floor Surgery Desk To find out your arrival time, please call [phone] between 1PM - 3PM on:10-08-24 Wednesday If your arrival time is 6:00 am, do not arrive before that time as the Medical Mall entrance doors do not open until 6:00 am.  REMEMBER: Instructions that are not followed completely may result in serious medical risk, up to and including death; or upon the discretion of your surgeon and anesthesiologist your surgery may need to be rescheduled.  Do not eat food OR drink liquids after midnight the night before surgery.  No gum chewing or hard candies.  One week prior to surgery:Stop NOW (10-08-24) Stop Anti-inflammatories (NSAIDS) such as Advil , Aleve, Ibuprofen , Motrin , Naproxen, Naprosyn and Aspirin  based products such as Excedrin, Goody's Powder, BC Powder. Stop ANY OVER THE COUNTER supplements until after surgery (Multivitamin)  You may however, continue to take Tylenol  if needed for pain up until the day of surgery.  Last dose of warfarin (COUMADIN ) was on 10-06-24 Monday  Stop metFORMIN  (GLUCOPHAGE -XR) 2 days prior to surgery-Last dose was 10-08-24   Stop dapagliflozin  propanediol (FARXIGA ) 3 days prior to surgery-Last dose was 10-08-24   Stop sildenafil  (VIAGRA ) 2 days prior to surgery-Stop NOW (10-08-24)  Continue taking all of your other prescription medications up until the day of surgery.  ON THE DAY OF SURGERY ONLY TAKE THESE MEDICATIONS WITH SIPS OF WATER: -digoxin  (LANOXIN )  -levothyroxine  (SYNTHROID )  -mexiletine (MEXITIL )  -pantoprazole  (PROTONIX )  -sertraline  (ZOLOFT )   Continue your 81 mg Aspirin  up until the day prior to surgery-Do NOT take the morning of surgery  No Alcohol for 24 hours before or after surgery.  No Smoking including e-cigarettes for 24 hours before surgery.  No chewable tobacco products  for at least 6 hours before surgery.  No nicotine patches on the day of surgery.  Do not use any recreational drugs for at least a week (preferably 2 weeks) before your surgery.  Please be advised that the combination of cocaine and anesthesia may have negative outcomes, up to and including death. If you test positive for cocaine, your surgery will be cancelled.  On the morning of surgery brush your teeth with toothpaste and water, you may rinse your mouth with mouthwash if you wish. Do not swallow any toothpaste or mouthwash.  Do not wear jewelry, make-up, hairpins, clips or nail polish.  For welded (permanent) jewelry: bracelets, anklets, waist bands, etc.  Please have this removed prior to surgery.  If it is not removed, there is a chance that hospital personnel will need to cut it off on the day of surgery.  Do not wear lotions, powders, or perfumes.   Do not shave body hair from the neck down 48 hours before surgery.  Contact lenses, hearing aids and dentures may not be worn into surgery.  Do not bring valuables to the hospital. [facility] is not responsible for any missing/lost belongings or valuables.   Bring your C-PAP to the hospital   Notify your doctor if there is any change in your medical condition (cold, fever, infection).  Wear comfortable clothing (specific to your surgery type) to the hospital.  After surgery, you can help prevent lung complications by doing breathing exercises.  Take deep breaths and cough every 1-2 hours. Your doctor may order a device called an Incentive Spirometer to help you take deep breaths. When coughing or sneezing,  hold a pillow firmly against your incision with both hands. This is called "splinting." Doing this helps protect your incision. It also decreases belly discomfort.  If you are being admitted to the hospital overnight, leave your suitcase in the car. After surgery it may be brought to your room.  In case of increased patient  census, it may be necessary for you, the patient, to continue your postoperative care in the Same Day Surgery department.  If you are being discharged the day of surgery, you will not be allowed to drive home. You will need a responsible individual to drive you home and stay with you for 24 hours after surgery.   If you are taking public transportation, you will need to have a responsible individual with you.  Please call the Pre-admissions Testing Dept. at [phone] if you have any questions about these instructions.  Surgery Visitation Policy:  Patients having surgery or a procedure may have two visitors.  Children under the age of 47 must have an adult with them who is not the patient.   Merchandiser, retail to address health-related social needs:  [email or web address]

## 2024-10-08 NOTE — Progress Notes (Signed)
 PERIOPERATIVE PRESCRIPTION FOR IMPLANTED CARDIAC DEVICE PROGRAMMING  Patient Information: Name:  Bobby [name]  DOB:  1971/01/01  MRN:  [id number]    Planned Procedure: RIGHT HAMMER TOE CORRECTION   Surgeon:  Dr. Thresa Sar, DPM  Requesting device clearance: Dorise Pereyra, FNP-C  Date of Procedure:  10/09/2024  Cautery will be used.   Please route documentation back me via [facility]L. No need to FAX report to [facility] PAT APP. I will follow up on device clearance in [facility]L.   Device Information:  Clinic EP Physician:  Dr. Ole Holts   Device Type:  Defibrillator Manufacturer and Phone #:  [employer]: [phone] Pacemaker Dependent?:  No. Date of Last Device Check:  09/16/2024  Normal Device Function?:  Yes.    Electrophysiologist's Recommendations:  Have magnet available. Provide continuous ECG monitoring when magnet is used or reprogramming is to be performed.  Procedure should not interfere with device function.  No device programming or magnet placement needed.  Per Device Clinic Standing Orders, [name], RN  10:19 AM 10/08/2024

## 2024-10-08 NOTE — Progress Notes (Addendum)
 Perioperative / Anesthesia Services  Pre-Admission Testing Clinical Review / Pre-Operative Anesthesia Consult  Date: 10/08/24  PATIENT DEMOGRAPHICS: Name: Bobby [name] DOB: [birth year] MRN:   [id number]  Note: Available PAT nursing documentation and vital signs have been reviewed. Clinical nursing staff has updated patient's PMH/PSHx, current medication list, and drug allergies/intolerances to ensure complete and comprehensive history available to assist care teams in MDM as it pertains to the aforementioned surgical procedure and anticipated anesthetic course. Extensive review of available clinical information personally performed. Nursing documentation reviewed. [place] PMH and PSHx updated with any diagnoses and/or procedures that I have knowledge of that may have been inadvertently omitted during his intake with the pre-admission testing department's nursing staff.  PLANNED SURGICAL PROCEDURE(S):   Case: [id number] Date/Time: 10/09/24 0915   Procedure: CORRECTION, HAMMER TOE (Right: Toe)   Anesthesia type: Monitor Anesthesia Care   Diagnosis: Mucoid cyst of joint [M67.40]   Pre-op diagnosis: M67.40 mycoid cyst of joint right foot   Location: [facility] OR ROOM 05 / [facility]   Surgeons: Janit Thresa HERO, DPM        CLINICAL DISCUSSION: Bobby [name] is a 53 y.o. male who is submitted for pre-surgical anesthesia review and clearance prior to him undergoing the above procedure. Patient is a Former Smoker (10 pack years; quit 06/2017). Pertinent PMH includes: CAD (s/p CABG), inferior STEMI, anterior STEMI, cardiogenic shock, ischemic cardiomyopathy with resulting HFrEF, PAF, aortic atherosclerosis, frequent PVCs, ILBBB, HTN, HLD, T2DM, Graves' disease (s/p I-131 ablation), hypothyroidism, CKD-III, dyspnea, OSAH (on nocturnal PAP therapy), GERD (on daily PPI), anemia, ED (on PDE5i), hammertoe, mucoid cyst of the joint, anxiety, depression, insomnia.  Patient is followed by  cardiology (Gollan, MD). He was last seen in the cardiology clinic on 10/07/2024; notes reviewed. At the time of his clinic visit, patient doing well overall from a cardiovascular perspective. Patient denied any chest pain, shortness of breath, PND, orthopnea, palpitations, significant peripheral edema, weakness, fatigue, vertiginous symptoms, or presyncope/syncope. Patient with a past medical history significant for cardiovascular diagnoses. Documented physical exam was grossly benign, providing no evidence of acute exacerbation and/or decompensation of the patient's known cardiovascular conditions.  Of note, complete records regarding patient's cardiovascular history unavailable for review at time of consult.  Information gathered from patient report and from notes provided by his specialty care teams.  Patient reported to have suffered an inferior wall STEMI back in 2003.  Diagnostic LEFT heart catheterization was performed revealing an occluded RCA.  PCI was performed placing overlapping 3.0 x 33 mm Cypher DES x 2 to the RCA.  Procedure yielded excellent angiographic result and TIMI-3 flow.  Patient suffered an anterior wall STEMI on 07/13/2017.  Diagnostic LEFT heart catheterization was performed revealing an occluded ostial LAD, 80% ramus intermedius, and occluded RCA at the prior stent site with collateral formation.  PCI was subsequently performed placing a 3.5 x 26 mm Onyx DES to the ostial LAD.  Procedure yielded excellent angiographic result and TIMI-3 flow.  Diagnostic RIGHT/LEFT heart catheterization was performed on 06/22/2023 revealing significant two-vessel coronary artery disease with 90% ostial LCx stenosis and a chronic total occlusion of the proximal RCA stent.  Ostium of the LCx appears to be jailed by the proximal LAD stent extending back into the distal LMCA.  Ostial/proximal LAD stent widely patent.  Hemodynamics: mean RA = 7 mmHg, mean PA = 15 mmHg.  Mean PCWP = 16 mmHg, AO saturation  = 99%, PA saturation = 71%, CO = 5.1 L/min, and  CI 2.3 L/min/m.  Given the degree and complexity of patient's coronary artery disease, patient was felt to be high risk for PCI and will require bifurcation stenting from LMCA into LM 1 with hemodynamic support.  The decision was made to transfer patient to [facility] for further evaluation and consideration of revascularization.  Patient underwent single-vessel revascularization on 06/25/2023.  LIMA-OM1 bypass graft was placed.  Long-term cardiac event monitor study performed on 04/15/2024 revealing a predominant underlying sinus rhythm with frequent PVCs that accounted for a 6.3% study burden.  There were rare PACs.  No sustained arrhythmias or prolonged pauses noted.  Following his MIs, patient with an ischemic cardiomyopathy with resulting HFrEF.  Most recent TTE was performed on 06/30/2024 revealing a severely reduced left ventricular systolic function with an EF of 25-30%.  The left ventricle demonstrated regional wall motion abnormalities.  Right ventricular size was normal with low systolic function.  RVSP 31.5 mmHg.  All transvalvular gradient is noted to be normal providing no evidence suggestive of valvular stenosis.  There was trivial mitral valve regurgitation.  Due to his ischemic cardiomyopathy, AICD placement was recommended.  Patient underwent placement of a Radiation protection practitioner EL ICD DR (SN: [id number]) device on 07/30/2024.  Device is regularly interrogated by patient's primary electrophysiology team.  Most recent interrogation was on 09/16/2024, at which time device was noted to be functioning properly.  Patient with an atrial fibrillation diagnosis; CHA2DS2-VASc Score = 4 (HFrEF, HTN, prior MI/vascular disease, T2DM).  His rate and rhythm are currently being maintained on oral digoxin  + amiodarone  + metoprolol  succinate. He is chronically anticoagulated using warfarin; reported to be compliant with therapy with no evidence or  reports of GI/GU bleeding.  Ischemic cardiomyopathy and resulting HFrEF being managed on GDMT interventions including beta-blocker (metoprolol  succinate), diuretic (torsemide ), and MRA (spironolactone ) therapies.  Patient is on atorvastatin  + ezetimibe  + PCSK9i (evolocumab ) for his HLD diagnosis and further ASCVD prevention. In the setting of known cardiovascular diagnoses, it is important note that patient is on a PDE5i medication (sildenafil ) for an erectile dysfunction diagnosis.  T2DM well-controlled on currently prescribed regimen; last HgbA1c was 6.9% when checked on 07/01/2024. In the setting of known cardiovascular diagnoses and concurrent T2DM, patient is taking an SGLT2i (empagliflozin) for added cardiovascular and renovascular protection.  Patient does have an OSAH diagnosis and is reported to be compliant with prescribed nocturnal PAP therapy. Patient is able to complete all of his  ADL/IADLs without cardiovascular limitation.  Per the DASI, patient is able to achieve at least 4 METS of physical activity without experiencing any significant degree of angina/anginal equivalent symptoms. No changes were made to his medication regimen during his visit with cardiology.  Patient scheduled to follow-up with outpatient cardiology in 6 months or sooner if needed.  Tanav Orsak is scheduled for an elective CORRECTION, HAMMER TOE (Right: Toe) on 10/09/2024 with Dr. Thresa CHRISTELLA Sar, DPM. Given patient's past medical history significant for cardiovascular diagnoses, presurgical cardiac clearance was sought by the PAT team. Per cardiology, based ACC/AHA guidelines, the patient's past medical history, and the amount of time since his last clinic visit, this patient would be at an overall ACCEPTABLE risk for the planned procedure without further cardiovascular testing or intervention at this time.   Again, this patient is on daily oral anticoagulation therapy. He has been instructed on recommendations for holding  his warfarin for 2 days prior to his procedure with plans to restart as soon as postoperative bleeding risk felt to  be minimized by his primary attending surgeon. The patient has been instructed that his last dose should be on 10/06/2024.  Patient denies previous perioperative complications with anesthesia in the past. In review his EMR, it is noted that patient underwent a general anesthetic course at [facility] ([facility]) in 06/2023 without documented complications.   MOST RECENT VITAL SIGNS:    10/07/2024    2:14 PM 09/29/2024   11:35 AM 09/15/2024    2:28 PM  Vitals with BMI  Height 6' 1 6' 1 6' 1  Weight 217 lbs 2 oz 211 lbs 10 oz 214 lbs  BMI 28.65 27.92 28.24  Systolic 120 104   Diastolic 70 60   Pulse 59 68    PROVIDERS/SPECIALISTS: NOTE: Primary physician provider listed below. Patient may have been seen by APP or partner within same practice.   PROVIDER ROLE / SPECIALTY LAST SHERLEAN Janit Thresa CHRISTELLA, DPM Podiatry (Surgeon) 09/15/2024  Abbey Bruckner, MD Primary Care Provider 09/29/2024  Perla Lye, MD Cardiology 10/07/2024   ALLERGIES: No Known Allergies  CURRENT HOME MEDICATIONS: No current facility-administered medications for this encounter.    acetaminophen  (TYLENOL ) 325 MG tablet   aspirin  81 MG chewable tablet   atorvastatin  (LIPITOR) 20 MG tablet   dapagliflozin  propanediol (FARXIGA ) 10 MG TABS tablet   digoxin  (LANOXIN ) 0.125 MG tablet   Evolocumab  (REPATHA  SURECLICK) 140 MG/ML SOAJ   ezetimibe  (ZETIA ) 10 MG tablet   levothyroxine  (SYNTHROID ) 175 MCG tablet   metFORMIN  (GLUCOPHAGE -XR) 500 MG 24 hr tablet   metoprolol  succinate (TOPROL -XL) 25 MG 24 hr tablet   mexiletine (MEXITIL ) 150 MG capsule   Multiple Vitamins-Minerals (MULTIVITAMIN WITH MINERALS) tablet   pantoprazole  (PROTONIX ) 20 MG tablet   sertraline  (ZOLOFT ) 100 MG tablet   sildenafil  (VIAGRA ) 100 MG tablet   spironolactone  (ALDACTONE ) 25 MG tablet   torsemide  (DEMADEX ) 20 MG tablet    traZODone  (DESYREL ) 100 MG tablet   warfarin (COUMADIN ) 5 MG tablet   amiodarone  (PACERONE ) 200 MG tablet   HISTORY: Past Medical History:  Diagnosis Date   Abnormal liver function    AICD (automatic cardioverter/defibrillator) present 07/30/2024   a.) [employer] EL ICD DR (SN: [id number])   Anemia    Anticoagulated on warfarin    Anxiety    Aortic atherosclerosis    B12 deficiency    Cardiogenic shock (HCC) 07/13/2017   a.) required Impella ventricular assist device implantation   CKD stage 3a, GFR 45-59 ml/min (HCC)    Coronary artery disease    Depression    DM (diabetes mellitus), type 2 (HCC)    Dyspnea    Erectile dysfunction    a.) on PDE5i (sildenafil )   Frequent PVCs    GERD (gastroesophageal reflux disease)    Graves disease    a.) s/p I-131 ablation in 2012   Hammertoe of right foot    HFrEF (heart failure with reduced ejection fraction) (HCC)    History of cardiac murmur in childhood    History of smoking    Hyperlipidemia    Hypertension    Hypogonadism male    Hypothyroidism    inferior stemi    2002 (first), 2018   Ischemic cardiomyopathy    Long term current use of amiodarone     Long-term use of aspirin  therapy    Mucoid cyst of joint    Mucoid cyst of joint    Myalgia 05/21/2019   OSA on CPAP    PAF (paroxysmal atrial fibrillation) (HCC)  Prediabetes 05/27/2023   S/P CABG x 1 06/25/2023   a.) LIMA-OM1   Seasonal allergies    ST elevation myocardial infarction (STEMI) of anterior wall (HCC) 07/13/2017   a.) LHC 07/13/2017: accluded oLAD (3.5x26 Onyx DES), 80%RI. occluded RCA at prior stent site with collaterals   ST elevation myocardial infarction (STEMI) of inferior wall (HCC) 2003   a.) LHC revealed occluded RCA --> stented with overlapping 3.0 x 33 mm Cypher DES x 2   Vitamin D  deficiency 05/27/2023   Past Surgical History:  Procedure Laterality Date   ABDOMINAL AORTOGRAM N/A 06/22/2023   Procedure: ABDOMINAL AORTOGRAM;   Surgeon: Mady Bruckner, MD;  Location: [facility] INVASIVE CV LAB;  Service: Cardiovascular;  Laterality: N/A;   CORONARY ANGIOPLASTY WITH STENT PLACEMENT Left 2003   Procedure: CORONARY ANGIOPLASTY WITH STENT PLACEMENT   CORONARY ARTERY BYPASS GRAFT N/A 06/25/2023   Procedure: CORONARY ARTERY BYPASS GRAFTING (CABG) x1 USING LEFT INTERNAL MAMMARY ARTERY (LIMA);  Surgeon: Kerrin Elspeth BROCKS, MD;  Location: [facility] OR;  Service: Open Heart Surgery;  Laterality: N/A;   CORONARY/GRAFT ACUTE MI REVASCULARIZATION N/A 07/13/2017   Procedure: Coronary/Graft Acute MI Revascularization;  Surgeon: Ladona Heinz, MD;  Location: [facility] INVASIVE CV LAB;  Service: Cardiovascular;  Laterality: N/A;   ESOPHAGOGASTRODUODENOSCOPY N/A 06/30/2020   Procedure: ESOPHAGOGASTRODUODENOSCOPY (EGD);  Surgeon: Toledo, Ladell POUR, MD;  Location: [facility] ENDOSCOPY;  Service: Gastroenterology;  Laterality: N/A;   ICD IMPLANT N/A 07/30/2024   Procedure: ICD IMPLANT;  Surgeon: Cindie Ole DASEN, MD;  Location[facility] INVASIVE CV LAB;  Service: Cardiovascular;  Laterality: N/A;   LEFT HEART CATH AND CORONARY ANGIOGRAPHY N/A 07/13/2017   Procedure: Left Heart Cath and Coronary Angiography;  Surgeon: Ladona Heinz, MD;  Location: [facility] INVASIVE CV LAB;  Service: Cardiovascular;  Laterality: N/A;   LEFT HEART CATH AND CORONARY ANGIOGRAPHY N/A 08/21/2017   Procedure: LEFT HEART CATH AND CORONARY ANGIOGRAPHY;  Surgeon: Elmira Newman PARAS, MD;  Location: [facility] INVASIVE CV LAB;  Service: Cardiovascular;  Laterality: N/A;   LEFT HEART CATH AND CORONARY ANGIOGRAPHY N/A 08/10/2020   Procedure: LEFT HEART CATH AND CORONARY ANGIOGRAPHY;  Surgeon: Elmira Newman PARAS, MD;  Location: [facility] INVASIVE CV LAB;  Service: Cardiovascular;  Laterality: N/A;   RIGHT/LEFT HEART CATH AND CORONARY ANGIOGRAPHY N/A 06/22/2023   Procedure: RIGHT/LEFT HEART CATH AND CORONARY ANGIOGRAPHY;  Surgeon: Mady Bruckner, MD;  Location: [facility] INVASIVE CV LAB;  Service: Cardiovascular;  Laterality:  N/A;   SKIN GRAFT     TEE WITHOUT CARDIOVERSION N/A 06/25/2023   Procedure: TRANSESOPHAGEAL ECHOCARDIOGRAM;  Surgeon: Kerrin Elspeth BROCKS, MD;  Location: [facility] OR;  Service: Open Heart Surgery;  Laterality: N/A;   VENTRICULAR ASSIST DEVICE INSERTION N/A 07/13/2017   Procedure: Ventricular Assist Device Insertion;  Surgeon: Ladona Heinz, MD;  Location: [facility] INVASIVE CV LAB;  Service: Cardiovascular;  Laterality: N/A;   Family History  Problem Relation Age of Onset   Valvular heart disease Mother    Depression Mother    Heart disease Father 67       CABG   Hypertension Father    Heart attack Father    Heart disease Paternal Grandmother    Social History   Tobacco Use   Smoking status: Former    Current packs/day: 0.00    Average packs/day: 0.5 packs/day for 20.0 years (10.0 ttl pk-yrs)    Types: Cigarettes    Start date: 07/13/1997    Quit date: 07/13/2017    Years since quitting: 7.2   Smokeless tobacco: Never  Tobacco comments:    quit 2012  Substance Use Topics   Alcohol use: Not Currently    Comment: occ   LABS:  Office Visit on 09/29/2024  Component Date Value Ref Range Status   TSH 09/29/2024 0.46  0.35 - 5.50 uIU/mL Final   Free T4 09/29/2024 1.54  0.60 - 1.60 ng/dL Final   Comment: Specimens from patients who are undergoing biotin therapy and /or ingesting biotin supplements may contain high levels of biotin.  The higher biotin concentration in these specimens interferes with this Free T4 assay.  Specimens that contain high levels  of biotin may cause false high results for this Free T4 assay.  Please interpret results in light of the total clinical presentation of the patient.     Sodium 09/29/2024 142  135 - 145 mEq/L Final   Potassium 09/29/2024 4.2  3.5 - 5.1 mEq/L Final   Chloride 09/29/2024 106  96 - 112 mEq/L Final   CO2 09/29/2024 25  19 - 32 mEq/L Final   Glucose, Bld 09/29/2024 113 (H)  70 - 99 mg/dL Final   BUN 89/93/7974 24 (H)  6 - 23 mg/dL Final    Creatinine, Ser 09/29/2024 1.50  0.40 - 1.50 mg/dL Final   Total Bilirubin 09/29/2024 0.6  0.2 - 1.2 mg/dL Final   Alkaline Phosphatase 09/29/2024 84  39 - 117 U/L Final   AST 09/29/2024 38 (H)  0 - 37 U/L Final   ALT 09/29/2024 36  0 - 53 U/L Final   Total Protein 09/29/2024 6.6  6.0 - 8.3 g/dL Final   Albumin  09/29/2024 4.4  3.5 - 5.2 g/dL Final   GFR 89/93/7974 52.33 (L)  >60.00 mL/min Final   Calculated using the CKD-EPI Creatinine Equation (2021)   Calcium  09/29/2024 9.0  8.4 - 10.5 mg/dL Final    ECG: Date: 89/85/7974  Time ECG obtained: 1420 PM Rate: 59 bpm Rhythm: sinus bradycardia; LBBB Axis (leads I and aVF): left Intervals: PR 188 ms. QRS 114 ms. QTc 419 ms. ST segment and T wave changes: Lateral T wave abnormality  Evidence of a possible, age undetermined, prior infarct:  No Comparison: Similar to previous tracing obtained on 07/31/2024   IMAGING / PROCEDURES: DG FOOT COMPLETE RIGHT performed on 09/15/2024 Normal osseous mineralization.  Joint spaces preserved.   No fractures or osseous irregularities noted.   Impression: Negative   LIMITED TRANSTHORACIC ECHOCARDIOGRAM performed on 06/30/2024 Difficult to visualize even with contrast, but appears severely reduced. Left ventricular ejection fraction, by estimation, is 25 to 30%. The left ventricle has severely decreased function. The left ventricle demonstrates regional wall motion abnormalities (see scoring diagram/findings for description).  Right ventricular systolic function is low normal. The right ventricular size is normal. There is normal pulmonary artery systolic pressure.  Trivial mitral valve regurgitation.  The aortic valve is tricuspid.   LONG TERM CARDIAC EVENT MONITOR STUDY performed on 04/15/2024 HR 47 - 127 bpm Average 68 bpm Frequent ventricular ectopy, 6.3%  Rare supraventricular ectopy. No atrial fibrillation.  No sustained arrhythmias.  MR CARDIAC MORPHOLOGY W WO CONTRAST performed on  03/05/2024 Moderate-severely reduced systolic function (LVEF = 31%). Subendocardial LGE in the LV mid to apical anterior and anteroseptal walls, apical walls. Small apical thrombus noted, measures about 10 x 4 mm. Moderately reduced RV function. Findings suggest ischemic cardiomyopathy with prior LAD infarct.   RIGHT/LEFT HEART CATHETERIZATION AND CORONARY ANGIOGRAPHY performed on 06/22/2023 Significant two-vessel coronary artery disease, including 90% ostial LCx stenosis (ostium appears jailed  by proximal LAD stent extending back into the distal LMCA) and chronic total occlusion of proximal RCA stent. Widely patent ostial/proximal LAD stent. Severely reduced left ventricular systolic function (LVEF 25-35%). Upper normal to mildly elevated left and right heart filling pressures (LVEDP 16 mmHg, PCWP 16 mm Hg, mean RA 7). Low normal to mildly reduced cardiac output/index (Fick CO 5.1 L/min, CI 2.3 L/min/m^2). Angiographically normal abdominal aorta, iliac arteries, and right common femoral artery. Recommendations: Transfer to [facility] for cardiac surgery consultation.  I would favor bypass of OM1 +/- OM3 and distal RCA branches.  PCI would be high risk and would require bifurcation stenting from LMCA into OM1 with hemodynamic support if the patient is not a candidate for bypass. Start heparin  infusion 2 hours after TR band removal. Consider consultation with advanced heart failure team when the patient reaches [facility] to continue optimization of HFrEF in anticipation of revascularization. Aggressive secondary prevention of coronary artery disease.    MYOCARDIAL PERFUSION IMAGING STUDY (LEXISCAN ) performed on 08/01/2022 Left ventricular function is abnormal.  EF was 41%.  End diastolic cavity size is mildly enlarged. End systolic cavity size is mildly enlarged. LV perfusion is abnormal. There is no evidence of ischemia. There is evidence of infarction. Defect 1: There is a large defect with  absent uptake present in the apical to mid anterior, anteroseptal and apex location(s) that is fixed. There is abnormal wall motion in the defect area. Consistent with infarction. There is evidence of a large LAD infarct with minimal peri-infarct ischemia. No ST deviation was noted. Findings are consistent with prior myocardial infarction.  The study is intermediate risk.  IMPRESSION AND PLAN: Marco Raper has been referred for pre-anesthesia review and clearance prior to him undergoing the planned anesthetic and procedural courses. Available labs, pertinent testing, and imaging results were personally reviewed by me in preparation for upcoming operative/procedural course. [facility] medical record has been updated following extensive record review and patient interview with PAT staff.   This patient has been appropriately cleared by cardiology with an overall ACCEPTABLE risk of patient experiencing significant perioperative cardiovascular complications. Completed perioperative prescription for cardiac device management documentation completed by primary cardiology team and placed on patient's chart for review by the surgical/anesthetic team on the day of his procedure. Electrophysiology indicating that procedure should not interfere with the planned surgical procedure. Beyond normal perioperative cardiovascular monitoring, there are no recommendations from his electrophysiology team that would prompt further discussions/recommendations from an industry representative.   Based on clinical review performed today (10/08/24), barring any significant acute changes in the patient's overall condition, it is anticipated that he will be able to proceed with the planned surgical intervention. Any acute changes in clinical condition may necessitate his procedure being postponed and/or cancelled. Patient will meet with anesthesia team (MD and/or CRNA) on the day of his procedure for preoperative  evaluation/assessment. Questions regarding anesthetic course will be fielded at that time.   Pre-surgical instructions were reviewed with the patient during his PAT appointment, and questions were fielded to satisfaction by PAT clinical staff. He has been instructed on which medications that he will need to hold prior to surgery, as well as the ones that have been deemed safe/appropriate to take on the day of his procedure. As part of the general education provided by PAT, patient made aware both verbally and in writing, that he would need to abstain from the use of any illegal substances during his perioperative course. He was advised that failure to  follow the provided instructions could necessitate case cancellation or result in serious perioperative complications up to and including death. Patient encouraged to contact PAT and/or his surgeon's office to discuss any questions or concerns that may arise prior to surgery; verbalized understanding.   Dorise Pereyra, MSN, APRN, FNP-C, CEN [facility]  Perioperative Services Nurse Practitioner Phone: [phone] Fax: [phone] 10/08/24 10:20 AM  NOTE: This note has been prepared using Dragon dictation software. Despite my best ability to proofread, there is always the potential that unintentional transcriptional errors may still occur from this process.

## 2024-10-09 ENCOUNTER — Encounter
Admission: RE | Disposition: A | Discharge status: Home / Self Care | Payer: Self-pay | Source: Home / Self Care | Attending: Podiatry

## 2024-10-09 ENCOUNTER — Ambulatory Visit: Payer: Self-pay | Admitting: Urgent Care

## 2024-10-09 ENCOUNTER — Ambulatory Visit
Admission: RE | Admit: 2024-10-09 | Discharge: 2024-10-09 | Disposition: A | Discharge status: Home / Self Care | Attending: Podiatry | Admitting: Podiatry

## 2024-10-09 ENCOUNTER — Other Ambulatory Visit: Payer: Self-pay

## 2024-10-09 ENCOUNTER — Encounter: Payer: Self-pay | Admitting: Podiatry

## 2024-10-09 ENCOUNTER — Ambulatory Visit

## 2024-10-09 DIAGNOSIS — Z87891 Personal history of nicotine dependence: Secondary | ICD-10-CM | POA: Diagnosis not present

## 2024-10-09 DIAGNOSIS — E785 Hyperlipidemia, unspecified: Secondary | ICD-10-CM | POA: Insufficient documentation

## 2024-10-09 DIAGNOSIS — I252 Old myocardial infarction: Secondary | ICD-10-CM | POA: Insufficient documentation

## 2024-10-09 DIAGNOSIS — M2041 Other hammer toe(s) (acquired), right foot: Secondary | ICD-10-CM | POA: Diagnosis not present

## 2024-10-09 DIAGNOSIS — M67471 Ganglion, right ankle and foot: Secondary | ICD-10-CM | POA: Insufficient documentation

## 2024-10-09 DIAGNOSIS — Z79890 Hormone replacement therapy (postmenopausal): Secondary | ICD-10-CM | POA: Diagnosis not present

## 2024-10-09 DIAGNOSIS — I480 Paroxysmal atrial fibrillation: Secondary | ICD-10-CM | POA: Diagnosis not present

## 2024-10-09 DIAGNOSIS — Z79899 Other long term (current) drug therapy: Secondary | ICD-10-CM | POA: Insufficient documentation

## 2024-10-09 DIAGNOSIS — Z951 Presence of aortocoronary bypass graft: Secondary | ICD-10-CM | POA: Insufficient documentation

## 2024-10-09 DIAGNOSIS — N529 Male erectile dysfunction, unspecified: Secondary | ICD-10-CM | POA: Insufficient documentation

## 2024-10-09 DIAGNOSIS — I2510 Atherosclerotic heart disease of native coronary artery without angina pectoris: Secondary | ICD-10-CM | POA: Diagnosis not present

## 2024-10-09 DIAGNOSIS — N1831 Chronic kidney disease, stage 3a: Secondary | ICD-10-CM | POA: Diagnosis not present

## 2024-10-09 DIAGNOSIS — Z7901 Long term (current) use of anticoagulants: Secondary | ICD-10-CM | POA: Diagnosis not present

## 2024-10-09 DIAGNOSIS — F32A Depression, unspecified: Secondary | ICD-10-CM | POA: Diagnosis not present

## 2024-10-09 DIAGNOSIS — I5022 Chronic systolic (congestive) heart failure: Secondary | ICD-10-CM | POA: Diagnosis not present

## 2024-10-09 DIAGNOSIS — K219 Gastro-esophageal reflux disease without esophagitis: Secondary | ICD-10-CM | POA: Insufficient documentation

## 2024-10-09 DIAGNOSIS — I255 Ischemic cardiomyopathy: Secondary | ICD-10-CM | POA: Insufficient documentation

## 2024-10-09 DIAGNOSIS — E890 Postprocedural hypothyroidism: Secondary | ICD-10-CM | POA: Diagnosis not present

## 2024-10-09 DIAGNOSIS — G4733 Obstructive sleep apnea (adult) (pediatric): Secondary | ICD-10-CM | POA: Diagnosis not present

## 2024-10-09 DIAGNOSIS — Z95810 Presence of automatic (implantable) cardiac defibrillator: Secondary | ICD-10-CM | POA: Insufficient documentation

## 2024-10-09 DIAGNOSIS — Z01818 Encounter for other preprocedural examination: Secondary | ICD-10-CM

## 2024-10-09 DIAGNOSIS — F419 Anxiety disorder, unspecified: Secondary | ICD-10-CM | POA: Diagnosis not present

## 2024-10-09 DIAGNOSIS — E1122 Type 2 diabetes mellitus with diabetic chronic kidney disease: Secondary | ICD-10-CM | POA: Diagnosis not present

## 2024-10-09 DIAGNOSIS — I700 Atherosclerosis of aorta: Secondary | ICD-10-CM | POA: Diagnosis not present

## 2024-10-09 DIAGNOSIS — Z955 Presence of coronary angioplasty implant and graft: Secondary | ICD-10-CM | POA: Insufficient documentation

## 2024-10-09 DIAGNOSIS — I493 Ventricular premature depolarization: Secondary | ICD-10-CM | POA: Diagnosis not present

## 2024-10-09 DIAGNOSIS — I130 Hypertensive heart and chronic kidney disease with heart failure and stage 1 through stage 4 chronic kidney disease, or unspecified chronic kidney disease: Secondary | ICD-10-CM | POA: Diagnosis not present

## 2024-10-09 HISTORY — DX: Insomnia, unspecified: G47.00

## 2024-10-09 HISTORY — DX: Left bundle-branch block, unspecified: I44.7

## 2024-10-09 LAB — GLUCOSE, CAPILLARY
Glucose-Capillary: 131 mg/dL — ABNORMAL HIGH (ref 70–99)
Glucose-Capillary: 120 mg/dL — ABNORMAL HIGH (ref 70–99)

## 2024-10-09 SURGERY — CORRECTION, HAMMER TOE
Anesthesia: General | Site: Toe | Laterality: Right

## 2024-10-09 MED ORDER — BUPIVACAINE HCL (PF) 0.5 % IJ SOLN
INTRAMUSCULAR | Status: AC
  Filled 2024-10-09: qty 30

## 2024-10-09 MED ORDER — SODIUM CHLORIDE 0.9 % IV SOLN: INTRAVENOUS | Status: DC

## 2024-10-09 MED ORDER — BUPIVACAINE HCL (PF) 0.5 % IJ SOLN
INTRAMUSCULAR | Status: DC | PRN
  Administered 2024-10-09: 5 mL

## 2024-10-09 MED ORDER — OXYCODONE HCL 5 MG PO TABS: 5.0000 mg | ORAL_TABLET | Freq: Once | ORAL | Status: DC | PRN

## 2024-10-09 MED ORDER — CEFAZOLIN SODIUM-DEXTROSE 2-4 GM/100ML-% IV SOLN
INTRAVENOUS | Status: AC
Start: 2024-10-09 — End: 2024-10-09
  Filled 2024-10-09: qty 100

## 2024-10-09 MED ORDER — PROPOFOL 1000 MG/100ML IV EMUL
INTRAVENOUS | Status: AC
Start: 2024-10-09 — End: 2024-10-09
  Filled 2024-10-09: qty 100

## 2024-10-09 MED ORDER — PROPOFOL 500 MG/50ML IV EMUL
INTRAVENOUS | Status: DC | PRN
  Administered 2024-10-09: 90 ug/kg/min via INTRAVENOUS

## 2024-10-09 MED ORDER — FENTANYL CITRATE (PF) 100 MCG/2ML IJ SOLN
INTRAMUSCULAR | Status: AC
  Filled 2024-10-09: qty 2

## 2024-10-09 MED ORDER — CHLORHEXIDINE GLUCONATE 0.12 % MT SOLN
15.0000 mL | Freq: Once | OROMUCOSAL | Status: AC
  Administered 2024-10-09: 15 mL via OROMUCOSAL

## 2024-10-09 MED ORDER — EPHEDRINE SULFATE-NACL 50-0.9 MG/10ML-% IV SOSY
PREFILLED_SYRINGE | INTRAVENOUS | Status: DC | PRN
  Administered 2024-10-09 (×2): 5 mg via INTRAVENOUS

## 2024-10-09 MED ORDER — MIDAZOLAM HCL 2 MG/2ML IJ SOLN
INTRAMUSCULAR | Status: AC
  Filled 2024-10-09: qty 2

## 2024-10-09 MED ORDER — LIDOCAINE HCL (PF) 2 % IJ SOLN
INTRAMUSCULAR | Status: AC
  Filled 2024-10-09: qty 5

## 2024-10-09 MED ORDER — FENTANYL CITRATE (PF) 100 MCG/2ML IJ SOLN
INTRAMUSCULAR | Status: DC | PRN
  Administered 2024-10-09 (×4): 25 ug via INTRAVENOUS

## 2024-10-09 MED ORDER — OXYCODONE HCL 5 MG/5ML PO SOLN: 5.0000 mg | Freq: Once | ORAL | Status: DC | PRN

## 2024-10-09 MED ORDER — CHLORHEXIDINE GLUCONATE 0.12 % MT SOLN
OROMUCOSAL | Status: AC
  Filled 2024-10-09: qty 15

## 2024-10-09 MED ORDER — LIDOCAINE HCL 2 % IJ SOLN: INTRAMUSCULAR | Status: DC | PRN

## 2024-10-09 MED ORDER — ONDANSETRON HCL 4 MG/2ML IJ SOLN
INTRAMUSCULAR | Status: AC
  Filled 2024-10-09: qty 2

## 2024-10-09 MED ORDER — OXYCODONE-ACETAMINOPHEN 5-325 MG PO TABS
1.0000 | ORAL_TABLET | ORAL | 0 refills | Status: DC | PRN
  Filled 2024-10-09: qty 30, 5d supply, fill #0

## 2024-10-09 MED ORDER — PROPOFOL 10 MG/ML IV BOLUS
INTRAVENOUS | Status: AC
  Filled 2024-10-09: qty 20

## 2024-10-09 MED ORDER — ONDANSETRON HCL 4 MG/2ML IJ SOLN
INTRAMUSCULAR | Status: DC | PRN
  Administered 2024-10-09: 4 mg via INTRAVENOUS

## 2024-10-09 MED ORDER — MIDAZOLAM HCL (PF) 2 MG/2ML IJ SOLN
INTRAMUSCULAR | Status: DC | PRN
  Administered 2024-10-09: 2 mg via INTRAVENOUS

## 2024-10-09 MED ORDER — LIDOCAINE HCL (PF) 1 % IJ SOLN
INTRAMUSCULAR | Status: AC
  Filled 2024-10-09: qty 30

## 2024-10-09 MED ORDER — FENTANYL CITRATE (PF) 100 MCG/2ML IJ SOLN: 25.0000 ug | INTRAMUSCULAR | Status: DC | PRN

## 2024-10-09 MED ORDER — ORAL CARE MOUTH RINSE: 15.0000 mL | Freq: Once | OROMUCOSAL | Status: AC

## 2024-10-09 MED ORDER — LIDOCAINE HCL (CARDIAC) PF 100 MG/5ML IV SOSY
PREFILLED_SYRINGE | INTRAVENOUS | Status: DC | PRN
  Administered 2024-10-09: 60 mg via INTRAVENOUS

## 2024-10-09 MED ORDER — CEFAZOLIN SODIUM-DEXTROSE 2-4 GM/100ML-% IV SOLN
2.0000 g | INTRAVENOUS | Status: AC
  Administered 2024-10-09: 2 g via INTRAVENOUS

## 2024-10-09 SURGICAL SUPPLY — 34 items
BAG COUNTER SPONGE SURGICOUNT (BAG) ×1 IMPLANT
BLADE SURG 15 STRL LF DISP TIS (BLADE) ×2 IMPLANT
BNDG ELASTIC 4INX 5YD STR LF (GAUZE/BANDAGES/DRESSINGS) ×2 IMPLANT
BNDG ESMARCH 4X12 STRL LF (GAUZE/BANDAGES/DRESSINGS) IMPLANT
BNDG GAUZE DERMACEA FLUFF 4 (GAUZE/BANDAGES/DRESSINGS) ×1 IMPLANT
CUFF TOURN SGL QUICK 18X4 (TOURNIQUET CUFF) IMPLANT
DRAPE EXTREMITY 106X87X128.5 (DRAPES) ×1 IMPLANT
DRAPE FLUOR MINI C-ARM 54X84 (DRAPES) ×1 IMPLANT
DRAPE U-SHAPE 47X51 STRL (DRAPES) ×1 IMPLANT
ELECTRODE REM PT RTRN 9FT ADLT (ELECTROSURGICAL) ×1 IMPLANT
GAUZE SPONGE 4X4 12PLY STRL (GAUZE/BANDAGES/DRESSINGS) ×1 IMPLANT
GAUZE XEROFORM 1X8 LF (GAUZE/BANDAGES/DRESSINGS) ×1 IMPLANT
GLOVE BIO SURGEON STRL SZ8 (GLOVE) IMPLANT
GLOVE BIOGEL PI IND STRL 8 (GLOVE) IMPLANT
GOWN STRL REUS W/ TWL LRG LVL3 (GOWN DISPOSABLE) ×1 IMPLANT
GOWN STRL REUS W/ TWL XL LVL3 (GOWN DISPOSABLE) ×1 IMPLANT
NDL HYPO 25X1 1.5 SAFETY (NEEDLE) ×1 IMPLANT
NDL SAFETY ECLIP 18X1.5 (MISCELLANEOUS) IMPLANT
NEEDLE HYPO 25X1 1.5 SAFETY (NEEDLE) ×1 IMPLANT
PACK EXTREMITY ARMC (MISCELLANEOUS) ×1 IMPLANT
PAD PREP OB/GYN DISP 24X41 (PERSONAL CARE ITEMS) ×1 IMPLANT
PADDING CAST BLEND 4X4 NS (MISCELLANEOUS) ×2 IMPLANT
PENCIL SMOKE EVACUATOR (MISCELLANEOUS) ×1 IMPLANT
SOLN 0.9% NACL 1000 ML (IV SOLUTION) ×1 IMPLANT
SOLN 0.9% NACL POUR BTL 1000ML (IV SOLUTION) IMPLANT
SOLUTION PREP PVP 2OZ (MISCELLANEOUS) ×2 IMPLANT
SPLINT PLASTER CAST FAST 4X15 (CAST SUPPLIES) ×1 IMPLANT
STAPLER SKIN PROX 35W (STAPLE) ×1 IMPLANT
STOCKINETTE IMPERV 14X48 (MISCELLANEOUS) IMPLANT
SUT PROLENE 4 0 PS 2 18 (SUTURE) ×1 IMPLANT
SUT VIC AB 4-0 PS2 27 (SUTURE) IMPLANT
SYR BULB EAR ULCER 3OZ GRN STR (SYRINGE) ×1 IMPLANT
SYR CONTROL 10ML LL (SYRINGE) IMPLANT
TRAP FLUID SMOKE EVACUATOR (MISCELLANEOUS) ×1 IMPLANT

## 2024-10-09 NOTE — Brief Op Note (Signed)
 10/09/2024  10:15 AM  PATIENT:  Bobby [name]  53 y.o. male  PRE-OPERATIVE DIAGNOSIS:  M67.40 mycoid cyst of joint right foot  POST-OPERATIVE DIAGNOSIS:  M67.40 mycoid cyst of joint right foot  PROCEDURE:  Procedure(s): CORRECTION, HAMMER TOE (Right)  SURGEON:  Surgeons and Role:    DEWAINE Janit Thresa CHRISTELLA, DPM - Primary  PHYSICIAN ASSISTANT:   ASSISTANTS: none   ANESTHESIA:   local + MAC  EBL:  minimal   BLOOD ADMINISTERED:none  DRAINS: none   LOCAL MEDICATIONS USED:  MARCAINE   , LIDOCAINE  , and Amount: 10 ml  SPECIMEN:  No Specimen  DISPOSITION OF SPECIMEN:  N/A  COUNTS:  YES  TOURNIQUET:  * Missing tourniquet times found for documented tourniquets in log: [id number] *  DICTATION: .[place]ama Dictation  PLAN OF CARE: Discharge to home after PACU  PATIENT DISPOSITION:  PACU - hemodynamically stable.   Delay start of Pharmacological VTE agent (>24hrs) due to surgical blood loss or risk of bleeding: no  Thresa EMERSON Janit, DPM [facility]  Dr. Thresa EMERSON Janit, DPM    [address] [place], [place] [address]                Office [phone]  Fax [phone]

## 2024-10-09 NOTE — Anesthesia Preprocedure Evaluation (Signed)
 Anesthesia Evaluation  Patient identified by MRN, date of birth, ID band Patient awake    Reviewed: Allergy & Precautions, NPO status , Patient's Chart, lab work & pertinent test results  Airway Mallampati: II  TM Distance: >3 FB Neck ROM: Full    Dental no notable dental hx. (+) Dental Advisory Given, Teeth Intact   Pulmonary neg pulmonary ROS, former smoker   Pulmonary exam normal breath sounds clear to auscultation       Cardiovascular hypertension, Pt. on home beta blockers + angina  + CAD, + Past MI, + CABG and +CHF  negative cardio ROS Normal cardiovascular exam+ Cardiac Defibrillator  Rhythm:Regular Rate:Normal  Echo 06/22/2023  1. Left ventricular ejection fraction, by estimation, is 25 to 30%. The left ventricle has severely decreased function. The left ventricle demonstrates regional wall motion abnormalities (see scoring diagram/findings for description). There is mild left ventricular hypertrophy. Left ventricular diastolic parameters are consistent with Grade I diastolic dysfunction (impaired relaxation). There is akinesis of the left ventricular, entire anterior wall and apical segment.   2. Right ventricular systolic function is mildly reduced. The right ventricular size is mildly enlarged. Tricuspid regurgitation signal is inadequate for assessing PA pressure.   3. The mitral valve was not well visualized. No evidence of mitral valve regurgitation. No evidence of mitral stenosis.   4. The aortic valve was not well visualized. Aortic valve regurgitation is not visualized. No aortic stenosis is present.      Neuro/Psych  PSYCHIATRIC DISORDERS Anxiety Depression    negative neurological ROS  negative psych ROS   GI/Hepatic negative GI ROS, Neg liver ROS,GERD  Medicated and Controlled,,  Endo/Other  negative endocrine ROSdiabetesHypothyroidism Hyperthyroidism   Renal/GU Renal disease  negative genitourinary    Musculoskeletal   Abdominal   Peds  Hematology negative hematology ROS (+)   Anesthesia Other Findings Past Medical History: No date: Abnormal liver function 07/30/2024: AICD (automatic cardioverter/defibrillator) present     Comment:  a.) Radiation protection practitioner EL ICD DR (SN: [id number]) No date: Anemia No date: Anticoagulated on warfarin No date: Anxiety No date: Aortic atherosclerosis No date: B12 deficiency 07/13/2017: Cardiogenic shock (HCC)     Comment:  a.) required Impella ventricular assist device               implantation No date: CKD stage 3a, GFR 45-59 ml/min (HCC) No date: Coronary artery disease No date: Depression No date: DM (diabetes mellitus), type 2 (HCC) No date: Dyspnea No date: Erectile dysfunction     Comment:  a.) on PDE5i (sildenafil ) No date: Frequent PVCs No date: GERD (gastroesophageal reflux disease) No date: Graves disease     Comment:  a.) s/p I-131 ablation in 2012 No date: Hammertoe of right foot No date: HFrEF (heart failure with reduced ejection fraction) (HCC) No date: History of cardiac murmur in childhood No date: History of smoking No date: Hyperlipidemia No date: Hypertension No date: Hypogonadism male No date: Hypothyroidism No date: Incomplete left bundle branch block (ILBBB) No date: Insomnia     Comment:  a.) uses trazodone  No date: Ischemic cardiomyopathy No date: Long term current use of amiodarone  No date: Long-term use of aspirin  therapy No date: Mucoid cyst of joint 05/21/2019: Myalgia No date: OSA on CPAP No date: PAF (paroxysmal atrial fibrillation) (HCC)     Comment:  a.) CHA2DS2VASc = 4 (HFrEF, HTN, prior MI/vascular               disease, T2DM) as of 10/08/2024; b.) rate/rhythm  maintained on oral digoxin  + amiodarone  + metoprolol                succinate; chronically anticoagulated with warfarin 06/25/2023: S/P CABG x 1     Comment:  a.) LIMA-OM1 No date: Seasonal allergies 07/13/2017: ST  elevation myocardial infarction (STEMI) of anterior  wall [facility])     Comment:  a.) LHC 07/13/2017: occluded oLAD (3.5x26 Onyx DES),               80%RI, occluded RCA at prior stent site with collaterals 2003: ST elevation myocardial infarction (STEMI) of inferior wall  (HCC)     Comment:  a.) LHC revealed occluded RCA --> stented with               overlapping 3.0 x 33 mm Cypher DES x 2 05/27/2023: Vitamin D  deficiency  Past Surgical History: 06/22/2023: ABDOMINAL AORTOGRAM; N/A     Comment:  Procedure: ABDOMINAL AORTOGRAM;  Surgeon: Mady Bruckner, MD;  Location: [facility] INVASIVE CV LAB;                Service: Cardiovascular;  Laterality: N/A; 2003: CORONARY ANGIOPLASTY WITH STENT PLACEMENT; Left     Comment:  Procedure: CORONARY ANGIOPLASTY WITH STENT PLACEMENT 06/25/2023: CORONARY ARTERY BYPASS GRAFT; N/A     Comment:  Procedure: CORONARY ARTERY BYPASS GRAFTING (CABG) x1               USING LEFT INTERNAL MAMMARY ARTERY (LIMA);  Surgeon:               Kerrin Elspeth BROCKS, MD;  Location: [facility] OR;  Service:               Open Heart Surgery;  Laterality: N/A; 07/13/2017: CORONARY/GRAFT ACUTE MI REVASCULARIZATION; N/A     Comment:  Procedure: Coronary/Graft Acute MI Revascularization;                Surgeon: Ladona Heinz, MD;  Location: [facility] INVASIVE CV LAB;                Service: Cardiovascular;  Laterality: N/A; 06/30/2020: ESOPHAGOGASTRODUODENOSCOPY; N/A     Comment:  Procedure: ESOPHAGOGASTRODUODENOSCOPY (EGD);  Surgeon:               Toledo, Ladell POUR, MD;  Location: [facility] ENDOSCOPY;                Service: Gastroenterology;  Laterality: N/A; 07/30/2024: ICD IMPLANT; N/A     Comment:  Procedure: ICD IMPLANT;  Surgeon: Cindie Ole DASEN,               MD;  Location: [facility] INVASIVE CV LAB;  Service:               Cardiovascular;  Laterality: N/A; 07/13/2017: LEFT HEART CATH AND CORONARY ANGIOGRAPHY; N/A     Comment:  Procedure: Left Heart Cath and Coronary Angiography;                 Surgeon: Ladona Heinz, MD;  Location: [facility] INVASIVE CV LAB;                Service: Cardiovascular;  Laterality: N/A; 08/21/2017: LEFT HEART CATH AND CORONARY ANGIOGRAPHY; N/A     Comment:  Procedure: LEFT HEART CATH AND CORONARY ANGIOGRAPHY;                Surgeon: Elmira Newman PARAS, MD;  Location: [facility] INVASIVE  CV LAB;  Service: Cardiovascular;  Laterality: N/A; 08/10/2020: LEFT HEART CATH AND CORONARY ANGIOGRAPHY; N/A     Comment:  Procedure: LEFT HEART CATH AND CORONARY ANGIOGRAPHY;                Surgeon: Elmira Newman PARAS, MD;  Location: [facility] INVASIVE              CV LAB;  Service: Cardiovascular;  Laterality: N/A; 06/22/2023: RIGHT/LEFT HEART CATH AND CORONARY ANGIOGRAPHY; N/A     Comment:  Procedure: RIGHT/LEFT HEART CATH AND CORONARY               ANGIOGRAPHY;  Surgeon: Mady Bruckner, MD;  Location:               [facility] INVASIVE CV LAB;  Service: Cardiovascular;                Laterality: N/A; No date: SKIN GRAFT 06/25/2023: TEE WITHOUT CARDIOVERSION; N/A     Comment:  Procedure: TRANSESOPHAGEAL ECHOCARDIOGRAM;  Surgeon:               Kerrin Elspeth BROCKS, MD;  Location: [facility] OR;  Service:               Open Heart Surgery;  Laterality: N/A; 07/13/2017: VENTRICULAR ASSIST DEVICE INSERTION; N/A     Comment:  Procedure: Ventricular Assist Device Insertion;                Surgeon: Ladona Heinz, MD;  Location: [facility] INVASIVE CV LAB;                Service: Cardiovascular;  Laterality: N/A;     Reproductive/Obstetrics negative OB ROS                              Anesthesia Physical Anesthesia Plan  ASA: 3  Anesthesia Plan: General   Post-op Pain Management:    Induction: Intravenous  PONV Risk Score and Plan: 2 and Ondansetron , Dexamethasone, Propofol  infusion, TIVA and Midazolam   Airway Management Planned: Natural Airway and Nasal Cannula  Additional Equipment:   Intra-op Plan:   Post-operative Plan:   Informed Consent:  I have reviewed the patients History and Physical, chart, labs and discussed the procedure including the risks, benefits and alternatives for the proposed anesthesia with the patient or authorized representative who has indicated his/her understanding and acceptance.     Dental Advisory Given  Plan Discussed with: Anesthesiologist, CRNA and Surgeon  Anesthesia Plan Comments: (Patient consented for risks of anesthesia including but not limited to:  - adverse reactions to medications - risk of airway placement if required - damage to eyes, teeth, lips or other oral mucosa - nerve damage due to positioning  - sore throat or hoarseness - Damage to heart, brain, nerves, lungs, other parts of body or loss of life  Patient voiced understanding and assent.)        Anesthesia Quick Evaluation

## 2024-10-09 NOTE — Op Note (Signed)
 OPERATIVE REPORT Patient name: Bobby [name] MRN: [id number] DOB: 1971-04-08  DOS: 10/09/24  Preop Dx: Mucoid cyst digits 2 and 4 right foot Postop Dx: same  Procedure:  1.  DIPJ arthroplasty second digit right foot 2.  DIPJ arthroplasty fourth digit right foot  Surgeon: Thresa EMERSON Sar DPM  Anesthesia: 50-50 mixture of 2% lidocaine  plain with 0.5% Marcaine plain totaling 10 mL infiltrated in the patient's right lower extremity digital block  Hemostasis: Calf tourniquet inflated to a pressure of after esmarch exsanguination   EBL: Minimal mL Materials: None Injectables: None Pathology: None  Condition: The patient tolerated the procedure and anesthesia well. No complications noted or reported   Justification for procedure: The patient is a 53 y.o. male who presents today for surgical correction of symptomatic mucoid cyst and hammertoes to the 2nd and 4th digit of the right foot. All conservative modalities of been unsuccessful in providing any sort of satisfactory alleviation of symptoms with the patient. The patient was told benefits as well as possible side effects of the surgery. The patient consented for surgical correction. The patient consent form was reviewed. All patient questions were answered. No guarantees were expressed or implied. The patient and the surgeon both signed the patient consent form with the witness present and placed in the patient's chart.   Procedure in Detail: The patient was brought to the operating room, placed in the operating table in the supine position at which time an aseptic scrub and drape were performed about the patient's respective lower extremity after anesthesia was induced as described above. Attention was then directed to the surgical area where procedure number one commenced.  Procedure #1: DIPJ arthroplasty second digit right foot Attention was directed to the second digit of the right foot where a 1 cm transverse elliptical  incision was planned and made overlying the DIPJ of the toe.  The lip skin wedge was removed and a transverse tenotomy of the EDL tendon was performed to expose the underlying DIPJ.  Capsular and surrounding soft tissue was reflected away from the articular surfaces of the joint in preparation for the ensuing arthroplasty.  An oscillating saw was utilized to resect away the cartilaginous surfaces of both the middle and distal phalanx.  Copious irrigation was utilized in preparation for primary closure.  The EDL tendon was reapproximated using 4-0 Vicryl suture followed by 4-0 Prolene suture to reapproximate superficial skin edges.  Procedure #2: DIPJ arthroplasty fourth digit right foot Procedure #2 commenced in the exact same fashion manner as procedure #1 with exception of the procedure #2 was performed to the fourth digit of the right foot  Dry sterile compressive dressings were then applied to all previously mentioned incision sites about the patient's lower extremity. The tourniquet which was used for hemostasis was deflated. All normal neurovascular responses including pink color and warmth returned all the digits of patient's lower extremity.  The patient was then transferred from the operating room to the recovery room having tolerated the procedure and anesthesia well. All vital signs are stable. After a brief stay in the recovery room the patient was readmitted to inpatient room with postoperative orders placed.     Thresa EMERSON Sar, DPM [facility]  Dr. Thresa EMERSON Sar, DPM    [address]  [place], [place] [address]                Office [phone]  Fax [phone]

## 2024-10-09 NOTE — Transfer of Care (Signed)
 Immediate Anesthesia Transfer of Care Note  Patient: Bobby [name]  Procedure(s) Performed: CORRECTION, HAMMER TOE (Right: Toe)  Patient Location: PACU  Anesthesia Type:MAC  Level of Consciousness: awake and alert   Airway & Oxygen Therapy: Patient Spontanous Breathing and Patient connected to nasal cannula oxygen  Post-op Assessment: Report given to RN and Post -op Vital signs reviewed and stable  Post vital signs: Reviewed and stable  Last Vitals:  Vitals Value Taken Time  BP 94/53 10/09/24 10:20  Temp    Pulse 50 10/09/24 10:24  Resp 19 10/09/24 10:24  SpO2 97 % 10/09/24 10:24  Vitals shown include unfiled device data.  Last Pain:  Vitals:   10/09/24 0840  TempSrc: Temporal  PainSc: 0-No pain         Complications: No notable events documented.

## 2024-10-09 NOTE — Interval H&P Note (Signed)
 History and Physical Interval Note:  10/09/2024 9:21 AM  Bobby [name]  has presented today for surgery, with the diagnosis of M67.40 mycoid cyst of joint right foot.  The various methods of treatment have been discussed with the patient and family. After consideration of risks, benefits and other options for treatment, the patient has consented to  Procedure(s): CORRECTION, HAMMER TOE (Right) as a surgical intervention.  The patient's history has been reviewed, patient examined, no change in status, stable for surgery.  I have reviewed the patient's chart and labs.  Questions were answered to the patient's satisfaction.     Thresa CHRISTELLA Sar

## 2024-10-09 NOTE — Discharge Instructions (Signed)
 Keep things clean dry and intact.  Elevate foot.  Minimal weightbearing as tolerated in the postoperative shoe.  Please see provided discharge instructions for additional details regarding postoperative protocol

## 2024-10-10 ENCOUNTER — Encounter: Payer: Self-pay | Admitting: Podiatry

## 2024-10-10 NOTE — Anesthesia Postprocedure Evaluation (Signed)
 Anesthesia Post Note  Patient: Barnell Shieh  Procedure(s) Performed: CORRECTION, HAMMER TOE (Right: Toe)  Patient location during evaluation: PACU Anesthesia Type: General Level of consciousness: awake and alert Pain management: pain level controlled Vital Signs Assessment: post-procedure vital signs reviewed and stable Respiratory status: spontaneous breathing, nonlabored ventilation, respiratory function stable and patient connected to nasal cannula oxygen Cardiovascular status: blood pressure returned to baseline and stable Postop Assessment: no apparent nausea or vomiting Anesthetic complications: no   No notable events documented.   Last Vitals:  Vitals:   10/09/24 1045 10/09/24 1113  BP: 93/60 99/66  Pulse: (!) 51 (!) 50  Resp: 17 16  Temp: (!) 36.2 C (!) 36.2 C  SpO2: 95% 97%    Last Pain:  Vitals:   10/09/24 1113  TempSrc: Temporal  PainSc: 0-No pain                 Debby Mines

## 2024-10-13 ENCOUNTER — Other Ambulatory Visit: Payer: Self-pay

## 2024-10-13 ENCOUNTER — Encounter: Payer: Self-pay | Admitting: Nurse Practitioner

## 2024-10-13 ENCOUNTER — Ambulatory Visit (INDEPENDENT_AMBULATORY_CARE_PROVIDER_SITE_OTHER): Admitting: Nurse Practitioner

## 2024-10-13 VITALS — BP 106/60 | HR 64 | Temp 97.7°F | Ht 73.0 in | Wt 212.2 lb

## 2024-10-13 DIAGNOSIS — F5101 Primary insomnia: Secondary | ICD-10-CM

## 2024-10-13 DIAGNOSIS — G4733 Obstructive sleep apnea (adult) (pediatric): Secondary | ICD-10-CM | POA: Diagnosis not present

## 2024-10-13 DIAGNOSIS — G4700 Insomnia, unspecified: Secondary | ICD-10-CM | POA: Insufficient documentation

## 2024-10-13 MED ORDER — ZOLPIDEM TARTRATE 5 MG PO TABS
5.0000 mg | ORAL_TABLET | Freq: Every evening | ORAL | 0 refills | Status: AC | PRN
  Filled 2024-10-13: qty 30, 30d supply, fill #0

## 2024-10-13 NOTE — Patient Instructions (Addendum)
 Continue to use CPAP every night, minimum of 4-6 hours a night.  Change equipment as directed. Wash your tubing with warm soap and water daily, hang to dry. Wash humidifier portion weekly. Use bottled, distilled water and change daily Be aware of reduced alertness and do not drive or operate heavy machinery if experiencing this or drowsiness.  Exercise encouraged, as tolerated. Healthy weight management discussed.  Avoid or decrease alcohol consumption and medications that make you more sleepy, if possible. Notify if persistent daytime sleepiness occurs even with consistent use of PAP therapy.  Change CPAP supplies... Every month Mask cushions and/or nasal pillows CPAP machine filters Every 3 months Mask frame (not including the headgear) CPAP tubing Every 6 months Mask headgear Chin strap (if applicable) Humidifier water tub  Trial zolpidem (Ambien) 5 mg At bedtime as needed for sleep. Take immediately before bed. Ensure you have 7-8 hours in the bed after taking. Monitor for any mood changes or changes in sleep habits. Stop and notify immediately if these occur. Do not drive or operate heavy machinery after taking. Do not take with alcohol or other sedating medications. Ensure you apply your CPAP within 5-10 minutes of taking to avoid falling asleep without it. May cause some morning grogginess or vivid dreams.   Follow up in 6 weeks with Bobby Kandise Riehle,NP to see how sleep aid is working. If symptoms do not improve or worsen, please contact office for sooner follow up or seek emergency care.

## 2024-10-13 NOTE — Assessment & Plan Note (Signed)
 Moderate OSA, on CPAP. Suboptimal compliance primarily due to ongoing insomnia. Receives benefit from use of therapy. Aware of risks of untreated OSA. Encouraged healthy weight management. Safe driving practices reviewed. Aware or proper care/use of CPAP device. Encouraged to increase usage, as able.  Patient Instructions  Continue to use CPAP every night, minimum of 4-6 hours a night.  Change equipment as directed. Wash your tubing with warm soap and water daily, hang to dry. Wash humidifier portion weekly. Use bottled, distilled water and change daily Be aware of reduced alertness and do not drive or operate heavy machinery if experiencing this or drowsiness.  Exercise encouraged, as tolerated. Healthy weight management discussed.  Avoid or decrease alcohol consumption and medications that make you more sleepy, if possible. Notify if persistent daytime sleepiness occurs even with consistent use of PAP therapy.  Change CPAP supplies... Every month Mask cushions and/or nasal pillows CPAP machine filters Every 3 months Mask frame (not including the headgear) CPAP tubing Every 6 months Mask headgear Chin strap (if applicable) Humidifier water tub  Trial zolpidem (Ambien) 5 mg At bedtime as needed for sleep. Take immediately before bed. Ensure you have 7-8 hours in the bed after taking. Monitor for any mood changes or changes in sleep habits. Stop and notify immediately if these occur. Do not drive or operate heavy machinery after taking. Do not take with alcohol or other sedating medications. Ensure you apply your CPAP within 5-10 minutes of taking to avoid falling asleep without it. May cause some morning grogginess or vivid dreams.   Follow up in 6 weeks with Bobby Palestine Mosco,NP to see how sleep aid is working. If symptoms do not improve or worsen, please contact office for sooner follow up or seek emergency care.

## 2024-10-13 NOTE — Progress Notes (Signed)
 @Patient  ID: Bobby [name], male    DOB: 09-Sep-1971, 53 y.o.   MRN: [id number]  Chief Complaint  Patient presents with   Obstructive Sleep Apnea    Using CPAP nightly, since 2019. No problems with mask or pressure.     Referring provider: Abbey Bruckner, MD  HPI: 53 year old male, former smoker referred for sleep consult. Past medical history significant for PAF on amiodarone  and coumadin , ischemic cardiomyopathy, HTN, HFrEF, CAD s/p CABG, OSA on CPAP, GERD, DM, hypothyroid, CKD, HLD, anxiety.   TEST/EVENTS:  04/01/2020 HST: AHI 26/h, SpO2 low 83%  10/13/2024: Today - sleep consult Discussed the use of AI scribe software for clinical note transcription with the patient, who gave verbal consent to proceed.  History of Present Illness Bobby [name] is a 53 year old male with sleep apnea who presents for sleep consult.  He has a history of moderate sleep apnea diagnosed in 2021 and has been on CPAP therapy since then. He uses the CPAP machine about 60% of the nights. He uses a nasal mask. He sometimes wakes up 'fighting the hose.'  He has experienced weight loss of around 17-20 lb, since his last sleep study, attributed to an open-heart surgery last year. Despite the weight loss, he feels the CPAP pressures are still appropriate and not too high.   He reports a persistent issue with insomnia since his heart surgery, describing it as a 'bad case.' He has tried melatonin and trazodone  without significant relief. He continues to take Zoloft  for anxiety, which he feels is well-controlled. He previously used Xanax  for sleep but discontinued it years ago. He also tried Lunesta  last year but does not recall its effectiveness. Not currently on any sleep aids or sedating medications. Has issues with both falling and staying asleep, which impact his CPAP usage.  No history of sleepwalking and no issues with drowsy driving. He occasionally snores without the CPAP, and his wife has noted episodes of apnea  without CPAP. No snoring with CPAP. Energy levels are decent during the day, following nights that he slept. On nights he did not sleep, feels tired the next day.     No Known Allergies  Immunization History  Administered Date(s) Administered   Influenza,inj,Quad PF,6+ Mos 11/12/2019, 11/16/2020, 11/23/2021   PFIZER(Purple Top)SARS-COV-2 Vaccination 02/28/2020, 03/20/2020   Td (Adult),5 Lf Tetanus Toxid, Preservative Free 11/16/2020   Tdap 12/20/2007   Zoster Recombinant(Shingrix) 11/23/2021, 01/24/2022    Past Medical History:  Diagnosis Date   Abnormal liver function    AICD (automatic cardioverter/defibrillator) present 07/30/2024   a.) [employer] EL ICD DR (SN: [phone])   Anemia    Anticoagulated on warfarin    Anxiety    Aortic atherosclerosis    B12 deficiency    Cardiogenic shock (HCC) 07/13/2017   a.) required Impella ventricular assist device implantation   CKD stage 3a, GFR 45-59 ml/min (HCC)    Coronary artery disease    Depression    DM (diabetes mellitus), type 2 (HCC)    Dyspnea    Erectile dysfunction    a.) on PDE5i (sildenafil )   Frequent PVCs    GERD (gastroesophageal reflux disease)    Graves disease    a.) s/p I-131 ablation in 2012   Hammertoe of right foot    HFrEF (heart failure with reduced ejection fraction) (HCC)    History of cardiac murmur in childhood    History of smoking    Hyperlipidemia    Hypertension  Hypogonadism male    Hypothyroidism    Incomplete left bundle branch block (ILBBB)    Insomnia    a.) uses trazodone    Ischemic cardiomyopathy    Long term current use of amiodarone     Long-term use of aspirin  therapy    Mucoid cyst of joint    Myalgia 05/21/2019   OSA on CPAP    PAF (paroxysmal atrial fibrillation) (HCC)    a.) CHA2DS2VASc = 4 (HFrEF, HTN, prior MI/vascular disease, T2DM) as of 10/08/2024; b.) rate/rhythm maintained on oral digoxin  + amiodarone  + metoprolol  succinate; chronically anticoagulated  with warfarin   S/P CABG x 1 06/25/2023   a.) LIMA-OM1   Seasonal allergies    ST elevation myocardial infarction (STEMI) of anterior wall (HCC) 07/13/2017   a.) LHC 07/13/2017: occluded oLAD (3.5x26 Onyx DES), 80%RI, occluded RCA at prior stent site with collaterals   ST elevation myocardial infarction (STEMI) of inferior wall (HCC) 2003   a.) LHC revealed occluded RCA --> stented with overlapping 3.0 x 33 mm Cypher DES x 2   Vitamin D  deficiency 05/27/2023    Tobacco History: Social History   Tobacco Use  Smoking Status Former   Current packs/day: 0.00   Average packs/day: 0.5 packs/day for 20.0 years (10.0 ttl pk-yrs)   Types: Cigarettes   Start date: 07/13/1997   Quit date: 07/13/2017   Years since quitting: 7.2  Smokeless Tobacco Never   Counseling given: Not Answered   Outpatient Medications Prior to Visit  Medication Sig Dispense Refill   acetaminophen  (TYLENOL ) 325 MG tablet Take 1-2 tablets (325-650 mg total) by mouth every 4 (four) hours as needed for mild pain (pain score 1-3).     amiodarone  (PACERONE ) 200 MG tablet Take 200 mg by mouth at bedtime.     aspirin  81 MG chewable tablet Chew 1 tablet (81 mg total) by mouth daily. 30 tablet 11   atorvastatin  (LIPITOR) 20 MG tablet Take 1 tablet (20 mg total) by mouth daily. 90 tablet 3   dapagliflozin  propanediol (FARXIGA ) 10 MG TABS tablet Take 1 tablet (10 mg total) by mouth daily before breakfast. 90 tablet 3   digoxin  (LANOXIN ) 0.125 MG tablet Take 1 tablet (0.125 mg total) by mouth daily. (Patient taking differently: Take 0.125 mg by mouth every morning.) 30 tablet 5   Evolocumab  (REPATHA  SURECLICK) 140 MG/ML SOAJ Inject 140mg  into the skin once every 14 days. 6 mL 3   ezetimibe  (ZETIA ) 10 MG tablet Take 1 tablet (10 mg total) by mouth daily. 90 tablet 3   levothyroxine  (SYNTHROID ) 175 MCG tablet Take 1 tablet (175 mcg total) by mouth daily. (Patient taking differently: Take 175 mcg by mouth daily before breakfast.) 60  tablet 1   metFORMIN  (GLUCOPHAGE -XR) 500 MG 24 hr tablet Take 1 tablet (500 mg total) by mouth daily with breakfast. 90 tablet 1   metoprolol  succinate (TOPROL -XL) 25 MG 24 hr tablet Take 1 tablet (25 mg total) by mouth at bedtime. 90 tablet 3   mexiletine (MEXITIL ) 150 MG capsule Take 1 capsule (150 mg total) by mouth 2 (two) times daily. 180 capsule 1   Multiple Vitamins-Minerals (MULTIVITAMIN WITH MINERALS) tablet Take 1 tablet by mouth daily.     pantoprazole  (PROTONIX ) 20 MG tablet Take 1 tablet (20 mg total) by mouth 2 (two) times daily. 180 tablet 2   sertraline  (ZOLOFT ) 100 MG tablet Take 1 tablet (100 mg total) by mouth daily. (Patient taking differently: Take 100 mg by mouth every morning.) 90 tablet  3   sildenafil  (VIAGRA ) 100 MG tablet Take 1 tablet (100 mg total) by mouth as needed for erectile dysfunction. TAKE 1 (ONE) TABLET AS NEEDED. DO NOT USE AT SAME TIME AS NITRO 30 tablet 3   spironolactone  (ALDACTONE ) 25 MG tablet Take 0.5 tablets (12.5 mg total) by mouth daily. 45 tablet 3   torsemide  (DEMADEX ) 20 MG tablet Take 1 tablet (20 mg total) by mouth daily. May also take 1 additional tablet (20 mg total) as needed for weight gain or swelling. 35 tablet 5   traZODone  (DESYREL ) 100 MG tablet Take 1 tablet (100 mg total) by mouth at bedtime as needed for sleep. 20 tablet 0   warfarin (COUMADIN ) 5 MG tablet Take 0.5-1 tablets (2.5-5 mg total) by mouth daily. Take 0.5 (2.5 mg) tablet daily until follow up 8/13     oxyCODONE -acetaminophen  (PERCOCET) 5-325 MG tablet Take 1 tablet by mouth every 4 (four) hours as needed for severe pain (pain score 7-10). 30 tablet 0   No facility-administered medications prior to visit.     Review of Systems: as above    Physical Exam:  BP 106/60   Pulse 64   Temp 97.7 F (36.5 C) (Temporal)   Ht 6' 1 (1.854 m)   Wt 212 lb 3.2 oz (96.3 kg)   SpO2 96%   BMI 28.00 kg/m   GEN: Pleasant, interactive, well-appearing; in no acute distress HEENT:   Normocephalic and atraumatic. PERRLA. Sclera white. Nasal turbinates pink, moist and patent bilaterally. No rhinorrhea present. Oropharynx pink and moist, without exudate or edema. No lesions, ulcerations, or postnasal drip. Mallampati II NECK:  Supple w/ fair ROM. No lymphadenopathy.   CV: RRR, no m/r/g, no peripheral edema. Pulses intact, +2 bilaterally. No cyanosis, pallor or clubbing. PULMONARY:  Unlabored, regular breathing. Clear bilaterally A&P w/o wheezes/rales/rhonchi. No accessory muscle use.  GI: BS present and normoactive. Soft, non-tender to palpation.  MSK: No erythema, warmth or tenderness.  Neuro: A/Ox3. No focal deficits noted.   Skin: Warm, no lesions or rashe Psych: Normal affect and behavior. Judgement and thought content appropriate.     Lab Results:  CBC    Component Value Date/Time   WBC 9.8 07/23/2024 1058   WBC 8.9 07/01/2024 1541   RBC 4.48 07/23/2024 1058   RBC 4.76 07/01/2024 1541   HGB 11.5 (L) 07/23/2024 1058   HCT 37.8 07/23/2024 1058   PLT 275 07/23/2024 1058   MCV 84 07/23/2024 1058   MCH 25.7 (L) 07/23/2024 1058   MCH 31.0 08/28/2023 1005   MCHC 30.4 (L) 07/23/2024 1058   MCHC 31.9 07/01/2024 1541   RDW 15.8 (H) 07/23/2024 1058   LYMPHSABS 1.7 07/01/2024 1541   LYMPHSABS 2.1 03/16/2020 0732   MONOABS 0.8 07/01/2024 1541   EOSABS 0.2 07/01/2024 1541   EOSABS 0.2 03/16/2020 0732   BASOSABS 0.2 (H) 07/01/2024 1541   BASOSABS 0.1 03/16/2020 0732    BMET    Component Value Date/Time   NA 142 09/29/2024 1207   NA 141 07/23/2024 1058   K 4.2 09/29/2024 1207   CL 106 09/29/2024 1207   CO2 25 09/29/2024 1207   GLUCOSE 113 (H) 09/29/2024 1207   BUN 24 (H) 09/29/2024 1207   BUN 25 (H) 07/23/2024 1058   CREATININE 1.50 09/29/2024 1207   CALCIUM  9.0 09/29/2024 1207   GFRNONAA >60 08/22/2024 1305   GFRAA 70 01/06/2021 1341    BNP    Component Value Date/Time   BNP 291.6 (H) 08/22/2024  1305     Imaging:  DG MINI C-ARM IMAGE  ONLY Result Date: 10/09/2024 There is no interpretation for this exam.  This order is for images obtained during a surgical procedure.  Please See Surgeries Tab for more information regarding the procedure.   CUP PACEART REMOTE DEVICE CHECK Result Date: 09/17/2024 ICD Scheduled remote reviewed. Normal device function.  Presenting rhythm: AS/VS HL=12 Next remote 91 days. LA, CVRS  DG Foot Complete Right Result Date: 09/15/2024 Please see detailed radiograph report in office note.   Administration History     None           No data to display          No results found for: NITRICOXIDE      Assessment & Plan:   OSA (obstructive sleep apnea) Moderate OSA, on CPAP. Suboptimal compliance primarily due to ongoing insomnia. Receives benefit from use of therapy. Aware of risks of untreated OSA. Encouraged healthy weight management. Safe driving practices reviewed. Aware or proper care/use of CPAP device. Encouraged to increase usage, as able.  Patient Instructions  Continue to use CPAP every night, minimum of 4-6 hours a night.  Change equipment as directed. Wash your tubing with warm soap and water daily, hang to dry. Wash humidifier portion weekly. Use bottled, distilled water and change daily Be aware of reduced alertness and do not drive or operate heavy machinery if experiencing this or drowsiness.  Exercise encouraged, as tolerated. Healthy weight management discussed.  Avoid or decrease alcohol consumption and medications that make you more sleepy, if possible. Notify if persistent daytime sleepiness occurs even with consistent use of PAP therapy.  Change CPAP supplies... Every month Mask cushions and/or nasal pillows CPAP machine filters Every 3 months Mask frame (not including the headgear) CPAP tubing Every 6 months Mask headgear Chin strap (if applicable) Humidifier water tub  Trial zolpidem (Ambien) 5 mg At bedtime as needed for sleep. Take immediately  before bed. Ensure you have 7-8 hours in the bed after taking. Monitor for any mood changes or changes in sleep habits. Stop and notify immediately if these occur. Do not drive or operate heavy machinery after taking. Do not take with alcohol or other sedating medications. Ensure you apply your CPAP within 5-10 minutes of taking to avoid falling asleep without it. May cause some morning grogginess or vivid dreams.   Follow up in 6 weeks with Katie Smith Potenza,NP to see how sleep aid is working. If symptoms do not improve or worsen, please contact office for sooner follow up or seek emergency care.    Insomnia Persistent insomniac symptoms despite CPAP use. Sleep hygiene reviewed. Will trial on zolpidem and reassess response. Side effect/safety profile reviewed. Aware to monitor and notify of any adverse reactions.    Advised if symptoms do not improve or worsen, to please contact office for sooner follow up or seek emergency care.   I spent 45 minutes of dedicated to the care of this patient on the date of this encounter to include pre-visit review of records, face-to-face time with the patient discussing conditions above, post visit ordering of testing, clinical documentation with the electronic health record, making appropriate referrals as documented, and communicating necessary findings to members of the patients care team.  Comer LULLA Rouleau, NP 10/13/2024  Pt aware and understands NP's role.

## 2024-10-13 NOTE — Assessment & Plan Note (Signed)
 Persistent insomniac symptoms despite CPAP use. Sleep hygiene reviewed. Will trial on zolpidem and reassess response. Side effect/safety profile reviewed. Aware to monitor and notify of any adverse reactions.

## 2024-10-15 ENCOUNTER — Other Ambulatory Visit: Payer: Self-pay

## 2024-10-15 ENCOUNTER — Ambulatory Visit (INDEPENDENT_AMBULATORY_CARE_PROVIDER_SITE_OTHER)

## 2024-10-15 ENCOUNTER — Ambulatory Visit (INDEPENDENT_AMBULATORY_CARE_PROVIDER_SITE_OTHER): Admitting: Podiatry

## 2024-10-15 VITALS — BP 108/60 | HR 56 | Temp 97.6°F

## 2024-10-15 DIAGNOSIS — M6740 Ganglion, unspecified site: Secondary | ICD-10-CM | POA: Diagnosis not present

## 2024-10-15 DIAGNOSIS — Z9889 Other specified postprocedural states: Secondary | ICD-10-CM

## 2024-10-15 NOTE — Patient Instructions (Signed)
 Wound Care Keep the surgical site clean and dry. Change dressings as instructed by your surgeon. Avoid soaking the foot until wound is fully healed unless instructed otherwise by your surgeon. Watch for signs of infection: increased redness, swelling, warmth, discharge or foul odor. Contact our office immediately if you notice signs of infection or if you develop a fever. Temp over 100.4.  2.   Pain Management Take prescribed pain medications as directed by surgeon. Over-the-counter pain relievers (e.g. acetaminophen  or ibuprofen) may be used if you are not allergic, are able to take them, and are approved by your surgeon.  Elevate your foot above heart level to help reduce swelling and pain. Apply ice packs for 15-20 minutes every 2-3 hours during the first 48 hours and as needed. AVOID DIRECT CONTACT WITH SKIN 3.    Activity and Mobility Follow weight-bearing restrictions carefully (non-weight bearing, partial weight-bearing or full weight-bearing) as instructed.  Use crutches, walker or other assistive devices as recommended. Avoid strenuous activities until cleared by your surgeon.  Gradually increase your activity level as advised.  4.    Footwear Wear any special post-operative shoe or boot that has been provided or recommended.  Avoid tight or ill-fitting shoes until the foot is fully healed. Do not walk barefoot. 5.    Follow-up Appointments Attend all scheduled follow-ups for wound checks, suture/staple removals and progress evaluation.  Notify your surgeon if you experience persistent pain, numbness or unusual symptoms.  6.    Signs to Watch For (When to Call Your Doctor) Severe pain not relieved by pain medication. Excessive swelling or bleeding. Signs of infection. Numbness or tingling. Difficulty breathing or chest pain (Call 911 or seek emergency medical care)

## 2024-10-15 NOTE — Progress Notes (Signed)
 Patient presents for post-op visit today, POV # 1 DOS 10/09/24 RT 2ND AND 4TH TOE ARTHROPLASTY  Doing okay. Some soreness Thursday afternoon. Started feeling better Sunday. Half a pain pill Sunday morning. No more pain pills until yesterday evening. Went to a concert and was walking more..  RN Notes: n/a  Vital Signs: Today's Vitals   10/15/24 1514  BP: 108/60  Pulse: (!) 56  Temp: 97.6 F (36.4 C)  TempSrc: Oral  PainSc: 3   PainLoc: Foot      Radiographs: [x]  Taken []  Not taken  Surgical Site Assessment:  - Dressing:  [x]  Minimal dry blood, intact []  Reinforced   [x]  Changed     -RN Notes: patient did change dressing due to it moving.  - Incision:  [x]  CDI (clean, dry, intact)  [x]  Mild erythema  []  Drainage noted   -RN Notes: incisional erythema  - Swelling:  []  None  [x]  Mild  []  Moderate   []  Significant     -RN Notes: n/a  - Bruising:  [x]  None  []  Present: n/a   - Sutures/Staples:  []  None [x]  Intact  []  Removed Today  [x]  Plan to remove at next visit   -Cast/Splint/Pins: [x]  None []  Intact []  Removed Today []  Plan to remove at next visit []  Replaced  -Signs of infection:  [x]  None  []  Present - Describe: n/a  -DME:    []  None []  AFW [x]  Surgical shoe []  Cast  []  Splint  -Walking status:  [x]  Full WB  []  Partial WB  []  NWB  -Utilizing device:  [x]  None []  Knee Scooter []  Crutches []  Wheelchair    DVT assessment:  [x]  Denies symptoms []  Chest pain/SOB []  Pain in calf/redness/warmth   Redressed DSD and ace wrap. Educated on signs of infection, proper dressing care, pain management, and weight bearing status. Patient will contact provider with any new or worsening symptoms. The provider assessed the patient today and reviewed instructions regarding plan of care.

## 2024-10-15 NOTE — Progress Notes (Signed)
 Chief Complaint  Patient presents with   Post-op Follow-up    POV # 1 DOS 10/09/24 RT 2ND AND 4TH TOE ARTHROPLASTY  Doing okay. Some soreness Thursday afternoon. Started feeling better Sunday. Half a pain pill Sunday morning. No more pain pills until yesterday evening. Went to a concert and was walking more.    Subjective:  Patient presents today status post DIPJ arthroplasty of the right 2nd and 4th digit.  DOS: 10/09/2024.  Doing well.  WBAT surgical shoe  Past Medical History:  Diagnosis Date   Abnormal liver function    AICD (automatic cardioverter/defibrillator) present 07/30/2024   a.) [employer] EL ICD DR (SN: [phone])   Anemia    Anticoagulated on warfarin    Anxiety    Aortic atherosclerosis    B12 deficiency    Cardiogenic shock (HCC) 07/13/2017   a.) required Impella ventricular assist device implantation   CKD stage 3a, GFR 45-59 ml/min (HCC)    Coronary artery disease    Depression    DM (diabetes mellitus), type 2 (HCC)    Dyspnea    Erectile dysfunction    a.) on PDE5i (sildenafil )   Frequent PVCs    GERD (gastroesophageal reflux disease)    Graves disease    a.) s/p I-131 ablation in 2012   Hammertoe of right foot    HFrEF (heart failure with reduced ejection fraction) (HCC)    History of cardiac murmur in childhood    History of smoking    Hyperlipidemia    Hypertension    Hypogonadism male    Hypothyroidism    Incomplete left bundle branch block (ILBBB)    Insomnia    a.) uses trazodone    Ischemic cardiomyopathy    Long term current use of amiodarone     Long-term use of aspirin  therapy    Mucoid cyst of joint    Myalgia 05/21/2019   OSA on CPAP    PAF (paroxysmal atrial fibrillation) (HCC)    a.) CHA2DS2VASc = 4 (HFrEF, HTN, prior MI/vascular disease, T2DM) as of 10/08/2024; b.) rate/rhythm maintained on oral digoxin  + amiodarone  + metoprolol  succinate; chronically anticoagulated with warfarin   S/P CABG x 1 06/25/2023   a.)  LIMA-OM1   Seasonal allergies    ST elevation myocardial infarction (STEMI) of anterior wall (HCC) 07/13/2017   a.) LHC 07/13/2017: occluded oLAD (3.5x26 Onyx DES), 80%RI, occluded RCA at prior stent site with collaterals   ST elevation myocardial infarction (STEMI) of inferior wall (HCC) 2003   a.) LHC revealed occluded RCA --> stented with overlapping 3.0 x 33 mm Cypher DES x 2   Vitamin D  deficiency 05/27/2023    Past Surgical History:  Procedure Laterality Date   ABDOMINAL AORTOGRAM N/A 06/22/2023   Procedure: ABDOMINAL AORTOGRAM;  Surgeon: Mady Bruckner, MD;  Location: [facility] INVASIVE CV LAB;  Service: Cardiovascular;  Laterality: N/A;   CORONARY ANGIOPLASTY WITH STENT PLACEMENT Left 2003   Procedure: CORONARY ANGIOPLASTY WITH STENT PLACEMENT   CORONARY ARTERY BYPASS GRAFT N/A 06/25/2023   Procedure: CORONARY ARTERY BYPASS GRAFTING (CABG) x1 USING LEFT INTERNAL MAMMARY ARTERY (LIMA);  Surgeon: Kerrin Elspeth BROCKS, MD;  Location: [facility] OR;  Service: Open Heart Surgery;  Laterality: N/A;   CORONARY/GRAFT ACUTE MI REVASCULARIZATION N/A 07/13/2017   Procedure: Coronary/Graft Acute MI Revascularization;  Surgeon: Ladona Heinz, MD;  Location: [facility] INVASIVE CV LAB;  Service: Cardiovascular;  Laterality: N/A;   ESOPHAGOGASTRODUODENOSCOPY N/A 06/30/2020   Procedure: ESOPHAGOGASTRODUODENOSCOPY (EGD);  Surgeon: Aundria, Teodoro K, MD;  Location: [facility] ENDOSCOPY;  Service: Gastroenterology;  Laterality: N/A;   HAMMER TOE SURGERY Right 10/09/2024   Procedure: CORRECTION, HAMMER TOE;  Surgeon: Janit Thresa HERO, DPM;  Location: [facility] ORS;  Service: Orthopedics/Podiatry;  Laterality: Right;   ICD IMPLANT N/A 07/30/2024   Procedure: ICD IMPLANT;  Surgeon: Cindie Ole DASEN, MD;  Location: [facility] INVASIVE CV LAB;  Service: Cardiovascular;  Laterality: N/A;   LEFT HEART CATH AND CORONARY ANGIOGRAPHY N/A 07/13/2017   Procedure: Left Heart Cath and Coronary Angiography;  Surgeon: Ladona Heinz, MD;  Location: [facility] INVASIVE  CV LAB;  Service: Cardiovascular;  Laterality: N/A;   LEFT HEART CATH AND CORONARY ANGIOGRAPHY N/A 08/21/2017   Procedure: LEFT HEART CATH AND CORONARY ANGIOGRAPHY;  Surgeon: Elmira Newman PARAS, MD;  Location: [facility] INVASIVE CV LAB;  Service: Cardiovascular;  Laterality: N/A;   LEFT HEART CATH AND CORONARY ANGIOGRAPHY N/A 08/10/2020   Procedure: LEFT HEART CATH AND CORONARY ANGIOGRAPHY;  Surgeon: Elmira Newman PARAS, MD;  Location: [facility] INVASIVE CV LAB;  Service: Cardiovascular;  Laterality: N/A;   RIGHT/LEFT HEART CATH AND CORONARY ANGIOGRAPHY N/A 06/22/2023   Procedure: RIGHT/LEFT HEART CATH AND CORONARY ANGIOGRAPHY;  Surgeon: Mady Bruckner, MD;  Location: [facility] INVASIVE CV LAB;  Service: Cardiovascular;  Laterality: N/A;   SKIN GRAFT     TEE WITHOUT CARDIOVERSION N/A 06/25/2023   Procedure: TRANSESOPHAGEAL ECHOCARDIOGRAM;  Surgeon: Kerrin Elspeth BROCKS, MD;  Location: [facility] OR;  Service: Open Heart Surgery;  Laterality: N/A;   VENTRICULAR ASSIST DEVICE INSERTION N/A 07/13/2017   Procedure: Ventricular Assist Device Insertion;  Surgeon: Ladona Heinz, MD;  Location: [facility] INVASIVE CV LAB;  Service: Cardiovascular;  Laterality: N/A;    No Known Allergies  Objective/Physical Exam Neurovascular status intact.  Incision well coapted with sutures intact. No sign of infectious process noted. No dehiscence. No active bleeding noted.  There is a moderate edema noted to the toes Radiographic Exam RT foot 10/15/2024:  Arthroplasty noted to the DIPJ of the 2nd and 4th digit right foot without complication  Assessment: 1. s/p DIPJ arthroplasty 2nd and 4th digit right. DOS: 10/09/2024   Plan of Care:  -Patient was evaluated. X-rays reviewed - Okay to wash and shower and get the foot wet.  Recommend triple antibiotic and a Band-Aid overlying the incision and sutures -Continue WBAT surgical shoe -Return to clinic 1 week suture removal   Thresa EMERSON Janit, DPM [facility]  Dr. Thresa EMERSON Janit,  DPM    [address] [place], [place] [address]                Office [phone]  Fax [phone]

## 2024-10-19 ENCOUNTER — Other Ambulatory Visit: Payer: Self-pay

## 2024-10-20 ENCOUNTER — Other Ambulatory Visit: Payer: Self-pay

## 2024-10-22 ENCOUNTER — Ambulatory Visit (INDEPENDENT_AMBULATORY_CARE_PROVIDER_SITE_OTHER): Admitting: Podiatry

## 2024-10-22 DIAGNOSIS — M6740 Ganglion, unspecified site: Secondary | ICD-10-CM

## 2024-10-22 NOTE — Progress Notes (Addendum)
 Patient presents for post-op visit today, POV # 2 DOS 10/09/24 RT 2ND AND 4TH TOE ARTHROPLASTY  Doing okay since last visit. Seems to be doing okay.  RN Notes: Much improved after last week.   Vital Signs: Today's Vitals   10/22/24 1442  PainSc: 0-No pain      Radiographs: []  Taken [x]  Not taken  Surgical Site Assessment:  - Dressing:  [x]  Minimal dry blood, intact []  Reinforced   []  Changed     -RN Notes: adhesive bandages over each surgical site.   - Incision:  [x]  CDI (clean, dry, intact)  [x]  Mild erythema  []  Drainage noted   -RN Notes: n/a  - Swelling:  []  None  [x]  Mild  []  Moderate   []  Significant     -RN Notes: n/a  - Bruising:  [x]  None  []  Present: n/a   - Sutures/Staples:  []  None [x]  Intact  [x]  Removed Today  []  Plan to remove at next visit   -Cast/Splint/Pins: [x]  None []  Intact []  Removed Today []  Plan to remove at next visit []  Replaced  -Signs of infection:  [x]  None  []  Present - Describe: n/a  -DME:    []  None []  AFW [x]  Surgical shoe []  Cast  []  Splint  -Walking status:  [x]  Full WB  []  Partial WB  []  NWB  -Utilizing device:  [x]  None []  Knee Scooter []  Crutches []  Wheelchair    DVT assessment:  [x]  Denies symptoms []  Chest pain/SOB []  Pain in calf/redness/warmth   Redressed DSD and ace wrap. Educated on signs of infection, proper dressing care, pain management, and weight bearing status. Patient will contact provider with any new or worsening symptoms. The provider assessed the patient today and reviewed instructions regarding plan of care.

## 2024-10-22 NOTE — Progress Notes (Signed)
 Chief Complaint  Patient presents with   Post-op Follow-up    POV # 2 DOS 10/09/24 RT 2ND AND 4TH TOE ARTHROPLASTY  Doing okay since last visit. Seems to be doing okay.    Subjective:  Patient presents today status post DIPJ arthroplasty of the right 2nd and 4th digit.  DOS: 10/09/2024.  Doing well.  WBAT surgical shoe  Past Medical History:  Diagnosis Date   Abnormal liver function    AICD (automatic cardioverter/defibrillator) present 07/30/2024   a.) [employer] EL ICD DR (SN: [id number])   Anemia    Anticoagulated on warfarin    Anxiety    Aortic atherosclerosis    B12 deficiency    Cardiogenic shock (HCC) 07/13/2017   a.) required Impella ventricular assist device implantation   CKD stage 3a, GFR 45-59 ml/min (HCC)    Coronary artery disease    Depression    DM (diabetes mellitus), type 2 (HCC)    Dyspnea    Erectile dysfunction    a.) on PDE5i (sildenafil )   Frequent PVCs    GERD (gastroesophageal reflux disease)    Graves disease    a.) s/p I-131 ablation in 2012   Hammertoe of right foot    HFrEF (heart failure with reduced ejection fraction) (HCC)    History of cardiac murmur in childhood    History of smoking    Hyperlipidemia    Hypertension    Hypogonadism male    Hypothyroidism    Incomplete left bundle branch block (ILBBB)    Insomnia    a.) uses trazodone    Ischemic cardiomyopathy    Long term current use of amiodarone     Long-term use of aspirin  therapy    Mucoid cyst of joint    Myalgia 05/21/2019   OSA on CPAP    PAF (paroxysmal atrial fibrillation) (HCC)    a.) CHA2DS2VASc = 4 (HFrEF, HTN, prior MI/vascular disease, T2DM) as of 10/08/2024; b.) rate/rhythm maintained on oral digoxin  + amiodarone  + metoprolol  succinate; chronically anticoagulated with warfarin   S/P CABG x 1 06/25/2023   a.) LIMA-OM1   Seasonal allergies    ST elevation myocardial infarction (STEMI) of anterior wall (HCC) 07/13/2017   a.) LHC 07/13/2017:  occluded oLAD (3.5x26 Onyx DES), 80%RI, occluded RCA at prior stent site with collaterals   ST elevation myocardial infarction (STEMI) of inferior wall (HCC) 2003   a.) LHC revealed occluded RCA --> stented with overlapping 3.0 x 33 mm Cypher DES x 2   Vitamin D  deficiency 05/27/2023    Past Surgical History:  Procedure Laterality Date   ABDOMINAL AORTOGRAM N/A 06/22/2023   Procedure: ABDOMINAL AORTOGRAM;  Surgeon: Mady Bruckner, MD;  Location: [facility] INVASIVE CV LAB;  Service: Cardiovascular;  Laterality: N/A;   CORONARY ANGIOPLASTY WITH STENT PLACEMENT Left 2003   Procedure: CORONARY ANGIOPLASTY WITH STENT PLACEMENT   CORONARY ARTERY BYPASS GRAFT N/A 06/25/2023   Procedure: CORONARY ARTERY BYPASS GRAFTING (CABG) x1 USING LEFT INTERNAL MAMMARY ARTERY (LIMA);  Surgeon: Kerrin Elspeth BROCKS, MD;  Location: [facility] OR;  Service: Open Heart Surgery;  Laterality: N/A;   CORONARY/GRAFT ACUTE MI REVASCULARIZATION N/A 07/13/2017   Procedure: Coronary/Graft Acute MI Revascularization;  Surgeon: Ladona Heinz, MD;  Location: [facility] INVASIVE CV LAB;  Service: Cardiovascular;  Laterality: N/A;   ESOPHAGOGASTRODUODENOSCOPY N/A 06/30/2020   Procedure: ESOPHAGOGASTRODUODENOSCOPY (EGD);  Surgeon: Toledo, Ladell POUR, MD;  Location: [facility] ENDOSCOPY;  Service: Gastroenterology;  Laterality: N/A;   HAMMER TOE SURGERY Right 10/09/2024   Procedure: CORRECTION, HAMMER  TOE;  Surgeon: Janit Thresa HERO, DPM;  Location: [facility] ORS;  Service: Orthopedics/Podiatry;  Laterality: Right;   ICD IMPLANT N/A 07/30/2024   Procedure: ICD IMPLANT;  Surgeon: Cindie Ole DASEN, MD;  Location: [facility] INVASIVE CV LAB;  Service: Cardiovascular;  Laterality: N/A;   LEFT HEART CATH AND CORONARY ANGIOGRAPHY N/A 07/13/2017   Procedure: Left Heart Cath and Coronary Angiography;  Surgeon: Ladona Heinz, MD;  Location: [facility] INVASIVE CV LAB;  Service: Cardiovascular;  Laterality: N/A;   LEFT HEART CATH AND CORONARY ANGIOGRAPHY N/A 08/21/2017   Procedure: LEFT  HEART CATH AND CORONARY ANGIOGRAPHY;  Surgeon: Elmira Newman PARAS, MD;  Location: [facility] INVASIVE CV LAB;  Service: Cardiovascular;  Laterality: N/A;   LEFT HEART CATH AND CORONARY ANGIOGRAPHY N/A 08/10/2020   Procedure: LEFT HEART CATH AND CORONARY ANGIOGRAPHY;  Surgeon: Elmira Newman PARAS, MD;  Location: [facility] INVASIVE CV LAB;  Service: Cardiovascular;  Laterality: N/A;   RIGHT/LEFT HEART CATH AND CORONARY ANGIOGRAPHY N/A 06/22/2023   Procedure: RIGHT/LEFT HEART CATH AND CORONARY ANGIOGRAPHY;  Surgeon: Mady Bruckner, MD;  Location: [facility] INVASIVE CV LAB;  Service: Cardiovascular;  Laterality: N/A;   SKIN GRAFT     TEE WITHOUT CARDIOVERSION N/A 06/25/2023   Procedure: TRANSESOPHAGEAL ECHOCARDIOGRAM;  Surgeon: Kerrin Elspeth BROCKS, MD;  Location: [facility] OR;  Service: Open Heart Surgery;  Laterality: N/A;   VENTRICULAR ASSIST DEVICE INSERTION N/A 07/13/2017   Procedure: Ventricular Assist Device Insertion;  Surgeon: Ladona Heinz, MD;  Location: [facility] INVASIVE CV LAB;  Service: Cardiovascular;  Laterality: N/A;    No Known Allergies  Objective/Physical Exam Neurovascular status intact.  Incision well coapted with sutures intact. No sign of infectious process noted. No dehiscence. No active bleeding noted.  Improved edema noted to the toes  Radiographic Exam RT foot 10/15/2024:  Arthroplasty noted to the DIPJ of the 2nd and 4th digit right foot without complication  Assessment: 1. s/p DIPJ arthroplasty 2nd and 4th digit right. DOS: 10/09/2024   Plan of Care:  -Patient was evaluated.  -Sutures removed -Beginning next week okay to transition out of the postop shoe into good supportive tennis shoes and sneakers -Slowly increase activity over the next week back to normal activity -Return to clinic 4 weeks follow-up x-ray   Thresa EMERSON Janit, DPM [facility]  Dr. Thresa EMERSON Janit, DPM    [address] [place], [place] [address]                Office  [phone]  Fax [phone]

## 2024-10-27 ENCOUNTER — Ambulatory Visit

## 2024-10-27 ENCOUNTER — Other Ambulatory Visit: Payer: Self-pay

## 2024-10-27 VITALS — BP 94/60 | HR 62 | Temp 98.3°F | Ht 73.0 in | Wt 213.0 lb

## 2024-10-27 DIAGNOSIS — Z7984 Long term (current) use of oral hypoglycemic drugs: Secondary | ICD-10-CM | POA: Diagnosis not present

## 2024-10-27 DIAGNOSIS — Z0000 Encounter for general adult medical examination without abnormal findings: Secondary | ICD-10-CM

## 2024-10-27 DIAGNOSIS — E1169 Type 2 diabetes mellitus with other specified complication: Secondary | ICD-10-CM

## 2024-10-27 DIAGNOSIS — E119 Type 2 diabetes mellitus without complications: Secondary | ICD-10-CM | POA: Insufficient documentation

## 2024-10-27 DIAGNOSIS — R140 Abdominal distension (gaseous): Secondary | ICD-10-CM

## 2024-10-27 DIAGNOSIS — R945 Abnormal results of liver function studies: Secondary | ICD-10-CM

## 2024-10-27 LAB — MICROALBUMIN / CREATININE URINE RATIO
Creatinine,U: 62.8 mg/dL
Microalb Creat Ratio: UNDETERMINED mg/g (ref 0.0–30.0)
Microalb, Ur: <0.7 mg/dL

## 2024-10-27 NOTE — Assessment & Plan Note (Signed)
 B/L feet inspected., normal diabetic foot exam.

## 2024-10-27 NOTE — Assessment & Plan Note (Addendum)
 Colorectal cancer screening: Colonoscopy up-to-date, has a h/o colon polyp and is recommended to have a repeat colonoscopy.  Due  in 02/2027 (Dr. Josepha). Recommend pneumonia 20, qualifies for hepatitis B vaccine. Patient politely declined.   Recommend annual diabetic eye exam.

## 2024-10-27 NOTE — Assessment & Plan Note (Addendum)
 Started after recent right foot surgery on 10/09/24. Has been gradually getting bette. Last BM on 10/27/24.  Recommend Miralax 1 capful daily for 7 days in a row. Bland diet. Protonix  which he is taking 20 mg daily at baseline to be increased to 2 tab in the morning and 2 tab in the evening for about a week. Activities as tolerated. Patient to update us  on symptoms, if acute worsening abdominal pain, nausea, vomiting recommend ED evaluation. If gradual improvement reduce dose of Pantoprazole  to 20 mg once a day. If symptoms persists recommend getting KUB. Patient will reach out to us  with symptoms update.

## 2024-10-27 NOTE — Assessment & Plan Note (Addendum)
 Mildly elevated AST on 09/29/24, repeat hepatic function, GGT today. Physical exam reassuring. If LFT continues to be elevated consider RUQ ultrasounds.   Orders:   Hepatic function panel   Gamma GT

## 2024-10-27 NOTE — Assessment & Plan Note (Signed)
 Chronic, difficulty falling asleep. Taking prn Trazodone  with feeling groggy in the morning. OSA managed by pulmonology. Recently prescribed for Ambien for short course to help with insomnia. Patient has not started this yet and prefers not to start it unless absolutely necessary.

## 2024-10-27 NOTE — Progress Notes (Signed)
 Established Patient Office Visit   Subjective  Patient ID: Bobby [name], male    DOB: [birth year]  Age: 53 y.o. MRN: [id number]  Chief Complaint  Patient presents with   Annual Exam    Discussed the use of AI scribe software for clinical note transcription with the patient, who gave verbal consent to proceed.  History of Present Illness Bobby [name] is a 53 year old male who presents  for annual physical exam.   Patient reports since he underwent Right foot mucoid cyst removal surgery on 10/09/24, he has been experiencing constipation, bloating, attributed to the use of oxycodone  for postoperative pain management. He had not had a bowel movement for four to five days until the previous night, which was uncomfortable. Despite having a bowel movement, he continues to feel full and bloated. He has GERD and is on Protonix  20 mg daily.  He has been taking Miralax, a capful daily, for the past three days to alleviate the constipation, but it has not been very effective.   He has h/o OSA, compliance with CPAP.  Significant cardiovascular history including CAD needing stents placement/CABG,  paroxymal atrial fibrillation, ICD placement on 07/10/2024.    No recent changes in energy or mood. No swelling in legs, chest pain, shortness of breath, nausea, or vomiting. He feels 'puffy' and not at his best today. Typically sleeps 7-8 hours per night but has been trying to manage sleep issues without medication.    ROS As per HPI    Objective:     BP 94/60 (BP Location: Right Arm, Patient Position: Sitting, Cuff Size: Normal)   Pulse 62   Temp 98.3 F (36.8 C) (Oral)   Ht 6' 1 (1.854 m)   Wt 213 lb (96.6 kg)   SpO2 95%   BMI 28.10 kg/m      10/27/2024    1:11 PM 09/29/2024   11:37 AM 07/01/2024    3:06 PM  Depression screen PHQ 2/9  Decreased Interest 1 2 1   Down, Depressed, Hopeless 0 0 0  PHQ - 2 Score 1 2 1   Altered sleeping 2 2 3   Tired, decreased energy 3 3 2   Change in appetite  0 0 1  Feeling bad or failure about yourself  0 0 0  Trouble concentrating 3 3 2   Moving slowly or fidgety/restless 0 0 0  Suicidal thoughts 0 0 0  PHQ-9 Score 9 10 9   Difficult doing work/chores Somewhat difficult Somewhat difficult Very difficult      10/27/2024    1:11 PM 09/29/2024   11:37 AM 07/01/2024    3:06 PM 04/29/2024   12:57 PM  GAD 7 : Generalized Anxiety Score  Nervous, Anxious, on Edge 2 0 2 1  Control/stop worrying 0 0 0 0  Worry too much - different things 0 0 0 1  Trouble relaxing 3 3 3 1   Restless 3 3 3 2   Easily annoyed or irritable 1 0 1 1  Afraid - awful might happen 0 0 0 0  Total GAD 7 Score 9 6 9 6   Anxiety Difficulty Somewhat difficult Somewhat difficult Somewhat difficult Somewhat difficult      10/27/2024    1:11 PM 09/29/2024   11:37 AM 07/01/2024    3:06 PM  Depression screen PHQ 2/9  Decreased Interest 1 2 1   Down, Depressed, Hopeless 0 0 0  PHQ - 2 Score 1 2 1   Altered sleeping 2 2 3   Tired, decreased energy 3 3  2  Change in appetite 0 0 1  Feeling bad or failure about yourself  0 0 0  Trouble concentrating 3 3 2   Moving slowly or fidgety/restless 0 0 0  Suicidal thoughts 0 0 0  PHQ-9 Score 9 10 9   Difficult doing work/chores Somewhat difficult Somewhat difficult Very difficult      10/27/2024    1:11 PM 09/29/2024   11:37 AM 07/01/2024    3:06 PM 04/29/2024   12:57 PM  GAD 7 : Generalized Anxiety Score  Nervous, Anxious, on Edge 2 0 2 1  Control/stop worrying 0 0 0 0  Worry too much - different things 0 0 0 1  Trouble relaxing 3 3 3 1   Restless 3 3 3 2   Easily annoyed or irritable 1 0 1 1  Afraid - awful might happen 0 0 0 0  Total GAD 7 Score 9 6 9 6   Anxiety Difficulty Somewhat difficult Somewhat difficult Somewhat difficult Somewhat difficult   SDOH Screenings   Food Insecurity: Patient Declined (10/26/2024)  Housing: Patient Declined (10/26/2024)  Transportation Needs: Patient Declined (10/26/2024)  Utilities: Not At Risk (07/30/2024)   Alcohol Screen: Low Risk  (10/26/2024)  Depression (PHQ2-9): Medium Risk (10/27/2024)  Financial Resource Strain: Patient Declined (10/26/2024)  Physical Activity: Sufficiently Active (10/26/2024)  Social Connections: Unknown (10/26/2024)  Stress: Stress Concern Present (10/26/2024)  Tobacco Use: Medium Risk (10/27/2024)     Physical Exam Constitutional:      General: He is not in acute distress.    Appearance: Normal appearance.  HENT:     Head: Normocephalic and atraumatic.     Right Ear: Tympanic membrane normal.     Left Ear: Tympanic membrane normal.     Mouth/Throat:     Mouth: Mucous membranes are moist.  Neck:     Thyroid : No thyroid  mass or thyroid  tenderness.  Cardiovascular:     Rate and Rhythm: Normal rate and regular rhythm.  Pulmonary:     Effort: Pulmonary effort is normal.     Breath sounds: Normal breath sounds.  Abdominal:     General: Bowel sounds are normal.     Palpations: Abdomen is soft.     Tenderness: There is no abdominal tenderness. There is no guarding or rebound.  Musculoskeletal:     Cervical back: Neck supple. No rigidity.     Right lower leg: No edema.     Left lower leg: No edema.  Skin:    General: Skin is warm.  Neurological:     Mental Status: He is alert and oriented to person, place, and time.  Psychiatric:        Mood and Affect: Mood normal.        Behavior: Behavior normal.      Diabetic foot exam was performed with the following findings:   Normal sensation of 10g monofilament Intact posterior tibialis and dorsalis pedis pulses Band-Aid over right second and 4th toe from recent toe arthroplasty. No obvious discharge, erythema.      No results found for any visits on 10/27/24.  The ASCVD Risk score (Arnett DK, et al., 2019) failed to calculate for the following reasons:   Risk score cannot be calculated because patient has a medical history suggesting prior/existing ASCVD     Assessment & Plan:   Assessment &  Plan Annual physical exam Colorectal cancer screening: Colonoscopy up-to-date, has a h/o colon polyp and is recommended to have a repeat colonoscopy.  Due  in 02/2027 (Dr. Josepha).  Recommend pneumonia 20, qualifies for hepatitis B vaccine. Patient politely declined.   Recommend annual diabetic eye exam.      Type 2 diabetes mellitus with other specified complication, without long-term current use of insulin  (HCC) Due for urine microalbumin check, ordered. Continue Metformin  500 mg daily. Already on Farxiga  10 mg daily for cardiac protection.  Orders:   Urine Microalbumin w/creat. ratio  Abdominal bloating Started after recent right foot surgery on 10/09/24. Has been gradually getting bette. Last BM on 10/27/24.  Recommend Miralax 1 capful daily for 7 days in a row. Bland diet. Protonix  which he is taking 20 mg daily at baseline to be increased to 2 tab in the morning and 2 tab in the evening for about a week. Activities as tolerated. Patient to update us  on symptoms, if acute worsening abdominal pain, nausea, vomiting recommend ED evaluation. If gradual improvement reduce dose of Pantoprazole  to 20 mg once a day. If symptoms persists recommend getting KUB. Patient will reach out to us  with symptoms update.      Abnormal liver function Mildly elevated AST on 09/29/24, repeat hepatic function, GGT today. Physical exam reassuring. If LFT continues to be elevated consider RUQ ultrasounds.   Orders:   Hepatic function panel   Gamma GT  Encounter for diabetic foot exam (HCC) B/L feet inspected., normal diabetic foot exam.          Return in about 3 months (around 01/27/2025) for Chronic follow up .   Luke Shade, MD

## 2024-10-27 NOTE — Assessment & Plan Note (Addendum)
 Due for urine microalbumin check, ordered. Continue Metformin  500 mg daily. Already on Farxiga  10 mg daily for cardiac protection.  Orders:   Urine Microalbumin w/creat. ratio

## 2024-10-27 NOTE — Assessment & Plan Note (Signed)
>>  ASSESSMENT AND PLAN FOR SLEEP DISTURBANCE WRITTEN ON 10/27/2024  1:40 PM BY Duston Smolenski, MD  Chronic, difficulty falling asleep. Taking prn Trazodone  with feeling groggy in the morning. OSA managed by pulmonology. Recently prescribed for Ambien for short course to help with insomnia. Patient has not started this yet and prefers not to start it unless absolutely necessary.

## 2024-10-28 ENCOUNTER — Other Ambulatory Visit: Payer: Self-pay

## 2024-10-28 ENCOUNTER — Ambulatory Visit: Payer: Self-pay

## 2024-10-28 LAB — HEPATIC FUNCTION PANEL
ALT: 33 U/L (ref 0–53)
AST: 24 U/L (ref 0–37)
Albumin: 4.5 g/dL (ref 3.5–5.2)
Alkaline Phosphatase: 100 U/L (ref 39–117)
Bilirubin, Direct: 0.1 mg/dL (ref 0.0–0.3)
Total Bilirubin: 0.4 mg/dL (ref 0.2–1.2)
Total Protein: 7.3 g/dL (ref 6.0–8.3)

## 2024-10-28 LAB — GAMMA GT: GGT: 49 U/L (ref 7–51)

## 2024-10-29 ENCOUNTER — Ambulatory Visit: Attending: Cardiovascular Disease

## 2024-10-29 DIAGNOSIS — I255 Ischemic cardiomyopathy: Secondary | ICD-10-CM | POA: Diagnosis not present

## 2024-10-29 DIAGNOSIS — I5042 Chronic combined systolic (congestive) and diastolic (congestive) heart failure: Secondary | ICD-10-CM

## 2024-10-29 DIAGNOSIS — Z951 Presence of aortocoronary bypass graft: Secondary | ICD-10-CM

## 2024-10-29 DIAGNOSIS — Z5181 Encounter for therapeutic drug level monitoring: Secondary | ICD-10-CM

## 2024-10-29 DIAGNOSIS — I480 Paroxysmal atrial fibrillation: Secondary | ICD-10-CM | POA: Diagnosis not present

## 2024-10-29 DIAGNOSIS — I25110 Atherosclerotic heart disease of native coronary artery with unstable angina pectoris: Secondary | ICD-10-CM

## 2024-10-29 DIAGNOSIS — I5020 Unspecified systolic (congestive) heart failure: Secondary | ICD-10-CM

## 2024-10-29 DIAGNOSIS — I493 Ventricular premature depolarization: Secondary | ICD-10-CM

## 2024-10-29 LAB — POCT INR: INR: 1.6 — AB (ref 2.0–3.0)

## 2024-10-29 NOTE — Patient Instructions (Signed)
 Take 2 tablets tonight only then Continue 2.5 mg  DAILY, EXCEPT 5 mg EVERY MONDAY, WEDNESDAY and FRIDAY.  INR in 4 weeks. [phone]

## 2024-11-02 NOTE — Progress Notes (Unsigned)
 Electrophysiology Clinic Note    Date:  11/02/2024  Patient ID:  Bobby, [name] 06-Jul-1971, MRN [id number] PCP:  Abbey Bruckner, MD  Cardiologist:  Evalene Lunger, MD HF Cardiologist: Bensimhon  Electrophysiologist:  OLE ONEIDA HOLTS, MD  Electrophysiology APP:  Chrysa Rampy, NP    ***refresh  Discussed the use of AI scribe software for clinical note transcription with the patient, who gave verbal consent to proceed.   Patient Profile    Chief Complaint: 3 mon ICD implant  History of Present Illness: Bobby [name] is a 53 y.o. male with PMH notable for  ICM, CAD s/p PCI w subsequent CABG (2024), ICM c/b LV thrombus, HFrEF, PVC, parox AFib, HTN, OSA on CPAP ; seen today for OLE ONEIDA HOLTS, MD for routine electrophysiology follow-up s/p Defibrillator implant.  He is on coumadin  for LV thrombus and stroke ppx for AFib.  He had dual chamber ICD implanted 07/2024 for persistently reduced LVEF, significant scar burden on cMRI.   On follow-up today, ***   - on amiodarone ??   Since last being seen in our clinic the patient reports doing ***.  he denies chest pain, palpitations, dyspnea, PND, orthopnea, nausea, vomiting, dizziness, syncope, edema, weight gain, or early satiety.       Arrhythmia/Device History Bos Sci dual chamber ICD, imp 07/2024; dx ICM   AAD -  Mexiletine     ROS:  Please see the history of present illness. All other systems are reviewed and otherwise negative.    Physical Exam    VS:  There were no vitals taken for this visit. BMI: There is no height or weight on file to calculate BMI.           Wt Readings from Last 3 Encounters:  10/27/24 213 lb (96.6 kg)  10/13/24 212 lb 3.2 oz (96.3 kg)  10/07/24 217 lb 2 oz (98.5 kg)     GEN- The patient is well appearing, alert and oriented x 3 today.   Lungs- Clear to ausculation bilaterally, normal work of breathing.  Heart- {Blank single:19197::Regular,Irregularly irregular} rate and  rhythm, no murmurs, rubs or gallops Extremities- {EDEMA LEVEL:28147::No} peripheral edema, warm, dry Skin-  *** device pocket well-healed, no tethering   Device interrogation done today and reviewed by myself:  Battery *** Lead thresholds, impedence, sensing stable *** *** episodes *** changes made today   Studies Reviewed   Previous EP, cardiology notes.    EKG is not ordered. Personal review of EKG from 10/07/2024 shows:  SB at 59,  LAD        Cardiac MRI, 03/05/2024 1. Moderate-severely reduced systolic function (LVEF = 31%).  2. Subendocardial LGE in the LV mid to apical anterior and anteroseptal walls, apical walls.   3. Small apical thrombus noted, measures about 10 x 4 mm.  4. Moderately reduced RV function.  5. Findings suggest ischemic cardiomyopathy with prior LAD infarct.   TTE, 12/20/2023  1. Global hypokinesis with Severe apical hypokinesis/akinesis, images with definity  suggestive of chronic/calcified and likely new mural thrombus. Calcification/chronic thrombus in apical region noted on prior TEE 7/24.   2. Left ventricular ejection fraction, by estimation, is 25 to 30%. The left ventricle has severely decreased function. The left ventricle demonstrates global hypokinesis. The left ventricular internal cavity size was moderately dilated. Left ventricular diastolic parameters are indeterminate.   3. Right ventricular systolic function is mildly reduced. The right ventricular size is normal. Tricuspid regurgitation signal is inadequate for assessing PA pressure.  4. Left atrial size was moderately dilated.   5. The mitral valve is normal in structure. No evidence of mitral valve regurgitation. No evidence of mitral stenosis.   6. The aortic valve is tricuspid. Aortic valve regurgitation is not visualized. No aortic stenosis is present.   7. The inferior vena cava is normal in size with greater than 50% respiratory variability, suggesting right atrial pressure of 3  mmHg.    Long term monitor, 11/19/2023 Patient had a min HR of 51 bpm, max HR of 135 bpm, and avg HR of 76 bpm.  1 run of Ventricular Tachycardia occurred lasting 6 beats with a max rate of 135 bpm (avg 120 bpm).  1 run of Supraventricular Tachycardia occurred lasting 8 beats with a max rate of 126 bpm (avg 115 bpm).  Isolated SVEs were rare (<1.0%), and no SVE Couplets or SVE Triplets were present.  Isolated VEs were frequent (17.6%, 130307), VE Couplets were rare (<1.0%, 1119), and VE Triplets were rare (<1.0%, 2).  Ventricular Bigeminy and Trigeminy were present.    Patient triggered events (29) associated with normal sinus rhythm and PVCs   TTE, 06/2020 Poor visualization of endocardial borders.  Left ventricle cavity is  normal in size and thickness. Anterior/anteroseptal akinesis. LVEF  probably 40-45%.  Indeterminate diastolic filling pattern.  No significant valvular abnormalities.  normal right atrial pressure.  No significant change compared to previous study in 2018.      TTE, 06/22/2023  1. Left ventricular ejection fraction, by estimation, is 25 to 30%. The left ventricle has severely decreased function. The left ventricle  demonstrates regional wall motion abnormalities (see scoring  diagram/findings for description). There is mild left ventricular hypertrophy. Left ventricular diastolic parameters are consistent with Grade I diastolic dysfunction (impaired relaxation). There is akinesis of the left ventricular, entire anterior wall and apical segment.   2. Right ventricular systolic function is mildly reduced. The right  ventricular size is mildly enlarged. Tricuspid regurgitation signal is  inadequate for assessing PA pressure.   3. The mitral valve was not well visualized. No evidence of mitral valve regurgitation. No evidence of mitral stenosis.   4. The aortic valve was not well visualized. Aortic valve regurgitation is not visualized. No aortic stenosis is present.       Ambulatory monitor, 03/2020 HR 44-150 bpm. Avg HR 72 bpm. Supraventricular ectopy, PAC/up to 6 beat run. SVE burden <1% Ventricular ectopy, PVC, NSVT upto 5 beats. VE burden <1% No atrial fibrillation/atrial flutter/SVT/high grade AV block, sinus pause >3sec noted. 12 patient activated events do not correlate with arrhythmias  Assessment and Plan     #) ICM s/p ICD    #) parox AFib *** on amiodarone    #) Hypercoag d/t parox afib CHA2DS2-VASc Score = at least 4 [CHF History: 1, HTN History: 1, Diabetes History: 1, Stroke History: 0, Vascular Disease History: 1, Age Score: 0, Gender Score: 0].  Therefore, the patient's annual risk of stroke is 4.8 %.     {Confirm score is correct.  If not, click here to update score.  REFRESH note.  :1}   Stroke ppx - warfarin, managed at [facility] office No bleeding concerns    #) PVC Amio as above Contiue 150mg  mexiletine, 25mg  toprol     {Are you ordering a CV Procedure (e.g. stress test, cath, DCCV, TEE, etc)?   Press F2        :[id number]}   Current medicines are reviewed at length with the patient today.  The patient {ACTIONS; HAS/DOES NOT HAVE:19233} concerns regarding his medicines.  The following changes were made today:  {NONE DEFAULTED:18576}  Labs/ tests ordered today include: *** No orders of the defined types were placed in this encounter.    Disposition: Follow up with {EPMDS:28135::EP Team} or EP APP {EPFOLLOW UP:28173}   Signed, Chantal Needle, NP  11/02/24  8:05 PM  Electrophysiology [facility]

## 2024-11-03 ENCOUNTER — Encounter: Payer: Self-pay | Admitting: Cardiology

## 2024-11-03 ENCOUNTER — Ambulatory Visit: Attending: Cardiology | Admitting: Cardiology

## 2024-11-03 ENCOUNTER — Ambulatory Visit: Admitting: Cardiovascular Disease

## 2024-11-03 VITALS — BP 114/60 | HR 63 | Resp 17 | Ht 73.0 in | Wt 218.0 lb

## 2024-11-03 DIAGNOSIS — I480 Paroxysmal atrial fibrillation: Secondary | ICD-10-CM

## 2024-11-03 DIAGNOSIS — I493 Ventricular premature depolarization: Secondary | ICD-10-CM

## 2024-11-03 DIAGNOSIS — Z95810 Presence of automatic (implantable) cardiac defibrillator: Secondary | ICD-10-CM

## 2024-11-03 DIAGNOSIS — I255 Ischemic cardiomyopathy: Secondary | ICD-10-CM | POA: Diagnosis not present

## 2024-11-03 LAB — CUP PACEART INCLINIC DEVICE CHECK
Date Time Interrogation Session: 20251110151648
HighPow Impedance: 76 Ohm
Implantable Lead Connection Status: 753985
Implantable Lead Connection Status: 753985
Implantable Lead Implant Date: 20250806
Implantable Lead Implant Date: 20250806
Implantable Lead Location: 753859
Implantable Lead Location: 753860
Implantable Lead Model: 673
Implantable Lead Model: 7841
Implantable Lead Serial Number: 1632766
Implantable Lead Serial Number: 270735
Implantable Pulse Generator Implant Date: 20250806
Lead Channel Impedance Value: 470 Ohm
Lead Channel Impedance Value: 508 Ohm
Lead Channel Pacing Threshold Amplitude: 0.5 V
Lead Channel Pacing Threshold Amplitude: 0.9 V
Lead Channel Pacing Threshold Pulse Width: 0.4 ms
Lead Channel Pacing Threshold Pulse Width: 0.4 ms
Lead Channel Sensing Intrinsic Amplitude: 5 mV
Lead Channel Sensing Intrinsic Amplitude: 6.7 mV
Lead Channel Setting Pacing Amplitude: 3.5 V
Lead Channel Setting Pacing Amplitude: 3.5 V
Lead Channel Setting Pacing Pulse Width: 0.4 ms
Lead Channel Setting Sensing Sensitivity: 0.5 mV
Pulse Gen Serial Number: 702869
Zone Setting Status: 755011

## 2024-11-03 NOTE — Patient Instructions (Signed)
 Medication Instructions:   Your physician recommends that you continue on your current medications as directed. Please refer to the Current Medication list given to you today.    *If you need a refill on your cardiac medications before your next appointment, please call your pharmacy*  Lab Work:  None ordered at this time   If you have labs (blood work) drawn today and your tests are completely normal, you will receive your results only by:  MyChart Message (if you have MyChart) OR  A paper copy in the mail If you have any lab test that is abnormal or we need to change your treatment, we will call you to review the results.  Testing/Procedures:  None ordered at this time   Referrals:  None ordered at this time   Follow-Up:  At [facility], you and your health needs are our priority.  As part of our continuing mission to provide you with exceptional heart care, our providers are all part of one team.  This team includes your primary Cardiologist (physician) and Advanced Practice Providers or APPs (Physician Assistants and Nurse Practitioners) who all work together to provide you with the care you need, when you need it.  Your next appointment:   1 year(s)  Provider:    You may see Bobby ONEIDA HOLTS, MD or one of the following Advanced Practice Providers on your designated Care Team:   Charlies Arthur, [place] Ozell Jodie Passey, PA-C Suzann Riddle, NP Daphne Barrack, NP Artist Pouch, PA-C     We recommend signing up for the patient portal called MyChart.  Sign up information is provided on this After Visit Summary.  MyChart is used to connect with patients for Virtual Visits (Telemedicine).  Patients are able to view lab/test results, encounter notes, upcoming appointments, etc.  Non-urgent messages can be sent to your provider as well.   To learn more about what you can do with MyChart, go to [email or web address].

## 2024-11-04 ENCOUNTER — Other Ambulatory Visit: Payer: Self-pay

## 2024-11-05 ENCOUNTER — Encounter: Admitting: Podiatry

## 2024-11-05 ENCOUNTER — Ambulatory Visit: Payer: Self-pay | Admitting: Cardiology

## 2024-11-05 ENCOUNTER — Encounter: Payer: Self-pay | Admitting: Podiatry

## 2024-11-07 ENCOUNTER — Telehealth: Payer: Self-pay | Admitting: Internal Medicine

## 2024-11-07 NOTE — Telephone Encounter (Signed)
 Called to confirm/remind patient of their appointment at the [facility] on 11/10/24.   Appointment:   [] Confirmed  [x] Left mess   [] No answer/No voice mail  [] VM Full/unable to leave message  [] Phone not in service  Patient reminded to bring all medications and/or complete list.  Confirmed patient has transportation. Gave directions, instructed to utilize valet parking.

## 2024-11-10 ENCOUNTER — Other Ambulatory Visit: Payer: Self-pay

## 2024-11-10 ENCOUNTER — Ambulatory Visit: Admitting: Internal Medicine

## 2024-11-10 ENCOUNTER — Other Ambulatory Visit
Admission: RE | Admit: 2024-11-10 | Discharge: 2024-11-10 | Disposition: A | Discharge status: Home / Self Care | Source: Ambulatory Visit | Attending: Internal Medicine | Admitting: Internal Medicine

## 2024-11-10 VITALS — BP 110/67 | HR 61 | Wt 220.4 lb

## 2024-11-10 DIAGNOSIS — I255 Ischemic cardiomyopathy: Secondary | ICD-10-CM | POA: Diagnosis not present

## 2024-11-10 DIAGNOSIS — I493 Ventricular premature depolarization: Secondary | ICD-10-CM

## 2024-11-10 DIAGNOSIS — I25110 Atherosclerotic heart disease of native coronary artery with unstable angina pectoris: Secondary | ICD-10-CM

## 2024-11-10 DIAGNOSIS — I5022 Chronic systolic (congestive) heart failure: Secondary | ICD-10-CM

## 2024-11-10 DIAGNOSIS — I480 Paroxysmal atrial fibrillation: Secondary | ICD-10-CM

## 2024-11-10 DIAGNOSIS — I5042 Chronic combined systolic (congestive) and diastolic (congestive) heart failure: Secondary | ICD-10-CM | POA: Diagnosis not present

## 2024-11-10 DIAGNOSIS — Z95810 Presence of automatic (implantable) cardiac defibrillator: Secondary | ICD-10-CM | POA: Diagnosis not present

## 2024-11-10 LAB — BASIC METABOLIC PANEL WITH GFR
Anion gap: 11 (ref 5–15)
BUN: 22 mg/dL — ABNORMAL HIGH (ref 6–20)
CO2: 28 mmol/L (ref 22–32)
Calcium: 9 mg/dL (ref 8.9–10.3)
Chloride: 100 mmol/L (ref 98–111)
Creatinine, Ser: 1.69 mg/dL — ABNORMAL HIGH (ref 0.61–1.24)
GFR, Estimated: 48 mL/min — ABNORMAL LOW (ref >60)
Glucose, Bld: 111 mg/dL — ABNORMAL HIGH (ref 70–99)
Potassium: 4.1 mmol/L (ref 3.5–5.1)
Sodium: 139 mmol/L (ref 135–145)

## 2024-11-10 LAB — PRO BRAIN NATRIURETIC PEPTIDE: Pro Brain Natriuretic Peptide: 641 pg/mL — ABNORMAL HIGH (ref <300.0)

## 2024-11-10 MED ORDER — LOSARTAN POTASSIUM 25 MG PO TABS
12.5000 mg | ORAL_TABLET | Freq: Every day | ORAL | 6 refills | Status: AC
  Filled 2024-11-10: qty 45, 90d supply, fill #0

## 2024-11-10 NOTE — Patient Instructions (Addendum)
 Medication Changes:  START Losartan  12.5 mg (1/2 tab) Daily at bedtime  Lab Work:  Labs are needed today, Please go downstairs to [facility] on LOWER LEVEL to have your blood work completed.  We will only call you if the results are abnormal or if the provider would like to make medication changes.  No news is good news, Labs done today, your results will be available in MyChart, we will contact you for abnormal readings.   Testing/Procedures:  Your physician has requested that you have an echocardiogram. Echocardiography is a painless test that uses sound waves to create images of your heart. It provides your doctor with information about the size and shape of your heart and how well your heart's chambers and valves are working. This procedure takes approximately one hour. There are no restrictions for this procedure. Please do NOT wear cologne, perfume, aftershave, or lotions (deodorant is allowed). Please arrive 15 minutes prior to your appointment time. IN 4 MONTHS  Please note: We ask at that you not bring children with you during ultrasound (echo/ vascular) testing. Due to room size and safety concerns, children are not allowed in the ultrasound rooms during exams. Our front office staff cannot provide observation of children in our lobby area while testing is being conducted. An adult accompanying a patient to their appointment will only be allowed in the ultrasound room at the discretion of the ultrasound technician under special circumstances. We apologize for any inconvenience.  You will check in for this in the [facility] at [facility] on the LOWER LEVEL. You have to arrive 15 MINS EARLY for preparation, otherwise you will have to reschedule. They will call you to get this scheduled. However, if you'd like to reach out to them instead, their number is [phone].   Special Instructions // Education:  Do the following things EVERYDAY: Weigh yourself in the morning  before breakfast. Write it down and keep it in a log. Take your medicines as prescribed Eat low salt foods--Limit salt (sodium) to 2000 mg per day.  Stay as active as you can everyday Limit all fluids for the day to less than 2 liters   Follow-Up in: 4 months with echocardiogram, YOU WILL BE CALLED CLOSER TO THIS TIME TO SCHEDULE    If you have any questions or concerns before your next appointment please send us  a message through [place] or call our office at [phone], If it is after office hours your call will be answered by our answering service and directed appropriately.     At the [facility], you and your health needs are our priority. We have a designated team specialized in the treatment of Heart Failure. This Care Team includes your primary Heart Failure Specialized Cardiologist (physician), Advanced Practice Providers (APPs- Physician Assistants and Nurse Practitioners), and Pharmacist who all work together to provide you with the care you need, when you need it.   You may see any of the following providers on your designated Care Team at your next follow up:  Dr. Toribio Fuel Dr. Ezra Shuck Dr. Ria Commander Dr. Odis Brownie Greig Mosses, NP Caffie Shed, [place] [address] [place], [place] Beckey Coe, NP Jordan Lee, NP Ellouise Class, NP Jaun Bash, PharmD

## 2024-11-10 NOTE — Progress Notes (Signed)
 [facility] Consult  Note    Referring Physician: Fernande Standing, MD PCP: Bair, Kalpana, MD - no loner in practice. Awaiting new PCP Cardiologist: Timothy Gollan, MD / Loistine Sober, NP (last seen 12/24)  Chief Complaint: f/u for chronic systolic heart failure   HPI:  Bobby [name] is a 53 y/o male referred by Dr Fernande for further evaluation of his HF.   He has a history of CAD w/ stent 07/ 2018, CABG 06/24, frequent PVC's, LV apical clot, depression, hypothyroidism, HTN, OSA, PAF and chronic heart failure. He has his first MI at the age of 33.  Patient with extensive cardiac history. He was previously followed by Dr. Elmira. He underwent stenting x 2 to RCA in 2003. In July 2018 he presented with an anterior STEMI and was taken emergently to the the Cath Lab. LHC revealed in-stent CTO of RCA, ostial LAD 100%, diagonal 80%, OM 70% with left-to-right collaterals. He underwent PCI with DES to ostial LAD. Impella was placed secondary to cardiogenic shock. Echo showed EF 30 to 35%. Relook LHC August 2018 showed patent stent. cMRI 11/18 had shown LGE consistent with full-thickness clot in the context of his coronary artery disease.   Echo July 2021 40-45%. Stress test August 2023 showed abnormal perfusion with fixed defect, EF 41%, intermediate risk.   LHC August 2021 secondary to abnormal stress test was essentially unchanged from previous.   Seen in ED on 06/15/2023 with chest pain and found to have new onset A-fib. He was started on Eliquis  and spontaneously converted in the ER. Troponin 97. Echo showed EF 25 to 30%. LHC showed significant two-vessel CAD  LAD stent patent, 90 % ostial LCx (jailed by LAD stent), RCA 100% mid. Transferred to [facility] for CTS evaluation. He underwent CABG x 1 on 06/25/2023 (LIMA to OM-1)   cMRI 03/05/24:  1. Moderate-severely reduced systolic function (LVEF = 31%).  2. Subendocardial LGE in the LV mid to apical anterior and anteroseptal  walls, apical walls.  3. Small apical thrombus noted, measures about 10 x 4 mm.  4. Moderately reduced RV function.  5. Findings suggest ischemic cardiomyopathy with prior LAD infarct.  Saw Dr. Cindie 04/30/24 plan for ICD. Repeat echo ordered   Echo 5/25 EF 35-40% RV mildly reduced. Mild Bobby -> Dr. Cindie suggested repeat echo 6-8 weeks  Echo 7/25 EF 25-30% RV low normal  CPX test 06/26/24 FEV1 2.95 (71% FVC 3.70 (69%) PVO2 17.4 (57%) - corrected to ibw 20.1 Slope 40  pRER 1.33 VE/MVV 78%   ICD placed 07/30/24 - BosSCi  Here for f/u. Doing pretty well. Recently had toe surgery so recovering from this. Still with some fatigue but feeling better. Can go to the store without problem. Mild dizziness. No CP, edema, orthopnea or PND.   ROS: All systems negative except what is listed in HPI, PMH and Problem List   Past Medical History:  Diagnosis Date   Abnormal liver function    AICD (automatic cardioverter/defibrillator) present 07/30/2024   a.) [employer] EL ICD DR (SN: [id number])   Anemia    Anticoagulated on warfarin    Anxiety    Aortic atherosclerosis    B12 deficiency    Cardiogenic shock (HCC) 07/13/2017   a.) required Impella ventricular assist device implantation   CKD stage 3a, GFR 45-59 ml/min (HCC)    Coronary artery disease    Depression    DM (diabetes mellitus), type 2 (HCC)    Dyspnea  Erectile dysfunction    a.) on PDE5i (sildenafil )   Frequent PVCs    GERD (gastroesophageal reflux disease)    Graves disease    a.) s/p I-131 ablation in 2012   Hammertoe of right foot    HFrEF (heart failure with reduced ejection fraction) (HCC)    History of cardiac murmur in childhood    History of smoking    Hyperlipidemia    Hypertension    Hypogonadism male    Hypothyroidism    Incomplete left bundle branch block (ILBBB)    Insomnia    a.) uses trazodone    Ischemic cardiomyopathy    Long term current use of amiodarone     Long-term use of  aspirin  therapy    Mucoid cyst of joint    Myalgia 05/21/2019   OSA on CPAP    PAF (paroxysmal atrial fibrillation) (HCC)    a.) CHA2DS2VASc = 4 (HFrEF, HTN, prior MI/vascular disease, T2DM) as of 10/08/2024; b.) rate/rhythm maintained on oral digoxin  + amiodarone  + metoprolol  succinate; chronically anticoagulated with warfarin   Preoperative clearance 09/29/2024   S/P CABG x 1 06/25/2023   a.) LIMA-OM1   Seasonal allergies    ST elevation myocardial infarction (STEMI) of anterior wall (HCC) 07/13/2017   a.) LHC 07/13/2017: occluded oLAD (3.5x26 Onyx DES), 80%RI, occluded RCA at prior stent site with collaterals   ST elevation myocardial infarction (STEMI) of inferior wall (HCC) 2003   a.) LHC revealed occluded RCA --> stented with overlapping 3.0 x 33 mm Cypher DES x 2   Tachycardia 03/15/2012   Vitamin D  deficiency 05/27/2023    Current Outpatient Medications  Medication Sig Dispense Refill   acetaminophen  (TYLENOL ) 325 MG tablet Take 1-2 tablets (325-650 mg total) by mouth every 4 (four) hours as needed for mild pain (pain score 1-3).     amiodarone  (PACERONE ) 200 MG tablet Take 200 mg by mouth at bedtime.     aspirin  81 MG chewable tablet Chew 1 tablet (81 mg total) by mouth daily. 30 tablet 11   atorvastatin  (LIPITOR) 20 MG tablet Take 1 tablet (20 mg total) by mouth daily. 90 tablet 3   dapagliflozin  propanediol (FARXIGA ) 10 MG TABS tablet Take 1 tablet (10 mg total) by mouth daily before breakfast. 90 tablet 3   digoxin  (LANOXIN ) 0.125 MG tablet Take 1 tablet (0.125 mg total) by mouth daily. 30 tablet 5   Evolocumab  (REPATHA  SURECLICK) 140 MG/ML SOAJ Inject 140mg  into the skin once every 14 days. 6 mL 3   ezetimibe  (ZETIA ) 10 MG tablet Take 1 tablet (10 mg total) by mouth daily. 90 tablet 3   levothyroxine  (SYNTHROID ) 175 MCG tablet Take 1 tablet (175 mcg total) by mouth daily. 60 tablet 1   metFORMIN  (GLUCOPHAGE -XR) 500 MG 24 hr tablet Take 1 tablet (500 mg total) by mouth daily  with breakfast. 90 tablet 1   metoprolol  succinate (TOPROL -XL) 25 MG 24 hr tablet Take 1 tablet (25 mg total) by mouth at bedtime. 90 tablet 3   mexiletine (MEXITIL ) 150 MG capsule Take 1 capsule (150 mg total) by mouth 2 (two) times daily. 180 capsule 1   Multiple Vitamins-Minerals (MULTIVITAMIN WITH MINERALS) tablet Take 1 tablet by mouth daily.     pantoprazole  (PROTONIX ) 20 MG tablet Take 1 tablet (20 mg total) by mouth 2 (two) times daily. 180 tablet 2   sertraline  (ZOLOFT ) 100 MG tablet Take 1 tablet (100 mg total) by mouth daily. 90 tablet 3   sildenafil  (VIAGRA ) 100 MG tablet  Take 1 tablet (100 mg total) by mouth as needed for erectile dysfunction. TAKE 1 (ONE) TABLET AS NEEDED. DO NOT USE AT SAME TIME AS NITRO 30 tablet 3   spironolactone  (ALDACTONE ) 25 MG tablet Take 0.5 tablets (12.5 mg total) by mouth daily. 45 tablet 3   torsemide  (DEMADEX ) 20 MG tablet Take 1 tablet (20 mg total) by mouth daily. May also take 1 additional tablet (20 mg total) as needed for weight gain or swelling. 35 tablet 5   traZODone  (DESYREL ) 100 MG tablet Take 1 tablet (100 mg total) by mouth at bedtime as needed for sleep. 20 tablet 0   warfarin (COUMADIN ) 5 MG tablet Take 0.5-1 tablets (2.5-5 mg total) by mouth daily. Take 0.5 (2.5 mg) tablet daily until follow up 8/13     zolpidem (AMBIEN) 5 MG tablet Take 1 tablet (5 mg total) by mouth at bedtime as needed for sleep. 30 tablet 0   No current facility-administered medications for this visit.    No Known Allergies    Social History   Socioeconomic History   Marital status: Married    Spouse name: Not on file   Number of children: 2   Years of education: Not on file   Highest education level: Associate degree: occupational, scientist, product/process development, or vocational program  Occupational History   Not on file  Tobacco Use   Smoking status: Former    Current packs/day: 0.00    Average packs/day: 0.5 packs/day for 20.0 years (10.0 ttl pk-yrs)    Types: Cigarettes     Start date: 07/13/1997    Quit date: 07/13/2017    Years since quitting: 7.3   Smokeless tobacco: Never  Vaping Use   Vaping status: Never Used  Substance and Sexual Activity   Alcohol use: Not Currently    Comment: occ   Drug use: Not Currently   Sexual activity: Yes  Other Topics Concern   Not on file  Social History Narrative   Lives with fiance, working FT , disesel equip, right handed, 2 kids, college edu   Social Drivers of Health   Financial Resource Strain: Patient Declined (10/26/2024)   Overall Financial Resource Strain (CARDIA)    Difficulty of Paying Living Expenses: Patient declined  Food Insecurity: Patient Declined (10/26/2024)   Hunger Vital Sign    Worried About Running Out of Food in the Last Year: Patient declined    Ran Out of Food in the Last Year: Patient declined  Transportation Needs: Patient Declined (10/26/2024)   PRAPARE - Administrator, Civil Service (Medical): Patient declined    Lack of Transportation (Non-Medical): Patient declined  Physical Activity: Sufficiently Active (10/26/2024)   Exercise Vital Sign    Days of Exercise per Week: 5 days    Minutes of Exercise per Session: 60 min  Stress: Stress Concern Present (10/26/2024)   [employer] of Occupational Health - Occupational Stress Questionnaire    Feeling of Stress: Rather much  Social Connections: Unknown (10/26/2024)   Social Connection and Isolation Panel    Frequency of Communication with Friends and Family: Three times a week    Frequency of Social Gatherings with Friends and Family: Patient declined    Attends Religious Services: Patient declined    Active Member of Clubs or Organizations: No    Attends Banker Meetings: Not on file    Marital Status: Patient declined  Intimate Partner Violence: Not At Risk (07/30/2024)   Humiliation, Afraid, Rape, and Kick questionnaire  Fear of Current or Ex-Partner: No    Emotionally Abused: No    Physically Abused:  No    Sexually Abused: No      Family History  Problem Relation Age of Onset   Valvular heart disease Mother    Depression Mother    Heart disease Father 79       CABG   Hypertension Father    Heart attack Father    Heart disease Paternal Grandmother    Vitals:   11/10/24 1444  BP: 110/67  Pulse: 61  SpO2: 96%  Weight: 220 lb 6 oz (100 kg)      Wt Readings from Last 3 Encounters:  11/10/24 220 lb 6 oz (100 kg)  11/03/24 218 lb (98.9 kg)  10/27/24 213 lb (96.6 kg)   Lab Results  Component Value Date   CREATININE 1.50 09/29/2024   CREATININE 1.28 (H) 08/22/2024   CREATININE 1.64 (H) 07/23/2024   PHYSICAL EXAM: General:  Sitting up. No resp difficulty HEENT: normal Neck: supple. no JVD.  Cor: Regular rate & rhythm. No rubs, gallops or murmurs. Lungs: clear Abdomen: soft, nontender, nondistended.Good bowel sounds. Extremities: no cyanosis, clubbing, rash, edema Neuro: alert & orientedx3, cranial nerves grossly intact. moves all 4 extremities w/o difficulty. Affect pleasant   ReDS n/a  ASSESSMENT & PLAN:  1. Chronic systolic HF due to iCM - EF chronically low since 2018 (30-35%), multiple PCIs, eventual CABG   - RHC 6/24 (pre CABG) RA 7, PA 28/15, PCW 16, PA sat 71%, FICK CO/CI 5.1/2.3  - Echo 12/20/23: EF 25-30%, chronic thrombus in apex, RV mildly reduced, moderate LAE - cMRI 03/05/24: LVEF 31%, RVEF 36%, Subendocardial LGE in the LV mid to apical anterior and anteroseptal walls, apical walls c/w ICM, + small apical thrombus  - Echo 5/25 EF 35-40% RV ok. Mild Bobby No LV clot - Echo 7/25 EF 25-30% RV low normal - CPX 7/25 FEV1 2.95 (71%) FVC 3.70 (69%) PVO2 17.4 (57%) - corrected to ibw 20.1 Slope 40  pRER 1.33 VE/MVV 78%  - s/p BosSCi ICD 7/25 - Improving NYHA II  - Volume okContinue torsemide  20 daily - continue farxiga  10mg  daily - continue metoprolol  succinate 25mg  daily - Continue digoxin  0.125 - continue spiro 12.5 daily - Off losartan  due to low BP.  Will try losartan  12.5  - ICD interrogated personally: HL score ). Volume Ok no VT/AF Personally reviewed - Labs today. F/u 4 months with echo    2. Atrial fibrillation/ PVC's- - Followed by EP - controlled on amiodarone  100mg  daily and mexiletine 150mg  BID - zio 2/25: Sinus Rhythm avg HR 67 Occasional PACs. Rare PVCs - Zio 4/25 Sinus avg 68 Frequent PVCs 6.3% - remains in NSR - Continue Eliquis    3. CAD- - 1st MI at age 10 treated w/ PCI, CABG x 1 06/24 (LIMA-OM1)  - continue ASA 81mg  daily - continue atorvastatin  20mg  daily - continue repatha  140mg  every 2 weeks - continue ezetimibe  10mg  daily - no s/s angina - manage dby Gen Cards  4. Hypothyroidism- - likely due to amio - continue synthroid   - TSH ok (3.7 on 04/29/24) - followed by Endo/PCP  5. OSA- - continue CPAP  6. Depression/insomnia - on Sertraline    Toribio Fuel, MD 11/10/24

## 2024-11-11 ENCOUNTER — Other Ambulatory Visit: Payer: Self-pay

## 2024-11-11 ENCOUNTER — Encounter: Payer: Self-pay | Admitting: Podiatry

## 2024-11-11 ENCOUNTER — Ambulatory Visit: Admitting: Podiatry

## 2024-11-11 VITALS — Ht 73.0 in | Wt 220.4 lb

## 2024-11-11 DIAGNOSIS — M6740 Ganglion, unspecified site: Secondary | ICD-10-CM

## 2024-11-11 MED ORDER — DOXYCYCLINE HYCLATE 100 MG PO TABS
100.0000 mg | ORAL_TABLET | Freq: Two times a day (BID) | ORAL | 0 refills | Status: DC
  Filled 2024-11-11: qty 20, 10d supply, fill #0

## 2024-11-11 NOTE — Progress Notes (Signed)
 Chief Complaint  Patient presents with   Routine Post Op    Pt is here to f/u on right second toe, states there is some stitching that is still visible.    Subjective:  Patient presents today status post DIPJ arthroplasty of the right 2nd and 4th digit.  DOS: 10/09/2024.  Patient noticed over the last week and his observable stitch protruding out of the distal aspect of the right second toe at the area of the surgical incision site.  It is very tender and painful.  He has also noticed increased erythema around the toe  Past Medical History:  Diagnosis Date   Abnormal liver function    AICD (automatic cardioverter/defibrillator) present 07/30/2024   a.) [employer] EL ICD DR (SN: [id number])   Anemia    Anticoagulated on warfarin    Anxiety    Aortic atherosclerosis    B12 deficiency    Cardiogenic shock (HCC) 07/13/2017   a.) required Impella ventricular assist device implantation   CKD stage 3a, GFR 45-59 ml/min (HCC)    Coronary artery disease    Depression    DM (diabetes mellitus), type 2 (HCC)    Dyspnea    Erectile dysfunction    a.) on PDE5i (sildenafil )   Frequent PVCs    GERD (gastroesophageal reflux disease)    Graves disease    a.) s/p I-131 ablation in 2012   Hammertoe of right foot    HFrEF (heart failure with reduced ejection fraction) (HCC)    History of cardiac murmur in childhood    History of smoking    Hyperlipidemia    Hypertension    Hypogonadism male    Hypothyroidism    Incomplete left bundle branch block (ILBBB)    Insomnia    a.) uses trazodone    Ischemic cardiomyopathy    Long term current use of amiodarone     Long-term use of aspirin  therapy    Mucoid cyst of joint    Myalgia 05/21/2019   OSA on CPAP    PAF (paroxysmal atrial fibrillation) (HCC)    a.) CHA2DS2VASc = 4 (HFrEF, HTN, prior MI/vascular disease, T2DM) as of 10/08/2024; b.) rate/rhythm maintained on oral digoxin  + amiodarone  + metoprolol  succinate; chronically  anticoagulated with warfarin   Preoperative clearance 09/29/2024   S/P CABG x 1 06/25/2023   a.) LIMA-OM1   Seasonal allergies    ST elevation myocardial infarction (STEMI) of anterior wall (HCC) 07/13/2017   a.) LHC 07/13/2017: occluded oLAD (3.5x26 Onyx DES), 80%RI, occluded RCA at prior stent site with collaterals   ST elevation myocardial infarction (STEMI) of inferior wall (HCC) 2003   a.) LHC revealed occluded RCA --> stented with overlapping 3.0 x 33 mm Cypher DES x 2   Tachycardia 03/15/2012   Vitamin D  deficiency 05/27/2023    Past Surgical History:  Procedure Laterality Date   ABDOMINAL AORTOGRAM N/A 06/22/2023   Procedure: ABDOMINAL AORTOGRAM;  Surgeon: Mady Bruckner, MD;  Location: [facility] INVASIVE CV LAB;  Service: Cardiovascular;  Laterality: N/A;   CORONARY ANGIOPLASTY WITH STENT PLACEMENT Left 2003   Procedure: CORONARY ANGIOPLASTY WITH STENT PLACEMENT   CORONARY ARTERY BYPASS GRAFT N/A 06/25/2023   Procedure: CORONARY ARTERY BYPASS GRAFTING (CABG) x1 USING LEFT INTERNAL MAMMARY ARTERY (LIMA);  Surgeon: Kerrin Elspeth BROCKS, MD;  Location: [facility] OR;  Service: Open Heart Surgery;  Laterality: N/A;   CORONARY/GRAFT ACUTE MI REVASCULARIZATION N/A 07/13/2017   Procedure: Coronary/Graft Acute MI Revascularization;  Surgeon: Ladona Heinz, MD;  Location: [facility] INVASIVE  CV LAB;  Service: Cardiovascular;  Laterality: N/A;   ESOPHAGOGASTRODUODENOSCOPY N/A 06/30/2020   Procedure: ESOPHAGOGASTRODUODENOSCOPY (EGD);  Surgeon: Toledo, Ladell POUR, MD;  Location: [facility] ENDOSCOPY;  Service: Gastroenterology;  Laterality: N/A;   HAMMER TOE SURGERY Right 10/09/2024   Procedure: CORRECTION, HAMMER TOE;  Surgeon: Janit Thresa HERO, DPM;  Location: [facility] ORS;  Service: Orthopedics/Podiatry;  Laterality: Right;   ICD IMPLANT N/A 07/30/2024   Procedure: ICD IMPLANT;  Surgeon: Cindie Ole DASEN, MD;  Location: [facility] INVASIVE CV LAB;  Service: Cardiovascular;  Laterality: N/A;   LEFT HEART CATH AND CORONARY  ANGIOGRAPHY N/A 07/13/2017   Procedure: Left Heart Cath and Coronary Angiography;  Surgeon: Ladona Heinz, MD;  Location: [facility]VASIVE CV LAB;  Service: Cardiovascular;  Laterality: N/A;   LEFT HEART CATH AND CORONARY ANGIOGRAPHY N/A 08/21/2017   Procedure: LEFT HEART CATH AND CORONARY ANGIOGRAPHY;  Surgeon: Elmira Newman PARAS, MD;  Location: [facility] INVASIVE CV LAB;  Service: Cardiovascular;  Laterality: N/A;   LEFT HEART CATH AND CORONARY ANGIOGRAPHY N/A 08/10/2020   Procedure: LEFT HEART CATH AND CORONARY ANGIOGRAPHY;  Surgeon: Elmira Newman PARAS, MD;  Location: [facility] INVASIVE CV LAB;  Service: Cardiovascular;  Laterality: N/A;   RIGHT/LEFT HEART CATH AND CORONARY ANGIOGRAPHY N/A 06/22/2023   Procedure: RIGHT/LEFT HEART CATH AND CORONARY ANGIOGRAPHY;  Surgeon: Mady Bruckner, MD;  Location: [facility] INVASIVE CV LAB;  Service: Cardiovascular;  Laterality: N/A;   SKIN GRAFT     TEE WITHOUT CARDIOVERSION N/A 06/25/2023   Procedure: TRANSESOPHAGEAL ECHOCARDIOGRAM;  Surgeon: Kerrin Elspeth BROCKS, MD;  Location: [facility] OR;  Service: Open Heart Surgery;  Laterality: N/A;   VENTRICULAR ASSIST DEVICE INSERTION N/A 07/13/2017   Procedure: Ventricular Assist Device Insertion;  Surgeon: Ladona Heinz, MD;  Location: [facility] INVASIVE CV LAB;  Service: Cardiovascular;  Laterality: N/A;    No Known Allergies  Objective/Physical Exam Neurovascular status intact.  Skin incisions are well coapted and healed with the exception of a small focal absorbable stitch protruding out of the incision site of the second toe DIPJ.  There is some localized erythema and edema around the area as well  Radiographic Exam RT foot 10/15/2024:  Arthroplasty noted to the DIPJ of the 2nd and 4th digit right foot without complication  Assessment: 1. s/p DIPJ arthroplasty 2nd and 4th digit right. DOS: 10/09/2024   Plan of Care:  -Patient was evaluated.  - Due to the sensitivity of the toe I did decide to anesthetize the toe with a digital block  of 2% lidocaine  plain totaling 3 mL in order to debride and remove the observable suture.  After the toe was anesthetized the suture was removed using a tissue nipper.  No purulence.  No heavy bleeding.  Tolerated well -Recommend triple antibiotic and a Band-Aid daily over the area -Due to the erythema around the distal end of the toe I did go ahead and call in a prescription for doxycycline  100 mg twice daily x 10 days -Return to clinic 1 week.  He is already scheduled for next week in the [facility] office  Thresa EMERSON Janit, DPM [facility]  Dr. Thresa EMERSON Janit, DPM    [address] [place] [address]                Office [phone]  Fax [phone]

## 2024-11-12 ENCOUNTER — Other Ambulatory Visit: Payer: Self-pay | Admitting: Cardiology

## 2024-11-12 ENCOUNTER — Other Ambulatory Visit: Payer: Self-pay

## 2024-11-12 DIAGNOSIS — E039 Hypothyroidism, unspecified: Secondary | ICD-10-CM

## 2024-11-13 ENCOUNTER — Other Ambulatory Visit: Payer: Self-pay

## 2024-11-13 DIAGNOSIS — E039 Hypothyroidism, unspecified: Secondary | ICD-10-CM

## 2024-11-13 MED FILL — Levothyroxine Sodium Tab 175 MCG: ORAL | 60 days supply | Qty: 60 | Fill #0 | Status: AC

## 2024-11-17 ENCOUNTER — Other Ambulatory Visit: Payer: Self-pay

## 2024-11-19 ENCOUNTER — Encounter: Admitting: Podiatry

## 2024-11-25 ENCOUNTER — Telehealth: Payer: Self-pay | Admitting: Cardiology

## 2024-11-25 ENCOUNTER — Other Ambulatory Visit: Payer: Self-pay

## 2024-11-25 ENCOUNTER — Telehealth: Payer: Self-pay

## 2024-11-25 ENCOUNTER — Other Ambulatory Visit (HOSPITAL_COMMUNITY): Payer: Self-pay

## 2024-11-25 DIAGNOSIS — E039 Hypothyroidism, unspecified: Secondary | ICD-10-CM

## 2024-11-25 DIAGNOSIS — I480 Paroxysmal atrial fibrillation: Secondary | ICD-10-CM

## 2024-11-25 MED ORDER — TORSEMIDE 20 MG PO TABS
ORAL_TABLET | ORAL | 3 refills | Status: DC
  Filled 2024-11-25: qty 100, 50d supply, fill #0

## 2024-11-25 MED ORDER — AMIODARONE HCL 200 MG PO TABS
200.0000 mg | ORAL_TABLET | Freq: Every evening | ORAL | 3 refills | Status: AC
  Filled 2024-11-25 – 2024-12-10 (×2): qty 90, 90d supply, fill #0

## 2024-11-25 MED ORDER — LEVOTHYROXINE SODIUM 175 MCG PO TABS
175.0000 ug | ORAL_TABLET | Freq: Every day | ORAL | 3 refills | Status: AC
  Filled 2024-11-25 – 2025-01-09 (×2): qty 90, 90d supply, fill #0

## 2024-11-25 MED ORDER — DIGOXIN 125 MCG PO TABS
0.1250 mg | ORAL_TABLET | Freq: Every day | ORAL | 3 refills | Status: AC
  Filled 2024-11-25 – 2024-12-15 (×2): qty 90, 90d supply, fill #0

## 2024-11-25 MED ORDER — WARFARIN SODIUM 5 MG PO TABS
ORAL_TABLET | ORAL | 1 refills | Status: AC
  Filled 2024-11-25: qty 90, 90d supply, fill #0

## 2024-11-25 NOTE — Addendum Note (Signed)
 Addended by: Danuel Felicetti on: 11/25/2024 11:30 AM   Modules accepted: Orders

## 2024-11-25 NOTE — Telephone Encounter (Signed)
*  STAT* If patient is at the pharmacy, call can be transferred to refill team.   1. Which medications need to be refilled? (please list name of each medication and dose if known)   warfarin (COUMADIN ) 5 MG tablet     4. Which pharmacy/location (including street and city if local pharmacy) is medication to be sent to?  [facility] Phone: [phone]  Fax: [phone]       5. Do they need a 30 day or 90 day supply? 90

## 2024-11-25 NOTE — Telephone Encounter (Signed)
 Copied from CRM #[id number]. Topic: Clinical - Medication Question >> Nov 25, 2024  9:13 AM Olam RAMAN wrote: Reason for CRM: pt wife asked if levothyroxine  (SYNTHROID ) 175 MCG tablet can be resent to pharmacy since he was sent 60 and needs to be sent 90 days per insurance before the end of the year. Cb [phone]  [phone]

## 2024-11-26 ENCOUNTER — Ambulatory Visit: Attending: Cardiovascular Disease

## 2024-11-26 DIAGNOSIS — I493 Ventricular premature depolarization: Secondary | ICD-10-CM

## 2024-11-26 DIAGNOSIS — I255 Ischemic cardiomyopathy: Secondary | ICD-10-CM | POA: Diagnosis not present

## 2024-11-26 DIAGNOSIS — Z951 Presence of aortocoronary bypass graft: Secondary | ICD-10-CM

## 2024-11-26 DIAGNOSIS — I480 Paroxysmal atrial fibrillation: Secondary | ICD-10-CM | POA: Diagnosis not present

## 2024-11-26 DIAGNOSIS — I25110 Atherosclerotic heart disease of native coronary artery with unstable angina pectoris: Secondary | ICD-10-CM

## 2024-11-26 DIAGNOSIS — I5042 Chronic combined systolic (congestive) and diastolic (congestive) heart failure: Secondary | ICD-10-CM

## 2024-11-26 DIAGNOSIS — Z5181 Encounter for therapeutic drug level monitoring: Secondary | ICD-10-CM

## 2024-11-26 DIAGNOSIS — I5020 Unspecified systolic (congestive) heart failure: Secondary | ICD-10-CM

## 2024-11-26 LAB — POCT INR: INR: 2.7 (ref 2.0–3.0)

## 2024-11-26 NOTE — Patient Instructions (Signed)
 Continue 2.5 mg  DAILY, EXCEPT 5 mg EVERY MONDAY, WEDNESDAY and FRIDAY.  INR in 6 weeks. [phone]

## 2024-12-05 ENCOUNTER — Telehealth: Payer: Self-pay

## 2024-12-05 NOTE — Telephone Encounter (Signed)
 Copied from CRM #[id number]. Topic: Appointments - Transfer of Care >> Dec 04, 2024 10:12 AM Rilla B wrote: Pt is requesting to transfer FROM: Comer Rouleau Pt is requesting to transfer TO: Almarie Ferrari Reason for requested transfer: K Cobb leaving It is the responsibility of the team the patient would like to transfer to (Dr. Ferrari) to reach out to the patient if for any reason this transfer is not acceptable. *Requested [place] location as it's close to his job.   Appt has already been scheduled with [facility] in March 2026.

## 2024-12-08 ENCOUNTER — Ambulatory Visit: Admitting: Nurse Practitioner

## 2024-12-08 ENCOUNTER — Ambulatory Visit: Admitting: Dermatology

## 2024-12-10 ENCOUNTER — Other Ambulatory Visit: Payer: Self-pay

## 2024-12-11 ENCOUNTER — Encounter

## 2024-12-15 ENCOUNTER — Other Ambulatory Visit (HOSPITAL_COMMUNITY): Payer: Self-pay

## 2024-12-15 ENCOUNTER — Other Ambulatory Visit: Payer: Self-pay

## 2024-12-15 ENCOUNTER — Ambulatory Visit: Admitting: Nurse Practitioner

## 2024-12-16 ENCOUNTER — Ambulatory Visit

## 2024-12-16 DIAGNOSIS — I480 Paroxysmal atrial fibrillation: Secondary | ICD-10-CM | POA: Diagnosis not present

## 2024-12-17 LAB — CUP PACEART REMOTE DEVICE CHECK
Battery Remaining Longevity: 156 mo
Battery Remaining Percentage: 100 %
Brady Statistic RA Percent Paced: 1 %
Brady Statistic RV Percent Paced: 0 %
Date Time Interrogation Session: 20251223013100
HighPow Impedance: 59 Ohm
Implantable Lead Connection Status: 753985
Implantable Lead Connection Status: 753985
Implantable Lead Implant Date: 20250806
Implantable Lead Implant Date: 20250806
Implantable Lead Location: 753859
Implantable Lead Location: 753860
Implantable Lead Model: 673
Implantable Lead Model: 7841
Implantable Lead Serial Number: 1632766
Implantable Lead Serial Number: 270735
Implantable Pulse Generator Implant Date: 20250806
Lead Channel Impedance Value: 421 Ohm
Lead Channel Impedance Value: 482 Ohm
Lead Channel Pacing Threshold Amplitude: 0.5 V
Lead Channel Pacing Threshold Amplitude: 0.9 V
Lead Channel Pacing Threshold Pulse Width: 0.4 ms
Lead Channel Pacing Threshold Pulse Width: 0.4 ms
Lead Channel Setting Pacing Amplitude: 2 V
Lead Channel Setting Pacing Amplitude: 2 V
Lead Channel Setting Pacing Pulse Width: 0.4 ms
Lead Channel Setting Sensing Sensitivity: 0.5 mV
Pulse Gen Serial Number: 702869
Zone Setting Status: 755011

## 2024-12-19 NOTE — Progress Notes (Signed)
 Remote ICD Transmission

## 2024-12-21 ENCOUNTER — Other Ambulatory Visit: Payer: Self-pay | Admitting: Family

## 2024-12-21 ENCOUNTER — Ambulatory Visit: Payer: Self-pay | Admitting: Cardiology

## 2024-12-22 ENCOUNTER — Other Ambulatory Visit: Payer: Self-pay

## 2024-12-22 MED ORDER — MEXILETINE HCL 150 MG PO CAPS
150.0000 mg | ORAL_CAPSULE | Freq: Two times a day (BID) | ORAL | 1 refills | Status: AC
  Filled 2024-12-22: qty 180, 90d supply, fill #0

## 2025-01-07 ENCOUNTER — Ambulatory Visit

## 2025-01-09 ENCOUNTER — Other Ambulatory Visit: Payer: Self-pay

## 2025-01-09 ENCOUNTER — Other Ambulatory Visit (HOSPITAL_COMMUNITY): Payer: Self-pay

## 2025-01-12 ENCOUNTER — Other Ambulatory Visit (HOSPITAL_COMMUNITY): Payer: Self-pay

## 2025-01-14 ENCOUNTER — Ambulatory Visit

## 2025-01-14 DIAGNOSIS — I25110 Atherosclerotic heart disease of native coronary artery with unstable angina pectoris: Secondary | ICD-10-CM

## 2025-01-14 DIAGNOSIS — I5042 Chronic combined systolic (congestive) and diastolic (congestive) heart failure: Secondary | ICD-10-CM

## 2025-01-14 DIAGNOSIS — I480 Paroxysmal atrial fibrillation: Secondary | ICD-10-CM

## 2025-01-14 DIAGNOSIS — I255 Ischemic cardiomyopathy: Secondary | ICD-10-CM | POA: Diagnosis not present

## 2025-01-14 DIAGNOSIS — Z951 Presence of aortocoronary bypass graft: Secondary | ICD-10-CM

## 2025-01-14 DIAGNOSIS — I493 Ventricular premature depolarization: Secondary | ICD-10-CM

## 2025-01-14 DIAGNOSIS — Z5181 Encounter for therapeutic drug level monitoring: Secondary | ICD-10-CM

## 2025-01-14 DIAGNOSIS — I5020 Unspecified systolic (congestive) heart failure: Secondary | ICD-10-CM

## 2025-01-14 LAB — POCT INR: INR: 1.8 — AB (ref 2.0–3.0)

## 2025-01-14 NOTE — Patient Instructions (Signed)
 Take 1.5 tablets today only then Continue 2.5 mg  DAILY, EXCEPT 5 mg EVERY MONDAY, WEDNESDAY and FRIDAY.  INR in 6 weeks. [phone]

## 2025-01-26 ENCOUNTER — Other Ambulatory Visit
Admission: RE | Admit: 2025-01-26 | Discharge: 2025-01-26 | Disposition: A | Source: Ambulatory Visit | Attending: Cardiology | Admitting: Cardiology

## 2025-01-26 ENCOUNTER — Ambulatory Visit: Admitting: Cardiology

## 2025-01-26 ENCOUNTER — Telehealth: Payer: Self-pay

## 2025-01-26 ENCOUNTER — Other Ambulatory Visit (HOSPITAL_COMMUNITY): Payer: Self-pay

## 2025-01-26 ENCOUNTER — Encounter: Payer: Self-pay | Admitting: Cardiology

## 2025-01-26 VITALS — BP 92/58 | HR 67 | Ht 73.0 in | Wt 229.0 lb

## 2025-01-26 DIAGNOSIS — I5022 Chronic systolic (congestive) heart failure: Secondary | ICD-10-CM | POA: Diagnosis not present

## 2025-01-26 DIAGNOSIS — Z95810 Presence of automatic (implantable) cardiac defibrillator: Secondary | ICD-10-CM

## 2025-01-26 DIAGNOSIS — I255 Ischemic cardiomyopathy: Secondary | ICD-10-CM | POA: Diagnosis not present

## 2025-01-26 DIAGNOSIS — I480 Paroxysmal atrial fibrillation: Secondary | ICD-10-CM

## 2025-01-26 DIAGNOSIS — D6859 Other primary thrombophilia: Secondary | ICD-10-CM | POA: Diagnosis not present

## 2025-01-26 DIAGNOSIS — I25110 Atherosclerotic heart disease of native coronary artery with unstable angina pectoris: Secondary | ICD-10-CM

## 2025-01-26 DIAGNOSIS — I472 Ventricular tachycardia, unspecified: Secondary | ICD-10-CM | POA: Diagnosis not present

## 2025-01-26 LAB — TSH: TSH: 2.33 u[IU]/mL (ref 0.350–4.500)

## 2025-01-26 LAB — CBC
HCT: 32.7 % — ABNORMAL LOW (ref 39.0–52.0)
Hemoglobin: 9.7 g/dL — ABNORMAL LOW (ref 13.0–17.0)
MCH: 22.4 pg — ABNORMAL LOW (ref 26.0–34.0)
MCHC: 29.7 g/dL — ABNORMAL LOW (ref 30.0–36.0)
MCV: 75.5 fL — ABNORMAL LOW (ref 80.0–100.0)
Platelets: 275 10*3/uL (ref 150–400)
RBC: 4.33 MIL/uL (ref 4.22–5.81)
RDW: 19.9 % — ABNORMAL HIGH (ref 11.5–15.5)
WBC: 10.5 10*3/uL (ref 4.0–10.5)
nRBC: 0 % (ref 0.0–0.2)

## 2025-01-26 LAB — COMPREHENSIVE METABOLIC PANEL WITH GFR
ALT: 32 U/L (ref 0–44)
AST: 31 U/L (ref 15–41)
Albumin: 4.2 g/dL (ref 3.5–5.0)
Alkaline Phosphatase: 100 U/L (ref 38–126)
Anion gap: 11 (ref 5–15)
BUN: 21 mg/dL — ABNORMAL HIGH (ref 6–20)
CO2: 28 mmol/L (ref 22–32)
Calcium: 9.1 mg/dL (ref 8.9–10.3)
Chloride: 103 mmol/L (ref 98–111)
Creatinine, Ser: 1.52 mg/dL — ABNORMAL HIGH (ref 0.61–1.24)
GFR, Estimated: 54 mL/min — ABNORMAL LOW
Glucose, Bld: 106 mg/dL — ABNORMAL HIGH (ref 70–99)
Potassium: 4.3 mmol/L (ref 3.5–5.1)
Sodium: 141 mmol/L (ref 135–145)
Total Bilirubin: 0.3 mg/dL (ref 0.0–1.2)
Total Protein: 6.5 g/dL (ref 6.5–8.1)

## 2025-01-26 LAB — MAGNESIUM: Magnesium: 2.4 mg/dL (ref 1.7–2.4)

## 2025-01-26 LAB — PROTIME-INR
INR: 1.8 — ABNORMAL HIGH (ref 0.8–1.2)
Prothrombin Time: 21.9 s — ABNORMAL HIGH (ref 11.4–15.2)

## 2025-01-26 LAB — PRO BRAIN NATRIURETIC PEPTIDE: Pro Brain Natriuretic Peptide: 971 pg/mL — ABNORMAL HIGH

## 2025-01-26 LAB — T4, FREE: Free T4: 1.38 ng/dL (ref 0.80–2.00)

## 2025-01-26 NOTE — Telephone Encounter (Signed)
 Alert remote transmission:  Antitachycardia pacing (ATP) therapy delivered to convert arrhythmia. Ventricular shock therapy delivered to convert arrhythmia. VF event occurred 1/31 @ 17:15, duration 50sec, HR 238, V>A, ATP x1 followed by 41J converting arrhythmia  Patient did not answer, was able to get wife on phone (okay per DPR).   Per wife:   Patient was doing yard work at that time.  Felt SOB, faint and bent over to hold knees at time of event.  Did not loose consciousness.  Has been okay since just feeling weak and fatigued. In bed resting today per wife.   Appointment made today in [place] at 245pm w/ Chantal Needle, NP.   [place] DMV driving restrictions given x 6months to wife to inform husband and can discuss further with Suzann at appt today.   Also informed wife, if patient becomes symptomatic or has another shock before appt today, should call 911 and go to the hospital.  Wife verbalizes understanding.

## 2025-01-27 ENCOUNTER — Ambulatory Visit: Payer: Self-pay | Admitting: Cardiology

## 2025-01-27 DIAGNOSIS — D509 Iron deficiency anemia, unspecified: Secondary | ICD-10-CM

## 2025-01-27 DIAGNOSIS — Z7901 Long term (current) use of anticoagulants: Secondary | ICD-10-CM

## 2025-01-28 DIAGNOSIS — D509 Iron deficiency anemia, unspecified: Secondary | ICD-10-CM | POA: Insufficient documentation

## 2025-01-28 DIAGNOSIS — Z7901 Long term (current) use of anticoagulants: Secondary | ICD-10-CM | POA: Insufficient documentation

## 2025-01-28 NOTE — Progress Notes (Signed)
 Please call the patient recommend appointment to go through recent lab done at cardiology clinic to evaluate cause for anemia. At the meantime if he has been taking NSAIDs like ibuprofen , naproxen please recommend him holding off on taking these medication. If he has black, tarry stools, bright red blood in stool, abdominal pain recommend ED evaluation otherwise follow up with me in 1-2 weeks.   Luke Shade, MD

## 2025-01-28 NOTE — Progress Notes (Signed)
 Last read by Alyce Pinal at 12:58PM on 01/28/2025.

## 2025-01-28 NOTE — Addendum Note (Signed)
 Addended by: Miasia Crabtree on: 01/28/2025 12:55 PM   Modules accepted: Orders

## 2025-01-28 NOTE — Telephone Encounter (Signed)
 As long as he is asymptomatic okay to keep appointment for March. Recommend repeat CBC, check iron panel, vitamin B12 before his visit with me in March. Future lab ordered.    1. Microcytic hypochromic anemia (Primary) - CBC w/Diff; Future - B12; Future - Iron, TIBC and Ferritin Panel; Future  2. On warfarin therapy - CBC w/Diff; Future - B12; Future - Iron, TIBC and Ferritin Panel; Future   Luke Shade, MD

## 2025-01-29 ENCOUNTER — Telehealth: Payer: Self-pay | Admitting: *Deleted

## 2025-01-29 NOTE — Telephone Encounter (Signed)
 Cardiac Catheterization scheduled at [facility] for: Friday January 30, 2025 11 AM Arrival time [facility] Entrance at: 10 AM [address] [place] [phone]  Diet: -Nothing to eat after midnight.  Hydration: -May drink clear liquids until 2 hours ( 9 AM) before the procedure.  Approved liquids: Water , clear tea, black coffee, fruit juices-non-citric and without pulp,Gatorade, plain Jello/popsicles.   -Please drink 8 oz of water  2 hours before procedure.  Medication instructions: -Hold:  Warfarin-pt tells me he did not take starting 01/26/25 and knows to hold until after procedure  Metformin -day of procedure and 48 hours after procedure  Farxiga /Torsemide /Spironolactone -AM of procedure -Other usual morning medications can be taken including aspirin  81 mg.  Plan to go home the same day, you will only stay overnight if medically necessary.  You must have responsible adult to drive you home.  Someone must be with you the first 24 hours after you arrive home.  Reviewed procedure instructions with patient.

## 2025-01-30 ENCOUNTER — Encounter: Admission: RE | Disposition: A | Payer: Self-pay | Attending: Internal Medicine

## 2025-01-30 ENCOUNTER — Other Ambulatory Visit: Payer: Self-pay

## 2025-01-30 ENCOUNTER — Ambulatory Visit
Admission: RE | Admit: 2025-01-30 | Discharge: 2025-01-30 | Disposition: A | Attending: Internal Medicine | Admitting: Internal Medicine

## 2025-01-30 ENCOUNTER — Encounter: Payer: Self-pay | Admitting: Internal Medicine

## 2025-01-30 DIAGNOSIS — I5023 Acute on chronic systolic (congestive) heart failure: Secondary | ICD-10-CM | POA: Diagnosis present

## 2025-01-30 DIAGNOSIS — I472 Ventricular tachycardia, unspecified: Secondary | ICD-10-CM | POA: Diagnosis present

## 2025-01-30 DIAGNOSIS — Z01812 Encounter for preprocedural laboratory examination: Secondary | ICD-10-CM

## 2025-01-30 DIAGNOSIS — I480 Paroxysmal atrial fibrillation: Secondary | ICD-10-CM

## 2025-01-30 LAB — POCT I-STAT EG7
Acid-base deficit: 2 mmol/L (ref 0.0–2.0)
Bicarbonate: 23.3 mmol/L (ref 20.0–28.0)
Calcium, Ion: 1.18 mmol/L (ref 1.15–1.40)
HCT: 31 % — ABNORMAL LOW (ref 39.0–52.0)
Hemoglobin: 10.5 g/dL — ABNORMAL LOW (ref 13.0–17.0)
O2 Saturation: 55 %
Potassium: 4.1 mmol/L (ref 3.5–5.1)
Sodium: 135 mmol/L (ref 135–145)
TCO2: 25 mmol/L (ref 22–32)
pCO2, Ven: 41.7 mmHg — ABNORMAL LOW (ref 44–60)
pH, Ven: 7.356 (ref 7.25–7.43)
pO2, Ven: 30 mmHg — CL (ref 32–45)

## 2025-01-30 LAB — PROTIME-INR
INR: 1.1 (ref 0.8–1.2)
Prothrombin Time: 14.8 s (ref 11.4–15.2)

## 2025-01-30 LAB — POCT I-STAT 7, (LYTES, BLD GAS, ICA,H+H)
Acid-base deficit: 1 mmol/L (ref 0.0–2.0)
Bicarbonate: 23.7 mmol/L (ref 20.0–28.0)
Calcium, Ion: 1.21 mmol/L (ref 1.15–1.40)
HCT: 30 % — ABNORMAL LOW (ref 39.0–52.0)
Hemoglobin: 10.2 g/dL — ABNORMAL LOW (ref 13.0–17.0)
O2 Saturation: 95 %
Potassium: 4.4 mmol/L (ref 3.5–5.1)
Sodium: 140 mmol/L (ref 135–145)
TCO2: 25 mmol/L (ref 22–32)
pCO2 arterial: 36.5 mmHg (ref 32–48)
pH, Arterial: 7.421 (ref 7.35–7.45)
pO2, Arterial: 75 mmHg — ABNORMAL LOW (ref 83–108)

## 2025-01-30 MED ORDER — FUROSEMIDE 10 MG/ML IJ SOLN: 80.0000 mg | Freq: Once | INTRAMUSCULAR | Status: AC

## 2025-01-30 MED ORDER — POTASSIUM CHLORIDE CRYS ER 20 MEQ PO TBCR
20.0000 meq | EXTENDED_RELEASE_TABLET | Freq: Every day | ORAL | 1 refills | Status: AC
  Filled 2025-01-30: qty 30, 30d supply, fill #0

## 2025-01-30 MED ORDER — MIDAZOLAM HCL (PF) 2 MG/2ML IJ SOLN
INTRAMUSCULAR | Status: DC | PRN
  Administered 2025-01-30: 1 mg via INTRAVENOUS

## 2025-01-30 MED ORDER — SODIUM CHLORIDE 0.9% FLUSH: 3.0000 mL | INTRAVENOUS | Status: DC | PRN

## 2025-01-30 MED ORDER — VERAPAMIL HCL 2.5 MG/ML IV SOLN
INTRAVENOUS | Status: AC
  Filled 2025-01-30: qty 2

## 2025-01-30 MED ORDER — LIDOCAINE HCL (PF) 1 % IJ SOLN
INTRAMUSCULAR | Status: DC | PRN
  Administered 2025-01-30 (×2): 5 mL via SUBCUTANEOUS

## 2025-01-30 MED ORDER — SODIUM CHLORIDE 0.9% FLUSH: 3.0000 mL | Freq: Two times a day (BID) | INTRAVENOUS | Status: DC

## 2025-01-30 MED ORDER — ONDANSETRON HCL 4 MG/2ML IJ SOLN: 4.0000 mg | Freq: Four times a day (QID) | INTRAMUSCULAR | Status: DC | PRN

## 2025-01-30 MED ORDER — HEPARIN SODIUM (PORCINE) 1000 UNIT/ML IJ SOLN
INTRAMUSCULAR | Status: AC
  Filled 2025-01-30: qty 10

## 2025-01-30 MED ORDER — HYDRALAZINE HCL 20 MG/ML IJ SOLN: 10.0000 mg | INTRAMUSCULAR | Status: DC | PRN

## 2025-01-30 MED ORDER — TORSEMIDE 20 MG PO TABS: 60.0000 mg | ORAL_TABLET | Freq: Two times a day (BID) | ORAL | Status: AC

## 2025-01-30 MED ORDER — MIDAZOLAM HCL 2 MG/2ML IJ SOLN
INTRAMUSCULAR | Status: AC
  Filled 2025-01-30: qty 2

## 2025-01-30 MED ORDER — VERAPAMIL HCL 2.5 MG/ML IV SOLN
INTRAVENOUS | Status: DC | PRN
  Administered 2025-01-30 (×2): 2.5 mg via INTRAVENOUS

## 2025-01-30 MED ORDER — HEPARIN (PORCINE) IN NACL 2000-0.9 UNIT/L-% IV SOLN: INTRAVENOUS | Status: DC | PRN

## 2025-01-30 MED ORDER — HEPARIN (PORCINE) IN NACL 1000-0.9 UT/500ML-% IV SOLN
INTRAVENOUS | Status: DC | PRN
  Administered 2025-01-30 (×2): 500 mL

## 2025-01-30 MED ORDER — FENTANYL CITRATE (PF) 100 MCG/2ML IJ SOLN
INTRAMUSCULAR | Status: AC
  Filled 2025-01-30: qty 2

## 2025-01-30 MED ORDER — HEPARIN SODIUM (PORCINE) 1000 UNIT/ML IJ SOLN
INTRAMUSCULAR | Status: DC | PRN
  Administered 2025-01-30: 5000 [IU] via INTRAVENOUS

## 2025-01-30 MED ORDER — FUROSEMIDE 10 MG/ML IJ SOLN
INTRAMUSCULAR | Status: AC
  Administered 2025-01-30: 80 mg via INTRAVENOUS
  Filled 2025-01-30: qty 8

## 2025-01-30 MED ORDER — FREE WATER: 250.0000 mL | Freq: Once | Status: DC

## 2025-01-30 MED ORDER — IOHEXOL 300 MG/ML  SOLN
INTRAMUSCULAR | Status: DC | PRN
  Administered 2025-01-30: 36 mL

## 2025-01-30 MED ORDER — LIDOCAINE HCL 1 % IJ SOLN
INTRAMUSCULAR | Status: AC
  Filled 2025-01-30: qty 20

## 2025-01-30 MED ORDER — SODIUM CHLORIDE 0.9 % IV SOLN: 250.0000 mL | INTRAVENOUS | Status: DC | PRN

## 2025-01-30 MED ORDER — HEPARIN (PORCINE) IN NACL 1000-0.9 UT/500ML-% IV SOLN
INTRAVENOUS | Status: AC
  Filled 2025-01-30: qty 1000

## 2025-01-30 MED ORDER — FENTANYL CITRATE (PF) 100 MCG/2ML IJ SOLN
INTRAMUSCULAR | Status: DC | PRN
  Administered 2025-01-30: 25 ug via INTRAVENOUS

## 2025-01-30 MED ORDER — ASPIRIN 81 MG PO CHEW: 81.0000 mg | CHEWABLE_TABLET | ORAL | Status: DC

## 2025-01-30 MED ORDER — ACETAMINOPHEN 325 MG PO TABS: 650.0000 mg | ORAL_TABLET | ORAL | Status: DC | PRN

## 2025-01-30 NOTE — Interval H&P Note (Signed)
 History and Physical Interval Note:  01/30/2025 11:25 AM  Bobby [name]  has presented today for surgery, with the diagnosis of coronary artery disease and HFrEF with VT/ICD shock.  The various methods of treatment have been discussed with the patient and family. After consideration of risks, benefits and other options for treatment, the patient has consented to  Procedures: RIGHT/LEFT HEART CATH AND CORONARY/GRAFT ANGIOGRAPHY (N/A) as a surgical intervention.  The patient's history has been reviewed, patient examined, no change in status, stable for surgery.  I have reviewed the patient's chart and labs.  Questions were answered to the patient's satisfaction.    Cath Lab Visit (complete for each Cath Lab visit)  Clinical Evaluation Leading to the Procedure:   ACS: No.  Non-ACS:    Anginal Classification: CCS III  Anti-ischemic medical therapy: Minimal Therapy (1 class of medications)  Non-Invasive Test Results: LVEF 30-35% by echo -> high risk  Prior CABG: Previous CABG  Bobby [name]

## 2025-02-05 ENCOUNTER — Ambulatory Visit: Admitting: Internal Medicine

## 2025-02-11 ENCOUNTER — Ambulatory Visit

## 2025-02-23 ENCOUNTER — Ambulatory Visit: Admitting: Cardiology

## 2025-02-25 ENCOUNTER — Ambulatory Visit

## 2025-03-05 ENCOUNTER — Ambulatory Visit

## 2025-03-12 ENCOUNTER — Encounter

## 2025-03-16 ENCOUNTER — Ambulatory Visit: Admitting: Primary Care

## 2025-03-17 ENCOUNTER — Encounter

## 2025-06-10 ENCOUNTER — Ambulatory Visit: Admitting: Dermatology

## 2025-06-11 ENCOUNTER — Encounter

## 2025-06-16 ENCOUNTER — Encounter

## 2025-09-10 ENCOUNTER — Encounter
